# Patient Record
Sex: Female | Born: 1940 | Race: Black or African American | Hispanic: No | Marital: Single | Smoking: Former smoker
Health system: Southern US, Community
[De-identification: ages and names within clinical notes are randomized; demographics above are authoritative.]

## PROBLEM LIST (undated history)

## (undated) DIAGNOSIS — N289 Disorder of kidney and ureter, unspecified: Secondary | ICD-10-CM

## (undated) DIAGNOSIS — E785 Hyperlipidemia, unspecified: Secondary | ICD-10-CM

## (undated) DIAGNOSIS — I2510 Atherosclerotic heart disease of native coronary artery without angina pectoris: Secondary | ICD-10-CM

## (undated) DIAGNOSIS — C801 Malignant (primary) neoplasm, unspecified: Secondary | ICD-10-CM

## (undated) DIAGNOSIS — J189 Pneumonia, unspecified organism: Secondary | ICD-10-CM

## (undated) DIAGNOSIS — F419 Anxiety disorder, unspecified: Secondary | ICD-10-CM

## (undated) DIAGNOSIS — K589 Irritable bowel syndrome without diarrhea: Secondary | ICD-10-CM

## (undated) DIAGNOSIS — M1990 Unspecified osteoarthritis, unspecified site: Secondary | ICD-10-CM

## (undated) DIAGNOSIS — K224 Dyskinesia of esophagus: Secondary | ICD-10-CM

## (undated) DIAGNOSIS — I739 Peripheral vascular disease, unspecified: Secondary | ICD-10-CM

## (undated) DIAGNOSIS — G629 Polyneuropathy, unspecified: Secondary | ICD-10-CM

## (undated) DIAGNOSIS — I10 Essential (primary) hypertension: Secondary | ICD-10-CM

## (undated) DIAGNOSIS — F209 Schizophrenia, unspecified: Secondary | ICD-10-CM

## (undated) DIAGNOSIS — K219 Gastro-esophageal reflux disease without esophagitis: Secondary | ICD-10-CM

## (undated) DIAGNOSIS — E119 Type 2 diabetes mellitus without complications: Secondary | ICD-10-CM

## (undated) DIAGNOSIS — D509 Iron deficiency anemia, unspecified: Secondary | ICD-10-CM

## (undated) DIAGNOSIS — R55 Syncope and collapse: Secondary | ICD-10-CM

## (undated) DIAGNOSIS — F0390 Unspecified dementia without behavioral disturbance: Secondary | ICD-10-CM

## (undated) HISTORY — PX: JOINT REPLACEMENT: SHX530

## (undated) HISTORY — DX: Dyskinesia of esophagus: K22.4

## (undated) HISTORY — DX: Schizophrenia, unspecified: F20.9

## (undated) HISTORY — DX: Iron deficiency anemia, unspecified: D50.9

## (undated) HISTORY — DX: Syncope and collapse: R55

## (undated) HISTORY — DX: Type 2 diabetes mellitus without complications: E11.9

## (undated) HISTORY — DX: Atherosclerotic heart disease of native coronary artery without angina pectoris: I25.10

## (undated) HISTORY — PX: BREAST BIOPSY: SHX20

## (undated) HISTORY — DX: Polyneuropathy, unspecified: G62.9

## (undated) HISTORY — DX: Essential (primary) hypertension: I10

## (undated) HISTORY — DX: Irritable bowel syndrome, unspecified: K58.9

## (undated) HISTORY — DX: Disorder of kidney and ureter, unspecified: N28.9

## (undated) HISTORY — DX: Unspecified dementia, unspecified severity, without behavioral disturbance, psychotic disturbance, mood disturbance, and anxiety: F03.90

## (undated) HISTORY — PX: ABDOMINAL HYSTERECTOMY: SHX81

## (undated) HISTORY — PX: KNEE ARTHROPLASTY: SHX992

## (undated) HISTORY — DX: Peripheral vascular disease, unspecified: I73.9

## (undated) HISTORY — DX: Anxiety disorder, unspecified: F41.9

## (undated) HISTORY — DX: Gastro-esophageal reflux disease without esophagitis: K21.9

## (undated) HISTORY — DX: Hyperlipidemia, unspecified: E78.5

---

## 1993-04-22 HISTORY — PX: PTCA: SHX146

## 1997-06-24 ENCOUNTER — Ambulatory Visit (HOSPITAL_COMMUNITY): Admission: RE | Admit: 1997-06-24 | Discharge: 1997-06-24 | Payer: Self-pay | Admitting: Obstetrics and Gynecology

## 1998-04-27 ENCOUNTER — Other Ambulatory Visit: Admission: RE | Admit: 1998-04-27 | Discharge: 1998-04-27 | Payer: Self-pay | Admitting: Obstetrics and Gynecology

## 1998-06-26 ENCOUNTER — Ambulatory Visit (HOSPITAL_COMMUNITY): Admission: RE | Admit: 1998-06-26 | Discharge: 1998-06-26 | Payer: Self-pay

## 1998-06-26 ENCOUNTER — Encounter: Payer: Self-pay | Admitting: Obstetrics and Gynecology

## 1999-04-23 ENCOUNTER — Encounter: Payer: Self-pay | Admitting: Internal Medicine

## 1999-06-28 ENCOUNTER — Ambulatory Visit (HOSPITAL_COMMUNITY): Admission: RE | Admit: 1999-06-28 | Discharge: 1999-06-28 | Payer: Self-pay | Admitting: Obstetrics and Gynecology

## 1999-06-28 ENCOUNTER — Encounter: Payer: Self-pay | Admitting: Obstetrics and Gynecology

## 1999-07-13 ENCOUNTER — Encounter: Admission: RE | Admit: 1999-07-13 | Discharge: 1999-10-11 | Payer: Self-pay | Admitting: Internal Medicine

## 2000-04-29 ENCOUNTER — Other Ambulatory Visit: Admission: RE | Admit: 2000-04-29 | Discharge: 2000-04-29 | Payer: Self-pay | Admitting: Obstetrics and Gynecology

## 2000-06-30 ENCOUNTER — Encounter: Payer: Self-pay | Admitting: Obstetrics and Gynecology

## 2000-06-30 ENCOUNTER — Ambulatory Visit (HOSPITAL_COMMUNITY): Admission: RE | Admit: 2000-06-30 | Discharge: 2000-06-30 | Payer: Self-pay | Admitting: Obstetrics and Gynecology

## 2001-05-05 ENCOUNTER — Other Ambulatory Visit: Admission: RE | Admit: 2001-05-05 | Discharge: 2001-05-05 | Payer: Self-pay | Admitting: Obstetrics and Gynecology

## 2001-07-01 ENCOUNTER — Encounter: Payer: Self-pay | Admitting: Obstetrics and Gynecology

## 2001-07-01 ENCOUNTER — Ambulatory Visit (HOSPITAL_COMMUNITY): Admission: RE | Admit: 2001-07-01 | Discharge: 2001-07-01 | Payer: Self-pay | Admitting: Obstetrics and Gynecology

## 2002-07-28 ENCOUNTER — Ambulatory Visit (HOSPITAL_COMMUNITY): Admission: RE | Admit: 2002-07-28 | Discharge: 2002-07-28 | Payer: Self-pay | Admitting: Obstetrics and Gynecology

## 2002-07-28 ENCOUNTER — Encounter: Payer: Self-pay | Admitting: Obstetrics and Gynecology

## 2002-10-14 ENCOUNTER — Encounter: Payer: Self-pay | Admitting: Orthopedic Surgery

## 2002-10-19 ENCOUNTER — Inpatient Hospital Stay (HOSPITAL_COMMUNITY): Admission: RE | Admit: 2002-10-19 | Discharge: 2002-10-25 | Payer: Self-pay | Admitting: Orthopedic Surgery

## 2002-10-21 ENCOUNTER — Encounter: Payer: Self-pay | Admitting: Orthopedic Surgery

## 2002-10-23 ENCOUNTER — Encounter: Payer: Self-pay | Admitting: Orthopedic Surgery

## 2002-10-25 ENCOUNTER — Inpatient Hospital Stay (HOSPITAL_COMMUNITY)
Admission: RE | Admit: 2002-10-25 | Discharge: 2002-11-01 | Payer: Self-pay | Admitting: Physical Medicine & Rehabilitation

## 2002-10-27 ENCOUNTER — Encounter: Payer: Self-pay | Admitting: Orthopedic Surgery

## 2002-11-11 ENCOUNTER — Encounter: Admission: RE | Admit: 2002-11-11 | Discharge: 2003-02-09 | Payer: Self-pay | Admitting: Orthopedic Surgery

## 2002-12-11 ENCOUNTER — Inpatient Hospital Stay (HOSPITAL_COMMUNITY): Admission: EM | Admit: 2002-12-11 | Discharge: 2002-12-14 | Payer: Self-pay | Admitting: Emergency Medicine

## 2002-12-11 ENCOUNTER — Encounter: Payer: Self-pay | Admitting: Emergency Medicine

## 2002-12-13 ENCOUNTER — Encounter: Payer: Self-pay | Admitting: Cardiology

## 2002-12-13 ENCOUNTER — Encounter: Payer: Self-pay | Admitting: Internal Medicine

## 2003-03-21 ENCOUNTER — Inpatient Hospital Stay (HOSPITAL_COMMUNITY): Admission: EM | Admit: 2003-03-21 | Discharge: 2003-03-29 | Payer: Self-pay | Admitting: Psychiatry

## 2003-03-22 ENCOUNTER — Encounter: Payer: Self-pay | Admitting: Emergency Medicine

## 2003-03-28 ENCOUNTER — Encounter: Payer: Self-pay | Admitting: Emergency Medicine

## 2003-04-01 ENCOUNTER — Encounter: Payer: Self-pay | Admitting: Cardiology

## 2003-04-01 ENCOUNTER — Inpatient Hospital Stay (HOSPITAL_COMMUNITY): Admission: EM | Admit: 2003-04-01 | Discharge: 2003-04-04 | Payer: Self-pay | Admitting: Emergency Medicine

## 2003-08-01 ENCOUNTER — Ambulatory Visit (HOSPITAL_COMMUNITY): Admission: RE | Admit: 2003-08-01 | Discharge: 2003-08-01 | Payer: Self-pay | Admitting: Obstetrics and Gynecology

## 2003-12-08 ENCOUNTER — Inpatient Hospital Stay (HOSPITAL_COMMUNITY): Admission: EM | Admit: 2003-12-08 | Discharge: 2003-12-10 | Payer: Self-pay | Admitting: Emergency Medicine

## 2004-05-09 ENCOUNTER — Ambulatory Visit: Payer: Self-pay | Admitting: Internal Medicine

## 2004-06-19 ENCOUNTER — Ambulatory Visit: Payer: Self-pay | Admitting: Internal Medicine

## 2004-08-28 ENCOUNTER — Inpatient Hospital Stay (HOSPITAL_COMMUNITY): Admission: RE | Admit: 2004-08-28 | Discharge: 2004-09-03 | Payer: Self-pay | Admitting: Orthopedic Surgery

## 2004-08-31 ENCOUNTER — Ambulatory Visit: Payer: Self-pay | Admitting: Internal Medicine

## 2004-10-01 ENCOUNTER — Encounter: Admission: RE | Admit: 2004-10-01 | Discharge: 2004-12-30 | Payer: Self-pay | Admitting: Orthopedic Surgery

## 2004-10-09 ENCOUNTER — Other Ambulatory Visit: Admission: RE | Admit: 2004-10-09 | Discharge: 2004-10-09 | Payer: Self-pay | Admitting: Obstetrics and Gynecology

## 2004-11-29 ENCOUNTER — Ambulatory Visit: Payer: Self-pay | Admitting: Internal Medicine

## 2004-12-10 ENCOUNTER — Ambulatory Visit: Payer: Self-pay | Admitting: Internal Medicine

## 2005-01-27 ENCOUNTER — Encounter: Admission: RE | Admit: 2005-01-27 | Discharge: 2005-01-27 | Payer: Self-pay | Admitting: Neurology

## 2005-02-28 ENCOUNTER — Ambulatory Visit (HOSPITAL_COMMUNITY): Admission: RE | Admit: 2005-02-28 | Discharge: 2005-02-28 | Payer: Self-pay | Admitting: Obstetrics and Gynecology

## 2005-04-25 ENCOUNTER — Ambulatory Visit: Payer: Self-pay | Admitting: Internal Medicine

## 2005-05-13 ENCOUNTER — Ambulatory Visit (HOSPITAL_COMMUNITY): Admission: RE | Admit: 2005-05-13 | Discharge: 2005-05-13 | Payer: Self-pay | Admitting: Internal Medicine

## 2005-05-15 ENCOUNTER — Ambulatory Visit: Payer: Self-pay | Admitting: Internal Medicine

## 2005-06-13 ENCOUNTER — Ambulatory Visit: Payer: Self-pay | Admitting: Internal Medicine

## 2005-08-23 ENCOUNTER — Ambulatory Visit: Payer: Self-pay | Admitting: Internal Medicine

## 2005-08-29 ENCOUNTER — Ambulatory Visit: Payer: Self-pay | Admitting: Internal Medicine

## 2005-10-10 ENCOUNTER — Other Ambulatory Visit: Admission: RE | Admit: 2005-10-10 | Discharge: 2005-10-10 | Payer: Self-pay | Admitting: Obstetrics and Gynecology

## 2005-11-01 ENCOUNTER — Ambulatory Visit: Payer: Self-pay | Admitting: Internal Medicine

## 2005-12-12 ENCOUNTER — Ambulatory Visit: Payer: Self-pay | Admitting: Internal Medicine

## 2006-01-09 ENCOUNTER — Ambulatory Visit: Payer: Self-pay | Admitting: Internal Medicine

## 2006-01-10 ENCOUNTER — Inpatient Hospital Stay (HOSPITAL_COMMUNITY): Admission: EM | Admit: 2006-01-10 | Discharge: 2006-01-16 | Payer: Self-pay | Admitting: Emergency Medicine

## 2006-01-13 ENCOUNTER — Ambulatory Visit: Payer: Self-pay | Admitting: Internal Medicine

## 2006-01-23 ENCOUNTER — Ambulatory Visit: Payer: Self-pay | Admitting: Internal Medicine

## 2006-01-27 ENCOUNTER — Ambulatory Visit: Payer: Self-pay | Admitting: Internal Medicine

## 2006-01-28 ENCOUNTER — Ambulatory Visit: Payer: Self-pay | Admitting: Internal Medicine

## 2006-01-28 ENCOUNTER — Ambulatory Visit: Admission: RE | Admit: 2006-01-28 | Discharge: 2006-01-28 | Payer: Self-pay | Admitting: Internal Medicine

## 2006-01-30 ENCOUNTER — Ambulatory Visit (HOSPITAL_COMMUNITY): Admission: RE | Admit: 2006-01-30 | Discharge: 2006-01-30 | Payer: Self-pay | Admitting: Internal Medicine

## 2006-01-31 ENCOUNTER — Ambulatory Visit: Payer: Self-pay | Admitting: Internal Medicine

## 2006-02-03 ENCOUNTER — Ambulatory Visit (HOSPITAL_COMMUNITY): Admission: RE | Admit: 2006-02-03 | Discharge: 2006-02-03 | Payer: Self-pay | Admitting: Internal Medicine

## 2006-02-13 ENCOUNTER — Ambulatory Visit: Payer: Self-pay | Admitting: Internal Medicine

## 2006-03-04 ENCOUNTER — Ambulatory Visit (HOSPITAL_COMMUNITY): Admission: RE | Admit: 2006-03-04 | Discharge: 2006-03-04 | Payer: Self-pay | Admitting: Obstetrics and Gynecology

## 2006-05-27 ENCOUNTER — Ambulatory Visit: Payer: Self-pay | Admitting: Internal Medicine

## 2006-05-27 LAB — CONVERTED CEMR LAB
AST: 22 units/L (ref 0–37)
Basophils Absolute: 0.1 10*3/uL (ref 0.0–0.1)
Cholesterol: 267 mg/dL (ref 0–200)
Direct LDL: 127.2 mg/dL
Eosinophils Absolute: 0 10*3/uL (ref 0.0–0.6)
Eosinophils Relative: 0.3 % (ref 0.0–5.0)
Glucose, Bld: 208 mg/dL — ABNORMAL HIGH (ref 70–99)
HCT: 36.6 % (ref 36.0–46.0)
HDL: 120.3 mg/dL (ref >39.0)
Hgb A1c MFr Bld: 8 % — ABNORMAL HIGH (ref 4.6–6.0)
Ketones, ur: NEGATIVE mg/dL
Lymphocytes Relative: 24.2 % (ref 12.0–46.0)
Monocytes Absolute: 0.6 10*3/uL (ref 0.2–0.7)
Neutrophils Relative %: 68.2 % (ref 43.0–77.0)
Nitrite: NEGATIVE
Platelets: 322 10*3/uL (ref 150–400)
Urine Glucose: 250 mg/dL — AB
Vitamin B-12: 1106 pg/mL — ABNORMAL HIGH (ref 211–911)

## 2006-06-24 ENCOUNTER — Ambulatory Visit: Payer: Self-pay | Admitting: Internal Medicine

## 2006-06-26 ENCOUNTER — Encounter: Payer: Self-pay | Admitting: Internal Medicine

## 2006-06-26 LAB — CONVERTED CEMR LAB: Creatinine 24 HR UR: 556 mg/24hr — ABNORMAL LOW (ref 700–1800)

## 2006-09-04 ENCOUNTER — Encounter: Payer: Self-pay | Admitting: Internal Medicine

## 2006-09-04 DIAGNOSIS — D509 Iron deficiency anemia, unspecified: Secondary | ICD-10-CM

## 2006-09-04 DIAGNOSIS — K219 Gastro-esophageal reflux disease without esophagitis: Secondary | ICD-10-CM | POA: Insufficient documentation

## 2006-09-04 DIAGNOSIS — I10 Essential (primary) hypertension: Secondary | ICD-10-CM | POA: Insufficient documentation

## 2006-09-04 DIAGNOSIS — I2510 Atherosclerotic heart disease of native coronary artery without angina pectoris: Secondary | ICD-10-CM | POA: Insufficient documentation

## 2006-09-04 DIAGNOSIS — Z8659 Personal history of other mental and behavioral disorders: Secondary | ICD-10-CM

## 2006-09-04 DIAGNOSIS — G609 Hereditary and idiopathic neuropathy, unspecified: Secondary | ICD-10-CM | POA: Insufficient documentation

## 2006-09-04 DIAGNOSIS — E119 Type 2 diabetes mellitus without complications: Secondary | ICD-10-CM | POA: Insufficient documentation

## 2006-09-04 DIAGNOSIS — F411 Generalized anxiety disorder: Secondary | ICD-10-CM | POA: Insufficient documentation

## 2006-09-12 ENCOUNTER — Inpatient Hospital Stay (HOSPITAL_COMMUNITY): Admission: EM | Admit: 2006-09-12 | Discharge: 2006-09-13 | Payer: Self-pay | Admitting: Emergency Medicine

## 2006-09-12 ENCOUNTER — Ambulatory Visit: Payer: Self-pay | Admitting: Internal Medicine

## 2006-09-12 ENCOUNTER — Ambulatory Visit: Payer: Self-pay | Admitting: Cardiology

## 2006-09-27 ENCOUNTER — Emergency Department (HOSPITAL_COMMUNITY): Admission: EM | Admit: 2006-09-27 | Discharge: 2006-09-27 | Payer: Self-pay | Admitting: Emergency Medicine

## 2006-10-02 ENCOUNTER — Ambulatory Visit: Payer: Self-pay | Admitting: Internal Medicine

## 2006-12-19 ENCOUNTER — Ambulatory Visit: Payer: Self-pay | Admitting: Internal Medicine

## 2006-12-19 LAB — CONVERTED CEMR LAB
Basophils Relative: 0 % (ref 0.0–1.0)
Calcium: 10.6 mg/dL — ABNORMAL HIGH (ref 8.4–10.5)
Eosinophils Absolute: 0.1 10*3/uL (ref 0.0–0.6)
Glucose, Bld: 82 mg/dL (ref 70–99)
Lymphocytes Relative: 27.2 % (ref 12.0–46.0)
MCV: 90.2 fL (ref 78.0–100.0)
Neutro Abs: 8.6 10*3/uL — ABNORMAL HIGH (ref 1.4–7.7)
Neutrophils Relative %: 70 % (ref 43.0–77.0)
TSH: 1.14 microintl units/mL (ref 0.35–5.50)
Valproic Acid Lvl: 23.6 ug/mL — ABNORMAL LOW (ref 50.0–100.0)
Vitamin B-12: >1500 pg/mL — ABNORMAL HIGH (ref 211–911)
WBC: 12.2 10*3/uL — ABNORMAL HIGH (ref 4.5–10.5)

## 2006-12-29 ENCOUNTER — Inpatient Hospital Stay (HOSPITAL_COMMUNITY): Admission: AD | Admit: 2006-12-29 | Discharge: 2007-01-05 | Payer: Self-pay | Admitting: Psychiatry

## 2006-12-29 ENCOUNTER — Ambulatory Visit: Payer: Self-pay | Admitting: Psychiatry

## 2007-01-12 ENCOUNTER — Ambulatory Visit: Payer: Self-pay | Admitting: Internal Medicine

## 2007-01-19 ENCOUNTER — Encounter: Payer: Self-pay | Admitting: Internal Medicine

## 2007-01-22 ENCOUNTER — Ambulatory Visit: Payer: Self-pay | Admitting: Internal Medicine

## 2007-01-23 ENCOUNTER — Encounter: Payer: Self-pay | Admitting: Internal Medicine

## 2007-01-23 ENCOUNTER — Ambulatory Visit: Payer: Self-pay | Admitting: Internal Medicine

## 2007-02-11 ENCOUNTER — Ambulatory Visit: Payer: Self-pay | Admitting: Internal Medicine

## 2007-03-03 ENCOUNTER — Telehealth: Payer: Self-pay | Admitting: Internal Medicine

## 2007-03-04 ENCOUNTER — Telehealth: Payer: Self-pay | Admitting: Internal Medicine

## 2007-03-18 ENCOUNTER — Ambulatory Visit (HOSPITAL_COMMUNITY): Admission: RE | Admit: 2007-03-18 | Discharge: 2007-03-18 | Payer: Self-pay | Admitting: Obstetrics and Gynecology

## 2007-03-20 ENCOUNTER — Telehealth: Payer: Self-pay | Admitting: Internal Medicine

## 2007-03-26 ENCOUNTER — Encounter: Payer: Self-pay | Admitting: Internal Medicine

## 2007-04-28 ENCOUNTER — Ambulatory Visit: Payer: Self-pay | Admitting: Internal Medicine

## 2007-04-30 ENCOUNTER — Telehealth: Payer: Self-pay | Admitting: Internal Medicine

## 2007-05-05 ENCOUNTER — Telehealth: Payer: Self-pay | Admitting: Internal Medicine

## 2007-06-02 ENCOUNTER — Ambulatory Visit: Payer: Self-pay | Admitting: Internal Medicine

## 2007-06-02 DIAGNOSIS — E785 Hyperlipidemia, unspecified: Secondary | ICD-10-CM | POA: Insufficient documentation

## 2007-06-02 DIAGNOSIS — R609 Edema, unspecified: Secondary | ICD-10-CM

## 2007-06-02 DIAGNOSIS — F0390 Unspecified dementia without behavioral disturbance: Secondary | ICD-10-CM | POA: Insufficient documentation

## 2007-06-02 DIAGNOSIS — N390 Urinary tract infection, site not specified: Secondary | ICD-10-CM

## 2007-06-02 DIAGNOSIS — J309 Allergic rhinitis, unspecified: Secondary | ICD-10-CM | POA: Insufficient documentation

## 2007-06-02 DIAGNOSIS — R5383 Other fatigue: Secondary | ICD-10-CM

## 2007-06-02 DIAGNOSIS — K589 Irritable bowel syndrome without diarrhea: Secondary | ICD-10-CM

## 2007-06-02 DIAGNOSIS — F200 Paranoid schizophrenia: Secondary | ICD-10-CM | POA: Insufficient documentation

## 2007-06-02 DIAGNOSIS — R5381 Other malaise: Secondary | ICD-10-CM | POA: Insufficient documentation

## 2007-06-02 DIAGNOSIS — I739 Peripheral vascular disease, unspecified: Secondary | ICD-10-CM | POA: Insufficient documentation

## 2007-06-03 LAB — CONVERTED CEMR LAB
ALT: 24 units/L (ref 0–35)
Albumin: 3.7 g/dL (ref 3.5–5.2)
Basophils Relative: 0.6 % (ref 0.0–1.0)
Bilirubin Urine: NEGATIVE
Bilirubin, Direct: 0.1 mg/dL (ref 0.0–0.3)
Calcium: 9.9 mg/dL (ref 8.4–10.5)
Cholesterol: 173 mg/dL (ref 0–200)
GFR calc Af Amer: 71 mL/min
GFR calc non Af Amer: 59 mL/min
HCT: 36.1 % (ref 36.0–46.0)
HDL: 106.4 mg/dL (ref >39.0)
Hemoglobin: 11.9 g/dL — ABNORMAL LOW (ref 12.0–15.0)
Lymphocytes Relative: 32.8 % (ref 12.0–46.0)
MCHC: 33.1 g/dL (ref 30.0–36.0)
Microalb Creat Ratio: 105.4 mg/g — ABNORMAL HIGH (ref 0.0–30.0)
Monocytes Relative: 6.4 % (ref 3.0–11.0)
Nitrite: POSITIVE — AB
RBC: 3.95 M/uL (ref 3.87–5.11)
RDW: 13 % (ref 11.5–14.6)
Sodium: 141 meq/L (ref 135–145)
Specific Gravity, Urine: >=1.03 (ref 1.000–1.03)
TSH: 1.23 microintl units/mL (ref 0.35–5.50)
Total CHOL/HDL Ratio: 1.6
Urine Glucose: NEGATIVE mg/dL
Urobilinogen, UA: 0.2 (ref 0.0–1.0)
VLDL: 15 mg/dL (ref 0–40)
WBC: 10.1 10*3/uL (ref 4.5–10.5)

## 2007-06-25 ENCOUNTER — Telehealth: Payer: Self-pay | Admitting: Internal Medicine

## 2007-06-30 ENCOUNTER — Telehealth: Payer: Self-pay | Admitting: Internal Medicine

## 2007-06-30 ENCOUNTER — Ambulatory Visit: Payer: Self-pay | Admitting: Internal Medicine

## 2007-06-30 DIAGNOSIS — N3000 Acute cystitis without hematuria: Secondary | ICD-10-CM

## 2007-06-30 LAB — CONVERTED CEMR LAB
Crystals: NEGATIVE
Hemoglobin, Urine: NEGATIVE
Ketones, ur: NEGATIVE mg/dL
Nitrite: NEGATIVE
RBC / HPF: NONE SEEN
Specific Gravity, Urine: >=1.03 (ref 1.000–1.03)
Urobilinogen, UA: 0.2 (ref 0.0–1.0)
pH: 6 (ref 5.0–8.0)

## 2007-07-06 ENCOUNTER — Telehealth: Payer: Self-pay | Admitting: Internal Medicine

## 2007-07-07 ENCOUNTER — Telehealth: Payer: Self-pay | Admitting: Internal Medicine

## 2007-07-08 ENCOUNTER — Ambulatory Visit: Payer: Self-pay | Admitting: Internal Medicine

## 2007-07-08 LAB — CONVERTED CEMR LAB
Hemoglobin, Urine: NEGATIVE
Ketones, ur: NEGATIVE mg/dL
Specific Gravity, Urine: 1.025 (ref 1.000–1.03)
Total Protein, Urine: NEGATIVE mg/dL

## 2007-07-11 ENCOUNTER — Telehealth: Payer: Self-pay | Admitting: Internal Medicine

## 2007-08-11 ENCOUNTER — Encounter: Payer: Self-pay | Admitting: Internal Medicine

## 2007-08-11 ENCOUNTER — Telehealth: Payer: Self-pay | Admitting: Internal Medicine

## 2007-10-06 ENCOUNTER — Telehealth: Payer: Self-pay | Admitting: Internal Medicine

## 2007-10-16 ENCOUNTER — Emergency Department (HOSPITAL_COMMUNITY): Admission: EM | Admit: 2007-10-16 | Discharge: 2007-10-17 | Payer: Self-pay | Admitting: Emergency Medicine

## 2007-10-19 ENCOUNTER — Telehealth: Payer: Self-pay | Admitting: Internal Medicine

## 2007-10-20 ENCOUNTER — Ambulatory Visit: Payer: Self-pay | Admitting: Internal Medicine

## 2007-10-20 LAB — CONVERTED CEMR LAB
Bilirubin Urine: NEGATIVE
Total Protein, Urine: NEGATIVE mg/dL
Urine Glucose: NEGATIVE mg/dL
pH: 6 (ref 5.0–8.0)

## 2007-10-22 ENCOUNTER — Telehealth: Payer: Self-pay | Admitting: Internal Medicine

## 2007-10-23 ENCOUNTER — Emergency Department (HOSPITAL_COMMUNITY): Admission: EM | Admit: 2007-10-23 | Discharge: 2007-10-23 | Payer: Self-pay | Admitting: Emergency Medicine

## 2007-10-26 ENCOUNTER — Ambulatory Visit: Payer: Self-pay | Admitting: Internal Medicine

## 2007-10-26 DIAGNOSIS — M549 Dorsalgia, unspecified: Secondary | ICD-10-CM

## 2007-10-26 DIAGNOSIS — G8929 Other chronic pain: Secondary | ICD-10-CM | POA: Insufficient documentation

## 2007-10-27 ENCOUNTER — Telehealth: Payer: Self-pay | Admitting: Internal Medicine

## 2007-10-27 ENCOUNTER — Encounter: Payer: Self-pay | Admitting: Internal Medicine

## 2007-10-28 ENCOUNTER — Telehealth: Payer: Self-pay | Admitting: Internal Medicine

## 2007-11-09 ENCOUNTER — Encounter: Admission: RE | Admit: 2007-11-09 | Discharge: 2007-11-09 | Payer: Self-pay | Admitting: Obstetrics and Gynecology

## 2007-11-10 ENCOUNTER — Telehealth: Payer: Self-pay | Admitting: Internal Medicine

## 2007-11-30 ENCOUNTER — Telehealth: Payer: Self-pay | Admitting: Internal Medicine

## 2007-12-01 ENCOUNTER — Telehealth: Payer: Self-pay | Admitting: Internal Medicine

## 2007-12-07 ENCOUNTER — Telehealth: Payer: Self-pay | Admitting: Internal Medicine

## 2007-12-17 ENCOUNTER — Ambulatory Visit: Payer: Self-pay | Admitting: Internal Medicine

## 2007-12-20 ENCOUNTER — Encounter: Payer: Self-pay | Admitting: Internal Medicine

## 2007-12-21 ENCOUNTER — Telehealth: Payer: Self-pay | Admitting: Internal Medicine

## 2007-12-21 ENCOUNTER — Encounter: Payer: Self-pay | Admitting: Internal Medicine

## 2007-12-22 ENCOUNTER — Telehealth (INDEPENDENT_AMBULATORY_CARE_PROVIDER_SITE_OTHER): Payer: Self-pay | Admitting: *Deleted

## 2007-12-24 ENCOUNTER — Ambulatory Visit: Payer: Self-pay | Admitting: Internal Medicine

## 2007-12-24 LAB — CONVERTED CEMR LAB
AST: 18 units/L (ref 0–37)
BUN: 11 mg/dL (ref 6–23)
Bilirubin, Direct: 0.1 mg/dL (ref 0.0–0.3)
Calcium: 10.2 mg/dL (ref 8.4–10.5)
Creatinine, Ser: 0.7 mg/dL (ref 0.4–1.2)
GFR calc Af Amer: 107 mL/min
GFR calc non Af Amer: 89 mL/min
Glucose, Bld: 133 mg/dL — ABNORMAL HIGH (ref 70–99)
HDL: 94.8 mg/dL (ref >39.0)
Sodium: 148 meq/L — ABNORMAL HIGH (ref 135–145)
TSH: 1.05 microintl units/mL (ref 0.35–5.50)
Total Protein: 7.9 g/dL (ref 6.0–8.3)
Triglycerides: 115 mg/dL (ref 0–149)
VLDL: 23 mg/dL (ref 0–40)

## 2007-12-25 ENCOUNTER — Telehealth: Payer: Self-pay | Admitting: Internal Medicine

## 2007-12-25 ENCOUNTER — Encounter: Payer: Self-pay | Admitting: Internal Medicine

## 2007-12-30 ENCOUNTER — Emergency Department (HOSPITAL_COMMUNITY): Admission: EM | Admit: 2007-12-30 | Discharge: 2007-12-31 | Payer: Self-pay | Admitting: Emergency Medicine

## 2007-12-31 ENCOUNTER — Encounter: Payer: Self-pay | Admitting: Internal Medicine

## 2008-01-01 ENCOUNTER — Telehealth: Payer: Self-pay | Admitting: Internal Medicine

## 2008-01-04 ENCOUNTER — Telehealth: Payer: Self-pay | Admitting: Internal Medicine

## 2008-01-19 ENCOUNTER — Encounter: Payer: Self-pay | Admitting: Internal Medicine

## 2008-01-22 ENCOUNTER — Telehealth (INDEPENDENT_AMBULATORY_CARE_PROVIDER_SITE_OTHER): Payer: Self-pay | Admitting: *Deleted

## 2008-01-25 ENCOUNTER — Encounter: Payer: Self-pay | Admitting: Internal Medicine

## 2008-01-27 ENCOUNTER — Telehealth: Payer: Self-pay | Admitting: Internal Medicine

## 2008-02-04 ENCOUNTER — Encounter: Admission: RE | Admit: 2008-02-04 | Discharge: 2008-02-04 | Payer: Self-pay | Admitting: Family Medicine

## 2008-02-12 ENCOUNTER — Ambulatory Visit: Payer: Self-pay | Admitting: Internal Medicine

## 2008-02-15 ENCOUNTER — Encounter: Payer: Self-pay | Admitting: Internal Medicine

## 2008-02-22 ENCOUNTER — Encounter: Payer: Self-pay | Admitting: Internal Medicine

## 2008-02-26 ENCOUNTER — Telehealth: Payer: Self-pay | Admitting: Internal Medicine

## 2008-03-03 ENCOUNTER — Telehealth: Payer: Self-pay | Admitting: Internal Medicine

## 2008-03-25 ENCOUNTER — Telehealth: Payer: Self-pay | Admitting: Internal Medicine

## 2008-03-31 ENCOUNTER — Ambulatory Visit: Payer: Self-pay | Admitting: Internal Medicine

## 2008-04-07 ENCOUNTER — Encounter: Payer: Self-pay | Admitting: Internal Medicine

## 2008-04-18 ENCOUNTER — Ambulatory Visit: Payer: Self-pay | Admitting: Internal Medicine

## 2008-04-19 ENCOUNTER — Encounter: Payer: Self-pay | Admitting: Internal Medicine

## 2008-04-25 ENCOUNTER — Telehealth: Payer: Self-pay | Admitting: Internal Medicine

## 2008-04-28 ENCOUNTER — Encounter: Payer: Self-pay | Admitting: Internal Medicine

## 2008-05-04 ENCOUNTER — Encounter: Payer: Self-pay | Admitting: Internal Medicine

## 2008-05-04 LAB — CONVERTED CEMR LAB
Bilirubin Urine: NEGATIVE
Hemoglobin, Urine: NEGATIVE
Ketones, ur: NEGATIVE mg/dL
Leukocytes, UA: NEGATIVE
Urine Glucose: NEGATIVE mg/dL
Urobilinogen, UA: 0.2 (ref 0.0–1.0)

## 2008-05-05 ENCOUNTER — Telehealth: Payer: Self-pay | Admitting: Internal Medicine

## 2008-05-09 ENCOUNTER — Encounter: Payer: Self-pay | Admitting: Internal Medicine

## 2008-05-16 ENCOUNTER — Ambulatory Visit: Payer: Self-pay | Admitting: Internal Medicine

## 2008-05-19 ENCOUNTER — Encounter: Payer: Self-pay | Admitting: Internal Medicine

## 2008-05-29 ENCOUNTER — Encounter: Payer: Self-pay | Admitting: Internal Medicine

## 2008-05-30 ENCOUNTER — Telehealth: Payer: Self-pay | Admitting: Internal Medicine

## 2008-06-02 ENCOUNTER — Encounter: Payer: Self-pay | Admitting: Internal Medicine

## 2008-06-13 ENCOUNTER — Telehealth: Payer: Self-pay | Admitting: Internal Medicine

## 2008-06-13 ENCOUNTER — Encounter: Payer: Self-pay | Admitting: Internal Medicine

## 2008-06-20 ENCOUNTER — Telehealth: Payer: Self-pay | Admitting: Internal Medicine

## 2008-06-21 ENCOUNTER — Ambulatory Visit: Payer: Self-pay | Admitting: Internal Medicine

## 2008-06-22 ENCOUNTER — Ambulatory Visit: Payer: Self-pay | Admitting: Internal Medicine

## 2008-06-22 DIAGNOSIS — R42 Dizziness and giddiness: Secondary | ICD-10-CM | POA: Insufficient documentation

## 2008-06-22 LAB — CONVERTED CEMR LAB
Bilirubin Urine: NEGATIVE
Hemoglobin, Urine: NEGATIVE
Ketones, ur: NEGATIVE mg/dL
Total Protein, Urine: NEGATIVE mg/dL
Urine Glucose: NEGATIVE mg/dL
Urobilinogen, UA: 0.2 (ref 0.0–1.0)

## 2008-06-23 ENCOUNTER — Encounter: Payer: Self-pay | Admitting: Internal Medicine

## 2008-06-23 ENCOUNTER — Telehealth: Payer: Self-pay | Admitting: Internal Medicine

## 2008-06-23 LAB — CONVERTED CEMR LAB
Albumin: 4 g/dL (ref 3.5–5.2)
BUN: 16 mg/dL (ref 6–23)
Basophils Absolute: 0 10*3/uL (ref 0.0–0.1)
Creatinine, Ser: 0.7 mg/dL (ref 0.4–1.2)
Eosinophils Absolute: 0 10*3/uL (ref 0.0–0.7)
Eosinophils Relative: 0.4 % (ref 0.0–5.0)
GFR calc Af Amer: 107 mL/min
GFR calc non Af Amer: 89 mL/min
HCT: 37.6 % (ref 36.0–46.0)
MCHC: 33.4 g/dL (ref 30.0–36.0)
MCV: 92.1 fL (ref 78.0–100.0)
Monocytes Absolute: 0.4 10*3/uL (ref 0.1–1.0)
Platelets: 250 10*3/uL (ref 150–400)
Potassium: 4.2 meq/L (ref 3.5–5.1)
TSH: 0.66 microintl units/mL (ref 0.35–5.50)
Total Bilirubin: 0.7 mg/dL (ref 0.3–1.2)
WBC: 9.2 10*3/uL (ref 4.5–10.5)

## 2008-06-29 ENCOUNTER — Encounter: Payer: Self-pay | Admitting: Internal Medicine

## 2008-07-04 ENCOUNTER — Telehealth: Payer: Self-pay | Admitting: Internal Medicine

## 2008-07-11 ENCOUNTER — Telehealth: Payer: Self-pay | Admitting: Internal Medicine

## 2008-07-18 ENCOUNTER — Telehealth: Payer: Self-pay | Admitting: Internal Medicine

## 2008-07-19 ENCOUNTER — Encounter: Payer: Self-pay | Admitting: Internal Medicine

## 2008-07-25 ENCOUNTER — Telehealth: Payer: Self-pay | Admitting: Internal Medicine

## 2008-07-28 ENCOUNTER — Encounter: Payer: Self-pay | Admitting: Internal Medicine

## 2008-07-29 ENCOUNTER — Encounter: Payer: Self-pay | Admitting: Internal Medicine

## 2008-08-16 ENCOUNTER — Telehealth: Payer: Self-pay | Admitting: Internal Medicine

## 2008-08-22 ENCOUNTER — Ambulatory Visit: Payer: Self-pay | Admitting: Internal Medicine

## 2008-08-25 ENCOUNTER — Encounter: Payer: Self-pay | Admitting: Internal Medicine

## 2008-08-26 ENCOUNTER — Telehealth: Payer: Self-pay | Admitting: Internal Medicine

## 2008-10-12 ENCOUNTER — Encounter: Payer: Self-pay | Admitting: Internal Medicine

## 2008-10-17 ENCOUNTER — Telehealth (INDEPENDENT_AMBULATORY_CARE_PROVIDER_SITE_OTHER): Payer: Self-pay | Admitting: *Deleted

## 2008-10-25 ENCOUNTER — Ambulatory Visit: Payer: Self-pay | Admitting: Internal Medicine

## 2008-12-14 ENCOUNTER — Encounter: Payer: Self-pay | Admitting: Internal Medicine

## 2008-12-15 ENCOUNTER — Telehealth: Payer: Self-pay | Admitting: Internal Medicine

## 2008-12-30 ENCOUNTER — Telehealth: Payer: Self-pay | Admitting: Internal Medicine

## 2008-12-30 ENCOUNTER — Ambulatory Visit: Payer: Self-pay | Admitting: Internal Medicine

## 2008-12-30 LAB — CONVERTED CEMR LAB
ALT: 18 units/L (ref 0–35)
AST: 16 units/L (ref 0–37)
Albumin: 3.9 g/dL (ref 3.5–5.2)
Alkaline Phosphatase: 100 units/L (ref 39–117)
Calcium: 9.9 mg/dL (ref 8.4–10.5)
Cholesterol: 192 mg/dL (ref 0–200)
Creatinine, Ser: 0.8 mg/dL (ref 0.4–1.2)
GFR calc non Af Amer: 91.71 mL/min (ref >60)
Hgb A1c MFr Bld: 7.9 % — ABNORMAL HIGH (ref 4.6–6.5)
Total Protein: 7.3 g/dL (ref 6.0–8.3)
Triglycerides: 61 mg/dL (ref 0.0–149.0)

## 2009-01-14 ENCOUNTER — Encounter: Payer: Self-pay | Admitting: Internal Medicine

## 2009-01-16 ENCOUNTER — Telehealth: Payer: Self-pay | Admitting: Internal Medicine

## 2009-01-20 ENCOUNTER — Ambulatory Visit: Payer: Self-pay | Admitting: Internal Medicine

## 2009-01-23 ENCOUNTER — Inpatient Hospital Stay (HOSPITAL_COMMUNITY): Admission: EM | Admit: 2009-01-23 | Discharge: 2009-01-28 | Payer: Self-pay | Admitting: Emergency Medicine

## 2009-01-23 ENCOUNTER — Ambulatory Visit: Payer: Self-pay | Admitting: Internal Medicine

## 2009-01-25 ENCOUNTER — Telehealth: Payer: Self-pay | Admitting: Internal Medicine

## 2009-01-31 ENCOUNTER — Telehealth (INDEPENDENT_AMBULATORY_CARE_PROVIDER_SITE_OTHER): Payer: Self-pay | Admitting: *Deleted

## 2009-02-03 ENCOUNTER — Telehealth: Payer: Self-pay | Admitting: Internal Medicine

## 2009-02-03 ENCOUNTER — Ambulatory Visit: Payer: Self-pay | Admitting: Internal Medicine

## 2009-02-03 DIAGNOSIS — E860 Dehydration: Secondary | ICD-10-CM

## 2009-02-03 DIAGNOSIS — R112 Nausea with vomiting, unspecified: Secondary | ICD-10-CM | POA: Insufficient documentation

## 2009-02-03 DIAGNOSIS — B370 Candidal stomatitis: Secondary | ICD-10-CM

## 2009-02-03 DIAGNOSIS — Z87891 Personal history of nicotine dependence: Secondary | ICD-10-CM

## 2009-02-03 DIAGNOSIS — R110 Nausea: Secondary | ICD-10-CM

## 2009-02-05 DIAGNOSIS — N259 Disorder resulting from impaired renal tubular function, unspecified: Secondary | ICD-10-CM | POA: Insufficient documentation

## 2009-02-05 LAB — CONVERTED CEMR LAB
Calcium: 8.3 mg/dL — ABNORMAL LOW (ref 8.4–10.5)
GFR calc non Af Amer: 10.81 mL/min (ref >60)
Glucose, Bld: 335 mg/dL — ABNORMAL HIGH (ref 70–99)
Sodium: 138 meq/L (ref 135–145)
TSH: 0.58 microintl units/mL (ref 0.35–5.50)

## 2009-02-06 ENCOUNTER — Encounter: Payer: Self-pay | Admitting: Internal Medicine

## 2009-02-07 ENCOUNTER — Ambulatory Visit: Payer: Self-pay | Admitting: Internal Medicine

## 2009-02-07 ENCOUNTER — Inpatient Hospital Stay (HOSPITAL_COMMUNITY): Admission: AD | Admit: 2009-02-07 | Discharge: 2009-02-15 | Payer: Self-pay | Admitting: Internal Medicine

## 2009-02-07 ENCOUNTER — Encounter: Payer: Self-pay | Admitting: Internal Medicine

## 2009-02-07 DIAGNOSIS — R1314 Dysphagia, pharyngoesophageal phase: Secondary | ICD-10-CM

## 2009-02-07 DIAGNOSIS — N179 Acute kidney failure, unspecified: Secondary | ICD-10-CM

## 2009-02-08 ENCOUNTER — Telehealth: Payer: Self-pay | Admitting: Internal Medicine

## 2009-02-09 ENCOUNTER — Telehealth: Payer: Self-pay | Admitting: Internal Medicine

## 2009-02-11 ENCOUNTER — Encounter: Payer: Self-pay | Admitting: Internal Medicine

## 2009-02-15 ENCOUNTER — Encounter (INDEPENDENT_AMBULATORY_CARE_PROVIDER_SITE_OTHER): Payer: Self-pay | Admitting: *Deleted

## 2009-02-15 ENCOUNTER — Telehealth (INDEPENDENT_AMBULATORY_CARE_PROVIDER_SITE_OTHER): Payer: Self-pay | Admitting: *Deleted

## 2009-03-08 ENCOUNTER — Telehealth (INDEPENDENT_AMBULATORY_CARE_PROVIDER_SITE_OTHER): Payer: Self-pay | Admitting: *Deleted

## 2009-03-09 ENCOUNTER — Encounter: Payer: Self-pay | Admitting: Internal Medicine

## 2009-03-20 ENCOUNTER — Ambulatory Visit: Payer: Self-pay | Admitting: Internal Medicine

## 2009-03-20 LAB — CONVERTED CEMR LAB
Calcium: 9.7 mg/dL (ref 8.4–10.5)
GFR calc non Af Amer: 63.47 mL/min (ref >60)
Glucose, Bld: 258 mg/dL — ABNORMAL HIGH (ref 70–99)
Sodium: 142 meq/L (ref 135–145)

## 2009-03-21 ENCOUNTER — Telehealth: Payer: Self-pay | Admitting: Internal Medicine

## 2009-03-21 ENCOUNTER — Ambulatory Visit: Payer: Self-pay | Admitting: Oncology

## 2009-03-24 ENCOUNTER — Ambulatory Visit (HOSPITAL_COMMUNITY): Admission: RE | Admit: 2009-03-24 | Discharge: 2009-03-24 | Payer: Self-pay | Admitting: Obstetrics and Gynecology

## 2009-03-24 ENCOUNTER — Encounter: Payer: Self-pay | Admitting: Internal Medicine

## 2009-03-24 LAB — HM MAMMOGRAPHY

## 2009-03-25 ENCOUNTER — Encounter: Payer: Self-pay | Admitting: Internal Medicine

## 2009-03-26 ENCOUNTER — Telehealth: Payer: Self-pay | Admitting: Family Medicine

## 2009-03-27 ENCOUNTER — Telehealth: Payer: Self-pay | Admitting: Internal Medicine

## 2009-03-29 ENCOUNTER — Encounter: Payer: Self-pay | Admitting: Internal Medicine

## 2009-03-29 LAB — CBC & DIFF AND RETIC
BASO%: 0.4 % (ref 0.0–2.0)
EOS%: 0.6 % (ref 0.0–7.0)
HCT: 33.5 % — ABNORMAL LOW (ref 34.8–46.6)
LYMPH%: 38.9 % (ref 14.0–49.7)
MCH: 29.9 pg (ref 25.1–34.0)
MCHC: 31.3 g/dL — ABNORMAL LOW (ref 31.5–36.0)
MONO#: 0.5 10*3/uL (ref 0.1–0.9)
NEUT%: 54 % (ref 38.4–76.8)
Platelets: 266 10*3/uL (ref 145–400)
RBC: 3.51 10*6/uL — ABNORMAL LOW (ref 3.70–5.45)
Retic %: 1.39 % (ref 0.50–1.50)
WBC: 7.9 10*3/uL (ref 3.9–10.3)
lymph#: 3.1 10*3/uL (ref 0.9–3.3)

## 2009-03-29 LAB — COMPREHENSIVE METABOLIC PANEL
Alkaline Phosphatase: 82 U/L (ref 39–117)
CO2: 21 mEq/L (ref 19–32)
Creatinine, Ser: 0.95 mg/dL (ref 0.40–1.20)
Glucose, Bld: 138 mg/dL — ABNORMAL HIGH (ref 70–99)
Total Bilirubin: 0.5 mg/dL (ref 0.3–1.2)

## 2009-03-29 LAB — MORPHOLOGY: PLT EST: ADEQUATE

## 2009-03-29 LAB — LACTATE DEHYDROGENASE: LDH: 155 U/L (ref 94–250)

## 2009-03-31 LAB — IMMUNOFIXATION ELECTROPHORESIS
IgA: 112 mg/dL (ref 68–378)
IgG (Immunoglobin G), Serum: 1690 mg/dL — ABNORMAL HIGH (ref 694–1618)

## 2009-03-31 LAB — KAPPA/LAMBDA LIGHT CHAINS
Kappa free light chain: 1.83 mg/dL (ref 0.33–1.94)
Lambda Free Lght Chn: 0.67 mg/dL (ref 0.57–2.63)

## 2009-04-04 ENCOUNTER — Encounter: Admission: RE | Admit: 2009-04-04 | Discharge: 2009-04-04 | Payer: Self-pay | Admitting: Obstetrics and Gynecology

## 2009-04-10 ENCOUNTER — Telehealth: Payer: Self-pay | Admitting: Internal Medicine

## 2009-04-19 ENCOUNTER — Telehealth: Payer: Self-pay | Admitting: Internal Medicine

## 2009-05-03 ENCOUNTER — Ambulatory Visit: Payer: Self-pay | Admitting: Oncology

## 2009-05-03 ENCOUNTER — Telehealth: Payer: Self-pay | Admitting: Internal Medicine

## 2009-05-05 ENCOUNTER — Telehealth: Payer: Self-pay | Admitting: Internal Medicine

## 2009-05-05 ENCOUNTER — Encounter: Payer: Self-pay | Admitting: Internal Medicine

## 2009-05-05 LAB — CBC WITH DIFFERENTIAL/PLATELET
BASO%: 0.2 % (ref 0.0–2.0)
Basophils Absolute: 0 10e3/uL (ref 0.0–0.1)
EOS%: 0.4 % (ref 0.0–7.0)
Eosinophils Absolute: 0 10e3/uL (ref 0.0–0.5)
HCT: 35 % (ref 34.8–46.6)
HGB: 11 g/dL — ABNORMAL LOW (ref 11.6–15.9)
LYMPH%: 32.2 % (ref 14.0–49.7)
MCH: 29.9 pg (ref 25.1–34.0)
MCHC: 31.4 g/dL — ABNORMAL LOW (ref 31.5–36.0)
MCV: 95.1 fL (ref 79.5–101.0)
MONO#: 0.5 10e3/uL (ref 0.1–0.9)
MONO%: 5.8 % (ref 0.0–14.0)
NEUT#: 5.5 10e3/uL (ref 1.5–6.5)
NEUT%: 61.4 % (ref 38.4–76.8)
Platelets: 229 10e3/uL (ref 145–400)
RBC: 3.68 10e6/uL — ABNORMAL LOW (ref 3.70–5.45)
RDW: 15.4 % — ABNORMAL HIGH (ref 11.2–14.5)
WBC: 8.9 10e3/uL (ref 3.9–10.3)
lymph#: 2.9 10e3/uL (ref 0.9–3.3)

## 2009-05-05 LAB — FERRITIN: Ferritin: 104 ng/mL (ref 10–291)

## 2009-05-18 ENCOUNTER — Ambulatory Visit: Payer: Self-pay | Admitting: Internal Medicine

## 2009-05-18 ENCOUNTER — Telehealth: Payer: Self-pay | Admitting: Internal Medicine

## 2009-05-18 LAB — CONVERTED CEMR LAB
ALT: 14 units/L (ref 0–35)
AST: 16 units/L (ref 0–37)
Albumin: 4.4 g/dL (ref 3.5–5.2)
Chloride: 105 meq/L (ref 96–112)
Cholesterol: 205 mg/dL — ABNORMAL HIGH (ref 0–200)
Creatinine, Ser: 0.9 mg/dL (ref 0.4–1.2)
Direct LDL: 50.9 mg/dL
Eosinophils Relative: 0.5 % (ref 0.0–5.0)
Hgb A1c MFr Bld: 7.1 % — ABNORMAL HIGH (ref 4.6–6.5)
Lymphocytes Relative: 33.3 % (ref 12.0–46.0)
Monocytes Relative: 3.9 % (ref 3.0–12.0)
Neutrophils Relative %: 58.9 % (ref 43.0–77.0)
Platelets: 239 10*3/uL (ref 150.0–400.0)
Total CHOL/HDL Ratio: 2
Total Protein: 8.4 g/dL — ABNORMAL HIGH (ref 6.0–8.3)
Triglycerides: 130 mg/dL (ref 0.0–149.0)
Vitamin B-12: >1500 pg/mL — ABNORMAL HIGH (ref 211–911)
WBC: 10.1 10*3/uL (ref 4.5–10.5)

## 2009-05-22 ENCOUNTER — Encounter: Payer: Self-pay | Admitting: Internal Medicine

## 2009-05-26 ENCOUNTER — Telehealth: Payer: Self-pay | Admitting: Internal Medicine

## 2009-06-27 ENCOUNTER — Encounter: Payer: Self-pay | Admitting: Internal Medicine

## 2009-07-10 ENCOUNTER — Telehealth: Payer: Self-pay | Admitting: Internal Medicine

## 2009-07-26 ENCOUNTER — Encounter: Payer: Self-pay | Admitting: Internal Medicine

## 2009-09-04 ENCOUNTER — Telehealth: Payer: Self-pay | Admitting: Internal Medicine

## 2009-09-28 ENCOUNTER — Telehealth: Payer: Self-pay | Admitting: Internal Medicine

## 2009-10-04 ENCOUNTER — Telehealth: Payer: Self-pay | Admitting: Internal Medicine

## 2009-10-04 ENCOUNTER — Ambulatory Visit: Payer: Self-pay | Admitting: Internal Medicine

## 2009-10-04 ENCOUNTER — Inpatient Hospital Stay (HOSPITAL_COMMUNITY): Admission: EM | Admit: 2009-10-04 | Discharge: 2009-10-08 | Payer: Self-pay | Admitting: Emergency Medicine

## 2009-10-06 ENCOUNTER — Telehealth: Payer: Self-pay | Admitting: Internal Medicine

## 2009-10-12 ENCOUNTER — Ambulatory Visit (HOSPITAL_COMMUNITY): Admission: RE | Admit: 2009-10-12 | Discharge: 2009-10-12 | Payer: Self-pay | Admitting: Internal Medicine

## 2009-10-12 ENCOUNTER — Ambulatory Visit: Payer: Self-pay | Admitting: Surgery

## 2009-10-12 ENCOUNTER — Telehealth: Payer: Self-pay | Admitting: Internal Medicine

## 2009-10-12 ENCOUNTER — Ambulatory Visit: Payer: Self-pay | Admitting: Internal Medicine

## 2009-10-12 ENCOUNTER — Encounter: Payer: Self-pay | Admitting: Internal Medicine

## 2009-10-16 ENCOUNTER — Telehealth: Payer: Self-pay | Admitting: Internal Medicine

## 2009-10-16 ENCOUNTER — Encounter: Payer: Self-pay | Admitting: Internal Medicine

## 2009-10-17 ENCOUNTER — Encounter: Payer: Self-pay | Admitting: Internal Medicine

## 2009-10-18 ENCOUNTER — Telehealth: Payer: Self-pay | Admitting: Internal Medicine

## 2009-10-19 ENCOUNTER — Encounter: Payer: Self-pay | Admitting: Internal Medicine

## 2009-10-20 ENCOUNTER — Telehealth: Payer: Self-pay | Admitting: Internal Medicine

## 2009-10-24 ENCOUNTER — Telehealth: Payer: Self-pay | Admitting: Internal Medicine

## 2009-10-27 ENCOUNTER — Encounter: Payer: Self-pay | Admitting: Internal Medicine

## 2009-10-27 ENCOUNTER — Telehealth: Payer: Self-pay | Admitting: Internal Medicine

## 2009-10-28 ENCOUNTER — Ambulatory Visit: Payer: Self-pay | Admitting: Internal Medicine

## 2009-11-01 ENCOUNTER — Ambulatory Visit: Payer: Self-pay | Admitting: Oncology

## 2009-11-02 ENCOUNTER — Telehealth: Payer: Self-pay | Admitting: Internal Medicine

## 2009-11-05 ENCOUNTER — Encounter: Payer: Self-pay | Admitting: Internal Medicine

## 2009-11-14 ENCOUNTER — Encounter: Payer: Self-pay | Admitting: Internal Medicine

## 2009-12-12 ENCOUNTER — Ambulatory Visit: Payer: Self-pay | Admitting: Internal Medicine

## 2009-12-12 ENCOUNTER — Telehealth: Payer: Self-pay | Admitting: Internal Medicine

## 2009-12-12 LAB — CONVERTED CEMR LAB
BUN: 16 mg/dL (ref 6–23)
Bilirubin Urine: NEGATIVE
CO2: 30 meq/L (ref 19–32)
Calcium: 10.3 mg/dL (ref 8.4–10.5)
Cholesterol: 205 mg/dL — ABNORMAL HIGH (ref 0–200)
Creatinine, Ser: 0.9 mg/dL (ref 0.4–1.2)
Hgb A1c MFr Bld: 8 % — ABNORMAL HIGH (ref 4.6–6.5)
Nitrite: NEGATIVE
Specific Gravity, Urine: <1.005
Total CHOL/HDL Ratio: 2
Urobilinogen, UA: 0.2
WBC Urine, dipstick: NEGATIVE

## 2009-12-13 ENCOUNTER — Telehealth (INDEPENDENT_AMBULATORY_CARE_PROVIDER_SITE_OTHER): Payer: Self-pay | Admitting: *Deleted

## 2009-12-20 ENCOUNTER — Telehealth: Payer: Self-pay | Admitting: Internal Medicine

## 2009-12-20 ENCOUNTER — Ambulatory Visit: Payer: Self-pay | Admitting: Internal Medicine

## 2009-12-21 ENCOUNTER — Telehealth: Payer: Self-pay | Admitting: Internal Medicine

## 2009-12-26 ENCOUNTER — Encounter: Payer: Self-pay | Admitting: Internal Medicine

## 2010-01-02 ENCOUNTER — Encounter: Payer: Self-pay | Admitting: Internal Medicine

## 2010-01-09 ENCOUNTER — Ambulatory Visit: Payer: Self-pay | Admitting: Internal Medicine

## 2010-01-09 ENCOUNTER — Telehealth: Payer: Self-pay | Admitting: Internal Medicine

## 2010-01-30 ENCOUNTER — Ambulatory Visit: Payer: Self-pay | Admitting: Internal Medicine

## 2010-02-15 ENCOUNTER — Telehealth: Payer: Self-pay | Admitting: Internal Medicine

## 2010-04-13 ENCOUNTER — Encounter: Payer: Self-pay | Admitting: Internal Medicine

## 2010-05-02 ENCOUNTER — Telehealth: Payer: Self-pay | Admitting: Internal Medicine

## 2010-05-13 ENCOUNTER — Encounter: Payer: Self-pay | Admitting: Obstetrics and Gynecology

## 2010-05-22 NOTE — Progress Notes (Signed)
Summary: LAB  Phone Note From Other Clinic   Caller: Patsy Lager w/[facility] Summary of Call: Called stating that they failed to draw a CBC w/diff will patient was there and wants MD to order and send pt to the lab if possible. Initial call taken by: Rock Nephew CMA,  May 18, 2009 11:10 AM  Follow-up for Phone Call        Lab was ordered today at pt;s office visit.  Follow-up by: Lamar Sprinkles, CMA,  May 18, 2009 2:23 PM

## 2010-05-22 NOTE — Progress Notes (Signed)
Summary: LANTUS  Phone Note Call from Patient   Summary of Call: Pt takes lantus 15 u at bedtime. I do not see this in EMR, Patient is requesting refill. Please advise.  Initial call taken by: Lamar Sprinkles, CMA,  Sep 04, 2009 3:14 PM  Follow-up for Phone Call        reviewed records and last hospital d/c summary - she was on lantus 15 units at bedtime at time of discharge to SNF and evidently we missed putting this on her med list. OK to refill - lantus solostar pen - 15 units Subcutaneously at bedtime, 2 pens/month Follow-up by: Jacques Navy MD,  Sep 04, 2009 6:06 PM    New/Updated Medications: LANTUS SOLOSTAR 100 UNIT/ML SOLN (INSULIN GLARGINE) 15 units subcutaneously at bedtime LANTUS SOLOSTAR 100 UNIT/ML SOLN (INSULIN GLARGINE) 15 units at bedtime Prescriptions: LANTUS SOLOSTAR 100 UNIT/ML SOLN (INSULIN GLARGINE) 15 units at bedtime  #3 mth x 1   Entered by:   Lamar Sprinkles, CMA   Authorized by:   Jacques Navy MD   Signed by:   Lamar Sprinkles, CMA on 09/04/2009   Method used:   Electronically to        [facility] (retail)       [address]       [place], [place]  [address]       Ph: [phone]       Fax: [phone]   RxID:   [id number]

## 2010-05-22 NOTE — Medication Information (Signed)
Summary: Face to face/[facility]  Face to face/[facility]   Imported By: Sherian Rein 12/27/2009 16:05:45  _____________________________________________________________________  External Attachment:    Type:   Image     Comment:   External Document

## 2010-05-22 NOTE — Progress Notes (Signed)
Summary: Swelling  Phone Note Call from Patient Call back at Home Phone [phone]   Summary of Call: Patient left message on triage c/o knee swelling due to a fall and that she would like to see Dr. Posey Rea. FYI-patient had extreme trouble remembering who her MD is, her DOB, and her phone number. Initial call taken by: Lucious Groves,  October 18, 2009 2:15 PM  Follow-up for Phone Call        Spoke w/pt she c/o continued swelling. She does not believe that it is due to psychiatric meds b/c she has been on them for many years. Pt wanted an apt w/Dr Plotnikov to discuss. Advised I would let md know her concerns.  Follow-up by: Lamar Sprinkles, CMA,  October 18, 2009 4:43 PM  Additional Follow-up for Phone Call Additional follow up Details #1::        Patient seen 6/23 - exam did reveal some swelling left leg. Had venous doppler - negative. She had no pain or inflammation. If she feels she needs to be seen she can be put on my schedule unless she wishes to change MD's Additional Follow-up by: Jacques Navy MD,  October 18, 2009 4:48 PM    Additional Follow-up for Phone Call Additional follow up Details #2::    Tried to call pt, number is busy Follow-up by: Ami Bullins CMA,  October 19, 2009 9:14 AM  Additional Follow-up for Phone Call Additional follow up Details #3:: Details for Additional Follow-up Action Taken: Spoke w/[facility] RN, Pt c/o right leg pain 10 out of 10. Only has tylenol for pain which gives no relief. Describes pain as constant ache. Swelling per RN is minimal. Pt says she may have "over did" her activity yesterday. please adivse.............Marland KitchenLamar Sprinkles, CMA  October 20, 2009 11:08 AM   Options: 1) Urgent care eval at [facility] urgent care               2) saturday clinic evaluation  See other phone note..............Marland KitchenLamar Sprinkles, CMA  October 20, 2009 5:08 PM  Additional Follow-up by: Jacques Navy MD,  October 20, 2009 4:23 PM

## 2010-05-22 NOTE — Miscellaneous (Signed)
Summary: Order/[facility]  Order/[facility]   Imported By: Sherian Rein 11/17/2009 08:15:43  _____________________________________________________________________  External Attachment:    Type:   Image     Comment:   External Document

## 2010-05-22 NOTE — Progress Notes (Signed)
Summary: Results/Insulin ?  Phone Note Call from Patient Call back at Home Phone [phone]   Caller: Patient Call For: Dr Debby Bud Summary of Call: Pt has questions about how much Insulin to take, please call patient. Initial call taken by: Verdell Face,  December 12, 2009 2:17 PM  Follow-up for Phone Call        Patient left message on triage requesting lab results. Please advise.  Follow-up by: Lucious Groves CMA,  December 13, 2009 10:04 AM  Additional Follow-up for Phone Call Additional follow up Details #1::        A1C 8%. She has an appointment August 31st to discuss her labs and need for adjusting her medications.  Additional Follow-up by: Jacques Navy MD,  December 14, 2009 8:27 AM    Additional Follow-up for Phone Call Additional follow up Details #2::    Patient notified. Follow-up by: Lucious Groves CMA,  December 14, 2009 8:34 AM

## 2010-05-22 NOTE — Assessment & Plan Note (Signed)
Summary: smells when she urinates/#/cd   Vital Signs:  Patient profile:   70 year old female Height:      63 inches Weight:      173 pounds BMI:     30.76 O2 Sat:      97 % on Room air Temp:     96.7 degrees F oral Pulse rate:   101 / minute BP sitting:   168 / 82  (left arm) Cuff size:   regular  Vitals Entered By: Bill Salinas CMA (December 12, 2009 11:14 AM)  O2 Flow:  Room air CC: pt here with c/o urine smelling/ ab   Primary Care Provider:  Shelah Heatley  CC:  pt here with c/o urine smelling/ ab.  History of Present Illness: Patient presents for c/o strong odor to her urine and concern for infection. She has had no dysuria, frequency, flank pain, fever, abdominal pain.  She is concerned about the number of medications she takes and wants to review them: This was done and she needs all her current medications.  She requests referral for second opinion on treatment for her back pain.   Current Medications (verified): 1)  Altace 10 Mg Caps (Ramipril) .... Take 1 Tablet By Mouth Once A Day 2)  Aricept 10 Mg  Tabs (Donepezil Hcl) .... Take 1 Tablet By Mouth Once A Day 3)  Clonazepam 1 Mg Tabs (Clonazepam) .... Take 2 Tablet By Mouth Every Night 4)  Simvastatin 40 Mg  Tabs (Simvastatin) .... Take 1 Tablet By Mouth Once A Day 5)  Lasix 40 Mg Tabs (Furosemide) .... Take 1 Tablet By Mouth Once A Day 6)  Multiple Vitamin  Tabs (Multiple Vitamin) .... Take 1 Tablet By Mouth Once A Day 7)  Norvasc 10 Mg Tabs (Amlodipine Besylate) .... Take 1 Tablet By Mouth Once A Day 8)  Omeprazole 20 Mg  Tbec (Omeprazole) .... 2 By Mouth Qam 9)  Haldol 5 Mg/ml  Soln (Haloperidol Lactate) .... Twice A Month 10)  Haloperidol 10 Mg  Tabs (Haloperidol) .... Take 1 By Mouth Qam and 2 By Mouth Qhs 11)  Bayer Aspirin 325 Mg  Tabs (Aspirin) .... Take 1 Tablet By Mouth Once A Day 12)  Cogentin 0.5mg  .... Take 1 Tab By Mouth At Bedtime 13)  Glyburide 5 Mg  Tabs (Glyburide) .... 2 By Mouth Qd 14)  Amitriptyline  Hcl 25 Mg  Tabs (Amitriptyline Hcl) .Marland Kitchen.. 1po Qhs 15)  Novolog Mix 70/30 Flexpen 70-30 % Susp (Insulin Aspart Prot & Aspart) .... 25 Units Two Times A Day 16)  Clozaril 100 Mg Tabs (Clozapine) .... Take 1 Tab By Mouth At Bedtime 17)  Clozaril 25 Mg Tabs (Clozapine) .... Take 3 Tab By Mouth At Bedtime 18)  Metoprolol Succinate 25 Mg Xr24h-Tab (Metoprolol Succinate) .... 1/2 Tab Once Daily For Bp 19)  Vitamin D3 1000 Unit  Tabs (Cholecalciferol) .Marland Kitchen.. 1 By Mouth Daily 20)  Potassium Chloride Cr 8 Meq Cr-Tabs (Potassium Chloride) .Marland Kitchen.. 1 Daily 21)  Acetaminophen 500 Mg Tabs (Acetaminophen) .Marland Kitchen.. 1 or 2 Tablets Three Times A Day As Needed For Back Pain. 22)  Actos 30 Mg Tabs (Pioglitazone Hcl) .Marland Kitchen.. 1 By Mouth Once Daily 23)  Lantus Solostar 100 Unit/ml Soln (Insulin Glargine) .Marland Kitchen.. 15 Units Subcutaneously At Bedtime 24)  Lantus Solostar 100 Unit/ml Soln (Insulin Glargine) .Marland Kitchen.. 15 Units At Bedtime  Allergies (verified): 1)  ! * Vitamin C 2)  Amaryl (Glimepiride) 3)  Codeine Phosphate (Codeine Phosphate) 4)  Glyburide (Glyburide)  Past  History:  Past Medical History: Last updated: 02/03/2009 Anemia-iron deficiency Anxiety Coronary artery disease Diabetes mellitus, type II GERD Hypertension Peripheral neuropathy schizoprenia Hyperlipidemia Allergic rhinitis Peripheral vascular disease - left subclavian 70% stenosis Dementia IBS hx of syncope - EP study 1996 with atrial tachycardia Esoph motility disorder 2010 Renal insufficiency  Past Surgical History: Last updated: 06/02/2007 Hysterectomy Percutaneous transluminal coronary angioplasty 1995 cath 3/98 s/p right knee arthroplasty 7/04 s/p right breast bx - neg  Family History: Last updated: 10/26/2007 Neg-breast or colon cancer; CAD; schizophrenia Pos - DM, HTN  Social History: Last updated: 10/26/2007 11th grade education. Never married 5 chldren: 4 daughters, 1 son; 7 grandchildren. lives alone. Has a supportive  family.  Review of Systems  The patient denies anorexia, fever, weight loss, weight gain, chest pain, peripheral edema, abdominal pain, hematuria, muscle weakness, abnormal bleeding, and enlarged lymph nodes.    Physical Exam  General:  WNWD AA female in no distress Head:  normocephalic and atraumatic.   Eyes:  C&S clear, PERRLA Lungs:  normal respiratory effort and normal breath sounds.   Heart:  normal rate and regular rhythm.   Abdomen:  soft, non-tender, and normal bowel sounds.  No supra-pubic tenderness Pulses:  2+ radial Neurologic:  alert & oriented X3 and gait normal.   Skin:  turgor normal and color normal.   Psych:  normally interactive and good eye contact.  Her usual affect   Impression & Recommendations:  Problem # 1:  UTI (ICD-599.0) Dip U/A negative, exam negative and symptoms not c/w infection  Patient reassured she doesn't have an infection.  Problem # 2:  BACK PAIN (ICD-724.5) Chronic back pain.  Plan - referral for second opinion for management  Her updated medication list for this problem includes:    Bayer Aspirin 325 Mg Tabs (Aspirin) .Marland Kitchen... Take 1 tablet by mouth once a day    Acetaminophen 500 Mg Tabs (Acetaminophen) .Marland Kitchen... 1 or 2 tablets three times a day as needed for back pain.  Orders: Orthopedic Surgeon Referral (Ortho Surgeon)  Problem # 3:  RENAL INSUFFICIENCY (ICD-588.9) Patinet is due for follow-up lab regarding her renal insufficiency wiht recommendations to follow  Orders: TLB-BMP (Basic Metabolic Panel-BMET) (80048-METABOL)   Addendum - creatinine is stable.  Problem # 4:  HYPERLIPIDEMIA (ICD-272.4) Due for follow-up lab.  Her updated medication list for this problem includes:    Simvastatin 40 Mg Tabs (Simvastatin) .Marland Kitchen... Take 1 tablet by mouth once a day  Orders: TLB-Lipid Panel (80061-LIPID)  Addendum - excellent control with LDL 59  Will continue present medications  Problem # 5:  DIABETES MELLITUS, TYPE II  (ICD-250.00) Patient on complex regimen including glyburide, actos, novolog and lantus. She is less than reliable.  Plan - d/c glyburide for concern of hypoglycemia if she doesn't eat or mismanages her insulin           Continue other meds           A1C  The following medications were removed from the medication list:    Glyburide 5 Mg Tabs (Glyburide) .Marland Kitchen... 2 by mouth qd    Lantus Solostar 100 Unit/ml Soln (Insulin glargine) .Marland KitchenMarland KitchenMarland KitchenMarland Kitchen 15 units subcutaneously at bedtime Her updated medication list for this problem includes:    Altace 10 Mg Caps (Ramipril) .Marland Kitchen... Take 1 tablet by mouth once a day    Bayer Aspirin 325 Mg Tabs (Aspirin) .Marland Kitchen... Take 1 tablet by mouth once a day    Novolog Mix 70/30 Flexpen 70-30 % Susp (Insulin  aspart prot & aspart) .Marland Kitchen... 25 units two times a day    Actos 30 Mg Tabs (Pioglitazone hcl) .Marland Kitchen... 1 by mouth once daily    Lantus Solostar 100 Unit/ml Soln (Insulin glargine) .Marland KitchenMarland KitchenMarland KitchenMarland Kitchen 15 units at bedtime  Orders: TLB-A1C / Hgb A1C (Glycohemoglobin) (83036-A1C)  Addendum - A1C 8% - will need to discuss glucose mnagment at next ov.  Complete Medication List: 1)  Altace 10 Mg Caps (Ramipril) .... Take 1 tablet by mouth once a day 2)  Aricept 10 Mg Tabs (Donepezil hcl) .... Take 1 tablet by mouth once a day 3)  Clonazepam 1 Mg Tabs (Clonazepam) .... Take 2 tablet by mouth every night 4)  Simvastatin 40 Mg Tabs (Simvastatin) .... Take 1 tablet by mouth once a day 5)  Lasix 40 Mg Tabs (Furosemide) .... Take 1 tablet by mouth once a day 6)  Multiple Vitamin Tabs (Multiple vitamin) .... Take 1 tablet by mouth once a day 7)  Norvasc 10 Mg Tabs (Amlodipine besylate) .... Take 1 tablet by mouth once a day 8)  Omeprazole 20 Mg Tbec (Omeprazole) .... 2 by mouth qam 9)  Haldol 5 Mg/ml Soln (Haloperidol lactate) .... Twice a month 10)  Haloperidol 10 Mg Tabs (Haloperidol) .... Take 1 by mouth qam and 2 by mouth qhs 11)  Bayer Aspirin 325 Mg Tabs (Aspirin) .... Take 1 tablet by mouth once  a day 12)  Cogentin 0.5mg   .... Take 1 tab by mouth at bedtime 13)  Amitriptyline Hcl 25 Mg Tabs (Amitriptyline hcl) .Marland Kitchen.. 1po qhs 14)  Novolog Mix 70/30 Flexpen 70-30 % Susp (Insulin aspart prot & aspart) .... 25 units two times a day 15)  Clozaril 100 Mg Tabs (Clozapine) .... Take 1 tab by mouth at bedtime 16)  Clozaril 25 Mg Tabs (Clozapine) .... Take 3 tab by mouth at bedtime 17)  Metoprolol Succinate 25 Mg Xr24h-tab (Metoprolol succinate) .... 1/2 tab once daily for bp 18)  Vitamin D3 1000 Unit Tabs (Cholecalciferol) .Marland Kitchen.. 1 by mouth daily 19)  Potassium Chloride Cr 8 Meq Cr-tabs (Potassium chloride) .Marland Kitchen.. 1 daily 20)  Acetaminophen 500 Mg Tabs (Acetaminophen) .Marland Kitchen.. 1 or 2 tablets three times a day as needed for back pain. 21)  Actos 30 Mg Tabs (Pioglitazone hcl) .Marland Kitchen.. 1 by mouth once daily 22)  Lantus Solostar 100 Unit/ml Soln (Insulin glargine) .Marland Kitchen.. 15 units at bedtime   Patient: [name] Note: All result statuses are Final unless otherwise noted.  Tests: (1) BMP (METABOL)   Sodium                    145 mEq/L                   135-145   Potassium                 4.0 mEq/L                   3.5-5.1   Chloride                  102 mEq/L                   96-112   Carbon Dioxide            30 mEq/L                    19-32   Glucose  83 mg/dL                    16-10   BUN                       16 mg/dL                    9-60   Creatinine                0.9 mg/dL                   4.5-4.0   Calcium                   10.3 mg/dL                  9.8-11.9   GFR                       83.02 mL/min                >60  Tests: (2) Hemoglobin A1C (A1C)   Hemoglobin A1C       [H]  8.0 %                       4.6-6.5     Glycemic Control Guidelines for People with Diabetes:     Non Diabetic:  <6%     Goal of Therapy: <7%     Additional Action Suggested:  >8%   Tests: (3) Lipid Panel (LIPID)   Cholesterol          [H]  205 mg/dL                   1-478     ATP III  Classification            Desirable:  < 200 mg/dL                    Borderline High:  200 - 239 mg/dL               High:  > = 240 mg/dL   Triglycerides             49.0 mg/dL                  2.9-562.1     Normal:  <150 mg/dL     Borderline High:  308 - 199 mg/dL   HDL                       657.84 mg/dL                >69.62   VLDL Cholesterol          9.8 mg/dL                   9.5-28.4  CHO/HDL Ratio:  CHD Risk                             2                    Men          Women     1/2 Average Risk     3.4  3.3     Average Risk          5.0          4.4     2X Average Risk          9.6          7.1     3X Average Risk          15.0          11.0                           Tests: (4) Cholesterol LDL - Direct (DIRLDL)  Cholesterol LDL - Direct                             59.4 mg/dL  Patient Instructions: 1)  No evidence of a urinary track infection 2)  Diabetes - will continue actos, novolog and lantus. STOP glyburide. 3)  Labs today for cholesterol, diabetes and kidney function.   Laboratory Results   Urine Tests   Date/Time Reported: Ami Bullins CMA  December 12, 2009 11:48 AM   Routine Urinalysis   Color: lt. yellow Appearance: Clear Glucose: negative   (Normal Range: Negative) Bilirubin: negative   (Normal Range: Negative) Ketone: negative   (Normal Range: Negative) Spec. Gravity: <1.005   (Normal Range: 1.003-1.035) Blood: negative   (Normal Range: Negative) pH: 5.0   (Normal Range: 5.0-8.0) Protein: negative   (Normal Range: Negative) Urobilinogen: 0.2   (Normal Range: 0-1) Nitrite: negative   (Normal Range: Negative) Leukocyte Esterace: negative   (Normal Range: Negative)

## 2010-05-22 NOTE — Letter (Signed)
Summary: [facility]   Imported By: Lennie Odor 01/08/2010 16:06:03  _____________________________________________________________________  External Attachment:    Type:   Image     Comment:   External Document

## 2010-05-22 NOTE — Progress Notes (Signed)
Summary: Weakness  Phone Note Call from Patient   Summary of Call: Pt c/o increase in weakness in her legs causing multiple falls. She is requesting to see Dr Posey Rea for a second opinion. Per pt, legs are swollen twice their size. Per [facility] RN who saw pt today, swelling is minimal.  Initial call taken by: Lamar Sprinkles, CMA,  October 20, 2009 4:10 PM  Follow-up for Phone Call        see earlier phone note: urgenct care or saturday clinic. NOte that the home attendent sais swelling was minimal Follow-up by: Jacques Navy MD,  October 20, 2009 4:59 PM  Additional Follow-up for Phone Call Additional follow up Details #1::        Spoke w/pt, advise eval soon. She has transportation issues and will get to UC if has increased problems. Pt will also f/u next week if needed.  Additional Follow-up by: Lamar Sprinkles, CMA,  October 20, 2009 5:08 PM

## 2010-05-22 NOTE — Letter (Signed)
Summary[facility]   Imported By: Sherian Rein 11/03/2009 12:23:40  _____________________________________________________________________  External Attachment:    Type:   Image     Comment:   External Document

## 2010-05-22 NOTE — Progress Notes (Signed)
Summary: Namenda rx   Phone Note Call from Patient   Caller: Daughter--Valerie [phone] Summary of Call: Patient daughter left message on triage that patient needs prescription for Namenda. She was notified per Dr. Electa Sniff this prescription should come from her mother's PCP. Please advise. Initial call taken by: Lucious Groves,  May 05, 2009 1:25 PM  Follow-up for Phone Call        I need some documentation, i.e. office note or evaluation from Dr. Electa Sniff or other re: indication for namenda in this patient already on aricept and many other psychotropic drugs. Follow-up by: Jacques Navy MD,  May 05, 2009 5:53 PM  Additional Follow-up for Phone Call Additional follow up Details #1::        Left vm for pt's daughter with above info and to call office back to confirm she recieved the message.  Additional Follow-up by: Lamar Sprinkles, CMA,  May 09, 2009 6:40 PM    Additional Follow-up for Phone Call Additional follow up Details #2::    Multiple calls to daughter, no return call, closing note Follow-up by: Lamar Sprinkles, CMA,  May 12, 2009 10:08 AM

## 2010-05-22 NOTE — Miscellaneous (Signed)
Summary: Order/[facility]  Order/[facility]   Imported By: Lester [place] 11/01/2009 09:13:05  _____________________________________________________________________  External Attachment:    Type:   Image     Comment:   External Document

## 2010-05-22 NOTE — Assessment & Plan Note (Signed)
Summary: TIRED--WEAK--STC   Vital Signs:  Patient profile:   70 year old female Height:      63 inches Weight:      167 pounds BMI:     29.69 O2 Sat:      97 % on Room air Temp:     97.3 degrees F oral Pulse rate:   96 / minute BP sitting:   138 / 78  (left arm) Cuff size:   regular  Vitals Entered By: Bill Salinas CMA (May 18, 2009 1:18 PM)  O2 Flow:  Room air CC: pt here with complaint of weakness and fatigue x 4 months. Pt also c/o lower back pain that she states has been going on for little over a year. pt is due for tetanus and states she has never had a pneumonia shot. She doesn't think she has ever had a bone density scan or colon I will order her chart to verify this/ ab   Primary Care Provider:  Raechal Raben  CC:  pt here with complaint of weakness and fatigue x 4 months. Pt also c/o lower back pain that she states has been going on for little over a year. pt is due for tetanus and states she has never had a pneumonia shot. She doesn't think she has ever had a bone density scan or colon I will order her chart to verify this/ ab.  History of Present Illness: Patient presents due to increased weakness and a concern for weight gain. Reviewed her medications, now provided by [facility]- who comes to the house for her care. She has been on clozapine- last drug added - which does cause weight gain. She does complain of weakness, fatigue and severe back pain-chronic problem. She reports that she does need to have lab work due to being on clozaril.   Current Medications (verified): 1)  Altace 10 Mg Caps (Ramipril) .... Take 1 Tablet By Mouth Once A Day 2)  Aricept 10 Mg  Tabs (Donepezil Hcl) .... Take 1 Tablet By Mouth Once A Day 3)  Clonazepam 1 Mg Tabs (Clonazepam) .... Take 2 Tablet By Mouth Every Night 4)  Simvastatin 40 Mg  Tabs (Simvastatin) .... Take 1 Tablet By Mouth Once A Day 5)  Lasix 40 Mg Tabs (Furosemide) .... Take 1 Tablet By Mouth Once A Day 6)  Multiple Vitamin  Tabs  (Multiple Vitamin) .... Take 1 Tablet By Mouth Once A Day 7)  Norvasc 10 Mg Tabs (Amlodipine Besylate) .... Take 1 Tablet By Mouth Once A Day 8)  Omeprazole 20 Mg  Tbec (Omeprazole) .... 2 By Mouth Qam 9)  Haldol 5 Mg/ml  Soln (Haloperidol Lactate) .... Twice A Month 10)  Haloperidol 10 Mg  Tabs (Haloperidol) .... Take 1 By Mouth Qam and 2 By Mouth Qhs 11)  Bayer Aspirin 325 Mg  Tabs (Aspirin) .... Take 1 Tablet By Mouth Once A Day 12)  Cogentin 0.5mg  .... Take 1 Tab By Mouth At Bedtime 13)  Glyburide 5 Mg  Tabs (Glyburide) .... 2 By Mouth Qd 14)  Amitriptyline Hcl 25 Mg  Tabs (Amitriptyline Hcl) .Marland Kitchen.. 1po Qhs 15)  Novolog Mix 70/30 Flexpen 70-30 % Susp (Insulin Aspart Prot & Aspart) .... 25 Units Two Times A Day 16)  Clozaril 100 Mg Tabs (Clozapine) .... Take 1 Tab By Mouth At Bedtime 17)  Clozaril 25 Mg Tabs (Clozapine) .... Take 3 Tab By Mouth At Bedtime 18)  Metoprolol Succinate 25 Mg Xr24h-Tab (Metoprolol Succinate) .... 1/2 Tab Once Daily For  Bp 19)  Vitamin D3 1000 Unit  Tabs (Cholecalciferol) .Marland Kitchen.. 1 By Mouth Daily 20)  Potassium Chloride Cr 8 Meq Cr-Tabs (Potassium Chloride) .Marland Kitchen.. 1 Daily 21)  Acetaminophen 500 Mg Tabs (Acetaminophen) .Marland Kitchen.. 1 or 2 Tablets Three Times A Day As Needed For Back Pain.  Allergies (verified): 1)  ! * Vitamin C 2)  Amaryl (Glimepiride) 3)  Codeine Phosphate (Codeine Phosphate) 4)  Glyburide (Glyburide)  Past History:  Past Medical History: Last updated: 02/03/2009 Anemia-iron deficiency Anxiety Coronary artery disease Diabetes mellitus, type II GERD Hypertension Peripheral neuropathy schizoprenia Hyperlipidemia Allergic rhinitis Peripheral vascular disease - left subclavian 70% stenosis Dementia IBS hx of syncope - EP study 1996 with atrial tachycardia Esoph motility disorder 2010 Renal insufficiency  Past Surgical History: Last updated: 06/02/2007 Hysterectomy Percutaneous transluminal coronary angioplasty 1995 cath 3/98 s/p right knee  arthroplasty 7/04 s/p right breast bx - neg  Family History: Last updated: 10/26/2007 Neg-breast or colon cancer; CAD; schizophrenia Pos - DM, HTN  Social History: Last updated: 10/26/2007 11th grade education. Never married 5 chldren: 4 daughters, 1 son; 7 grandchildren. lives alone. Has a supportive family.  Risk Factors: Caffeine Use: 0 (10/26/2007) Exercise: no (10/26/2007)  Risk Factors: Smoking Status: quit (02/03/2009) Passive Smoke Exposure: no (10/26/2007)  Review of Systems  The patient denies anorexia, fever, weight loss, weight gain, decreased hearing, hoarseness, chest pain, dyspnea on exertion, peripheral edema, prolonged cough, hemoptysis, abdominal pain, hematochezia, genital sores, muscle weakness, transient blindness, depression, abnormal bleeding, and angioedema.    Physical Exam  General:  WNWD AA female in no acute distress Head:  Normocephalic and atraumatic without obvious abnormalities. No apparent alopecia or balding. Eyes:  pupils equal, pupils round, corneas and lenses clear, and no injection.   Mouth:  hyperplasia of the gums, poor dentition Neck:  full ROM, no thyromegaly, and no thyroid nodules or tenderness.   Chest Wall:  no tenderness.   Lungs:  normal respiratory effort, normal breath sounds, no crackles, and no wheezes.   Heart:  normal rate and regular rhythm.   Abdomen:  soft, non-tender, no guarding, no rigidity, and no hepatomegaly.   Msk:  normal ROM, no joint tenderness, no redness over joints, and no joint instability.   Pulses:  2+ radial Neurologic:  alert & oriented X3, cranial nerves II-XII intact, strength normal in all extremities, gait normal, and DTRs symmetrical and normal.   Skin:  turgor normal, color normal, and no purpura.   Cervical Nodes:  no anterior cervical adenopathy.   Psych:  Oriented X3 and memory intact for recent and remote.  Flat affect   Impression & Recommendations:  Problem # 1:  OTHER MALAISE AND  FATIGUE (ICD-780.79) No new problemsw except for malaise and weight gain the later may be due to medication. She has a non-focal exam.  Plan  - will check B12, CBC, meatabolic panel, TSH to rule out metablic cause for her malaise and fatgiue.   Orders: Vit B12 1000 mcg (J3420) Admin of Therapeutic Inj  intramuscular or subcutaneous (36644)  Labs look ok: mild anemia which should not cause symptoms, all else looks fine.   Problem # 2:  RENAL INSUFFICIENCY (ICD-588.9) stable and at her baseline. NO further eval at this time.  Problem # 3:  HYPERTENSION (ICD-401.9)  Her updated medication list for this problem includes:    Altace 10 Mg Caps (Ramipril) .Marland Kitchen... Take 1 tablet by mouth once a day    Lasix 40 Mg Tabs (Furosemide) .Marland Kitchen... Take 1 tablet  by mouth once a day    Norvasc 10 Mg Tabs (Amlodipine besylate) .Marland Kitchen... Take 1 tablet by mouth once a day    Metoprolol Succinate 25 Mg Xr24h-tab (Metoprolol succinate) .Marland Kitchen... 1/2 tab once daily for bp  BP today: 138/78 Prior BP: 166/78 (03/20/2009)  BP is OK. Plan is to continue present medications.  Labs Reviewed: K+: 4.6 (03/20/2009) Creat: : 1.1 (03/20/2009)   Chol: 192 (12/30/2008)   HDL: 111.00 (12/30/2008)   LDL: 69 (12/30/2008)   TG: 61.0 (12/30/2008)  Problem # 4:  DIABETES MELLITUS, TYPE II (ICD-250.00) A1C returns at 7.1% - well contolled.  Plan - continue present medications, including insulin   Her updated medication list for this problem includes:    Altace 10 Mg Caps (Ramipril) .Marland Kitchen... Take 1 tablet by mouth once a day    Bayer Aspirin 325 Mg Tabs (Aspirin) .Marland Kitchen... Take 1 tablet by mouth once a day    Glyburide 5 Mg Tabs (Glyburide) .Marland Kitchen... 2 by mouth qd    Novolog Mix 70/30 Flexpen 70-30 % Susp (Insulin aspart prot & aspart) .Marland Kitchen... 25 units two times a day  Orders: TLB-A1C / Hgb A1C (Glycohemoglobin) (83036-A1C)  Complete Medication List: 1)  Altace 10 Mg Caps (Ramipril) .... Take 1 tablet by mouth once a day 2)  Aricept 10 Mg  Tabs (Donepezil hcl) .... Take 1 tablet by mouth once a day 3)  Clonazepam 1 Mg Tabs (Clonazepam) .... Take 2 tablet by mouth every night 4)  Simvastatin 40 Mg Tabs (Simvastatin) .... Take 1 tablet by mouth once a day 5)  Lasix 40 Mg Tabs (Furosemide) .... Take 1 tablet by mouth once a day 6)  Multiple Vitamin Tabs (Multiple vitamin) .... Take 1 tablet by mouth once a day 7)  Norvasc 10 Mg Tabs (Amlodipine besylate) .... Take 1 tablet by mouth once a day 8)  Omeprazole 20 Mg Tbec (Omeprazole) .... 2 by mouth qam 9)  Haldol 5 Mg/ml Soln (Haloperidol lactate) .... Twice a month 10)  Haloperidol 10 Mg Tabs (Haloperidol) .... Take 1 by mouth qam and 2 by mouth qhs 11)  Bayer Aspirin 325 Mg Tabs (Aspirin) .... Take 1 tablet by mouth once a day 12)  Cogentin 0.5mg   .... Take 1 tab by mouth at bedtime 13)  Glyburide 5 Mg Tabs (Glyburide) .... 2 by mouth qd 14)  Amitriptyline Hcl 25 Mg Tabs (Amitriptyline hcl) .Marland Kitchen.. 1po qhs 15)  Novolog Mix 70/30 Flexpen 70-30 % Susp (Insulin aspart prot & aspart) .... 25 units two times a day 16)  Clozaril 100 Mg Tabs (Clozapine) .... Take 1 tab by mouth at bedtime 17)  Clozaril 25 Mg Tabs (Clozapine) .... Take 3 tab by mouth at bedtime 18)  Metoprolol Succinate 25 Mg Xr24h-tab (Metoprolol succinate) .... 1/2 tab once daily for bp 19)  Vitamin D3 1000 Unit Tabs (Cholecalciferol) .Marland Kitchen.. 1 by mouth daily 20)  Potassium Chloride Cr 8 Meq Cr-tabs (Potassium chloride) .Marland Kitchen.. 1 daily 21)  Acetaminophen 500 Mg Tabs (Acetaminophen) .Marland Kitchen.. 1 or 2 tablets three times a day as needed for back pain.  Other Orders: TLB-BMP (Basic Metabolic Panel-BMET) (80048-METABOL) TLB-Lipid Panel (80061-LIPID) TLB-Hepatic/Liver Function Pnl (80076-HEPATIC) TLB-CBC Platelet - w/Differential (85025-CBCD) TLB-B12 + Folate Pnl (16109_60454-U98/JXB)   Not Administered:    Influenza Vaccine not given due to: declined    Medication Administration  Injection # 1:    Medication: Vit B12 1000  mcg    Diagnosis: FATIGUE (ICD-780.79)    Route: IM  Site: R deltoid    Exp Date: 02/21/2011    Lot #: [id number]    Mfr: [employer]    Patient tolerated injection without complications    Given by: Brenton Grills (May 18, 2009 1:55 PM)  Orders Added: 1)  TLB-BMP (Basic Metabolic Panel-BMET) [80048-METABOL] 2)  TLB-Lipid Panel [80061-LIPID] 3)  TLB-Hepatic/Liver Function Pnl [80076-HEPATIC] 4)  TLB-CBC Platelet - w/Differential [85025-CBCD] 5)  TLB-A1C / Hgb A1C (Glycohemoglobin) [83036-A1C] 6)  TLB-B12 + Folate Pnl [82746_82607-B12/FOL] 7)  Vit B12 1000 mcg [J3420] 8)  Admin of Therapeutic Inj  intramuscular or subcutaneous [96372] 9)  Est. Patient Level IV [10932]

## 2010-05-22 NOTE — Progress Notes (Signed)
Summary: u/a order  Phone Note Call from Patient   Summary of Call: Patient is requesting u/a, c/o uti, frequency/burning. Ok for u/a order to go to [facility]?  Initial call taken by: Lamar Sprinkles, CMA,  October 16, 2009 9:46 AM  Follow-up for Phone Call        OK for U/A 595.0 Follow-up by: Jacques Navy MD,  October 16, 2009 11:34 AM  Additional Follow-up for Phone Call Additional follow up Details #1::        [facility] and [facility] informed Additional Follow-up by: Lamar Sprinkles, CMA,  October 16, 2009 1:25 PM

## 2010-05-22 NOTE — Letter (Signed)
Summary: [facility]   [facility]   Imported By: Roderic Ovens 04/25/2009 13:46:51  _____________________________________________________________________  External Attachment:    Type:   Image     Comment:   External Document

## 2010-05-22 NOTE — Progress Notes (Signed)
Summary: MED CHANGE  Phone Note Call from Patient   Summary of Call: Pt left vm. She "found out about" a pain pill called lyrica. She would like this for her pain in the back and abdomen.  Initial call taken by: Lamar Sprinkles, CMA,  July 10, 2009 9:48 AM  Follow-up for Phone Call        will need OV to change meds  Follow-up by: Jacques Navy MD,  July 10, 2009 12:57 PM  Additional Follow-up for Phone Call Additional follow up Details #1::        Pt informed, she will check with transportation and then call office back Additional Follow-up by: Lamar Sprinkles, CMA,  July 10, 2009 2:01 PM

## 2010-05-22 NOTE — Progress Notes (Signed)
Summary: Knee pain from insulin  Phone Note Call from Patient   Summary of Call: Pt c/o pain in the back of her knee. No injuries. She thinks this is related to the 25u of insulin she just took. Advised pt this should not be a side effect. She last took a dose of tylenol early this am. Advised her to take her normal dose of tylenol and rest. She will monitor the pain and call office back if it continues. Also advised eval if had severe symptoms, or redness or swelling.  Initial call taken by: Lamar Sprinkles, CMA,  December 20, 2009 4:28 PM  Follow-up for Phone Call        agree with note: not an insulin reaction.  Follow-up by: Jacques Navy MD,  December 21, 2009 8:03 AM

## 2010-05-22 NOTE — Miscellaneous (Signed)
Summary: [facility]  [facility]   Imported By: Lester [place] 10/25/2009 08:12:40  _____________________________________________________________________  External Attachment:    Type:   Image     Comment:   External Document

## 2010-05-22 NOTE — Letter (Signed)
[facility] [address] [place], [place]  [address] Phone: [phone]      May 22, 2009   [name] [address] [place], [place] [address]  RE:  LAB RESULTS  Dear  [name],  The following is an interpretation of your most recent lab tests.  Please take note of any instructions provided or changes to medications that have resulted from your lab work.  ELECTROLYTES:  Good - no changes needed  KIDNEY FUNCTION TESTS:  Good - no changes needed  LIVER FUNCTION TESTS:  Good - no changes needed  LIPID PANEL:  Good - no changes needed Triglyceride: 130.0   Cholesterol: 205   LDL: 69   HDL: 134.70   Chol/HDL%:  2  THYROID STUDIES:  Thyroid studies normal TSH: 0.58     DIABETIC STUDIES:  Good - no changes needed Blood Glucose: 93   HgbA1C: 7.1   Microalbumin/Creatinine Ratio: 105.4     CBC:  Good - no changes needed   All lab results look fine. No explanation for fatigue or weight gain.   Call or e-mail me if you have questions ([email or web address]).   Sincerely Yours,    Jacques Navy MD  Patient: [name] Note: All result statuses are Final unless otherwise noted.  Tests: (1) BMP (METABOL)   Sodium                    143 mEq/L                   135-145   Potassium                 3.9 mEq/L                   3.5-5.1   Chloride                  105 mEq/L                   96-112   Carbon Dioxide            29 mEq/L                    19-32   Glucose                   93 mg/dL                    66-44   BUN                       13 mg/dL                    0-34   Creatinine                0.9 mg/dL                   7.4-2.5   Calcium              [H]  10.6 mg/dL                  9.5-63.8   GFR                       79.97 mL/min                >60  Tests: (2) Lipid Panel (LIPID)   Cholesterol          [H]  205 mg/dL                   8-295     ATP III Classification            Desirable:  < 200 mg/dL                     Borderline High:  200 - 239 mg/dL               High:  > = 240 mg/dL   Triglycerides             130.0 mg/dL                 6.2-130.8     Normal:  <150 mg/dL     Borderline High:  657 - 199 mg/dL   HDL                       846.96 mg/dL                >29.52   VLDL Cholesterol          26.0 mg/dL                  8.4-13.2  CHO/HDL Ratio:  CHD Risk                             2                    Men          Women     1/2 Average Risk     3.4          3.3     Average Risk          5.0          4.4     2X Average Risk          9.6          7.1     3X Average Risk          15.0          11.0                           Tests: (3) Hepatic/Liver Function Panel (HEPATIC)   Total Bilirubin           0.5 mg/dL                   4.4-0.1   Direct Bilirubin          0.1 mg/dL                   0.2-7.2   Alkaline Phosphatase      80 U/L                      39-117   AST                       16 U/L                      0-37   ALT  14 U/L                      0-35   Total Protein        [H]  8.4 g/dL                    1.1-9.1   Albumin                   4.4 g/dL                    4.7-8.2  Tests: (4) CBC Platelet w/Diff (CBCD)   White Cell Count          10.1 K/uL                   4.5-10.5   Red Cell Count       [L]  3.65 Mil/uL                 3.87-5.11   Hemoglobin           [L]  11.6 g/dL                   95.6-21.3   Hematocrit           [L]  35.0 %                      36.0-46.0   MCV                       95.8 fl                     78.0-100.0   MCHC                      33.0 g/dL                   08.6-57.8   RDW                  [H]  15.1 %                      11.5-14.6   Platelet Count            239.0 K/uL                  150.0-400.0   Neutrophil %              58.9 %                      43.0-77.0   Lymphocyte %              33.3 %                      12.0-46.0   Monocyte %                3.9 %                       3.0-12.0   Eosinophils%               0.5 %                       0.0-5.0   Basophils %          [  H]  3.4 %                       0.0-3.0   Neutrophill Absolute      5.9 K/uL                    1.4-7.7   Lymphocyte Absolute       3.4 K/uL                    0.7-4.0   Monocyte Absolute         0.4 K/uL                    0.1-1.0  Eosinophils, Absolute                             0.1 K/uL                    0.0-0.7   Basophils Absolute   [H]  0.3 K/uL                    0.0-0.1  Tests: (5) Hemoglobin A1C (A1C)   Hemoglobin A1C       [H]  7.1 %                       4.6-6.5     Glycemic Control Guidelines for People with Diabetes:     Non Diabetic:  <6%     Goal of Therapy: <7%     Additional Action Suggested:  >8%   Tests: (6) B12 + Folate Panel (B12/FOL)   Vitamin B12          [H]  >1500 pg/mL                 211-911   Folate                    16.8 ng/mL     Deficient  0.4 - 3.4 ng/mL     Indeterminate  3.4 - 5.4 ng/mL     Normal  >5.4 ng/mL  Tests: (7) Cholesterol LDL - Direct (DIRLDL)  Cholesterol LDL - Direct                             50.9 mg/dL

## 2010-05-22 NOTE — Consult Note (Signed)
Summary: [facility]   Imported By: Sherian Rein 07/17/2009 13:29:39  _____________________________________________________________________  External Attachment:    Type:   Image     Comment:   External Document

## 2010-05-22 NOTE — Progress Notes (Signed)
  Phone Note Refill Request Message from:  Fax from Pharmacy on May 05, 2009 1:03 PM  recieved fax from [facility] [phone]) requesting prescription for Tylenol Arth. 650mg  cap. can this be filled for pt? Please Advise  Initial call taken by: Ami Bullins CMA,  May 05, 2009 1:04 PM  Follow-up for Phone Call        med list shows apap 500mg  2 tabs three times a day. She should not exceed a daily dose of 3000mg /24hr Follow-up by: Jacques Navy MD,  May 05, 2009 1:19 PM    Prescriptions: ACETAMINOPHEN 500 MG TABS (ACETAMINOPHEN) 1 or 2 tablets three times a day as needed for back pain.  #180 x 1   Entered by:   Bill Salinas CMA   Authorized by:   Jacques Navy MD   Signed by:   Bill Salinas CMA on 05/05/2009   Method used:   Electronically to        [facility] (retail)       [address]       [place], [place]  [address]       Ph: [phone]       Fax: [phone]   RxID:   [id number]

## 2010-05-22 NOTE — Consult Note (Signed)
Summary: [facility]  [facility]   Imported By: Lanelle Bal 04/25/2009 13:04:35  _____________________________________________________________________  External Attachment:    Type:   Image     Comment:   External Document

## 2010-05-22 NOTE — Progress Notes (Signed)
----   Converted from flag ---- ---- 12/13/2009 8:45 AM, Jacques Navy MD wrote: needs follow-up ov to review lab results and diabetes management ------------------------------ Gave pt appt w/Dr Norins:  12/20/09 @ 1050A--phone

## 2010-05-22 NOTE — Letter (Signed)
Summary: Certificate of Medical Necessity/ [facility]  Certificate of Medical Necessity/ [facility]   Imported By: Lennie Odor 08/01/2009 15:21:01  _____________________________________________________________________  External Attachment:    Type:   Image     Comment:   External Document

## 2010-05-22 NOTE — Letter (Signed)
[facility]   Imported By: Roderic Ovens 05/24/2009 11:30:47  _____________________________________________________________________  External Attachment:    Type:   Image     Comment:   External Document

## 2010-05-22 NOTE — Progress Notes (Signed)
Summary: referral request  Phone Note Call from Patient Call back at Home Phone [phone]   Summary of Call: Patient left message on triage that she has fallen 5x and has a hole in her knee. Patient states that she needs referral to Dr. Brynda Greathouse, she needs this referral before 2pm. Please advise. Initial call taken by: Lucious Groves,  October 27, 2009 1:26 PM  Follow-up for Phone Call        Please advise, patient has left several messages on triage. Thanks. Follow-up by: Lucious Groves,  October 27, 2009 1:44 PM  Additional Follow-up for Phone Call Additional follow up Details #1::        i am just seeing this message, referral made as requested at this time Additional Follow-up by: Newt Lukes MD,  October 27, 2009 2:59 PM    Additional Follow-up for Phone Call Additional follow up Details #2::    Pt informed  Follow-up by: Lamar Sprinkles, CMA,  October 27, 2009 6:43 PM

## 2010-05-22 NOTE — Progress Notes (Signed)
Summary: Walking?   Phone Note Call from Patient Call back at [facility] Phone [phone]   Summary of Call: Pt left vm. Will walking make the fluid in her legs worse?  Initial call taken by: Lamar Sprinkles, CMA,  January 09, 2010 3:59 PM  Follow-up for Phone Call        NO Follow-up by: Jacques Navy MD,  January 09, 2010 6:14 PM  Additional Follow-up for Phone Call Additional follow up Details #1::        no answer/ no VM. Margaret Pyle, CMA  January 10, 2010 10:42 AM     Additional Follow-up for Phone Call Additional follow up Details #2::    Pt informed  Follow-up by: Lamar Sprinkles, CMA,  January 10, 2010 2:16 PM  Prescriptions: ALTACE 10 MG CAPS (RAMIPRIL) Take 1 tablet by mouth once a day  #30 x 5   Entered by:   Ami Bullins CMA   Authorized by:   Jacques Navy MD   Signed by:   Bill Salinas CMA on 01/10/2010   Method used:   Electronically to        [facility] (retail)       [address]       [place], [place]  [address]       Ph: [phone]       Fax: [phone]   RxID:   [id number]

## 2010-05-22 NOTE — Letter (Signed)
Summary: [facility]  [facility]   Imported By: Lester [place] 10/19/2009 09:08:21  _____________________________________________________________________  External Attachment:    Type:   Image     Comment:   External Document

## 2010-05-22 NOTE — Progress Notes (Signed)
  Phone Note Refill Request Message from:  Fax from Pharmacy on September 28, 2009 4:24 PM  Refills Requested: Medication #1:  NORVASC 10 MG TABS Take 1 tablet by mouth once a day   Last Refilled: 08/31/2009 Initial call taken by: Rock Nephew CMA,  September 28, 2009 4:24 PM    Prescriptions: NORVASC 10 MG TABS (AMLODIPINE BESYLATE) Take 1 tablet by mouth once a day  #30 x 5   Entered by:   Rock Nephew CMA   Authorized by:   Jacques Navy MD   Signed by:   Rock Nephew CMA on 09/28/2009   Method used:   Electronically to        [facility] (retail)       [address]       [place], [place]  [address]       Ph: [phone]       Fax: [phone]   RxID:   [id number]   Appended Document:     Prescriptions: OMEPRAZOLE 20 MG  TBEC (OMEPRAZOLE) 2 by mouth qam  #60 x 5   Entered by:   Rock Nephew CMA   Authorized by:   Jacques Navy MD   Signed by:   Rock Nephew CMA on 09/28/2009   Method used:   Faxed to ...       [facility] (retail)       [address]       [place], [place]  [address]       Ph: [phone]       Fax: [phone]   RxID:   [id number] SIMVASTATIN 40 MG  TABS (SIMVASTATIN) Take 1 tablet by mouth once a day  #30 x 5   Entered by:   Rock Nephew CMA   Authorized by:   Jacques Navy MD   Signed by:   Rock Nephew CMA on 09/28/2009   Method used:   Faxed to ...       [facility] (retail)       [address]       [place], [place]  [address]       Ph: [phone]       Fax: [phone]   RxID:   [id number]

## 2010-05-22 NOTE — Progress Notes (Signed)
  Phone Note Refill Request Message from:  Fax from Pharmacy on May 03, 2009 2:14 PM  Refills Requested: Medication #1:  LASIX 40 MG TABS Take 1 tablet by mouth once a day Initial call taken by: Ami Bullins CMA,  May 03, 2009 2:14 PM    Prescriptions: LASIX 40 MG TABS (FUROSEMIDE) Take 1 tablet by mouth once a day  #30 x 5   Entered by:   Ami Bullins CMA   Authorized by:   Jacques Navy MD   Signed by:   Bill Salinas CMA on 05/03/2009   Method used:   Electronically to        [facility] (retail)       [address]       [place], [place]  [address]       Ph: [phone]       Fax: [phone]   RxID:   [id number]

## 2010-05-22 NOTE — Progress Notes (Signed)
  Phone Note Refill Request Message from:  Fax from Pharmacy on November 02, 2009 8:57 AM  Refills Requested: Medication #1:  LANTUS SOLOSTAR 100 UNIT/ML SOLN 15 units at bedtime.   Last Refilled: 10/05/2009  Method Requested: Fax to Local Pharmacy Initial call taken by: Ami Bullins CMA,  November 02, 2009 8:57 AM    Prescriptions: LANTUS SOLOSTAR 100 UNIT/ML SOLN (INSULIN GLARGINE) 15 units subcutaneously at bedtime  #3 month supp x 3   Entered by:   Ami Bullins CMA   Authorized by:   Jacques Navy MD   Signed by:   Bill Salinas CMA on 11/02/2009   Method used:   Faxed to ...       [facility] (retail)       [address]       [place], [place]  [address]       Ph: [phone]       Fax: [phone]   RxID:   [id number]

## 2010-05-22 NOTE — Miscellaneous (Signed)
Summary: Plan/[facility]  Plan/[facility]   Imported By: Lester Ehrhardt 11/01/2009 08:13:59  _____________________________________________________________________  External Attachment:    Type:   Image     Comment:   External Document

## 2010-05-22 NOTE — Miscellaneous (Signed)
Summary: Face to Face Encounter / [facility]  Face to Face Encounter / [facility]   Imported By: Lennie Odor 11/08/2009 10:29:54  _____________________________________________________________________  External Attachment:    Type:   Image     Comment:   External Document

## 2010-05-22 NOTE — Progress Notes (Signed)
Summary: results/swelling  Phone Note Call from Patient Call back at Home Phone [phone]   Summary of Call: Patient would like to know about test results (I remember it was normal) and what she should do about the swelling all over her body. Please advise. Initial call taken by: Lucious Groves,  October 12, 2009 2:59 PM  Follow-up for Phone Call        test was normal. The "swelling" does not require any change in medication. I suspect the weight gain is a result of her psychotropic meds and she needs to consult her mental health provider.  Follow-up by: Jacques Navy MD,  October 12, 2009 5:45 PM  Additional Follow-up for Phone Call Additional follow up Details #1::        Patient notified. Additional Follow-up by: Lucious Groves,  October 13, 2009 9:58 AM

## 2010-05-22 NOTE — Progress Notes (Signed)
  Phone Note Refill Request Message from:  Fax from Pharmacy on October 06, 2009 8:38 AM  Refills Requested: Medication #1:  SIMVASTATIN 40 MG  TABS Take 1 tablet by mouth once a day Initial call taken by: Ami Bullins CMA,  October 06, 2009 8:38 AM    Prescriptions: SIMVASTATIN 40 MG  TABS (SIMVASTATIN) Take 1 tablet by mouth once a day  #30 x 5   Entered by:   Ami Bullins CMA   Authorized by:   Jacques Navy MD   Signed by:   Bill Salinas CMA on 10/06/2009   Method used:   Electronically to        [facility] (retail)       [address]       [place], [place]  [address]       Ph: [phone]       Fax: [phone]   RxID:   [id number] SIMVASTATIN 40 MG  TABS (SIMVASTATIN) Take 1 tablet by mouth once a day  #30 x 5   Entered by:   Bill Salinas CMA   Authorized by:   Jacques Navy MD   Signed by:   Bill Salinas CMA on 10/06/2009   Method used:   Electronically to        [facility] (retail)       [address]       [place], [place]  [address]       Ph: [phone]       Fax: [phone]   RxID:   [id number]

## 2010-05-22 NOTE — Assessment & Plan Note (Signed)
Summary: abd/thighs swollen/offered sda, no transportation/cd   Vital Signs:  Patient profile:   70 year old female Height:      63 inches Weight:      179 pounds BMI:     31.82 O2 Sat:      98 % on Room air Temp:     97.2 degrees F oral Pulse rate:   100 / minute BP sitting:   148 / 70  (left arm) Cuff size:   regular  Vitals Entered By: Bill Salinas CMA (January 09, 2010 10:55 AM)  O2 Flow:  Room air CC: ov for evaluation of swelling in arms and legs/ ab Comments pt declined flu shot   Primary Care Provider:  Madisan Bice  CC:  ov for evaluation of swelling in arms and legs/ ab.  History of Present Illness: Patient presents for follow-up. Her chief complaint is weight gain: extremities and truncal. She feels that she has gained her weight since hospitalization. Chart was reviewed:         Date                03/20/09   05/18/09  6/23   8/23   8/31   9/20        Weight               158          167      174     173    174    179  At last visit to discuss rise in A1C from 6.9% in June to 8% in August she was told to resume taking 70/30 insulin at 25 units two times a day. She has been doing this and her CBGs are better running from 90's to 125.  She denies any shortness of breath and she has been walking.   Current Medications (verified): 1)  Altace 10 Mg Caps (Ramipril) .... Take 1 Tablet By Mouth Once A Day 2)  Donepezil Hcl 10 Mg Tabs (Donepezil Hcl) .Marland Kitchen.. 1 By Mouth Once Daily 3)  Clonazepam 1 Mg Tabs (Clonazepam) .... Take  Tablet By Mouth Every Night 4)  Crestor 20 Mg Tabs (Rosuvastatin Calcium) .Marland Kitchen.. 1 By Mouth Once Daily 5)  Furosemide 40 Mg Tabs (Furosemide) .Marland Kitchen.. 1 By Mouth Once Daily 6)  Multiple Vitamin  Tabs (Multiple Vitamin) .... Take 1 Tablet By Mouth Once A Day 7)  Amlodipine Besylate 10 Mg Tabs (Amlodipine Besylate) .Marland Kitchen.. 1 By Mouth Once Daily 8)  Omeprazole 20 Mg  Tbec (Omeprazole) .... 2 By Mouth Qam 9)  Haloperidol 10 Mg  Tabs (Haloperidol) .Marland Kitchen.. 1 By Mouth At  Bedtime 10)  Bayer Aspirin 325 Mg  Tabs (Aspirin) .... Take 1 Tablet By Mouth Once A Day 11)  Benztropine Mesylate 0.5 Mg Tabs (Benztropine Mesylate) .Marland Kitchen.. 1 By Mouth Once Daily 12)  Amitriptyline Hcl 25 Mg  Tabs (Amitriptyline Hcl) .Marland Kitchen.. 1po Qhs 13)  Novolog Mix 70/30 Flexpen 70-30 % Susp (Insulin Aspart Prot & Aspart) .... 25 Units Two Times A Day/ Check Blood Sugar Before Taking Insulin/ Do Not Take  If Blood Sugar Is Less Than 80 14)  Clozapine 100 Mg Tabs (Clozapine) .Marland Kitchen.. 1 By Mouth At Bedtime 15)  Vitamin D3 1000 Unit  Tabs (Cholecalciferol) .Marland Kitchen.. 1 By Mouth Daily 16)  Potassium Chloride Cr 8 Meq Cr-Tabs (Potassium Chloride) .Marland Kitchen.. 1 Daily 17)  Acetaminophen 500 Mg Tabs (Acetaminophen) .Marland Kitchen.. 1 or 2 Tablets Three Times A Day As Needed For  Back Pain. 18)  Actos 30 Mg Tabs (Pioglitazone Hcl) .Marland Kitchen.. 1 By Mouth Once Daily 19)  Lantus Solostar 100 Unit/ml Soln (Insulin Glargine) .Marland Kitchen.. 15 Units At Bedtime 20)  Clozapine 25 Mg Tabs (Clozapine) .... 3 Tabs At Bedtime 21)  Namenda 10 Mg Tabs (Memantine Hcl) .Marland Kitchen.. 1 By Mouth Two Times A Day  Allergies (verified): 1)  ! * Vitamin C 2)  Amaryl (Glimepiride) 3)  Codeine Phosphate (Codeine Phosphate) 4)  Glyburide (Glyburide)  Past History:  Past Medical History: Last updated: 02/03/2009 Anemia-iron deficiency Anxiety Coronary artery disease Diabetes mellitus, type II GERD Hypertension Peripheral neuropathy schizoprenia Hyperlipidemia Allergic rhinitis Peripheral vascular disease - left subclavian 70% stenosis Dementia IBS hx of syncope - EP study 1996 with atrial tachycardia Esoph motility disorder 2010 Renal insufficiency  Past Surgical History: Last updated: 06/02/2007 Hysterectomy Percutaneous transluminal coronary angioplasty 1995 cath 3/98 s/p right knee arthroplasty 7/04 s/p right breast bx - neg PSH reviewed for relevance, FH reviewed for relevance  Review of Systems       The patient complains of weight gain and difficulty  walking.  The patient denies anorexia, fever, decreased hearing, chest pain, dyspnea on exertion, headaches, abdominal pain, severe indigestion/heartburn, muscle weakness, unusual weight change, and enlarged lymph nodes.    Physical Exam  General:  overweight AA female in no distress Head:  normocephalic and atraumatic.   Eyes:  pupils equal and pupils round.  C&S clea Mouth:  gingival hyperplasia. Poor dentition, Lip thickening (chronic) Neck:  No JVD Lungs:  normal respiratory effort, normal breath sounds, no crackles, and no wheezes.   Heart:  normal rate and regular rhythm.   Abdomen:  protruberant. No shifting dullness or fluid wave. No pitting edema Msk:  normal ROM, no joint tenderness, no joint swelling, and no joint warmth.   Pulses:  2+ radial Extremities:  1+ LE edema to distal leg Neurologic:  alert & oriented X3.  Speech unchanged. Needs 1+ assist to get up to exam table.  Skin:  turgor normal, no rashes, and no ulcerations.   Cervical Nodes:  no anterior cervical adenopathy and no posterior cervical adenopathy.   Psych:  Oriented X3, memory intact for recent and remote, normally interactive, and good eye contact.     Impression & Recommendations:  Problem # 1:  PERIPHERAL EDEMA (ICD-782.3) [place] with weight gain, with a 5 lb gain over a short period of time. She also has elevated systolic BP.  Plan - increase furosemide to 40mg  AM and 20mg  PM           She will need follow-up Bmet in 3-4 weeks.   Her updated medication list for this problem includes:    Furosemide 40 Mg Tabs (Furosemide) .Marland Kitchen... 1 by mouth once daily    Furosemide 20 Mg Tabs (Furosemide) .Marland Kitchen... 1 by mouth qpm for management of systolic bp and fluid accumulation  Complete Medication List: 1)  Altace 10 Mg Caps (Ramipril) .... Take 1 tablet by mouth once a day 2)  Donepezil Hcl 10 Mg Tabs (Donepezil hcl) .Marland Kitchen.. 1 by mouth once daily 3)  Clonazepam 1 Mg Tabs (Clonazepam) .... Take  tablet by mouth every  night 4)  Crestor 20 Mg Tabs (Rosuvastatin calcium) .Marland Kitchen.. 1 by mouth once daily 5)  Furosemide 40 Mg Tabs (Furosemide) .Marland Kitchen.. 1 by mouth once daily 6)  Multiple Vitamin Tabs (Multiple vitamin) .... Take 1 tablet by mouth once a day 7)  Amlodipine Besylate 10 Mg Tabs (Amlodipine besylate) .Marland Kitchen.. 1 by  mouth once daily 8)  Omeprazole 20 Mg Tbec (Omeprazole) .... 2 by mouth qam 9)  Haloperidol 10 Mg Tabs (Haloperidol) .Marland Kitchen.. 1 by mouth at bedtime 10)  Bayer Aspirin 325 Mg Tabs (Aspirin) .... Take 1 tablet by mouth once a day 11)  Benztropine Mesylate 0.5 Mg Tabs (Benztropine mesylate) .Marland Kitchen.. 1 by mouth once daily 12)  Amitriptyline Hcl 25 Mg Tabs (Amitriptyline hcl) .Marland Kitchen.. 1po qhs 13)  Novolog Mix 70/30 Flexpen 70-30 % Susp (Insulin aspart prot & aspart) .... 25 units two times a day/ check blood sugar before taking insulin/ do not take  if blood sugar is less than 80 14)  Clozapine 100 Mg Tabs (Clozapine) .Marland Kitchen.. 1 by mouth at bedtime 15)  Vitamin D3 1000 Unit Tabs (Cholecalciferol) .Marland Kitchen.. 1 by mouth daily 16)  Potassium Chloride Cr 8 Meq Cr-tabs (Potassium chloride) .Marland Kitchen.. 1 daily 17)  Acetaminophen 500 Mg Tabs (Acetaminophen) .Marland Kitchen.. 1 or 2 tablets three times a day as needed for back pain. 18)  Actos 30 Mg Tabs (Pioglitazone hcl) .Marland Kitchen.. 1 by mouth once daily 19)  Lantus Solostar 100 Unit/ml Soln (Insulin glargine) .Marland Kitchen.. 15 units at bedtime 20)  Clozapine 25 Mg Tabs (Clozapine) .... 3 tabs at bedtime 21)  Namenda 10 Mg Tabs (Memantine hcl) .Marland Kitchen.. 1 by mouth two times a day 22)  Furosemide 20 Mg Tabs (Furosemide) .Marland Kitchen.. 1 by mouth qpm for management of systolic bp and fluid accumulation Prescriptions: POTASSIUM CHLORIDE CR 8 MEQ CR-TABS (POTASSIUM CHLORIDE) 1 daily  #30 x 12   Entered by:   Ami Bullins CMA   Authorized by:   Jacques Navy MD   Signed by:   Bill Salinas CMA on 01/09/2010   Method used:   Electronically to        [facility] (retail)       [address]       [place], [place]  [address]       Ph: [phone]       Fax: [phone]   RxID:   [id number] CRESTOR 20 MG TABS (ROSUVASTATIN CALCIUM) 1 by mouth once daily  #30 x 6   Entered by:   Bill Salinas CMA   Authorized by:   Jacques Navy MD   Signed by:   Bill Salinas CMA on 01/09/2010   Method used:   Electronically to        [facility] (retail)       [address]       [place], [place]  [address]       Ph: [phone]       Fax: [phone]   RxID:   [id number] FUROSEMIDE 20 MG TABS (FUROSEMIDE) 1 by mouth qPM for management of systolic BP and fluid accumulation  #30 x 5   Entered and Authorized by:   Jacques Navy MD   Signed by:   Jacques Navy MD on 01/09/2010   Method used:   Electronically to        [facility] (retail)       [address]       [place], [place]  [address]       Ph: [phone]       Fax: [phone]   RxID:   [id number]

## 2010-05-22 NOTE — Assessment & Plan Note (Signed)
Summary: post hospital/#/cd   Vital Signs:  Patient profile:   70 year old female Height:      63 inches Weight:      174 pounds BMI:     30.93 O2 Sat:      97 % on Room air Temp:     97.4 degrees F oral Pulse rate:   102 / minute BP sitting:   140 / 80  (left arm) Cuff size:   regular  Vitals Entered By: Bill Salinas CMA (October 12, 2009 10:13 AM)  O2 Flow:  Room air CC: pt here with c/o bilateral leg/feet swelling/ ab   Primary Care Provider:  Menna Abeln  CC:  pt here with c/o bilateral leg/feet swelling/ ab.  History of Present Illness: post-hospital f/u: adm for N/V, diarrhea. She had weakness and poor ambulation tolerance in hospital and was sent home, declince SNF, with HH-PT. At her hospital stay she was noted to have put on a lot of weight and appear bloated. this did coincide with new psych meds.   Patient presents today c/o swelling in the left proximal LE. She denies SOB, C/P.  Current Medications (verified): 1)  Altace 10 Mg Caps (Ramipril) .... Take 1 Tablet By Mouth Once A Day 2)  Aricept 10 Mg  Tabs (Donepezil Hcl) .... Take 1 Tablet By Mouth Once A Day 3)  Clonazepam 1 Mg Tabs (Clonazepam) .... Take 2 Tablet By Mouth Every Night 4)  Simvastatin 40 Mg  Tabs (Simvastatin) .... Take 1 Tablet By Mouth Once A Day 5)  Lasix 40 Mg Tabs (Furosemide) .... Take 1 Tablet By Mouth Once A Day 6)  Multiple Vitamin  Tabs (Multiple Vitamin) .... Take 1 Tablet By Mouth Once A Day 7)  Norvasc 10 Mg Tabs (Amlodipine Besylate) .... Take 1 Tablet By Mouth Once A Day 8)  Omeprazole 20 Mg  Tbec (Omeprazole) .... 2 By Mouth Qam 9)  Haldol 5 Mg/ml  Soln (Haloperidol Lactate) .... Twice A Month 10)  Haloperidol 10 Mg  Tabs (Haloperidol) .... Take 1 By Mouth Qam and 2 By Mouth Qhs 11)  Bayer Aspirin 325 Mg  Tabs (Aspirin) .... Take 1 Tablet By Mouth Once A Day 12)  Cogentin 0.5mg  .... Take 1 Tab By Mouth At Bedtime 13)  Glyburide 5 Mg  Tabs (Glyburide) .... 2 By Mouth Qd 14)  Amitriptyline  Hcl 25 Mg  Tabs (Amitriptyline Hcl) .Marland Kitchen.. 1po Qhs 15)  Novolog Mix 70/30 Flexpen 70-30 % Susp (Insulin Aspart Prot & Aspart) .... 25 Units Two Times A Day 16)  Clozaril 100 Mg Tabs (Clozapine) .... Take 1 Tab By Mouth At Bedtime 17)  Clozaril 25 Mg Tabs (Clozapine) .... Take 3 Tab By Mouth At Bedtime 18)  Metoprolol Succinate 25 Mg Xr24h-Tab (Metoprolol Succinate) .... 1/2 Tab Once Daily For Bp 19)  Vitamin D3 1000 Unit  Tabs (Cholecalciferol) .Marland Kitchen.. 1 By Mouth Daily 20)  Potassium Chloride Cr 8 Meq Cr-Tabs (Potassium Chloride) .Marland Kitchen.. 1 Daily 21)  Acetaminophen 500 Mg Tabs (Acetaminophen) .Marland Kitchen.. 1 or 2 Tablets Three Times A Day As Needed For Back Pain. 22)  Actos 30 Mg Tabs (Pioglitazone Hcl) .Marland Kitchen.. 1 By Mouth Once Daily 23)  Lantus Solostar 100 Unit/ml Soln (Insulin Glargine) .Marland Kitchen.. 15 Units Subcutaneously At Bedtime 24)  Lantus Solostar 100 Unit/ml Soln (Insulin Glargine) .Marland Kitchen.. 15 Units At Bedtime  Allergies (verified): 1)  ! * Vitamin C 2)  Amaryl (Glimepiride) 3)  Codeine Phosphate (Codeine Phosphate) 4)  Glyburide (Glyburide)  Past  History:  Past Medical History: Last updated: 02/03/2009 Anemia-iron deficiency Anxiety Coronary artery disease Diabetes mellitus, type II GERD Hypertension Peripheral neuropathy schizoprenia Hyperlipidemia Allergic rhinitis Peripheral vascular disease - left subclavian 70% stenosis Dementia IBS hx of syncope - EP study 1996 with atrial tachycardia Esoph motility disorder 2010 Renal insufficiency  Past Surgical History: Last updated: 06/02/2007 Hysterectomy Percutaneous transluminal coronary angioplasty 1995 cath 3/98 s/p right knee arthroplasty 7/04 s/p right breast bx - neg  Family History: Last updated: 10/26/2007 Neg-breast or colon cancer; CAD; schizophrenia Pos - DM, HTN  Social History: Last updated: 10/26/2007 11th grade education. Never married 5 chldren: 4 daughters, 1 son; 7 grandchildren. lives alone. Has a supportive  family.  Risk Factors: Caffeine Use: 0 (10/26/2007) Exercise: no (10/26/2007)  Risk Factors: Smoking Status: quit (02/03/2009) Passive Smoke Exposure: no (10/26/2007)  Review of Systems       The patient complains of weight gain and abdominal pain.  The patient denies anorexia, fever, chest pain, syncope, dyspnea on exertion, hematochezia, difficulty walking, and abnormal bleeding.    Physical Exam  General:  overwieght somewhat bloated appearing AA female. Head:  normocephalic and atraumatic.   Eyes:  pupils equal and pupils round.   Lungs:  normal respiratory effort and normal breath sounds.   Heart:  normal rate and regular rhythm.   Msk:  Left thigh - 56 inches  right thigh 55 inches left calve -15 inches   right calve 14.5 inches Pulses:  2+ radial Neurologic:  alert & oriented X3 and cranial nerves II-XII intact.     Impression & Recommendations:  Problem # 1:  PERIPHERAL EDEMA (ICD-782.3) Patient with peripheral edema left LE > right LE. Need to r/o DVT  Plan - referred immediately to [facility] lab for LE venous doppler  Her updated medication list for this problem includes:    Lasix 40 Mg Tabs (Furosemide) .Marland Kitchen... Take 1 tablet by mouth once a day  Addendum - called report: negative study. Pt sent home.  Plan - patient to seek help from mental health provided re: new meds as cause of fluid retention.  Complete Medication List: 1)  Altace 10 Mg Caps (Ramipril) .... Take 1 tablet by mouth once a day 2)  Aricept 10 Mg Tabs (Donepezil hcl) .... Take 1 tablet by mouth once a day 3)  Clonazepam 1 Mg Tabs (Clonazepam) .... Take 2 tablet by mouth every night 4)  Simvastatin 40 Mg Tabs (Simvastatin) .... Take 1 tablet by mouth once a day 5)  Lasix 40 Mg Tabs (Furosemide) .... Take 1 tablet by mouth once a day 6)  Multiple Vitamin Tabs (Multiple vitamin) .... Take 1 tablet by mouth once a day 7)  Norvasc 10 Mg Tabs (Amlodipine besylate) .... Take 1 tablet by mouth once  a day 8)  Omeprazole 20 Mg Tbec (Omeprazole) .... 2 by mouth qam 9)  Haldol 5 Mg/ml Soln (Haloperidol lactate) .... Twice a month 10)  Haloperidol 10 Mg Tabs (Haloperidol) .... Take 1 by mouth qam and 2 by mouth qhs 11)  Bayer Aspirin 325 Mg Tabs (Aspirin) .... Take 1 tablet by mouth once a day 12)  Cogentin 0.5mg   .... Take 1 tab by mouth at bedtime 13)  Glyburide 5 Mg Tabs (Glyburide) .... 2 by mouth qd 14)  Amitriptyline Hcl 25 Mg Tabs (Amitriptyline hcl) .Marland Kitchen.. 1po qhs 15)  Novolog Mix 70/30 Flexpen 70-30 % Susp (Insulin aspart prot & aspart) .... 25 units two times a day 16)  Clozaril  100 Mg Tabs (Clozapine) .... Take 1 tab by mouth at bedtime 17)  Clozaril 25 Mg Tabs (Clozapine) .... Take 3 tab by mouth at bedtime 18)  Metoprolol Succinate 25 Mg Xr24h-tab (Metoprolol succinate) .... 1/2 tab once daily for bp 19)  Vitamin D3 1000 Unit Tabs (Cholecalciferol) .Marland Kitchen.. 1 by mouth daily 20)  Potassium Chloride Cr 8 Meq Cr-tabs (Potassium chloride) .Marland Kitchen.. 1 daily 21)  Acetaminophen 500 Mg Tabs (Acetaminophen) .Marland Kitchen.. 1 or 2 tablets three times a day as needed for back pain. 22)  Actos 30 Mg Tabs (Pioglitazone hcl) .Marland Kitchen.. 1 by mouth once daily 23)  Lantus Solostar 100 Unit/ml Soln (Insulin glargine) .Marland Kitchen.. 15 units subcutaneously at bedtime 24)  Lantus Solostar 100 Unit/ml Soln (Insulin glargine) .Marland Kitchen.. 15 units at bedtime

## 2010-05-22 NOTE — Assessment & Plan Note (Signed)
Summary: PER FLAG/MD--OV--LAB RESULTS AND DIABETE MANAG--STC   Vital Signs:  Patient profile:   70 year old female Height:      63 inches Weight:      174 pounds BMI:     30.93 O2 Sat:      97 % on Room air Temp:     96.6 degrees F oral Pulse rate:   114 / minute BP sitting:   142 / 80  (left arm) Cuff size:   regular  Vitals Entered By: Bill Salinas CMA (December 20, 2009 10:54 AM)  O2 Flow:  Room air Comments Patient came back into the office requesting clarification of novolog. She is concerned that she may have too low cbg's and the dose will be too much. Per MD, no insulin if cbg is <80. I updated pt's medication instructions and gave her the new print out. Also explained this to pt and her daughter. They will also send her med list to her phamacy who fills her pill boxes to verify we are all on the same page...............Marland KitchenLamar Sprinkles, CMA  December 20, 2009 12:31 PM    Primary Care Provider:  Minerva Bluett   History of Present Illness: Patient presents for follow-up of recent lab: A1C 8%. C urrent meds include actos 30mg  once a day, Lantus 15 units qhs  and a sliding scale for novolog. Med list shows novolog 70/30 25 units two times a day. Reviewed Med Rec discharge instructions: listed actos 15, lantus 15units at bedtime and novolog 25 units two times a day. Reviewed list patient brougth with her that is provided by [facility]. There are differences between [facility] list, hosp list and [facility] list. Will reconcile to list of meds  concordant with [facility] plus novolog 70/30 25 units two times a day and lantus 15units at bedtime.    Current Medications (verified): 1)  Altace 10 Mg Caps (Ramipril) .... Take 1 Tablet By Mouth Once A Day 2)  Donepezil Hcl 10 Mg Tabs (Donepezil Hcl) .Marland Kitchen.. 1 By Mouth Once Daily 3)  Clonazepam 1 Mg Tabs (Clonazepam) .... Take 2 Tablet By Mouth Every Night 4)  Crestor 20 Mg Tabs (Rosuvastatin Calcium) .Marland Kitchen.. 1 By Mouth Once Daily 5)  Furosemide 40 Mg Tabs (Furosemide) .Marland Kitchen.. 1 By Mouth  Once Daily 6)  Multiple Vitamin  Tabs (Multiple Vitamin) .... Take 1 Tablet By Mouth Once A Day 7)  Amlodipine Besylate 10 Mg Tabs (Amlodipine Besylate) .Marland Kitchen.. 1 By Mouth Once Daily 8)  Omeprazole 20 Mg  Tbec (Omeprazole) .... 2 By Mouth Qam 9)  Haloperidol 10 Mg  Tabs (Haloperidol) .Marland Kitchen.. 1 By Mouth At Bedtime 10)  Bayer Aspirin 325 Mg  Tabs (Aspirin) .... Take 1 Tablet By Mouth Once A Day 11)  Benztropine Mesylate 0.5 Mg Tabs (Benztropine Mesylate) .Marland Kitchen.. 1 By Mouth Once Daily 12)  Amitriptyline Hcl 25 Mg  Tabs (Amitriptyline Hcl) .Marland Kitchen.. 1po Qhs 13)  Novolog Mix 70/30 Flexpen 70-30 % Susp (Insulin Aspart Prot & Aspart) .... 25 Units Two Times A Day 14)  Clozapine 100 Mg Tabs (Clozapine) .Marland Kitchen.. 1 By Mouth At Bedtime 15)  Vitamin D3 1000 Unit  Tabs (Cholecalciferol) .Marland Kitchen.. 1 By Mouth Daily 16)  Potassium Chloride Cr 8 Meq Cr-Tabs (Potassium Chloride) .Marland Kitchen.. 1 Daily 17)  Acetaminophen 500 Mg Tabs (Acetaminophen) .Marland Kitchen.. 1 or 2 Tablets Three Times A Day As Needed For Back Pain. 18)  Actos 30 Mg Tabs (Pioglitazone Hcl) .Marland Kitchen.. 1 By Mouth Once Daily 19)  Lantus Solostar 100 Unit/ml Soln (Insulin  Glargine) .Marland Kitchen.. 15 Units At Bedtime 20)  Clozapine 25 Mg Tabs (Clozapine) .... 3 Tabs At Bedtime  Allergies (verified): 1)  ! * Vitamin C 2)  Amaryl (Glimepiride) 3)  Codeine Phosphate (Codeine Phosphate) 4)  Glyburide (Glyburide)  Past History:  Past Medical History: Last updated: 02/03/2009 Anemia-iron deficiency Anxiety Coronary artery disease Diabetes mellitus, type II GERD Hypertension Peripheral neuropathy schizoprenia Hyperlipidemia Allergic rhinitis Peripheral vascular disease - left subclavian 70% stenosis Dementia IBS hx of syncope - EP study 1996 with atrial tachycardia Esoph motility disorder 2010 Renal insufficiency  Past Surgical History: Last updated: 06/02/2007 Hysterectomy Percutaneous transluminal coronary angioplasty 1995 cath 3/98 s/p right knee arthroplasty 7/04 s/p right breast  bx - neg  Family History: Last updated: 10/26/2007 Neg-breast or colon cancer; CAD; schizophrenia Pos - DM, HTN  Social History: Last updated: 10/26/2007 11th grade education. Never married 5 chldren: 4 daughters, 1 son; 7 grandchildren. lives alone. Has a supportive family.  Review of Systems  The patient denies anorexia, fever, weight loss, chest pain, syncope, dyspnea on exertion, abdominal pain, muscle weakness, suspicious skin lesions, difficulty walking, depression, and enlarged lymph nodes.    Physical Exam  General:  Well-developed,well-nourished,in no acute distress; alert,appropriate and cooperative throughout examination Head:  normocephalic and atraumatic.   Eyes:  C&S clear Mouth:  missing many teeth, no oral lesions Neck:  supple.   Lungs:  normal respiratory effort.   Heart:  normal rate and regular rhythm.   Skin:  turgor normal and color normal.   Psych:  Oriented X3 and good eye contact.     Impression & Recommendations:  Problem # 1:  DIABETES MELLITUS, TYPE II (ICD-250.00) Patient was taking Novolog 70/30 on a sliding scale!. Could not find an order for this. Reviewed hospital records and med rec d/c sheet and med list from home. These lists were all different. spent 30 minutes on medication reconciliation then called her pharmacy to insure that they had an updated med list. Provided the patient with a copy of her current meds.  Plan - take 70/30 25 u two times a day as instructed.          continue other meds.   Her updated medication list for this problem includes:    Altace 10 Mg Caps (Ramipril) .Marland Kitchen... Take 1 tablet by mouth once a day    Bayer Aspirin 325 Mg Tabs (Aspirin) .Marland Kitchen... Take 1 tablet by mouth once a day    Novolog Mix 70/30 Flexpen 70-30 % Susp (Insulin aspart prot & aspart) .Marland Kitchen... 25 units two times a day/ check blood sugar before taking insulin/ do not take  if blood sugar is less than 80    Actos 30 Mg Tabs (Pioglitazone hcl) .Marland Kitchen... 1 by  mouth once daily    Lantus Solostar 100 Unit/ml Soln (Insulin glargine) .Marland KitchenMarland KitchenMarland KitchenMarland Kitchen 15 units at bedtime  Complete Medication List: 1)  Altace 10 Mg Caps (Ramipril) .... Take 1 tablet by mouth once a day 2)  Donepezil Hcl 10 Mg Tabs (Donepezil hcl) .Marland Kitchen.. 1 by mouth once daily 3)  Clonazepam 1 Mg Tabs (Clonazepam) .... Take  tablet by mouth every night 4)  Crestor 20 Mg Tabs (Rosuvastatin calcium) .Marland Kitchen.. 1 by mouth once daily 5)  Furosemide 40 Mg Tabs (Furosemide) .Marland Kitchen.. 1 by mouth once daily 6)  Multiple Vitamin Tabs (Multiple vitamin) .... Take 1 tablet by mouth once a day 7)  Amlodipine Besylate 10 Mg Tabs (Amlodipine besylate) .Marland Kitchen.. 1 by mouth once daily 8)  Omeprazole  20 Mg Tbec (Omeprazole) .... 2 by mouth qam 9)  Haloperidol 10 Mg Tabs (Haloperidol) .Marland Kitchen.. 1 by mouth at bedtime 10)  Bayer Aspirin 325 Mg Tabs (Aspirin) .... Take 1 tablet by mouth once a day 11)  Benztropine Mesylate 0.5 Mg Tabs (Benztropine mesylate) .Marland Kitchen.. 1 by mouth once daily 12)  Amitriptyline Hcl 25 Mg Tabs (Amitriptyline hcl) .Marland Kitchen.. 1po qhs 13)  Novolog Mix 70/30 Flexpen 70-30 % Susp (Insulin aspart prot & aspart) .... 25 units two times a day/ check blood sugar before taking insulin/ do not take  if blood sugar is less than 80 14)  Clozapine 100 Mg Tabs (Clozapine) .Marland Kitchen.. 1 by mouth at bedtime 15)  Vitamin D3 1000 Unit Tabs (Cholecalciferol) .Marland Kitchen.. 1 by mouth daily 16)  Potassium Chloride Cr 8 Meq Cr-tabs (Potassium chloride) .Marland Kitchen.. 1 daily 17)  Acetaminophen 500 Mg Tabs (Acetaminophen) .Marland Kitchen.. 1 or 2 tablets three times a day as needed for back pain. 18)  Actos 30 Mg Tabs (Pioglitazone hcl) .Marland Kitchen.. 1 by mouth once daily 19)  Lantus Solostar 100 Unit/ml Soln (Insulin glargine) .Marland Kitchen.. 15 units at bedtime 20)  Clozapine 25 Mg Tabs (Clozapine) .... 3 tabs at bedtime 21)  Namenda 10 Mg Tabs (Memantine hcl) .Marland Kitchen.. 1 by mouth two times a day  Patient Instructions: 1)  reviewed all medications and have reconciled all lists. The list below should be  everything you are taking. Compare what you have at home to this list.

## 2010-05-22 NOTE — Progress Notes (Signed)
Summary: Furosemide   Phone Note Call from Patient   Caller: Daughter - [phone] Bethann Berkshire Summary of Call: Pt's daughter called. Pt was determined that she needs an extra 20mg  of fluid pill at office visit. Now patient thinks that the pill is causing difficulty sleeping and it is affecting her cbgs. Pt wants to stop the furosemide. Please advise.  Initial call taken by: Lamar Sprinkles, CMA,  February 15, 2010 10:39 AM  Follow-up for Phone Call        Vikki Ports - other daughter called requesting that lasix be stopped b/c it is causing elevated cbgs.  Also req referral for a nutrionist to come to pt's home for eval - Home health referral? Paitent may qualify?  Follow-up by: Lamar Sprinkles, CMA,  February 15, 2010 10:49 AM  Additional Follow-up for Phone Call Additional follow up Details #1::        furosemide does not affect blood sugar.  OK for home health referral for in-home dietary education (never heard of this being an offered service) Additional Follow-up by: Jacques Navy MD,  February 15, 2010 1:12 PM    Additional Follow-up for Phone Call Additional follow up Details #2::    No answer on pt's home number..................Marland KitchenLamar Sprinkles, CMA  February 16, 2010 11:13 AM   Spoke w/Valerie([phone]), She says that MD was worried that the furosemide would cause increase in CBG's when discussing this at office visit. Daughter would like pt to stop med and see a diabetic nutrionist. Please put in referral. Ok for pt to stop the PM furosemide? ...................Marland KitchenLamar Sprinkles, CMA  February 16, 2010 5:01 PM   Additional Follow-up for Phone Call Additional follow up Details #3:: Details for Additional Follow-up Action Taken: OK for referral to nutritionist. Do Not stop furosemide. [facility] notified  Pt informed...................Marland KitchenLamar Sprinkles, CMA  February 19, 2010 12:27 PM  Additional Follow-up by: Jacques Navy MD,  February 17, 2010 11:43 AM

## 2010-05-22 NOTE — Progress Notes (Signed)
Summary: [facility]  Phone Note From Other Clinic   Summary of Call: [facility] called. FYI pt no longer needs nursing services. Physical therapy will continue services for now.  Initial call taken by: Lamar Sprinkles, CMA,  October 24, 2009 2:29 PM

## 2010-05-22 NOTE — Progress Notes (Signed)
  Phone Note Refill Request Message from:  Fax from Pharmacy on December 21, 2009 2:58 PM  Refills Requested: Medication #1:  LANTUS SOLOSTAR 100 UNIT/ML SOLN 15 units at bedtime   Last Refilled: 10/05/2009 received fax from [facility].  Initial call taken by: Ami Bullins CMA,  December 21, 2009 2:59 PM    Prescriptions: LANTUS SOLOSTAR 100 UNIT/ML SOLN (INSULIN GLARGINE) 15 units at bedtime  #3 mth x 4   Entered by:   Ami Bullins CMA   Authorized by:   Jacques Navy MD   Signed by:   Bill Salinas CMA on 12/21/2009   Method used:   Electronically to        [facility] (retail)       [address]       [place], [place]  [address]       Ph: [phone]       Fax: [phone]   RxID:   [id number]

## 2010-05-22 NOTE — Progress Notes (Signed)
Summary: Actos  Phone Note Refill Request Message from:  Patient on May 26, 2009 3:41 PM  Patient is calling for refill of Actos. This is not on patient med list. Please advise.  Initial call taken by: Lucious Groves,  May 26, 2009 3:44 PM  Follow-up for Phone Call        actos prescribed at October '10 hospital discharge- 30mg  once daily. Added to med list and Rx faxed to [facility] drug. Pt notified Follow-up by: Jacques Navy MD,  May 26, 2009 5:31 PM    New/Updated Medications: ACTOS 30 MG TABS (PIOGLITAZONE HCL) 1 by mouth once daily Prescriptions: ACTOS 30 MG TABS (PIOGLITAZONE HCL) 1 by mouth once daily  #30 x 12   Entered and Authorized by:   Jacques Navy MD   Signed by:   Jacques Navy MD on 05/26/2009   Method used:   Faxed to ...       [facility] (retail)       [address]       [place], [place]  [address]       Ph: [phone]       Fax: [phone]   RxID:   [id number]

## 2010-05-22 NOTE — Progress Notes (Signed)
Summary: WEAK  Phone Note Call from Patient   Summary of Call: Pt's daughter called. Pt was found on the floor this am while trying to go to the bathroom. She was alert but too weak to get up on her own. She has CNA in the home a few hours every am. Spoke w/CNA.  Pt c/o "stomach virus" since sunday w/abd discomfort and loose stools. Has decreased intake. BP yesterday was 140/68. Today 164/64. I asked for her to check HR but call was ended w/NA due to having to assist w/movement of patient to use the bathroom. I will call back shortly.   They will need ambulance transport due to patient not being able to stand on her own. Do you want to see pt here for office visit? admit? or ER?  Initial call taken by: Lamar Sprinkles, CMA,  October 04, 2009 9:45 AM  Follow-up for Phone Call        ER per MD, Spoke w/daughter and they already called, pt will be transported to ER now.  Follow-up by: Lamar Sprinkles, CMA,  October 04, 2009 10:04 AM  Additional Follow-up for Phone Call Additional follow up Details #1::        k Additional Follow-up by: Jacques Navy MD,  October 04, 2009 11:18 AM

## 2010-05-24 NOTE — Letter (Signed)
Summary: Patient Cancelled Appointment with Dietitian / [place]  Patient Cancelled Appointment with Dietitian / [place]   Imported By: Lennie Odor 04/24/2010 13:55:45  _____________________________________________________________________  External Attachment:    Type:   Image     Comment:   External Document

## 2010-05-24 NOTE — Progress Notes (Signed)
Summary: NEW RX  Phone Note Call from Patient Call back at Home Phone [phone]   Summary of Call: Needs rx for accucheck meter & supplies to [facility]. Initial call taken by: Lamar Sprinkles, CMA,  May 02, 2010 11:26 AM    New/Updated Medications: ACCU-CHEK SOFT TOUCH DEVICE  MISC (LANCET DEVICES) as directed * ACCU CHECK Test strips and lancets test two times a day as needed Prescriptions: ACCU CHECK Test strips and lancets test two times a day as needed  #3 mth x 3   Entered by:   Lamar Sprinkles, CMA   Authorized by:   Jacques Navy MD   Signed by:   Lamar Sprinkles, CMA on 05/02/2010   Method used:   Faxed to ...       [facility] (retail)       [address]       [place], [place]  [address]       Ph: [phone]       Fax: [phone]   RxID:   [id number] ACCU-CHEK SOFT TOUCH DEVICE  MISC (LANCET DEVICES) as directed  #1 x 0   Entered by:   Lamar Sprinkles, CMA   Authorized by:   Jacques Navy MD   Signed by:   Lamar Sprinkles, CMA on 05/02/2010   Method used:   Electronically to        [facility] (retail)       [address]       [place], [place]  [address]       Ph: [phone]       Fax: [phone]   RxID:   [id number]

## 2010-05-26 ENCOUNTER — Encounter: Payer: Self-pay | Admitting: Internal Medicine

## 2010-05-31 ENCOUNTER — Telehealth: Payer: Self-pay | Admitting: Internal Medicine

## 2010-06-07 NOTE — Medication Information (Signed)
Summary: [facility]  [facility]   Imported By: Sherian Rein 05/31/2010 12:54:18  _____________________________________________________________________  External Attachment:    Type:   Image     Comment:   External Document

## 2010-06-07 NOTE — Progress Notes (Signed)
Summary: referral  Phone Note Call from Patient Call back at Home Phone [phone]   Caller: Patient Summary of Call: Patient called [facility] requesting referral to see Dr Stacey Drain for back pain.Alvy Beal Archie CMA  May 31, 2010 12:57 PM

## 2010-06-13 ENCOUNTER — Telehealth: Payer: Self-pay | Admitting: Internal Medicine

## 2010-06-19 NOTE — Progress Notes (Signed)
Summary: REFERRAL  Phone Note Call from Patient   Caller: Vikki Ports [phone] Summary of Call: Patient is requesting referral to Dr Kellie Simmering for her back pain.  Initial call taken by: Lamar Sprinkles, CMA,  June 13, 2010 2:24 PM  Follow-up for Phone Call        chart reveiwed: no workup or evidence for rheumatologic disease. Needs ov and appropriate workup prior to referral. Follow-up by: Jacques Navy MD,  June 13, 2010 5:15 PM  Additional Follow-up for Phone Call Additional follow up Details #1::        Pt informed, wil call to schedule  Additional Follow-up by: Lamar Sprinkles, CMA,  June 13, 2010 5:54 PM

## 2010-07-08 LAB — GLUCOSE, CAPILLARY
Glucose-Capillary: 109 mg/dL — ABNORMAL HIGH (ref 70–99)
Glucose-Capillary: 111 mg/dL — ABNORMAL HIGH (ref 70–99)
Glucose-Capillary: 169 mg/dL — ABNORMAL HIGH (ref 70–99)
Glucose-Capillary: 181 mg/dL — ABNORMAL HIGH (ref 70–99)
Glucose-Capillary: 191 mg/dL — ABNORMAL HIGH (ref 70–99)
Glucose-Capillary: 192 mg/dL — ABNORMAL HIGH (ref 70–99)
Glucose-Capillary: 203 mg/dL — ABNORMAL HIGH (ref 70–99)
Glucose-Capillary: 77 mg/dL (ref 70–99)
Glucose-Capillary: 84 mg/dL (ref 70–99)

## 2010-07-08 LAB — BASIC METABOLIC PANEL
BUN: 10 mg/dL (ref 6–23)
CO2: 20 mEq/L (ref 19–32)
Chloride: 112 mEq/L (ref 96–112)
Creatinine, Ser: 0.99 mg/dL (ref 0.4–1.2)
Glucose, Bld: 73 mg/dL (ref 70–99)

## 2010-07-08 LAB — CBC
HCT: 28.8 % — ABNORMAL LOW (ref 36.0–46.0)
HCT: 33.5 % — ABNORMAL LOW (ref 36.0–46.0)
Hemoglobin: 11.6 g/dL — ABNORMAL LOW (ref 12.0–15.0)
MCHC: 34.2 g/dL (ref 30.0–36.0)
MCV: 94.6 fL (ref 78.0–100.0)
MCV: 97.1 fL (ref 78.0–100.0)
Platelets: 216 10*3/uL (ref 150–400)
Platelets: 234 10*3/uL (ref 150–400)
RBC: 3.45 MIL/uL — ABNORMAL LOW (ref 3.87–5.11)
RDW: 13.8 % (ref 11.5–15.5)
WBC: 14.6 10*3/uL — ABNORMAL HIGH (ref 4.0–10.5)

## 2010-07-08 LAB — DIFFERENTIAL
Basophils Absolute: 0.1 10*3/uL (ref 0.0–0.1)
Eosinophils Absolute: 0 10*3/uL (ref 0.0–0.7)
Lymphocytes Relative: 13 % (ref 12–46)
Lymphs Abs: 1.9 10*3/uL (ref 0.7–4.0)
Monocytes Relative: 7 % (ref 3–12)
Neutro Abs: 11.6 10*3/uL — ABNORMAL HIGH (ref 1.7–7.7)

## 2010-07-08 LAB — COMPREHENSIVE METABOLIC PANEL
Albumin: 3.7 g/dL (ref 3.5–5.2)
Alkaline Phosphatase: 72 U/L (ref 39–117)
BUN: 19 mg/dL (ref 6–23)
CO2: 23 mEq/L (ref 19–32)
Chloride: 110 mEq/L (ref 96–112)
Creatinine, Ser: 1.23 mg/dL — ABNORMAL HIGH (ref 0.4–1.2)
GFR calc non Af Amer: 43 mL/min — ABNORMAL LOW (ref >60)
Potassium: 4.7 mEq/L (ref 3.5–5.1)
Total Bilirubin: 0.8 mg/dL (ref 0.3–1.2)

## 2010-07-08 LAB — URINALYSIS, ROUTINE W REFLEX MICROSCOPIC
Bilirubin Urine: NEGATIVE
Hgb urine dipstick: NEGATIVE
Ketones, ur: NEGATIVE mg/dL
Nitrite: NEGATIVE
Protein, ur: NEGATIVE mg/dL
Specific Gravity, Urine: 1.024 (ref 1.005–1.030)
Urobilinogen, UA: 0.2 mg/dL (ref 0.0–1.0)

## 2010-07-08 LAB — OVA AND PARASITE EXAMINATION

## 2010-07-08 LAB — STOOL CULTURE

## 2010-07-08 LAB — HEMOGLOBIN A1C: Hgb A1c MFr Bld: 6.9 % — ABNORMAL HIGH (ref <5.7)

## 2010-07-08 LAB — CLOSTRIDIUM DIFFICILE EIA

## 2010-07-08 LAB — URINE CULTURE

## 2010-07-10 ENCOUNTER — Ambulatory Visit: Payer: Self-pay | Admitting: Internal Medicine

## 2010-07-24 ENCOUNTER — Other Ambulatory Visit (INDEPENDENT_AMBULATORY_CARE_PROVIDER_SITE_OTHER): Payer: Medicare Other

## 2010-07-24 ENCOUNTER — Ambulatory Visit (INDEPENDENT_AMBULATORY_CARE_PROVIDER_SITE_OTHER): Payer: Medicare Other | Admitting: Internal Medicine

## 2010-07-24 ENCOUNTER — Encounter: Payer: Self-pay | Admitting: Internal Medicine

## 2010-07-24 DIAGNOSIS — M549 Dorsalgia, unspecified: Secondary | ICD-10-CM

## 2010-07-24 DIAGNOSIS — E785 Hyperlipidemia, unspecified: Secondary | ICD-10-CM

## 2010-07-24 DIAGNOSIS — E119 Type 2 diabetes mellitus without complications: Secondary | ICD-10-CM

## 2010-07-24 MED ORDER — TRAMADOL HCL 50 MG PO TABS: 50.0000 mg | ORAL_TABLET | Freq: Three times a day (TID) | ORAL | Status: DC | PRN

## 2010-07-24 NOTE — Patient Instructions (Signed)
Will get lab today to check for rheumatoid arthritis. Your exam is pretty good with no evidence of a pinched nerve. Will try tramadol 50mg  three times a day to help with the pain. You may continue to take tylenol but no more than 3000mg  a day. At this time I do not see that a rheumatologist is needed yet.  Continue all your other medications.  We will check lab today for sugar, cholesterol and liver functions.  You will get a letter with the results.

## 2010-07-24 NOTE — Progress Notes (Signed)
Subjective:    Patient ID: [name], female    DOB: 1940-05-03, 70 y.o.   MRN: [id number]  HPI Ms. Ruppel presents for evaluation of chronic back pain. Chart reviewed: she has had MRI lumbar spine in '08 but no reading is available - was to be done by [facility] and there is no correspondence. 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 The patient reports that her back pain is constant but getting worse. She denies any weakness, paresthesia, loss of bowel or bladder control. She does report leg pain with walking. She has had no PT. She has tried celebrex with no relief. She takes APAP 1000mg  qid with some relief  Past Medical History  Diagnosis Date  . Anemia, iron deficiency   . Anxiety   . CAD (coronary artery disease)   . Type II or unspecified type diabetes mellitus without mention of complication, not stated as uncontrolled   . GERD (gastroesophageal reflux disease)   . HTN (hypertension)   . Peripheral neuropathy   . Schizophrenia   . Hyperlipidemia   . Allergic rhinitis   . 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 Dementia   . IBS (irritable bowel syndrome)   . Syncope     EP study 1996 w/atrial tachycardia  . Esophageal motility disorder   . Renal insufficiency    Past Surgical History  Procedure Date  . Abdominal hysterectomy   . Ptca 1995  . Knee arthroplasty     Right   . Breast biopsy     Negative   Family History  Problem Relation Age of Onset  . Schizophrenia Neg Hx   . Coronary artery disease Neg Hx   . Colon cancer Neg Hx   . Breast cancer Neg Hx   . Hypertension Other   . Diabetes Other    History   Social History  . Marital Status: Single    Spouse Name: N/A    Number of Children: N/A  . Years of Education: N/A   Occupational History  . Not on file.   Social History Main Topics  . Smoking status: Not on file  . Smokeless tobacco: Not on file    . Alcohol Use: Not on file  . Drug Use: Not on file  . Sexually Active: Not on file   Other Topics Concern  . Not on file   Social History Narrative   11th grade education  Never married  5 children: 4 daughters, 1 son, 7 grandchildren  Lives alone  Has supportive family        Review of Systems  Constitutional: Negative.   HENT: Negative.   Eyes: Negative.   Respiratory: Negative.   Cardiovascular: Negative.   Gastrointestinal: Negative.   Musculoskeletal: Positive for back pain and gait problem.  Skin: Negative.   Neurological: Negative.        Objective:   Physical Exam  Constitutional: Vital signs are normal. She is active and cooperative.  Non-toxic appearance. No distress.  Eyes: Conjunctivae and EOM are normal.  Neck: Neck supple.  Cardiovascular: Normal rate and regular rhythm.   Pulmonary/Chest: Breath sounds normal.  Musculoskeletal:       Back exam: nl stand, flex to 180 degrees, able to toe/heel walk, step up to exam without assistance, nl SLR sitting, DTR diminished right patellar tendon, nl on left, nl sensation to light touch and pin-prick, deep  vibratory sensation. No CVAT tenderness.  All small and medium joints normal without erythema, synovial thickening or deformity.  Neurological: She is alert.  Skin: Skin is warm and dry.  Psychiatric: She has a normal mood and affect. Her behavior is normal. Thought content normal.          Assessment & Plan:  1. Back pain - non-focal exam with no radicular findings. Suspect her pain is related to DJD. She does c/o sciatica type pain with radiation down her leg but her exam does not support marked nerve compression syndrome findings.  Plan - lab: RF           Tramadol 50mg  tid for pain ( aware of codeine intolerance)           If pain persists will arrange for HH-PT  2. Diabetes - due for routine follow - up lab  3. Hyperlipidemia - will order routine follow-up lab.

## 2010-07-25 LAB — HEPATIC FUNCTION PANEL
Alkaline Phosphatase: 69 U/L (ref 39–117)
Bilirubin, Direct: 0.1 mg/dL (ref 0.0–0.3)
Total Bilirubin: 0.4 mg/dL (ref 0.3–1.2)
Total Protein: 7.3 g/dL (ref 6.0–8.3)

## 2010-07-25 LAB — LIPID PANEL
Cholesterol: 189 mg/dL (ref 0–200)
LDL Cholesterol: 48 mg/dL (ref 0–99)
VLDL: 19 mg/dL (ref 0.0–40.0)

## 2010-07-26 ENCOUNTER — Encounter: Payer: Self-pay | Admitting: Internal Medicine

## 2010-07-26 LAB — COMPREHENSIVE METABOLIC PANEL
AST: 16 U/L (ref 0–37)
Albumin: 2.7 g/dL — ABNORMAL LOW (ref 3.5–5.2)
Alkaline Phosphatase: 87 U/L (ref 39–117)
BUN: 14 mg/dL (ref 6–23)
CO2: 20 mEq/L (ref 19–32)
Chloride: 109 mEq/L (ref 96–112)
Creatinine, Ser: 4.61 mg/dL — ABNORMAL HIGH (ref 0.4–1.2)
GFR calc non Af Amer: 9 mL/min — ABNORMAL LOW (ref >60)
Potassium: 4.1 mEq/L (ref 3.5–5.1)
Total Bilirubin: 0.3 mg/dL (ref 0.3–1.2)

## 2010-07-26 LAB — BASIC METABOLIC PANEL
BUN: 10 mg/dL (ref 6–23)
BUN: 12 mg/dL (ref 6–23)
BUN: 8 mg/dL (ref 6–23)
CO2: 22 mEq/L (ref 19–32)
CO2: 22 mEq/L (ref 19–32)
CO2: 23 mEq/L (ref 19–32)
CO2: 23 mEq/L (ref 19–32)
CO2: 25 mEq/L (ref 19–32)
Calcium: 7.9 mg/dL — ABNORMAL LOW (ref 8.4–10.5)
Calcium: 8 mg/dL — ABNORMAL LOW (ref 8.4–10.5)
Calcium: 8.1 mg/dL — ABNORMAL LOW (ref 8.4–10.5)
Calcium: 8.3 mg/dL — ABNORMAL LOW (ref 8.4–10.5)
Chloride: 106 mEq/L (ref 96–112)
Chloride: 110 mEq/L (ref 96–112)
Chloride: 111 mEq/L (ref 96–112)
Chloride: 111 mEq/L (ref 96–112)
Chloride: 114 mEq/L — ABNORMAL HIGH (ref 96–112)
Creatinine, Ser: 1.06 mg/dL (ref 0.4–1.2)
Creatinine, Ser: 1.2 mg/dL (ref 0.4–1.2)
Creatinine, Ser: 1.67 mg/dL — ABNORMAL HIGH (ref 0.4–1.2)
GFR calc Af Amer: 15 mL/min — ABNORMAL LOW (ref >60)
GFR calc Af Amer: 25 mL/min — ABNORMAL LOW (ref >60)
GFR calc Af Amer: 37 mL/min — ABNORMAL LOW (ref >60)
GFR calc Af Amer: 54 mL/min — ABNORMAL LOW (ref >60)
GFR calc Af Amer: >60 mL/min (ref >60)
GFR calc non Af Amer: 31 mL/min — ABNORMAL LOW (ref >60)
GFR calc non Af Amer: 45 mL/min — ABNORMAL LOW (ref >60)
GFR calc non Af Amer: 52 mL/min — ABNORMAL LOW (ref >60)
Glucose, Bld: 146 mg/dL — ABNORMAL HIGH (ref 70–99)
Glucose, Bld: 73 mg/dL (ref 70–99)
Glucose, Bld: 87 mg/dL (ref 70–99)
Glucose, Bld: 95 mg/dL (ref 70–99)
Potassium: 3 mEq/L — ABNORMAL LOW (ref 3.5–5.1)
Potassium: 3.1 mEq/L — ABNORMAL LOW (ref 3.5–5.1)
Potassium: 3.3 mEq/L — ABNORMAL LOW (ref 3.5–5.1)
Potassium: 3.8 mEq/L (ref 3.5–5.1)
Sodium: 138 mEq/L (ref 135–145)
Sodium: 142 mEq/L (ref 135–145)
Sodium: 142 mEq/L (ref 135–145)
Sodium: 143 mEq/L (ref 135–145)
Sodium: 145 mEq/L (ref 135–145)

## 2010-07-26 LAB — UIFE/LIGHT CHAINS/TP QN, 24-HR UR
Albumin, U: DETECTED
Alpha 1, Urine: DETECTED — AB
Alpha 2, Urine: DETECTED — AB
Total Protein, Urine: 153.5 mg/dL

## 2010-07-26 LAB — URINE MICROSCOPIC-ADD ON

## 2010-07-26 LAB — GLUCOSE, CAPILLARY
Glucose-Capillary: 100 mg/dL — ABNORMAL HIGH (ref 70–99)
Glucose-Capillary: 106 mg/dL — ABNORMAL HIGH (ref 70–99)
Glucose-Capillary: 108 mg/dL — ABNORMAL HIGH (ref 70–99)
Glucose-Capillary: 110 mg/dL — ABNORMAL HIGH (ref 70–99)
Glucose-Capillary: 112 mg/dL — ABNORMAL HIGH (ref 70–99)
Glucose-Capillary: 117 mg/dL — ABNORMAL HIGH (ref 70–99)
Glucose-Capillary: 124 mg/dL — ABNORMAL HIGH (ref 70–99)
Glucose-Capillary: 129 mg/dL — ABNORMAL HIGH (ref 70–99)
Glucose-Capillary: 153 mg/dL — ABNORMAL HIGH (ref 70–99)
Glucose-Capillary: 56 mg/dL — ABNORMAL LOW (ref 70–99)
Glucose-Capillary: 75 mg/dL (ref 70–99)
Glucose-Capillary: 78 mg/dL (ref 70–99)
Glucose-Capillary: 78 mg/dL (ref 70–99)
Glucose-Capillary: 85 mg/dL (ref 70–99)
Glucose-Capillary: 95 mg/dL (ref 70–99)
Glucose-Capillary: 96 mg/dL (ref 70–99)
Glucose-Capillary: 96 mg/dL (ref 70–99)
Glucose-Capillary: 98 mg/dL (ref 70–99)

## 2010-07-26 LAB — CBC
HCT: 28.9 % — ABNORMAL LOW (ref 36.0–46.0)
HCT: 30.4 % — ABNORMAL LOW (ref 36.0–46.0)
Hemoglobin: 10 g/dL — ABNORMAL LOW (ref 12.0–15.0)
Hemoglobin: 10.1 g/dL — ABNORMAL LOW (ref 12.0–15.0)
MCHC: 33.3 g/dL (ref 30.0–36.0)
MCHC: 33.7 g/dL (ref 30.0–36.0)
MCV: 94.3 fL (ref 78.0–100.0)
MCV: 94.8 fL (ref 78.0–100.0)
Platelets: 266 10*3/uL (ref 150–400)
RBC: 3.15 MIL/uL — ABNORMAL LOW (ref 3.87–5.11)
RBC: 3.21 MIL/uL — ABNORMAL LOW (ref 3.87–5.11)
RDW: 14.2 % (ref 11.5–15.5)
WBC: 14.5 10*3/uL — ABNORMAL HIGH (ref 4.0–10.5)

## 2010-07-26 LAB — IRON AND TIBC
Iron: 24 ug/dL — ABNORMAL LOW (ref 42–135)
Saturation Ratios: 13 % — ABNORMAL LOW (ref 20–55)
TIBC: 189 ug/dL — ABNORMAL LOW (ref 250–470)

## 2010-07-26 LAB — RENAL FUNCTION PANEL
Albumin: 2.4 g/dL — ABNORMAL LOW (ref 3.5–5.2)
Albumin: 2.5 g/dL — ABNORMAL LOW (ref 3.5–5.2)
Calcium: 7.8 mg/dL — ABNORMAL LOW (ref 8.4–10.5)
Calcium: 8 mg/dL — ABNORMAL LOW (ref 8.4–10.5)
Creatinine, Ser: 1.67 mg/dL — ABNORMAL HIGH (ref 0.4–1.2)
Creatinine, Ser: 2.86 mg/dL — ABNORMAL HIGH (ref 0.4–1.2)
GFR calc Af Amer: 20 mL/min — ABNORMAL LOW (ref >60)
GFR calc Af Amer: 37 mL/min — ABNORMAL LOW (ref >60)
GFR calc non Af Amer: 16 mL/min — ABNORMAL LOW (ref >60)
GFR calc non Af Amer: 31 mL/min — ABNORMAL LOW (ref >60)
Phosphorus: 3.6 mg/dL (ref 2.3–4.6)
Sodium: 142 mEq/L (ref 135–145)

## 2010-07-26 LAB — DIFFERENTIAL
Basophils Absolute: 0 10*3/uL (ref 0.0–0.1)
Basophils Absolute: 0 10*3/uL (ref 0.0–0.1)
Basophils Relative: 0 % (ref 0–1)
Basophils Relative: 1 % (ref 0–1)
Eosinophils Absolute: 0.1 10*3/uL (ref 0.0–0.7)
Eosinophils Relative: 0 % (ref 0–5)
Eosinophils Relative: 1 % (ref 0–5)
Eosinophils Relative: 1 % (ref 0–5)
Lymphocytes Relative: 15 % (ref 12–46)
Monocytes Absolute: 0.7 10*3/uL (ref 0.1–1.0)
Monocytes Absolute: 0.9 10*3/uL (ref 0.1–1.0)
Monocytes Relative: 6 % (ref 3–12)
Neutro Abs: 11.6 10*3/uL — ABNORMAL HIGH (ref 1.7–7.7)
Neutro Abs: 9.9 10*3/uL — ABNORMAL HIGH (ref 1.7–7.7)

## 2010-07-26 LAB — FERRITIN: Ferritin: 277 ng/mL (ref 10–291)

## 2010-07-26 LAB — URINALYSIS, ROUTINE W REFLEX MICROSCOPIC
Bilirubin Urine: NEGATIVE
Nitrite: NEGATIVE
Nitrite: NEGATIVE
Protein, ur: 100 mg/dL — AB
Specific Gravity, Urine: 1.008 (ref 1.005–1.030)
Specific Gravity, Urine: 1.013 (ref 1.005–1.030)
Urobilinogen, UA: 0.2 mg/dL (ref 0.0–1.0)
Urobilinogen, UA: 0.2 mg/dL (ref 0.0–1.0)
pH: 6 (ref 5.0–8.0)

## 2010-07-26 LAB — PROTEIN ELECTROPH W RFLX QUANT IMMUNOGLOBULINS
Albumin ELP: 40.2 % — ABNORMAL LOW (ref 55.8–66.1)
Alpha-1-Globulin: 10.4 % — ABNORMAL HIGH (ref 2.9–4.9)
Alpha-2-Globulin: 20 % — ABNORMAL HIGH (ref 7.1–11.8)
Gamma Globulin: 19.7 % — ABNORMAL HIGH (ref 11.1–18.8)

## 2010-07-26 LAB — CK: Total CK: 21 U/L (ref 7–177)

## 2010-07-26 LAB — IGG, IGA, IGM: IgA: 131 mg/dL (ref 68–378)

## 2010-07-26 LAB — IMMUNOFIXATION ADD-ON

## 2010-07-27 LAB — GLUCOSE, CAPILLARY
Glucose-Capillary: 117 mg/dL — ABNORMAL HIGH (ref 70–99)
Glucose-Capillary: 120 mg/dL — ABNORMAL HIGH (ref 70–99)
Glucose-Capillary: 132 mg/dL — ABNORMAL HIGH (ref 70–99)
Glucose-Capillary: 145 mg/dL — ABNORMAL HIGH (ref 70–99)
Glucose-Capillary: 148 mg/dL — ABNORMAL HIGH (ref 70–99)
Glucose-Capillary: 156 mg/dL — ABNORMAL HIGH (ref 70–99)
Glucose-Capillary: 183 mg/dL — ABNORMAL HIGH (ref 70–99)
Glucose-Capillary: 205 mg/dL — ABNORMAL HIGH (ref 70–99)
Glucose-Capillary: 245 mg/dL — ABNORMAL HIGH (ref 70–99)
Glucose-Capillary: 367 mg/dL — ABNORMAL HIGH (ref 70–99)
Glucose-Capillary: >600 mg/dL (ref 70–99)

## 2010-07-27 LAB — LIPASE, BLOOD: Lipase: 20 U/L (ref 11–59)

## 2010-07-27 LAB — DIFFERENTIAL
Basophils Absolute: 0 10*3/uL (ref 0.0–0.1)
Basophils Relative: 0 % (ref 0–1)
Eosinophils Relative: 0 % (ref 0–5)
Lymphocytes Relative: 3 % — ABNORMAL LOW (ref 12–46)
Lymphs Abs: 1.1 10*3/uL (ref 0.7–4.0)
Monocytes Absolute: 0.6 10*3/uL (ref 0.1–1.0)
Monocytes Absolute: 1.1 10*3/uL — ABNORMAL HIGH (ref 0.1–1.0)
Monocytes Relative: 3 % (ref 3–12)
Monocytes Relative: 6 % (ref 3–12)
Neutro Abs: 16.8 10*3/uL — ABNORMAL HIGH (ref 1.7–7.7)
Neutrophils Relative %: 94 % — ABNORMAL HIGH (ref 43–77)

## 2010-07-27 LAB — BASIC METABOLIC PANEL
BUN: 11 mg/dL (ref 6–23)
BUN: 17 mg/dL (ref 6–23)
CO2: 22 mEq/L (ref 19–32)
CO2: 23 mEq/L (ref 19–32)
CO2: 24 mEq/L (ref 19–32)
Calcium: 9.5 mg/dL (ref 8.4–10.5)
Chloride: 110 mEq/L (ref 96–112)
Chloride: 114 mEq/L — ABNORMAL HIGH (ref 96–112)
Chloride: 118 mEq/L — ABNORMAL HIGH (ref 96–112)
Creatinine, Ser: 0.84 mg/dL (ref 0.4–1.2)
Creatinine, Ser: 0.89 mg/dL (ref 0.4–1.2)
Creatinine, Ser: 1.68 mg/dL — ABNORMAL HIGH (ref 0.4–1.2)
GFR calc Af Amer: 37 mL/min — ABNORMAL LOW (ref >60)
GFR calc Af Amer: >60 mL/min (ref >60)
GFR calc Af Amer: >60 mL/min (ref >60)
GFR calc Af Amer: >60 mL/min (ref >60)
GFR calc non Af Amer: 30 mL/min — ABNORMAL LOW (ref >60)
GFR calc non Af Amer: >60 mL/min (ref >60)
Glucose, Bld: 171 mg/dL — ABNORMAL HIGH (ref 70–99)
Glucose, Bld: 178 mg/dL — ABNORMAL HIGH (ref 70–99)
Potassium: 3.2 mEq/L — ABNORMAL LOW (ref 3.5–5.1)
Potassium: 4 mEq/L (ref 3.5–5.1)
Sodium: 134 mEq/L — ABNORMAL LOW (ref 135–145)
Sodium: 142 mEq/L (ref 135–145)
Sodium: 143 mEq/L (ref 135–145)

## 2010-07-27 LAB — URINALYSIS, ROUTINE W REFLEX MICROSCOPIC
Glucose, UA: >1000 mg/dL — AB
Nitrite: NEGATIVE
Protein, ur: 30 mg/dL — AB
pH: 6.5 (ref 5.0–8.0)

## 2010-07-27 LAB — CBC
HCT: 30.2 % — ABNORMAL LOW (ref 36.0–46.0)
HCT: 32.4 % — ABNORMAL LOW (ref 36.0–46.0)
HCT: 32.4 % — ABNORMAL LOW (ref 36.0–46.0)
Hemoglobin: 10.9 g/dL — ABNORMAL LOW (ref 12.0–15.0)
Hemoglobin: 11.9 g/dL — ABNORMAL LOW (ref 12.0–15.0)
MCHC: 32.5 g/dL (ref 30.0–36.0)
MCHC: 33.2 g/dL (ref 30.0–36.0)
MCHC: 33.6 g/dL (ref 30.0–36.0)
MCHC: 33.8 g/dL (ref 30.0–36.0)
MCV: 94.5 fL (ref 78.0–100.0)
MCV: 94.8 fL (ref 78.0–100.0)
MCV: 95 fL (ref 78.0–100.0)
Platelets: 105 10*3/uL — ABNORMAL LOW (ref 150–400)
Platelets: 125 10*3/uL — ABNORMAL LOW (ref 150–400)
Platelets: 68 10*3/uL — ABNORMAL LOW (ref 150–400)
RBC: 3.41 MIL/uL — ABNORMAL LOW (ref 3.87–5.11)
RBC: 3.6 MIL/uL — ABNORMAL LOW (ref 3.87–5.11)
RBC: 3.82 MIL/uL — ABNORMAL LOW (ref 3.87–5.11)
RDW: 14.4 % (ref 11.5–15.5)
WBC: 19.2 10*3/uL — ABNORMAL HIGH (ref 4.0–10.5)
WBC: 19.7 10*3/uL — ABNORMAL HIGH (ref 4.0–10.5)
WBC: 22 10*3/uL — ABNORMAL HIGH (ref 4.0–10.5)

## 2010-07-27 LAB — COMPREHENSIVE METABOLIC PANEL
Albumin: 1.9 g/dL — ABNORMAL LOW (ref 3.5–5.2)
BUN: 25 mg/dL — ABNORMAL HIGH (ref 6–23)
Calcium: 8.7 mg/dL (ref 8.4–10.5)
Chloride: 106 mEq/L (ref 96–112)
Creatinine, Ser: 1.41 mg/dL — ABNORMAL HIGH (ref 0.4–1.2)
GFR calc Af Amer: 45 mL/min — ABNORMAL LOW (ref >60)
GFR calc non Af Amer: 37 mL/min — ABNORMAL LOW (ref >60)
Total Bilirubin: 0.8 mg/dL (ref 0.3–1.2)

## 2010-07-27 LAB — URINE CULTURE: Colony Count: >=100000

## 2010-07-27 LAB — URINE MICROSCOPIC-ADD ON

## 2010-07-27 LAB — HEMOGLOBIN A1C
Hgb A1c MFr Bld: 9.6 % — ABNORMAL HIGH (ref 4.6–6.1)
Mean Plasma Glucose: 229 mg/dL

## 2010-08-13 ENCOUNTER — Telehealth: Payer: Self-pay

## 2010-08-13 NOTE — Telephone Encounter (Signed)
Patient called [facility] stating that she was seen a few weeks ago and told to call back if tramadol was not working. She would like MD to know that tramadol is not working and see what else he thinks she can try

## 2010-09-04 ENCOUNTER — Telehealth: Payer: Self-pay | Admitting: *Deleted

## 2010-09-04 ENCOUNTER — Other Ambulatory Visit: Payer: Self-pay | Admitting: Internal Medicine

## 2010-09-04 MED ORDER — FUROSEMIDE 20 MG PO TABS: 20.0000 mg | ORAL_TABLET | Freq: Every evening | ORAL | Status: DC

## 2010-09-04 NOTE — Discharge Summary (Signed)
NAMELAYKEN, BEG [name]:  [id number]   MEDICAL RECORD [name]:  [id number]          PATIENT TYPE:  IPS   LOCATION:  0402                          FACILITY:  [facility]   PHYSICIAN:  Anselm Jungling, MD  DATE OF BIRTH:  1940/07/17   DATE OF ADMISSION:  12/29/2006  DATE OF DISCHARGE:  01/05/2007                               DISCHARGE SUMMARY   IDENTIFYING DATA/REASON FOR ADMISSION:  This was an inpatient  psychiatric admission, the first at [facility] for Jane [name], a 70 year old  divorced African-American female who was a patient of Dr. Donell Beers.  He  referred her for treatment of increasing and persistent auditory  hallucinations of three years duration.  The patient had apparently been  on numerous antipsychotics in the past.  Dr. Donell Beers had recently been  increasing Abilify in stepwise doses, and Depakote had been recently  added to her regimen as well.  The patient also came to [place] taking  Aricept, presumably for early onset of mild dementia.  Please refer to  the admission note for further details pertaining to the symptoms,  circumstances and history that led to her hospitalization.   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   MEDICAL/LABORATORY:  The patient was medically and physically assessed  by the psychiatric nurse practitioner.  She came to [place] with a history of  insulin-dependent diabetes mellitus, as was followed closely by the  pharmacist and the nurse practitioner throughout her stay.  She was also  continued on the usual glyburide, Norvasc for hypertension, Zocor, and  Claritin 10 mg daily.  There were [name] acute medical issues.   HOSPITAL COURSE:  The patient was admitted to the adult inpatient  service.  She presented as a well-nourished, well-developed woman who  was alert, fully oriented, and pleasant.  At times, her affect was giddy  and odd.  However, she made [name] delusional statements.  Her reality  testing was intact.  Her general level of cognition appeared to be good,  although she did appear to show evidence of short-term memory  difficulties.  In the initial interview, she denied auditory  hallucinations at that particular moment, but she complained of auditory  hallucinations quite clearly over the next 2-3 days of her hospital  stay.   We learned from collaterals sources that the patient had initially had  very good symptomatic relief of auditory hallucinations with the onset  of Depakote therapy.  However, following this, her hallucinations  returned.  It was felt that possibly, with an increase in the Depakote  dosage resulting in a high therapeutic level of Depakote, that the  hallucinations might be more successfully suppressed.  Depakote was  increased to 1000 mg q.h.s., and Abilify, which had initially been 30 mg  daily, was increased stepwise to an ultimate level of 40 mg at bed.  With this regimen, the patient indicated that she was sleeping very  well, and not having any more auditory hallucinations.   She remained generally pleasant and relaxed throughout  her inpatient  stay and was a good participant in therapeutic groups and activities.  She expressed gratitude for the help that she had received.  We explored  the possibility of family involvement, but we were not able to identify  any family for a family support session.   On the eighth hospital day, the patient appeared to be quite stable, was  in a good mood, and had consistently been absent auditory hallucinations  for 2-3 days.  She was tolerating medication well.  She and casemanager  had been able to identify a day program that she could attend, and she  was looking forward to this greatly.  She agreed to the following  aftercare plan.   AFTERCARE:  The patient was to follow up with Dr. Donell Beers and [name], with an appointment on  January 12, 2007.   DISCHARGE MEDICATIONS:  1. Glyburide 5 mg b.i.d.  2. Norvasc 10 mg daily.  3. Aricept 10 mg daily.  4. Zocor 40 mg q.h.s.  5. Klonopin 1 mg q.h.s.  6. Depakote ER 1000 mg q.h.s.  7. Claritin 10 mg daily.  8. Abilify 40 mg q.h.s.  9. Insulin regimen, as directed by her physician.   DISCHARGE DIAGNOSES:  AXIS I:  Schizoaffective disorder, most recently  psychotic, resolving.  Dementia not otherwise specified.  AXIS II:  Deferred.  AXIS III:  History of hypertension, insulin-dependent diabetes mellitus,  seasonal allergies.  AXIS IV:  Stressors:  Severe.  AXIS V:  GAF on discharge 50.      Anselm Jungling, MD  Electronically Signed     SPB/MEDQ  D:  01/08/2007  T:  01/08/2007  Job:  [id number]

## 2010-09-04 NOTE — Discharge Summary (Signed)
[name], [name] NO.:  [id number]   MEDICAL RECORD NO.:  [id number]          PATIENT TYPE:  INP   LOCATION:  4705                         FACILITY:  [facility]   PHYSICIAN:  Gordy Savers, MDDATE OF BIRTH:  Feb 14, 1941   DATE OF ADMISSION:  09/12/2006  DATE OF DISCHARGE:                               DISCHARGE SUMMARY   CHIEF COMPLAINT:  Chest pain.   FINAL DIAGNOSES:  1. Noncardiac chest pain.  2. Coronary artery disease.  3. Gastroesophageal reflux disease.  4. Hypertension.  5. Dyslipidemia.  6. Diabetes.   DISCHARGE MEDICATIONS:  1. Aspirin 325 mg daily.  2. Prevacid 30 mg daily.  3. Nitroglycerin 1/150 sublingually p.r.n. chest pain.  4. Metoprolol 25 mg twice daily.  5. Altace 10 mg daily.  6. Aricept 5 mg daily.  7. Klonopin 1 mg at bedtime.  8. Haldol 5 mg in the morning, 10 mg at bedtime.  9. Lasix 40 mg every morning.  10.Norvasc 10 mg daily.  11.Zocor 40 mg daily.  12.Insulin 70/30 15 units twice daily before meals.  13.Mylanta 2 tablespoons as needed p.r.n. indigestion.   HISTORY OF PRESENT ILLNESS:  The patient is a 70 year old African-  American female with a history of coronary disease, diabetes,  hypertension and dyslipidemia.  She presented to the hospital with left-  sided chest pain of 5 days' duration.  This was associated with  shortness of breath, some nausea and vomiting.  At times, there seem to  be a pleuritic component.  The patient was admitted for further  evaluation and treatment of her atypical chest pain.   LABORATORY DATA AND HOSPITAL COURSE:  The patient was observed in  telemetry setting.  Serial cardiac markers were obtained as well as a D-  dimer.  These were all negative.  The patient received a GI cocktail for  an episode of chest pain with resolution here in the hospital.  The  patient was seen by cardiology who felt this represented nonischemic  pain.  The patient was maintained on her multiple  preadmission  medications.  Blood sugars were monitored, and the patient was covered  sliding scale regimen of short-acting insulin.  Electrocardiogram  revealed normal sinus rhythm and nonspecific mild ST-T wave  abnormalities.   DISPOSITION:  The patient was discharged today on the medical regimen  listed above.  She has been asked to return to the care of her primary  care physician next week for evaluation.  She will be considered for a  Cardiolite stress test as an outpatient.   CONDITION ON DISCHARGE:  Stable.      Gordy Savers, MD  Electronically Signed     PFK/MEDQ  D:  09/13/2006  T:  09/13/2006  Job:  [id number]

## 2010-09-04 NOTE — Consult Note (Signed)
NAMEBRALYNN, VELADOR NO.:  [id number]   MEDICAL RECORD NO.:  [id number]          PATIENT TYPE:  INP   LOCATION:  1843                         FACILITY:  [facility]   PHYSICIAN:  Gerrit Friends. Dietrich Pates, MD, FACCDATE OF BIRTH:  [birth year]   DATE OF CONSULTATION:  09/12/2006  DATE OF DISCHARGE:                                 CONSULTATION   CARDIOLOGY CONSULTATION NOTE   CHIEF COMPLAINT:  Left-sided chest pain.   HISTORY OF PRESENT ILLNESS:  Ms. Ledlow is a 70 year old African-  American woman with a history of coronary artery disease status post  left anterior descending artery stent in 1995, diabetes mellitus, and  hypertension among other issues.  She presents with a greater than two  month history of left-sided chest pain.  Per her report, the pain had  been occurring approximately 1 hour postprandially for greater than two  months.  It typically resolved on its own without any intervention or  treatment.  However, over the past one week, the pain has become  constant; it is located just below the left breast and through the left  lateral chest wall.  It is described as a aching pain.  Typically  exertion does not worsen this pain, but the patient does become short of  breath she says with walking or exerting herself which is a new finding.  She cannot remember, clearly, the day she was admitted in the early  1990s prior to her stenting procedure; but she does report that this  current pain feels different in intensity and character.  The pain is  not been associated with any nausea, diaphoresis, palpitations or racing  of the heart.  She is kind of equivocal in terms of if it is worse with  inspiration.  She says that she is able to palpate the area on her chest  wall which reproduces and somewhat exacerbates the pain.  The patient in  her own words reports that this is consistent with her reflux.  Thus  far in the ED she has received nitroglycerin as well as  morphine,  neither of which have relieved the pain.   PAST MEDICAL HISTORY:  1. Coronary artery disease      a.     Stent to the LAD in 1995      b.     Last cardiac catheterization in August 2005 by Dr. Riley Kill       revealed a patent LAD stent, moderate disease of the mid       circumflex with non-flow-limiting lesion, a preserved EF of 55-       60%, and mild mitral regurgitation.  2. Dementia.  3. Diabetes mellitus type 2.  4. Hypertension.  5. Hyperlipidemia.  6. Paranoid schizophrenia.  7. Gastroesophageal reflux disease.  8. Peripheral neuropathy.   MEDICATIONS:  Dictation ended at this point.      Antony Contras, M.D.  Electronically Signed      Gerrit Friends. Dietrich Pates, MD, [facility]  Electronically Signed    GL/MEDQ  D:  09/12/2006  T:  09/12/2006  Job:  [id number]   cc:  Pricilla Riffle, MD, [facility]  Rosalyn Gess. Norins, MD

## 2010-09-04 NOTE — Assessment & Plan Note (Signed)
[facility]                                 ON-CALL NOTE   [name], [name]                       MRN:          [id number]  DATE:01/11/2007                            DOB:          1941-04-03    Nurse is Coy Saunas.  Phone number:  [phone].  Unsure of primary care  doctor.  Unsure date of birth.   SUBJECTIVE:  Coy Saunas called stating that [name] has had 24 hours of  back pain and dysuria.  She had a urinary tract infection 3-4 weeks ago.  She is concerned it has come back.  The home health nurse is unsure if  she is homebound or whether she can get to an urgent care.  She denies  fever, chills, nausea, vomiting.   ASSESSMENT/PLAN:  If [name] is not home bound, she will go to an  urgent care to be evaluated by a physician today.  If she is home bound,  the home health nurse is going to obtain a urinalysis and bring it to a  lab today and call me with the results.  Patient of Dr. Debby Bud.     Kerby Nora, MD  Electronically Signed    AB/MedQ  DD: 01/11/2007  DT: 01/11/2007  Job #: [id number]

## 2010-09-04 NOTE — Assessment & Plan Note (Signed)
[facility]ALTHC[facility]                                 ON-CALL NOTE   NAME:[name], [name]                       MRN:          [id number]  DATE:10/23/2007                            DOB:          1941-03-19    TIME RECEIVED:  08:34 a.m.   CALLER:  Kaysia Willard.   She sees Dr. Debby Bud.   Telephone [phone].   The patient has a very vague and confusing story to tell me.  She  basically relates that she has had constant low back pain for the past 2  months.  She has been to the emergency room several times and nobody  knows what is wrong.  She has never contacted Dr. Debby Bud about this,  nor has she seen him in the office in quite some time.  She is asking me  to diagnose and treat her problem side and seen over the telephone.  We  discussed with her that this was impossible of course.  We offered to  see her in our Weekend [facility] this morning, which she refused.  I told  her that the best place to start is to contact Dr. Debby Bud who is her  regular doctor early next week and set up an office visit to discuss it.  In the meantime, she can use Tylenol for discomfort.  Apparently, she  has no other symptoms except for dull constant low back pain.     Tera Mater. Clent Ridges, MD  Electronically Signed    SAF/MedQ  DD: 10/23/2007  DT: 10/24/2007  Job #: [id number]

## 2010-09-04 NOTE — Telephone Encounter (Signed)
refills  

## 2010-09-04 NOTE — Consult Note (Signed)
[name], ROLLER NO.:  [id number]   MEDICAL RECORD NO.:  [id number]          PATIENT TYPE:  INP   LOCATION:  1843                         FACILITY:  [facility]   PHYSICIAN:  Jane Friends. Dietrich Pates, MD, FACCDATE OF BIRTH:  09-Aug-1940   DATE OF CONSULTATION:  09/12/2006  DATE OF DISCHARGE:                                 CONSULTATION   ADDENDUM:  Continuation of consult dictation.   MEDICATIONS:  1. Altace 10 mg p.o. daily.  2. Aspirin 325 mg p.o. daily.  3. Doxepin does unknown.  4. Loratadine.  5. Prevacid 30 mg p.o. daily.  6. Aricept 5 mg p.o. daily.  7. Crestor.  8. Geodon.  9. Haldol 5 mg.  10.Lasix 40 mg daily.  11.Glyburide.  12.Prandin.  13.Norvasc.  14.Simvastatin.  15.Klonopin.  16.Invega.   SOCIAL HISTORY:  [name] lives in [place] alone.  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  She is a prior smoker having  approximately a 34-pack-year history; however she was able to  successfully quit more than a decade ago.  She denies any drug or  alcohol use.   FAMILY HISTORY:  Notable for her mother who passed away from an MI at  the age of 73.  She lost contact with for father and is unaware of his  health status.  She denies any overt history of any other known  illnesses that typically run in her family.   PHYSICAL EXAMINATION:  VITAL SIGNS:  Temperature 97.2, heart rate of  104, blood pressure 185/91, respiratory rate of 18.  O2 saturations were  94% on room air.  GENERAL:  In general she is a normal appearing woman in no apparent  distress.  HEENT:  Eyes -- pupils equally round and react to light.  Extraocular  muscles are intact.  CARDIOVASCULAR:  She has a regular rate and rhythm.  No murmurs, rubs,  or gallops.  No JVD.  PULMONARY EXAM:  Reveals lungs are clear to auscultation bilaterally  there are no wheezes, rhonchi, or rales.  ABDOMEN:  Soft, nontender, nondistended.  EXTREMITIES:  Reveal no cyanosis, clubbing,  or edema.  She has 2+ pulses  in the bilateral lower extremities.  NEUROLOGIC:  She is alert and oriented.  She is no focal deficits and  strength or sensation.  She does have a slight resting tremor in the  bilateral upper extremities.   LABS:  White count 8.7, hemoglobin 12.9, platelets 307.  Sodium 138,  potassium 3.8, chloride 110, bicarb 24, BUN 13, creatinine 0.7, glucose  of 147.  Point of care cardiac markers showed a CK-MB of less than 1,  troponin less than 0.05.  Myoglobin of 64.5.   CHEST X-RAY:  Reveals no pneumonia.  Mild chronic bronchitic changes.   EKG:  Reveals a normal sinus rhythm and a rate of 90, some left axis  deviation, no T-or-ST changes, and no Q-waves were noted.  There are no  signs of acute ischemia.   ASSESSMENT AND PLAN:  [name] is a 70 year old African-American woman  with a history of coronary artery disease  and prior stents to the left  anterior descending artery in 1990.  She had nonobstructive disease on  her repeat catheterization in August 2005 who presents with:  1. Chest pain, left-sided.  This pain would be very atypical for any      form of myocardial ischemia.  At this point the pain has been present for a month.  It is mostly  postprandial; however, given her significant past medical history of  coronary artery disease and prior stent; I think, that it is appropriate  for the patient to be admitted for observation to rule out myocardial  infarction.  We will check another EKG and the morning.  If her enzymes  remained normal, and her EKG remains unchanged, then it is probably  appropriate for the patient to be discharged with an outpatient follow  up with Dr. Tenny Craw, as well as a stress nuclear study.  In the interim,  recommendation was made to continue aspirin, statin, beta blocker, and  an ACE inhibitor.  We will check a fasting lipid profile in the morning.  At this point, the patient was quite stable clinically, and is   comfortable.  We will continue to follow along with a primary internal  medicine service.      [name], M.D.  Electronically Signed      Jane Friends. Dietrich Pates, MD, [facility]  Electronically Signed    GL/MEDQ  D:  09/12/2006  T:  09/12/2006  Job:  [id number]   cc:   Jane Gess. Norins, MD  Jane Riffle, MD, [facility]

## 2010-09-04 NOTE — Assessment & Plan Note (Signed)
[facility]                                 ON-CALL NOTE   NAME:HOLSEYCybil, [name]                       MRN:          [id number]  DATE:09/13/2006                            DOB:          04-11-1941    PHONE NUMBER:  [phone]   Patient of Dr. Debby Bud.  She called at 12:19 p.m. on Sep 13, 2006.  She  then called again at 12:55 because I had not gotten back to her, seeing  patients here at the clinic.  She asked me to call in a GI cocktail  because her reflux has gotten worse.  She had been on Prevacid for 12  years and feels like it is not helping enough.  She was in the hospital  recently, but I do not think it was related to that.   PLAN:  I told her that a GI cocktail is not for regular use and I would  not phone it in for her.  I suggested that she can try the Prevacid  twice a day or try Prilosec over-the-counter for change of medicine  twice a day, and she is due to see Dr. Debby Bud this week.     Karie Schwalbe, MD  Electronically Signed    RIL/MedQ  DD: 09/13/2006  DT: 09/13/2006  Job #: [id number]   cc:   Rosalyn Gess. Norins, MD

## 2010-09-07 NOTE — Discharge Summary (Signed)
[name], [name] NO.:  [id number]   MEDICAL RECORD NO.:  [id number]                   PATIENT TYPE:  INP   LOCATION:  3004                                 FACILITY:  [facility]   PHYSICIAN:  Rene Paci, M.D. [facility]          DATE OF BIRTH:  Dec 20, 1940   DATE OF ADMISSION:  04/01/2003  DATE OF DISCHARGE:  04/04/2003                                 DISCHARGE SUMMARY   DISCHARGE DIAGNOSES:  1. Hypoxic respiratory failure secondary to pulmonary edema.  2. Bradycardia.  3. Ventilator-dependent respiratory failure.  4. Polypharmacy.  5. Labile hypertension.  6. Nausea and vomiting.   HISTORY OF PRESENT ILLNESS:  [name] is a 70 year old African-American  female who presented to the emergency department via EMS secondary to  increased work of breathing.  She was found by EMS to have copious  secretions and a systolic blood pressure of 110 and heart rate 25-40.  In  the field, her O2 saturation was 87% on room air.  The patient was intubated  for airway protection.   PAST MEDICAL HISTORY:  1. Schizophrenia.  2. Adult onset diabetes mellitus.  3. Gastroesophageal reflux disease.  4. Hiatal hernia.  5. Coronary artery disease, status post PTCA stent in 1996.  6. Irritable bowel syndrome.  7. COPD.   HOSPITAL COURSE:  Problem 1. Pulmonary.  The patient was admitted with  respiratory failure with hypoxemia secondary to pulmonary edema precipitated  by bradycardia.  The patient was admitted to rule out cardiac ischemia.  Cardiac enzymes were negative.  There was also no evidence of diastolic  dysfunction, her 2-D echo was essentially normal.  It was felt that her  bradycardia was secondary to redundant beta-blockers.   Problem 2. Polypharmacy.  The patient had a basket of 24 medications, many  multiples of the same drug.  These medications were taken from the patient  and have been reviewed.  The patient's medications have been reduced and  redundant medications have been discontinued.   Problem 3. Infectious disease.  The patient had a mild increase in  temperature, she also had nausea and vomiting.  A urinalysis was obtained  and was negative.  She was empirically started on Ceftin to complete for  seven days.  No definite source of infection was identified.   DISCHARGE MEDICATIONS:  1. Altace 10 mg daily.  2. Toprol XL 100 mg daily.  3. Prevacid 30 mg daily.  4. Glyburide 5 mg b.i.d.  5. Metformin 500 mg b.i.d.  6. _________ 325 mg daily.  7. Trazodone 100 mg q.p.m.  8. Abilify 10 mg q.p.m.  9. Tums p.r.n.  10.      Vitamin E.  11.      Skelaxin as needed.  12.      Ceftin 250 mg b.i.d. for five more days.   At this time, she has been advised not to take her clonidine, Haldol,  Elavil, Doxepin,  Loxapine, Celexa, or Atarax.   FOLLOW UP:  The patient has an appointment to see Dr. Debby Bud on Wednesday,  April 06, 2003 at 10:30 a.m.  She needs followup with her doctor at  mental health as well.      [name], P.A. [facility]                  Rene Paci, M.D. [facility]    LC/MEDQ  D:  04/04/2003  T:  04/04/2003  Job:  [id number]   cc:   Rosalyn Gess. Norins, M.D. [facility]   Pricilla Riffle, M.D.   Dr. Marney Setting  [facility]

## 2010-09-07 NOTE — Op Note (Signed)
[name], [name]                          ACCOUNT NO.:  [id number]   MEDICAL RECORD NO.:  [id number]                   PATIENT TYPE:  INP   LOCATION:  2550                                 FACILITY:  [facility]   PHYSICIAN:  Rodney A. Chaney Malling, M.D.           DATE OF BIRTH:  08/05/1940   DATE OF PROCEDURE:  10/19/2002  DATE OF DISCHARGE:                                 OPERATIVE REPORT   PREOPERATIVE DIAGNOSIS:  Severe osteoarthritis of the right knee.   POSTOPERATIVE DIAGNOSIS:  Severe osteoarthritis of the right knee.   OPERATION PERFORMED:  Total knee replacement, right using a standard right  cemented patella with a standard right cemented femoral component and a  keeled tibial tray size 3 cemented with a 12.5 mm poly insert.   SURGEON:  Lenard Galloway. Chaney Malling, M.D.   ASSISTANT:  Jamelle Rushing, P.A.   ANESTHESIA:  General.   DESCRIPTION OF PROCEDURE:  The patient was placed on the operating table in  the supine position with a pneumatic tourniquet about the right upper thigh.  The right lower extremity was prepped with DuraPrep and draped out  atraumatically.  The leg was wrapped out with an Esmarch and the tourniquet  was elevated.  A straight incision was started above the patella and carried  down to the tibial tubercle.  Skin edges were retracted.  Bleeders were  coagulated.  A long median parapatellar incision was made using the cutting  cautery.  The patella was everted.  The patellar fat pad was then removed.  The knee was flexed 90 degrees and both medial and lateral meniscus were  excised.  Tibial guide #1 was placed over the anterior aspect of the tibia,  placed at appropriate cutting level, fixed with fixation pins and the  proximal tibia was amputated with a power saw.  Excellent flush cut was  achieved.  Attention was then turned to the distal femur.  A notch was made  over the anterior aspect of the femur and a rasp was used to smooth this  down.  The femoral  guide #1 was placed over the anterior aspect of the femur  and held in position and a drill hole made.  An intramedullary rod was  inserted.  A C-clamp was inserted and a 12.5 mm spacer was applied.  This  tightened up both medial and lateral ligaments to be balanced extremely  well.  The fixation pin was then used to fix the femoral guide #1.  Using a  CapSure guide, anterior and posterior cuts were made over the femur.  With  the knee flexed, spacer block was inserted once again and again there was  excellent balancing of the collateral ligaments.  Femoral guide #2 was  placed over the cut surface of the anterior aspect of the femur and held in  position with an intramedullary rod and then fixed with fixation pins.  The  distal end  of the femur was then amputated.  With the knee extended, a  spacer block was put in position and there was excellent balance to the  collateral ligaments in both flexion and extension.  Femoral guide #3 was  placed over the distal end of the femur, locked into position.  Chamfer cuts  were made and drill holes were made.   Attention was then turned to the tibia.  This was subluxed forward with a  McHale retractor.  A size 3 trial tibial tray was put in position and locked  in position with fixation pins and tower was applied.  A drill hole was  made.  The tower was removed.  The wing impactor was then inserted and the  locking wings were put in position.  A 12.5 mm trial was then snap fit into  place and the femoral component was placed over the distal end of the femur  and fit very nicely.  The knee was then articulated and there was excellent  range of motion with full extension and full flexion with excellent  balancing of the collateral ligaments.  Attention was then turned to the  patella.  The patella was everted.  The patella cutting block was applied  and the posterior aspect of the patella was removed.  Three holes were  drilled on the posterior  aspect of the patella using the femoral guide and  then the trial patella was inserted.  The knee was articulated and put  through a full range of motion.  There was excellent stability, no tendency  of the poly to pop.  Repeat drawer was negative.  A slight lateral release  was then done.  This allowed the patella to retract more centrally.  All the  components were removed.  All debris was removed using the pulsating lavage  and the knee was irrigated with copious amounts of antibiotic solution.   Glue was mixed and the components were then inserted sequentially starting  with the tibia, then the femur and then the patella.  This was all  articulated with trial tibial spacer.  Excess glue was removed in the  standard fashion.  Once the glue had cured and hardened, the knee was flexed  and all excess glue was removed using a small osteotome.  The tibial spacer  was removed and tourniquet dropped.  All bleeders were coagulated.  The  wound was relatively dry.  Again, the knee was irrigated with pulsating  lavage and antibiotic solution.  The final tibial component was inserted and  snap fit into place.  The knee was put through a full range of motion.  We  had excellent tracking of the patella and a good range of motion of the knee  was noted.  A Hemovac drain was inserted.  The median parapatellar incision  was closed with interrupted heavy Ethibond sutures.  Vicryl was used to  close the subcutaneous tissue.  Stainless steel staples were used to close  the skin.  Sterile dressings were applied.  The patient was then transferred  to the recovery room in excellent condition.  Technically, this procedure  went extremely well.   DRAINS:  Hemovac.   COMPLICATIONS:  None.                                               Rodney A. Chaney Malling, M.D.  RAM/MEDQ  D:  10/19/2002  T:  10/19/2002  Job:  [id number]

## 2010-09-07 NOTE — Discharge Summary (Signed)
[name], [name]                          ACCOUNT NO.:  [id number]   MEDICAL RECORD NO.:  [id number]                   PATIENT TYPE:  INP   LOCATION:  4735                                 FACILITY:  [facility]   PHYSICIAN:  Thomas C. Wall, M.D.                DATE OF BIRTH:  June 05, 1940   DATE OF ADMISSION:  12/08/2003  DATE OF DISCHARGE:  12/10/2003                           DISCHARGE SUMMARY - REFERRING   SUMMARY OF HISTORY:  [name] is a 70 year old African-American female who  presented with a two-week history of constant substernal chest pressure  waxing and waning from a scale of 3-7 on a scale of 0-10.  She described it  as being somewhat worse in the preceding 12 hours and multiple associated  symptoms which included ankle swelling, cervical pain, shortness of breath,  diaphoresis, and near syncope.  She also describes a second discomfort as  random, sharp discomfort lasting seconds.   Her history is notable for COPD with history of respiratory failure,  hypertension, diabetes, GERD, hiatal hernia, osteoarthritis, IBS,  [facility], negative Cardiolite in  October 2003, and angioplasty to the proximal LAD in 1996 with a known EF of  55% and remote tobacco use.   LABORATORY DATA:  Admission H&H was 12.2 and 36.1, normal indices, platelets  325,000, WBC 8.0, PT 12.1, PTT 27, sodium 142, potassium 3.0, BUN 7,  creatinine 0.8, normal LFTs, hemoglobin A1c was elevated at 6.6, TSH was  normal at 0.574, CK total MB and BMP were all within normal limits times  one.  X-rays showed low lung volumes, no acute findings.  EKG showed normal  sinus rhythm, left axis deviation.   HOSPITAL COURSE:  [name] was admitted to 28.  She was placed on her  home medications as well as IV heparin.  Overnight remarkably she did not  have any further chest discomfort.  Catheterization on December 09, 2003 by  Dr. Riley Kill revealed a mid 20-30 RCA, proximal  40-50% circ, 30% proximal LAD  at the prior stent site, 30-40% mid LAD, EF of 55-60%, with possible 1-2+MR.  Dr. Riley Kill felt that her coronary artery disease was noncritical and her  chest discomfort was not related to cardiac issues.  He did check a D-dimer  which was within normal limits and her potassium was replaced.  By the  morning of December 10, 2003, the patient was ambulating without difficulty,  catheterization site was intact, potassium was corrected to 3.7 with  supplementation.  Dr. Daleen Squibb reviewed and felt that the patient could be  discharged home.   DISCHARGE DIAGNOSES:  Prolonged atypical chest discomfort with  nonobstructive coronary artery disease by catheterization.  History as  previously.  Hypertension, hypokalemia and hyperglycemia.   DISPOSITION:  She was asked to continue her home medications which include:  1. Prevacid 30 mg daily.  2. Altace 10 mg daily.  3. Norvasc  10 mg daily.  4. Amitriptyline 50 mg q.h.s.  5. Glyburide 5 mg b.i.d.  6. Loxapine 10 mg q.a.m. 30 mg q.h.s.  7. Aspirin 325 mg daily.  8. Etodolac 500 mg b.i.d.  9. Skelaxin 800 mg 1/2 tablet t.i.d.  10.      Darvocet as needed.  11.      Nitroglycerin as needed.   She was advised no lifting, driving, sexual activity or heavy exertion for  two days.  Maintain low salt-fat-cholesterol ADA diet.  If she has any  problems with her catheterization site she was asked to call.  She did  receive a new prescription for Toprol XL 25 mg.  She was asked to arrange a  1-2 week followup appointment with Dr. Debby Bud especially with regards to her  diabetic control.  She was also asked to arrange followup appointment with  Dr. Tenny Craw in the next 2-3 months for a cardiac followup.  At the time of  followup with Dr. Tenny Craw, consideration should be given to reviewing her  cardiac risk factors especially lipid status and possible statin therapy, as  well as hypertension control.      [name], P.A. [facility]                     Thomas C. Wall, M.D.    EW/MEDQ  D:  12/10/2003  T:  12/10/2003  Job:  [id number]   cc:   Rosalyn Gess. Norins, M.D. [facility], MD

## 2010-09-07 NOTE — Assessment & Plan Note (Signed)
[facility]                                 ON-CALL NOTE   NAME:[name], [name]                       MRN:          [id number]  DATE:04/26/2006                            DOB:          1940-08-19    Called from [phone].  Patient of Dr. Debby Bud.  She called at 4:25 p.m.  on April 26, 2006 saying she had a question about the diabetes, and  just wondered what she could drink to get her blood sugar up.  She feels  fine, but she cannot have a lot of fruit juices because she breaks out  in a rash, and was wondering if she could drink sweet tea.  I explained  to her that it would be fine for her to have some sweet tea if she  needed it to get her blood sugar up.  She should follow up with Dr.  Debby Bud if she has any further problems.     Lelon Perla, DO  Electronically Signed    Shawnie Dapper  DD: 04/26/2006  DT: 04/26/2006  Job #: [id number]   cc:   Rosalyn Gess. Norins, MD

## 2010-09-07 NOTE — Letter (Signed)
January 28, 2006     [facility]  [address]  [place], [place] [address]   Recipient ID #[id number]   RE:  [name], [name]  MRN:  [id number]  /  DOB:  1940/07/29   To Whom It May Concern,   I am writing this as a letter of request for exception for coverage for [name], for a NovoLog 70/30 Flex Pen System.  [name] is an insulin-  dependent diabetic.  She has significant visual impairment and is unable to  actually dry up her own insulin syringes accurately and safely.  She has  been checked in the office for the ability to use the NovoLog 70/30 Flex Pen  System, which she can do reliably and safely.   I am requesting coverage for this system as a medical necessity, so this  patient can be adequately and safely treated for insulin-dependent diabetes,  without risk of under-dosing or over-dosing her insulin.   Your consideration in this matter is greatly appreciated.  Your response to  this request can be mailed to me, Rosalyn Gess. Norins, M.D., F.A.C.P., [facility], [address] [place], [place], [place] [address].   Thank you very much for your consideration in this matter.    Sincerely,     ______________________________  Rosalyn Gess. Norins, MD    MEN/MedQ  /  Job #:  [id number]  DD:  01/28/2006 / DT:  01/28/2006

## 2010-09-07 NOTE — H&P (Signed)
[name], [name]                          ACCOUNT NO.:  [id number]   MEDICAL RECORD NO.:  [id number]                   [name] TYPE:  INP   LOCATION:  2025                                 FACILITY:  [facility]   PHYSICIAN:  Rosalyn Gess. Norins, M.D. [facility]         DATE OF BIRTH:  1940-09-11   DATE OF ADMISSION:  12/11/2002  DATE OF DISCHARGE:                                HISTORY & PHYSICAL   CHIEF COMPLAINT:  Can't breathe.   HISTORY OF PRESENT ILLNESS:  [name] is a 70 year old African-American  female, recently having undergone right total knee replacement who reports  shortness of breath and dyspnea on exertion for approximately 1 month  period. This a.m., she reports increased shortness of breath.  EMS  transported the [name] to [facility] where on arrival, O2  saturation was 78% on room air. EMS does report that at the time they  arrived, she was cool, clammy, diaphoretic and short of breath.  The  [name]'s O2 saturation improved with oxygen via nasal cannula.   Initial evaluation in the ER revealed [name] to be afebrile.  Her blood  pressure was initially elevated at 191/101, dropping to 150/73 with  treatment.  CT scan of the chest was negative for PE, and did reveal small  pleural effusions.  She is now admitted to rule out MI and for pulmonary  evaluation.   PAST MEDICAL HISTORY:  Status post total knee replacement as noted.   PAST MEDICAL HISTORY:  History of schizophrenia, well managed medically. Non-  insulin-dependent diabetes. Hypertension.  Reflux. Hiatal hernia.  Coronary  artery disease with PTCA and stenting in 1996. Irritable bowel syndrome.   CURRENT MEDICATIONS:  1. Glyburide 5 mg daily.  2. Metformin 500 mg b.i.d.  3. Toprol 50 mg b.i.d.  4. Haldol 15 mg q.h.s.  5. Elavil 15 mg daily.  6. Darvocet N 100, q.6h. p.r.n.  7. Prevacid 30 mg daily.   FAMILY HISTORY:  Noncontributory.   SOCIAL HISTORY:  [name] lives alone. She does have  several daughters who  are very supportive.  She has a 60 packs year smoking history but quit  smoking in '99.   DRUG ALLERGIES:  CODEINE.   PHYSICAL EXAMINATION:  VITAL SIGNS:  Admission temperature 97, blood  pressure 150/73, pulse 80, respirations 20, O2 saturation 97% on 2 L.  GENERAL:  This is an overweight black female in no acute distress.  HEENT:  Normocephalic, atraumatic.  Conjunctivae and sclerae clear.  NECK:  Supple.  BACK: Without CVA tenderness.  LUNGS:  [name] is moving air well, there is no wheezing. She does have  coarse rhonchi. There is no increased work of breathing. There is no  retraction of the neck muscles.  CARDIOVASCULAR:  With 2+ radial pulses.  No JVD, no carotid bruits.  Precordium is quiet. Regular rate and rhythm without murmurs.  ABDOMEN:  Soft, no guarding, no rebound, no organomegaly  or splenomegaly.  RECTAL:  Deferred.  EXTREMITIES:  Without clubbing, cyanosis or edema. She does have a well-  healed surgical scar at the right knee. Right knee is very warm to the  touch, there is no palpable effusion.  The knee cap is not ballottable.  NEUROLOGIC:  [name] is awake, oriented to person, place, time and context.  Cranial nerves II-XII grossly intact.  Exam is nonfocal.   DATA BASE:  Hemoglobin 11.6 g, white count 13,800 with 76% segs, 18% lymphs,  5% monos, platelet count 305,000.  Brain natriuretic peptide was 239. D-  dimer was 0.89. Sodium 141, potassium 4.4, chloride 110, CO2 22, BUN 22,  glucose 115.   CT scan of the chest was negative for PE, positive for small effusions.   ASSESSMENT/PLAN:  1. Cardiovascular.  The [name] is a diabetic with known coronary artery     disease, status post stenting in the past.  We now have reports of     progressive dyspnea on exertion.  She had an acute exacerbation this a.m.     and was cool, diaphoretic. She had no chest pain.  EKG in the emergency     department was without acute changes. She does have a  mildly elevated     BNP.  I am concerned for silent ischemia, left ventricular dysfunction     and question of subendocardial myocardial infarction.  PLAN:  Telemetry admission, will check cardiac enzymes x3. The [name] is a  candidate for 2-D echo to assess for wall motion abnormality and ejection  fraction.  Will check lipids as well, with cardiac enzymes as noted.  If her  cardiac enzymes are negative, she would be a candidate for a stress  Cardiolite. Will continue  beta-blockers, will add aspirin, will add an ACE inhibitor.  At this point,  will not add heparin unless she develops chest pain, discomfort or EKG  changes.  1. Pulmonary.  [name] with significant hypoxemia, I suspect cardiac origin.     CT of the chest was unremarkable.  Once [name] has been ruled out for     cardiac disease, she would be a candidate for PTF's.  2. Diabetes.  [name] has been on oral medications at home.  PLAN:  Will continue Metformin and Glyburide. Will check an A1c and adjust  her medications as indicated.  1. Orthopedic/Infectious disease.  [name] is status post right total knee     replacement. The incision looks good.  The [name] does have ongoing pain     to the knee, is hot to the touch but no effusion. She has a mild     leukocytosis.  PLAN:  Empiric coverage with Cipro 500 mg b.i.d. and will notify orthopedics  for consultation.  1. Psychiatric.  [name] with stable schizophrenia.  PLAN:  Will continue her home medications.                                                Rosalyn Gess Norins, M.D. [facility]    MEN/MEDQ  D:  12/11/2002  T:  12/12/2002  Job:  [id number]   cc:   Pricilla Riffle, M.D.   Rodney A. Chaney Malling, M.D.  [address] [place]  [place] [address]  Fax: [phone]

## 2010-09-07 NOTE — Cardiovascular Report (Signed)
[name], [name]                          ACCOUNT NO.:  [id number]   MEDICAL RECORD NO.:  [id number]                   PATIENT TYPE:  INP   LOCATION:  4735                                 FACILITY:  [facility]   PHYSICIAN:  Arturo Morton. Riley Kill, M.D. [facility]         DATE OF BIRTH:  1940-06-14   DATE OF PROCEDURE:  12/09/2003  DATE OF DISCHARGE:                              CARDIAC CATHETERIZATION   INDICATIONS FOR PROCEDURE:  [name] is a 70 year old lady who has had  prior percutaneous stenting of the left anterior descending artery by Dr.  Alanda [name] in 1995.  She had repeat catheterization's by Dr. Corinda [name] and  myself in 1996 and 1997.  She has done fine, but presented with recurrent  substernal chest pain.  Cardiac markers were negative for non-Q wave  infarction.  However, the patient had symptoms similar to the time when she  had her angioplasty, and she is at high risk for recurrent coronary events  due to diabetes mellitus.  Because of this, repeat cardiac catheterization  was indicated.   PROCEDURE:  1. Left heart catheterization.  2. Selective coronary arteriography.  3. Selective left ventriculography.   DESCRIPTION OF PROCEDURE:  The patient was brought to the catheterization  laboratory and prepped and draped in the usual fashion.  An ISTAT potassium  was done and it was 3.4, and she was given oral K-Dur 40 mEq.  She was given  Zofran at her request.  Through an anterior puncture, the right femoral  artery was easily entered.  Blood pressures were really quite high.  Because  of this, we gave her three doses of intravenous Lopressor.  Five milligrams  were administered on each time and the heart rate came down from 106 down to  approximately 87.  Blood pressure remained at 160/about 100.  We  subsequently gave her Labetalol 20 mg at the completion of the procedure to  bring down blood pressure more for groin holding.  Overall, she tolerated  the procedure well.  There were  no complications.  Coronary nitroglycerin  was administered to better assess the left anterior descending artery.   HEMODYNAMIC DATA:  1. Central aortic pressure 191/107, mean 144.  2. Left ventricle 176/90.  3. No gradient on pull-back across the aortic valve.   ANGIOGRAPHIC DATA:  1. Ventriculography was performed in the RAO projection.  Overall systolic     function was preserved.  Ejection fraction was calculated at 55 to 60%.     There was some mitral regurgitation that would be graded at 1 to 2+, but     it was somewhat difficult to tell whether this was in diastole or     systole.  A followup 2-D echocardiogram would be warranted.  2. The left main was free of critical disease.  3. The left anterior descending coursed to the apex.  At the previous site     of stenting, there is a  widely patent stent with about 30% diffuse in-     stent narrowing.  This does not result in what appears to be a     hemodynamically significant lesion.  In the remainder of the left     anterior descending, there is some luminal irregularity, especially in     the distal vessel, but there does not appear to be any high-grade     disease.  There are two major diagonal branches.  The distal left     anterior descending wraps the apex.  4. There is a small ramus vessel which is free of critical disease.  5. The circumflex is fairly large providing a large bifurcating marginal     branch distally.  In the mid portion of the vessel there is about a 40 to     50% area of eccentric plaquing that appears to be otherwise fairly     smooth.  It is doubtful that this accounts for a significant flow     reduction, especially when viewed in the LAO projection.  6. The right coronary artery is a large caliber vessel providing the     posterior descending and posterior lateral systems, 20 to 30% narrowing     is noted in this vessel.   CONSULTATIONS:  1. Well preserved left ventricular function with perhaps mild  mitral     regurgitation.  2. Stenting of the left anterior descending artery previously with continued     patency.  3. Moderate disease of the mid circumflex with what appears to be non-flow     limiting lesion.   RECOMMENDATIONS:  1. Optimization of hemoglobin A1C.  2. Continued aspirin therapy.  3. Consider proton pump inhibitor.  4. Better blood pressure control.  5. Continue followup with Dr. Debby [name] and in the cardiology clinic.  6. Assess echocardiography to assess mitral valve.  7. Check D-dimer.                                               Arturo Morton. Riley Kill, M.D. [facility]    TDS/MEDQ  D:  12/09/2003  T:  12/10/2003  Job:  [id number]   cc:   CV Lab   [name] [name], M.D. [facility]   [name], M.D.

## 2010-09-07 NOTE — Discharge Summary (Signed)
[name], [name]              ACCOUNT NO.:  [id number]   MEDICAL RECORD NO.:  [id number]          PATIENT TYPE:  INP   LOCATION:  6706                         FACILITY:  [facility]   PHYSICIAN:  Rosalyn Gess. Norins, MD  DATE OF BIRTH:  [birth year]   DATE OF ADMISSION:  01/10/2006  DATE OF DISCHARGE:  01/16/2006                                 DISCHARGE SUMMARY   ADMITTING DIAGNOSIS:  1. Uncontrolled diabetes with nonketotic hyperosmolar syndrome.  2. Hypokalemia.  3. CAD stable.  4. Hyperlipidemia stable.  5. Decreased memory.  6. Hypertension.  7. Hyponatremia.  8. Schizophrenia stable.   DISCHARGE DIAGNOSES:  1. Diabetes, stable on b.i.d. insulin.  2. Hypokalemia resolved.  3. __________ are stable.   HISTORY OF PRESENT ILLNESS:  The patient was seen in the Emergency  Department on the day of admission because of a very high blood sugar and  decreased memory.  She was found to have a blood sugar that was greater than  700.  Please see the H&P for past medical history, family history as well as  the examination.   HOSPITAL COURSE:  1. Diabetes.  The patient was initially treated with IV insulin      Glucometer.  She was started on insulin coverage.  The patient has not      been able to take her oral medications.  There was some delay because      of the patient's Medicaid would not cover preloaded insulin pins or      Lantus pins and therefore efforts were made to switch her over to      management at home.  The patient has great difficulty with vision as      well understanding the principals of drawing up insulin.  However, she      was by the day of discharge able to administer her own shot.      Arrangements have been made that her family will be able to preload      syringes for her and she will be on a fixed dose of mixed insulin      70/30.  The patient's final CBC on the day of discharge was 97.  The      patient had been able to administer insulin as noted.  2.  Cardiovascular: Patient had been admitted initially to a telemetry      unit.  She was stable from a cardiac perspective.  3. Hypertension, stable.  4. GI sinusitis.  The patient did have sinusitis diagnosis at time of      admission.  She was started on Rocephin.  She has completed 5 days of      therapy and will be discharged home on p.o. Augmentin.   DISCHARGE MEDICATIONS:  1. Norvasc 10 mg p.o. q.d.  2. Altace 10 mg daily.  3. Aspirin 325 mg daily.  4. Prevacid 30 mg daily.  5. LDL 50 mg to 100 mg q.h.s. p.r.n.  6. Aricept 5 mg daily.  7. Crestor 10 mg daily.  8. Geodon 40 mg one in the morning, two at night.  9. Allopurinol 10 mg b.i.d.  10.Lasix 40 mg daily.  11.Humulin 70/30 20 units b.i.d.   DISCHARGE EXAMINATION:  The patient afebrile at 99.1, blood pressure 134/77,  heart rate 115, respirations 18.  General Appearance:  Well-nourished, well-  developed woman in no acute distress.  Chest was clear.  Cardiovascular exam  was unremarkable.  No further examination conducted.   DISPOSITION:  The patient is discharged home.  She has been instructed in  administration of her insulin.  Prescriptions were provided.  The patient  will be seen in the office by Dr. Debby Bud in 1 week for follow-up.  She is  instructed to check CBG's before breakfast and then a second time of day  either before supper or before bedtime and to keep a record.  The patient's condition at time of discharge dictation is stable and  improved.           ______________________________  Rosalyn Gess Norins, MD     MEN/MEDQ  D:  01/16/2006  T:  01/18/2006  Job:  [id number]

## 2010-09-07 NOTE — Discharge Summary (Signed)
NAMETHANA, RAMP NO.:  [id number]   MEDICAL RECORD NO.:  [id number]                   PATIENT TYPE:  IPS   LOCATION:  4142                                 FACILITY:  [facility]   PHYSICIAN:  Ellwood Dense, M.D.                DATE OF BIRTH:  1940-05-29   DATE OF ADMISSION:  10/25/2002  DATE OF DISCHARGE:  11/01/2002                                 DISCHARGE SUMMARY   DISCHARGE DIAGNOSES:  1. 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  2. Anemia.  3. Postoperative pneumonia, resolved.  4. Type 2 diabetes mellitus.  5. Hypertension.  6. Gastroesophageal reflux disease.  7. Irritable bowel syndrome.  8. Schizophrenia.   HISTORY OF PRESENT ILLNESS:  A 70 year old female, admitted October 19, 2002  with end-stage degenerative joint disease of the right knee and no relief  with conservative care.  Underwent a right total knee arthroplasty, October 19, 2002, per Dr. Chaney Malling.  Placed on subcutaneous Lovenox for deep venous  thrombosis prophylaxis and weightbearing as tolerated.  Postoperative fever  101.6.  WBCs 22,000.  Urinalysis negative.  Chest x-ray, October 21, 2002 with  increased basilar linear atelectasis, early pneumonia, right lower lobe;  placed on Avalox October 22, 2002.  Doppler study, lower extremities, October 22, 2002 was negative.  Internal Medicine follow up for hypertension, diabetes  mellitus.  Lantus insulin was initiated.  Minimal assist for bed mobility.  Latest chemistries unremarkable.  Admitted for a comprehensive rehab  program.   PAST MEDICAL HISTORY:  See discharge diagnoses.   ALLERGIES:  CODEINE.   SOCIAL HISTORY:  Remote smoker.  No alcohol.   PRIMARY PHYSICIAN:  Dr. Debby Bud.   MEDICATIONS PRIOR TO ADMISSION:  1. Prevacid.  2. Norvasc.  3. Glyburide.  4. Elavil.  5. Lodine.  6. Haldol.  7. Vitamin E.   SOCIAL HISTORY:  Lives alone in [place], independent prior to  admission.  One-level home, five steps to entry.  Local family.  Works.   HOSPITAL COURSE:  The patient did well while on rehabilitation services with  therapies initiated on a b.i.d. basis.  The following issues were followed  during patient's rehab course:  Pertaining to Mrs. Edmonds's right total knee  arthroplasty, surgical site healing nicely.  Staples have been removed.  No  signs of infection.  She was weightbearing as tolerated with knee  precautions.  She was advised to follow up with at the orthopedic service of  Dr. Chaney Malling.   Postoperative anemia stable on iron supplement.  Latest hemoglobin 10.4,  hematocrit 30.8.   Postoperative pneumonia, resolved.  Avalox had been completed.  Followup  chest x-ray improved with minimal bibasilar streaky densities, consistent  with resolving.   Blood pressures had some modest elevations.  Her Toprol XL was increased to  100 mg twice daily per internal medicine.  Diabetes mellitus with some variables.  She is now on Lantus insulin.  Full  diabetic teaching was completed.  Latest blood sugars improved, 183 and 154.  She was stressed the need for diabetic control.  She would continue on her  Lantus insulin, Glucophage and glyburide.   She had no bladder or bowel disturbances.  She continued on Prevacid for  history of irritable bowel syndrome.   Overall, for her function mobilities, she was now independent in her room,  needing very little assistance for lower body activities of daily living.  Family teaching was completed.  Home health therapies would be ongoing.   She was on subcutaneous Lovenox throughout her rehab stay for deep venous  thrombosis prophylaxis.  Venous Doppler studies negative.  Lovenox was  discontinued at time of discharge.   DISCHARGE MEDICATIONS:  1. Included Prevacid daily.  2. Trinsicon twice daily.  3. Norvasc 10 mg daily.  4. Elavil 50 mg at bedtime.  5. Haldol 15 mg at bedtime.  6. Lantus insulin 20  units at bedtime.  7. Toprol XL 100 mg twice daily.  8. Glucophage 500 mg twice daily.  9. Tylox 1 or 2 tablets every 4 hours as needed for pain.  10.      Glyburide 5 mg daily.   ACTIVITY:  Weightbearing as tolerated.   DIET:  Diabetic diet.   WOUND CARE:  Cleanse incision daily with soap and water.   FOLLOW UP:  1. She should follow up with Dr. Debby Bud, [phone], internal medicine.  2. Dr. Chaney Malling, orthopedic care.     Mariam Dollar, P.A.                     Ellwood Dense, M.D.    DA/MEDQ  D:  11/01/2002  T:  11/02/2002  Job:  [id number]   cc:   Ellwood Dense, M.D.  [address] [place]  [place] [address]  Fax: [phone]   Rosalyn Gess. Norins, M.D. [facility] A. Chaney Malling, M.D.  [address] [place]  [place] [address]  Fax: [phone]    cc:   Ellwood Dense, M.D.  [address] [place]  [place] [address]  Fax: [phone]   Rosalyn Gess. Norins, M.D. [facility] A. Chaney Malling, M.D.  [address] [place]  [place] [address]  Fax: [phone]

## 2010-09-07 NOTE — Discharge Summary (Signed)
NAMECAMERIN, JIMENEZ NO.:  [id number]   MEDICAL RECORD NO.:  [id number]          PATIENT TYPE:  INP   LOCATION:  5032                         FACILITY:  [facility]   PHYSICIAN:  Thereasa Distance A. Mortenson, M.D.DATE OF BIRTH:  Mar 09, 1941   DATE OF ADMISSION:  08/28/2004  DATE OF DISCHARGE:  09/03/2004                                 DISCHARGE SUMMARY   ADMITTING DIAGNOSES:  1.  End-stage osteoarthritis left knee.  2.  Paranoid schizophrenia.  3.  Diabetes.  4.  Hypertension.  5.  Hiatal hernia.  6.  Reflux.  7.  History of increased paranoia related to strong narcotics.   DISCHARGE DIAGNOSES:  1.  Status post left total knee arthroplasty.  2.  Acute blood loss anemia symptomatic requiring two units of packed red      blood cells.  3.  Tachycardia improved with atenolol.  4.  Leukocytosis unknown origin.  5.  Hypokalemia, treated.  6.  Paranoid schizophrenia.  7.  Diabetes.  8.  Hypertension.  9.  Hiatal hernia.  10. Reflux.  11. History of increased paranoia with strong narcotics.   HISTORY OF PRESENT ILLNESS:  Ms. Colson is a 70 year old black female with a  history of right total knee arthroplasty in 2004 with good results.  The  patient has been having continued severe pain in the left for many years.  The pain is worse with weightbearing activity and range of motion.  She  describes the pain in the left knee as an achy sensation with occasional  popping and grinding.  The pain does radiate down the leg.  No positive  waking pain.  She denies any previous injury.  X-rays reveal bone-on-bone  with medial compartment varus deformity via left knee.   ALLERGIES:  CODEINE, PERCOCET, ANY STRONG MEDICATIONS, increasingly  paranoid.   CURRENT MEDICATIONS:  1.  Haldol 10 mg p.o. b.i.d.  2.  Elavil 50 mg p.o. q.h.s.  3.  Glyburide 5 mg p.o. b.i.d.  4.  Altace 10 mg p.o. daily.  5.  Skelaxin 40 mg p.o. t.i.d.  6.  Norvasc 10 mg p.o. daily.  7.  ____________ 500  mg p.o. b.i.d.  8.  Prevacid 60 mg p.o. q.a.m.  9.  Vitamin E and multivitamins.  10. Bayer aspirin.   SURGICAL PROCEDURE:  The patient was taken to the operating room on Aug 28, 2004 by Dr. Rinaldo Ratel assisted by Arnoldo Morale, PA.  The patient was  placed under general anesthesia and a left total knee arthroplasty was  performed.  The following components were used:  Standard femoral component  with size 3 MBT tibial tray with a standard size poly insert 15.5 mm  thickness with a standard sized patella.  All components were cemented.  Knee arthroplasty was performed using computer navigation.  The patient  tolerated the procedure well and returned to recovery in good stable  condition.   CONSULTATIONS:  The following consults were obtained while patient was  hospitalized--PT, OT, case management, rehab, [facility].   HOSPITAL COURSE:  On postop day #1, the patient was afebrile, vital  signs  stable.  White count was 19,300.  The patient had a hemoglobin of 10.0 to  29.5, asymptomatic.  Progressive pulmonary toileting was instituted.   On postop day #2, patient afebrile, blood pressure 151/80, heart rate 116,  respiratory rate 18, oxygen saturation 93% on room air.  H&H was 8.4/24.6.  Patient feeling dizzy, had tachycardia, therefore, was transfused two units  of packed red blood cells with 20 mg of Lasix between units.  White blood  count trended down and was 18,200.  The patient remained afebrile and lungs  were clear.  Patient with poor controlled diabetes mellitus.  Sliding scale  changed to resistant.  Aggressive pulmonary toileting was again encouraged.   On postop day #3, the patient's pain reported as 8/10.  The patient,  however, appeared in no distress.  White count again elevated at 19,200.  The patient's T max was 100.8, was hypertensive with blood pressure of  183/73, heart rate 119.  EKG was ordered.  H&H improved to 10.5 and 30.5.  Chest x-ray was ordered,  blood cultures x3.  Consult to [facility].  EKG showed sinus tachycardia with no change from previous EKG.  D-dimer was  ordered.  BMP ordered.  Cardiac enzymes ordered.  If D-dimer positive, chest  CT was to be ordered.  The patient was placed on metoprolol 25 mg b.i.d.   On postop day #4, the patient's blood pressure improved.  Heart rate  improved status post Toprol being added.  The patient's H&H was 9.8 and  28.5.  The patient ambulated well around the room with no dizziness and  feeling much more rested.  White blood count trending down 17,500.  Blood  cultures pending.  Chest x-ray showed bilateral basilar atelectasis, mild  diffuse peribronchial thickening, aggressive pulmonary toileting again  instituted.  Elevated D-dimer at 0.64.  CT angiogram was ordered and  reported as mild atelectasis, but no PE.  Patient with mild hypokalemia and  potassium being replaced.  Cardiac enzymes were negative x3.   On postop day #5, patient with no complaints.  Pain 3/10.  Patient afebrile.  Blood pressure 162/87, pulse of 100, oxygen saturation 93%.  Range of motion  -10 to 60 degrees left knee.  White count still elevated at 17,500.  Patient's CBT noted to be higher in the evenings in the range from 135 to  223, it was felt with ambulation that CBT would improve.   On postop day #6, patient without complaints tolerating diet well, pain well  controlled, progressing well with physical therapy, denied chest pain,  shortness of breath, nausea, vomiting.  T max at 98.7, pulse 67, blood  pressure 142/68, O2 saturation 92% on room air, CBT on May 15 at 0030 hours  was 138.  CBC was checked prior to patient's discharge and showed patient's  white count to 13,700, hemoglobin 9.7, hematocrit 29.2, and platelets  430,000.  It was felt that the patient was progressing well and was ready  for discharge.   LABORATORIES:  Routine labs on admission--CBC on admission white blood count was 9,400,  hemoglobin 12.4, hematocrit 36.6, platelets 389,000.  Coags on  admission:  All values within normal limits.  D-dimer on Aug 31, 2004 was  0.64 and high.  Routine chemistries on admission:  Sodium 138, potassium  3.8, chloride 107, bicarb 27, glucose 101, BUN 12, creatinine 0.7.  Routine  hepatic enzymes on admission:  All studies within normal limits.  Cardiac  markers obtained on May 12  through Sep 01, 2004 negative x3.  Urinalysis on  Aug 23, 2004 is negative.  Urinalysis on Aug 29, 2004:  Glucose elevated at  500 mg/dL.  Blood cultures:  No growth as of Aug 31, 2004 and will be held  for a total of five days before issuing a final negative report.  Urine  cultures on Aug 29, 2004 showed no growth after one day.   EKG on Sep 01, 2004 showed normal sinus rhythm, heart rate of 90  beats/minute, PR interval 144 ms.  PRT axis 57/-12/57.   Portable chest x-ray on Aug 31, 2004 showed mild bibasilar atelectasis, mild  diffuse peribronchial thickening, no evidence of active infiltrate or  interval change.   CT angiogram of the chest performed on Aug 31, 2004 showed no definite PE to  the chest by CT criteria with under opacified upper lobe pulmonary  vasculature bilaterally.  No other acute findings in the chest.  Atherosclerotic vascular disease noted.   DISCHARGE MEDICATIONS:  1.  Had the patient to resume home medications except for aspirin while on      the Arixtra.  Add the following:  Arixtra 2.5 mg one injection subcu at      8 a.m. on May 16 and then on May 17, resume aspirin.  2.  Ambien 5 mg 1 q.h.s. p.r.n. insomnia.  3.  Skelaxin 400 mg one tab every six hours p.r.n. spasm.  4.  Darvocet N 100 one to two tablets every four to six hours for pain.  5.  Metoprolol 50 mg b.i.d.   ACTIVITY:  Patient is to be partial weightbearing 50% with walker, home  health PT with [place].   DIET:  [employer] modified.   WOUND CARE:  The patient is to keep wound clean and dry.  Change dressing   daily.  May shower in two days with no drainage.   The patient is to call office for temperatures greater than 101.5, develops  chills, foul-smelling drainage or pain uncontrolled.   SPECIAL INSTRUCTIONS:  CPM 0-90 degrees six to eight hours a day, increase  by 10 degrees daily.   FOLLOWUP:  1.  Patient needs to follow up with Dr. Chaney Malling in the office in      approximately eight days.  Patient to call office      at [phone] for an appointment.  2.  The patient needs to follow up with Dr. Debby Bud, her PCP, in the next 7-      10 days for blood pressure and diabetes mellitus.  Repeat BMET, CBC in      one week, results to Dr. Debby Bud.      Bronson Curb   GC/MEDQ  D:  11/30/2004  T:  12/01/2004  Job:  [id number]   cc:   Thereasa Distance A. Chaney Malling, M.D.  [address] [place]  [place] [address]  Fax: [phone]   Rosalyn Gess. Norins, M.D. [facility]

## 2010-09-07 NOTE — H&P (Signed)
[name], [name]              ACCOUNT NO.:  [id number]   MEDICAL RECORD NO.:  [id number]          PATIENT TYPE:  INP   LOCATION:  NA                           FACILITY:  [facility]   PHYSICIAN:  Rodney A. Mortenson, M.D.DATE OF BIRTH:  [birth year]   DATE OF ADMISSION:  08/28/2004  DATE OF DISCHARGE:                                HISTORY & PHYSICAL   CHIEF COMPLAINT:  Left knee pain.   HISTORY OF PRESENT ILLNESS:  The patient is a 70 year old black female with  a history of right total knee arthroplasty in 2004 with good results.  The  patient has been having continued severe pain in her left knee for many  years, it worsens with any weight-bearing and range of motion activities.  She describes it as a severe deep aching sensation with a bone popping and  grinding.  The pain does radiate down the leg.  She denies any night pain.  She denies any previous injury.  X-rays reveal near bone-on-bone medial  compartment with varus deformity.   ALLERGIES:  1.  CODEINE.  2.  PERCOCET.  3.  ANY STRONG MEDICATIONS INCREASING PARANOIA.   CURRENT MEDICATIONS:  1.  Haldol 10 mg p.o. b.i.d.  2.  Elavil 50 mg p.o. q.h.s.  3.  Glyburide 5 mg p.o. b.i.d.  4.  Altace 10 mg p.o. daily.  5.  Skelaxin 400 mg p.o. t.i.d.  6.  Norvasc 10 mg p.o. daily.  7.  Etodolac 500 mg p.o. b.i.d.  8.  Prevacid 60 mg p.o. q. a.m.  9.  Vitamin E and multivitamins.  10. Bayer aspirin.   PAST MEDICAL HISTORY:  1.  Paranoia schizophrenia.  2.  Diabetes.  3.  Hypertension.  4.  Hiatal hernia.  5.  Reflux disease   PAST SURGICAL HISTORY:  1.  Hysterectomy in 1985.  2.  Right knee replacement in 2004.  3.  The patient states her only issues with previous surgeries is increased      paranoia with the strong pain medicines and nausea postop.   SOCIAL HISTORY:  The patient is a 70 year old black female, fairly healthy-  appearing.  Denies any history of smoking or alcohol use.  She is single.  She has got two  grown children.  She lives in a one story house, three  steps.  She is currently retired.   FAMILY PHYSICIAN:  Dr. Debby Bud at [phone],   FAMILY MEDICAL HISTORY:  Mother is deceased from a heart attack.  Father is  deceased from unknown causes.  The patient has got multiple alive brothers  and sisters, and four deceased brothers.   REVIEW OF SYSTEMS:  Positive for she does wear dentures and glasses, she  does have issues with reflux, which is usually well-controlled with the use  of her Prevacid.  She does have increased issues with paranoia with strong  pain medicines.  Otherwise all other review of systems categories are  negative and noncontributory at this time.   PHYSICAL EXAMINATION:  VITAL SIGNS:  Height is 5 foot 3 inches tall, weight  is 153 pounds, blood pressure 142/62,  pulse 80 regular, respirations 12, the  patient is afebrile.  GENERAL:  This is a fairly healthy-appearing black female.  She ambulates  without the use of assistive devices.  She walks very slowly with a fairly  normal balanced gait.  She easily gets on and off the exam table without any  difficulty.  HEENT:  Head was normocephalic, pupils equal, round and reactive,  extraocular movements intact, sclerae is not icteric, conjunctivae was pink  and moist, external ears without deformities, gross hearing is intact, oral  buccal mucosa is pink and moist.  NECK:  Supple.  No palpable lymphadenopathy.  Thyroid region was nontender.  The patient had excellent range of motion of her cervical spine without any  difficulty or tenderness.  CHEST:  Lung sounds were clear and equal bilaterally, no wheezes, rales,  rhonchi or rubs noted.  HEART:  Regular rate and rhythm, no murmurs, rubs, or gallops.  ABDOMEN:  Round, soft, nontender, bowel sounds normoactive, CVA region was  nontender.  EXTREMITIES:  Upper extremities __________ good size and shape.  She had  good range of motion of her shoulders, elbows, and wrists.   Motor strength  was 5/5.  LOWER EXTREMITIES:  Right and left hip had full extension, flexion up to 110  degrees with 20 degrees internal external rotation without any difficulty or  tenderness.  Right knee had a well-healed midline surgical incision, no  instability, she had full extension-flexion back to 115 degrees easily  without any difficulty.  Left knee was slightly swollen with soft tissue,  she had no erythema, she had no effusion, she was sore with palpation  throughout, she very slowly extended her knee to about 5 degrees short of  full extension, and grabbing it with discomfort.  She was able to flex it  back to 100%, she has about 10 degrees varus valgus laxity with stressing.  The calf is soft and nontender.  Ankles were symmetrical with good  dorsiplantar flexion.  PERIPHERAL VASCULAR:  Carotid pulses were 2+, no bruits, radial pulses 2+,  dorsalis pedis pulses were 1+, she had no lower extremity edema.  NEUROLOGIC.  The patient was conscious, alert and appropriate.  Easy  conversation with examiner.  The patient had some issues with reassurance  and whether memory or related to her psychiatric disease, but she constantly  requestioned certain issues in which she had questions about.  Otherwise she  had no other gross neurologic defects noted.  BREASTS:  Exam deferred at this time.  RECTAL:  Exam deferred at this time.  GU:  Exam deferred at this time.   IMPRESSION:  1.  End-stage osteoarthritis left knee.  2.  Paranoia schizophrenia.  3.  Diabetes.  4.  Hypertension.  5.  Hiatal hernia.  6.  Reflux.  7.  History of increased paranoia related to strong narcotics.   PLAN:  The patient will undergo all routine labs and tests prior to having a  left total knee arthroplasty by Dr. Chaney Malling at [facility] on Aug 28, 2004.  The patient will undergo all the routine labs and tests.  The patient does indicate that she is able to take Darvocet without any  problems.   We will try to keep the narcotic use down to a minimum to prevent  any problems with her paranoia.      RWK/MEDQ  D:  08/23/2004  T:  08/23/2004  Job:  [id number]

## 2010-09-07 NOTE — Assessment & Plan Note (Signed)
[facility]                                   ON-CALL NOTE   [name], [name]                       MRN:          [id number]  DATE:01/20/2006                            DOB:          [birth year]    ON CALL PHONE NOTE   DATE OF NOTE:  January 20, 2006 at 5:30.  Patient of Dr. Debby Bud.  Phone  number [phone].   [name] was in the hospital last week, just left on Thursday, because her  sugars were running too high.  She now has some nausea and wonders if she  can take the nausea suppositories that Dr. Debby Bud had prescribed for her.  She did check her sugar, it is only 168.  She has no fever or abdominal  pain.  She has been only throwing up little amounts.   PLAN:  I told her that under the circumstance she described for me it would  probably be okay to try the suppository.  If she has relief and is able to  eat and drink again she would be okay.  If she is not able to eat or drink  then she really does need to go to the emergency room, should call 911 for  transport there for further evaluation if the nausea and vomiting persists.            ______________________________  Karie Schwalbe, MD      RIL/MedQ  DD:  01/20/2006  DT:  01/21/2006  Job #:  [id number]   cc:   Rosalyn Gess. Norins, MD

## 2010-09-07 NOTE — Discharge Summary (Signed)
[name], [name] NO.:  [id number]   MEDICAL RECORD NO.:  [id number]                   PATIENT TYPE:  IPS   LOCATION:  0402                                 FACILITY:  [facility]   PHYSICIAN:  Geoffery Lyons, M.D.                   DATE OF BIRTH:  1941/02/17   DATE OF ADMISSION:  03/21/2003  DATE OF DISCHARGE:  03/29/2003                                 DISCHARGE SUMMARY   CHIEF COMPLAINT AND PRESENT ILLNESS:  This was the first admission to [facility] for this 70 year old single African American female  voluntarily admitted.  History of auditory hallucinations x2 weeks, command-  type, to hurt herself, jump off a building.  History of hallucinations for  years.  Unable to take it anymore.  Daughter will not talk to her now that  she is sick.  Sleep decreased.  Voices are there all day.  Denies any  suicidal or homicidal ideation.   PAST PSYCHIATRIC HISTORY:  First time at [facility].  History  of cutting himself years ago.  Hospitalized at [facility] years ago for nervous  breakdown.  [facility] for outpatient treatment.   ALCOHOL/DRUG HISTORY:  Nonsmoker.  No alcohol or drug use.   MEDICAL HISTORY:  Noninsulin-dependent diabetes mellitus, arterial  hypertension, status post right knee replacement, coronary artery disease.   MEDICATIONS:  1. Prevacid 30 mg, two daily.  2. Darvocet-N one q.4-6 h.  3. Glyburide 5 mg daily.  4. Altace 5 mg daily.  5. Haldol 5 mg in the morning and 20 mg at night.  6. Lodine 500 mg b.i.d.  7. Skelaxin 800 mg, half t.i.d.   PHYSICAL EXAMINATION:  Performed and failed to show any acute findings.   MENTAL STATUS EXAMINATION:  Alert female.  Congenial.  Good eye contact.  Speech clear.  Mood depressed.  Affect flat but pleasant, agreeable, worried  about the voices.  Thought process coherent.  No evidence of delusional  ideas but does endorse auditory hallucinations.   Cognition well preserved.   ADMISSION DIAGNOSES:   AXIS I:  Schizophrenia, paranoid type.   AXIS II:  No diagnosis.   AXIS III:  1. Noninsulin-dependent diabetes mellitus.  2. Arterial hypertension.  3. Irritable bowel syndrome.  4. Coronary artery disease.  5. Status post right knee replacement.   AXIS IV:  Moderate.   AXIS V:  1. Global Assessment of Functioning upon admission:  25  2. Highest Global Assessment of Functioning in the last year:  60   LABORATORY DATA:  CBC within normal limits except hemoglobin 11.4.  Blood  chemistry within normal limits.  Hemoglobin A1c 6.1.  Lipid profile greater  than 235.  Drug screen negative for substances of abuse.   HOSPITAL COURSE:  She was admitted and started intensive individual and  group psychotherapy.  Given the fact that she was  already taking a high dose  of Haldol and it was not helping, we went ahead and started switching.  She  was maintained on the Prevacid, the Darvocet, the Lodine, the Skelaxin,  amitriptyline, glyburide and Altace.  She was given Ativan as needed.  She  was started on Celexa 10 mg daily.  She was started on Abilify 5 mg daily  and Haldol was decreased slowly.  Elavil was discontinued.  She was given  trazodone.  We continued to decrease the Haldol as we increased the Abilify.  She had difficulty with sleep, so she was given trazodone.  Eventually, the  Haldol was discontinued and she was taking Abilify 10 mg.  The Abilify we  were doing in the morning but it was making her sedated, so we switched to  at bedtime.  She continued to evidence the voices, so we gave her some  Loxitane that was effective, so we added Loxitane 10 mg in the morning and  at night with the Abilify and the trazodone was increased to 100 mg at  night.   Internal Medicine saw her and eventually they increased the Lopressor to 150  mg b.i.d. and the Norvasc 10 mg daily.  As the medication adjusted, she did  endorse that the  voices were better.  She had several side effects from the  medication.  She fell a couple of times.  These were addressed.  There was  some resistance on part of the family to be available for her.  She was  indeed on Seroquel, Risperdal, Zyprexa unsuccessfully, so this combination  of Loxitane and Abilify seemed to be effective for what we maintained and,  on March 29, 2003, she was much improved, clear psychiatrically-wise,  endorsed no auditory hallucinations, no evidence of delusion, no suicidal or  homicidal ideation, tolerating the medication well, no side effects, we went  ahead and discharged to outpatient followup.   DISCHARGE DIAGNOSES:   AXIS I:  Schizoaffective disorder.   AXIS II:  No diagnosis.   AXIS III:  1. Noninsulin-dependent diabetes mellitus.  2. Arterial hypertension.  3. Irritable bowel syndrome.  4. Coronary artery disease.  5. Status post right knee replacement.   AXIS IV:  Moderate.   AXIS V:  Global Assessment of Functioning upon discharge:  50   DISCHARGE MEDICATIONS:  1. Celexa 10 mg daily.  2. Prevacid 30 mg daily.  3. Skelaxin 400 mg, two t.i.d.  4. Allegra 180 mg daily.  5. Altace 10 mg daily.  6. Abilify 10 mg at bedtime.  7. Catapres 0.2 mg t.i.d.  8. Glyburide 5 mg b.i.d.  9. Loxitane 10 mg in the morning and at night.  10.      Lopressor 50 mg, three b.i.d.  11.      Darvocet-N 100, one q.4-6 h.  12.      Trazodone 100 mg at bedtime for sleep.  13.      Norvasc 10 mg daily.   FOLLOWUP:  [facility] with Dr. Lang Snow.                                               Geoffery Lyons, M.D.    IL/MEDQ  D:  04/12/2003  T:  04/13/2003  Job:  [id number]

## 2010-09-07 NOTE — H&P (Signed)
   [name], [name]                          ACCOUNT NO.:  [id number]   MEDICAL RECORD NO.:  [id number]                   PATIENT TYPE:  INP   LOCATION:  NA                                   FACILITY:  [facility]   PHYSICIAN:  Rodney A. Chaney Malling, M.D.           DATE OF BIRTH:  [birth year]   DATE OF ADMISSION:  10/19/2002  DATE OF DISCHARGE:                                HISTORY & PHYSICAL   ADDENDUM:   PAST MEDICAL HISTORY:  Schizophrenia, well managed from a medical  standpoint.   ADMISSION DIAGNOSES:  Schizophrenia, chronically managed with medicine.       Legrand Pitts Duffy, P.A.                      Rodney A. Chaney Malling, M.D.    KED/MEDQ  D:  10/12/2002  T:  10/12/2002  Job:  [id number]

## 2010-09-07 NOTE — H&P (Signed)
[name], [name]                          ACCOUNT NO.:  [id number]   MEDICAL RECORD NO.:  [id number]                   PATIENT TYPE:  INP   LOCATION:  NA                                   FACILITY:  [facility]   PHYSICIAN:  Rodney A. Chaney Malling, M.D.           DATE OF BIRTH:  1940/08/13   DATE OF ADMISSION:  10/19/2002  DATE OF DISCHARGE:                                HISTORY & PHYSICAL   CHIEF COMPLAINT:  Right-knee pain for the last 10 years.   HISTORY OF PRESENT ILLNESS:  The patient is a 70 year old black female patient  who presents to Dr. Chaney Malling with a 10-year-history of gradual onset of  progressive right knee pain.  She has no known injury or prior surgery to  her knee.  At this point, the pain in the right knee is described as a  constant dull sensation, diffuse about the joint without radiation.  The  pain increases with standing and decreases with Ultracet loading and  Skelaxin.  She denies any popping, catching, grinding, locking, giving away  or swelling of the knee.  She does have occasional pain that keeps her up at  night.  She has tried Cortisone and Hyalgan injections in the past with no  relief.  She currently does not ambulate with any assistive devices.   ALLERGIES:  Codeine causes nausea and vomiting.   CURRENT MEDICATIONS:  1. Prevacid 30 mg two tablets p.o. q.a.m.  2. Norvasc 10 mg one tablet p.o. q.a.m.  3. Glyburide 5 mg one tablet p.o. q.a.m.  4. Elavil 50 mg one tablet p.o. q.h.s.  5. Skelaxin 800 mg one-half tablet p.o. t.i.d.  6. Lodine 500 mg one tablet p.o. b.i.d.  Last dose October 12, 2002.  7. Haldol 5 mg, three tablets p.o. q.h.s.  8. Enteric-coated aspirin 325 mg one tablet p.o. q.a.m.  9. Ultracet two tablets p.o. b.i.d.  10.      Doxepin 25 mg four tablets p.o. p.r.n. pruritis.  11.      Vitamin E, one tablet p.o. q.a.m.  12.      Multivitamin one tablet p.o. q.a.m.   PAST MEDICAL HISTORY:  1. Type 2 diabetes mellitus for three years.  2. Hypertension diagnosed in 1996.  3. Gastroesophageal reflux disease, diagnosed in 1996.  4. Hiatal hernia diagnosed in 1996.  5. Coronary artery disease treated with a stent in 1996 by Dr. Tenny Craw at     [facility].  6. Irritable bowel syndrome.   She denies any history of thryoid disease, peptic ulcer disease, asthma or  any other chronic medical condition other than noted previously.   PAST SURGICAL HISTORY:  1. Hysterectomy in 1980.  2. She denies any complications from the above procedure.   SOCIAL HISTORY:  She is a 30 to 60 pack a year history of cigarette smoking  which she quit in 1999.  She does not drink any alcohol or  use any drugs.  She is single and has five children.  She lives by herself in a one-story  house with four to five steps into the main entrance.  She is a retired  [employer] trader.   MEDICAL DOCTOR:  Dr. Debby Bud at [facility].  His phone number is [phone].   FAMILY HISTORY:  Her mother died at the age of 53 with hypertension, some  type of cancer, and a heart attack.  Her father died at an unknown age with  a myocardial infarction.  She has seven living brothers and two living  sisters who range in age from 31 to 32 who are all relatively healthy.  She  has two brothers who passed away with a motor vehicle accident.   Her five children are 68, 39, 37, 42 and 44, and one daughter has  hypertension, fibromyalgia, irritable bowel syndrome and osteoarthritis.  She has four daughters and one son.   REVIEW OF SYSTEMS:  She does complain of occasional sinus congestion.  She  also complains of some tightness in her shoulders and her neck and  osteoarthritis in her back and her right knee.  She has dyspnea on exertion  going upstairs.  She thinks this may be due to deconditioning.  She does  alternate between diarrhea and constipation because of her irritable bowel  syndrome.  She does have dentures on the upper jaw line, and she does wear  glasses.   She does not have a living will nor a power of attorney.  All  other systems are negative and noncontributory.   PHYSICAL EXAMINATION:  GENERAL:  A well-developed, well-nourished, mildly  overweight black female, in no acute distress.  She walks with a slight  limp.  She is accompanied by her daughter.  Mood and affect are appropriate.  She talks easily with the examiner.  VITAL SIGNS:  Height 5 feet, 2 inches, weight 161 pounds, BMI 28.5.  Temperature 98 degrees Farenheit.  Pulse 96.  Respirations 24 and BP 126/62.  HEENT:  Normocephalic, atraumatic, without frontal or maxillary sinus  tenderness to palpation.  Conjunctivae are pink.  Sclerae are anicteric.  PERRLA/ EOM's intact.  No visible external ear deformities.  Hearing is  grossly intact.  TM's are pearly gray bilaterally with good light reflex.  Nose and nasal septum are midline.  Nasal mucosa are pink and moist with a  mild amount of exudate.  Buccal mucosa are pink and moist.  Dentures are in  place on the upper jaw line.  Pharynx is without erythema or exudates.  Tongue and uvula are midline.  Tongue without fasciculations and uvula rises  equally with phonation.  NECK:  No visible masses or lesions noted.  Trachea is midline.  No palpable  lymphadenopathy or thyromegaly.  Carotids are +2 bilaterally without bruits.  Full range of motion with the exception of decreased extension of the  cervical spine.  Nontender to palpation along the cervical spine.  CARDIOVASCULAR:  Heart rate and rhythm are regular.  S1 and S2 are present  without rubs, clicks or murmurs noted.  RESPIRATIONS:  Even and unlabored.  Breath sounds are clear to auscultation  bilaterally without rales or wheezes noted.  ABDOMEN:  She has a well-healed, low midline incision that does have some  scarring.  Skin is otherwise intact.  Bowel sounds are present x4 quadrants.  Soft, nontender to palpation without hepatosplenomegaly or CVA tenderness. PULSES:   Femoral pulses +2 bilaterally.  Nontender to  palpation along the  entire length of the vertebral column.  BREASTS/ GU/ RECTAL/ PELVIC:  These exams are deferred at this time.  MUSCULOSKELETAL:  No obvious deformities bilateral upper extremities with  full range of motion of these extremities without pain.  The radial pulse is  +2 bilaterally.  EXTREMITIES:  She has full range of motion of her hips, ankles and toes  bilaterally.  DP and PT pulses are +2.  She has mild +1 pitting edema of  both lower extremities.  Negative calf pain bilaterally, and negative  Homan's sign bilaterally.  The left knee has no obvious deformity.  The skin  is intact without erythema or ecchymosis.  She has full extension and  flexion to 120 degrees with no crepitance.  There may be a small +1  effusion.  Mild pain with palpation along the medial joint line.  Stable to  varus and valgus stress.  Negative anterior drawer.  The right knee has no  obvious deformity.  Skin is intact without erythema or ecchymosis.  She has full extension and  flexion to 120 degrees with a moderate amount of crepitance.  She is acutely  tender to palpation at both the medial and lateral joint line, lateral more  so than the medial.  She does have a +1 effusion, stable to varus and valgus  stress.  Negative anterior drawer.  NEUROLOGIC:  Alert and oriented x3, cranial nerves II-XII are grossly  intact.  Strength 5/5 bilateral upper and lower extremities.  Rapid  alternating movements are intact.  Deep tendon reflexes are 2+ bilateral  upper and lower extremities.  Sensation is intact to light touch.   RADIOLOGIC FINDINGS:  X-rays taken in July of 2003 of both knees showed  progressive collapse of the medial compartment with bone-on-bone contact on  the right and significant collapse on the left.  Both knee arthritis has  gotten worse.   IMPRESSION:  1. End-stage osteoarthritis bilateral knees, right worse than left.  2. Type 2  diabetes mellitus.  3. Hypertension.  4. Gastroesophageal reflux disease.  5. Hiatal hernia.  6. Coronary artery disease with a history of cardiac stent.  7. Irritable bowel syndrome.   PLAN:  The patient will be admitted to [facility] on October 19, 2002,  where she will undergo a right total knee arthroplasty by Dr. Rinaldo Ratel.  She has been cleared for surgery by Dr. Illene Regulus who saw  her on June 10.  He felt she was a good candidate for general anesthesia  with no increased cardiac risks.  She will undergo all of the routine  preoperative laboratory tests and studies prior to this procedure.  If we  have any medical issues while she is hospitalized, we will consult [facility] for medical management.     Legrand Pitts Duffy, P.A.                      Rodney A. Chaney Malling, M.D.    KED/MEDQ  D:  10/12/2002  T:  10/12/2002  Job:  [id number]

## 2010-09-07 NOTE — Discharge Summary (Signed)
[name], [name]                          ACCOUNT NO.:  [id number]   MEDICAL RECORD NO.:  [id number]                   PATIENT TYPE:  INP   LOCATION:  5009                                 FACILITY:  [facility]   PHYSICIAN:  Rodney A. Chaney Malling, M.D.           DATE OF BIRTH:  [birth year]   DATE OF ADMISSION:  10/19/2002  DATE OF DISCHARGE:  10/25/2002                                 DISCHARGE SUMMARY   ADMISSION DIAGNOSES:  1. End-stage osteoarthritis of bilateral knees, right worse than left.  2. Schizophrenia chronically managed with medications.  3. Type 2 diabetes mellitus.  4. Hypertension.  5. Gastrointestinal reflux disease.  6. Hiatal hernia.  7. Coronary artery disease with a history of cardiac stent.  8. Irritable bowel disease.   DISCHARGE DIAGNOSES:  1. End-stage osteoarthritis of bilateral knees, status post right total knee     arthroplasty.  2. Acute blood loss anemia secondary to surgery.  3. Hypokalemia.  4. Postoperative right lower lobe pneumonia.  5. Constipation.  6. Schizophrenia.  7. Type 2 diabetes mellitus.  8. Hypertension.  9. Gastrointestinal reflux disease.  10.      Hiatal hernia.  11.      Coronary artery disease with a history of cardiac stent.  12.      Irritable bowel syndrome.   SURGICAL PROCEDURES:  On November 18, 2002, [name] underwent a right total  knee arthroplasty by Lenard Galloway. Chaney Malling, M.D., assisted by Jamelle Rushing,  P.A.  She had an LCS Complete metal-backed patella cemented size standard,  an LCS Complete primary femoral component cemented size standard right, and  a Dupuy MBT keel tibial tray cemented size 3 with an LCS Complete RPN size  standard 12.5 mm thickness.   COMPLICATIONS:  None.   CONSULTS:  1. Case management, rehabilitation medicine, and physical therapy consults     on October 20, 2002.  2. Occupational therapy consult on October 21, 2002.  3. [facility] consult on October 22, 2002.   HISTORY OF  PRESENT ILLNESS:  This 70 year old, black, female patient  presented to Dr. Chaney Malling with a 10-year history of gradual onset  progressive right knee pain.  She had no known injury to the knee.  The pain  is now constant, dull sensation, diffuse about the joint without radiation.  It increases with standing and decreases with Ultracet.  She has failed  conservative treatment and because of that she is presenting for a right  knee replacement.   HOSPITAL COURSE:  [name] tolerated her surgical procedure well without  immediate postoperative complications.  She was subsequently transferred to  5000.  On postoperative day #1, temperature maximum was 100.6 degrees and  vitals were stable.  The hemoglobin was 8.7 and hematocrit 25.6.  This was  monitored.  She did have an elevated white count at 15.9 and this was also  monitored.  She was switched to  p.o. pain medications.  Her potassium was  supplemented with a potassium level of 3.4.   On postoperative day #2, the temperature maximum was 100.6 degrees, vitals  were stable, and pain was controlled with medications when she remembered to  ask for them.  The white count had increased to 19.8 with a hemoglobin of  8.3 and a hematocrit of 24.5.  She was subsequently transfused with two  units of packed red blood cells.  Blood cultures, chest x-ray, and UA were  obtained.   On postoperative day #3, she complained of productive cough.  Temperature  maximum was 100.3 degrees.  Vitals were stable, except the pulse was 125 and  the BP was 180/88.  The O2 saturation was 93% on 2 L of O2.  The right knee  incision was benign.  The white count had increased to 22.1 with a  hemoglobin of 11.3 and a hematocrit of 33.1.  The chest x-ray on October 21, 2002, had shown increase in bibasilar linear atelectasis and possible right  lower lobe pneumonia.  Because of that, aggressive pulmonary toilet was  started and she was started on Avelox 400 mg IV daily.   An internal medicine  consult from [facility] was obtained and they followed her throughout  the rest of her hospitalization.   On October 23, 2002, temperature maximum 100.3 degrees, pulse still 125, and BP  198/96.  Medicine was adjusting her blood pressure medications.  She  continued to make good progress with physical therapy.  It was slow,  however.  She continued to improve over the next several days and on October 25, 2002, it was felt that she was ready for transfer to rehabilitation.  A  rehabilitation bed became available and she was transferred there at that  time.   DIET:  Continue current hospitalization diet.   DISCHARGE MEDICATIONS:  She is to continue current hospitalization  medications with adjustments to be made per the rehabilitation physicians.   ACTIVITY:  She can be out of bed with weightbearing as tolerated on the  right leg with the use of a walker.  Continue PT and OT per rehabilitation  protocols.   WOUND CARE:  Please clean the incision with Betadine daily and apply dry  dressing.  Staples can be removed and steri-stripped with Benzoin applied on  postoperative day #14.  This can be done in rehabilitation or if she needs  to follow up with Dr. Chaney Malling at that time.   FOLLOWUP:  Please follow up with Dr. Chaney Malling at about postoperative day  #14 if her staples are not removed while she is in rehabilitation, otherwise  she can follow up with Dr. Chaney Malling about a week or two after discharge  from rehabilitation.  She needs to call [phone] for that appointment.   LABORATORY DATA:  The chest x-ray on October 23, 2002, showed mild bibasilar  interstitial prominence with maybe some interstitial edema or infiltrate.  No effusion.  It was felt this had slightly increased since October 21, 2002.   On October 14, 2002, white count 11, hemoglobin 10.9, and hematocrit 33.3.  On October 21, 2002, white count 19.8, hemoglobin 8.3, and hematocrit 24.5.  On  October 22, 2002, white  count 22.1, hemoglobin 11.3, and hematocrit 33.1.  On  October 23, 2002, white count 14.9, hemoglobin 9.6, and hematocrit 28.6.   On October 14, 2002, the glucose was 190.  On October 20, 2002, sodium 134,  potassium 2.4, and glucose  148.  On October 21, 2002, glucose 188.  On October 23, 2002, glucose 155.  On October 23, 2002, B natruretic peptide was 55.8.   The urinalysis on October 14, 2002, showed a specific gravity of 1.043, a small  amount of bilirubin, trace ketones, and all other indices normal.  On October 21, 2002, urinalysis showed a specific gravity of 1.014, greater than 1000  mcg/dl of glucose, trace hemoglobin, a few epithelial cells, and no bacteria  or red or white cells.   All other laboratory studies were within normal limits.      Legrand Pitts Duffy, P.A.                      Rodney A. Chaney Malling, M.D.    KED/MEDQ  D:  11/30/2002  T:  12/01/2002  Job:  [id number]   cc:   Rosalyn Gess. Norins, M.D. [facility]

## 2010-09-07 NOTE — Discharge Summary (Signed)
NAMEYARIANNA, VARBLE NO.:  [id number]   MEDICAL RECORD NO.:  [id number]                   PATIENT TYPE:  INP   LOCATION:  2025                                 FACILITY:  [facility]   PHYSICIAN:  Rene Paci, M.D. [facility] [address]          DATE OF BIRTH:  [birth year]   DATE OF ADMISSION:  12/11/2002  DATE OF DISCHARGE:  12/14/2002                                 DISCHARGE SUMMARY   DISCHARGE DIAGNOSES:  1. Respiratory insufficiency.  2. Hypoxemia.  3. Dyspnea.  4. Labile hypertension.   BRIEF ADMISSION HISTORY:  Ms.  Sprung is a 70 year old African American  female who is status post recent right TKR.  She reports shortness of breath  and dyspnea on exertion x1 month.  She presented to the emergency room with  significant dyspnea.  On arrival, O2 saturations were 78%.  EMS reported the  patient was cool, clammy and diaphoretic.  The patient's O2 saturations  improved with oxygen.   The patient was admitted for further evaluation.   PAST MEDICAL HISTORY:  1. History of schizophrenia, well-managed.  2. Adult-onset diabetes mellitus.  3. Hypertension.  4. Gastroesophageal reflux disease.  5. Hiatal hernia.  6. Coronary artery disease, status post PTCA and stenting in 1996.  7. Irritable bowel syndrome.  8. 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   HOSPITAL COURSE:  PROBLEM #1 - PULMONARY:  The patient presented with  hypoxemia and respiratory insufficiency.  Initial CT scan of the chest was  negative for PE.  There were some small pleural effusions.  There was also  no evidence of infiltrates.  There was concern that she was having an acute  coronary event.  She was admitted to telemetry.  The patient had cardiac  enzymes obtained.  Serial cardiac enzymes were negative for myocardial  infarction; EKG was without ischemia.  She did have an adenosine Cardiolite  that was negative for ischemia as well.  She also had a 2-D echo  that  revealed no wall motion abnormalities and a preserved LV function.  Since  her admission, the patient has not had any further episodes of hypoxemia.  Again, her CT of the chest was negative for infiltrates and pulmonary  embolus.  The etiology of her transient hypoxemia is not clear; she may have  had some sort of bronchospasm, although she was not noted to be wheezing on  admission.  The patient, as noted, has a 60-pack-year history of tobacco and  we will arrange for the patient to have some outpatient PFTs performed.   PROBLEM #2 - HYPERTENSION:  The patient's blood pressure was labile, ranging  from a systolic of 150 to 190.  The patient was on Toprol-XL twice daily; we  did add Altace to the patient's regimen.   PROBLEM #3 - INFECTIOUS DISEASE AND ORTHOPEDICS:  The patient has had a  recent right total knee replacement.  Orthopedics stopped by and saw the  patient and saw no evidence of infection.   LABORATORIES AT DISCHARGE:  Creatinine 0.8.  Hemoglobin 11.6.  D-dimer was  0.89.  Hemoglobin A1c was 6.8%.  Total cholesterol was 220, triglycerides  125, HDL 92, LDL 103.   CT chest was negative for PE and infiltrates, with small bilateral pleural  effusions.  Lower extremity CT negative for DVT.   MEDICATIONS AT DISCHARGE:  1. Glyburide 5 mg daily.  2. Metformin 500 mg b.i.d.  3. Toprol 50 mg b.i.d.  4. Haldol 50 mg q.h.s.  5. Elavil 50 mg q.h.s.  6. Prevacid 30 mg daily.  7. Darvocet every six hours p.r.n.  8. Altace 5 mg daily.   FOLLOWUP:  Follow up with Dr. Rosalyn Gess. Norins, Friday, December 24, 2002,  at 9:30 a.m.  We will also schedule PFTs prior to discharge.      Cornell Barman, P.A. [facility]                  Rene Paci, M.D. [facility]    LC/MEDQ  D:  12/14/2002  T:  12/15/2002  Job:  [id number]   cc:   Thereasa Distance A. Chaney Malling, M.D.  [address] [place]  [place] [address]  Fax: [phone]

## 2010-09-07 NOTE — H&P (Signed)
[name] NO.:  [id number]   MEDICAL RECORD NO.:  [id number]          PATIENT TYPE:  INP   LOCATION:  6706                         FACILITY:  [facility]   PHYSICIAN:  Willow Ora, MD           DATE OF BIRTH:  [birth year]   DATE OF ADMISSION:  01/10/2006  DATE OF DISCHARGE:                                HISTORY & PHYSICAL   CHIEF COMPLAINT:  High sugar.   HISTORY OF PRESENT ILLNESS:  [name] is a 70 year old African-American  female who went to visit the doctor yesterday.  I believe she saw Dr. Efrain Sella.  At that time, she complained of her sugar being very high and also  complained of decreased memory.  She was prescribed Aricept, and blood work  was sent out.  The blood work showed that the blood sugar was more than 700,  and she was advised to come to the emergency room.   PAST MEDICAL HISTORY:  1. Dementia.  2. Diabetes.  3. High cholesterol.  4. Coronary artery disease, status post stent.  Last cardiac      catheterization on the E-chart was in August 2005.  5. History of paranoid schizophrenia which gets worse with use of      narcotics.  6. Hypertension.  7. GERD.  8. History of peripheral neuropathy.   FAMILY HISTORY:  Noncontributory at this time.   SOCIAL HISTORY:  She is a nonsmoker and nondrinker.  She is single.  She has  two grown kids and lives by herself.   REVIEW OF SYSTEMS:  She is feeling weak.  She had several falls in the last  three weeks.  She cannot clarify to me under what circumstances she falls.  However, I reviewed the E-chart, and in the past she has been referred to  neurology for evaluation of dizziness.  She denies any fever, dysuria, chest  pain or shortness of breath.  She admits to some cough without chest  congestion.  She did state that she has some sinus congestion and clear  nasal discharge.   MEDICATIONS:  I reviewed the bottles of medicine that she brought to the  hospital.  At this point, she is  taking:  1. Norvasc 10 1 p.o. daily.  2. ....Marland KitchenMarland Kitchen  4 1 p.o. b.i.d.  3. Altace 10 1 p.o. daily.  4. Aspirin 25 1 p.o. daily.  5. Prevacid 30 1 p.o. daily.  6. Elavil 50 1-2 p.o. nightly p.r.n.  7. Aricept 5 1 p.o. daily.  This was started yesterday.  8. Crestor 10 1 p.o. nightly.  9. Geodon 40 1 morning and 2 at night.  10.Haloperidol 10 1 p.o. b.i.d.  11.Lasix 40 1 p.o. daily.  12.Glyburide 5 1 p.o. b.i.d.  13.Prandin, dose unknown.  14.Benadryl p.r.n. itching.   ALLERGIES:  1. CODEINE.  2. PERCOCET.   PHYSICAL EXAMINATION:  GENERAL:  The patient is alert and oriented in no  apparent distress.  Family is at bedside.  VITAL SIGNS:  She is afebrile with pulse 98, blood pressure 131/76,  respirations 18  and O2 sat 94%.  HEENT:  Oropharynx reveals no dryness.  Nose is slightly congested.  Sinuses  are slightly tender bilaterally.  LUNGS:  Clear to auscultation bilaterally.  CARDIOVASCULAR:  Regular rate and rhythm without murmur.  ABDOMEN:  Not distended.  Soft.  Good bowel sounds.  No organomegaly.  EXTREMITIES:  No edema.  NEUROLOGIC:  Speech and motor are intact.   LABORATORY AND X-RAYS:  Blood work done yesterday at the office showed A1c  16, creatinine 1.2, sodium 133, potassium 3.0, glucose 751, total  cholesterol 142, HDL 66, LDL 53.  At 4:00 p.m. today, blood sugar was 786.  She was started on an insulin drip.  Blood sugar at 7:00 p.m. was 389 and at  8:00 p.m. was 290.  Creatinine remains stable at 1.1 with potassium 3.0.   ASSESSMENT AND PLAN:  1. The patient is admitted to the hospital with uncontrolled diabetes.      One of the questions was if the patient has nonketotic hyperosmolar      syndrome.  However, the patient's mental status is at baseline.  She is      not dehydrated.  She has no renal failure.  Consequently, I doubt      nonketotic hyperosmolar syndrome.  We will, however, have her continue      with IV insulin by glucomander.  I discussed with the  patient and the      family the possibility of being discharged on insulin.  The patient is      not enthusiastic at all about this possibility, but the family states      that they will help.  The decision will have to be made before she is      to go home about whether or not she needs insulin.  2. Hypokalemia.  She has been actively replaced while in the emergency      room.  3. Coronary artery disease.  She is asymptomatic.  4. High cholesterol, stable.  5. Decreased memory.  She just started Aricept.  6. Hypertension, stable.  7. Frequent falls.  We will go ahead and check a CT of the head.  8. Hyponatremia.  We will monitor BMP later on tonight and in the morning.  9. In my mind, there is a question of sinusitis that actually could      explain the extremely high blood sugar because the patient has some      sinus tenderness and chronic congestion.  We will cover her with IV      antibiotics and order CT of the sinuses.      Willow Ora, MD  Electronically Signed     JP/MEDQ  D:  01/10/2006  T:  01/14/2006  Job:  [id number]

## 2010-09-07 NOTE — Letter (Signed)
June 01, 2006     RE:  [name], [name]  MRN:  [id number]  /  DOB:  [birth year]   Dear Milford Cage or Madam:   [name] is a patient that I follow for multiple medical problems.  If she had carious teeth, they definitely need to be extracted because  of her risk for infection or complications therein.   If I can provide any additional information, please do not hesitate to  contact me.   I do believe that the patient is medically stable for dental extraction  as needed.   Thank you for your attention to this matter.   I remain, yours truly,    Sincerely,      Rosalyn Gess. Norins, MD  Electronically Signed    MEN/MedQ  DD: 06/01/2006  DT: 06/01/2006  Job #: [id number]

## 2010-09-07 NOTE — Op Note (Signed)
[name], [name]              ACCOUNT NO.:  [id number]   MEDICAL RECORD NO.:  [id number]          PATIENT TYPE:  INP   LOCATION:  2550                         FACILITY:  [facility]   PHYSICIAN:  Rodney A. Mortenson, M.D.DATE OF BIRTH:  05-30-1940   DATE OF PROCEDURE:  08/28/2004  DATE OF DISCHARGE:                                 OPERATIVE REPORT   PREOPERATIVE DIAGNOSIS:  Severe osteoarthritis left knee.   POSTOPERATIVE DIAGNOSIS:  Severe osteoarthritis left knee.   OPERATION:  DePuy left total knee using a standard femoral component with a  size 3 MBT tibial keel tray with a standard size poly insert 15.5 mm thick  with a standard size patella.  All components glued in.  Assisted with  computer navigation.   SURGEON:  Thereasa Distance a. Chaney Malling, M.D.   ASSISTANT:  Legrand Pitts. Duffy, P.A.   ANESTHESIA:  General.   PROCEDURE:  Patient placed on the operating table in the supine position.  A  pneumatic tourniquet about the left upper thigh.  The entire left lower  extremity was prepped with DuraPrep and draped out in the usual manner.  A  light drape was placed over the operative site.  The left lower extremity  was draped out in the usual manner.  The left lower extremity was then  wrapped out with Esmarch, the tourniquet was elevated.  An incision made  starting above the patella and carried down to the tibial tubercle.  Skin  edges were retracted.  Bleeders were coagulated.  A long median and  parapatellar incision was made and the patella was everted.  The knee was  flexed 90 degrees.  Both the medial and lateral meniscus were then totally  excised as were the cruciate ligaments off the femoral notch area.  Actual  decompression of the soft tissues in the joint was noted.  She had severe  arthritic changes about the knee.  Marginal osteophytes were debrided with a  rongeur off the femoral condyles and margins of the patella.  The knee was  flexed 90 degrees.  At this point, two pins  were placed in the femur and  tibia in the standard position and the computer rays were catched. Good  visualization by the computer.  The hip was put through a full range of  motion to determine the center rotation of hip and the knee.  Using the  computer prompts, the proximal tibia was then mapped in the standard manner.  Both medial and lateral epicondyles of the tibia at the ankle were also  mapped.  Anterior and posterior diameters and the computer was used to  sequentially upload information over the proximal tibia.  In a similar  manner, computer prompts were then used to paint and register the distal  femur.  Once this was accomplished, attention was then turned to the  proximal tibia.  A tibial cutting block was placed at the appropriate level  and using computer prompts it was placed at appropriate level in the slope  and varus and valgus alignment.  This has been pinned in a proper position.  The capture guide  was applied and the proximal tibia was resected.  The  confirmation guide was placed on the cut surfaces and cut surfaces were  adjusted until this was within 0.5 mm of predicted.  Once complete cut of  the tibia was accomplished, attention was then turned to the femur.  Cutting  block was held over the distal femur, the knee flexed 90 degrees and this  was positioned following computer prompting and then fixed in appropriate  position.  The anterior femoral condyle and posterior condyles were then  amputated in a standard manner.  The confirmation guide was placed over the  anterior aspect of the femur and adjusted so that rotation and flexion and  extension varus and valgus was within 0.5 mm of predicted.  Femoral cutting  block #2 was placed over the anterior aspect of femur over the cut surface  and navigated into appropriate position using computer prompting.  Once this  was accomplished, this was affixed with fixation pins and the distal end of  the femur was then  amputated.  A final femoral cutting block was placed over  the distal end of the femur and locked in position and Chamfer cuts were  made, drill holes were placed in the femur.  All of the components were then  removed.  A standard femoral component then slid very nicely on the distal  end of the femur.  This was then removed.  The tibia was then subluxed  anteriorly using a keel, then a size 3 plate was placed over the proximal  end of the tibia and fit line-to-line.  This was locked in position.  The  tibial trial was then attached once the plate was locked and a drill hole  was placed in the proximal end of the tibia.  The trial was then removed and  the wing cutting guide was driven down into the tibia and stabilized the  tibial plate.  The fixation pins were then removed.  A 10 mm tibial poly  trial was then inserted.  A femoral component was placed over the distal end  of _________.  The knee was put through a full range of motion.  There was  excellent balance __________ both in flexion and extension.  There was  slight hyperextension and very little instability.  Attention then turned to  the posterior aspect of the patella.  The patella cutting block was applied  and the posterior aspect of the patella amputated.  Then three drill holes  were placed on the cut surface of the patella using the drill guide.  The  patella trial was inserted and the knee was put through a full range of  motion.  Again there was wonderful range of motion and good stability.  Good  balance of the ligaments in both flexion and extension.  All of the  components were then removed.  Pulsatile lavage was used to remove all  debris.  The glue was mixed and glue was placed on the posterior aspect of  all the components.  The tibial component was inserted first and packed in  place.  Excess glue was removed in the standard manner.  Glue was placed over the distal end of the femur and on the posterior runners of  the femoral  component.  The femoral component was then driven home with an impaction  mallet.  Excess glue was removed.  The tibial poly trial was then inserted  and knee articulated with knee extended.  Glue was placed  on the posterior  aspect of the patella and patella was inserted and held in place with  patella clamp.  Excess glue was removed from all the components.  Once the  glue had cured, the knee was flexed.  Excess glue was removed with a small  osteotome, all debris was removed.  The poly was removed and the pulsatile  lavage was used to clean all of the soft tissues and debride.  At this point  the tourniquet was dropped, the bleeders were coagulated.  A fair amount of  time was spent achieving very nice hemostasis.  Bone wax was placed to put  over the fixation holes drilled in the tibia and the femur.  A trial poly  was then reinserted and knee put through a full range of motion.  There was  felt to be a little instability and the 15 mm poly was then inserted and  this gave excellent stability through varus and valgus stress and slight  hyperextension and full flexion.  A 15 mm final poly was then selected and  inserted in position.  A Hemovac drain was then placed in the wound.  The  long medial parapatellar incision then closed with interrupted Ethibond  sutures.  Vicryl was used to close the subcutaneous tissue and a stainless  steel staple was used to close the skin.  Sterile dressings were applied and  the patient returned to the recovery room in excellent condition.  Technically, I was extremely pleased with the surgical results and outcome  and position of all components.   DRAINS:  Hemovac.   COMPLICATIONS:  None.      RAM/MEDQ  D:  08/28/2004  T:  08/28/2004  Job:  [id number]

## 2010-09-07 NOTE — H&P (Signed)
[name], [name] NO.:  [id number]   MEDICAL RECORD NO.:  [id number]                   PATIENT TYPE:  INP   LOCATION:  1825                                 FACILITY:  [facility]   PHYSICIAN:  Arturo Morton. Riley Kill, M.D. [facility]         DATE OF BIRTH:  22-May-1940   DATE OF ADMISSION:  12/08/2003  DATE OF DISCHARGE:                                HISTORY & PHYSICAL   CHIEF COMPLAINT:  Chest pain.   HISTORY OF PRESENT ILLNESS:  A 70 year old African American female with  known history of CAD, last catheterization with PTCA in 1996, negative  Cardiolite in October 2003, now complaining with chest pain x2 weeks,  constant substernal rating it 3 to 7/10 with pressure, somewhat worse the  last 12 hours.  Associated symptoms including right upper arm pain,  posterior cervical pain, bilateral ankle swelling which is new, shortness of  breath, dyspnea on exertion, diaphoresis and presyncope.  The patient denies  nausea or palpitations with rest pain increased to 3/10.  The patient also  has a random sharp chest pain lasting seconds only, not associated with  activity or other symptoms.  Currently no chest pain secondary to receiving  nitroglycerin.  The patient states she had similar episode in 1996 in which  she had a PTCA.  Films retrieved from microfilm for this patient in May of  1996.  The patient received a left heart catheterization by Arturo Morton.  Riley Kill, M.D.  Catheterization report states that the patient had had a  previous stent placement in the left anterior descending artery.  At this  time, there is no documentation in old medical chart.  The patient states  she does not know anything of having a stent.  She states that the only  thing she knows that she had done was a PTCA done.  The catheterization done  in 1996 was to reassess coronary anatomy.  The left main coronary artery was  free of significant disease.  The LAD beyond the first diagonal branch  there  was an area of segmental 30 to 40% narrowing at the previous stent site.  This does not appear to be a high grade and would unlikely be the case of  rest chest discomfort.  Beyond two more diagonal branches was a second area  of about 40% narrowing. The distal LAD is without critical disease.  First  diagonal branch has an eccentric stenosis seen only in the LAO caudal view.  There was eccentric plaquing and appears to be approximately 70 to 75%  narrowing.  The left circumflex coronary artery provides three marginal  branches.  The circumflex has about a 20% narrowing in the proximal portion.  The right coronary artery is fairly large caliber vessel.  There is a  posterior descending and several posterolateral branches.  The right  coronary artery was free of significant disease.  There was well preserved  global systolic function.  No segmental abnormalities.  Contractions are  noted.  Normal LV function, no evidence of high grade restenosis of the  previous angioplasty site with moderate disease in the left anterior  descending and significant plaque in the eccentric diagonal branch.  Please  note at that time, the patient also  had a pO2 of 58 on room air in the  catheterization lab.  Plan was to get a VQ scan tomorrow to rule out  pulmonary embolism per Dr. Riley Kill.   PAST MEDICAL HISTORY:  Her past medical history also includes:  1. Hypertension.  2. Diabetes.  3. GERD.  4. Hiatal hernia.  5. Osteoarthritis.  6. Irritable bowel syndrome.  7. Schizoaffective disorder.  The patient followed by [facility]     for mental illness.   PAST SURGICAL HISTORY:  Right knee arthroplasty.   Last EF 55% by echo in December of 2004 at our office.   ALLERGIES:  The patient states she becomes severely nauseated and vomiting  with CODEINE and PERCOCET and increased anxiety.   MEDICATIONS:  1. Etodolac 500 mg p.o. b.i.d.  2. Prevacid 30 mg daily.  3. Altace 10 mg  daily.  4. Amitriptyline 50 mg q.h.s.  5. Glyburide 5 mg b.i.d.  6. Norvasc 10 mg daily.  7. Darvocet-N 100 q.4-6h. p.r.n.  8. Skelaxin 800 mg one half t.i.d.  9. Loxapine 10 mg one p.o. q.a.m. three tablets at bedtime.  10.      Aspirin 325 mg.  The patient states she does not take the aspirin     consistently.   SOCIAL HISTORY:  The patient lives in [place], [place], by  herself.  She is disabled due to her mental illness.  She is single, has  five adult children.  She has smoked tobacco 60-pack-year tobacco use.  Exercise:  She walks one half mile every day.  She denies ETOH or substance  abuse.  No diet restrictions.   FAMILY HISTORY:  Mother died at age 77 with MI and CA.  Father deceased,  unknown history.  The patient states she has 15 siblings, no known heart  problems.   REVIEW OF SYMPTOMS:  Positive for sweating and diaphoresis as stated above.  HEENT:  Complains of sinus tenderness.  CARDIOPULMONARY:  Positive for chest  pain, shortness of breath, dyspneic on exertion, peripheral edema,  presyncope.  NEUROPSYCHIATRIC:  Complains of depression, anxiety.  MUSCULOSKELETAL:  Complains of pain, osteoarthritis as stated above.  Period  constipation.  All other systems negative.   PHYSICAL EXAMINATION:  GENERAL APPEARANCE:  An African American female of  stated age, warm and dry, no acute distress.  VITAL SIGNS:  Temperature 98.4, pulse 105, respiratory rate 20, blood  pressure 153/87, rechecked right arm 166/91 manual, left 152/86 manual.  HEENT:  Pupils are equal, round and reactive to light.  Sclerae are clear.  NECK:  Supple without lymphadenopathy.  Negative for bruit.  Negative for  JVD.  LUNGS:  Clear to auscultation bilaterally.  CARDIOVASCULAR:  Heart rate regular rhythm, S1 and S2 without murmurs, rubs,  or gallops.  Pulses +2.  EXTREMITIES:  No clubbing, cyanosis, edema, rash, lesions or petechiae. MUSCULOSKELETAL:  No joint deformities or effusions.   Negative for spinal or  CVA tenderness.  SKIN:  No rashes or lesions noted.  NEUROLOGIC:  Alert and oriented x3.  Cranial nerves II-XII grossly intact.   Chest x-ray with no acute disease noted.  Suboptimal inspiration noted.   EKG with a rate  of 107, rhythm is normal sinus rhythm, questionable T-waves  in leads I and II.  No changes from EKG dated March 26, 2003, negative for  hypertrophy.  PR is 150, QRS 88, QTC 528.   LABORATORY DATA:  Initial lab work in the ED showed wbc 8.0, hemoglobin  12.2, hematocrit 36.1, platelets 325. Sodium 142, potassium 3.0, chloride  107, CO2 22, BUN 7, creatinine 0.8, glucose 177.  Total bilirubin 0.7, AST  26, ALT 31, total protein 6.7, albumin 3.3, magnesium 1.9.  Cardiac enzymes  point of care, troponin is 0.05, myoglobin 51.2.  PT 27, PT 12.1, INR 0.9.   IMPRESSION:  1. Chest pain probable cardiac in origin, although patient does have a     history of chronic back pain.  Will check enzymes q.8h. x3.  Admit to     CCU.  Initiate IV nitroglycerin, heparin drip per pharmacy protocol.  The     patient to receive a now dose of Lopressor and Toprol XL 25 mg  daily.     Plan is for cardiac catheterization tomorrow.  2. Possible congestive heart failure.  Check BNP.  The patient does complain     of increased ankle edema the last few days, negative for edema and JVD at     present.  3. Diabetes.  Glucose 177. Initiate sliding scale coverage, diabetic diet     and continue glyburide.  Will check hemoglobin A1C tomorrow.  4. Hypertension. Not well controlled at this time.  Will add beta-blocker to     regimen  5. Questionable lipid status.  Fasting lipids in a.m.  Possibly will need     statin if cardiac in origin.  6. Back pain.  Questionable pulmonary embolus, aneurysm with sinus     tachycardia.  Will check D-dimer this admission.  If D-dimer is elevated,     will discuss with Dr. Riley Kill, follow-up.  The patient was seen and     examined by Dr.  Riley Kill.  Need to rule out for acute coronary syndrome,     symptoms similar to prior PCI, no definite EKG changes.  Examination     unremarkable.  Will continue to follow the patient and plan for cardiac     catheterization.      Dorian Pod, NP                       Arturo Morton. Riley Kill, M.D. [facility]    MB/MEDQ  D:  12/08/2003  T:  12/08/2003  Job:  [id number]   cc:   Pricilla Riffle, M.D.   Rosalyn Gess Norins, M.D. [facility]

## 2010-09-10 ENCOUNTER — Other Ambulatory Visit: Payer: Self-pay | Admitting: Obstetrics and Gynecology

## 2010-09-10 DIAGNOSIS — Z1231 Encounter for screening mammogram for malignant neoplasm of breast: Secondary | ICD-10-CM

## 2010-09-12 ENCOUNTER — Telehealth: Payer: Self-pay | Admitting: *Deleted

## 2010-09-12 NOTE — Telephone Encounter (Signed)
[name] states the ultram 50mg  1 q 8 hours prn for pain is not helping with her back pain. She is taking it as often as every 4 hours. What do you suggest for management of pts pain. Please Advise

## 2010-09-12 NOTE — Telephone Encounter (Signed)
Routine dosing for tramadol is 50mg  tabs one or two q 6 hours. She may have a refill if needed and she is to try this regimen taking two (2) tablets every 6 hours. If this fails to control her discomfort she will need an OV.

## 2010-09-13 MED ORDER — TRAMADOL HCL 50 MG PO TABS
ORAL_TABLET | ORAL | Status: DC
Start: 2010-09-13 — End: 2010-09-14

## 2010-09-13 NOTE — Telephone Encounter (Signed)
Prescription order faxed to [facility] on the ACT team for tramadol faxed to [phone]

## 2010-09-14 ENCOUNTER — Other Ambulatory Visit: Payer: Self-pay | Admitting: *Deleted

## 2010-09-14 MED ORDER — TRAMADOL HCL 50 MG PO TABS: ORAL_TABLET | ORAL | Status: DC

## 2010-09-14 NOTE — Progress Notes (Signed)
Original RX was not received - reprinted and faxed. [phone]

## 2010-09-18 ENCOUNTER — Other Ambulatory Visit: Payer: Self-pay | Admitting: *Deleted

## 2010-09-18 MED ORDER — MEMANTINE HCL 10 MG PO TABS: 10.0000 mg | ORAL_TABLET | Freq: Two times a day (BID) | ORAL | Status: DC

## 2010-09-18 MED ORDER — PIOGLITAZONE HCL 30 MG PO TABS: 30.0000 mg | ORAL_TABLET | Freq: Every day | ORAL | Status: DC

## 2010-09-18 MED ORDER — DONEPEZIL HCL 10 MG PO TABS: 10.0000 mg | ORAL_TABLET | Freq: Every evening | ORAL | Status: DC | PRN

## 2010-09-24 ENCOUNTER — Other Ambulatory Visit: Payer: Self-pay | Admitting: Internal Medicine

## 2010-10-01 ENCOUNTER — Other Ambulatory Visit: Payer: Self-pay | Admitting: Internal Medicine

## 2010-10-01 ENCOUNTER — Telehealth: Payer: Self-pay | Admitting: *Deleted

## 2010-10-01 MED ORDER — TRAMADOL HCL 50 MG PO TABS: 100.0000 mg | ORAL_TABLET | Freq: Three times a day (TID) | ORAL | Status: DC

## 2010-10-01 NOTE — Telephone Encounter (Signed)
2 tabs tid = #180, refill x 3

## 2010-10-01 NOTE — Telephone Encounter (Signed)
Rx for tramadol is 1 - 2 q 6 hrs prn. Last qty was # 60, a 2 wk supply. What qty is ok for 1 mth supply?

## 2010-10-12 ENCOUNTER — Telehealth: Payer: Self-pay | Admitting: *Deleted

## 2010-10-12 NOTE — Telephone Encounter (Signed)
Dr. Berenda Morale office called [name] patient was there] stating that Pt is requesting a referral for PT.?

## 2010-10-15 NOTE — Telephone Encounter (Signed)
Will need ov

## 2010-10-15 NOTE — Telephone Encounter (Signed)
Pt. Informed.

## 2010-10-16 ENCOUNTER — Ambulatory Visit: Payer: Medicare Other

## 2010-10-17 ENCOUNTER — Other Ambulatory Visit: Payer: Self-pay | Admitting: *Deleted

## 2010-10-17 MED ORDER — GLUCOSE BLOOD VI STRP: ORAL_STRIP | Status: AC

## 2010-10-30 ENCOUNTER — Telehealth: Payer: Self-pay | Admitting: *Deleted

## 2010-10-30 NOTE — Telephone Encounter (Signed)
Peggy - RN w/Pyschotheraputic services called - MD is worried that tramadol is interacting with pt's behavioral health meds. They are worried about unsteady gain, increased forgetfulness and slurred speech. They would like to see pt come off this med and want to know what MD suggests as alternative for pain?   Currently is taking tramadol 50 mg 2 every 6 hours PRN pain.

## 2010-10-30 NOTE — Telephone Encounter (Signed)
May stop tramadol. MAy take otc ibuprofen 200mg  two or three doses daily. Watch for GI side effects.

## 2010-10-31 NOTE — Telephone Encounter (Signed)
Left vm for RN to call office back.

## 2010-10-31 NOTE — Telephone Encounter (Signed)
Chronic back pain. If she cannot take ibuprofen we can try meloxicam 15mg  once a day. She needs a follow-up OV next week ( ok to add on).

## 2010-10-31 NOTE — Telephone Encounter (Signed)
Spoke w/RN - They have already been holding tramadol for patient b/c of their concerns. Pt reports she "can not take ibuprofen" - what do you suggest now?

## 2010-11-05 MED ORDER — MELOXICAM 15 MG PO TABS: 15.0000 mg | ORAL_TABLET | Freq: Every day | ORAL | Status: DC

## 2010-11-05 NOTE — Telephone Encounter (Signed)
LMOM for Pt.Scheduled in last open slot for this week Friday 11/09/10 @11 :30am Msge for Pt to call if this will not work into her schedule; also to let us know if she would like to try the Meloxicam suggested by Dr Debby Bud.

## 2010-11-06 ENCOUNTER — Other Ambulatory Visit: Payer: Self-pay | Admitting: *Deleted

## 2010-11-06 DIAGNOSIS — R300 Dysuria: Secondary | ICD-10-CM

## 2010-11-06 MED ORDER — OMEPRAZOLE 20 MG PO CPDR: 40.0000 mg | DELAYED_RELEASE_CAPSULE | ORAL | Status: DC

## 2010-11-06 MED ORDER — RAMIPRIL 10 MG PO CAPS: 10.0000 mg | ORAL_CAPSULE | Freq: Every day | ORAL | Status: DC

## 2010-11-06 MED ORDER — AMITRIPTYLINE HCL 25 MG PO TABS: 25.0000 mg | ORAL_TABLET | Freq: Every day | ORAL | Status: DC

## 2010-11-09 ENCOUNTER — Encounter: Payer: Self-pay | Admitting: Internal Medicine

## 2010-11-09 ENCOUNTER — Other Ambulatory Visit (INDEPENDENT_AMBULATORY_CARE_PROVIDER_SITE_OTHER): Payer: Medicare Other

## 2010-11-09 ENCOUNTER — Ambulatory Visit (INDEPENDENT_AMBULATORY_CARE_PROVIDER_SITE_OTHER): Payer: Medicare Other | Admitting: Internal Medicine

## 2010-11-09 DIAGNOSIS — N3000 Acute cystitis without hematuria: Secondary | ICD-10-CM

## 2010-11-09 DIAGNOSIS — E119 Type 2 diabetes mellitus without complications: Secondary | ICD-10-CM

## 2010-11-09 DIAGNOSIS — M549 Dorsalgia, unspecified: Secondary | ICD-10-CM

## 2010-11-09 DIAGNOSIS — R300 Dysuria: Secondary | ICD-10-CM

## 2010-11-09 LAB — HEMOGLOBIN A1C: Hgb A1c MFr Bld: 7.7 % — ABNORMAL HIGH (ref 4.6–6.5)

## 2010-11-09 LAB — COMPREHENSIVE METABOLIC PANEL
ALT: 15 U/L (ref 0–35)
AST: 16 U/L (ref 0–37)
Alkaline Phosphatase: 76 U/L (ref 39–117)
BUN: 20 mg/dL (ref 6–23)
Calcium: 10.2 mg/dL (ref 8.4–10.5)
Chloride: 105 mEq/L (ref 96–112)
Creatinine, Ser: 1.2 mg/dL (ref 0.4–1.2)
Potassium: 4.2 mEq/L (ref 3.5–5.1)

## 2010-11-09 LAB — POCT URINALYSIS DIPSTICK
Ketones, UA: NEGATIVE
Nitrite, UA: NEGATIVE
pH, UA: 5

## 2010-11-09 MED ORDER — GABAPENTIN 300 MG PO CAPS: ORAL_CAPSULE | ORAL | Status: DC

## 2010-11-09 MED ORDER — GABAPENTIN 300 MG PO CAPS: 300.0000 mg | ORAL_CAPSULE | Freq: Three times a day (TID) | ORAL | Status: DC

## 2010-11-09 MED ORDER — SULFAMETHOXAZOLE-TRIMETHOPRIM 800-160 MG PO TABS: 1.0000 | ORAL_TABLET | Freq: Two times a day (BID) | ORAL | Status: AC

## 2010-11-09 NOTE — Assessment & Plan Note (Signed)
Recurrent acute cystitis mild.   Plan - Septra DS bid x 5

## 2010-11-09 NOTE — Progress Notes (Signed)
Subjective:    Patient ID: [name], female    DOB: [birth year], 70 y.o.   MRN: [id number]  HPI Ms. Corpening presents with multiple complaints: she has increased urinary frequency and dysuria; she has chronic low back pain - she reports that tramadol causes memory problems and she has stopped taking it; she says her ears are stopped up and her hearing is impaired. She is on 22 different medications but they come in a prepak from her pharmacy and she is not sure exactly what she is taking. She reports that although APAP is on her list that she has a reaction to it but cannot relate the symptoms that make it intolerable.   Past Medical History  Diagnosis Date  . Anemia, iron deficiency   . Anxiety   . CAD (coronary artery disease)   . Type II or unspecified type diabetes mellitus without mention of complication, not stated as uncontrolled   . GERD (gastroesophageal reflux disease)   . HTN (hypertension)   . Peripheral neuropathy   . Schizophrenia   . Hyperlipidemia   . Allergic rhinitis   . 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 Dementia   . IBS (irritable bowel syndrome)   . Syncope     EP study 1996 w/atrial tachycardia  . Esophageal motility disorder   . Renal insufficiency    Past Surgical History  Procedure Date  . Abdominal hysterectomy   . Ptca 1995  . Knee arthroplasty     Right   . Breast biopsy     Negative   Family History  Problem Relation Age of Onset  . Schizophrenia Neg Hx   . Coronary artery disease Neg Hx   . Colon cancer Neg Hx   . Breast cancer Neg Hx   . Hypertension Other   . Diabetes Other    History   Social History  . Marital Status: Single    Spouse Name: N/A    Number of Children: N/A  . Years of Education: N/A   Occupational History  . Not on file.   Social History Main Topics  . Smoking status: Former Games developer  . Smokeless tobacco: Never Used  . Alcohol Use: No  . Drug Use: No  . Sexually Active:  Not on file   Other Topics Concern  . Not on file   Social History Narrative   11th grade education  Never married  5 children: 4 daughters, 1 son, 7 grandchildren  Lives alone  Has supportive family       Review of Systems Review of Systems  Constitutional:  Negative for fever, chills, activity change and unexpected weight change.  HEENT:  Positive for hearing loss. Negative for ear pain, congestion, neck stiffness and postnasal drip. Negative for sore throat or swallowing problems. Negative for dental complaints.   Eyes: Negative for vision loss or change in visual acuity.  Respiratory: Negative for chest tightness and wheezing.   Cardiovascular: Negative for chest pain and palpitation. No decreased exercise tolerance Gastrointestinal: No change in bowel habit. No bloating or gas. No reflux or indigestion Genitourinary: Positive for urgency, frequency, flank pain and difficulty urinating.  Musculoskeletal: Negative for myalgias, back pain, arthralgias and gait problem.  Neurological: Negative for dizziness, tremors, weakness and headaches.  Hematological: Negative for adenopathy.  Psychiatric/Behavioral: Negative for behavioral problems and dysphoric mood.       Objective:   Physical Exam Vitals noted Gen'l - older AA female in  no acute distress HEENT - EACs/TMs normal Neck - supple Chest - CTAP Cor - RRR Abdomen - not tender to palpation in the suprapubic region, mild flank tenderness to percussion Ext - no edema.   Dip U/a - trace LE, protein. Specific gravity 1.030      Assessment & Plan:

## 2010-11-09 NOTE — Patient Instructions (Addendum)
Bladder infection - mildly positive urinalysis. Plan take septra DS twice a day for 5 days.  Back pain - many drug allergies. Take tylenol 500mg  3 times a day. Stop the tramadol. Continue the meloxicam 15mg  once a day. Add gabapentin 300mg  once a day for 3 days, twice a day for 3 days then 3 times a dayt. Call me with results.  Ears - normal on exam.  Take sudafed 30mg  twice a day for ear congestion. Over the counter.

## 2010-11-09 NOTE — Assessment & Plan Note (Signed)
For chronic back pain in setting of intolerance to tramadol, failure of APAP, intolerance to codeine, hydrocodone, lack of relief with meloxicam will give a rtrial of gabapentin 300 mg tid starting at once a day and increasing to tid over 9 days. Will continue meloxicam and APAP.

## 2010-11-09 NOTE — Assessment & Plan Note (Signed)
Due for A1C with recommendations to follow

## 2010-11-22 ENCOUNTER — Telehealth: Payer: Self-pay | Admitting: *Deleted

## 2010-11-22 DIAGNOSIS — R350 Frequency of micturition: Secondary | ICD-10-CM

## 2010-11-22 NOTE — Telephone Encounter (Signed)
[employer] RN called - Pt completed septra DS and c/o recurrent UTI symptoms. Ordered u/a & culture to recheck - They will coordinate transportation, to call RN when we get results.

## 2010-11-23 ENCOUNTER — Other Ambulatory Visit: Payer: Self-pay | Admitting: Internal Medicine

## 2010-12-05 ENCOUNTER — Telehealth: Payer: Self-pay

## 2010-12-05 NOTE — Telephone Encounter (Signed)
Someone from the ACT team called [facility] requesting a call back. She states that patient says that her pain medication is not working. Thanks

## 2010-12-06 MED ORDER — GABAPENTIN 600 MG PO TABS: 600.0000 mg | ORAL_TABLET | Freq: Three times a day (TID) | ORAL | Status: DC

## 2010-12-06 NOTE — Telephone Encounter (Signed)
Left message for ACT nurse to callback office

## 2010-12-06 NOTE — Telephone Encounter (Signed)
Informed ACT nurse of MD's advisement-they are requesting new order for Gabapentin be faxed to [phone]

## 2010-12-06 NOTE — Telephone Encounter (Signed)
Ok to prepare new rx for gabapentin 600 mg #90, 1 po tid. 11 refills

## 2010-12-06 NOTE — Telephone Encounter (Signed)
Record indicates she is taking 300mg  tid. Recommend going to 600mg  tid and we can continue to push the dose

## 2010-12-06 NOTE — Telephone Encounter (Signed)
[facility] ACT Nurse states that pt says that Gabapentin is not working for her

## 2010-12-07 NOTE — Telephone Encounter (Signed)
Faxed prescription for Gabapentin 600mg  SIG take one tablet by mouth three times a day with qty of 90 and 3 refills

## 2010-12-25 ENCOUNTER — Emergency Department (HOSPITAL_COMMUNITY): Payer: Medicare Other

## 2010-12-25 ENCOUNTER — Emergency Department (HOSPITAL_COMMUNITY)
Admission: EM | Admit: 2010-12-25 | Discharge: 2010-12-25 | Disposition: A | Discharge status: Home / Self Care | Payer: Medicare Other | Attending: Emergency Medicine | Admitting: Emergency Medicine

## 2010-12-25 DIAGNOSIS — R51 Headache: Secondary | ICD-10-CM | POA: Insufficient documentation

## 2010-12-25 DIAGNOSIS — E119 Type 2 diabetes mellitus without complications: Secondary | ICD-10-CM | POA: Insufficient documentation

## 2010-12-25 DIAGNOSIS — R5383 Other fatigue: Secondary | ICD-10-CM | POA: Insufficient documentation

## 2010-12-25 DIAGNOSIS — M545 Low back pain, unspecified: Secondary | ICD-10-CM | POA: Insufficient documentation

## 2010-12-25 DIAGNOSIS — I10 Essential (primary) hypertension: Secondary | ICD-10-CM | POA: Insufficient documentation

## 2010-12-25 DIAGNOSIS — N390 Urinary tract infection, site not specified: Secondary | ICD-10-CM | POA: Insufficient documentation

## 2010-12-25 DIAGNOSIS — M47812 Spondylosis without myelopathy or radiculopathy, cervical region: Secondary | ICD-10-CM | POA: Insufficient documentation

## 2010-12-25 DIAGNOSIS — R5381 Other malaise: Secondary | ICD-10-CM | POA: Insufficient documentation

## 2010-12-25 DIAGNOSIS — W19XXXA Unspecified fall, initial encounter: Secondary | ICD-10-CM | POA: Insufficient documentation

## 2010-12-25 DIAGNOSIS — R42 Dizziness and giddiness: Secondary | ICD-10-CM | POA: Insufficient documentation

## 2010-12-25 DIAGNOSIS — Y9210 Unspecified residential institution as the place of occurrence of the external cause: Secondary | ICD-10-CM | POA: Insufficient documentation

## 2010-12-25 LAB — CBC
HCT: 35.9 % — ABNORMAL LOW (ref 36.0–46.0)
Hemoglobin: 11.3 g/dL — ABNORMAL LOW (ref 12.0–15.0)
MCH: 30.5 pg (ref 26.0–34.0)
MCHC: 31.5 g/dL (ref 30.0–36.0)
RDW: 15.8 % — ABNORMAL HIGH (ref 11.5–15.5)

## 2010-12-25 LAB — DIFFERENTIAL
Basophils Absolute: 0 10*3/uL (ref 0.0–0.1)
Lymphs Abs: 2.4 10*3/uL (ref 0.7–4.0)
Monocytes Absolute: 0.7 10*3/uL (ref 0.1–1.0)
Neutro Abs: 7.7 10*3/uL (ref 1.7–7.7)

## 2010-12-25 LAB — CK: Total CK: 172 U/L (ref 7–177)

## 2010-12-25 LAB — COMPREHENSIVE METABOLIC PANEL
Albumin: 3.8 g/dL (ref 3.5–5.2)
Alkaline Phosphatase: 80 U/L (ref 39–117)
BUN: 19 mg/dL (ref 6–23)
Calcium: 10 mg/dL (ref 8.4–10.5)
GFR calc Af Amer: >60 mL/min (ref >60)
Glucose, Bld: 122 mg/dL — ABNORMAL HIGH (ref 70–99)
Potassium: 4 mEq/L (ref 3.5–5.1)
Sodium: 143 mEq/L (ref 135–145)
Total Protein: 7.7 g/dL (ref 6.0–8.3)

## 2010-12-25 LAB — URINALYSIS, ROUTINE W REFLEX MICROSCOPIC
Nitrite: NEGATIVE
Specific Gravity, Urine: 1.016 (ref 1.005–1.030)
Urobilinogen, UA: 0.2 mg/dL (ref 0.0–1.0)
pH: 7 (ref 5.0–8.0)

## 2010-12-25 LAB — URINE MICROSCOPIC-ADD ON

## 2010-12-25 LAB — POCT I-STAT TROPONIN I: Troponin i, poc: 0 ng/mL (ref 0.00–0.08)

## 2010-12-27 LAB — URINE CULTURE

## 2011-01-01 ENCOUNTER — Other Ambulatory Visit: Payer: Self-pay | Admitting: *Deleted

## 2011-01-01 MED ORDER — MELOXICAM 15 MG PO TABS: 15.0000 mg | ORAL_TABLET | Freq: Every day | ORAL | Status: DC

## 2011-01-01 MED ORDER — INSULIN PEN NEEDLE 30G X 8 MM MISC: 1.0000 | Status: DC | PRN

## 2011-01-01 MED ORDER — INSULIN ASPART PROT & ASPART (70-30 MIX) 100 UNIT/ML ~~LOC~~ SUSP: 25.0000 [IU] | Freq: Two times a day (BID) | SUBCUTANEOUS | Status: DC

## 2011-01-04 ENCOUNTER — Other Ambulatory Visit: Payer: Self-pay | Admitting: Internal Medicine

## 2011-01-05 ENCOUNTER — Emergency Department (HOSPITAL_COMMUNITY)
Admission: EM | Admit: 2011-01-05 | Discharge: 2011-01-05 | Disposition: A | Discharge status: Home / Self Care | Payer: Medicare Other | Attending: Emergency Medicine | Admitting: Emergency Medicine

## 2011-01-05 DIAGNOSIS — E119 Type 2 diabetes mellitus without complications: Secondary | ICD-10-CM | POA: Insufficient documentation

## 2011-01-05 DIAGNOSIS — Z794 Long term (current) use of insulin: Secondary | ICD-10-CM | POA: Insufficient documentation

## 2011-01-05 DIAGNOSIS — F0390 Unspecified dementia without behavioral disturbance: Secondary | ICD-10-CM | POA: Insufficient documentation

## 2011-01-05 DIAGNOSIS — R5381 Other malaise: Secondary | ICD-10-CM | POA: Insufficient documentation

## 2011-01-05 DIAGNOSIS — I10 Essential (primary) hypertension: Secondary | ICD-10-CM | POA: Insufficient documentation

## 2011-01-05 LAB — DIFFERENTIAL
Basophils Absolute: 0 10*3/uL (ref 0.0–0.1)
Basophils Relative: 0 % (ref 0–1)
Lymphocytes Relative: 24 % (ref 12–46)
Monocytes Absolute: 0.8 10*3/uL (ref 0.1–1.0)
Neutro Abs: 8.6 10*3/uL — ABNORMAL HIGH (ref 1.7–7.7)
Neutrophils Relative %: 69 % (ref 43–77)

## 2011-01-05 LAB — CBC
HCT: 34.9 % — ABNORMAL LOW (ref 36.0–46.0)
Hemoglobin: 11.2 g/dL — ABNORMAL LOW (ref 12.0–15.0)
MCHC: 32.1 g/dL (ref 30.0–36.0)
WBC: 12.5 10*3/uL — ABNORMAL HIGH (ref 4.0–10.5)

## 2011-01-05 LAB — BASIC METABOLIC PANEL
Chloride: 100 mEq/L (ref 96–112)
GFR calc Af Amer: >60 mL/min (ref >60)
GFR calc non Af Amer: 58 mL/min — ABNORMAL LOW (ref >60)
Glucose, Bld: 107 mg/dL — ABNORMAL HIGH (ref 70–99)
Potassium: 4.1 mEq/L (ref 3.5–5.1)
Sodium: 137 mEq/L (ref 135–145)

## 2011-01-05 LAB — URINALYSIS, ROUTINE W REFLEX MICROSCOPIC
Bilirubin Urine: NEGATIVE
Hgb urine dipstick: NEGATIVE
Hgb urine dipstick: NEGATIVE
Nitrite: NEGATIVE
Protein, ur: NEGATIVE mg/dL
Specific Gravity, Urine: 1.019 (ref 1.005–1.030)
Urobilinogen, UA: 0.2 mg/dL (ref 0.0–1.0)
Urobilinogen, UA: 0.2 mg/dL (ref 0.0–1.0)
pH: 6.5 (ref 5.0–8.0)

## 2011-01-05 LAB — URINE MICROSCOPIC-ADD ON

## 2011-01-08 ENCOUNTER — Other Ambulatory Visit: Payer: Self-pay | Admitting: *Deleted

## 2011-01-08 MED ORDER — DONEPEZIL HCL 10 MG PO TABS: 10.0000 mg | ORAL_TABLET | Freq: Every evening | ORAL | Status: DC | PRN

## 2011-01-14 ENCOUNTER — Ambulatory Visit (INDEPENDENT_AMBULATORY_CARE_PROVIDER_SITE_OTHER): Payer: Medicare Other | Admitting: Internal Medicine

## 2011-01-14 DIAGNOSIS — R5381 Other malaise: Secondary | ICD-10-CM

## 2011-01-14 DIAGNOSIS — N3000 Acute cystitis without hematuria: Secondary | ICD-10-CM

## 2011-01-14 DIAGNOSIS — F068 Other specified mental disorders due to known physiological condition: Secondary | ICD-10-CM

## 2011-01-14 DIAGNOSIS — I10 Essential (primary) hypertension: Secondary | ICD-10-CM

## 2011-01-14 DIAGNOSIS — G609 Hereditary and idiopathic neuropathy, unspecified: Secondary | ICD-10-CM

## 2011-01-14 MED ORDER — SULFAMETHOXAZOLE-TRIMETHOPRIM 800-160 MG PO TABS: 1.0000 | ORAL_TABLET | Freq: Two times a day (BID) | ORAL | Status: AC

## 2011-01-14 NOTE — Patient Instructions (Signed)
Reviewed all hospital records- everything was normal. No need for additional tests for weakness.  Ears are clear: no wax  Build up and hearing is grossly normal to finger rub.  You had a mild Urinary tract infection at the hospital. A Rx for Septra DS has been sent to your pharmacy.  For memory - I would not recommend any change in the dosage of Namenda that you now take twice a day or the aricept which you take once a day. You may want to check with you psychiatrist about the medications they prescribe as contributing to your dizziness and memory problems.

## 2011-01-15 NOTE — Assessment & Plan Note (Signed)
In ED positive U/A  Plan - septra DS bid x 7

## 2011-01-15 NOTE — Progress Notes (Signed)
  Subjective:    Patient ID: [name], female    DOB: [birth year], 70 y.o.   MRN: [id number]  HPI [name] presents for persistent weakness, dizziness and she feels her hearing is blocked by ear wax. She was recently seen Sept 4th in the ED - all records reviewed: normal CXR, C-spine, Thoracic spine, lumbar spine, nl CBC, Cmet, Negative CT brain and neck. U/A weakly positive with ED report listing Keflex as prescribed but patient denies any Rx given.   She continues to have chronic dizziness and weakness. She continues to report hearing loss. She has gained a lot of weight.  Past Medical History  Diagnosis Date  . Anemia, iron deficiency   . Anxiety   . CAD (coronary artery disease)   . Type II or unspecified type diabetes mellitus without mention of complication, not stated as uncontrolled   . GERD (gastroesophageal reflux disease)   . HTN (hypertension)   . Peripheral neuropathy   . Schizophrenia   . Hyperlipidemia   . Allergic rhinitis   . 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 Dementia   . IBS (irritable bowel syndrome)   . Syncope     EP study 1996 w/atrial tachycardia  . Esophageal motility disorder   . Renal insufficiency    Past Surgical History  Procedure Date  . Abdominal hysterectomy   . Ptca 1995  . Knee arthroplasty     Right   . Breast biopsy     Negative   Family History  Problem Relation Age of Onset  . Schizophrenia Neg Hx   . Coronary artery disease Neg Hx   . Colon cancer Neg Hx   . Breast cancer Neg Hx   . Hypertension Other   . Diabetes Other    History   Social History  . Marital Status: Single    Spouse Name: N/A    Number of Children: N/A  . Years of Education: N/A   Occupational History  . Not on file.   Social History Main Topics  . Smoking status: Former Games developer  . Smokeless tobacco: Never Used  . Alcohol Use: No  . Drug Use: No  . Sexually Active: Not on file   Other Topics Concern  . Not  on file   Social History Narrative   11th grade education  Never married  5 children: 4 daughters, 1 son, 7 grandchildren  Lives alone  Has supportive family       Review of Systems System review is negative for any constitutional, cardiac, pulmonary, GI or neuro symptoms or complaints     Objective:   Physical Exam Vitals noted overwight AA woman in no acute distress HEENT - EAC/s TMs normal  Cor - RRR       Assessment & Plan:  Hearing loss - no cerumen impaction.  Plan - for continued problem will need audiology evaluation.

## 2011-01-15 NOTE — Assessment & Plan Note (Signed)
BP Readings from Last 3 Encounters:  01/14/11 122/60  11/09/10 122/70  01/09/10 148/70   Good control and normal labs Sept 4th

## 2011-01-15 NOTE — Assessment & Plan Note (Signed)
Full ED evaluation with labs and imaging negative.  Plan - recommend she discuss her symptoms with psychiatry with possible medication side effects.

## 2011-01-15 NOTE — Assessment & Plan Note (Signed)
She was tried on gabapentin but she stopped as ineffective with bothersome side effects: lethargy. No additional medications prescribed today.

## 2011-01-15 NOTE — Assessment & Plan Note (Signed)
Seh is asking to increase medications. Reviewed her doses and other medications.  Plan - continue Aricept 10mg  daily and Namenda 10 mg bid.

## 2011-01-17 LAB — URINE MICROSCOPIC-ADD ON

## 2011-01-17 LAB — URINALYSIS, ROUTINE W REFLEX MICROSCOPIC
Bilirubin Urine: NEGATIVE
Bilirubin Urine: NEGATIVE
Glucose, UA: NEGATIVE
Glucose, UA: NEGATIVE
Hgb urine dipstick: NEGATIVE
Ketones, ur: NEGATIVE
Ketones, ur: NEGATIVE
pH: 6.5
pH: 6

## 2011-01-17 LAB — DIFFERENTIAL
Lymphocytes Relative: 29
Lymphs Abs: 2.8
Monocytes Absolute: 0.6
Monocytes Relative: 6
Neutro Abs: 6.4

## 2011-01-17 LAB — CBC
MCHC: 34
MCV: 93.4
RBC: 4.09

## 2011-01-17 LAB — POCT I-STAT, CHEM 8
BUN: 16
Calcium, Ion: 1.14
Chloride: 109
Creatinine, Ser: 0.7
Glucose, Bld: 131 — ABNORMAL HIGH

## 2011-01-17 LAB — URINE CULTURE

## 2011-01-23 LAB — URINE CULTURE: Colony Count: >=100000

## 2011-01-23 LAB — URINALYSIS, ROUTINE W REFLEX MICROSCOPIC
Bilirubin Urine: NEGATIVE
Hgb urine dipstick: NEGATIVE
Specific Gravity, Urine: 1.012
pH: 7

## 2011-01-23 LAB — DIFFERENTIAL
Basophils Absolute: 0.1
Basophils Relative: 2 — ABNORMAL HIGH
Eosinophils Absolute: 0
Neutrophils Relative %: 55

## 2011-01-23 LAB — COMPREHENSIVE METABOLIC PANEL
ALT: 26
Alkaline Phosphatase: 96
CO2: 30
Calcium: 10
GFR calc non Af Amer: >60
Glucose, Bld: 131 — ABNORMAL HIGH
Sodium: 139

## 2011-01-23 LAB — CBC
Hemoglobin: 12.6
MCHC: 33.2
RBC: 3.99

## 2011-01-23 LAB — RAPID URINE DRUG SCREEN, HOSP PERFORMED
Barbiturates: NOT DETECTED
Opiates: NOT DETECTED

## 2011-01-23 LAB — URINE MICROSCOPIC-ADD ON

## 2011-02-01 LAB — CBC
HCT: 38.7
Hemoglobin: 12.9
MCV: 91.9
Platelets: 267
RDW: 14.9 — ABNORMAL HIGH
WBC: 12.6 — ABNORMAL HIGH

## 2011-02-01 LAB — DIFFERENTIAL
Basophils Absolute: 0
Basophils Relative: 0
Lymphocytes Relative: 32
Monocytes Absolute: 0.8 — ABNORMAL HIGH
Neutro Abs: 7.7
Neutrophils Relative %: 61

## 2011-02-01 LAB — BASIC METABOLIC PANEL
Calcium: 9.8
Creatinine, Ser: 0.75
GFR calc Af Amer: >60
GFR calc non Af Amer: >60
Sodium: 145

## 2011-02-01 LAB — URINALYSIS, ROUTINE W REFLEX MICROSCOPIC
Hgb urine dipstick: NEGATIVE
Nitrite: NEGATIVE
Protein, ur: NEGATIVE
Specific Gravity, Urine: 1.017
Urobilinogen, UA: 1

## 2011-02-01 LAB — COMPREHENSIVE METABOLIC PANEL
Albumin: 3.8
BUN: 9
Chloride: 104
Creatinine, Ser: 0.82
Glucose, Bld: 219 — ABNORMAL HIGH
Total Bilirubin: 0.6

## 2011-02-01 LAB — VALPROIC ACID LEVEL
Valproic Acid Lvl: 46.7 — ABNORMAL LOW
Valproic Acid Lvl: 67.7

## 2011-02-07 LAB — URINALYSIS, ROUTINE W REFLEX MICROSCOPIC
Bilirubin Urine: NEGATIVE
Hgb urine dipstick: NEGATIVE
Ketones, ur: NEGATIVE
Specific Gravity, Urine: 1.012
pH: 7.5

## 2011-02-07 LAB — COMPREHENSIVE METABOLIC PANEL
Albumin: 3.9
Alkaline Phosphatase: 91
BUN: 7
Creatinine, Ser: 0.65
Glucose, Bld: 137 — ABNORMAL HIGH
Potassium: 3.4 — ABNORMAL LOW
Total Bilirubin: 0.3
Total Protein: 7.4

## 2011-02-07 LAB — I-STAT 8, (EC8 V) (CONVERTED LAB)
Acid-Base Excess: 2
BUN: 9
Bicarbonate: 27 — ABNORMAL HIGH
Chloride: 108
Glucose, Bld: 138 — ABNORMAL HIGH
HCT: 39
Hemoglobin: 13.3
Operator id: 196461
Potassium: 3.4 — ABNORMAL LOW
Sodium: 141
TCO2: 28
pCO2, Ven: 42.4 — ABNORMAL LOW
pH, Ven: 7.412 — ABNORMAL HIGH

## 2011-02-07 LAB — ETHANOL: Alcohol, Ethyl (B): <5

## 2011-02-07 LAB — LIPASE, BLOOD: Lipase: 20

## 2011-02-07 LAB — POCT I-STAT CREATININE
Creatinine, Ser: 0.7
Operator id: 196461

## 2011-02-07 LAB — RAPID URINE DRUG SCREEN, HOSP PERFORMED: Barbiturates: NOT DETECTED

## 2011-02-13 ENCOUNTER — Telehealth: Payer: Self-pay

## 2011-02-13 NOTE — Telephone Encounter (Signed)
[name] informed via VM

## 2011-02-13 NOTE — Telephone Encounter (Signed)
k

## 2011-02-13 NOTE — Telephone Encounter (Signed)
[facility] physical therapist called requesting to extend her HHPT to 2/week for 2 week for balance, safety and ambulation.

## 2011-02-27 ENCOUNTER — Other Ambulatory Visit: Payer: Self-pay | Admitting: *Deleted

## 2011-02-27 MED ORDER — MELOXICAM 15 MG PO TABS: 15.0000 mg | ORAL_TABLET | Freq: Every day | ORAL | Status: DC

## 2011-02-27 MED ORDER — FUROSEMIDE 20 MG PO TABS: 20.0000 mg | ORAL_TABLET | Freq: Every evening | ORAL | Status: DC

## 2011-03-05 ENCOUNTER — Other Ambulatory Visit: Payer: Self-pay | Admitting: Internal Medicine

## 2011-03-06 ENCOUNTER — Telehealth: Payer: Self-pay | Admitting: *Deleted

## 2011-03-06 NOTE — Telephone Encounter (Signed)
Received forms from [facility] for diabetic shoes. I called pt to inform her that she would need an appointment for Dr Debby Bud to fill out these forms appropriately. Pt states she will call back Monday for an appointment.   Forms put above my desk in upcoming appointment folder     Spoke with pt and she states she cannot make an appointment at this time. She states she will call back after the holidays to have face to face ecounter for her diabetic shoes

## 2011-03-19 ENCOUNTER — Other Ambulatory Visit: Payer: Self-pay | Admitting: Internal Medicine

## 2011-03-20 ENCOUNTER — Other Ambulatory Visit: Payer: Self-pay | Admitting: Internal Medicine

## 2011-03-21 ENCOUNTER — Telehealth: Payer: Self-pay | Admitting: Internal Medicine

## 2011-03-21 MED ORDER — INSULIN GLARGINE 100 UNIT/ML ~~LOC~~ SOLN: 15.0000 [IU] | Freq: Every day | SUBCUTANEOUS | Status: DC

## 2011-03-21 MED ORDER — INSULIN PEN NEEDLE 30G X 8 MM MISC: 1.0000 | Status: DC | PRN

## 2011-03-21 MED ORDER — FUROSEMIDE 40 MG PO TABS: 20.0000 mg | ORAL_TABLET | Freq: Every day | ORAL | Status: DC

## 2011-03-21 NOTE — Telephone Encounter (Signed)
Faxed Rx to pharmacy for 30g needles.

## 2011-03-21 NOTE — Telephone Encounter (Signed)
Faxed Rx to pharmacy for Lantus.

## 2011-03-21 NOTE — Telephone Encounter (Signed)
Faxed Rx to pharmacy for furosemide.

## 2011-03-29 ENCOUNTER — Ambulatory Visit: Payer: Medicare Other | Admitting: Internal Medicine

## 2011-04-01 ENCOUNTER — Ambulatory Visit (INDEPENDENT_AMBULATORY_CARE_PROVIDER_SITE_OTHER): Payer: Medicare Other | Admitting: Internal Medicine

## 2011-04-01 ENCOUNTER — Ambulatory Visit: Payer: Medicare Other | Admitting: Internal Medicine

## 2011-04-01 ENCOUNTER — Encounter: Payer: Self-pay | Admitting: Internal Medicine

## 2011-04-01 VITALS — BP 130/82 | HR 109 | Temp 98.1°F

## 2011-04-01 DIAGNOSIS — J209 Acute bronchitis, unspecified: Secondary | ICD-10-CM

## 2011-04-01 DIAGNOSIS — E119 Type 2 diabetes mellitus without complications: Secondary | ICD-10-CM

## 2011-04-01 DIAGNOSIS — J069 Acute upper respiratory infection, unspecified: Secondary | ICD-10-CM

## 2011-04-01 MED ORDER — HYDROCODONE-HOMATROPINE 5-1.5 MG/5ML PO SYRP: 5.0000 mL | ORAL_SOLUTION | Freq: Four times a day (QID) | ORAL | Status: AC | PRN

## 2011-04-01 MED ORDER — IPRATROPIUM BROMIDE 0.03 % NA SOLN: 2.0000 | Freq: Three times a day (TID) | NASAL | Status: DC

## 2011-04-01 MED ORDER — METHYLPREDNISOLONE ACETATE 80 MG/ML IJ SUSP
80.0000 mg | Freq: Once | INTRAMUSCULAR | Status: AC
  Administered 2011-04-01: 80 mg via INTRAMUSCULAR

## 2011-04-01 NOTE — Patient Instructions (Signed)
It was good to see you today. If you develop worsening symptoms or fever, call and we can reconsider antibiotics, but it does not appear necessary to use antibiotics at this time. Prescription cough syrup and nasal spray to help symptoms - Your prescription(s) have been submitted to your pharmacy. Please take as directed and contact our office if you believe you are having problem(s) with the medication(s). Medrol shot today for congestion and head and chest - Watch your sugars and call if over 200 in next few days - steroids can increase your sugars more than usual for next few days!

## 2011-04-01 NOTE — Progress Notes (Signed)
  Subjective:    HPI  complains of cold symptoms  Onset 3 week ago, wax/wane symptoms  associated with rhinorrhea, sneezing, sore throat, coughing and low grade fever initially, now resolved Also myalgias and mild-mod head +chest congestion No relief with OTC meds Precipitated by sick contacts  Past Medical History  Diagnosis Date  . Anemia, iron deficiency   . Anxiety   . CAD (coronary artery disease)   . Type II or unspecified type diabetes mellitus without mention of complication, not stated as uncontrolled   . GERD (gastroesophageal reflux disease)   . HTN (hypertension)   . Peripheral neuropathy   . Schizophrenia   . Hyperlipidemia   . Allergic rhinitis   . 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 Dementia   . IBS (irritable bowel syndrome)   . Syncope     EP study 1996 w/atrial tachycardia  . Esophageal motility disorder   . Renal insufficiency     Review of Systems Constitutional: No fever or night sweats, no unexpected weight change Pulmonary: No pleurisy or hemoptysis Cardiovascular: No chest pain or palpitations     Objective:   Physical Exam BP 130/82  Pulse 109  Temp(Src) 98.1 F (36.7 C) (Oral)  SpO2 99% GEN: nontoxic appearing, no audible head/chest congestion - friend at side HENT: NCAT, no sinus tenderness bilaterally, nares with clear discharge, oropharynx mild erythema, no exudate Eyes: Vision grossly intact, no conjunctivitis Lungs: Clear to auscultation but few exp wheeze, no rhonchi and no increased work of breathing Cardiovascular: Regular rate and rhythm, no bilateral edema      Assessment & Plan:  Viral URI  Cough, postnasal drip related to above DM2 - expect slight exac following medrol for next 3-5 days   Explained lack of efficacy for antibiotics in viral disease Prescription cough suppression + atrovent nasal spray - new prescriptions done IM medrol for bronchospasm Symptomatic care with Tylenol  or Advil, hydration and rest -  salt gargle advised as needed patient advised on anticipated increase in cbgs due to steroid effect for next several days - pt will call if cbg>200

## 2011-04-18 ENCOUNTER — Telehealth: Payer: Self-pay

## 2011-04-18 NOTE — Telephone Encounter (Signed)
Pt's daughter called requesting to switch pt's PCP from Dr Debby Bud to Dr Felicity Coyer, please advise.

## 2011-04-19 NOTE — Telephone Encounter (Signed)
OK with me. Good luck

## 2011-04-19 NOTE — Telephone Encounter (Signed)
No thank you

## 2011-04-22 NOTE — Telephone Encounter (Signed)
Pt's daughter advised.  

## 2011-04-25 ENCOUNTER — Telehealth: Payer: Self-pay | Admitting: Internal Medicine

## 2011-04-25 NOTE — Telephone Encounter (Signed)
Pt aware.

## 2011-04-25 NOTE — Telephone Encounter (Signed)
Message copied by COUSIN, Daiva Eves on Thu Apr 25, 2011  8:26 AM ------      Message from: Rene Paci A      Created: Wed Apr 24, 2011 12:57 PM      Regarding: RE: Shyane Joung DESIRE TO SWITCH  DOCTOR       I believe I have already previously declined this request -       if not, I decline - thanks      VAL      ----- Message -----         From: Domenica Reamer         Sent: 04/24/2011  10:42 AM           To: Rene Paci, MD, #      Subject: Jaquita Rector DESIRE TO SWITCH  DOCTOR                  Dr Felicity Coyer and Dr Camillo Flaming desire to switch from Dr Debby Bud to Dr Felicity Coyer because she say she has to wait so long in the waiting room when she have an appt with Dr Debby Bud.  Thank you both for your reply

## 2011-04-25 NOTE — Telephone Encounter (Signed)
Message copied by COUSIN, Daiva Eves on Thu Apr 25, 2011  8:27 AM ------      Message from: Illene Regulus E      Created: Wed Apr 24, 2011 10:41 PM      Regarding: RE: Joene Husk--DR LESCHBER DECLINED HER AS A PT       No. Clearly she is unhappy with my care and wants another doctor      ----- Message -----         From: Domenica Reamer         Sent: 04/24/2011   3:17 PM           To: Duke Salvia, MD      Subject: Silvano Bilis LESCHBER DECLINED HER AS #            Dr Debby Bud,  Dr Felicity Coyer declined pt---Will you keep her as your pt.  Thank you for your reply

## 2011-05-03 ENCOUNTER — Telehealth: Payer: Self-pay | Admitting: *Deleted

## 2011-05-03 NOTE — Telephone Encounter (Signed)
Refill request for Crestor 20mg . Furosemide 20mg .

## 2011-05-06 ENCOUNTER — Other Ambulatory Visit: Payer: Self-pay

## 2011-05-06 MED ORDER — ROSUVASTATIN CALCIUM 20 MG PO TABS: 20.0000 mg | ORAL_TABLET | Freq: Every day | ORAL | Status: DC

## 2011-05-07 ENCOUNTER — Telehealth: Payer: Self-pay | Admitting: *Deleted

## 2011-05-07 MED ORDER — POTASSIUM CHLORIDE ER 8 MEQ PO TBCR: 8.0000 meq | EXTENDED_RELEASE_TABLET | Freq: Every day | ORAL | Status: DC

## 2011-05-07 NOTE — Telephone Encounter (Signed)
Refill request potassium CL .

## 2011-05-09 ENCOUNTER — Telehealth: Payer: Self-pay | Admitting: *Deleted

## 2011-05-09 MED ORDER — FUROSEMIDE 20 MG PO TABS: 20.0000 mg | ORAL_TABLET | Freq: Every day | ORAL | Status: DC

## 2011-05-09 NOTE — Telephone Encounter (Signed)
Refill request  furosemide 20 mg

## 2011-05-13 ENCOUNTER — Other Ambulatory Visit: Payer: Self-pay | Admitting: Internal Medicine

## 2011-05-15 DIAGNOSIS — Z79899 Other long term (current) drug therapy: Secondary | ICD-10-CM | POA: Diagnosis not present

## 2011-05-30 ENCOUNTER — Telehealth: Payer: Self-pay | Admitting: Internal Medicine

## 2011-05-30 NOTE — Telephone Encounter (Signed)
Pts daughter has been notified that Adline Mango has declined pts req to est and that no other pcp is avail for pt to est with.

## 2011-05-30 NOTE — Telephone Encounter (Signed)
Message copied by Almyra Brace on Thu May 30, 2011 10:31 AM ------      Message from: Adline Mango B      Created: Thu May 30, 2011  9:19 AM      Regarding: RE: Req for new pt to est, that has mcr and medicaid       Her controlled medication regimen is very complex. I can not offer her the care she has been receiving. Therefore, I am declining the patient.        ----- Message -----         From: Almyra Brace         Sent: 05/29/2011   3:54 PM           To: Adline Mango, FNP      Subject: Req for new pt to est, that has mcr and medi#            I rcvd a call from [employer]. Her mother, Allice Garro, is needing a new doctor and I recommended you, but when I pulled up the pts chart, I saw a note that said that this pt was Dr Debby Bud pt and pt had req to change drs to Dr Felicity Coyer. Dr Debby Bud refused to take pt back and Dr Felicity Coyer declined pts req as well. I didn't realize all this, until after I had recommeded you. Apparently she is a problem pt, but has not been discharged from [place]. I wanted to send you this note to see if you would see her or not. I completely understand if you decline the request. Thanks. [phone].

## 2011-06-05 DIAGNOSIS — Z79899 Other long term (current) drug therapy: Secondary | ICD-10-CM | POA: Diagnosis not present

## 2011-06-06 DIAGNOSIS — K1370 Unspecified lesions of oral mucosa: Secondary | ICD-10-CM | POA: Diagnosis not present

## 2011-06-27 ENCOUNTER — Other Ambulatory Visit: Payer: Self-pay | Admitting: Internal Medicine

## 2011-07-03 ENCOUNTER — Other Ambulatory Visit: Payer: Self-pay | Admitting: Internal Medicine

## 2011-07-03 DIAGNOSIS — M545 Low back pain: Secondary | ICD-10-CM | POA: Diagnosis not present

## 2011-07-03 DIAGNOSIS — E7800 Pure hypercholesterolemia, unspecified: Secondary | ICD-10-CM | POA: Diagnosis not present

## 2011-07-03 DIAGNOSIS — IMO0001 Reserved for inherently not codable concepts without codable children: Secondary | ICD-10-CM | POA: Diagnosis not present

## 2011-07-03 DIAGNOSIS — I10 Essential (primary) hypertension: Secondary | ICD-10-CM | POA: Diagnosis not present

## 2011-07-03 DIAGNOSIS — R82998 Other abnormal findings in urine: Secondary | ICD-10-CM | POA: Diagnosis not present

## 2011-07-03 DIAGNOSIS — Z23 Encounter for immunization: Secondary | ICD-10-CM | POA: Diagnosis not present

## 2011-07-05 ENCOUNTER — Other Ambulatory Visit: Payer: Self-pay

## 2011-07-05 MED ORDER — FUROSEMIDE 40 MG PO TABS: 20.0000 mg | ORAL_TABLET | Freq: Every day | ORAL | Status: DC

## 2011-07-05 MED ORDER — FERROUS SULFATE 325 (65 FE) MG PO TBEC: 325.0000 mg | DELAYED_RELEASE_TABLET | Freq: Two times a day (BID) | ORAL | Status: DC

## 2011-07-15 ENCOUNTER — Other Ambulatory Visit: Payer: Self-pay | Admitting: Internal Medicine

## 2011-07-15 ENCOUNTER — Emergency Department (HOSPITAL_COMMUNITY)
Admission: EM | Admit: 2011-07-15 | Discharge: 2011-07-15 | Disposition: A | Discharge status: Home / Self Care | Payer: Medicare Other | Attending: Emergency Medicine | Admitting: Emergency Medicine

## 2011-07-15 ENCOUNTER — Other Ambulatory Visit: Payer: Self-pay

## 2011-07-15 ENCOUNTER — Encounter (HOSPITAL_COMMUNITY): Payer: Self-pay | Admitting: Emergency Medicine

## 2011-07-15 DIAGNOSIS — I2510 Atherosclerotic heart disease of native coronary artery without angina pectoris: Secondary | ICD-10-CM | POA: Insufficient documentation

## 2011-07-15 DIAGNOSIS — E119 Type 2 diabetes mellitus without complications: Secondary | ICD-10-CM | POA: Insufficient documentation

## 2011-07-15 DIAGNOSIS — Z79899 Other long term (current) drug therapy: Secondary | ICD-10-CM | POA: Insufficient documentation

## 2011-07-15 DIAGNOSIS — R42 Dizziness and giddiness: Secondary | ICD-10-CM | POA: Diagnosis not present

## 2011-07-15 DIAGNOSIS — I10 Essential (primary) hypertension: Secondary | ICD-10-CM | POA: Diagnosis not present

## 2011-07-15 DIAGNOSIS — Z794 Long term (current) use of insulin: Secondary | ICD-10-CM | POA: Insufficient documentation

## 2011-07-15 DIAGNOSIS — R002 Palpitations: Secondary | ICD-10-CM | POA: Diagnosis not present

## 2011-07-15 DIAGNOSIS — N390 Urinary tract infection, site not specified: Secondary | ICD-10-CM | POA: Diagnosis not present

## 2011-07-15 DIAGNOSIS — R112 Nausea with vomiting, unspecified: Secondary | ICD-10-CM | POA: Diagnosis not present

## 2011-07-15 DIAGNOSIS — R9431 Abnormal electrocardiogram [ECG] [EKG]: Secondary | ICD-10-CM | POA: Diagnosis not present

## 2011-07-15 LAB — CBC
HCT: 35.3 % — ABNORMAL LOW (ref 36.0–46.0)
Hemoglobin: 10.9 g/dL — ABNORMAL LOW (ref 12.0–15.0)
MCH: 30.7 pg (ref 26.0–34.0)
MCV: 99.4 fL (ref 78.0–100.0)
Platelets: 271 10*3/uL (ref 150–400)
RBC: 3.55 MIL/uL — ABNORMAL LOW (ref 3.87–5.11)
WBC: 15.1 10*3/uL — ABNORMAL HIGH (ref 4.0–10.5)

## 2011-07-15 LAB — URINE MICROSCOPIC-ADD ON

## 2011-07-15 LAB — BASIC METABOLIC PANEL
BUN: 27 mg/dL — ABNORMAL HIGH (ref 6–23)
CO2: 27 mEq/L (ref 19–32)
Calcium: 10.2 mg/dL (ref 8.4–10.5)
Chloride: 102 mEq/L (ref 96–112)
Creatinine, Ser: 1.16 mg/dL — ABNORMAL HIGH (ref 0.50–1.10)
Glucose, Bld: 119 mg/dL — ABNORMAL HIGH (ref 70–99)

## 2011-07-15 LAB — URINALYSIS, ROUTINE W REFLEX MICROSCOPIC
Glucose, UA: NEGATIVE mg/dL
Hgb urine dipstick: NEGATIVE
Protein, ur: NEGATIVE mg/dL
Specific Gravity, Urine: 1.017 (ref 1.005–1.030)

## 2011-07-15 LAB — GLUCOSE, CAPILLARY: Glucose-Capillary: 121 mg/dL — ABNORMAL HIGH (ref 70–99)

## 2011-07-15 MED ORDER — CEPHALEXIN 500 MG PO CAPS: 500.0000 mg | ORAL_CAPSULE | Freq: Four times a day (QID) | ORAL | Status: AC

## 2011-07-15 MED ORDER — ONDANSETRON HCL 4 MG/2ML IJ SOLN
4.0000 mg | Freq: Once | INTRAMUSCULAR | Status: AC
  Administered 2011-07-15: 4 mg via INTRAVENOUS
  Filled 2011-07-15: qty 2

## 2011-07-15 MED ORDER — ONDANSETRON 8 MG PO TBDP: 8.0000 mg | ORAL_TABLET | Freq: Three times a day (TID) | ORAL | Status: AC | PRN

## 2011-07-15 NOTE — ED Notes (Signed)
CBG RESULTS: 121

## 2011-07-15 NOTE — ED Notes (Signed)
Patient states daughter will pick up patient.

## 2011-07-15 NOTE — ED Notes (Signed)
Ethelle Lyon RN ACT Nurse outpatient psych nurse [phone].  States would be able to transport patient.

## 2011-07-15 NOTE — ED Provider Notes (Signed)
History     CSN: [id number]  Arrival date & time 07/15/11  1135   First MD Initiated Contact with Patient 07/15/11 1201      Chief Complaint  Patient presents with  . Palpitations      HPI history obtained from the patient and her nurse practitioner at her outpatient clinic  Patient had 2 days of nausea and vomiting with decreased oral intake.  She denies abdominal pain.  She has no dysuria or urinary frequency.  She has no flank pain.  She was taken to the primary care office today for evaluation and was noted to be in atrial fibrillation with a heart rate in the 110;s.  She was sent to the ER for evaluation.  She denies diarrhea.  She has no chest pain jaw pain or back pain.  She has no shortness of breath.  She reports she feels better at this time and would like something to drink.  I spoke with the patient's nurse practitioner who reports the patient has a known history of paroxysmal atrial fibrillation and is not on Coumadin.  At this time the patient is without complaint.  Nothing worsens her symptoms.  Nothing improves her symptoms.  Her symptoms are constant.   Past Medical History  Diagnosis Date  . Anemia, iron deficiency   . Anxiety   . CAD (coronary artery disease)   . Type II or unspecified type diabetes mellitus without mention of complication, not stated as uncontrolled   . GERD (gastroesophageal reflux disease)   . HTN (hypertension)   . Peripheral neuropathy   . Schizophrenia   . Hyperlipidemia   . Allergic rhinitis   . 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 Dementia   . IBS (irritable bowel syndrome)   . Syncope     EP study 1996 w/atrial tachycardia  . Esophageal motility disorder   . Renal insufficiency     Past Surgical History  Procedure Date  . Abdominal hysterectomy   . Ptca 1995  . Knee arthroplasty     Right   . Breast biopsy     Negative    Family History  Problem Relation Age of Onset  . Schizophrenia Neg  Hx   . Coronary artery disease Neg Hx   . Colon cancer Neg Hx   . Breast cancer Neg Hx   . Hypertension Other   . Diabetes Other     History  Substance Use Topics  . Smoking status: Former Games developer  . Smokeless tobacco: Never Used  . Alcohol Use: No    OB History    Grav Para Term Preterm Abortions TAB SAB Ect Mult Living                  Review of Systems  All other systems reviewed and are negative.    Allergies  Codeine phosphate; Glimepiride; Glyburide; and Tramadol  Home Medications   Current Outpatient Rx  Name Route Sig Dispense Refill  . ACETAMINOPHEN 500 MG PO TABS Oral Take 500-1,000 mg by mouth 3 (three) times daily as needed. For back pain     . AMITRIPTYLINE HCL 25 MG PO TABS Oral Take 1 tablet (25 mg total) by mouth at bedtime. 30 tablet 11  . AMLODIPINE BESYLATE 10 MG PO TABS Oral Take 10 mg by mouth daily.    . ASPIRIN 325 MG PO TABS Oral Take 325 mg by mouth daily.      Marland Kitchen  BENZTROPINE MESYLATE 0.5 MG PO TABS Oral Take 0.5 mg by mouth daily.      . CHOLECALCIFEROL 1000 UNITS PO TABS Oral Take 1,000 Units by mouth daily.      Marland Kitchen CLONAZEPAM 1 MG PO TABS Oral Take 1 mg by mouth at bedtime as needed.     Marland Kitchen CLOZAPINE 100 MG PO TABS Oral Take 200 mg by mouth at bedtime.     . DONEPEZIL HCL 10 MG PO TABS Oral Take 10 mg by mouth at bedtime.    Marland Kitchen FERROUS SULFATE 325 (65 FE) MG PO TBEC Oral Take 1 tablet (325 mg total) by mouth 2 (two) times daily. 60 tablet 5  . FUROSEMIDE 40 MG PO TABS Oral Take 0.5 tablets (20 mg total) by mouth daily. 90 tablet 1  . GLUCOSE BLOOD VI STRP  Use as instructed 100 each 12  . HALOPERIDOL 10 MG PO TABS Oral Take 10 mg by mouth at bedtime.      . INSULIN GLARGINE 100 UNIT/ML [place] SOLN Subcutaneous Inject 15 Units into the skin at bedtime. 15 mL 6  . INSULIN ISOPHANE & REGULAR (70-30) 100 UNIT/ML [place] SUSP Subcutaneous Inject 25 Units into the skin 2 (two) times daily with a meal.    . INSULIN PEN NEEDLE 30G X 8 MM MISC Subcutaneous Inject  10 each into the skin as needed. 100 each 2  . IPRATROPIUM BROMIDE 0.03 % NA SOLN Nasal Place 2 sprays into the nose 3 (three) times daily. 30 mL 0  . MELOXICAM 15 MG PO TABS Oral Take 15 mg by mouth daily.    Marland Kitchen MEMANTINE HCL 10 MG PO TABS Oral Take 10 mg by mouth 2 (two) times daily.    . SUPER HIGH VITAMINS/MINERALS PO TABS Oral Take 1 tablet by mouth daily.      Marland Kitchen OMEPRAZOLE 20 MG PO CPDR Oral Take 2 capsules (40 mg total) by mouth every morning. 30 capsule 11  . PIOGLITAZONE HCL 30 MG PO TABS Oral Take 30 mg by mouth daily.    Marland Kitchen POTASSIUM CHLORIDE ER 8 MEQ PO TBCR Oral Take 1 tablet (8 mEq total) by mouth daily. 30 tablet 6  . RAMIPRIL 10 MG PO CAPS Oral Take 1 capsule (10 mg total) by mouth daily. 30 capsule 11  . ROSUVASTATIN CALCIUM 20 MG PO TABS Oral Take 20 mg by mouth at bedtime.      BP 147/80  Pulse 94  Temp(Src) 97.8 F (36.6 C) (Oral)  Resp 16  SpO2 96%  Physical Exam  Nursing note and vitals reviewed. Constitutional: She is oriented to person, place, and time. She appears well-developed and well-nourished. No distress.  HENT:  Head: Normocephalic and atraumatic.  Eyes: EOM are normal.  Neck: Normal range of motion.  Cardiovascular: Normal rate, regular rhythm and normal heart sounds.   Pulmonary/Chest: Effort normal and breath sounds normal.  Abdominal: Soft. She exhibits no distension. There is no tenderness.  Musculoskeletal: Normal range of motion.  Neurological: She is alert and oriented to person, place, and time.  Skin: Skin is warm and dry.  Psychiatric: She has a normal mood and affect. Judgment normal.    ED Course  Procedures (including critical care time)   Date: 07/15/2011  Rate: 96  Rhythm: normal sinus rhythm  QRS Axis: normal  Intervals: normal  ST/T Wave abnormalities: normal  Conduction Disutrbances: none  Narrative Interpretation:   Old EKG Reviewed: No significant changes noted     Labs Reviewed  GLUCOSE, CAPILLARY - Abnormal;  Notable for the following:    Glucose-Capillary 121 (*)    All other components within normal limits  CBC - Abnormal; Notable for the following:    WBC 15.1 (*)    RBC 3.55 (*)    Hemoglobin 10.9 (*)    HCT 35.3 (*)    All other components within normal limits  BASIC METABOLIC PANEL - Abnormal; Notable for the following:    Glucose, Bld 119 (*)    BUN 27 (*)    Creatinine, Ser 1.16 (*)    GFR calc non Af Amer 47 (*)    GFR calc Af Amer 54 (*)    All other components within normal limits  TROPONIN I  PROTIME-INR   No results found.   1. Nausea and vomiting   2. Palpitations       MDM  Patient feels much better at this time.  She is tolerating oral fluids in the ER without difficulty.  I suspect the majority of her nausea and vomiting is viral.  She's a normal sinus rhythm here and I do not have the EKG that was obtained at her clinic to confirm that this was true atrial fibrillation with a rate of 110.  She is on Coumadin.  According to my chart here in the ER she does not have a history of atrial fibrillation the patient is not sure she has a history.  DC home with PCP followup.        Lyanne Co, MD 07/15/11 [phone]

## 2011-07-15 NOTE — ED Notes (Signed)
Onset dizziness 3 days ago with a fall no trauma.  Told home RN not to take her to ED.  Appointment made for Primary Doctor today who seen patient and sent to ED for evaluation of EKG abnormality.  Patient states nausea and vomiting past 2 days and did not eat or drink anything today.  Alert answering and following commands appropriate.

## 2011-07-15 NOTE — ED Notes (Signed)
Patient alert answering and following commands appropriate. Nausea and emesis for 2 days and did not eat today.  Airway intact bilateral equal chest rise and fall. Lives alone in apartment with ACT RN visiting intermittent.  Resting comfortably on stretcher.  Currently denies any pain.

## 2011-07-15 NOTE — ED Notes (Signed)
Patient states diagnosis of UTI one week ago and has dysuria burning sensation.

## 2011-07-16 ENCOUNTER — Other Ambulatory Visit: Payer: Self-pay | Admitting: Internal Medicine

## 2011-07-17 NOTE — Telephone Encounter (Signed)
Denied-escript 03.25.13

## 2011-07-22 DIAGNOSIS — Z5181 Encounter for therapeutic drug level monitoring: Secondary | ICD-10-CM | POA: Diagnosis not present

## 2011-07-22 DIAGNOSIS — Z79899 Other long term (current) drug therapy: Secondary | ICD-10-CM | POA: Diagnosis not present

## 2011-07-26 ENCOUNTER — Other Ambulatory Visit: Payer: Self-pay

## 2011-07-26 ENCOUNTER — Other Ambulatory Visit: Payer: Self-pay | Admitting: *Deleted

## 2011-07-26 MED ORDER — GABAPENTIN 600 MG PO TABS: 600.0000 mg | ORAL_TABLET | Freq: Three times a day (TID) | ORAL | Status: DC

## 2011-07-26 MED ORDER — INSULIN PEN NEEDLE 30G X 8 MM MISC: 1.0000 | Status: DC | PRN

## 2011-07-26 NOTE — Telephone Encounter (Signed)
R'cd fax from [facility] for refill of Gabapentin

## 2011-08-06 ENCOUNTER — Ambulatory Visit
Admission: RE | Admit: 2011-08-06 | Discharge: 2011-08-06 | Disposition: A | Discharge status: Home / Self Care | Payer: Medicare Other | Source: Ambulatory Visit | Attending: Obstetrics and Gynecology | Admitting: Obstetrics and Gynecology

## 2011-08-06 DIAGNOSIS — Z1231 Encounter for screening mammogram for malignant neoplasm of breast: Secondary | ICD-10-CM | POA: Diagnosis not present

## 2011-08-06 DIAGNOSIS — N390 Urinary tract infection, site not specified: Secondary | ICD-10-CM | POA: Diagnosis not present

## 2011-08-06 DIAGNOSIS — R413 Other amnesia: Secondary | ICD-10-CM | POA: Diagnosis not present

## 2011-08-15 ENCOUNTER — Telehealth: Payer: Self-pay | Admitting: Obstetrics and Gynecology

## 2011-08-20 ENCOUNTER — Other Ambulatory Visit: Payer: Self-pay

## 2011-08-20 NOTE — Telephone Encounter (Signed)
I thought the patient's daughter fired me.

## 2011-08-20 NOTE — Telephone Encounter (Signed)
Per notation in chart; Is this patient still under the care of Dr Debby Bud?

## 2011-08-21 MED ORDER — AMLODIPINE BESYLATE 10 MG PO TABS: 10.0000 mg | ORAL_TABLET | Freq: Every day | ORAL | Status: DC

## 2011-08-21 MED ORDER — MEMANTINE HCL 10 MG PO TABS: 10.0000 mg | ORAL_TABLET | Freq: Two times a day (BID) | ORAL | Status: DC

## 2011-08-21 MED ORDER — ROSUVASTATIN CALCIUM 20 MG PO TABS: 20.0000 mg | ORAL_TABLET | Freq: Every day | ORAL | Status: DC

## 2011-08-21 MED ORDER — MELOXICAM 15 MG PO TABS: 15.0000 mg | ORAL_TABLET | Freq: Every day | ORAL | Status: DC

## 2011-08-27 ENCOUNTER — Telehealth: Payer: Self-pay | Admitting: *Deleted

## 2011-08-27 NOTE — Telephone Encounter (Signed)
No longer her doctor - I was fired

## 2011-08-27 NOTE — Telephone Encounter (Signed)
Faxed back paper request from pharmacy with MD response.

## 2011-08-27 NOTE — Telephone Encounter (Signed)
Pharmacy question asking is patient currently on this script furosemide 40mg  . Saw your note on 08/20/2011. Was unsure to respond to pharmacy. Advise ,Fannie Knee .

## 2011-08-28 DIAGNOSIS — Z79899 Other long term (current) drug therapy: Secondary | ICD-10-CM | POA: Diagnosis not present

## 2011-09-03 ENCOUNTER — Ambulatory Visit: Payer: Self-pay | Admitting: Obstetrics and Gynecology

## 2011-09-20 ENCOUNTER — Other Ambulatory Visit: Payer: Self-pay | Admitting: *Deleted

## 2011-09-20 NOTE — Telephone Encounter (Signed)
Pharmacy fax for refill on med gabapentin refused. Faxed back to pharmacy that Dr. Debby Bud no longer her MD

## 2011-09-24 ENCOUNTER — Encounter: Payer: Self-pay | Admitting: Obstetrics and Gynecology

## 2011-09-25 DIAGNOSIS — Z79899 Other long term (current) drug therapy: Secondary | ICD-10-CM | POA: Diagnosis not present

## 2011-10-01 ENCOUNTER — Telehealth: Payer: Self-pay | Admitting: Obstetrics and Gynecology

## 2011-10-01 NOTE — Telephone Encounter (Signed)
Triage/elect. 

## 2011-10-01 NOTE — Telephone Encounter (Signed)
Triage/already cld

## 2011-10-02 ENCOUNTER — Telehealth: Payer: Self-pay | Admitting: Obstetrics and Gynecology

## 2011-10-02 NOTE — Telephone Encounter (Signed)
latoya/epic

## 2011-10-02 NOTE — Telephone Encounter (Signed)
Can you call pt?

## 2011-10-02 NOTE — Telephone Encounter (Signed)
Pt ws called back and made aware of Mamm[facility]. Pt confirmed 10-16-2011 @ 4:15pm LC CMA

## 2011-10-11 ENCOUNTER — Other Ambulatory Visit: Payer: Self-pay

## 2011-10-11 DIAGNOSIS — IMO0001 Reserved for inherently not codable concepts without codable children: Secondary | ICD-10-CM | POA: Diagnosis not present

## 2011-10-11 DIAGNOSIS — N390 Urinary tract infection, site not specified: Secondary | ICD-10-CM | POA: Diagnosis not present

## 2011-10-11 DIAGNOSIS — I10 Essential (primary) hypertension: Secondary | ICD-10-CM | POA: Diagnosis not present

## 2011-10-11 DIAGNOSIS — Z79899 Other long term (current) drug therapy: Secondary | ICD-10-CM | POA: Diagnosis not present

## 2011-10-11 MED ORDER — MELOXICAM 15 MG PO TABS: 15.0000 mg | ORAL_TABLET | Freq: Every day | ORAL | Status: DC

## 2011-10-11 MED ORDER — AMLODIPINE BESYLATE 10 MG PO TABS: 10.0000 mg | ORAL_TABLET | Freq: Every day | ORAL | Status: DC

## 2011-10-11 MED ORDER — MEMANTINE HCL 10 MG PO TABS: 10.0000 mg | ORAL_TABLET | Freq: Two times a day (BID) | ORAL | Status: DC

## 2011-10-11 MED ORDER — GABAPENTIN 600 MG PO TABS: 600.0000 mg | ORAL_TABLET | Freq: Three times a day (TID) | ORAL | Status: DC

## 2011-10-11 MED ORDER — ROSUVASTATIN CALCIUM 20 MG PO TABS: 20.0000 mg | ORAL_TABLET | Freq: Every day | ORAL | Status: DC

## 2011-10-21 ENCOUNTER — Other Ambulatory Visit: Payer: Self-pay | Admitting: *Deleted

## 2011-10-21 MED ORDER — POTASSIUM CHLORIDE ER 8 MEQ PO TBCR: 8.0000 meq | EXTENDED_RELEASE_TABLET | Freq: Every day | ORAL | Status: DC

## 2011-10-21 NOTE — Telephone Encounter (Signed)
Refill on potassium cl sent to [facility]

## 2011-10-23 DIAGNOSIS — Z79899 Other long term (current) drug therapy: Secondary | ICD-10-CM | POA: Diagnosis not present

## 2011-11-04 ENCOUNTER — Other Ambulatory Visit: Payer: Self-pay | Admitting: Internal Medicine

## 2011-11-05 ENCOUNTER — Other Ambulatory Visit: Payer: Self-pay | Admitting: *Deleted

## 2011-11-05 MED ORDER — DONEPEZIL HCL 10 MG PO TABS: 10.0000 mg | ORAL_TABLET | Freq: Every evening | ORAL | Status: DC | PRN

## 2011-11-05 NOTE — Telephone Encounter (Signed)
aricept refill to [facility]

## 2011-11-11 ENCOUNTER — Other Ambulatory Visit: Payer: Self-pay | Admitting: Internal Medicine

## 2011-11-14 ENCOUNTER — Other Ambulatory Visit: Payer: Self-pay | Admitting: Internal Medicine

## 2011-11-15 NOTE — Telephone Encounter (Signed)
Patient request refill on meds. , per notation in chart 07/2011 you thought daughter had fired you. Last OV with you 01/14/2011. Please advise if to refill meds for patient

## 2011-11-20 DIAGNOSIS — Z79899 Other long term (current) drug therapy: Secondary | ICD-10-CM | POA: Diagnosis not present

## 2011-11-22 ENCOUNTER — Other Ambulatory Visit: Payer: Self-pay | Admitting: Internal Medicine

## 2011-12-19 ENCOUNTER — Other Ambulatory Visit: Payer: Self-pay | Admitting: Orthopedic Surgery

## 2011-12-19 DIAGNOSIS — M4800 Spinal stenosis, site unspecified: Secondary | ICD-10-CM

## 2011-12-19 DIAGNOSIS — M25559 Pain in unspecified hip: Secondary | ICD-10-CM | POA: Diagnosis not present

## 2011-12-26 ENCOUNTER — Other Ambulatory Visit: Payer: Medicare Other

## 2012-01-02 ENCOUNTER — Other Ambulatory Visit: Payer: Medicare Other

## 2012-01-02 ENCOUNTER — Ambulatory Visit
Admission: RE | Admit: 2012-01-02 | Discharge: 2012-01-02 | Disposition: A | Discharge status: Home / Self Care | Payer: Medicare Other | Source: Ambulatory Visit | Attending: Orthopedic Surgery | Admitting: Orthopedic Surgery

## 2012-01-02 DIAGNOSIS — M48061 Spinal stenosis, lumbar region without neurogenic claudication: Secondary | ICD-10-CM | POA: Diagnosis not present

## 2012-01-02 DIAGNOSIS — M4800 Spinal stenosis, site unspecified: Secondary | ICD-10-CM

## 2012-01-09 DIAGNOSIS — H2510 Age-related nuclear cataract, unspecified eye: Secondary | ICD-10-CM | POA: Diagnosis not present

## 2012-01-09 DIAGNOSIS — E1139 Type 2 diabetes mellitus with other diabetic ophthalmic complication: Secondary | ICD-10-CM | POA: Diagnosis not present

## 2012-01-14 DIAGNOSIS — Z79899 Other long term (current) drug therapy: Secondary | ICD-10-CM | POA: Diagnosis not present

## 2012-01-16 ENCOUNTER — Other Ambulatory Visit: Payer: Self-pay | Admitting: *Deleted

## 2012-01-16 MED ORDER — GLUCOSE BLOOD VI STRP: ORAL_STRIP | Status: DC

## 2012-02-04 DIAGNOSIS — M545 Low back pain: Secondary | ICD-10-CM | POA: Diagnosis not present

## 2012-02-04 DIAGNOSIS — M25559 Pain in unspecified hip: Secondary | ICD-10-CM | POA: Diagnosis not present

## 2012-02-17 ENCOUNTER — Other Ambulatory Visit: Payer: Self-pay | Admitting: *Deleted

## 2012-02-17 MED ORDER — FERROUS SULFATE 325 (65 FE) MG PO TBEC: 325.0000 mg | DELAYED_RELEASE_TABLET | Freq: Two times a day (BID) | ORAL | Status: DC

## 2012-02-17 MED ORDER — RAMIPRIL 10 MG PO CAPS: 10.0000 mg | ORAL_CAPSULE | Freq: Every day | ORAL | Status: DC

## 2012-02-21 DIAGNOSIS — F200 Paranoid schizophrenia: Secondary | ICD-10-CM | POA: Diagnosis not present

## 2012-02-21 DIAGNOSIS — Z79899 Other long term (current) drug therapy: Secondary | ICD-10-CM | POA: Diagnosis not present

## 2012-03-03 ENCOUNTER — Other Ambulatory Visit: Payer: Self-pay | Admitting: *Deleted

## 2012-03-09 ENCOUNTER — Other Ambulatory Visit: Payer: Self-pay | Admitting: Internal Medicine

## 2012-03-23 ENCOUNTER — Other Ambulatory Visit: Payer: Self-pay | Admitting: *Deleted

## 2012-03-23 MED ORDER — RAMIPRIL 10 MG PO CAPS: 10.0000 mg | ORAL_CAPSULE | Freq: Every day | ORAL | Status: DC

## 2012-04-01 ENCOUNTER — Other Ambulatory Visit: Payer: Self-pay | Admitting: *Deleted

## 2012-04-01 MED ORDER — PIOGLITAZONE HCL 30 MG PO TABS: 30.0000 mg | ORAL_TABLET | Freq: Every day | ORAL | Status: DC

## 2012-04-03 ENCOUNTER — Other Ambulatory Visit: Payer: Self-pay | Admitting: Internal Medicine

## 2012-04-03 DIAGNOSIS — F200 Paranoid schizophrenia: Secondary | ICD-10-CM | POA: Diagnosis not present

## 2012-04-03 DIAGNOSIS — Z79899 Other long term (current) drug therapy: Secondary | ICD-10-CM | POA: Diagnosis not present

## 2012-04-03 NOTE — Telephone Encounter (Signed)
Pt no longer under prescriber care

## 2012-04-24 ENCOUNTER — Other Ambulatory Visit: Payer: Self-pay | Admitting: Internal Medicine

## 2012-04-28 ENCOUNTER — Other Ambulatory Visit: Payer: Self-pay | Admitting: *Deleted

## 2012-04-28 MED ORDER — AMLODIPINE BESYLATE 10 MG PO TABS: 10.0000 mg | ORAL_TABLET | Freq: Every day | ORAL | Status: DC

## 2012-04-28 MED ORDER — GABAPENTIN 600 MG PO TABS: 600.0000 mg | ORAL_TABLET | Freq: Three times a day (TID) | ORAL | Status: DC

## 2012-04-28 MED ORDER — DONEPEZIL HCL 10 MG PO TABS: 10.0000 mg | ORAL_TABLET | Freq: Every evening | ORAL | Status: DC | PRN

## 2012-04-28 MED ORDER — POTASSIUM CHLORIDE ER 8 MEQ PO TBCR: 8.0000 meq | EXTENDED_RELEASE_TABLET | Freq: Every day | ORAL | Status: DC

## 2012-05-04 ENCOUNTER — Other Ambulatory Visit: Payer: Self-pay | Admitting: *Deleted

## 2012-05-04 MED ORDER — FUROSEMIDE 40 MG PO TABS: 20.0000 mg | ORAL_TABLET | Freq: Every day | ORAL | Status: DC

## 2012-05-06 ENCOUNTER — Other Ambulatory Visit: Payer: Self-pay | Admitting: *Deleted

## 2012-05-06 MED ORDER — DONEPEZIL HCL 10 MG PO TABS: 10.0000 mg | ORAL_TABLET | Freq: Every evening | ORAL | Status: DC | PRN

## 2012-05-21 ENCOUNTER — Other Ambulatory Visit: Payer: Self-pay | Admitting: *Deleted

## 2012-05-21 MED ORDER — MEMANTINE HCL 10 MG PO TABS: 10.0000 mg | ORAL_TABLET | Freq: Two times a day (BID) | ORAL | Status: DC

## 2012-05-21 MED ORDER — RAMIPRIL 10 MG PO CAPS: 10.0000 mg | ORAL_CAPSULE | Freq: Every day | ORAL | Status: DC

## 2012-05-21 MED ORDER — FERROUS SULFATE 325 (65 FE) MG PO TBEC: 325.0000 mg | DELAYED_RELEASE_TABLET | Freq: Every day | ORAL | Status: DC

## 2012-05-21 MED ORDER — PIOGLITAZONE HCL 30 MG PO TABS: 30.0000 mg | ORAL_TABLET | Freq: Every day | ORAL | Status: DC

## 2012-05-21 MED ORDER — ROSUVASTATIN CALCIUM 20 MG PO TABS: 20.0000 mg | ORAL_TABLET | Freq: Every day | ORAL | Status: DC

## 2012-05-21 MED ORDER — MELOXICAM 15 MG PO TABS: 15.0000 mg | ORAL_TABLET | Freq: Every day | ORAL | Status: DC

## 2012-05-22 DIAGNOSIS — Z79899 Other long term (current) drug therapy: Secondary | ICD-10-CM | POA: Diagnosis not present

## 2012-05-22 DIAGNOSIS — F200 Paranoid schizophrenia: Secondary | ICD-10-CM | POA: Diagnosis not present

## 2012-06-18 DIAGNOSIS — Z79899 Other long term (current) drug therapy: Secondary | ICD-10-CM | POA: Diagnosis not present

## 2012-06-24 DIAGNOSIS — IMO0001 Reserved for inherently not codable concepts without codable children: Secondary | ICD-10-CM | POA: Diagnosis not present

## 2012-07-14 ENCOUNTER — Other Ambulatory Visit: Payer: Self-pay

## 2012-07-14 MED ORDER — PIOGLITAZONE HCL 30 MG PO TABS: 30.0000 mg | ORAL_TABLET | Freq: Every day | ORAL | Status: DC

## 2012-07-14 NOTE — Telephone Encounter (Signed)
rx for pioglitzaone 30 mg sent to pharmacy

## 2012-07-17 DIAGNOSIS — Z79899 Other long term (current) drug therapy: Secondary | ICD-10-CM | POA: Diagnosis not present

## 2012-07-28 ENCOUNTER — Other Ambulatory Visit: Payer: Self-pay

## 2012-07-28 MED ORDER — FERROUS SULFATE 325 (65 FE) MG PO TBEC: 325.0000 mg | DELAYED_RELEASE_TABLET | Freq: Every day | ORAL | Status: DC

## 2012-07-28 MED ORDER — MEMANTINE HCL 10 MG PO TABS: 10.0000 mg | ORAL_TABLET | Freq: Two times a day (BID) | ORAL | Status: DC

## 2012-07-28 MED ORDER — ROSUVASTATIN CALCIUM 20 MG PO TABS: 20.0000 mg | ORAL_TABLET | Freq: Every day | ORAL | Status: DC

## 2012-07-28 MED ORDER — RAMIPRIL 10 MG PO CAPS: 10.0000 mg | ORAL_CAPSULE | Freq: Every day | ORAL | Status: DC

## 2012-07-28 NOTE — Telephone Encounter (Signed)
[facility] also sent a refill request for Nexium 20 mg, daily. I do not see this on pt's med list. Is this something you want her started on? Please advise.

## 2012-07-28 NOTE — Telephone Encounter (Signed)
No longer my patient.

## 2012-07-30 ENCOUNTER — Other Ambulatory Visit: Payer: Self-pay | Admitting: Internal Medicine

## 2012-08-05 ENCOUNTER — Other Ambulatory Visit: Payer: Self-pay | Admitting: Internal Medicine

## 2012-08-17 DIAGNOSIS — Z79899 Other long term (current) drug therapy: Secondary | ICD-10-CM | POA: Diagnosis not present

## 2012-08-17 DIAGNOSIS — F200 Paranoid schizophrenia: Secondary | ICD-10-CM | POA: Diagnosis not present

## 2012-08-21 DIAGNOSIS — R42 Dizziness and giddiness: Secondary | ICD-10-CM | POA: Diagnosis not present

## 2012-08-21 DIAGNOSIS — I10 Essential (primary) hypertension: Secondary | ICD-10-CM | POA: Diagnosis not present

## 2012-08-21 DIAGNOSIS — R000 Tachycardia, unspecified: Secondary | ICD-10-CM | POA: Diagnosis not present

## 2012-08-27 DIAGNOSIS — I10 Essential (primary) hypertension: Secondary | ICD-10-CM | POA: Diagnosis not present

## 2012-08-27 DIAGNOSIS — J309 Allergic rhinitis, unspecified: Secondary | ICD-10-CM | POA: Diagnosis not present

## 2012-08-27 DIAGNOSIS — J019 Acute sinusitis, unspecified: Secondary | ICD-10-CM | POA: Diagnosis not present

## 2012-09-18 ENCOUNTER — Other Ambulatory Visit: Payer: Self-pay | Admitting: Internal Medicine

## 2012-09-18 DIAGNOSIS — Z79899 Other long term (current) drug therapy: Secondary | ICD-10-CM | POA: Diagnosis not present

## 2012-09-18 DIAGNOSIS — F200 Paranoid schizophrenia: Secondary | ICD-10-CM | POA: Diagnosis not present

## 2012-09-29 ENCOUNTER — Other Ambulatory Visit: Payer: Self-pay

## 2012-09-29 MED ORDER — INSULIN NPH ISOPHANE & REGULAR (70-30) 100 UNIT/ML ~~LOC~~ SUSP: 25.0000 [IU] | Freq: Two times a day (BID) | SUBCUTANEOUS | Status: DC

## 2012-10-09 ENCOUNTER — Other Ambulatory Visit: Payer: Self-pay

## 2012-10-09 MED ORDER — ROSUVASTATIN CALCIUM 20 MG PO TABS: 20.0000 mg | ORAL_TABLET | Freq: Every day | ORAL | Status: DC

## 2012-10-09 MED ORDER — RAMIPRIL 10 MG PO CAPS: 10.0000 mg | ORAL_CAPSULE | Freq: Every day | ORAL | Status: DC

## 2012-10-09 MED ORDER — MEMANTINE HCL 10 MG PO TABS: 10.0000 mg | ORAL_TABLET | Freq: Two times a day (BID) | ORAL | Status: DC

## 2012-10-09 MED ORDER — DONEPEZIL HCL 10 MG PO TABS: 10.0000 mg | ORAL_TABLET | Freq: Every evening | ORAL | Status: DC | PRN

## 2012-10-09 MED ORDER — AMLODIPINE BESYLATE 10 MG PO TABS: 10.0000 mg | ORAL_TABLET | Freq: Every day | ORAL | Status: DC

## 2012-10-16 DIAGNOSIS — Z79899 Other long term (current) drug therapy: Secondary | ICD-10-CM | POA: Diagnosis not present

## 2012-10-16 DIAGNOSIS — F200 Paranoid schizophrenia: Secondary | ICD-10-CM | POA: Diagnosis not present

## 2012-10-21 ENCOUNTER — Other Ambulatory Visit: Payer: Self-pay | Admitting: Internal Medicine

## 2012-11-04 DIAGNOSIS — J019 Acute sinusitis, unspecified: Secondary | ICD-10-CM | POA: Diagnosis not present

## 2012-11-04 DIAGNOSIS — J309 Allergic rhinitis, unspecified: Secondary | ICD-10-CM | POA: Diagnosis not present

## 2012-11-04 DIAGNOSIS — H1045 Other chronic allergic conjunctivitis: Secondary | ICD-10-CM | POA: Diagnosis not present

## 2012-11-19 DIAGNOSIS — Z79899 Other long term (current) drug therapy: Secondary | ICD-10-CM | POA: Diagnosis not present

## 2012-12-16 DIAGNOSIS — I10 Essential (primary) hypertension: Secondary | ICD-10-CM | POA: Diagnosis not present

## 2012-12-16 DIAGNOSIS — J310 Chronic rhinitis: Secondary | ICD-10-CM | POA: Diagnosis not present

## 2012-12-16 DIAGNOSIS — F0390 Unspecified dementia without behavioral disturbance: Secondary | ICD-10-CM | POA: Diagnosis not present

## 2012-12-16 DIAGNOSIS — E119 Type 2 diabetes mellitus without complications: Secondary | ICD-10-CM | POA: Diagnosis not present

## 2012-12-16 DIAGNOSIS — E785 Hyperlipidemia, unspecified: Secondary | ICD-10-CM | POA: Diagnosis not present

## 2012-12-16 DIAGNOSIS — K219 Gastro-esophageal reflux disease without esophagitis: Secondary | ICD-10-CM | POA: Diagnosis not present

## 2012-12-16 DIAGNOSIS — D649 Anemia, unspecified: Secondary | ICD-10-CM | POA: Diagnosis not present

## 2012-12-16 DIAGNOSIS — F209 Schizophrenia, unspecified: Secondary | ICD-10-CM | POA: Diagnosis not present

## 2012-12-17 DIAGNOSIS — E119 Type 2 diabetes mellitus without complications: Secondary | ICD-10-CM | POA: Diagnosis not present

## 2012-12-18 DIAGNOSIS — F339 Major depressive disorder, recurrent, unspecified: Secondary | ICD-10-CM | POA: Diagnosis not present

## 2012-12-18 DIAGNOSIS — Z79899 Other long term (current) drug therapy: Secondary | ICD-10-CM | POA: Diagnosis not present

## 2013-01-18 DIAGNOSIS — Z79899 Other long term (current) drug therapy: Secondary | ICD-10-CM | POA: Diagnosis not present

## 2013-02-19 DIAGNOSIS — Z79899 Other long term (current) drug therapy: Secondary | ICD-10-CM | POA: Diagnosis not present

## 2013-02-22 DIAGNOSIS — Z0000 Encounter for general adult medical examination without abnormal findings: Secondary | ICD-10-CM | POA: Diagnosis not present

## 2013-02-22 DIAGNOSIS — M545 Low back pain: Secondary | ICD-10-CM | POA: Diagnosis not present

## 2013-02-22 DIAGNOSIS — F0390 Unspecified dementia without behavioral disturbance: Secondary | ICD-10-CM | POA: Diagnosis not present

## 2013-02-22 DIAGNOSIS — F209 Schizophrenia, unspecified: Secondary | ICD-10-CM | POA: Diagnosis not present

## 2013-02-22 DIAGNOSIS — E785 Hyperlipidemia, unspecified: Secondary | ICD-10-CM | POA: Diagnosis not present

## 2013-02-22 DIAGNOSIS — J310 Chronic rhinitis: Secondary | ICD-10-CM | POA: Diagnosis not present

## 2013-03-04 DIAGNOSIS — IMO0002 Reserved for concepts with insufficient information to code with codable children: Secondary | ICD-10-CM | POA: Diagnosis not present

## 2013-03-10 DIAGNOSIS — M48061 Spinal stenosis, lumbar region without neurogenic claudication: Secondary | ICD-10-CM | POA: Diagnosis not present

## 2013-03-23 ENCOUNTER — Encounter (HOSPITAL_COMMUNITY): Payer: Self-pay | Admitting: Emergency Medicine

## 2013-03-23 ENCOUNTER — Emergency Department (HOSPITAL_COMMUNITY)
Admission: EM | Admit: 2013-03-23 | Discharge: 2013-03-23 | Disposition: A | Discharge status: Home / Self Care | Payer: Medicare Other | Attending: Emergency Medicine | Admitting: Emergency Medicine

## 2013-03-23 DIAGNOSIS — F0390 Unspecified dementia without behavioral disturbance: Secondary | ICD-10-CM | POA: Insufficient documentation

## 2013-03-23 DIAGNOSIS — Z87891 Personal history of nicotine dependence: Secondary | ICD-10-CM | POA: Diagnosis not present

## 2013-03-23 DIAGNOSIS — Z794 Long term (current) use of insulin: Secondary | ICD-10-CM | POA: Diagnosis not present

## 2013-03-23 DIAGNOSIS — I10 Essential (primary) hypertension: Secondary | ICD-10-CM | POA: Insufficient documentation

## 2013-03-23 DIAGNOSIS — E119 Type 2 diabetes mellitus without complications: Secondary | ICD-10-CM | POA: Insufficient documentation

## 2013-03-23 DIAGNOSIS — F411 Generalized anxiety disorder: Secondary | ICD-10-CM | POA: Insufficient documentation

## 2013-03-23 DIAGNOSIS — Z79899 Other long term (current) drug therapy: Secondary | ICD-10-CM | POA: Diagnosis not present

## 2013-03-23 DIAGNOSIS — Z7982 Long term (current) use of aspirin: Secondary | ICD-10-CM | POA: Insufficient documentation

## 2013-03-23 DIAGNOSIS — Z87448 Personal history of other diseases of urinary system: Secondary | ICD-10-CM | POA: Insufficient documentation

## 2013-03-23 DIAGNOSIS — K219 Gastro-esophageal reflux disease without esophagitis: Secondary | ICD-10-CM | POA: Diagnosis not present

## 2013-03-23 DIAGNOSIS — D509 Iron deficiency anemia, unspecified: Secondary | ICD-10-CM | POA: Insufficient documentation

## 2013-03-23 DIAGNOSIS — F209 Schizophrenia, unspecified: Secondary | ICD-10-CM | POA: Insufficient documentation

## 2013-03-23 DIAGNOSIS — R55 Syncope and collapse: Secondary | ICD-10-CM | POA: Insufficient documentation

## 2013-03-23 DIAGNOSIS — I2510 Atherosclerotic heart disease of native coronary artery without angina pectoris: Secondary | ICD-10-CM | POA: Insufficient documentation

## 2013-03-23 DIAGNOSIS — R404 Transient alteration of awareness: Secondary | ICD-10-CM | POA: Diagnosis not present

## 2013-03-23 LAB — URINALYSIS, ROUTINE W REFLEX MICROSCOPIC
Glucose, UA: NEGATIVE mg/dL
Hgb urine dipstick: NEGATIVE
Protein, ur: NEGATIVE mg/dL
Specific Gravity, Urine: 1.017 (ref 1.005–1.030)
Urobilinogen, UA: 0.2 mg/dL (ref 0.0–1.0)

## 2013-03-23 LAB — CBC WITH DIFFERENTIAL/PLATELET
Eosinophils Absolute: 0.1 10*3/uL (ref 0.0–0.7)
Hemoglobin: 11.5 g/dL — ABNORMAL LOW (ref 12.0–15.0)
Lymphocytes Relative: 27 % (ref 12–46)
Lymphs Abs: 3 10*3/uL (ref 0.7–4.0)
Monocytes Relative: 7 % (ref 3–12)
Neutro Abs: 7.3 10*3/uL (ref 1.7–7.7)
Neutrophils Relative %: 66 % (ref 43–77)
Platelets: 273 10*3/uL (ref 150–400)
RBC: 3.69 MIL/uL — ABNORMAL LOW (ref 3.87–5.11)
WBC: 11.2 10*3/uL — ABNORMAL HIGH (ref 4.0–10.5)

## 2013-03-23 LAB — BASIC METABOLIC PANEL
BUN: 15 mg/dL (ref 6–23)
CO2: 25 mEq/L (ref 19–32)
Calcium: 9.9 mg/dL (ref 8.4–10.5)
Glucose, Bld: 198 mg/dL — ABNORMAL HIGH (ref 70–99)
Potassium: 4.4 mEq/L (ref 3.5–5.1)
Sodium: 139 mEq/L (ref 135–145)

## 2013-03-23 LAB — URINE MICROSCOPIC-ADD ON

## 2013-03-23 LAB — GLUCOSE, CAPILLARY: Glucose-Capillary: 180 mg/dL — ABNORMAL HIGH (ref 70–99)

## 2013-03-23 NOTE — ED Notes (Signed)
Per ems-- pt called 911 to ask what time it was and if it was day or night (x2). Pt also reports that at 1745 passed out on way to bathroom. Pt came to and was on knees leaned up against counter. No injury. Pt dx with anemia 3 weeks ago. cbg 125. Denies nvd. Pt has had sinus/allergy issues. Pt from independent living. Pt ambulatory.

## 2013-03-23 NOTE — ED Notes (Signed)
Pt attempted to give urine specimen but still unable.

## 2013-03-23 NOTE — ED Notes (Addendum)
Pt states that she fell due to feeling weak and her legs buckled under her. States she remembers everything.

## 2013-03-23 NOTE — ED Notes (Signed)
Pt asked if she could obtain a urine sample. Pt stated that she doesn't have the urge to go yet. Will follow up shortly.

## 2013-03-23 NOTE — ED Notes (Signed)
Pt ambulated in room.  

## 2013-03-23 NOTE — ED Provider Notes (Signed)
CSN: [id number]     Arrival date & time 03/23/13  1900 History   First MD Initiated Contact with Patient 03/23/13 1913     Chief Complaint  Patient presents with  . Loss of Consciousness   (Consider location/radiation/quality/duration/timing/severity/associated sxs/prior Treatment) Patient is a 72 y.o. female presenting with syncope.  Loss of Consciousness Associated symptoms: no chest pain, no fever, no headaches, no nausea, no shortness of breath and no vomiting    [name] is a 72 y.o. female who presents to the ED for concern of two episodes of almost losing consciousness.  Patient reports that she took at nap at around 2:00 and when she woke up she wasn't sure if it was AM or PM since it was 6:30 and it was dark outside.  She decided to settle this by calling 911.  Afterwards, she reports that she got out of bed and just felt week.  She reports that she had to sit down on her knees.  She started to feel better and stood up again, but again she felt weak and had to sit down again on her knees.  Both times, she did not actually lose consciousness.  No SOB/CP/palpiations.  EMS called, and patient with normal CBG and mentation.  Brought in here for further evaluation.  Past Medical History  Diagnosis Date  . Anemia, iron deficiency   . Anxiety   . CAD (coronary artery disease)   . Type II or unspecified type diabetes mellitus without mention of complication, not stated as uncontrolled   . GERD (gastroesophageal reflux disease)   . HTN (hypertension)   . Peripheral neuropathy   . Schizophrenia   . Hyperlipidemia   . Allergic rhinitis   . 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 Dementia   . IBS (irritable bowel syndrome)   . Syncope     EP study 1996 w/atrial tachycardia  . Esophageal motility disorder   . Renal insufficiency    Past Surgical History  Procedure Laterality Date  . Abdominal hysterectomy    . Ptca  1995  . Knee arthroplasty      Right   . Breast biopsy      Negative   Family History  Problem Relation Age of Onset  . Schizophrenia Neg Hx   . Coronary artery disease Neg Hx   . Colon cancer Neg Hx   . Breast cancer Neg Hx   . Hypertension Other   . Diabetes Other    History  Substance Use Topics  . Smoking status: Former Games developer  . Smokeless tobacco: Never Used  . Alcohol Use: No   OB History   Grav Para Term Preterm Abortions TAB SAB Ect Mult Living                 Review of Systems  Constitutional: Negative for fever and chills.  HENT: Negative for congestion and rhinorrhea.   Respiratory: Negative for cough and shortness of breath.   Cardiovascular: Positive for syncope. Negative for chest pain.  Gastrointestinal: Negative for nausea, vomiting, abdominal pain, diarrhea and abdominal distention.  Endocrine: Negative for polyuria.  Genitourinary: Negative for dysuria.  Musculoskeletal: Negative for neck pain and neck stiffness.  Skin: Negative for rash.  Neurological: Negative for headaches.  Psychiatric/Behavioral: Negative.     Allergies  Codeine phosphate; Glimepiride; Glyburide; Shellfish allergy; and Tramadol  Home Medications   Current Outpatient Rx  Name  Route  Sig  Dispense  Refill  .  acetaminophen (TYLENOL) 500 MG tablet   Oral   Take 500-1,000 mg by mouth 3 (three) times daily as needed. For back pain          . amitriptyline (ELAVIL) 25 MG tablet   Oral   Take 1 tablet (25 mg total) by mouth at bedtime.   30 tablet   11   . amLODipine (NORVASC) 10 MG tablet   Oral   Take 1 tablet (10 mg total) by mouth daily.   30 tablet   0     NO MORE REFILLS UNTIL SEEN IN THE OFFICE   . aspirin 325 MG tablet   Oral   Take 325 mg by mouth daily.           . benztropine (COGENTIN) 0.5 MG tablet   Oral   Take 0.5 mg by mouth daily.           . Cholecalciferol 1000 UNITS tablet   Oral   Take 1,000 Units by mouth daily.           . clonazePAM (KLONOPIN) 1 MG tablet    Oral   Take 1 mg by mouth at bedtime as needed.          . cloZAPine (CLOZARIL) 100 MG tablet   Oral   Take 200 mg by mouth at bedtime.          . donepezil (ARICEPT) 10 MG tablet   Oral   Take 1 tablet (10 mg total) by mouth at bedtime as needed.   30 tablet   0     NO MORE REFILLS UNTIL SEEN IN THE OFFICE   . ferrous sulfate 325 (65 FE) MG tablet      TAKE 1 TABLET IN THE MORNING WITH BREAKFAST   30 tablet   0   . fluticasone (FLONASE) 50 MCG/ACT nasal spray               . furosemide (LASIX) 40 MG tablet   Oral   Take 0.5 tablets (20 mg total) by mouth daily. For systolic blood pressure management & fluid accumulation.   30 tablet   5   . gabapentin (NEURONTIN) 600 MG tablet   Oral   Take 1 tablet (600 mg total) by mouth 3 (three) times daily.   90 tablet   5     PT NEEDS TO SCHEDULE PHYSICAL EXAM   . glucose blood test strip      Please dispense ascensia breeze test strips, use as directed one to two times daily.  Diagnosis code: 250.0   100 each   3   . haloperidol (HALDOL) 10 MG tablet   Oral   Take 10 mg by mouth at bedtime.           . insulin glargine (LANTUS SOLOSTAR) 100 UNIT/ML injection   Subcutaneous   Inject 15 Units into the skin at bedtime.   15 mL   6   . insulin NPH-regular (NOVOLIN 70/30) (70-30) 100 UNIT/ML injection   Subcutaneous   Inject 25 Units into the skin 2 (two) times daily with a meal. CHECK BLOOD SUGAR BEFORE TAKING. DO NOT TAKE IF BLOOD SUGAR IS BELOW 80   15 mL   0     PATIENT IS DUE FOR AN OFFICE VISIT   . Insulin Pen Needle (NOVOFINE) 30G X 8 MM MISC   Subcutaneous   Inject 10 each into the skin as needed.   100 each   2   .  EXPIRED: ipratropium (ATROVENT) 0.03 % nasal spray   Nasal   Place 2 sprays into the nose 3 (three) times daily.   30 mL   0   . LANTUS SOLOSTAR 100 UNIT/ML SOPN      INJECT 15 UNITS S.Q. AT BEDTIME.   15 mL   0   . levocetirizine (XYZAL) 5 MG tablet               .  meloxicam (MOBIC) 15 MG tablet   Oral   Take 1 tablet (15 mg total) by mouth daily.   30 tablet   1     PT NEEDS TO SCHEDULE PHYSICAL EXAM FOR FURTHER REF ...   . memantine (NAMENDA) 10 MG tablet   Oral   Take 1 tablet (10 mg total) by mouth 2 (two) times daily.   60 tablet   0     NO MORE REFILLS UNTIL SEEN IN THE OFFICE   . Multiple Vitamins-Minerals (MULTIVITAMIN,TX-MINERALS) tablet   Oral   Take 1 tablet by mouth daily.           Marland Kitchen NEXIUM 40 MG capsule               . NOVOLOG MIX 70/30 FLEXPEN (70-30) 100 UNIT/ML injection      INJECT 25 UNITS S.Q. TWICE A DAY. <<CHECK SUGAR BEFORE TAKING, DONT TA KE IF SUGAR IS BELOW 80!>>   15 mL   2     PATIENT IS DUE FOR A ROUTINE PHYSICAL EXAM   . pioglitazone (ACTOS) 30 MG tablet   Oral   Take 1 tablet (30 mg total) by mouth daily.   30 tablet   1     PT NEEDS TO SCHEDULE PHYSICAL EXAM FOR FURTHER REF ...   . pioglitazone (ACTOS) 30 MG tablet   Oral   Take 1 tablet (30 mg total) by mouth daily.   30 tablet   0     PT NEEDS TO SCHEDULE PHYSICAL EXAM FOR FURTHER REF ...   . potassium chloride (KLOR-CON) 8 MEQ tablet   Oral   Take 1 tablet (8 mEq total) by mouth daily.   30 tablet   6     PT NEEDS TO SCHEDULE PHYSICAL EXAM   . PRILOSEC 20 MG capsule      TAKE (2) CAPSULES BY MOUTH ONCE DAILY IN THE MORNING.   60 each   11   . ramipril (ALTACE) 10 MG capsule   Oral   Take 1 capsule (10 mg total) by mouth daily.   30 capsule   0     NO MORE REFILLS UNTIL SEEN IN THE OFFICE, THANKS   . rosuvastatin (CRESTOR) 20 MG tablet   Oral   Take 1 tablet (20 mg total) by mouth daily.   30 tablet   0     NO MORE REFILLS UNTIL SEEN IN THE OFFICE    BP 147/64  Pulse 91  Temp(Src) 98 F (36.7 C) (Oral)  Resp 14  Ht 5\' 3"  (1.6 m)  Wt 182 lb (82.555 kg)  BMI 32.25 kg/m2  SpO2 97% Physical Exam  Nursing note and vitals reviewed. Constitutional: She is oriented to person, place, and time. She appears  well-developed and well-nourished. No distress.  HENT:  Head: Normocephalic and atraumatic.  Right Ear: External ear normal.  Left Ear: External ear normal.  Nose: Nose normal.  Mouth/Throat: Oropharynx is clear and moist. No oropharyngeal exudate.  Eyes: EOM are normal.  Pupils are equal, round, and reactive to light.  Neck: Normal range of motion. Neck supple. No tracheal deviation present.  Cardiovascular: Normal rate.   Pulmonary/Chest: Effort normal and breath sounds normal. No stridor. No respiratory distress. She has no wheezes. She has no rales.  Abdominal: Soft. She exhibits no distension. There is no tenderness. There is no rebound.  Musculoskeletal: Normal range of motion.  Neurological: She is alert and oriented to person, place, and time.  Skin: Skin is warm and dry. She is not diaphoretic.    ED Course  Procedures (including critical care time) Labs Review Labs Reviewed  CBC WITH DIFFERENTIAL - Abnormal; Notable for the following:    WBC 11.2 (*)    RBC 3.69 (*)    Hemoglobin 11.5 (*)    All other components within normal limits  BASIC METABOLIC PANEL - Abnormal; Notable for the following:    Glucose, Bld 198 (*)    GFR calc non Af Amer 64 (*)    GFR calc Af Amer 74 (*)    All other components within normal limits  URINALYSIS, ROUTINE W REFLEX MICROSCOPIC - Abnormal; Notable for the following:    Leukocytes, UA TRACE (*)    All other components within normal limits  GLUCOSE, CAPILLARY - Abnormal; Notable for the following:    Glucose-Capillary 180 (*)    All other components within normal limits  URINE MICROSCOPIC-ADD ON   Imaging Review No results found.  EKG Interpretation   None       MDM   1. Near syncope     [name] is a 72 y.o. female with history of CAD, IDDM2, schizophrenia on haldol, and dementia who presents to the ED with two episodes of near syncope.  EKG without evidence of brugada, prolonged QT, or WPW.  No ischemic changes from  previous.  No symptoms of MI or arrythmia.  Basic labs checked and without significant abnormality.  Patient able to ambulate easily unassisted and feeling better.  Safe for discharge home.  Patient discharged.    Arloa Koh, MD 03/23/13 2329

## 2013-03-24 NOTE — ED Provider Notes (Signed)
I saw and evaluated the patient, reviewed the resident's note and I agree with the findings and plan.  EKG Interpretation    Date/Time:  Tuesday March 23 2013 19:18:40 EST Ventricular Rate:  90 PR Interval:  134 QRS Duration: 85 QT Interval:  389 QTC Calculation: 476 R Axis:   -12 Text Interpretation:  Sinus rhythm Baseline wander in lead(s) V1 No significant change was found Confirmed by [facility]  MD, TREY ([id number]) on 03/23/2013 8:31:40 PM            72 yo female presenting with pre-syncope.  No true syncope.  On exam, well appearing, no distress, normal respiratory effort.  Labs unremarkable.  Plan follow up with PCP.    Clinical Impression: 1. Near syncope       Candyce Churn, MD 03/24/13 1217

## 2013-03-24 NOTE — ED Provider Notes (Signed)
I saw and evaluated the patient, reviewed the resident's note and I agree with the findings and plan.   Candyce Churn, MD 03/24/13 [phone]

## 2013-03-29 DIAGNOSIS — Z79899 Other long term (current) drug therapy: Secondary | ICD-10-CM | POA: Diagnosis not present

## 2013-04-20 DIAGNOSIS — R609 Edema, unspecified: Secondary | ICD-10-CM | POA: Diagnosis not present

## 2013-04-26 DIAGNOSIS — Z79899 Other long term (current) drug therapy: Secondary | ICD-10-CM | POA: Diagnosis not present

## 2013-05-03 DIAGNOSIS — IMO0002 Reserved for concepts with insufficient information to code with codable children: Secondary | ICD-10-CM | POA: Diagnosis not present

## 2013-06-02 DIAGNOSIS — Z79899 Other long term (current) drug therapy: Secondary | ICD-10-CM | POA: Diagnosis not present

## 2013-06-03 DIAGNOSIS — D649 Anemia, unspecified: Secondary | ICD-10-CM | POA: Diagnosis not present

## 2013-06-03 DIAGNOSIS — I10 Essential (primary) hypertension: Secondary | ICD-10-CM | POA: Diagnosis not present

## 2013-06-03 DIAGNOSIS — Z23 Encounter for immunization: Secondary | ICD-10-CM | POA: Diagnosis not present

## 2013-06-03 DIAGNOSIS — IMO0001 Reserved for inherently not codable concepts without codable children: Secondary | ICD-10-CM | POA: Diagnosis not present

## 2013-06-28 ENCOUNTER — Other Ambulatory Visit: Payer: Self-pay | Admitting: Obstetrics and Gynecology

## 2013-06-28 DIAGNOSIS — Z1231 Encounter for screening mammogram for malignant neoplasm of breast: Secondary | ICD-10-CM

## 2013-06-30 ENCOUNTER — Ambulatory Visit
Admission: RE | Admit: 2013-06-30 | Discharge: 2013-06-30 | Disposition: A | Discharge status: Home / Self Care | Payer: Medicare Other | Source: Ambulatory Visit | Attending: Obstetrics and Gynecology | Admitting: Obstetrics and Gynecology

## 2013-06-30 DIAGNOSIS — Z1231 Encounter for screening mammogram for malignant neoplasm of breast: Secondary | ICD-10-CM

## 2013-06-30 DIAGNOSIS — Z79899 Other long term (current) drug therapy: Secondary | ICD-10-CM | POA: Diagnosis not present

## 2013-07-01 DIAGNOSIS — H25049 Posterior subcapsular polar age-related cataract, unspecified eye: Secondary | ICD-10-CM | POA: Diagnosis not present

## 2013-07-01 DIAGNOSIS — E1139 Type 2 diabetes mellitus with other diabetic ophthalmic complication: Secondary | ICD-10-CM | POA: Diagnosis not present

## 2013-07-02 ENCOUNTER — Telehealth: Payer: Self-pay

## 2013-07-02 NOTE — Telephone Encounter (Signed)
The home health care contact called and stated she needs some additional information about a form and dates the patient went on disability.   Contact - Rexene Alberts - [facility] home health care Callback [phone]

## 2013-07-20 DIAGNOSIS — Z01419 Encounter for gynecological examination (general) (routine) without abnormal findings: Secondary | ICD-10-CM | POA: Diagnosis not present

## 2013-07-20 DIAGNOSIS — E119 Type 2 diabetes mellitus without complications: Secondary | ICD-10-CM | POA: Diagnosis not present

## 2013-07-20 DIAGNOSIS — M129 Arthropathy, unspecified: Secondary | ICD-10-CM | POA: Diagnosis not present

## 2013-07-20 DIAGNOSIS — N952 Postmenopausal atrophic vaginitis: Secondary | ICD-10-CM | POA: Diagnosis not present

## 2013-08-11 DIAGNOSIS — IMO0001 Reserved for inherently not codable concepts without codable children: Secondary | ICD-10-CM | POA: Diagnosis not present

## 2013-08-11 DIAGNOSIS — D649 Anemia, unspecified: Secondary | ICD-10-CM | POA: Diagnosis not present

## 2013-08-11 DIAGNOSIS — I10 Essential (primary) hypertension: Secondary | ICD-10-CM | POA: Diagnosis not present

## 2013-08-11 DIAGNOSIS — R1310 Dysphagia, unspecified: Secondary | ICD-10-CM | POA: Diagnosis not present

## 2013-08-11 DIAGNOSIS — F0390 Unspecified dementia without behavioral disturbance: Secondary | ICD-10-CM | POA: Diagnosis not present

## 2013-08-11 DIAGNOSIS — H9190 Unspecified hearing loss, unspecified ear: Secondary | ICD-10-CM | POA: Diagnosis not present

## 2013-08-12 ENCOUNTER — Other Ambulatory Visit: Payer: Self-pay | Admitting: Family Medicine

## 2013-08-12 DIAGNOSIS — R1310 Dysphagia, unspecified: Secondary | ICD-10-CM

## 2013-08-13 DIAGNOSIS — Z79899 Other long term (current) drug therapy: Secondary | ICD-10-CM | POA: Diagnosis not present

## 2013-08-13 DIAGNOSIS — F200 Paranoid schizophrenia: Secondary | ICD-10-CM | POA: Diagnosis not present

## 2013-08-26 ENCOUNTER — Other Ambulatory Visit: Payer: Self-pay | Admitting: Family Medicine

## 2013-08-26 ENCOUNTER — Ambulatory Visit
Admission: RE | Admit: 2013-08-26 | Discharge: 2013-08-26 | Disposition: A | Discharge status: Home / Self Care | Payer: Medicare Other | Source: Ambulatory Visit | Attending: Family Medicine | Admitting: Family Medicine

## 2013-08-26 DIAGNOSIS — K219 Gastro-esophageal reflux disease without esophagitis: Secondary | ICD-10-CM | POA: Diagnosis not present

## 2013-08-26 DIAGNOSIS — R1310 Dysphagia, unspecified: Secondary | ICD-10-CM

## 2013-09-09 DIAGNOSIS — F200 Paranoid schizophrenia: Secondary | ICD-10-CM | POA: Diagnosis not present

## 2013-09-09 DIAGNOSIS — Z79899 Other long term (current) drug therapy: Secondary | ICD-10-CM | POA: Diagnosis not present

## 2013-10-14 DIAGNOSIS — Z79899 Other long term (current) drug therapy: Secondary | ICD-10-CM | POA: Diagnosis not present

## 2013-11-02 DIAGNOSIS — H268 Other specified cataract: Secondary | ICD-10-CM | POA: Diagnosis not present

## 2013-11-02 DIAGNOSIS — H1110 Unspecified conjunctival degenerations: Secondary | ICD-10-CM | POA: Diagnosis not present

## 2013-11-18 DIAGNOSIS — Z79899 Other long term (current) drug therapy: Secondary | ICD-10-CM | POA: Diagnosis not present

## 2013-12-20 DIAGNOSIS — Z79899 Other long term (current) drug therapy: Secondary | ICD-10-CM | POA: Diagnosis not present

## 2014-02-02 DIAGNOSIS — I959 Hypotension, unspecified: Secondary | ICD-10-CM | POA: Diagnosis not present

## 2014-02-02 DIAGNOSIS — E1165 Type 2 diabetes mellitus with hyperglycemia: Secondary | ICD-10-CM | POA: Diagnosis not present

## 2014-02-02 DIAGNOSIS — J310 Chronic rhinitis: Secondary | ICD-10-CM | POA: Diagnosis not present

## 2014-02-02 DIAGNOSIS — I10 Essential (primary) hypertension: Secondary | ICD-10-CM | POA: Diagnosis not present

## 2014-02-02 DIAGNOSIS — D649 Anemia, unspecified: Secondary | ICD-10-CM | POA: Diagnosis not present

## 2014-02-02 DIAGNOSIS — M545 Low back pain: Secondary | ICD-10-CM | POA: Diagnosis not present

## 2014-02-02 DIAGNOSIS — E784 Other hyperlipidemia: Secondary | ICD-10-CM | POA: Diagnosis not present

## 2014-02-02 DIAGNOSIS — R300 Dysuria: Secondary | ICD-10-CM | POA: Diagnosis not present

## 2014-02-03 DIAGNOSIS — Z79899 Other long term (current) drug therapy: Secondary | ICD-10-CM | POA: Diagnosis not present

## 2014-02-03 DIAGNOSIS — F200 Paranoid schizophrenia: Secondary | ICD-10-CM | POA: Diagnosis not present

## 2014-03-03 DIAGNOSIS — R399 Unspecified symptoms and signs involving the genitourinary system: Secondary | ICD-10-CM | POA: Diagnosis not present

## 2014-03-10 DIAGNOSIS — F202 Catatonic schizophrenia: Secondary | ICD-10-CM | POA: Diagnosis not present

## 2014-03-15 DIAGNOSIS — R300 Dysuria: Secondary | ICD-10-CM | POA: Diagnosis not present

## 2014-03-15 DIAGNOSIS — E1165 Type 2 diabetes mellitus with hyperglycemia: Secondary | ICD-10-CM | POA: Diagnosis not present

## 2014-03-15 DIAGNOSIS — I10 Essential (primary) hypertension: Secondary | ICD-10-CM | POA: Diagnosis not present

## 2014-04-18 DIAGNOSIS — F200 Paranoid schizophrenia: Secondary | ICD-10-CM | POA: Diagnosis not present

## 2014-05-19 DIAGNOSIS — F200 Paranoid schizophrenia: Secondary | ICD-10-CM | POA: Diagnosis not present

## 2014-06-23 DIAGNOSIS — F200 Paranoid schizophrenia: Secondary | ICD-10-CM | POA: Diagnosis not present

## 2014-08-04 DIAGNOSIS — Z79899 Other long term (current) drug therapy: Secondary | ICD-10-CM | POA: Diagnosis not present

## 2014-08-04 DIAGNOSIS — F200 Paranoid schizophrenia: Secondary | ICD-10-CM | POA: Diagnosis not present

## 2014-09-20 DIAGNOSIS — Z79899 Other long term (current) drug therapy: Secondary | ICD-10-CM | POA: Diagnosis not present

## 2014-10-21 DIAGNOSIS — Z23 Encounter for immunization: Secondary | ICD-10-CM | POA: Diagnosis not present

## 2014-10-21 DIAGNOSIS — Z0001 Encounter for general adult medical examination with abnormal findings: Secondary | ICD-10-CM | POA: Diagnosis not present

## 2014-10-21 DIAGNOSIS — Z794 Long term (current) use of insulin: Secondary | ICD-10-CM | POA: Diagnosis not present

## 2014-10-21 DIAGNOSIS — E785 Hyperlipidemia, unspecified: Secondary | ICD-10-CM | POA: Diagnosis not present

## 2014-10-21 DIAGNOSIS — F0390 Unspecified dementia without behavioral disturbance: Secondary | ICD-10-CM | POA: Diagnosis not present

## 2014-10-21 DIAGNOSIS — Z79899 Other long term (current) drug therapy: Secondary | ICD-10-CM | POA: Diagnosis not present

## 2014-10-21 DIAGNOSIS — G629 Polyneuropathy, unspecified: Secondary | ICD-10-CM | POA: Diagnosis not present

## 2014-10-21 DIAGNOSIS — E1140 Type 2 diabetes mellitus with diabetic neuropathy, unspecified: Secondary | ICD-10-CM | POA: Diagnosis not present

## 2014-10-21 DIAGNOSIS — D649 Anemia, unspecified: Secondary | ICD-10-CM | POA: Diagnosis not present

## 2014-10-21 DIAGNOSIS — I10 Essential (primary) hypertension: Secondary | ICD-10-CM | POA: Diagnosis not present

## 2014-11-10 DIAGNOSIS — F200 Paranoid schizophrenia: Secondary | ICD-10-CM | POA: Diagnosis not present

## 2014-11-10 DIAGNOSIS — Z79899 Other long term (current) drug therapy: Secondary | ICD-10-CM | POA: Diagnosis not present

## 2014-12-08 DIAGNOSIS — Z79899 Other long term (current) drug therapy: Secondary | ICD-10-CM | POA: Diagnosis not present

## 2014-12-08 DIAGNOSIS — F200 Paranoid schizophrenia: Secondary | ICD-10-CM | POA: Diagnosis not present

## 2014-12-09 ENCOUNTER — Encounter (HOSPITAL_COMMUNITY): Payer: Self-pay | Admitting: *Deleted

## 2014-12-09 ENCOUNTER — Inpatient Hospital Stay (HOSPITAL_COMMUNITY)
Admission: EM | Admit: 2014-12-09 | Discharge: 2014-12-27 | DRG: 870 | Disposition: A | Discharge status: Skilled Nursing Facility | Payer: Medicare Other | Attending: Oncology | Admitting: Oncology

## 2014-12-09 ENCOUNTER — Emergency Department (HOSPITAL_COMMUNITY): Payer: Medicare Other

## 2014-12-09 DIAGNOSIS — G9389 Other specified disorders of brain: Secondary | ICD-10-CM | POA: Diagnosis not present

## 2014-12-09 DIAGNOSIS — E872 Acidosis: Secondary | ICD-10-CM | POA: Diagnosis present

## 2014-12-09 DIAGNOSIS — R404 Transient alteration of awareness: Secondary | ICD-10-CM | POA: Diagnosis not present

## 2014-12-09 DIAGNOSIS — Z87891 Personal history of nicotine dependence: Secondary | ICD-10-CM | POA: Diagnosis not present

## 2014-12-09 DIAGNOSIS — I739 Peripheral vascular disease, unspecified: Secondary | ICD-10-CM | POA: Diagnosis present

## 2014-12-09 DIAGNOSIS — J9601 Acute respiratory failure with hypoxia: Secondary | ICD-10-CM

## 2014-12-09 DIAGNOSIS — R1310 Dysphagia, unspecified: Secondary | ICD-10-CM | POA: Diagnosis not present

## 2014-12-09 DIAGNOSIS — Z452 Encounter for adjustment and management of vascular access device: Secondary | ICD-10-CM

## 2014-12-09 DIAGNOSIS — K580 Irritable bowel syndrome with diarrhea: Secondary | ICD-10-CM | POA: Diagnosis present

## 2014-12-09 DIAGNOSIS — D638 Anemia in other chronic diseases classified elsewhere: Secondary | ICD-10-CM | POA: Diagnosis present

## 2014-12-09 DIAGNOSIS — I517 Cardiomegaly: Secondary | ICD-10-CM | POA: Diagnosis not present

## 2014-12-09 DIAGNOSIS — R1312 Dysphagia, oropharyngeal phase: Secondary | ICD-10-CM | POA: Diagnosis not present

## 2014-12-09 DIAGNOSIS — Z7982 Long term (current) use of aspirin: Secondary | ICD-10-CM

## 2014-12-09 DIAGNOSIS — I2510 Atherosclerotic heart disease of native coronary artery without angina pectoris: Secondary | ICD-10-CM | POA: Diagnosis present

## 2014-12-09 DIAGNOSIS — E870 Hyperosmolality and hypernatremia: Secondary | ICD-10-CM

## 2014-12-09 DIAGNOSIS — F209 Schizophrenia, unspecified: Secondary | ICD-10-CM | POA: Diagnosis present

## 2014-12-09 DIAGNOSIS — Z794 Long term (current) use of insulin: Secondary | ICD-10-CM | POA: Diagnosis not present

## 2014-12-09 DIAGNOSIS — G9340 Encephalopathy, unspecified: Secondary | ICD-10-CM | POA: Diagnosis not present

## 2014-12-09 DIAGNOSIS — J309 Allergic rhinitis, unspecified: Secondary | ICD-10-CM | POA: Diagnosis present

## 2014-12-09 DIAGNOSIS — J811 Chronic pulmonary edema: Secondary | ICD-10-CM | POA: Diagnosis not present

## 2014-12-09 DIAGNOSIS — R34 Anuria and oliguria: Secondary | ICD-10-CM | POA: Diagnosis not present

## 2014-12-09 DIAGNOSIS — J9600 Acute respiratory failure, unspecified whether with hypoxia or hypercapnia: Secondary | ICD-10-CM

## 2014-12-09 DIAGNOSIS — R4182 Altered mental status, unspecified: Secondary | ICD-10-CM

## 2014-12-09 DIAGNOSIS — J189 Pneumonia, unspecified organism: Secondary | ICD-10-CM | POA: Diagnosis present

## 2014-12-09 DIAGNOSIS — J80 Acute respiratory distress syndrome: Secondary | ICD-10-CM | POA: Diagnosis not present

## 2014-12-09 DIAGNOSIS — F200 Paranoid schizophrenia: Secondary | ICD-10-CM | POA: Diagnosis present

## 2014-12-09 DIAGNOSIS — R6520 Severe sepsis without septic shock: Secondary | ICD-10-CM | POA: Diagnosis present

## 2014-12-09 DIAGNOSIS — F0390 Unspecified dementia without behavioral disturbance: Secondary | ICD-10-CM | POA: Diagnosis not present

## 2014-12-09 DIAGNOSIS — E1140 Type 2 diabetes mellitus with diabetic neuropathy, unspecified: Secondary | ICD-10-CM | POA: Diagnosis present

## 2014-12-09 DIAGNOSIS — A419 Sepsis, unspecified organism: Principal | ICD-10-CM | POA: Diagnosis present

## 2014-12-09 DIAGNOSIS — E876 Hypokalemia: Secondary | ICD-10-CM | POA: Diagnosis not present

## 2014-12-09 DIAGNOSIS — E785 Hyperlipidemia, unspecified: Secondary | ICD-10-CM | POA: Diagnosis present

## 2014-12-09 DIAGNOSIS — Z23 Encounter for immunization: Secondary | ICD-10-CM

## 2014-12-09 DIAGNOSIS — F419 Anxiety disorder, unspecified: Secondary | ICD-10-CM | POA: Diagnosis present

## 2014-12-09 DIAGNOSIS — J9690 Respiratory failure, unspecified, unspecified whether with hypoxia or hypercapnia: Secondary | ICD-10-CM

## 2014-12-09 DIAGNOSIS — R6510 Systemic inflammatory response syndrome (SIRS) of non-infectious origin without acute organ dysfunction: Secondary | ICD-10-CM

## 2014-12-09 DIAGNOSIS — I771 Stricture of artery: Secondary | ICD-10-CM | POA: Diagnosis present

## 2014-12-09 DIAGNOSIS — K219 Gastro-esophageal reflux disease without esophagitis: Secondary | ICD-10-CM | POA: Diagnosis present

## 2014-12-09 DIAGNOSIS — M6281 Muscle weakness (generalized): Secondary | ICD-10-CM | POA: Diagnosis not present

## 2014-12-09 DIAGNOSIS — E119 Type 2 diabetes mellitus without complications: Secondary | ICD-10-CM

## 2014-12-09 DIAGNOSIS — E8770 Fluid overload, unspecified: Secondary | ICD-10-CM | POA: Diagnosis not present

## 2014-12-09 DIAGNOSIS — T17990A Other foreign object in respiratory tract, part unspecified in causing asphyxiation, initial encounter: Secondary | ICD-10-CM | POA: Diagnosis not present

## 2014-12-09 DIAGNOSIS — R05 Cough: Secondary | ICD-10-CM | POA: Diagnosis not present

## 2014-12-09 DIAGNOSIS — K224 Dyskinesia of esophagus: Secondary | ICD-10-CM | POA: Diagnosis present

## 2014-12-09 DIAGNOSIS — D649 Anemia, unspecified: Secondary | ICD-10-CM | POA: Diagnosis present

## 2014-12-09 DIAGNOSIS — I10 Essential (primary) hypertension: Secondary | ICD-10-CM | POA: Diagnosis not present

## 2014-12-09 DIAGNOSIS — D509 Iron deficiency anemia, unspecified: Secondary | ICD-10-CM | POA: Diagnosis present

## 2014-12-09 DIAGNOSIS — G309 Alzheimer's disease, unspecified: Secondary | ICD-10-CM | POA: Diagnosis present

## 2014-12-09 DIAGNOSIS — I509 Heart failure, unspecified: Secondary | ICD-10-CM | POA: Diagnosis not present

## 2014-12-09 DIAGNOSIS — R531 Weakness: Secondary | ICD-10-CM | POA: Diagnosis present

## 2014-12-09 DIAGNOSIS — J209 Acute bronchitis, unspecified: Secondary | ICD-10-CM | POA: Diagnosis not present

## 2014-12-09 DIAGNOSIS — J181 Lobar pneumonia, unspecified organism: Secondary | ICD-10-CM | POA: Diagnosis not present

## 2014-12-09 DIAGNOSIS — R059 Cough, unspecified: Secondary | ICD-10-CM

## 2014-12-09 DIAGNOSIS — T85598A Other mechanical complication of other gastrointestinal prosthetic devices, implants and grafts, initial encounter: Secondary | ICD-10-CM

## 2014-12-09 DIAGNOSIS — E118 Type 2 diabetes mellitus with unspecified complications: Secondary | ICD-10-CM | POA: Diagnosis not present

## 2014-12-09 DIAGNOSIS — J9811 Atelectasis: Secondary | ICD-10-CM | POA: Diagnosis not present

## 2014-12-09 DIAGNOSIS — F0280 Dementia in other diseases classified elsewhere without behavioral disturbance: Secondary | ICD-10-CM | POA: Diagnosis present

## 2014-12-09 DIAGNOSIS — Z978 Presence of other specified devices: Secondary | ICD-10-CM

## 2014-12-09 DIAGNOSIS — Z4659 Encounter for fitting and adjustment of other gastrointestinal appliance and device: Secondary | ICD-10-CM

## 2014-12-09 DIAGNOSIS — G259 Extrapyramidal and movement disorder, unspecified: Secondary | ICD-10-CM | POA: Insufficient documentation

## 2014-12-09 DIAGNOSIS — Z4682 Encounter for fitting and adjustment of non-vascular catheter: Secondary | ICD-10-CM | POA: Diagnosis not present

## 2014-12-09 HISTORY — DX: Pneumonia, unspecified organism: J18.9

## 2014-12-09 LAB — URINALYSIS, ROUTINE W REFLEX MICROSCOPIC
BILIRUBIN URINE: NEGATIVE
Glucose, UA: NEGATIVE mg/dL
Hgb urine dipstick: NEGATIVE
KETONES UR: NEGATIVE mg/dL
NITRITE: NEGATIVE
Protein, ur: 30 mg/dL — AB
Specific Gravity, Urine: 1.025 (ref 1.005–1.030)
Urobilinogen, UA: 1 mg/dL (ref 0.0–1.0)
pH: 6 (ref 5.0–8.0)

## 2014-12-09 LAB — CBG MONITORING, ED
GLUCOSE-CAPILLARY: 70 mg/dL (ref 65–99)
Glucose-Capillary: 129 mg/dL — ABNORMAL HIGH (ref 65–99)

## 2014-12-09 LAB — CBC WITH DIFFERENTIAL/PLATELET
BASOS ABS: 0 10*3/uL (ref 0.0–0.1)
BASOS PCT: 0 % (ref 0–1)
EOS PCT: 0 % (ref 0–5)
Eosinophils Absolute: 0 10*3/uL (ref 0.0–0.7)
HCT: 28.1 % — ABNORMAL LOW (ref 36.0–46.0)
Hemoglobin: 9 g/dL — ABNORMAL LOW (ref 12.0–15.0)
Lymphocytes Relative: 8 % — ABNORMAL LOW (ref 12–46)
Lymphs Abs: 1.6 10*3/uL (ref 0.7–4.0)
MCH: 30.9 pg (ref 26.0–34.0)
MCHC: 32 g/dL (ref 30.0–36.0)
MCV: 96.6 fL (ref 78.0–100.0)
MONO ABS: 1.7 10*3/uL — AB (ref 0.1–1.0)
Monocytes Relative: 9 % (ref 3–12)
Neutro Abs: 16 10*3/uL — ABNORMAL HIGH (ref 1.7–7.7)
Neutrophils Relative %: 83 % — ABNORMAL HIGH (ref 43–77)
PLATELETS: 391 10*3/uL (ref 150–400)
RBC: 2.91 MIL/uL — AB (ref 3.87–5.11)
RDW: 15.6 % — AB (ref 11.5–15.5)
WBC: 19.3 10*3/uL — AB (ref 4.0–10.5)

## 2014-12-09 LAB — COMPREHENSIVE METABOLIC PANEL
ALK PHOS: 109 U/L (ref 38–126)
ALT: 24 U/L (ref 14–54)
ANION GAP: 11 (ref 5–15)
AST: 29 U/L (ref 15–41)
Albumin: 2.1 g/dL — ABNORMAL LOW (ref 3.5–5.0)
BILIRUBIN TOTAL: 0.3 mg/dL (ref 0.3–1.2)
BUN: 20 mg/dL (ref 6–20)
CO2: 22 mmol/L (ref 22–32)
Calcium: 9.2 mg/dL (ref 8.9–10.3)
Chloride: 107 mmol/L (ref 101–111)
Creatinine, Ser: 1.12 mg/dL — ABNORMAL HIGH (ref 0.44–1.00)
GFR calc Af Amer: 55 mL/min — ABNORMAL LOW (ref >60)
GFR, EST NON AFRICAN AMERICAN: 47 mL/min — AB (ref >60)
Glucose, Bld: 93 mg/dL (ref 65–99)
Potassium: 4.3 mmol/L (ref 3.5–5.1)
Sodium: 140 mmol/L (ref 135–145)
TOTAL PROTEIN: 6.3 g/dL — AB (ref 6.5–8.1)

## 2014-12-09 LAB — URINE MICROSCOPIC-ADD ON

## 2014-12-09 LAB — POC OCCULT BLOOD, ED: Fecal Occult Bld: NEGATIVE

## 2014-12-09 LAB — I-STAT CG4 LACTIC ACID, ED
LACTIC ACID, VENOUS: 2.46 mmol/L — AB (ref 0.5–2.0)
LACTIC ACID, VENOUS: 3.56 mmol/L — AB (ref 0.5–2.0)
Lactic Acid, Venous: 0.97 mmol/L (ref 0.5–2.0)

## 2014-12-09 LAB — BRAIN NATRIURETIC PEPTIDE: B Natriuretic Peptide: 83 pg/mL (ref 0.0–100.0)

## 2014-12-09 LAB — I-STAT TROPONIN, ED: TROPONIN I, POC: 0 ng/mL (ref 0.00–0.08)

## 2014-12-09 LAB — GLUCOSE, CAPILLARY: GLUCOSE-CAPILLARY: 122 mg/dL — AB (ref 65–99)

## 2014-12-09 LAB — LACTIC ACID, PLASMA: Lactic Acid, Venous: 1.9 mmol/L (ref 0.5–2.0)

## 2014-12-09 MED ORDER — SUPER HIGH VITAMINS/MINERALS PO TABS: 1.0000 | ORAL_TABLET | Freq: Every day | ORAL | Status: DC

## 2014-12-09 MED ORDER — DEXTROSE 5 % IV SOLN
500.0000 mg | INTRAVENOUS | Status: AC
  Administered 2014-12-10 – 2014-12-16 (×7): 500 mg via INTRAVENOUS
  Filled 2014-12-09 (×7): qty 500

## 2014-12-09 MED ORDER — MEMANTINE HCL 10 MG PO TABS
10.0000 mg | ORAL_TABLET | Freq: Two times a day (BID) | ORAL | Status: DC
  Administered 2014-12-09 – 2014-12-10 (×2): 10 mg via ORAL
  Filled 2014-12-09 (×3): qty 1

## 2014-12-09 MED ORDER — DEXTROSE 5 % IV SOLN
1.0000 g | INTRAVENOUS | Status: DC
  Administered 2014-12-10: 1 g via INTRAVENOUS
  Filled 2014-12-09 (×2): qty 10

## 2014-12-09 MED ORDER — CLONAZEPAM 0.5 MG PO TABS
0.2500 mg | ORAL_TABLET | Freq: Every day | ORAL | Status: DC
  Administered 2014-12-09: 0.25 mg via ORAL
  Filled 2014-12-09: qty 1

## 2014-12-09 MED ORDER — ACETAMINOPHEN 650 MG RE SUPP
650.0000 mg | Freq: Once | RECTAL | Status: AC
  Administered 2014-12-09: 650 mg via RECTAL
  Filled 2014-12-09: qty 1

## 2014-12-09 MED ORDER — FERROUS SULFATE 325 (65 FE) MG PO TABS
325.0000 mg | ORAL_TABLET | Freq: Every day | ORAL | Status: DC
  Administered 2014-12-10: 325 mg via ORAL
  Filled 2014-12-09 (×2): qty 1

## 2014-12-09 MED ORDER — AMITRIPTYLINE HCL 25 MG PO TABS
25.0000 mg | ORAL_TABLET | Freq: Every day | ORAL | Status: DC
  Administered 2014-12-09: 25 mg via ORAL
  Filled 2014-12-09 (×2): qty 1

## 2014-12-09 MED ORDER — INSULIN GLARGINE 100 UNIT/ML ~~LOC~~ SOLN
5.0000 [IU] | Freq: Every day | SUBCUTANEOUS | Status: DC
  Administered 2014-12-09: 5 [IU] via SUBCUTANEOUS
  Filled 2014-12-09 (×3): qty 0.05

## 2014-12-09 MED ORDER — CLOZAPINE 100 MG PO TABS
200.0000 mg | ORAL_TABLET | Freq: Every day | ORAL | Status: DC
  Administered 2014-12-09: 200 mg via ORAL
  Filled 2014-12-09 (×2): qty 2

## 2014-12-09 MED ORDER — INSULIN ASPART 100 UNIT/ML ~~LOC~~ SOLN: 0.0000 [IU] | Freq: Every day | SUBCUTANEOUS | Status: DC

## 2014-12-09 MED ORDER — ASPIRIN 325 MG PO TABS
325.0000 mg | ORAL_TABLET | Freq: Every day | ORAL | Status: DC
  Administered 2014-12-10: 325 mg via ORAL
  Filled 2014-12-09 (×2): qty 1

## 2014-12-09 MED ORDER — HALOPERIDOL 5 MG PO TABS
10.0000 mg | ORAL_TABLET | Freq: Two times a day (BID) | ORAL | Status: DC
  Filled 2014-12-09 (×2): qty 2

## 2014-12-09 MED ORDER — BENZTROPINE MESYLATE 2 MG PO TABS
2.0000 mg | ORAL_TABLET | Freq: Every day | ORAL | Status: DC
  Administered 2014-12-10: 2 mg via ORAL
  Filled 2014-12-09: qty 1

## 2014-12-09 MED ORDER — SODIUM CHLORIDE 0.9 % IV SOLN
INTRAVENOUS | Status: AC
  Administered 2014-12-09: 22:00:00 via INTRAVENOUS

## 2014-12-09 MED ORDER — DEXTROSE 5 % IV SOLN
1.0000 g | Freq: Once | INTRAVENOUS | Status: AC
  Administered 2014-12-09: 1 g via INTRAVENOUS
  Filled 2014-12-09: qty 10

## 2014-12-09 MED ORDER — FLUTICASONE PROPIONATE 50 MCG/ACT NA SUSP
2.0000 | Freq: Every day | NASAL | Status: DC | PRN
  Filled 2014-12-09: qty 16

## 2014-12-09 MED ORDER — SODIUM CHLORIDE 0.9 % IV BOLUS (SEPSIS)
1000.0000 mL | Freq: Once | INTRAVENOUS | Status: AC
  Administered 2014-12-09: 1000 mL via INTRAVENOUS

## 2014-12-09 MED ORDER — ENOXAPARIN SODIUM 40 MG/0.4ML ~~LOC~~ SOLN
40.0000 mg | SUBCUTANEOUS | Status: DC
  Administered 2014-12-09 – 2014-12-26 (×18): 40 mg via SUBCUTANEOUS
  Filled 2014-12-09 (×19): qty 0.4

## 2014-12-09 MED ORDER — SODIUM CHLORIDE 0.9 % IJ SOLN
3.0000 mL | Freq: Two times a day (BID) | INTRAMUSCULAR | Status: DC
  Administered 2014-12-09 – 2014-12-10 (×2): 3 mL via INTRAVENOUS

## 2014-12-09 MED ORDER — HALOPERIDOL 5 MG PO TABS
10.0000 mg | ORAL_TABLET | Freq: Two times a day (BID) | ORAL | Status: DC
  Filled 2014-12-09: qty 2

## 2014-12-09 MED ORDER — SODIUM CHLORIDE 0.9 % IV BOLUS (SEPSIS)
1500.0000 mL | Freq: Once | INTRAVENOUS | Status: AC
  Administered 2014-12-09: 1500 mL via INTRAVENOUS

## 2014-12-09 MED ORDER — DONEPEZIL HCL 10 MG PO TABS
10.0000 mg | ORAL_TABLET | Freq: Every day | ORAL | Status: DC
  Administered 2014-12-09: 10 mg via ORAL
  Filled 2014-12-09 (×2): qty 1

## 2014-12-09 MED ORDER — GABAPENTIN 600 MG PO TABS
600.0000 mg | ORAL_TABLET | Freq: Three times a day (TID) | ORAL | Status: DC
  Administered 2014-12-09 – 2014-12-10 (×3): 600 mg via ORAL
  Filled 2014-12-09 (×4): qty 1

## 2014-12-09 MED ORDER — ACETAMINOPHEN 325 MG PO TABS
650.0000 mg | ORAL_TABLET | Freq: Four times a day (QID) | ORAL | Status: DC | PRN
  Administered 2014-12-10 (×2): 650 mg via ORAL
  Filled 2014-12-09 (×3): qty 2

## 2014-12-09 MED ORDER — DEXTROSE 5 % IV SOLN
1.0000 g | INTRAVENOUS | Status: DC
  Filled 2014-12-09: qty 10

## 2014-12-09 MED ORDER — INSULIN ASPART 100 UNIT/ML ~~LOC~~ SOLN: 0.0000 [IU] | Freq: Three times a day (TID) | SUBCUTANEOUS | Status: DC

## 2014-12-09 MED ORDER — FENTANYL CITRATE (PF) 100 MCG/2ML IJ SOLN
50.0000 ug | Freq: Once | INTRAMUSCULAR | Status: AC
  Administered 2014-12-09: 50 ug via INTRAVENOUS
  Filled 2014-12-09: qty 2

## 2014-12-09 MED ORDER — DEXTROSE 5 % IV SOLN
500.0000 mg | Freq: Once | INTRAVENOUS | Status: AC
  Administered 2014-12-09: 500 mg via INTRAVENOUS
  Filled 2014-12-09: qty 500

## 2014-12-09 NOTE — Progress Notes (Signed)
INTERNAL MEDICINE TEACHING SERVICE Night Float Progress Note   Subjective:    Asked by day team to re-evaluate overnight. Patient resting comfortably in bed, states she feels somewhat improved from admission. Still slightly tremulous on exam, states she still feels febrile. No obvious SOB, no distress.    Objective:    BP 145/61 mmHg  Pulse 107  Temp(Src) 102 F (38.9 C) (Rectal)  Resp 18  Ht 5\' 3"  (1.6 m)  Wt 182 lb (82.555 kg)  BMI 32.25 kg/m2  SpO2 98%   Labs: Basic Metabolic Panel:    Component Value Date/Time   NA 140 12/09/2014 1426   K 4.3 12/09/2014 1426   CL 107 12/09/2014 1426   CO2 22 12/09/2014 1426   BUN 20 12/09/2014 1426   CREATININE 1.12* 12/09/2014 1426   GLUCOSE 93 12/09/2014 1426   CALCIUM 9.2 12/09/2014 1426    CBC:    Component Value Date/Time   WBC 19.3* 12/09/2014 1426   WBC 8.9 05/05/2009 1126   HGB 9.0* 12/09/2014 1426   HGB 11.0* 05/05/2009 1126   HCT 28.1* 12/09/2014 1426   HCT 35.0 05/05/2009 1126   PLT 391 12/09/2014 1426   PLT 229 05/05/2009 1126   MCV 96.6 12/09/2014 1426   MCV 95.1 05/05/2009 1126   NEUTROABS 16.0* 12/09/2014 1426   NEUTROABS 5.5 05/05/2009 1126   LYMPHSABS 1.6 12/09/2014 1426   LYMPHSABS 2.9 05/05/2009 1126   MONOABS 1.7* 12/09/2014 1426   MONOABS 0.5 05/05/2009 1126   EOSABS 0.0 12/09/2014 1426   EOSABS 0.0 05/05/2009 1126   BASOSABS 0.0 12/09/2014 1426   BASOSABS 0.0 05/05/2009 1126    Cardiac Enzymes: Lab Results  Component Value Date   CKTOTAL 172 12/25/2010   TROPONINI <0.30 07/15/2011    Physical Exam: General: Vital signs reviewed and noted. Well-developed, well-nourished, in no acute distress; alert, appropriate and cooperative throughout examination. Tremulous/chills.   Lungs:  Normal respiratory effort. Scattered rhonchi. Faint wheezes anteriorly.   Heart: Tachycardia. S1 and S2 normal without gallop, murmur, or rubs.  Abdomen:  BS normoactive. Soft, Nondistended, non-tender.  No masses  or organomegaly.  Extremities: No pretibial edema.     Assessment/ Plan:    74 y/o F admitted w/ CAP and obvious LUL infiltrate on CXR. Still w/ some mild rigors and chills, however, patient states she is feeling better. Given Rocephin + Azithro in the ED. Some concern given lactic acidosis of 3.56 this afternoon, resolved w/ IVF, now 1.9.  -Continue IVF; NS @ 125 cc/hr for now -Supplemental O2 prn -Tylenol prn for fever -ABx as above -Will continue to monitor   Corky Sox, MD  12/09/2014, 11:35 PM

## 2014-12-09 NOTE — ED Provider Notes (Signed)
CSN: [id number]     Arrival date & time 12/09/14  30 History   First MD Initiated Contact with Patient 12/09/14 1309     Chief Complaint  Patient presents with  . Weakness     (Consider location/radiation/quality/duration/timing/severity/associated sxs/prior Treatment) HPI   Patient is a 74 year old female past medical history of CAD, hypertension, anemia, DM,  she presents to the ER via EMS with complaints of dizziness and weakness for the past 2 days.  At her baseline she is able to ambulate around her home with a cane, but over the past 2 days she has been unable to get around well, stating her legs "give out on me."  She denies falls, syncope, or one-sided weakness.  She has had decreased food and fluid intake, complains of a recent sinus infection which was apparently treated, but she has had a persistent, non-productive cough for a month with coughing fits that are worse at night with 1-pillow orthopnea (her baseline) and lower extremity edema.  She denies wheeze, exertional SOB, CP, abdominal pain, dysuria, confusion.  She has back pain, which is chronic.  Had diarrhea two days ago, denies melena, hematochezia.  Past Medical History  Diagnosis Date  . Anemia, iron deficiency   . Anxiety   . CAD (coronary artery disease)   . Type II or unspecified type diabetes mellitus without mention of complication, not stated as uncontrolled   . GERD (gastroesophageal reflux disease)   . HTN (hypertension)   . Peripheral neuropathy   . Schizophrenia   . Hyperlipidemia   . Allergic rhinitis   . 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 Dementia   . IBS (irritable bowel syndrome)   . Syncope     EP study 1996 w/atrial tachycardia  . Esophageal motility disorder   . Renal insufficiency    Past Surgical History  Procedure Laterality Date  . Abdominal hysterectomy    . Ptca  1995  . Knee arthroplasty      Right   . Breast biopsy      Negative   Family  History  Problem Relation Age of Onset  . Schizophrenia Neg Hx   . Coronary artery disease Neg Hx   . Colon cancer Neg Hx   . Breast cancer Neg Hx   . Hypertension Other   . Diabetes Other    Social History  Substance Use Topics  . Smoking status: Former Research scientist (life sciences)  . Smokeless tobacco: Never Used  . Alcohol Use: No   OB History    No data available     Review of Systems  Constitutional: Positive for chills, activity change and fatigue. Negative for fever and appetite change.  HENT: Positive for congestion. Negative for ear pain and sore throat.   Eyes: Negative.   Respiratory: Positive for cough. Negative for chest tightness, shortness of breath, wheezing and stridor.   Cardiovascular: Positive for leg swelling. Negative for chest pain and palpitations.  Gastrointestinal: Positive for diarrhea. Negative for nausea, vomiting, abdominal pain and constipation.  Endocrine: Negative.   Genitourinary: Negative.   Musculoskeletal: Positive for back pain.  Neurological: Positive for weakness. Negative for dizziness, tremors, seizures, syncope and facial asymmetry.  Psychiatric/Behavioral: Negative.       Allergies  Codeine phosphate; Glimepiride; Glyburide; Shellfish allergy; and Tramadol  Home Medications   Prior to Admission medications   Medication Sig Start Date End Date Taking? Authorizing Provider  acetaminophen (TYLENOL) 500 MG tablet Take 500-1,000 mg  by mouth 3 (three) times daily as needed. For back pain    Yes Historical Provider, MD  amitriptyline (ELAVIL) 25 MG tablet Take 1 tablet (25 mg total) by mouth at bedtime. 11/06/10  Yes Neena Rhymes, MD  amLODipine (NORVASC) 10 MG tablet Take 1 tablet (10 mg total) by mouth daily. 10/09/12  Yes Neena Rhymes, MD  aspirin 325 MG tablet Take 325 mg by mouth daily.     Yes Historical Provider, MD  benztropine (COGENTIN) 2 MG tablet Take 2 mg by mouth every morning.   Yes Historical Provider, MD  clonazePAM (KLONOPIN) 0.5  MG tablet Take 0.25 mg by mouth at bedtime.   Yes Historical Provider, MD  cloZAPine (CLOZARIL) 100 MG tablet Take 200 mg by mouth at bedtime.    Yes Historical Provider, MD  donepezil (ARICEPT) 10 MG tablet Take 10 mg by mouth at bedtime.   Yes Historical Provider, MD  ferrous sulfate 325 (65 FE) MG tablet Take 325 mg by mouth daily with breakfast.   Yes Historical Provider, MD  fluticasone (FLONASE) 50 MCG/ACT nasal spray Place 2 sprays into both nostrils daily as needed for allergies.  02/22/13  Yes Historical Provider, MD  furosemide (LASIX) 40 MG tablet Take 0.5 tablets (20 mg total) by mouth daily. For systolic blood pressure management & fluid accumulation. 05/04/12  Yes Neena Rhymes, MD  haloperidol (HALDOL) 10 MG tablet Take 10 mg by mouth 2 (two) times daily.    Yes Historical Provider, MD  insulin aspart protamine- aspart (NOVOLOG MIX 70/30) (70-30) 100 UNIT/ML injection Inject 25 Units into the skin 2 (two) times daily.   Yes Historical Provider, MD  insulin glargine (LANTUS SOLOSTAR) 100 UNIT/ML injection Inject 15 Units into the skin at bedtime. 03/21/11  Yes Neena Rhymes, MD  levocetirizine (XYZAL) 5 MG tablet Take 5 mg by mouth every evening.  03/05/13  Yes Historical Provider, MD  memantine (NAMENDA) 10 MG tablet Take 1 tablet (10 mg total) by mouth 2 (two) times daily. 10/09/12  Yes Neena Rhymes, MD  Multiple Vitamins-Minerals (MULTIVITAMIN,TX-MINERALS) tablet Take 1 tablet by mouth daily.     Yes Historical Provider, MD  NEXIUM 40 MG capsule Take 40 mg by mouth daily.  03/05/13  Yes Historical Provider, MD  pioglitazone (ACTOS) 30 MG tablet Take 1 tablet (30 mg total) by mouth daily. 05/21/12  Yes Neena Rhymes, MD  potassium chloride (KLOR-CON) 8 MEQ tablet Take 1 tablet (8 mEq total) by mouth daily. 04/28/12  Yes Neena Rhymes, MD  ramipril (ALTACE) 10 MG capsule Take 1 capsule (10 mg total) by mouth daily. 10/09/12  Yes Neena Rhymes, MD  rosuvastatin (CRESTOR) 20  MG tablet Take 1 tablet (20 mg total) by mouth daily. 10/09/12  Yes Neena Rhymes, MD  gabapentin (NEURONTIN) 600 MG tablet Take 1 tablet (600 mg total) by mouth 3 (three) times daily. 04/28/12 04/28/13  Neena Rhymes, MD   BP 145/61 mmHg  Pulse 107  Temp(Src) 102 F (38.9 C) (Rectal)  Resp 18  Ht 5\' 3"  (1.6 m)  Wt 182 lb (82.555 kg)  BMI 32.25 kg/m2  SpO2 98% Physical Exam  Constitutional: She is oriented to person, place, and time. She appears well-developed and well-nourished. She is cooperative. She is easily aroused. No distress.  Elderly female, appears ill but nontoxic, NAD  HENT:  Head: Normocephalic and atraumatic.  Right Ear: External ear normal.  Left Ear: External ear normal.  Nose: Nose  normal.  Mouth/Throat: Uvula is midline. Mucous membranes are not pale, dry and not cyanotic. No oropharyngeal exudate, posterior oropharyngeal edema or posterior oropharyngeal erythema.  Right ear, normal tympanic membrane, no erythema or edema Left ear, TM partially occluded by cerumen, visible portion of TM is pearly gray translucent with visible landmarks, no erythema Nasal mucosa pink without discharge Oral mucosa dry but tongue matted, no post interior oropharynx edema or erythema  Eyes: Conjunctivae and EOM are normal. Pupils are equal, round, and reactive to light. Right eye exhibits no discharge. Left eye exhibits no discharge. No scleral icterus.  Neck: Normal range of motion. No JVD present. No tracheal deviation present. No thyromegaly present.  Cardiovascular: Regular rhythm and intact distal pulses.  Tachycardia present.  Exam reveals no gallop and no friction rub.   No murmur heard. Pulses:      Radial pulses are 2+ on the right side, and 2+ on the left side.  Bilateral lower extremity pretibial pitting edema 1+ No carotid bruit bilaterally   Pulmonary/Chest: Effort normal. No accessory muscle usage. No respiratory distress. She has decreased breath sounds. She has no  wheezes. She exhibits no tenderness.  Diffuse crackles with posterior auscultation  Abdominal: Soft. Normal appearance and bowel sounds are normal. She exhibits no distension and no mass. There is no tenderness. There is no rigidity, no rebound and no guarding.  Genitourinary: Rectum normal. Rectal exam shows no external hemorrhoid, no internal hemorrhoid, no fissure, no mass, no tenderness and anal tone normal. Guaiac negative stool.  Musculoskeletal: Normal range of motion. She exhibits no edema or tenderness.  Lymphadenopathy:    She has no cervical adenopathy.  Neurological: She is alert, oriented to person, place, and time and easily aroused. She has normal reflexes. She displays no tremor. No cranial nerve deficit. She exhibits normal muscle tone. She displays no seizure activity.  Mental Status:  Alert, oriented, thought content appropriate, able to give a coherent history, though generally slowed. Speech fluent without evidence of aphasia. Able to follow 2 step commands without difficulty.  Cranial Nerves:  II:  pupils equal, round, reactive to light III,IV, VI: ptosis not present, extra-ocular motions intact bilaterally  V,VII: smile symmetric, facial light touch sensation equal VIII: hearing grossly normal to voice  X: uvula elevates symmetrically  XI: bilateral shoulder shrug symmetric XII: midline tongue extension without fassiculations Motor:  Normal tone. 4/5 in upper and lower extremities bilaterally including strong and equal grip strength and dorsiflexion/plantar flexion Normal sensation to light touch in all extremities  Skin: Skin is warm and dry. No rash noted. She is not diaphoretic. No erythema. No pallor.  Psychiatric: She has a normal mood and affect. Her behavior is normal. Judgment and thought content normal.  Nursing note and vitals reviewed.   ED Course  Procedures (including critical care time) Labs Review Labs Reviewed  COMPREHENSIVE METABOLIC PANEL -  Abnormal; Notable for the following:    Creatinine, Ser 1.12 (*)    Total Protein 6.3 (*)    Albumin 2.1 (*)    GFR calc non Af Amer 47 (*)    GFR calc Af Amer 55 (*)    All other components within normal limits  URINALYSIS, ROUTINE W REFLEX MICROSCOPIC (NOT AT [facility]) - Abnormal; Notable for the following:    Protein, ur 30 (*)    Leukocytes, UA TRACE (*)    All other components within normal limits  CBC WITH DIFFERENTIAL/PLATELET - Abnormal; Notable for the following:    WBC  19.3 (*)    RBC 2.91 (*)    Hemoglobin 9.0 (*)    HCT 28.1 (*)    RDW 15.6 (*)    Neutrophils Relative % 83 (*)    Neutro Abs 16.0 (*)    Lymphocytes Relative 8 (*)    Monocytes Absolute 1.7 (*)    All other components within normal limits  I-STAT CG4 LACTIC ACID, ED - Abnormal; Notable for the following:    Lactic Acid, Venous 2.46 (*)    All other components within normal limits  I-STAT CG4 LACTIC ACID, ED - Abnormal; Notable for the following:    Lactic Acid, Venous 3.56 (*)    All other components within normal limits  CBG MONITORING, ED - Abnormal; Notable for the following:    Glucose-Capillary 129 (*)    All other components within normal limits  CULTURE, BLOOD (ROUTINE X 2)  CULTURE, BLOOD (ROUTINE X 2)  URINE CULTURE  URINE CULTURE  CULTURE, BLOOD (ROUTINE X 2)  CULTURE, BLOOD (ROUTINE X 2)  CULTURE, EXPECTORATED SPUTUM-ASSESSMENT  MRSA PCR SCREENING  BRAIN NATRIURETIC PEPTIDE  URINE MICROSCOPIC-ADD ON  LACTIC ACID, PLASMA  HIV ANTIBODY (ROUTINE TESTING)  LEGIONELLA ANTIGEN, URINE  STREP PNEUMONIAE URINARY ANTIGEN  CBC WITH DIFFERENTIAL/PLATELET  COMPREHENSIVE METABOLIC PANEL  I-STAT CG4 LACTIC ACID, ED  I-STAT TROPOININ, ED  CBG MONITORING, ED  POC OCCULT BLOOD, ED  I-STAT CG4 LACTIC ACID, ED    Imaging Review Dg Chest Port 1 View  12/09/2014   CLINICAL DATA:  Weakness and cough for 1 month.  EXAM: PORTABLE CHEST - 1 VIEW  COMPARISON:  12/25/2010  FINDINGS: There is extensive  airspace disease throughout the left perihilar region and medial left upper lung. Few streaky densities in the right perihilar region. Mild elevation of the right hemidiaphragm. Heart size is within normal limits and stable.  IMPRESSION: Extensive airspace disease in the medial left upper lung and left perihilar region. There are additional densities in the right perihilar region and right lower lung. Findings are concerning for pneumonia.  Followup PA and lateral chest X-ray is recommended in 3-4 weeks following trial of antibiotic therapy to ensure resolution and exclude underlying malignancy.   Electronically Signed   By: Markus Daft M.D.   On: 12/09/2014 14:52   I have personally reviewed and evaluated these images and lab results as part of my medical decision-making.   EKG Interpretation None      MDM   Final diagnoses:  Community acquired pneumonia  Sepsis, due to unspecified organism    Patient with weakness, broad workup, she is alert and oriented 3 She has rales on exam, pulse ox has been between 87 and 92 while I was in the exam room with her, she does not have any COPD diagnosis, so I have put her on 2 L nasal cannula UA is pending to rule out UTI, HEENT appears normal, no abdominal or chest pain  Chest x-ray positive for pneumonia, still pending urinalysis  Patient's labs are significant for leukocytosis - WBC 19.3, negative lactic acid, negative trop, BNP normal, BCx2 already obtained, mildly elevated creatinine, anemia (no recent labs to compare to)  IV azithro/ceftriaxone Still pending - rectal temp, heme occult, in and out cath Call for admission was to unassigned with return phone call from Dr. Redmond Pulling who will admit to tele for PNA, she is aware of pending items  Upon reexam pt has been shivering, curled in fetal position and is tachycardic to 114.  Pt  has multiple IVs, but the fluid bolus has had very little volume given due to the positional IVs when the patient bends  her arms.  The nurse and I have repositioned her multiple times. She has had some tachypnea that she states is from the pain in her back.     Rectal temp and in and out catheter for urinalysis was obtained at 1645, patient is febrile to 102. She has been tachycardic to 120s, and has a hypertensive blood pressure recorded 505 systolic, however this was obtained and the patient was shaking and flexed and her arms. I have repeated blood pressure while in the room and she has been found to be 136/89.   Sepsis protocol was initiated with a rectal temp of 102, tachycardia, leukocytosis, another lactic acid will be obtained. Her blood pressure has been stable, no hypotension and patient has maintained mentation, is alert and oriented 3, though does appear worse than her initial presentation, and is still weak and sleepy, she has been maintaining her airway with sats 97-100% on 2L.  The RN has moved fluid bolus to alternate IV's, and has started abx for CAP.   Patient now had a positive lactic acid, 2.46 that resulted at 1707.    Pt will be admitted for CAP, sepsis, by Dr. Redmond Pulling, going to step-down.     Delsa Grana, PA-C 12/12/14 [id number]  Elnora Morrison, MD 12/12/14 [phone]

## 2014-12-09 NOTE — H&P (Signed)
Date: 12/09/2014               Patient Name:  [name] MRN: [id number]  DOB: April 13, 1941 Age / Sex: 74 y.o., female   PCP: Provider Not In System         Medical Service: Internal Medicine Teaching Service         Attending Physician: Dr. Axel Filler, MD    First Contact: Dr. Loleta Chance Pager: [phone]  Second Contact: Dr. Duwaine Maxin Pager: [phone]       After Hours (After 5p/  First Contact Pager: [phone]  weekends / holidays): Second Contact Pager: [phone]   Chief Complaint: "I've been coughing and shaking."  History of Present Illness: Ms. Ferrucci is a 74 year-old lady with history of CAD (specifics unknown), hypertension, type II diabetes, hyperlipidemia, and schizophrenia who presented with weakness, cough, fever, and diarrhea for the last week. She says she hasn't eaten much over the last two days but is hungry now. She lives in an apartment by herself and has had difficulty getting around because her legs have felt weak but she says she hasn't fallen. She denies chest pain, dysuria, confusion, or pain elsewhere.  In the emergency department, she was febrile to 102, tachycardic to 130s, pressures stable at 150s. She has a leukocytosis of 19 and left upper lobe consolidation was seen on chest x-ray. She was started on ceftriaxone and azithromycin for community acquired pneumonia, given 2L bolus of IV fluids, and admitted.   Meds: Current Facility-Administered Medications  Medication Dose Route Frequency Provider Last Rate Last Dose  . azithromycin (ZITHROMAX) 500 mg in dextrose 5 % 250 mL IVPB  500 mg Intravenous Once Delsa Grana, PA-C 250 mL/hr at 12/09/14 1700 500 mg at 12/09/14 1700   Current Outpatient Prescriptions  Medication Sig Dispense Refill  . acetaminophen (TYLENOL) 500 MG tablet Take 500-1,000 mg by mouth 3 (three) times daily as needed. For back pain     . amitriptyline (ELAVIL) 25 MG tablet Take 1 tablet (25 mg total) by mouth at bedtime. 30  tablet 11  . amLODipine (NORVASC) 10 MG tablet Take 1 tablet (10 mg total) by mouth daily. 30 tablet 0  . aspirin 325 MG tablet Take 325 mg by mouth daily.      . benztropine (COGENTIN) 2 MG tablet Take 2 mg by mouth every morning.    . clonazePAM (KLONOPIN) 0.5 MG tablet Take 0.25 mg by mouth at bedtime.    . cloZAPine (CLOZARIL) 100 MG tablet Take 200 mg by mouth at bedtime.     . donepezil (ARICEPT) 10 MG tablet Take 10 mg by mouth at bedtime.    . ferrous sulfate 325 (65 FE) MG tablet Take 325 mg by mouth daily with breakfast.    . fluticasone (FLONASE) 50 MCG/ACT nasal spray Place 2 sprays into both nostrils daily as needed for allergies.     . furosemide (LASIX) 40 MG tablet Take 0.5 tablets (20 mg total) by mouth daily. For systolic blood pressure management & fluid accumulation. 30 tablet 5  . gabapentin (NEURONTIN) 600 MG tablet Take 1 tablet (600 mg total) by mouth 3 (three) times daily. 90 tablet 5  . haloperidol (HALDOL) 10 MG tablet Take 10 mg by mouth 2 (two) times daily.     . insulin aspart protamine- aspart (NOVOLOG MIX 70/30) (70-30) 100 UNIT/ML injection Inject 25 Units into the skin 2 (two) times daily.    . insulin glargine (LANTUS SOLOSTAR) 100  UNIT/ML injection Inject 15 Units into the skin at bedtime. 15 mL 6  . levocetirizine (XYZAL) 5 MG tablet Take 5 mg by mouth every evening.     . memantine (NAMENDA) 10 MG tablet Take 1 tablet (10 mg total) by mouth 2 (two) times daily. 60 tablet 0  . Multiple Vitamins-Minerals (MULTIVITAMIN,TX-MINERALS) tablet Take 1 tablet by mouth daily.      Marland Kitchen NEXIUM 40 MG capsule Take 40 mg by mouth daily.     . pioglitazone (ACTOS) 30 MG tablet Take 1 tablet (30 mg total) by mouth daily. 30 tablet 1  . potassium chloride (KLOR-CON) 8 MEQ tablet Take 1 tablet (8 mEq total) by mouth daily. 30 tablet 6  . ramipril (ALTACE) 10 MG capsule Take 1 capsule (10 mg total) by mouth daily. 30 capsule 0  . rosuvastatin (CRESTOR) 20 MG tablet Take 1 tablet  (20 mg total) by mouth daily. 30 tablet 0    Allergies: Allergies as of 12/09/2014 - Review Complete 12/09/2014  Allergen Reaction Noted  . Codeine phosphate Other (See Comments) 09/04/2006  . Glimepiride Other (See Comments) 09/04/2006  . Glyburide Other (See Comments) 09/04/2006  . Shellfish allergy Swelling 03/23/2013  . Tramadol Other (See Comments) 11/09/2010   Past Medical History  Diagnosis Date  . Anemia, iron deficiency   . Anxiety   . CAD (coronary artery disease)   . Type II or unspecified type diabetes mellitus without mention of complication, not stated as uncontrolled   . GERD (gastroesophageal reflux disease)   . HTN (hypertension)   . Peripheral neuropathy   . Schizophrenia   . Hyperlipidemia   . Allergic rhinitis   . 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 Dementia   . IBS (irritable bowel syndrome)   . Syncope     EP study 1996 w/atrial tachycardia  . Esophageal motility disorder   . Renal insufficiency    Past Surgical History  Procedure Laterality Date  . Abdominal hysterectomy    . Ptca  1995  . Knee arthroplasty      Right   . Breast biopsy      Negative   Family History  Problem Relation Age of Onset  . Schizophrenia Neg Hx   . Coronary artery disease Neg Hx   . Colon cancer Neg Hx   . Breast cancer Neg Hx   . Hypertension Other   . Diabetes Other    Social History   Social History  . Marital Status: Single    Spouse Name: N/A  . Number of Children: N/A  . Years of Education: N/A   Occupational History  . Not on file.   Social History Main Topics  . Smoking status: Former Research scientist (life sciences)  . Smokeless tobacco: Never Used  . Alcohol Use: No  . Drug Use: No  . Sexual Activity: Not on file   Other Topics Concern  . Not on file   Social History Narrative   11th grade education  Never married  5 children: 4 daughters, 1 son, 7 grandchildren  Lives alone  Has supportive family                Review of  Systems  Constitutional: Positive for fever, chills and malaise/fatigue.  Eyes: Negative for blurred vision and double vision.  Respiratory: Positive for cough, sputum production and shortness of breath. Negative for hemoptysis.   Cardiovascular: Negative for chest pain, palpitations, orthopnea and leg swelling.  Gastrointestinal:  Positive for abdominal pain and diarrhea. Negative for heartburn, nausea and vomiting.  Skin: Negative for rash.  Neurological: Positive for weakness. Negative for headaches.    Physical Exam: Blood pressure 166/89, pulse 122, temperature 102 F (38.9 C), temperature source Rectal, resp. rate 16, height 5\' 3"  (1.6 m), weight 82.555 kg (182 lb), SpO2 94 %. General: Lying in bed shivering HEENT: No scleral icterus Cardiac: Tachycardic but regular, no rubs, murmurs or gallops Pulm: Crackles in left upper lobe, no bibasilar crackles appreciated. Satting 100% on room but slightly increased work of breathing. Abd: soft, nontender, nondistended, BS present Ext: warm and well perfused, no pedal edema  Lab results: Basic Metabolic Panel:  Recent Labs  12/09/14 1426  NA 140  K 4.3  CL 107  CO2 22  GLUCOSE 93  BUN 20  CREATININE 1.12*  CALCIUM 9.2   Liver Function Tests:  Recent Labs  12/09/14 1426  AST 29  ALT 24  ALKPHOS 109  BILITOT 0.3  PROT 6.3*  ALBUMIN 2.1*  CBC:  Recent Labs  12/09/14 1426  WBC 19.3*  NEUTROABS 16.0*  HGB 9.0*  HCT 28.1*  MCV 96.6  PLT 391   Imaging results:  Dg Chest Port 1 View  12/09/2014   CLINICAL DATA:  Weakness and cough for 1 month.  EXAM: PORTABLE CHEST - 1 VIEW  COMPARISON:  12/25/2010  FINDINGS: There is extensive airspace disease throughout the left perihilar region and medial left upper lung. Few streaky densities in the right perihilar region. Mild elevation of the right hemidiaphragm. Heart size is within normal limits and stable.  IMPRESSION: Extensive airspace disease in the medial left upper lung  and left perihilar region. There are additional densities in the right perihilar region and right lower lung. Findings are concerning for pneumonia.  Followup PA and lateral chest X-ray is recommended in 3-4 weeks following trial of antibiotic therapy to ensure resolution and exclude underlying malignancy.   Electronically Signed   By: Markus Daft M.D.   On: 12/09/2014 14:52    Other results: EKG: NSR, normal axis, right atrial enlargement, no ST changes, good R wave progression  Assessment & Plan by Problem:  Community acquired pneumonia: Fever, dyspnea, productive cough, hemodynamically stable with left upper lobe consolidation on chest x-ray, lactate went from 1 to 2.5 to 3.5 within 5 hours. She was slow to respond and shivering uncontrollably so it was difficult to obtain a thorough history. She lives in an apartment and has not been hospitalized recently. I tried calling her son, Coralyn Pear, and left a message for him to call me back; I'll try again tomorrow. Will continue antibiotics, acetaminophen, and follow blood and sputum cultures. -Ceftriaxone and azithromycin for CAP -Acetaminophen for fever -Continue IV fluid boluses per sepsis protocol -Follow-up blood and sputum cultures -Follow-up HIV   Anemia: Hgb 9.0 on admission, normocytic, baseline around 10-12. FOBT was negative. -Repeat CBC in morning  Hypertension: In ED, 150s/90s. She is on amlodipine 10mg  and ramipril 10mg  daily -Holding furosemide while we give IV boluses  Type II diabetes mellitus: Last a1c 7.7 in 2012. Sugars 90s while inpatient. On 25U novolog BID and 15U lantus QHS at home. -5 U Lantus QHS -SSI  Schizophrenia: I discussed her drug regiment with pharmacy who recommended continuing her laundry list of psychotropics. Her QT is normal. -Continue haldol, clozapine, amitriptyline, benztropine, donepezil, memantine  Dispo: Disposition is deferred at this time, awaiting improvement of current medical problems.  Contact  information: Her son Daryl's  phone number is [phone].  The patient does not have a current PCP (Provider Not In System) and does need an [facility] hospital follow-up appointment after discharge.  The patient does not know have transportation limitations that hinder transportation to clinic appointments.  Signed: Loleta Chance, MD 12/09/2014, 5:33 PM

## 2014-12-09 NOTE — ED Notes (Signed)
FSBS 70

## 2014-12-09 NOTE — ED Notes (Addendum)
Pt has complained of dizziness and weakness X 2 days.  Pt was recently diagnosed with a sinus infection. Pts aide called EMS for further follow up.  VS are as follows: BP: 122/58 HR: 96 SPO2:93% on RA CBG: 126

## 2014-12-10 ENCOUNTER — Inpatient Hospital Stay (HOSPITAL_COMMUNITY): Payer: Medicare Other

## 2014-12-10 DIAGNOSIS — A419 Sepsis, unspecified organism: Principal | ICD-10-CM

## 2014-12-10 DIAGNOSIS — IMO0001 Reserved for inherently not codable concepts without codable children: Secondary | ICD-10-CM | POA: Insufficient documentation

## 2014-12-10 DIAGNOSIS — J80 Acute respiratory distress syndrome: Secondary | ICD-10-CM

## 2014-12-10 DIAGNOSIS — J9601 Acute respiratory failure with hypoxia: Secondary | ICD-10-CM | POA: Diagnosis not present

## 2014-12-10 DIAGNOSIS — G9340 Encephalopathy, unspecified: Secondary | ICD-10-CM

## 2014-12-10 DIAGNOSIS — J189 Pneumonia, unspecified organism: Secondary | ICD-10-CM

## 2014-12-10 DIAGNOSIS — E118 Type 2 diabetes mellitus with unspecified complications: Secondary | ICD-10-CM

## 2014-12-10 DIAGNOSIS — G259 Extrapyramidal and movement disorder, unspecified: Secondary | ICD-10-CM | POA: Insufficient documentation

## 2014-12-10 LAB — BLOOD GAS, ARTERIAL
ACID-BASE DEFICIT: 6.6 mmol/L — AB (ref 0.0–2.0)
Acid-base deficit: 4.2 mmol/L — ABNORMAL HIGH (ref 0.0–2.0)
Acid-base deficit: 4.2 mmol/L — ABNORMAL HIGH (ref 0.0–2.0)
BICARBONATE: 19.9 meq/L — AB (ref 20.0–24.0)
BICARBONATE: 19.5 meq/L — AB (ref 20.0–24.0)
BICARBONATE: 19.1 meq/L — AB (ref 20.0–24.0)
DRAWN BY: 418751
DRAWN BY: 24513
Drawn by: 43707
FIO2: 0.28
FIO2: 1
FIO2: 0.7
O2 SAT: 87.4 %
O2 SAT: 92.3 %
O2 SAT: 96 %
PATIENT TEMPERATURE: 104
PCO2 ART: 49.7 mmHg — AB (ref 35.0–45.0)
PEEP/CPAP: 12 cmH2O
PH ART: 7.393 (ref 7.350–7.450)
PH ART: 7.228 — AB (ref 7.350–7.450)
PO2 ART: 57.4 mmHg — AB (ref 80.0–100.0)
PO2 ART: 68.4 mmHg — AB (ref 80.0–100.0)
Patient temperature: 98.6
Patient temperature: 101.4
RATE: 24 resp/min
TCO2: 20.4 mmol/L (ref 0–100)
TCO2: 20.5 mmol/L (ref 0–100)
TCO2: 20.9 mmol/L (ref 0–100)
VT: 300 mL
pCO2 arterial: 33.5 mmHg — ABNORMAL LOW (ref 35.0–45.0)
pCO2 arterial: 33.4 mmHg — ABNORMAL LOW (ref 35.0–45.0)
pH, Arterial: 7.391 (ref 7.350–7.450)
pO2, Arterial: 123 mmHg — ABNORMAL HIGH (ref 80.0–100.0)

## 2014-12-10 LAB — GLUCOSE, CAPILLARY
GLUCOSE-CAPILLARY: 95 mg/dL (ref 65–99)
GLUCOSE-CAPILLARY: 94 mg/dL (ref 65–99)
GLUCOSE-CAPILLARY: 133 mg/dL — AB (ref 65–99)
Glucose-Capillary: 83 mg/dL (ref 65–99)
Glucose-Capillary: 61 mg/dL — ABNORMAL LOW (ref 65–99)
Glucose-Capillary: 113 mg/dL — ABNORMAL HIGH (ref 65–99)

## 2014-12-10 LAB — STREP PNEUMONIAE URINARY ANTIGEN: Strep Pneumo Urinary Antigen: NEGATIVE

## 2014-12-10 LAB — COMPREHENSIVE METABOLIC PANEL
ALT: 22 U/L (ref 14–54)
ANION GAP: 7 (ref 5–15)
AST: 29 U/L (ref 15–41)
Albumin: 1.9 g/dL — ABNORMAL LOW (ref 3.5–5.0)
Alkaline Phosphatase: 107 U/L (ref 38–126)
BILIRUBIN TOTAL: 0.3 mg/dL (ref 0.3–1.2)
BUN: 15 mg/dL (ref 6–20)
CHLORIDE: 115 mmol/L — AB (ref 101–111)
CO2: 20 mmol/L — ABNORMAL LOW (ref 22–32)
Calcium: 8.3 mg/dL — ABNORMAL LOW (ref 8.9–10.3)
Creatinine, Ser: 0.98 mg/dL (ref 0.44–1.00)
GFR, EST NON AFRICAN AMERICAN: 55 mL/min — AB (ref >60)
Glucose, Bld: 116 mg/dL — ABNORMAL HIGH (ref 65–99)
POTASSIUM: 4.1 mmol/L (ref 3.5–5.1)
Sodium: 142 mmol/L (ref 135–145)
TOTAL PROTEIN: 6 g/dL — AB (ref 6.5–8.1)

## 2014-12-10 LAB — CBC WITH DIFFERENTIAL/PLATELET
BASOS ABS: 0 10*3/uL (ref 0.0–0.1)
Basophils Relative: 0 % (ref 0–1)
EOS PCT: 0 % (ref 0–5)
Eosinophils Absolute: 0.1 10*3/uL (ref 0.0–0.7)
HEMATOCRIT: 29.1 % — AB (ref 36.0–46.0)
Hemoglobin: 9.2 g/dL — ABNORMAL LOW (ref 12.0–15.0)
LYMPHS ABS: 2.2 10*3/uL (ref 0.7–4.0)
LYMPHS PCT: 11 % — AB (ref 12–46)
MCH: 31.1 pg (ref 26.0–34.0)
MCHC: 31.6 g/dL (ref 30.0–36.0)
MCV: 98.3 fL (ref 78.0–100.0)
MONO ABS: 1.8 10*3/uL — AB (ref 0.1–1.0)
MONOS PCT: 9 % (ref 3–12)
NEUTROS ABS: 16 10*3/uL — AB (ref 1.7–7.7)
Neutrophils Relative %: 80 % — ABNORMAL HIGH (ref 43–77)
PLATELETS: 375 10*3/uL (ref 150–400)
RBC: 2.96 MIL/uL — ABNORMAL LOW (ref 3.87–5.11)
RDW: 15.7 % — AB (ref 11.5–15.5)
WBC: 20.1 10*3/uL — ABNORMAL HIGH (ref 4.0–10.5)

## 2014-12-10 LAB — TRIGLYCERIDES: Triglycerides: 61 mg/dL (ref <150)

## 2014-12-10 LAB — MRSA PCR SCREENING: MRSA BY PCR: NEGATIVE

## 2014-12-10 LAB — HIV ANTIBODY (ROUTINE TESTING W REFLEX): HIV SCREEN 4TH GENERATION: NONREACTIVE

## 2014-12-10 MED ORDER — SODIUM CHLORIDE 0.9 % IV SOLN
INTRAVENOUS | Status: DC
  Administered 2014-12-10: 17:00:00 via INTRAVENOUS

## 2014-12-10 MED ORDER — ASPIRIN 325 MG PO TABS
325.0000 mg | ORAL_TABLET | Freq: Every day | ORAL | Status: DC
  Administered 2014-12-11 – 2014-12-22 (×11): 325 mg
  Filled 2014-12-10 (×12): qty 1

## 2014-12-10 MED ORDER — ROCURONIUM BROMIDE 50 MG/5ML IV SOLN
60.0000 mg | Freq: Once | INTRAVENOUS | Status: AC
  Administered 2014-12-10: 60 mg via INTRAVENOUS

## 2014-12-10 MED ORDER — ETOMIDATE 2 MG/ML IV SOLN
20.0000 mg | Freq: Once | INTRAVENOUS | Status: AC
  Administered 2014-12-10: 20 mg via INTRAVENOUS

## 2014-12-10 MED ORDER — LACTATED RINGERS IV SOLN
INTRAVENOUS | Status: DC
  Administered 2014-12-10: 23:00:00 via INTRAVENOUS

## 2014-12-10 MED ORDER — PANTOPRAZOLE SODIUM 40 MG IV SOLR
40.0000 mg | Freq: Every day | INTRAVENOUS | Status: DC
  Administered 2014-12-10 – 2014-12-21 (×12): 40 mg via INTRAVENOUS
  Filled 2014-12-10 (×13): qty 40

## 2014-12-10 MED ORDER — FENTANYL CITRATE (PF) 100 MCG/2ML IJ SOLN
50.0000 ug | INTRAMUSCULAR | Status: DC | PRN
  Administered 2014-12-11: 50 ug via INTRAVENOUS
  Filled 2014-12-10: qty 2

## 2014-12-10 MED ORDER — PROPOFOL 1000 MG/100ML IV EMUL
0.0000 ug/kg/min | INTRAVENOUS | Status: DC
Start: 2014-12-10 — End: 2014-12-11
  Administered 2014-12-10: 40 ug/kg/min via INTRAVENOUS
  Administered 2014-12-11: 45 ug/kg/min via INTRAVENOUS
  Administered 2014-12-11: 35 ug/kg/min via INTRAVENOUS
  Filled 2014-12-10 (×3): qty 100

## 2014-12-10 MED ORDER — MIDAZOLAM HCL 2 MG/2ML IJ SOLN
INTRAMUSCULAR | Status: AC
  Filled 2014-12-10: qty 2

## 2014-12-10 MED ORDER — NALOXONE HCL 0.4 MG/ML IJ SOLN
0.4000 mg | Freq: Once | INTRAMUSCULAR | Status: AC
  Administered 2014-12-10: 0.4 mg via INTRAVENOUS
  Filled 2014-12-10: qty 1

## 2014-12-10 MED ORDER — VITAL HIGH PROTEIN PO LIQD
1000.0000 mL | ORAL | Status: DC
  Administered 2014-12-11 (×3)
  Administered 2014-12-12: 1000 mL
  Filled 2014-12-10 (×3): qty 1000

## 2014-12-10 MED ORDER — MIDAZOLAM HCL 2 MG/2ML IJ SOLN
2.0000 mg | Freq: Once | INTRAMUSCULAR | Status: AC
  Administered 2014-12-10: 2 mg via INTRAVENOUS

## 2014-12-10 MED ORDER — SODIUM CHLORIDE 0.9 % IV SOLN: INTRAVENOUS | Status: DC

## 2014-12-10 MED ORDER — FENTANYL CITRATE (PF) 100 MCG/2ML IJ SOLN
100.0000 ug | Freq: Once | INTRAMUSCULAR | Status: AC
  Administered 2014-12-10: 100 ug via INTRAVENOUS

## 2014-12-10 MED ORDER — VANCOMYCIN HCL 10 G IV SOLR
1500.0000 mg | Freq: Once | INTRAVENOUS | Status: AC
  Administered 2014-12-10: 1500 mg via INTRAVENOUS
  Filled 2014-12-10: qty 1500

## 2014-12-10 MED ORDER — CETYLPYRIDINIUM CHLORIDE 0.05 % MT LIQD
7.0000 mL | Freq: Two times a day (BID) | OROMUCOSAL | Status: DC
  Administered 2014-12-10 – 2014-12-11 (×3): 7 mL via OROMUCOSAL

## 2014-12-10 MED ORDER — FENTANYL CITRATE (PF) 100 MCG/2ML IJ SOLN
INTRAMUSCULAR | Status: AC
  Administered 2014-12-10: 100 ug via INTRAVENOUS
  Filled 2014-12-10: qty 2

## 2014-12-10 MED ORDER — CHLORHEXIDINE GLUCONATE 0.12% ORAL RINSE (MEDLINE KIT)
15.0000 mL | Freq: Two times a day (BID) | OROMUCOSAL | Status: DC
  Administered 2014-12-11 (×2): 15 mL via OROMUCOSAL

## 2014-12-10 MED ORDER — VANCOMYCIN HCL IN DEXTROSE 750-5 MG/150ML-% IV SOLN
750.0000 mg | Freq: Two times a day (BID) | INTRAVENOUS | Status: AC
  Administered 2014-12-11 – 2014-12-13 (×6): 750 mg via INTRAVENOUS
  Filled 2014-12-10 (×8): qty 150

## 2014-12-10 MED ORDER — FENTANYL CITRATE (PF) 100 MCG/2ML IJ SOLN: 50.0000 ug | INTRAMUSCULAR | Status: DC | PRN

## 2014-12-10 MED ORDER — ANTISEPTIC ORAL RINSE SOLUTION (CORINZ)
7.0000 mL | Freq: Four times a day (QID) | OROMUCOSAL | Status: DC
  Administered 2014-12-11 (×2): 7 mL via OROMUCOSAL

## 2014-12-10 MED ORDER — ACETAMINOPHEN 160 MG/5ML PO SOLN
650.0000 mg | ORAL | Status: DC | PRN
  Administered 2014-12-10 – 2014-12-17 (×7): 650 mg
  Filled 2014-12-10 (×7): qty 20.3

## 2014-12-10 MED ORDER — INSULIN ASPART 100 UNIT/ML ~~LOC~~ SOLN
0.0000 [IU] | SUBCUTANEOUS | Status: DC
  Administered 2014-12-12: 3 [IU] via SUBCUTANEOUS
  Administered 2014-12-13 (×2): 2 [IU] via SUBCUTANEOUS
  Administered 2014-12-13 – 2014-12-14 (×5): 3 [IU] via SUBCUTANEOUS
  Administered 2014-12-14: 2 [IU] via SUBCUTANEOUS
  Administered 2014-12-15: 3 [IU] via SUBCUTANEOUS
  Administered 2014-12-15: 2 [IU] via SUBCUTANEOUS
  Administered 2014-12-15: 3 [IU] via SUBCUTANEOUS
  Administered 2014-12-15: 2 [IU] via SUBCUTANEOUS
  Administered 2014-12-16: 3 [IU] via SUBCUTANEOUS
  Administered 2014-12-16: 4 [IU] via SUBCUTANEOUS
  Administered 2014-12-16: 3 [IU] via SUBCUTANEOUS

## 2014-12-10 MED ORDER — MIDAZOLAM HCL 2 MG/2ML IJ SOLN
INTRAMUSCULAR | Status: AC
  Administered 2014-12-10: 2 mg via INTRAVENOUS
  Filled 2014-12-10: qty 2

## 2014-12-10 NOTE — Progress Notes (Signed)
Paged that pt had desated on nasal cannula and was switched to NRB. Evalauted pt at bedside. RN present. Pt denies increased dyspnea.  Physical Exam: Today's Vitals   12/10/14 1750 12/10/14 2000 12/10/14 2004 12/10/14 2113  BP:  160/52 160/52 168/139  Pulse:  108 108 127  Temp:   101.4 F (38.6 C)   TempSrc:   Axillary   Resp:  20 21 36  Height:      Weight:      SpO2:   95% 100%  PainSc: Asleep      General Apperance: respiratory distress Lungs: Course breath sounds bilaterally. Using accessory muscles to breath on NRB Heart: Tachycardic rate and regular rhythm Neurologic: Alert and oriented x 2.  ABG    Component Value Date/Time   PHART 7.393 12/10/2014 2025   PCO2ART 33.4* 12/10/2014 2025   PO2ART 68.4* 12/10/2014 2025   HCO3 19.5* 12/10/2014 2025   TCO2 20.5 12/10/2014 2025   ACIDBASEDEF 4.2* 12/10/2014 2025   O2SAT 92.3 12/10/2014 2025    Assessment/Plan:  Acute hypoxic respiratory failure in setting of CAP: Her ABG is largely unchanged from previous but she is failing to maintain her oxygen saturations on nonrebreather with increased work of breathing using accessory muscles. Obtain CXR. Will place her on BiPAP and consult PCCM.  Jacques Earthly, PGY2 Internal Medicine Teaching Service

## 2014-12-10 NOTE — Progress Notes (Signed)
PT Cancellation Note  Patient Details Name: [name] MRN: [id number] DOB: Feb 11, 1941   Cancelled Treatment:    Reason Eval/Treat Not Completed: Medical issues which prohibited therapy, pt had decline in respiratory and mental status early this morning. RN asks that PT be held currently, will check back later as time allows to see if status has improved.   [place], [place] 12/10/2014, 10:11 AM

## 2014-12-10 NOTE — Procedures (Signed)
Intubation Procedure Note Rosland Riding [id number] June 05, 1940  Procedure: Intubation Indications: Respiratory insufficiency  Procedure Details Consent: Risks of procedure as well as the alternatives and risks of each were explained to the (patient/caregiver).  Consent for procedure obtained. Time Out: Verified patient identification, verified procedure, site/side was marked, verified correct patient position, special equipment/implants available, medications/allergies/relevent history reviewed, required imaging and test results available.  Performed  Maximum sterile technique was used including antiseptics, cap, gloves, hand hygiene and mask.  MAC and 3  Glide 3  7.5 ett  Class 2 airway   Evaluation Hemodynamic Status: BP stable throughout; O2 sats: stable throughout Patient's Current Condition: stable Complications: No apparent complications Patient did tolerate procedure well. Chest X-ray ordered to verify placement.  CXR: pending.   BABCOCK,PETE 12/10/2014   I was present for and participated in the entire procedure.   Baltazar Apo, MD, PhD 12/10/2014, 10:57 PM [facility] [phone] or if no answer [phone]

## 2014-12-10 NOTE — Progress Notes (Signed)
Utilization review completed.  

## 2014-12-10 NOTE — Progress Notes (Signed)
ANTIBIOTIC CONSULT NOTE - INITIAL  Pharmacy Consult for Vancomycin Indication: pneumonia  Allergies  Allergen Reactions  . Codeine Phosphate Other (See Comments)    REACTION: unspecified  . Glimepiride Other (See Comments)    REACTION: unspecified  . Glyburide Other (See Comments)    REACTION: unspecified  . Shellfish Allergy Swelling  . Tramadol Other (See Comments)    unknown   Patient Measurements: Height: 5\' 3"  (160 cm) Weight: 166 lb 10.7 oz (75.6 kg) IBW/kg (Calculated) : 52.4  Vital Signs: Temp: 101.2 F (38.4 C) (08/20 0735) Temp Source: Rectal (08/20 0735) BP: 146/52 mmHg (08/20 1000) Pulse Rate: 98 (08/20 1000) Intake/Output from previous day: 08/19 0701 - 08/20 0700 In: 558.8 [P.O.:240; I.V.:318.8] Out: -   Labs:  Recent Labs  12/09/14 1426 12/10/14 0226  WBC 19.3* 20.1*  HGB 9.0* 9.2*  PLT 391 375  CREATININE 1.12* 0.98   Estimated Creatinine Clearance: 49.1 mL/min (by C-G formula based on Cr of 0.98).  Microbiology: Recent Results (from the past 720 hour(s))  Urine culture     Status: None (Preliminary result)   Collection Time: 12/09/14  5:20 PM  Result Value Ref Range Status   Specimen Description URINE, CATHETERIZED  Final   Special Requests NONE  Final   Culture NO GROWTH < 24 HOURS  Final   Report Status PENDING  Incomplete  Culture, blood (routine x 2) Call MD if unable to obtain prior to antibiotics being given     Status: None (Preliminary result)   Collection Time: 12/09/14  8:18 PM  Result Value Ref Range Status   Specimen Description BLOOD RIGHT ARM  Final   Special Requests BOTTLES DRAWN AEROBIC AND ANAEROBIC 5CC  Final   Culture PENDING  Incomplete   Report Status PENDING  Incomplete  Culture, blood (routine x 2) Call MD if unable to obtain prior to antibiotics being given     Status: None (Preliminary result)   Collection Time: 12/09/14  8:23 PM  Result Value Ref Range Status   Specimen Description BLOOD LEFT ARM  Final   Special Requests BOTTLES DRAWN AEROBIC AND ANAEROBIC 10CC  Final   Culture PENDING  Incomplete   Report Status PENDING  Incomplete  MRSA PCR Screening     Status: None   Collection Time: 12/09/14 10:05 PM  Result Value Ref Range Status   MRSA by PCR NEGATIVE NEGATIVE Final    Comment:        The GeneXpert MRSA Assay (FDA approved for NASAL specimens only), is one component of a comprehensive MRSA colonization surveillance program. It is not intended to diagnose MRSA infection nor to guide or monitor treatment for MRSA infections.    Medical History: Past Medical History  Diagnosis Date  . Anemia, iron deficiency   . Anxiety   . CAD (coronary artery disease)   . Type II or unspecified type diabetes mellitus without mention of complication, not stated as uncontrolled   . GERD (gastroesophageal reflux disease)   . HTN (hypertension)   . Peripheral neuropathy   . Schizophrenia   . Hyperlipidemia   . Allergic rhinitis   . 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 Dementia   . IBS (irritable bowel syndrome)   . Syncope     EP study 1996 w/atrial tachycardia  . Esophageal motility disorder   . Renal insufficiency    Medications:  Anti-infectives    Start     Dose/Rate Route Frequency Ordered  Stop   12/10/14 1700  azithromycin (ZITHROMAX) 500 mg in dextrose 5 % 250 mL IVPB     500 mg 250 mL/hr over 60 Minutes Intravenous Every 24 hours 12/09/14 2017 12/17/14 1659   12/10/14 1700  cefTRIAXone (ROCEPHIN) 1 g in dextrose 5 % 50 mL IVPB     1 g 100 mL/hr over 30 Minutes Intravenous Every 24 hours 12/09/14 2020 12/17/14 1659   12/09/14 2030  cefTRIAXone (ROCEPHIN) 1 g in dextrose 5 % 50 mL IVPB  Status:  Discontinued     1 g 100 mL/hr over 30 Minutes Intravenous Every 24 hours 12/09/14 2017 12/09/14 2020   12/09/14 1600  cefTRIAXone (ROCEPHIN) 1 g in dextrose 5 % 50 mL IVPB     1 g 100 mL/hr over 30 Minutes Intravenous  Once 12/09/14 1556 12/09/14  1729   12/09/14 1600  azithromycin (ZITHROMAX) 500 mg in dextrose 5 % 250 mL IVPB     500 mg 250 mL/hr over 60 Minutes Intravenous  Once 12/09/14 1556 12/09/14 1800     Assessment: 74yo female admitted with weakness, cough and general malaise.  A CXR revals likely pneumonia and she was started on IV Ceftriaxone and Azithromycin.  She has tolerated these well and seems to have responded some with a declining lactic acid 3.56>>1.9.  Her WBC continues to trend higher slightly though from 19.3> 20.1 and we were asked to add Vancomycin to her regimen.  Her renal function is normal with a creatinine of 0.98 and an estimated crcl of 75ml/min.  Goal of Therapy:  Vancomycin trough level 15-20 mcg/ml  Plan:  - Vancomycin 1500 mg IV x 1 then begin 750mg  IV every 12 hr - Monitor renal function - F/U microbiology data - F/U clinical response and LOT  Rober Minion, PharmD., MS Clinical Pharmacist Pager:  [phone] Thank you for allowing pharmacy to be part of this patients care team. 12/10/2014,2:13 PM

## 2014-12-10 NOTE — Clinical Documentation Improvement (Signed)
Internal Medicine  Can the diagnosis of systemic infection be further specified?   Sepsis - specify causative organism if known  Determine if there is Severe Sepsis (Sepsis with organ dysfunction - specify), Septic Shock if present  Specify organ dysfunction - Respiratory Failure, Encephalopathy, Acute Kidney Failure, Pneumonia, UTI, Other (specify), Unable to Clinically Determine}  Other  Clinically Undetermined  Document any associated diagnoses/conditions.  Supporting Information(As per notes) Vitals Upon Admission: Blood pressure 166/89, pulse 122, temperature 102 F (38.9 C), temperature source Rectal, resp. rate 16, height 5\' 3"  (1.6 m), weight 82.555 kg (182 lb), SpO2 94 %.   Labs: Component     Latest Ref Rng 12/09/2014 12/10/2014  WBC     4.0 - 10.5 K/uL 19.3 (H) 20.1 (H)   Community acquired pneumonia: Fever, dyspnea, productive cough, hemodynamically stable with left upper lobe consolidation on chest x-ray, lactate went from 1 to 2.5 to 3.5 within 5 hours.   Please exercise your independent, professional judgment when responding. A specific answer is not anticipated or expected.  Thank You, Alessandra Grout, RN, BSN, CCDS,Clinical Documentation Specialist:  [phone]  [phone]=[facility] [place]- Health Information Management

## 2014-12-10 NOTE — Progress Notes (Signed)
Placed patient on Bipap per MD following nasotracheal suctioning with no increase in sats. Patient is now at 100% and tolerating. Belly breathing and accessory muscle use noted. RT will continue to monitor.

## 2014-12-10 NOTE — Consult Note (Signed)
PULMONARY / CRITICAL CARE MEDICINE   Name: [name] MRN: [id number] DOB: 02/24/1941    ADMISSION DATE:  12/09/2014 CONSULTATION DATE:  8/20  REFERRING MD :  Evette Doffing   CHIEF COMPLAINT:  Hypoxic respiratory failure   INITIAL PRESENTATION:  74 year old female w sig h/o HTN.DM, schizophrenia. Admitted to IMTS on 8/19 w/ working dx of CAP. Developed progressive airspace disease on CXR and worsening hypoxia on 8/20. PCCM called and pt transferred to the ICU for emergent intubation.   STUDIES:  Head CT scan 8/20 >> no acute intracranial abnormality  SIGNIFICANT EVENTS:  HISTORY OF PRESENT ILLNESS:   This is a 74 year-old lady with history of CAD (specifics unknown), hypertension, type II diabetes, hyperlipidemia, and schizophrenia who presented 8/19 with weakness, cough, fever, and diarrhea for ~ 1 week. She reported that she hadn't eaten much over the last two days. She lives in an apartment by herself and has had difficulty getting around because her legs have felt weak but she says she hasn't fallen. She denied chest pain, dysuria, confusion, or pain elsewhere. Dx eval demonstrated bilateral airspace disease and she was admitted w/ working dx of CAP in the SDU. She was placed on appropriate abx and in spite of these measures her O2 requirements had continued to worsen over the course of the day. By 2100 on 8/20 she was severely hypoxic and had a PO2 of 68 on 100% NRB. She was placed on BIPAP and repeat CXR was obtained. This showed progressive airspace disease and PCCM was asked to eval. On arrival she was found to be in acute distress w/ marked accessory muscle use she was moved to the ICU for emergent intubation.   PAST MEDICAL HISTORY :   has a past medical history of Anemia, iron deficiency; Anxiety; CAD (coronary artery disease); Type II or unspecified type diabetes mellitus without mention of complication, not stated as uncontrolled; GERD (gastroesophageal reflux disease); HTN  (hypertension); Peripheral neuropathy; Schizophrenia; Hyperlipidemia; Allergic rhinitis; PVD (peripheral vascular disease); Dementia; IBS (irritable bowel syndrome); Syncope; Esophageal motility disorder; and Renal insufficiency.  has past surgical history that includes Abdominal hysterectomy; Mitral valve replacement (1995); Knee Arthroplasty; and Breast biopsy. Prior to Admission medications   Medication Sig Start Date End Date Taking? Authorizing Provider  acetaminophen (TYLENOL) 500 MG tablet Take 500-1,000 mg by mouth 3 (three) times daily as needed. For back pain    Yes Historical Provider, MD  amitriptyline (ELAVIL) 25 MG tablet Take 1 tablet (25 mg total) by mouth at bedtime. 11/06/10  Yes Neena Rhymes, MD  amLODipine (NORVASC) 10 MG tablet Take 1 tablet (10 mg total) by mouth daily. 10/09/12  Yes Neena Rhymes, MD  aspirin 325 MG tablet Take 325 mg by mouth daily.     Yes Historical Provider, MD  benztropine (COGENTIN) 2 MG tablet Take 2 mg by mouth every morning.   Yes Historical Provider, MD  clonazePAM (KLONOPIN) 0.5 MG tablet Take 0.25 mg by mouth at bedtime.   Yes Historical Provider, MD  cloZAPine (CLOZARIL) 100 MG tablet Take 200 mg by mouth at bedtime.    Yes Historical Provider, MD  donepezil (ARICEPT) 10 MG tablet Take 10 mg by mouth at bedtime.   Yes Historical Provider, MD  ferrous sulfate 325 (65 FE) MG tablet Take 325 mg by mouth daily with breakfast.   Yes Historical Provider, MD  fluticasone (FLONASE) 50 MCG/ACT nasal spray Place 2 sprays into both nostrils daily as needed for allergies.  02/22/13  Yes Historical Provider, MD  furosemide (LASIX) 40 MG tablet Take 0.5 tablets (20 mg total) by mouth daily. For systolic blood pressure management & fluid accumulation. 05/04/12  Yes Neena Rhymes, MD  haloperidol (HALDOL) 10 MG tablet Take 10 mg by mouth 2 (two) times daily.    Yes Historical Provider, MD  insulin aspart protamine- aspart (NOVOLOG MIX 70/30) (70-30) 100  UNIT/ML injection Inject 25 Units into the skin 2 (two) times daily.   Yes Historical Provider, MD  insulin glargine (LANTUS SOLOSTAR) 100 UNIT/ML injection Inject 15 Units into the skin at bedtime. 03/21/11  Yes Neena Rhymes, MD  levocetirizine (XYZAL) 5 MG tablet Take 5 mg by mouth every evening.  03/05/13  Yes Historical Provider, MD  memantine (NAMENDA) 10 MG tablet Take 1 tablet (10 mg total) by mouth 2 (two) times daily. 10/09/12  Yes Neena Rhymes, MD  Multiple Vitamins-Minerals (MULTIVITAMIN,TX-MINERALS) tablet Take 1 tablet by mouth daily.     Yes Historical Provider, MD  NEXIUM 40 MG capsule Take 40 mg by mouth daily.  03/05/13  Yes Historical Provider, MD  pioglitazone (ACTOS) 30 MG tablet Take 1 tablet (30 mg total) by mouth daily. 05/21/12  Yes Neena Rhymes, MD  potassium chloride (KLOR-CON) 8 MEQ tablet Take 1 tablet (8 mEq total) by mouth daily. 04/28/12  Yes Neena Rhymes, MD  ramipril (ALTACE) 10 MG capsule Take 1 capsule (10 mg total) by mouth daily. 10/09/12  Yes Neena Rhymes, MD  rosuvastatin (CRESTOR) 20 MG tablet Take 1 tablet (20 mg total) by mouth daily. 10/09/12  Yes Neena Rhymes, MD  gabapentin (NEURONTIN) 600 MG tablet Take 1 tablet (600 mg total) by mouth 3 (three) times daily. 04/28/12 04/28/13  Neena Rhymes, MD   Allergies  Allergen Reactions  . Codeine Phosphate Other (See Comments)    REACTION: unspecified  . Glimepiride Other (See Comments)    REACTION: unspecified  . Glyburide Other (See Comments)    REACTION: unspecified  . Shellfish Allergy Swelling  . Tramadol Other (See Comments)    unknown    FAMILY HISTORY:  has no family status information on file.  SOCIAL HISTORY:  reports that she has quit smoking. She has never used smokeless tobacco. She reports that she does not drink alcohol or use illicit drugs.  REVIEW OF SYSTEMS:  Unable   SUBJECTIVE: very labored   VITAL SIGNS: Temp:  [98.2 F (36.8 C)-101.4 F (38.6 C)] 101.4 F  (38.6 C) (08/20 2004) Pulse Rate:  [80-127] 127 (08/20 2113) Resp:  [19-36] 36 (08/20 2113) BP: (114-179)/(46-139) 168/139 mmHg (08/20 2113) SpO2:  [93 %-100 %] 100 % (08/20 2113) HEMODYNAMICS:   VENTILATOR SETTINGS:   INTAKE / OUTPUT:  Intake/Output Summary (Last 24 hours) at 12/10/14 2130 Last data filed at 12/10/14 2000  Gross per 24 hour  Intake 1951.25 ml  Output    200 ml  Net 1751.25 ml    PHYSICAL EXAMINATION: General:  critically ill appearing AAF, currently in rigors and in acute distress.  Neuro:  Awake, oriented. Sp slurred. Generalized weakness HEENT:  BIPAP in place.  Cardiovascular:  rrr Lungs:  Scattered rhonchi, marked accessory muscle use  Abdomen:  Soft, + bowel sounds, no OM  Musculoskeletal:  Intact  Skin:  Bilateral LE edema   LABS:  CBC  Recent Labs Lab 12/09/14 1426 12/10/14 0226  WBC 19.3* 20.1*  HGB 9.0* 9.2*  HCT 28.1* 29.1*  PLT 391 375   Coag's No  results for input(s): APTT, INR in the last 168 hours. BMET  Recent Labs Lab 12/09/14 1426 12/10/14 0226  NA 140 142  K 4.3 4.1  CL 107 115*  CO2 22 20*  BUN 20 15  CREATININE 1.12* 0.98  GLUCOSE 93 116*   Electrolytes  Recent Labs Lab 12/09/14 1426 12/10/14 0226  CALCIUM 9.2 8.3*   Sepsis Markers  Recent Labs Lab 12/09/14 1704 12/09/14 1757 12/09/14 2054  LATICACIDVEN 2.46* 3.56* 1.9   ABG  Recent Labs Lab 12/10/14 0502 12/10/14 2025  PHART 7.391 7.393  PCO2ART 33.5* 33.4*  PO2ART 57.4* 68.4*   Liver Enzymes  Recent Labs Lab 12/09/14 1426 12/10/14 0226  AST 29 29  ALT 24 22  ALKPHOS 109 107  BILITOT 0.3 0.3  ALBUMIN 2.1* 1.9*   Cardiac Enzymes No results for input(s): TROPONINI, PROBNP in the last 168 hours. Glucose  Recent Labs Lab 12/09/14 2201 12/10/14 0455 12/10/14 0800 12/10/14 1346 12/10/14 1416 12/10/14 1618  GLUCAP 122* 95 83 61* 94 133*    Imaging Ct Head Wo Contrast  12/10/2014   CLINICAL DATA:  Acute encephalopathy.   EXAM: CT HEAD WITHOUT CONTRAST  TECHNIQUE: Contiguous axial images were obtained from the base of the skull through the vertex without intravenous contrast.  COMPARISON:  12/25/2010  FINDINGS: There is no evidence of intracranial hemorrhage, brain edema, or other signs of acute infarction. There is no evidence of intracranial mass lesion or mass effect. No abnormal extraaxial fluid collections are identified.  Mild cerebral atrophy is stable. Ventricles are stable in size. No skull abnormality identified.  IMPRESSION: No acute intracranial abnormality.  Stable mild cerebral atrophy.   Electronically Signed   By: Earle Gell M.D.   On: 12/10/2014 07:08   Dg Chest Port 1 View  12/10/2014   CLINICAL DATA:  Acute respiratory failure  EXAM: PORTABLE CHEST - 1 VIEW  COMPARISON:  Chest x-ray from yesterday  FINDINGS: Progressive bilateral lung opacity, most dense in the left upper lobe medially. No airspace disease or nodules seen in this location on esophagram 08/26/2013. Mild cardiomegaly. Mediastinal contours are distorted by rightward rotation.  IMPRESSION: Worsening inflation and increasing bilateral airspace disease consistent with pneumonia.   Electronically Signed   By: Monte Fantasia M.D.   On: 12/10/2014 06:03     ASSESSMENT / PLAN:  PULMONARY OETT  A: Severe CAP Acute hypoxic respiratory failure in setting of CAP Evolving ARDS P:   Move to ICU Intubate ARDS protocol BMP PAD protocol w continuous sedation and prn fentanyl Consider CVL, she will likely require  CARDIOVASCULAR CVL A:  Severe sepsis  P:  Transfer to ICU Cont IVFs Consider CVL  RENAL A:   Hyperchloremia  At risk for AKI  P:   Change MIVF to LR Avoid hypotension  Trend chemistry and UOP   GASTROINTESTINAL A:   No acute  P:   Place OGT Start Tubefeeds  PPI for SUP   HEMATOLOGIC A:   AOCD P:  Trend CBC LMWH Transfuse per ICU protocol   INFECTIOUS A:   CAP (NOS) Sepsis  Ustrep >> neg  P:    BCx2 8/19>>> UC 8/19>>> u legionella 8/19>>> Sputum 8/19>>> azithro 8/19>>> Rocephin 8/19>>> vanc 8/20>>>  ENDOCRINE A:   DM P:   Trend glucose  ssi   NEUROLOGIC A:   Acute encephalopathy P:   RASS goal: -2 PAD protocol, continuous sedation and prn fentanyl Supportive care    FAMILY  - Updates: updated daughter by  phome 8/20  - Inter-disciplinary family meet or Palliative Care meeting due by:  8/27    TODAY'S SUMMARY:  CAP and now evolving ARDS. Needs intubation, ARDS protocol, agree w/ current abx. CVP may be helpful to assist w/ volume status. Will check stat lactic acid cont IVFs and transfer to ICU  Erick Colace ACNP-BC [place] Pager # [phone] OR # [phone] if no answer 12/10/2014, 9:30 PM  Attending Note:  I have examined patient, reviewed labs, studies and notes. I have discussed the case with Jerrye Bushy, and I agree with the data and plans as amended above. Pt has developed progressive dyspnea, acute hypoxemic respiratory failure in te setting of severe multilobar CAP. On my evaluation she is in moderate resp distress on temporizing BiPAP, having rigors. She is tachycardic and slightly hypertensive. She will clearly require intubation and MV. We will plan to initiate ARDS protocol, sedation. Follow ABG, and BMP. Independent critical care time is 60 minutes.   Baltazar Apo, MD, PhD 12/10/2014, 10:21 PM [facility] [phone] or if no answer [phone]

## 2014-12-10 NOTE — Progress Notes (Signed)
Hypoglycemic Event  CBG: 61  Treatment: lunch  Symptoms: lethargy,drowsy  Follow-up CBG: Time:1446 CBG Result:94  Possible Reasons for Event:poor po intake      Ariani Seier, Jolene Schimke  Remember to initiate Hypoglycemia Order Set & complete

## 2014-12-10 NOTE — H&P (Signed)
Internal Medicine Attending Admission Note  I saw and evaluated the patient. I reviewed the resident's note and I agree with the resident's findings and plan as documented in the resident's note.  Assessment & Plan by Problem:  Principal Problem:   CAP (community acquired pneumonia) Active Problems:   Diabetes mellitus, type 2   Dementia   Schizophrenia, unspecified type   Essential hypertension  Acute hypoxic respiratory failure due to community acquired pneumonia:  Clinical course, today's exam, and review of laboratory shows evidence consistent with severe sepsis. Chest x-ray with a large infiltrate in the left upper lobe. No recent hospitalizations to suggest hospital-acquired pneumonia. Currently requiring 6 L by nasal cannula to maintain adequate oxygenation. No clear history of underlying lung disease.  - Antibiotic coverage with ceftriaxone and azithromycin - Supportive oxygen to maintain saturations above 92% - Blood culture, sputum culture, urine Legionella pending - Poor candidate for noninvasive positive pressure ventilation due to poor mental status - We will try to better assess patient's goals of care by talking to her daughter who will serve as a surrogate decision maker.  Acute Encephalopathy: The patient experienced an acute worsening of her mental status overnight. Currently only arousable to stimulation. CT head was normal with no spontaneous bleed. Likely related to sepsis, pneumonia, hypoxia, and potentially psychoactive medication withdrawal. She has an underlying diagnosis of dementia and schizophrenia, baseline cognitive status is unknown at this time. - Treating sepsis and hypoxia as above - Holding psychoactive medications until mental status improves, home benzodiazepine is a very low dose so I doubt she will have withdrawal   Chief Complaint(s):  Weakness  History - key components related to admission:  74 year old woman was brought in from home with several  days of progressive weakness, cough, subjective fever. My exam this morning, unable to elicit any history of present illness because of altered mental status.  Lab results: Reviewed in Epic  Physical Exam - key components related to admission:  Filed Vitals:   12/10/14 0613 12/10/14 0735 12/10/14 0800 12/10/14 1000  BP: 179/61 169/58 165/59 146/52  Pulse: 107 106 110 98  Temp:  101.2 F (38.4 C)    TempSrc:  Rectal    Resp: 21 20 25 22   Height:      Weight:      SpO2:  94%     Gen: Acutely ill-appearing woman, lying in bed, asleep and arousable briefly to stimulation, non-conversational. ENT: Normal mucous membranes, neck is large with normal thyroid CV: Distant heart sounds, tachycardic, no murmurs Respirations: Tachypnea, moderate coarse crackles heard on the left in the right bases and anterior lung fields. Abdomen: Obese, soft, non-distended and nontender Extremities: Warm and well-perfused Neuro: Arousable only to moderate stimulation, not to voice, gag reflex is intact, patient does follow simple commands like "open you eyes".

## 2014-12-10 NOTE — Progress Notes (Signed)
Patient ID: [name], female   DOB: May 31, 1940, 74 y.o.   MRN: [id number]   Subjective: [name] opened her eyes when we spoke loudly this morning but was very lethargic and did not speak.  Last night, Dr. Juleen [name] and Dr. Ronnald [name] were paged by nursing that she was unresponsive. The arterial blood gas was notable for hypoxemia without hypercarbia and chest x-ray showed questionable interval worsening of right perihilar consolidation but no changes consistent with aspiration. She underwent a head CT that was negative and her oxygen via nasal cannula was titrated to 4L with sats in the upper 90s.  Objective: Vital signs in last 24 hours: Filed Vitals:   12/10/14 0500 12/10/14 0600 12/10/14 0613 12/10/14 0735  BP: 177/57 175/60 179/61 169/58  Pulse: 113 111 107 106  Temp:    101.2 F (38.4 C)  TempSrc:    Rectal  Resp: 22 22 21 20   Height:      Weight:      SpO2:    94%   Weight change:   Intake/Output Summary (Last 24 hours) at 12/10/14 1032 Last data filed at 12/10/14 0100  Gross per 24 hour  Intake 558.75 ml  Output      0 ml  Net 558.75 ml   General: Lying in bed snoring. Arousable to loud noise and touch and protecting her airway by coughing. Cardiac: Tachyycardic, no rubs, murmurs or gallops Pulm: Left upper lobe inspiratory crackles. No bibasilar crackles. She had a positive gag reflex. Abd: soft, nontender, nondistended, BS present Ext:  Quite warm , no pedal edema  Lab Results: Basic Metabolic Panel:  Recent Labs Lab 12/09/14 1426 12/10/14 0226  NA 140 142  K 4.3 4.1  CL 107 115*  CO2 22 20*  GLUCOSE 93 116*  BUN 20 15  CREATININE 1.12* 0.98  CALCIUM 9.2 8.3*   CBC:  Recent Labs Lab 12/09/14 1426 12/10/14 0226  WBC 19.3* 20.1*  NEUTROABS 16.0* 16.0*  HGB 9.0* 9.2*  HCT 28.1* 29.1*  MCV 96.6 98.3  PLT 391 375   Micro Results: Recent Results (from the past 240 hour(s))  Culture, blood (routine x 2) Call MD if unable to obtain prior to  antibiotics being given     Status: None (Preliminary result)   Collection Time: 12/09/14  8:18 PM  Result Value Ref Range Status   Specimen Description BLOOD RIGHT ARM  Final   Special Requests BOTTLES DRAWN AEROBIC AND ANAEROBIC 5CC  Final   Culture PENDING  Incomplete   Report Status PENDING  Incomplete  Culture, blood (routine x 2) Call MD if unable to obtain prior to antibiotics being given     Status: None (Preliminary result)   Collection Time: 12/09/14  8:23 PM  Result Value Ref Range Status   Specimen Description BLOOD LEFT ARM  Final   Special Requests BOTTLES DRAWN AEROBIC AND ANAEROBIC 10CC  Final   Culture PENDING  Incomplete   Report Status PENDING  Incomplete  MRSA PCR Screening     Status: None   Collection Time: 12/09/14 10:05 PM  Result Value Ref Range Status   MRSA by PCR NEGATIVE NEGATIVE Final    Comment:        The GeneXpert MRSA Assay (FDA approved for NASAL specimens only), is one component of a comprehensive MRSA colonization surveillance program. It is not intended to diagnose MRSA infection nor to guide or monitor treatment for MRSA infections.    Studies/Results: Ct Head Wo Contrast  12/10/2014  CLINICAL DATA:  Acute encephalopathy.  EXAM: CT HEAD WITHOUT CONTRAST  TECHNIQUE: Contiguous axial images were obtained from the base of the skull through the vertex without intravenous contrast.  COMPARISON:  12/25/2010  FINDINGS: There is no evidence of intracranial hemorrhage, brain edema, or other signs of acute infarction. There is no evidence of intracranial mass lesion or mass effect. No abnormal extraaxial fluid collections are identified.  Mild cerebral atrophy is stable. Ventricles are stable in size. No skull abnormality identified.  IMPRESSION: No acute intracranial abnormality.  Stable mild cerebral atrophy.   Electronically Signed   By: Earle Gell M.D.   On: 12/10/2014 07:08   Dg Chest Port 1 View  12/10/2014   CLINICAL DATA:  Acute respiratory  failure  EXAM: PORTABLE CHEST - 1 VIEW  COMPARISON:  Chest x-ray from yesterday  FINDINGS: Progressive bilateral lung opacity, most dense in the left upper lobe medially. No airspace disease or nodules seen in this location on esophagram 08/26/2013. Mild cardiomegaly. Mediastinal contours are distorted by rightward rotation.  IMPRESSION: Worsening inflation and increasing bilateral airspace disease consistent with pneumonia.   Electronically Signed   By: Monte Fantasia M.D.   On: 12/10/2014 06:03   Medications: I have reviewed the patient's current medications. Scheduled Meds: . aspirin  325 mg Oral Daily  . azithromycin  500 mg Intravenous Q24H  . cefTRIAXone (ROCEPHIN)  IV  1 g Intravenous Q24H  . enoxaparin (LOVENOX) injection  40 mg Subcutaneous Q24H  . ferrous sulfate  325 mg Oral Q breakfast  . gabapentin  600 mg Oral TID  . insulin aspart  0-5 Units Subcutaneous QHS  . insulin aspart  0-9 Units Subcutaneous TID WC  . insulin glargine  5 Units Subcutaneous QHS  . sodium chloride  3 mL Intravenous Q12H   Continuous Infusions:  PRN Meds:.acetaminophen, fluticasone   Assessment/Plan:  Community acquired pneumonia: She is now quite altered compared to when we saw her last night. She remains febrile and arterial blood gas showed hypoxemia without hypercarbia, but otherwise she is hemodynamically stable and is protecting her airway well. I question whether her altered mental status is from her psychotropic medications or hypoxemia from her pneumonia. She's not hypercarbic which is what I would typically expect to cause altered mentation. Regardless, I held her psychotropic medications and we will continue antibiotics and follow blood and sputum cultures. We may need to call PCCM but for now she is protecting her airway and satting 97% on 4L. Her MRSA nare screen was negative which is slightly reassuring as this has a negative predictive value of 99.2% for MRSA pneumonia. I spoke to her daughter,  [name] and updated her; she is coming to the hospital now. -Continue ceftriaxone and azithromycin for CAP -Acetaminophen for fever -Follow-up blood and sputum cultures -Holding psychotropics -CBC tomorrow to trend leukocytosis  Hypertension:170s/90s overnight -Holding furosemide while we give IV boluses -Holding amlodipine and ramipril   Type II diabetes mellitus: Last a1c 7.7 in 2012. Sugars 80s while inpatient. -5 U Lantus QHS -SSI  Schizophrenia: Her psychotropics may be contributing to her altered mental status, per above. -I stopped her haldol, clozapine, amitriptyline, benztropine, donepezil, memantine  Dispo: Disposition is deferred at this time, awaiting improvement of current medical problems.   The patient does not have a current PCP (Provider Not In System) and does need an [facility] follow-up appointment after discharge.  The patient does not know have transportation limitations that hinder transportation to clinic appointments.  .Services Needed  at time of discharge: Y = Yes, Blank = No PT:   OT:   RN:   Equipment:   Other:     LOS: 1 day   Loleta Chance, MD 12/10/2014, 10:32 AM

## 2014-12-10 NOTE — Progress Notes (Signed)
Patient ID: [name], female   DOB: 1940-10-21, 74 y.o.   MRN: [id number]  This afternoon I spoke with [name]'s three daughters this afternoon and explained our uncertainty of how her pneumonia will progress. They were very understanding and asked thoughtful, relevant questions. They are astute, loving caretakers.  When I brought up the possibility that she may require transfer to the ICU, her daughter, [name], said she had talked about end-of-life with her mother and her mother said she would want everything done to keep her alive, including chest compressions, shocks, intubation, and antibiotics. She said she is staying optimistic and praying but understands that we simply cannot predict the future.  Upon discussion with the medicine team, we decided to add vancomycin to her antibiotic regiment given her progressive septic appearance throughout the day.  Jane Chance, MD

## 2014-12-10 NOTE — Progress Notes (Addendum)
Called by nurse for change in mental status. RN states patient became more lethargic, somnolent overnight. Febrile to 101.3. Patient seen at bedside, breathing appears somewhat labored, SpO2 decreased to 91% on 2L O2 via [place]. Pulmonary exam unchanged, scattered rhonchi. Received 0.25 mg Klonopin earlier in the evening, as well as Clozapine and Neurontin. Had lactic acidosis earlier in the evening, resolved w/ IVF. Received 2.5L bolus in ED, has been continued on NS @ 125 cc/hr. CBG 91. BP stable, tachycardic on exam. Responds to painful stimuli. Possible aspiration vs sedating medications. -Repeat CXR -ABG STAT -HOLD IVF for now -Will discuss w/ PCCM  Natasha Bence, MD PGY-3, Internal Medicine Pager: [phone]  ADDENDUM: ABG shows 7.39/33.5/57.4/19.9, significant for hypoxic respiratory failure. CXR poor inspiratory film, although does not appear significantly changed from previous. Still w/ LUL and right perihilar infiltrate. Discussed w/ Dr. Ashok Cordia, PCCM to evaluate.   Natasha Bence, MD PGY-3, Internal Medicine Pager: [phone]

## 2014-12-10 NOTE — Progress Notes (Signed)
Pt temp 101.3 rectally at 4:30 a.m. RN came to give her tylenol but pt is not following commands. Unable keep her eyes open. Paged Internal medicine doctor on call.   Dr. Dayna Ramus at bedside consulted PCCM Will cont. Monitor pt.

## 2014-12-10 NOTE — Progress Notes (Signed)
eLink Physician-Brief Progress Note Patient Name: [name] DOB: 18-Aug-1940 MRN: [id number]   Date of Service  12/10/2014  HPI/Events of Note  Fever to 104.5 F. Tylenol already ordered.   eICU Interventions  Will order cooling blanket.     Intervention Category Major Interventions: Infection - evaluation and management  Keshonna Valvo [place] 12/10/2014, 11:08 PM

## 2014-12-10 NOTE — Progress Notes (Signed)
PT Cancellation Note  Patient Details Name: [name] MRN: [id number] DOB: 1940/07/25   Cancelled Treatment:    Reason Eval/Treat Not Completed: Medical issues which prohibited therapy. Checked back with unit 2C who reported that Ms. Caudell's status has not improved since this morning. Will hold eval until tomorrow.   [place], [place] 12/10/2014, 1:30 PM

## 2014-12-10 NOTE — Progress Notes (Signed)
Called to patient's bedside due to decreased sats. Patient was on 6L nasal cannula with 87% SpO2 and after placing venti mask 55% sats decreased to 86%. Patient is now on NRb with sats of 92%. RT at bedside

## 2014-12-11 DIAGNOSIS — R6520 Severe sepsis without septic shock: Secondary | ICD-10-CM

## 2014-12-11 LAB — GLUCOSE, CAPILLARY
GLUCOSE-CAPILLARY: 104 mg/dL — AB (ref 65–99)
GLUCOSE-CAPILLARY: 120 mg/dL — AB (ref 65–99)
GLUCOSE-CAPILLARY: 95 mg/dL (ref 65–99)
GLUCOSE-CAPILLARY: 128 mg/dL — AB (ref 65–99)
GLUCOSE-CAPILLARY: 188 mg/dL — AB (ref 65–99)
Glucose-Capillary: 164 mg/dL — ABNORMAL HIGH (ref 65–99)

## 2014-12-11 LAB — BLOOD GAS, ARTERIAL
Acid-base deficit: 5.3 mmol/L — ABNORMAL HIGH (ref 0.0–2.0)
Bicarbonate: 19.9 mEq/L — ABNORMAL LOW (ref 20.0–24.0)
DRAWN BY: 24513
FIO2: 60
MECHVT: 300 mL
O2 SAT: 93.7 %
PATIENT TEMPERATURE: 98.6
PCO2 ART: 41.4 mmHg (ref 35.0–45.0)
PEEP: 12 cmH2O
PH ART: 7.304 — AB (ref 7.350–7.450)
PO2 ART: 78.8 mmHg — AB (ref 80.0–100.0)
RATE: 24 resp/min
TCO2: 21.2 mmol/L (ref 0–100)

## 2014-12-11 LAB — CBC
HCT: 25.1 % — ABNORMAL LOW (ref 36.0–46.0)
Hemoglobin: 8 g/dL — ABNORMAL LOW (ref 12.0–15.0)
MCH: 31.9 pg (ref 26.0–34.0)
MCHC: 31.9 g/dL (ref 30.0–36.0)
MCV: 100 fL (ref 78.0–100.0)
PLATELETS: 354 10*3/uL (ref 150–400)
RBC: 2.51 MIL/uL — ABNORMAL LOW (ref 3.87–5.11)
RDW: 16.1 % — AB (ref 11.5–15.5)
WBC: 22.3 10*3/uL — AB (ref 4.0–10.5)

## 2014-12-11 LAB — URINE CULTURE: Culture: NO GROWTH

## 2014-12-11 LAB — BASIC METABOLIC PANEL
ANION GAP: 10 (ref 5–15)
BUN: 16 mg/dL (ref 6–20)
CALCIUM: 8.4 mg/dL — AB (ref 8.9–10.3)
CO2: 19 mmol/L — ABNORMAL LOW (ref 22–32)
Chloride: 113 mmol/L — ABNORMAL HIGH (ref 101–111)
Creatinine, Ser: 1.11 mg/dL — ABNORMAL HIGH (ref 0.44–1.00)
GFR, EST AFRICAN AMERICAN: 55 mL/min — AB (ref >60)
GFR, EST NON AFRICAN AMERICAN: 48 mL/min — AB (ref >60)
GLUCOSE: 118 mg/dL — AB (ref 65–99)
Potassium: 4 mmol/L (ref 3.5–5.1)
SODIUM: 142 mmol/L (ref 135–145)

## 2014-12-11 LAB — BRAIN NATRIURETIC PEPTIDE: B Natriuretic Peptide: 143 pg/mL — ABNORMAL HIGH (ref 0.0–100.0)

## 2014-12-11 LAB — PROCALCITONIN
PROCALCITONIN: 23.23 ng/mL
PROCALCITONIN: 27.36 ng/mL

## 2014-12-11 MED ORDER — FENTANYL BOLUS VIA INFUSION
25.0000 ug | INTRAVENOUS | Status: DC | PRN
  Administered 2014-12-15 – 2014-12-16 (×4): 50 ug via INTRAVENOUS
  Filled 2014-12-11: qty 50

## 2014-12-11 MED ORDER — MIDAZOLAM HCL 2 MG/2ML IJ SOLN
1.0000 mg | INTRAMUSCULAR | Status: DC | PRN
  Administered 2014-12-11 – 2014-12-20 (×10): 2 mg via INTRAVENOUS
  Filled 2014-12-11 (×11): qty 2

## 2014-12-11 MED ORDER — ANTISEPTIC ORAL RINSE SOLUTION (CORINZ): 7.0000 mL | Freq: Four times a day (QID) | OROMUCOSAL | Status: DC

## 2014-12-11 MED ORDER — SODIUM CHLORIDE 0.9 % IV SOLN
INTRAVENOUS | Status: DC
  Administered 2014-12-11: 11:00:00 via INTRAVENOUS
  Administered 2014-12-11 – 2014-12-12 (×2): 1000 mL via INTRAVENOUS
  Administered 2014-12-12 – 2014-12-13 (×2): via INTRAVENOUS

## 2014-12-11 MED ORDER — FENTANYL CITRATE (PF) 2500 MCG/50ML IJ SOLN
25.0000 ug/h | INTRAMUSCULAR | Status: DC
  Administered 2014-12-11: 25 ug/h via INTRAVENOUS
  Administered 2014-12-11: 200 ug/h via INTRAVENOUS
  Administered 2014-12-12: 125 ug/h via INTRAVENOUS
  Administered 2014-12-13: 250 ug/h via INTRAVENOUS
  Administered 2014-12-14: 50 ug/h via INTRAVENOUS
  Administered 2014-12-14: 150 ug/h via INTRAVENOUS
  Administered 2014-12-15: 75 ug/h via INTRAVENOUS
  Administered 2014-12-16: 100 ug/h via INTRAVENOUS
  Administered 2014-12-17: 75 ug/h via INTRAVENOUS
  Filled 2014-12-11 (×8): qty 50

## 2014-12-11 MED ORDER — ANTISEPTIC ORAL RINSE SOLUTION (CORINZ)
7.0000 mL | Freq: Four times a day (QID) | OROMUCOSAL | Status: DC
  Administered 2014-12-11 – 2014-12-27 (×52): 7 mL via OROMUCOSAL

## 2014-12-11 MED ORDER — CHLORHEXIDINE GLUCONATE 0.12% ORAL RINSE (MEDLINE KIT)
15.0000 mL | Freq: Two times a day (BID) | OROMUCOSAL | Status: DC
  Administered 2014-12-11 – 2014-12-27 (×31): 15 mL via OROMUCOSAL

## 2014-12-11 MED ORDER — DEXTROSE 5 % IV SOLN
2.0000 g | INTRAVENOUS | Status: AC
  Administered 2014-12-11 – 2014-12-16 (×6): 2 g via INTRAVENOUS
  Filled 2014-12-11 (×6): qty 2

## 2014-12-11 NOTE — Progress Notes (Signed)
PT Cancellation Note  Patient Details Name: [name] MRN: [id number] DOB: [birth year]   Cancelled Treatment:    Reason Eval/Treat Not Completed: Patient not medically ready   Noted Events of yesterday, including transfer to ICU with emergent intubation;  Will discharge PT services at this time and be happy to reconsult when pt is ready with new PT orders;   Roney Marion, [place]  [facility] Services Pager [phone] Office [phone]    Roney Marion  [facility] 12/11/2014, 8:11 AM

## 2014-12-11 NOTE — Progress Notes (Signed)
PULMONARY / CRITICAL CARE MEDICINE   Name: [name] MRN: [id number] DOB: Jun 17, 1940    ADMISSION DATE:  12/09/2014 CONSULTATION DATE:  8/20  REFERRING MD :  Evette Doffing   CHIEF COMPLAINT:  Hypoxic respiratory failure   INITIAL PRESENTATION:  74 year old female w sig h/o HTN.DM, schizophrenia. Admitted to IMTS on 8/19 w/ working dx of CAP. Developed progressive airspace disease on CXR and worsening hypoxia on 8/20. PCCM called and pt transferred to the ICU for emergent intubation.   STUDIES:  Head CT scan 8/20 >> no acute intracranial abnormality  SIGNIFICANT EVENTS: 8/20 intubated for respiratory failure  SUBJECTIVE: 8/20 intubated for respiratory failure, heavily sedated, desaturated overnight with vent dyssynchrony  VITAL SIGNS: Temp:  [98.1 F (36.7 C)-104.5 F (40.3 C)] 98.2 F (36.8 C) (08/21 0800) Pulse Rate:  [69-135] 69 (08/21 0800) Resp:  [0-36] 24 (08/21 0800) BP: (99-181)/(42-139) 100/47 mmHg (08/21 0800) SpO2:  [93 %-100 %] 100 % (08/21 0800) FiO2 (%):  [50 %-70 %] 50 % (08/21 0800) Weight:  [79.4 kg (175 lb 0.7 oz)] 79.4 kg (175 lb 0.7 oz) (08/21 0500) HEMODYNAMICS:   VENTILATOR SETTINGS: Vent Mode:  [-] PRVC FiO2 (%):  [50 %-70 %] 50 % Set Rate:  [24 bmp] 24 bmp Vt Set:  [300 mL] 300 mL PEEP:  [8 cmH20-12 cmH20] 8 cmH20 Plateau Pressure:  [17 cmH20-20 cmH20] 20 cmH20 INTAKE / OUTPUT:  Intake/Output Summary (Last 24 hours) at 12/11/14 0856 Last data filed at 12/11/14 0800  Gross per 24 hour  Intake 2413.02 ml  Output    800 ml  Net 1613.02 ml    PHYSICAL EXAMINATION: General:  Sedated on vent HENT: NCAT, PERRL, ETT in place PULM: coarse crackles left, vent supported breaths CV: RRR, no mgr GI: BS+, soft nontender MSK: normal bulk and tone Derm: no rash Neuro: sedated on vent, moves limbs spontaneously  LABS:  CBC  Recent Labs Lab 12/09/14 1426 12/10/14 0226 12/11/14 0200  WBC 19.3* 20.1* 22.3*  HGB 9.0* 9.2* 8.0*  HCT 28.1* 29.1*  25.1*  PLT 391 375 354   Coag's No results for input(s): APTT, INR in the last 168 hours. BMET  Recent Labs Lab 12/09/14 1426 12/10/14 0226 12/11/14 0200  NA 140 142 142  K 4.3 4.1 4.0  CL 107 115* 113*  CO2 22 20* 19*  BUN 20 15 16   CREATININE 1.12* 0.98 1.11*  GLUCOSE 93 116* 118*   Electrolytes  Recent Labs Lab 12/09/14 1426 12/10/14 0226 12/11/14 0200  CALCIUM 9.2 8.3* 8.4*   Sepsis Markers  Recent Labs Lab 12/09/14 1704 12/09/14 1757 12/09/14 2054 12/10/14 2311 12/11/14 0200  LATICACIDVEN 2.46* 3.56* 1.9  --   --   PROCALCITON  --   --   --  23.23 27.36   ABG  Recent Labs Lab 12/10/14 2025 12/10/14 2345 12/11/14 0316  PHART 7.393 7.228* 7.304*  PCO2ART 33.4* 49.7* 41.4  PO2ART 68.4* 123* 78.8*   Liver Enzymes  Recent Labs Lab 12/09/14 1426 12/10/14 0226  AST 29 29  ALT 24 22  ALKPHOS 109 107  BILITOT 0.3 0.3  ALBUMIN 2.1* 1.9*   Cardiac Enzymes No results for input(s): TROPONINI, PROBNP in the last 168 hours. Glucose  Recent Labs Lab 12/10/14 1346 12/10/14 1416 12/10/14 1618 12/10/14 2303 12/11/14 0344 12/11/14 0823  GLUCAP 61* 94 133* 113* 104* 120*    Imaging 8/21 CXR personally reviewed> ETT in place, left lung consolidation noted, clear on right   ASSESSMENT /  PLAN:  PULMONARY OETT  A: Severe CAP Acute hypoxic respiratory failure in setting of CAP P:   Full vent support Change from ARDS protocol to routine vent Maintain PEEP 8, FiO2 50% today, goal O2 sat > 90% VAP bundle Repeat CXR in AM Repeat ABG in AM  CARDIOVASCULAR CVL  A:  Severe sepsis without shock, lactic acid improved with IVF P:  Change IVF to Saline Continue Tele Place CVL today  RENAL A:   Mild increase in Renal function, likely related to sepsis P:   Change LR back to saline Monitor BMET and UOP Replace electrolytes as needed  GASTROINTESTINAL A:   No acute issues P:   Place OGT Continue Tubefeeds  PPI for SUP    HEMATOLOGIC A:   Normocytic anemia without bleeding P:  Monitor for bleeding Transfusion threshold for Hgb < 7gm/dL  INFECTIOUS A:   Severe CAP Severe Sepsis  Ustrep >> neg  P:   BCx2 8/19>>> UC 8/19>>> u legionella 8/19>>> Sputum 8/19>>>  azithro 8/19>>> Rocephin 8/19>>> (increase dose to 2gm 8/21) vanc 8/20>>>  ENDOCRINE A:   DM P:   Trend glucose  ssi   NEUROLOGIC A:   Acute encephalopathy Vent dyssynchrony P:   RASS goal: -2 Fentanyl gtt and prn  PRN versed D/c propofol Supportive care    FAMILY  - Updates: updated daughter by phome 8/20  - Inter-disciplinary family meet or Palliative Care meeting due by:  8/27  My CC time is 40 minutes   Roselie Awkward, MD [place] [facility] Pager: [phone] Cell: [phone] After 3pm or if no response, call [phone]

## 2014-12-11 NOTE — Progress Notes (Signed)
Pt escorted to 3 M with Rapid Response Team and 2 C SDU RN. Bedside report provided per [facility] RN. Pt intubated upon arrival. Care assumed per 3 M RN and PCCM team.

## 2014-12-12 ENCOUNTER — Inpatient Hospital Stay (HOSPITAL_COMMUNITY): Payer: Medicare Other

## 2014-12-12 LAB — URINALYSIS, ROUTINE W REFLEX MICROSCOPIC
Bilirubin Urine: NEGATIVE
GLUCOSE, UA: NEGATIVE mg/dL
KETONES UR: NEGATIVE mg/dL
NITRITE: NEGATIVE
PROTEIN: 100 mg/dL — AB
Specific Gravity, Urine: 1.026 (ref 1.005–1.030)
Urobilinogen, UA: 1 mg/dL (ref 0.0–1.0)
pH: 6 (ref 5.0–8.0)

## 2014-12-12 LAB — CBC WITH DIFFERENTIAL/PLATELET
BASOS PCT: 0 % (ref 0–1)
Basophils Absolute: 0 10*3/uL (ref 0.0–0.1)
EOS PCT: 0 % (ref 0–5)
Eosinophils Absolute: 0 10*3/uL (ref 0.0–0.7)
HEMATOCRIT: 24.8 % — AB (ref 36.0–46.0)
Hemoglobin: 7.6 g/dL — ABNORMAL LOW (ref 12.0–15.0)
LYMPHS PCT: 6 % — AB (ref 12–46)
Lymphs Abs: 1.5 10*3/uL (ref 0.7–4.0)
MCH: 30.5 pg (ref 26.0–34.0)
MCHC: 30.6 g/dL (ref 30.0–36.0)
MCV: 99.6 fL (ref 78.0–100.0)
Monocytes Absolute: 1.5 10*3/uL — ABNORMAL HIGH (ref 0.1–1.0)
Monocytes Relative: 6 % (ref 3–12)
NEUTROS ABS: 22.4 10*3/uL — AB (ref 1.7–7.7)
NEUTROS PCT: 88 % — AB (ref 43–77)
Platelets: 383 10*3/uL (ref 150–400)
RBC: 2.49 MIL/uL — ABNORMAL LOW (ref 3.87–5.11)
RDW: 16.2 % — ABNORMAL HIGH (ref 11.5–15.5)
WBC: 25.4 10*3/uL — ABNORMAL HIGH (ref 4.0–10.5)

## 2014-12-12 LAB — BLOOD GAS, ARTERIAL
ACID-BASE DEFICIT: 4.9 mmol/L — AB (ref 0.0–2.0)
Bicarbonate: 19.8 mEq/L — ABNORMAL LOW (ref 20.0–24.0)
DRAWN BY: 245131
FIO2: 0.5
MECHVT: 300 mL
O2 SAT: 97.6 %
PATIENT TEMPERATURE: 98.6
PCO2 ART: 37.6 mmHg (ref 35.0–45.0)
PEEP/CPAP: 8 cmH2O
PH ART: 7.342 — AB (ref 7.350–7.450)
PO2 ART: 103 mmHg — AB (ref 80.0–100.0)
RATE: 24 resp/min
TCO2: 21 mmol/L (ref 0–100)

## 2014-12-12 LAB — LEGIONELLA ANTIGEN, URINE

## 2014-12-12 LAB — BASIC METABOLIC PANEL
ANION GAP: 9 (ref 5–15)
BUN: 23 mg/dL — ABNORMAL HIGH (ref 6–20)
CALCIUM: 8.4 mg/dL — AB (ref 8.9–10.3)
CHLORIDE: 111 mmol/L (ref 101–111)
CO2: 21 mmol/L — AB (ref 22–32)
CREATININE: 1.12 mg/dL — AB (ref 0.44–1.00)
GFR calc non Af Amer: 47 mL/min — ABNORMAL LOW (ref >60)
GFR, EST AFRICAN AMERICAN: 55 mL/min — AB (ref >60)
GLUCOSE: 178 mg/dL — AB (ref 65–99)
Potassium: 4.2 mmol/L (ref 3.5–5.1)
Sodium: 141 mmol/L (ref 135–145)

## 2014-12-12 LAB — GLUCOSE, CAPILLARY
GLUCOSE-CAPILLARY: 168 mg/dL — AB (ref 65–99)
Glucose-Capillary: 172 mg/dL — ABNORMAL HIGH (ref 65–99)
Glucose-Capillary: 161 mg/dL — ABNORMAL HIGH (ref 65–99)
Glucose-Capillary: 165 mg/dL — ABNORMAL HIGH (ref 65–99)

## 2014-12-12 LAB — URINE MICROSCOPIC-ADD ON

## 2014-12-12 LAB — HEPATIC FUNCTION PANEL
ALK PHOS: 104 U/L (ref 38–126)
ALT: 48 U/L (ref 14–54)
AST: 76 U/L — AB (ref 15–41)
Albumin: 1.6 g/dL — ABNORMAL LOW (ref 3.5–5.0)
BILIRUBIN DIRECT: 0.2 mg/dL (ref 0.1–0.5)
BILIRUBIN TOTAL: 0.4 mg/dL (ref 0.3–1.2)
Indirect Bilirubin: 0.2 mg/dL — ABNORMAL LOW (ref 0.3–0.9)
Total Protein: 5.9 g/dL — ABNORMAL LOW (ref 6.5–8.1)

## 2014-12-12 LAB — PROCALCITONIN: Procalcitonin: 22.95 ng/mL

## 2014-12-12 MED ORDER — ADULT MULTIVITAMIN LIQUID CH
5.0000 mL | Freq: Every day | ORAL | Status: DC
  Administered 2014-12-12 – 2014-12-19 (×8): 5 mL
  Filled 2014-12-12 (×10): qty 5

## 2014-12-12 MED ORDER — FUROSEMIDE 10 MG/ML IJ SOLN
40.0000 mg | Freq: Every day | INTRAMUSCULAR | Status: DC
  Administered 2014-12-12: 40 mg via INTRAVENOUS
  Filled 2014-12-12 (×2): qty 4

## 2014-12-12 MED ORDER — SODIUM CHLORIDE 0.9 % IV BOLUS (SEPSIS)
1000.0000 mL | Freq: Once | INTRAVENOUS | Status: AC
  Administered 2014-12-12: 1000 mL via INTRAVENOUS

## 2014-12-12 MED ORDER — PRO-STAT SUGAR FREE PO LIQD
30.0000 mL | Freq: Two times a day (BID) | ORAL | Status: DC
  Administered 2014-12-12 – 2014-12-19 (×16): 30 mL
  Filled 2014-12-12 (×19): qty 30

## 2014-12-12 MED ORDER — VITAL HIGH PROTEIN PO LIQD
1000.0000 mL | ORAL | Status: DC
  Administered 2014-12-13: 08:00:00
  Administered 2014-12-13: 1000 mL
  Administered 2014-12-13: 04:00:00
  Administered 2014-12-13 – 2014-12-14 (×2): 1000 mL
  Administered 2014-12-15 (×10)
  Administered 2014-12-16 – 2014-12-19 (×5): 1000 mL
  Filled 2014-12-12 (×10): qty 1000

## 2014-12-12 NOTE — Progress Notes (Signed)
Patient biting down on tongue, bite block placed.

## 2014-12-12 NOTE — Progress Notes (Addendum)
Initial Nutrition Assessment  DOCUMENTATION CODES:   Obesity unspecified  INTERVENTION:    Decrease Vital High Protein formula to goal rate of 35 ml/hr   Prostat liquid protein 30 ml BID  Liquid MVI daily   Total TF regimen to provide 1040 kcals, 103 gm protein, 702 ml of free water  NUTRITION DIAGNOSIS:   Inadequate oral intake related to inability to eat as evidenced by NPO status  GOAL:   Provide needs based on ASPEN/SCCM guidelines  MONITOR:   TF tolerance, Vent status, Labs, Weight trends, I & O's  REASON FOR ASSESSMENT:   Consult Enteral/tube feeding initiation and management  ASSESSMENT:   74 y.o.Female w sig h/o HTN.DM, schizophrenia. Admitted to IMTS on 8/19 w/ working dx of CAP. Developed progressive airspace disease on CXR and worsening hypoxia on 8/20. PCCM called and pt transferred to the ICU for emergent intubation.   Patient is currently intubated on ventilator support MV: 8.0 L/min Temp (24hrs), Avg:99.9 F (37.7 C), Min:97.7 F (36.5 C), Max:101.8 F (38.8 C)   Vital High Protein formula initiated via Adult Tube Feeding Protocol 8/21.  Currently infusing at 40 ml/hr via OGT providing 960 kcals, 84 gm protein, 803 ml of free water.  Limited Nutrition Focused Physical Exam completed.  No muscle or subcutaneous fat depletion noticed.  Diet Order:  Diet NPO time specified  Skin:  Reviewed, no issues  Last BM:  8/17  Height:   Ht Readings from Last 1 Encounters:  12/10/14 5\' 2"  (1.575 m)    Weight:   Wt Readings from Last 1 Encounters:  12/12/14 179 lb 14.3 oz (81.6 kg)    Ideal Body Weight:  50 kg  BMI:  Body mass index is 32.89 kg/(m^2).  Estimated Nutritional Needs:   Kcal:  [phone]  Protein:  100-110 gm  Fluid:  per MD  EDUCATION NEEDS:   No education needs identified at this time  Arthur Holms, RD, LDN Pager #: [phone] After-Hours Pager #: [phone]

## 2014-12-12 NOTE — Progress Notes (Signed)
eLink Physician-Brief Progress Note Patient Name: [name] DOB: Apr 20, 1941 MRN: [id number]   Date of Service  12/12/2014  HPI/Events of Note  Hypotension - BP = 83/55.   eICU Interventions  Will bolus with 0.9 NaCl 1 liter IV over 1 hour now.      Intervention Category Intermediate Interventions: Hypotension - evaluation and management  Sommer,Steven Eugene 12/12/2014, 3:51 AM

## 2014-12-12 NOTE — Progress Notes (Signed)
PULMONARY / CRITICAL CARE MEDICINE   Name: [name] MRN: [id number] DOB: Aug 20, 1940    ADMISSION DATE:  12/09/2014 CONSULTATION DATE:  8/20  REFERRING MD :  Evette Doffing   CHIEF COMPLAINT:  Hypoxic respiratory failure   INITIAL PRESENTATION:  74 year old female w sig h/o HTN.DM, schizophrenia. Admitted to IMTS on 8/19 w/ working dx of CAP. Developed progressive airspace disease on CXR and worsening hypoxia on 8/20. PCCM called and pt transferred to the ICU for emergent intubation.   STUDIES:  Head CT scan 8/20 >> no acute intracranial abnormality  SIGNIFICANT EVENTS: 8/20 intubated for respiratory failure  SUBJECTIVE: 8/20 intubated for respiratory failure, heavily sedated, desaturated overnight with vent dyssynchrony  VITAL SIGNS: Temp:  [97.7 F (36.5 C)-101.8 F (38.8 C)] 99.3 F (37.4 C) (08/22 0830) Pulse Rate:  [61-112] 97 (08/22 0830) Resp:  [15-31] 21 (08/22 0830) BP: (83-172)/(37-84) 140/55 mmHg (08/22 0830) SpO2:  [90 %-100 %] 98 % (08/22 0830) FiO2 (%):  [50 %-60 %] 50 % (08/22 0740) Weight:  [179 lb 14.3 oz (81.6 kg)] 179 lb 14.3 oz (81.6 kg) (08/22 0400) HEMODYNAMICS:   VENTILATOR SETTINGS: Vent Mode:  [-] PRVC FiO2 (%):  [50 %-60 %] 50 % Set Rate:  [24 bmp] 24 bmp Vt Set:  [300 mL] 300 mL PEEP:  [8 cmH20] 8 cmH20 Plateau Pressure:  [16 cmH20-20 cmH20] 16 cmH20 INTAKE / OUTPUT:  Intake/Output Summary (Last 24 hours) at 12/12/14 0930 Last data filed at 12/12/14 0900  Gross per 24 hour  Intake 5024.66 ml  Output    705 ml  Net 4319.66 ml    PHYSICAL EXAMINATION: General:  Sedated on vent HENT: NCAT, PERRL, ETT in place PULM: coarse crackles left, vent supported breaths CV: RRR, no mgr GI: BS+, soft nontender MSK: normal bulk and tone Derm: no rash Neuro: sedated on vent, moves limbs spontaneously  LABS:  CBC  Recent Labs Lab 12/10/14 0226 12/11/14 0200 12/12/14 0251  WBC 20.1* 22.3* 25.4*  HGB 9.2* 8.0* 7.6*  HCT 29.1* 25.1* 24.8*   PLT 375 354 383   Coag's No results for input(s): APTT, INR in the last 168 hours. BMET  Recent Labs Lab 12/10/14 0226 12/11/14 0200 12/12/14 0251  NA 142 142 141  K 4.1 4.0 4.2  CL 115* 113* 111  CO2 20* 19* 21*  BUN 15 16 23*  CREATININE 0.98 1.11* 1.12*  GLUCOSE 116* 118* 178*   Electrolytes  Recent Labs Lab 12/10/14 0226 12/11/14 0200 12/12/14 0251  CALCIUM 8.3* 8.4* 8.4*   Sepsis Markers  Recent Labs Lab 12/09/14 1704 12/09/14 1757 12/09/14 2054 12/10/14 2311 12/11/14 0200 12/12/14 0251  LATICACIDVEN 2.46* 3.56* 1.9  --   --   --   PROCALCITON  --   --   --  23.23 27.36 22.95   ABG  Recent Labs Lab 12/10/14 2345 12/11/14 0316 12/12/14 0348  PHART 7.228* 7.304* 7.342*  PCO2ART 49.7* 41.4 37.6  PO2ART 123* 78.8* 103*   Liver Enzymes  Recent Labs Lab 12/09/14 1426 12/10/14 0226  AST 29 29  ALT 24 22  ALKPHOS 109 107  BILITOT 0.3 0.3  ALBUMIN 2.1* 1.9*   Cardiac Enzymes No results for input(s): TROPONINI, PROBNP in the last 168 hours. Glucose  Recent Labs Lab 12/11/14 1206 12/11/14 1604 12/11/14 2005 12/11/14 2349 12/12/14 0312 12/12/14 0724  GLUCAP 95 128* 188* 164* 172* 161*    Imaging 8/21 CXR personally reviewed> ETT in place, left lung consolidation noted, clear on  right   ASSESSMENT / PLAN:  PULMONARY OETT  A: Severe CAP Acute hypoxic respiratory failure in setting of CAP P:   Full vent support Maintain PEEP 8, FiO2 50% today, goal O2 sat > 90% VAP bundle Will start diuresis to help with the weaning.   CARDIOVASCULAR CVL  A:  Severe sepsis without shock, lactic acid improved with IVF P:  Change IVF to Saline Continue Tele No need for CVL  RENAL A:   Mild increase in Renal function, likely related to sepsis P:   Change LR back to saline Monitor BMET and UOP Replace electrolytes as needed  GASTROINTESTINAL A:   No acute issues P:   Place OGT Continue Tubefeeds  PPI for SUP   HEMATOLOGIC A:    Normocytic anemia without bleeding P:  Monitor for bleeding Transfusion threshold for Hgb < 7gm/dL  INFECTIOUS A:   Severe CAP Severe Sepsis  Ustrep >> neg  P:   BCx2 8/19>>> UC 8/19>>> u legionella 8/19>>> Sputum 8/19>>>  azithro 8/19>>> Rocephin 8/19>>> (increase dose to 2gm 8/21) vanc 8/20>>>  WBC count higher today. Will check blood cultures, UA and C diff (if she has a BM) ICU Procalcitonin protocol  ENDOCRINE A:   DM P:   Trend glucose  ssi   NEUROLOGIC A:   Acute encephalopathy Vent dyssynchrony P:   RASS goal: -1  Fentanyl gtt and prn  PRN versed D/c propofol Supportive care    FAMILY  - Updates: Updated son at bedside and daughter over the phone 8/22 - Inter-disciplinary family meet or Palliative Care meeting due by:  8/27  My CC time is 45 minutes.  Marshell Garfinkel MD [place] Pager [phone] If no answer or after 3pm call: [phone] 12/12/2014, 9:30 AM

## 2014-12-13 ENCOUNTER — Inpatient Hospital Stay (HOSPITAL_COMMUNITY): Payer: Medicare Other

## 2014-12-13 DIAGNOSIS — I509 Heart failure, unspecified: Secondary | ICD-10-CM

## 2014-12-13 LAB — POCT I-STAT 3, ART BLOOD GAS (G3+)
ACID-BASE DEFICIT: 6 mmol/L — AB (ref 0.0–2.0)
Bicarbonate: 19.4 mEq/L — ABNORMAL LOW (ref 20.0–24.0)
O2 SAT: 93 %
PH ART: 7.289 — AB (ref 7.350–7.450)
PO2 ART: 80 mmHg (ref 80.0–100.0)
TCO2: 21 mmol/L (ref 0–100)
pCO2 arterial: 41.1 mmHg (ref 35.0–45.0)

## 2014-12-13 LAB — BLOOD GAS, ARTERIAL
Acid-base deficit: 4.5 mmol/L — ABNORMAL HIGH (ref 0.0–2.0)
BICARBONATE: 19.7 meq/L — AB (ref 20.0–24.0)
Drawn by: 365271
FIO2: 0.5
MECHVT: 400 mL
O2 SAT: 94.4 %
PATIENT TEMPERATURE: 100
PCO2 ART: 35.1 mmHg (ref 35.0–45.0)
PEEP: 8 cmH2O
PH ART: 7.372 (ref 7.350–7.450)
RATE: 24 resp/min
TCO2: 20.7 mmol/L (ref 0–100)
pO2, Arterial: 74.8 mmHg — ABNORMAL LOW (ref 80.0–100.0)

## 2014-12-13 LAB — CBC
HCT: 26.4 % — ABNORMAL LOW (ref 36.0–46.0)
HEMOGLOBIN: 8 g/dL — AB (ref 12.0–15.0)
MCH: 30.4 pg (ref 26.0–34.0)
MCHC: 30.3 g/dL (ref 30.0–36.0)
MCV: 100.4 fL — ABNORMAL HIGH (ref 78.0–100.0)
PLATELETS: 444 10*3/uL — AB (ref 150–400)
RBC: 2.63 MIL/uL — AB (ref 3.87–5.11)
RDW: 16.2 % — ABNORMAL HIGH (ref 11.5–15.5)
WBC: 25.4 10*3/uL — AB (ref 4.0–10.5)

## 2014-12-13 LAB — GLUCOSE, CAPILLARY
GLUCOSE-CAPILLARY: 169 mg/dL — AB (ref 65–99)
GLUCOSE-CAPILLARY: 223 mg/dL — AB (ref 65–99)
GLUCOSE-CAPILLARY: 231 mg/dL — AB (ref 65–99)
GLUCOSE-CAPILLARY: 296 mg/dL — AB (ref 65–99)
Glucose-Capillary: 272 mg/dL — ABNORMAL HIGH (ref 65–99)
Glucose-Capillary: 262 mg/dL — ABNORMAL HIGH (ref 65–99)
Glucose-Capillary: 183 mg/dL — ABNORMAL HIGH (ref 65–99)
Glucose-Capillary: 179 mg/dL — ABNORMAL HIGH (ref 65–99)

## 2014-12-13 LAB — BASIC METABOLIC PANEL
ANION GAP: 7 (ref 5–15)
BUN: 26 mg/dL — AB (ref 6–20)
CHLORIDE: 115 mmol/L — AB (ref 101–111)
CO2: 21 mmol/L — ABNORMAL LOW (ref 22–32)
Calcium: 8.8 mg/dL — ABNORMAL LOW (ref 8.9–10.3)
Creatinine, Ser: 1.12 mg/dL — ABNORMAL HIGH (ref 0.44–1.00)
GFR, EST AFRICAN AMERICAN: 55 mL/min — AB (ref >60)
GFR, EST NON AFRICAN AMERICAN: 47 mL/min — AB (ref >60)
Glucose, Bld: 171 mg/dL — ABNORMAL HIGH (ref 65–99)
POTASSIUM: 3.9 mmol/L (ref 3.5–5.1)
SODIUM: 143 mmol/L (ref 135–145)

## 2014-12-13 LAB — PROCALCITONIN: Procalcitonin: 9.08 ng/mL

## 2014-12-13 LAB — VANCOMYCIN, TROUGH: Vancomycin Tr: 20 ug/mL (ref 10.0–20.0)

## 2014-12-13 MED ORDER — FUROSEMIDE 10 MG/ML IJ SOLN
40.0000 mg | Freq: Two times a day (BID) | INTRAMUSCULAR | Status: DC
  Administered 2014-12-13 – 2014-12-20 (×16): 40 mg via INTRAVENOUS
  Filled 2014-12-13 (×18): qty 4

## 2014-12-13 MED ORDER — VANCOMYCIN HCL 10 G IV SOLR
1500.0000 mg | INTRAVENOUS | Status: DC
  Administered 2014-12-14: 1500 mg via INTRAVENOUS
  Filled 2014-12-13 (×2): qty 1500

## 2014-12-13 MED ORDER — ALBUMIN HUMAN 25 % IV SOLN
12.5000 g | Freq: Once | INTRAVENOUS | Status: AC
  Administered 2014-12-13: 12.5 g via INTRAVENOUS
  Filled 2014-12-13: qty 50

## 2014-12-13 MED ORDER — SODIUM CHLORIDE 0.9 % IV BOLUS (SEPSIS)
500.0000 mL | Freq: Once | INTRAVENOUS | Status: AC
  Administered 2014-12-13: 500 mL via INTRAVENOUS

## 2014-12-13 MED ORDER — DEXMEDETOMIDINE HCL IN NACL 400 MCG/100ML IV SOLN
0.4000 ug/kg/h | INTRAVENOUS | Status: DC
  Administered 2014-12-13 (×4): 0.4 ug/kg/h via INTRAVENOUS
  Administered 2014-12-14 (×2): 0.5 ug/kg/h via INTRAVENOUS
  Administered 2014-12-14: 0.7 ug/kg/h via INTRAVENOUS
  Administered 2014-12-14: 0.6 ug/kg/h via INTRAVENOUS
  Administered 2014-12-15 (×2): 0.5 ug/kg/h via INTRAVENOUS
  Administered 2014-12-15: 0.7 ug/kg/h via INTRAVENOUS
  Administered 2014-12-15 (×2): 0.5 ug/kg/h via INTRAVENOUS
  Administered 2014-12-15: 0.7 ug/kg/h via INTRAVENOUS
  Administered 2014-12-16: 0.9 ug/kg/h via INTRAVENOUS
  Administered 2014-12-16: 0.8 ug/kg/h via INTRAVENOUS
  Administered 2014-12-16: 0.602 ug/kg/h via INTRAVENOUS
  Administered 2014-12-16: 0.7 ug/kg/h via INTRAVENOUS
  Administered 2014-12-16 – 2014-12-17 (×5): 0.9 ug/kg/h via INTRAVENOUS
  Administered 2014-12-17: 1.2 ug/kg/h via INTRAVENOUS
  Administered 2014-12-17 – 2014-12-18 (×2): 0.7 ug/kg/h via INTRAVENOUS
  Administered 2014-12-18: 0.8 ug/kg/h via INTRAVENOUS
  Administered 2014-12-18: 1 ug/kg/h via INTRAVENOUS
  Administered 2014-12-18: 0.8 ug/kg/h via INTRAVENOUS
  Administered 2014-12-19: 0.7 ug/kg/h via INTRAVENOUS
  Administered 2014-12-19: 1.1 ug/kg/h via INTRAVENOUS
  Administered 2014-12-19: 0.6 ug/kg/h via INTRAVENOUS
  Administered 2014-12-19: 0.4 ug/kg/h via INTRAVENOUS
  Administered 2014-12-19: 1.1 ug/kg/h via INTRAVENOUS
  Filled 2014-12-13: qty 50
  Filled 2014-12-13 (×2): qty 100
  Filled 2014-12-13 (×2): qty 50
  Filled 2014-12-13: qty 100
  Filled 2014-12-13 (×2): qty 50
  Filled 2014-12-13 (×2): qty 100
  Filled 2014-12-13 (×3): qty 50
  Filled 2014-12-13: qty 100
  Filled 2014-12-13 (×2): qty 50
  Filled 2014-12-13 (×2): qty 100
  Filled 2014-12-13: qty 50
  Filled 2014-12-13 (×9): qty 100
  Filled 2014-12-13 (×3): qty 50
  Filled 2014-12-13: qty 100
  Filled 2014-12-13: qty 50
  Filled 2014-12-13 (×4): qty 100

## 2014-12-13 NOTE — Progress Notes (Signed)
eLink Physician-Brief Progress Note Patient Name: [name] DOB: Nov 14, 1940 MRN: [id number]   Date of Service  12/13/2014  HPI/Events of Note  Oliguria.  eICU Interventions  Bolus with 0.9 NaCl 500 mL IV over 30 minutes now.     Intervention Category Intermediate Interventions: Oliguria - evaluation and management  [name] 12/13/2014, 5:59 AM

## 2014-12-13 NOTE — Progress Notes (Signed)
ANTIBIOTIC CONSULT NOTE - FOLLOW UP  Pharmacy Consult:  Vancomycin Indication:  CAP / sepsis  Allergies  Allergen Reactions  . Codeine Phosphate Other (See Comments)    REACTION: unspecified  . Glimepiride Other (See Comments)    REACTION: unspecified  . Glyburide Other (See Comments)    REACTION: unspecified  . Shellfish Allergy Swelling  . Tramadol Other (See Comments)    unknown    Patient Measurements: Height: 5\' 2"  (157.5 cm) Weight: 188 lb 15 oz (85.7 kg) IBW/kg (Calculated) : 50.1  Vital Signs: Temp: 98.6 F (37 C) (08/23 1800) Temp Source: Core (Comment) (08/23 1200) BP: 143/52 mmHg (08/23 1800) Pulse Rate: 69 (08/23 1800) Intake/Output from previous day: 08/22 0701 - 08/23 0700 In: 5297.5 [I.V.:2732.5; NG/GT:1065; IV Piggyback:1500] Out: 1100 [Urine:1100]  Labs:  Recent Labs  12/11/14 0200 12/12/14 0251 12/13/14 0221  WBC 22.3* 25.4* 25.4*  HGB 8.0* 7.6* 8.0*  PLT 354 383 444*  CREATININE 1.11* 1.12* 1.12*   Estimated Creatinine Clearance: 44.7 mL/min (by C-G formula based on Cr of 1.12).  Recent Labs  12/13/14 1738  Martin 20     Microbiology: Recent Results (from the past 720 hour(s))  Culture, blood (routine x 2) Call MD if unable to obtain prior to antibiotics being given     Status: None (Preliminary result)   Collection Time: 12/09/14  2:00 PM  Result Value Ref Range Status   Specimen Description BLOOD RIGHT ARM  Final   Special Requests BOTTLES DRAWN AEROBIC AND ANAEROBIC 5CC  Final   Culture NO GROWTH 3 DAYS  Final   Report Status PENDING  Incomplete  Culture, blood (routine x 2) Call MD if unable to obtain prior to antibiotics being given     Status: None (Preliminary result)   Collection Time: 12/09/14  2:15 PM  Result Value Ref Range Status   Specimen Description BLOOD LEFT ARM  Final   Special Requests BOTTLES DRAWN AEROBIC AND ANAEROBIC 10CC  Final   Culture NO GROWTH 3 DAYS  Final   Report Status PENDING  Incomplete   Urine culture     Status: None   Collection Time: 12/09/14  5:20 PM  Result Value Ref Range Status   Specimen Description URINE, CATHETERIZED  Final   Special Requests NONE  Final   Culture NO GROWTH 2 DAYS  Final   Report Status 12/11/2014 FINAL  Final  MRSA PCR Screening     Status: None   Collection Time: 12/09/14 10:05 PM  Result Value Ref Range Status   MRSA by PCR NEGATIVE NEGATIVE Final    Comment:        The GeneXpert MRSA Assay (FDA approved for NASAL specimens only), is one component of a comprehensive MRSA colonization surveillance program. It is not intended to diagnose MRSA infection nor to guide or monitor treatment for MRSA infections.   Culture, blood (routine x 2)     Status: None (Preliminary result)   Collection Time: 12/12/14 11:35 AM  Result Value Ref Range Status   Specimen Description BLOOD LEFT FOREARM  Final   Special Requests BOTTLES DRAWN AEROBIC ONLY 5CC  Final   Culture NO GROWTH 1 DAY  Final   Report Status PENDING  Incomplete  Culture, blood (routine x 2)     Status: None (Preliminary result)   Collection Time: 12/12/14 11:40 AM  Result Value Ref Range Status   Specimen Description BLOOD RIGHT ANTECUBITAL  Final   Special Requests BOTTLES DRAWN AEROBIC AND ANAEROBIC 5CC  Final   Culture NO GROWTH 1 DAY  Final   Report Status PENDING  Incomplete  Culture, respiratory (NON-Expectorated)     Status: None (Preliminary result)   Collection Time: 12/12/14 11:50 AM  Result Value Ref Range Status   Specimen Description TRACHEAL ASPIRATE  Final   Special Requests NONE  Final   Gram Stain   Final    ABUNDANT WBC PRESENT,BOTH PMN AND MONONUCLEAR NO SQUAMOUS EPITHELIAL CELLS SEEN NO ORGANISMS SEEN Performed at [facility]    Culture PENDING  Incomplete   Report Status PENDING  Incomplete      Assessment: 61 YOF continues on vancomycin, azithromycin, and ceftriaxone for CAP and sepsis.  Vancomycin trough is high normal and his renal  function has been stable.  Ceftriaxone 8/19 >> Azithromycin 8/19 >> Vancomycin 8/20 >>  8/23 VT = 20 mcg/mL on 750mg  q12 >> 1500mg  q24  Blood x 2 8/19>NGTD Blood x2 8/22>> TA 8/22>> Urine 8/19>>NGF Negative PCR for MRSA Urine Strep negative Legionella-> in process   Goal of Therapy:  Vancomycin trough level 15-20 mcg/ml   Plan:  - Change vanc to 1500mg  IV Q24H, start tomorrow - Continue azithromycin and Rocephin as ordered - Monitor renal fxn, clinical progress, repeat vanc trough as indicated    Chuckie Mccathern D. Mina Marble, PharmD, BCPS Pager:  [phone] 12/13/2014, 7:37 PM

## 2014-12-13 NOTE — Progress Notes (Signed)
PULMONARY / CRITICAL CARE MEDICINE   Name: [name] MRN: [id number] DOB: [birth year]    ADMISSION DATE:  12/09/2014 CONSULTATION DATE:  8/20  REFERRING MD :  Evette Doffing   CHIEF COMPLAINT:  Hypoxic respiratory failure   INITIAL PRESENTATION:  74 year old female w sig h/o HTN.DM, schizophrenia. Admitted to IMTS on 8/19 w/ working dx of CAP. Developed progressive airspace disease on CXR and worsening hypoxia on 8/20. PCCM called and pt transferred to the ICU for emergent intubation.   STUDIES:  Head CT scan 8/20 >> no acute intracranial abnormality  SIGNIFICANT EVENTS: 8/20 intubated for respiratory failure  SUBJECTIVE:  8/23. Has issues with vent dysynchrony. Vent adjusted with increased rate. Still has poor urine output. Given albumin and fluid bolus.  VITAL SIGNS: Temp:  [98.2 F (36.8 C)-102 F (38.9 C)] 98.2 F (36.8 C) (08/23 0700) Pulse Rate:  [72-115] 74 (08/23 0714) Resp:  [11-26] 24 (08/23 0714) BP: (105-173)/(39-76) 133/55 mmHg (08/23 0714) SpO2:  [86 %-100 %] 98 % (08/23 0714) FiO2 (%):  [50 %] 50 % (08/23 0714) Weight:  [188 lb 15 oz (85.7 kg)] 188 lb 15 oz (85.7 kg) (08/23 0416) HEMODYNAMICS:   VENTILATOR SETTINGS: Vent Mode:  [-] PRVC FiO2 (%):  [50 %] 50 % Set Rate:  [24 bmp] 24 bmp Vt Set:  [300 mL-400 mL] 400 mL PEEP:  [8 cmH20] 8 cmH20 Plateau Pressure:  [17 cmH20-22 cmH20] 21 cmH20 INTAKE / OUTPUT:  Intake/Output Summary (Last 24 hours) at 12/13/14 0823 Last data filed at 12/13/14 0700  Gross per 24 hour  Intake 5145.01 ml  Output   1100 ml  Net 4045.01 ml    PHYSICAL EXAMINATION: General:  Sedated on vent HENT: NCAT, PERRL, ETT in place PULM: coarse crackles left, vent supported breaths CV: RRR, no mgr GI: BS+, soft nontender MSK: normal bulk and tone Derm: Intact, no rash Neuro: sedated on vent, moves limbs spontaneously  LABS:  CBC  Recent Labs Lab 12/11/14 0200 12/12/14 0251 12/13/14 0221  WBC 22.3* 25.4* 25.4*  HGB 8.0*  7.6* 8.0*  HCT 25.1* 24.8* 26.4*  PLT 354 383 444*   Coag's No results for input(s): APTT, INR in the last 168 hours. BMET  Recent Labs Lab 12/11/14 0200 12/12/14 0251 12/13/14 0221  NA 142 141 143  K 4.0 4.2 3.9  CL 113* 111 115*  CO2 19* 21* 21*  BUN 16 23* 26*  CREATININE 1.11* 1.12* 1.12*  GLUCOSE 118* 178* 171*   Electrolytes  Recent Labs Lab 12/11/14 0200 12/12/14 0251 12/13/14 0221  CALCIUM 8.4* 8.4* 8.8*   Sepsis Markers  Recent Labs Lab 12/09/14 1704 12/09/14 1757 12/09/14 2054 12/10/14 2311 12/11/14 0200 12/12/14 0251  LATICACIDVEN 2.46* 3.56* 1.9  --   --   --   PROCALCITON  --   --   --  23.23 27.36 22.95   ABG  Recent Labs Lab 12/12/14 0348 12/13/14 0304 12/13/14 0422  PHART 7.342* 7.289* 7.372  PCO2ART 37.6 41.1 35.1  PO2ART 103* 80.0 74.8*   Liver Enzymes  Recent Labs Lab 12/09/14 1426 12/10/14 0226 12/12/14 1135  AST 29 29 76*  ALT 24 22 48  ALKPHOS 109 107 104  BILITOT 0.3 0.3 0.4  ALBUMIN 2.1* 1.9* 1.6*   Cardiac Enzymes No results for input(s): TROPONINI, PROBNP in the last 168 hours. Glucose  Recent Labs Lab 12/12/14 0724 12/12/14 1202 12/12/14 1620 12/12/14 1922 12/12/14 2308 12/13/14 0350  GLUCAP 161* 165* 168* 272* 169* 223*  Imaging 8/23 CXR personally reviewed> ETT in place, left lung consolidation noted, worsened opacities on left c/w pneumonia vs pulmonary edema.   ASSESSMENT / PLAN: PULMONARY OETT  A: Severe CAP Acute hypoxic respiratory failure in setting of CAP P:   Full vent support. ABG better after rate increase. WIll be able to decrease PEEP to 5.  VAP bundle Will start diuresis to help with the weaning.  Lasix 40 IV bid.  CARDIOVASCULAR CVL  A:  Severe sepsis without shock, lactic acid improved with IVF P:  Change IVF to Saline Continue Tele No need for CVL Check echocardiogram  RENAL A:   Mild increase in Renal function, likely related to sepsis P:   Monitor BMET and  UOP Replace electrolytes as needed  GASTROINTESTINAL LFTs are normal. A:   No acute issues P:   Continue Tubefeeds  PPI for SUP   HEMATOLOGIC A:   Normocytic anemia without bleeding P:  Monitor for bleeding Transfusion threshold for Hgb < 7gm/dL  INFECTIOUS A:   Severe CAP Severe Sepsis  Ustrep >> neg  P:   BCx2 8/19>>> UC 8/19>>> u legionella 8/19>>> Sputum 8/19>>>  azithro 8/19>>> Rocephin 8/19>>> (increase dose to 2gm 8/21) vanc 8/20>>>  WBC count remains high today. All cultures remain negative. ICU Procalcitonin protocol  ENDOCRINE A:   DM P:   Trend glucose  ssi   NEUROLOGIC A:   Acute encephalopathy Vent dyssynchrony P:   RASS goal: -1  Fentanyl gtt and prn. Stop sedation and wake up for breathing trials PRN versed Supportive care    FAMILY  - Updates: Updated son at bedside and daughter over the phone 8/22 - Inter-disciplinary family meet or Palliative Care meeting due by:  8/27  My CC time is 45 minutes.  Marshell Garfinkel MD [place] [facility] Pager [phone] If no answer or after 3pm call: [phone] 12/13/2014, 8:23 AM

## 2014-12-13 NOTE — Progress Notes (Signed)
ANTIBIOTIC CONSULT NOTE - follow up Indication: pneumonia  Allergies  Allergen Reactions  . Codeine Phosphate Other (See Comments)    REACTION: unspecified  . Glimepiride Other (See Comments)    REACTION: unspecified  . Glyburide Other (See Comments)    REACTION: unspecified  . Shellfish Allergy Swelling  . Tramadol Other (See Comments)    unknown   Patient Measurements: Height: 5\' 2"  (157.5 cm) Weight: 188 lb 15 oz (85.7 kg) IBW/kg (Calculated) : 50.1  Vital Signs: Temp: 98.2 F (36.8 C) (08/23 0700) Temp Source: Core (Comment) (08/23 0500) BP: 133/55 mmHg (08/23 0714) Pulse Rate: 74 (08/23 0714) Intake/Output from previous day: 08/22 0701 - 08/23 0700 In: 5297.5 [I.V.:2732.5; NG/GT:1065; IV Piggyback:1500] Out: 1100 [Urine:1100]  Labs:  Recent Labs  12/11/14 0200 12/12/14 0251 12/13/14 0221  WBC 22.3* 25.4* 25.4*  HGB 8.0* 7.6* 8.0*  PLT 354 383 444*  CREATININE 1.11* 1.12* 1.12*   Estimated Creatinine Clearance: 44.7 mL/min (by C-G formula based on Cr of 1.12).  Microbiology: Recent Results (from the past 720 hour(s))  Culture, blood (routine x 2) Call MD if unable to obtain prior to antibiotics being given     Status: None (Preliminary result)   Collection Time: 12/09/14  2:00 PM  Result Value Ref Range Status   Specimen Description BLOOD RIGHT ARM  Final   Special Requests BOTTLES DRAWN AEROBIC AND ANAEROBIC 5CC  Final   Culture NO GROWTH 2 DAYS  Final   Report Status PENDING  Incomplete  Culture, blood (routine x 2) Call MD if unable to obtain prior to antibiotics being given     Status: None (Preliminary result)   Collection Time: 12/09/14  2:15 PM  Result Value Ref Range Status   Specimen Description BLOOD LEFT ARM  Final   Special Requests BOTTLES DRAWN AEROBIC AND ANAEROBIC 10CC  Final   Culture NO GROWTH 2 DAYS  Final   Report Status PENDING  Incomplete  Urine culture     Status: None   Collection Time: 12/09/14  5:20 PM  Result Value Ref  Range Status   Specimen Description URINE, CATHETERIZED  Final   Special Requests NONE  Final   Culture NO GROWTH 2 DAYS  Final   Report Status 12/11/2014 FINAL  Final  MRSA PCR Screening     Status: None   Collection Time: 12/09/14 10:05 PM  Result Value Ref Range Status   MRSA by PCR NEGATIVE NEGATIVE Final    Comment:        The GeneXpert MRSA Assay (FDA approved for NASAL specimens only), is one component of a comprehensive MRSA colonization surveillance program. It is not intended to diagnose MRSA infection nor to guide or monitor treatment for MRSA infections.    Assessment: 74yo female admitted with weakness, cough and general malaise.  Started on Ceftriaxone and Azithromycin for CAP 8/19, vancomycin added on 8/20 for severe CAP/sepsis. Tmax 100.4 WBC remains elevated at 25.4. , PCT remains elevated at 22.95; creat cl 50. New report at 03am of minimal UOP- oliguria, pt edematous, albumin 12.5 gm given, then NS 500 ml given- UOP still minimal, will hold vanc and check trough- dose given this am at 0608 am, will check VT at 1730 and hold dose until resulted.  I am concerned she has accumulated vancomycin with this decreased UOP.   Ceftriaxone 8/19(incr dose to 2 gm 8/21)>> Azithromycin 8/19>> Vancomycin 8/20>>  Blood x 2 8/19>NGTD Blood x2 8/22>> TA 8/22>> Urine 8/19>>NGF Negative PCR for MRSA  Urine Strep negative Legionella-> in process    Goal of Therapy:  Vancomycin trough level 15-20 mcg/ml  Plan:  -VT at 1730, hold dose until resulted - Vanc 750 mg q12 -Zithromax 500 mg iv Q 24 hours -Rocephin 2 grams iv Q 24 hours F/u renal, micro and clinical response  Eudelia Bunch, Pharm.D. [phone] 12/13/2014 8:00 AM

## 2014-12-13 NOTE — Progress Notes (Signed)
Pt U/O still minimal after albumin. E-link MD nofitied. BP 124/46 (65). Order given for 500cc NS bolus. Bolus given. Will continue to monitor.

## 2014-12-13 NOTE — Progress Notes (Addendum)
Per MD orders RT attempted patient at [facility] of +5. Patient's Sat dropped < 90. Per new MD verbal order at 08:56 the patient's Peep was put back at +8.  Per MD verbal order, RT withdrew ETT by 2 cm based on 12/13/14 AM chest xray.

## 2014-12-13 NOTE — Progress Notes (Addendum)
Pt dyssynchronous with vent. Stacking breaths. Lung sounds clear bilaterally. PRN versed given @ 0154, fent gtt titrated up. RT notified. E-link notified. Orders given for blood gas and changes to vent settings. Will continue to monitor. E-link MD also notified that U/O has been minimal for this shift. Pt edematous, lung sounds clear. BP 127/43 (64). Orders given for albumin. Will give and continue to monitor.

## 2014-12-13 NOTE — Progress Notes (Signed)
  Echocardiogram 2D Echocardiogram has been performed.  [name] 12/13/2014, 4:20 PM

## 2014-12-13 NOTE — Progress Notes (Deleted)
Inpatient Diabetes Program Recommendations  AACE/ADA: New Consensus Statement on Inpatient Glycemic Control (2013)  Target Ranges:  Prepandial:   less than 140 mg/dL      Peak postprandial:   less than 180 mg/dL (1-2 hours)      Critically ill patients:  140 - 180 mg/dL   Results for [name], [name] (MRN [id number]) as of 12/13/2014 09:11  Ref. Range 12/12/2014 23:08 12/13/2014 03:50 12/13/2014 08:18  Glucose-Capillary Latest Ref Range: 65-99 mg/dL 169 (H) 223 (H) 262 (H)    Admit with: Pneumonia/ Sepsis  History: DM, Schizophrenia, Dementia, HTN  Home DM Meds: 70/30 insulin- 25 units bidwc         Lantus 15 units QHS         Actos 30 mg daily  Current DM Orders: Novolog Bedtime SSI scale (0-5 units- start at CBG of 201 mg/dl) Q4 hours      MD- Please consider the following in-hospital insulin adjustments:  1. Change current Novolog SSI to Moderate scale (0-15 units) Q4 hours per Glycemic Control Order set  2. Start basal insulin at 0.1 units/kg dosing- Lantus 9 units daily     Will follow Wyn Quaker RN, MSN, CDE Diabetes Coordinator Inpatient Glycemic Control Team Team Pager: [phone] (8a-5p)

## 2014-12-13 NOTE — Care Management Important Message (Signed)
Important Message  Patient Details  Name: [name] MRN: [id number] Date of Birth: 1941-01-12   Medicare Important Message Given:  Yes-second notification given    Ella Bodo, RN 12/13/2014, 3:48 PM

## 2014-12-13 NOTE — Progress Notes (Signed)
Inpatient Diabetes Program Recommendations  AACE/ADA: New Consensus Statement on Inpatient Glycemic Control (2013)  Target Ranges:  Prepandial:   less than 140 mg/dL      Peak postprandial:   less than 180 mg/dL (1-2 hours)      Critically ill patients:  140 - 180 mg/dL   Results for KRIZIA, FLIGHT (MRN [id number]) as of 12/13/2014 09:11  Ref. Range 12/12/2014 07:24 12/12/2014 12:02 12/12/2014 16:20 12/12/2014 19:22 12/12/2014 23:08 12/13/2014 03:50 12/13/2014 08:18  Glucose-Capillary Latest Ref Range: 65-99 mg/dL 161 (H) 165 (H) 168 (H) 272 (H) 169 (H) 223 (H) 262 (H)   Diabetes history: DM2 Outpatient Diabetes medications: 70/30 25 units BID, Lantus 15 units QHS Current orders for Inpatient glycemic control: Novolog 0-5 units Q4H  Inpatient Diabetes Program Recommendations Correction (SSI): Please consider discontinuing current Novolog 0-5 units Q4H correction scale and use ICU Glycemic Control order set to order Novolog correction Q4H to improve inpatient glycemic control. Insulin - Meal Coverage: While receiving tube feeding, please consider ordering Novolog 3 units Q4H for tube feeding coverage.  Thanks, Barnie Alderman, RN, MSN, CCRN, CDE Diabetes Coordinator Inpatient Diabetes Program [phone] (Team Pager from [place] to [place]) [phone] ([facility] office) [phone] [facility] office) [phone] [facility] office)

## 2014-12-13 NOTE — Progress Notes (Signed)
eLink Physician-Brief Progress Note Patient Name: [name] DOB: October 23, 1940 MRN: [id number]   Date of Service  12/13/2014  HPI/Events of Note  Multiple issues: 1. Patient dyssynchronous with ventilator and 2. Oliguria - patient edematous. ABG on 50%/PRVC 18/TV 400/P 8 = 7.28/41/80 with Ppeak = 24 and Pplat = 21. TV raised to 400 and patient in less dyssynchrony with ventilator.   eICU Interventions  Will order: 1. Leave TV at 400 and increase rate to 24. 2. ABG at 4:30 AM. 3. Albumin 25% 12.5 gm IV now.      Intervention Category Intermediate Interventions: Oliguria - evaluation and management;Respiratory distress - evaluation and management  Avett Reineck Cornelia Copa 12/13/2014, 3:18 AM

## 2014-12-14 ENCOUNTER — Inpatient Hospital Stay (HOSPITAL_COMMUNITY): Payer: Medicare Other

## 2014-12-14 LAB — CBC
HCT: 25.8 % — ABNORMAL LOW (ref 36.0–46.0)
HEMOGLOBIN: 8.2 g/dL — AB (ref 12.0–15.0)
MCH: 31.1 pg (ref 26.0–34.0)
MCHC: 31.8 g/dL (ref 30.0–36.0)
MCV: 97.7 fL (ref 78.0–100.0)
Platelets: 471 10*3/uL — ABNORMAL HIGH (ref 150–400)
RBC: 2.64 MIL/uL — AB (ref 3.87–5.11)
RDW: 15.5 % (ref 11.5–15.5)
WBC: 22.6 10*3/uL — AB (ref 4.0–10.5)

## 2014-12-14 LAB — BASIC METABOLIC PANEL
ANION GAP: 10 (ref 5–15)
BUN: 26 mg/dL — ABNORMAL HIGH (ref 6–20)
CHLORIDE: 110 mmol/L (ref 101–111)
CO2: 23 mmol/L (ref 22–32)
CREATININE: 0.93 mg/dL (ref 0.44–1.00)
Calcium: 8.8 mg/dL — ABNORMAL LOW (ref 8.9–10.3)
GFR calc non Af Amer: 59 mL/min — ABNORMAL LOW (ref >60)
Glucose, Bld: 203 mg/dL — ABNORMAL HIGH (ref 65–99)
POTASSIUM: 3 mmol/L — AB (ref 3.5–5.1)
SODIUM: 143 mmol/L (ref 135–145)

## 2014-12-14 LAB — BLOOD GAS, ARTERIAL
ACID-BASE DEFICIT: 0.2 mmol/L (ref 0.0–2.0)
Bicarbonate: 23.9 mEq/L (ref 20.0–24.0)
DRAWN BY: 441371
FIO2: 0.5
MECHVT: 500 mL
O2 Saturation: 94 %
PEEP/CPAP: 5 cmH2O
PO2 ART: 77.9 mmHg — AB (ref 80.0–100.0)
Patient temperature: 99.3
RATE: 24 resp/min
TCO2: 25.1 mmol/L (ref 0–100)
pCO2 arterial: 40.2 mmHg (ref 35.0–45.0)
pH, Arterial: 7.395 (ref 7.350–7.450)

## 2014-12-14 LAB — C DIFFICILE QUICK SCREEN W PCR REFLEX
C DIFFICILE (CDIFF) TOXIN: NEGATIVE
C DIFFICLE (CDIFF) ANTIGEN: POSITIVE — AB

## 2014-12-14 LAB — CULTURE, RESPIRATORY W GRAM STAIN

## 2014-12-14 LAB — GLUCOSE, CAPILLARY
GLUCOSE-CAPILLARY: 196 mg/dL — AB (ref 65–99)
GLUCOSE-CAPILLARY: 208 mg/dL — AB (ref 65–99)
GLUCOSE-CAPILLARY: 257 mg/dL — AB (ref 65–99)
GLUCOSE-CAPILLARY: 278 mg/dL — AB (ref 65–99)

## 2014-12-14 LAB — CULTURE, RESPIRATORY

## 2014-12-14 LAB — PROCALCITONIN: PROCALCITONIN: 6.4 ng/mL

## 2014-12-14 MED ORDER — FENTANYL CITRATE (PF) 100 MCG/2ML IJ SOLN: 50.0000 ug | INTRAMUSCULAR | Status: DC | PRN

## 2014-12-14 MED ORDER — POTASSIUM CHLORIDE 20 MEQ/15ML (10%) PO SOLN
40.0000 meq | Freq: Three times a day (TID) | ORAL | Status: DC
  Administered 2014-12-14 – 2014-12-15 (×6): 40 meq
  Filled 2014-12-14 (×9): qty 30

## 2014-12-14 NOTE — Progress Notes (Signed)
Inpatient Diabetes Program Recommendations  AACE/ADA: New Consensus Statement on Inpatient Glycemic Control (2013)  Target Ranges:  Prepandial:   less than 140 mg/dL      Peak postprandial:   less than 180 mg/dL (1-2 hours)      Critically ill patients:  140 - 180 mg/dL    Results for [name], [name] (MRN [id number]) as of 12/14/2014 07:30  Ref. Range 12/12/2014 23:08 12/13/2014 03:50 12/13/2014 08:18 12/13/2014 11:54 12/13/2014 16:30 12/13/2014 20:11  Glucose-Capillary Latest Ref Range: 65-99 mg/dL 169 (H) 223 (H) 262 (H) 183 (H) 179 (H) 296 (H)    Admit with: Pneumonia/ Sepsis  History: DM, Schizophrenia, Dementia, HTN  Home DM Meds: 70/30 insulin- 25 units bidwc  Lantus 15 units QHS  Actos 30 mg daily  Current DM Orders: Novolog Bedtime SSI scale (0-5 units- start at CBG of 201 mg/dl) Q4 hours      MD- Please consider the following in-hospital insulin adjustments:  1. Please consider discontinuing current Novolog 0-5 units Q4H correction scale and use ICU Glycemic Control order set to order Novolog correction Q4H to improve inpatient glycemic control  2. Start basal insulin at 0.1 units/kg dosing- Lantus 9 units daily     Will follow Wyn Quaker RN, MSN, CDE Diabetes Coordinator Inpatient Glycemic Control Team Team Pager: [phone] (8a-5p)

## 2014-12-14 NOTE — Progress Notes (Signed)
PULMONARY / CRITICAL CARE MEDICINE   Name: [name] MRN: [id number] DOB: 06/11/1940    ADMISSION DATE:  12/09/2014 CONSULTATION DATE:  8/20  REFERRING MD :  Evette Doffing   CHIEF COMPLAINT:  Hypoxic respiratory failure   INITIAL PRESENTATION:  74 year old female w sig h/o HTN.DM, schizophrenia. Admitted to IMTS on 8/19 w/ working dx of CAP. Developed progressive airspace disease on CXR and worsening hypoxia on 8/20. PCCM called and pt transferred to the ICU for emergent intubation.   STUDIES:  Head CT scan (8/20) No acute intracranial abnormality Echo (8/23) - Normal LV function; grade 1 diastolic dysfunction with elevated LV filling pressure; moderate MR; trace TR with mildly elevated pulmonary pressure.  SIGNIFICANT EVENTS: 8/20 intubated for respiratory failure. 8/23. Has issues with vent dysynchrony. Vent adjusted with increased rate. Still has poor urine output. Given albumin and fluid bolus.  SUBJECTIVE:  No issues overnight. Now diuresing well on lasix. Started on precedex. Fentanyl is coming off.  VITAL SIGNS: Temp:  [98.1 F (36.7 C)-99.5 F (37.5 C)] 98.8 F (37.1 C) (08/24 0600) Pulse Rate:  [61-101] 73 (08/24 0600) Resp:  [16-26] 24 (08/24 0600) BP: (101-171)/(42-139) 171/64 mmHg (08/24 0600) SpO2:  [92 %-100 %] 100 % (08/24 0600) FiO2 (%):  [50 %] 50 % (08/24 0600) Weight:  [193 lb 9 oz (87.8 kg)] 193 lb 9 oz (87.8 kg) (08/24 0358) HEMODYNAMICS:   VENTILATOR SETTINGS: Vent Mode:  [-] PRVC FiO2 (%):  [50 %] 50 % Set Rate:  [24 bmp] 24 bmp Vt Set:  [400 mL] 400 mL PEEP:  [8 cmH20] 8 cmH20 Plateau Pressure:  [20 cmH20-23 cmH20] 21 cmH20 INTAKE / OUTPUT:  Intake/Output Summary (Last 24 hours) at 12/14/14 0706 Last data filed at 12/14/14 0500  Gross per 24 hour  Intake 1478.35 ml  Output   2540 ml  Net -1061.65 ml    PHYSICAL EXAMINATION: General:  Sedated on vent HENT: NCAT, PERRL, ETT in place PULM: coarse crackles left, vent supported  breaths CV: RRR, no mgr GI: BS+, soft nontender MSK: normal bulk and tone Derm: Intact, no rash Neuro: sedated on vent, moves limbs spontaneously  LABS:  CBC  Recent Labs Lab 12/12/14 0251 12/13/14 0221 12/14/14 0234  WBC 25.4* 25.4* 22.6*  HGB 7.6* 8.0* 8.2*  HCT 24.8* 26.4* 25.8*  PLT 383 444* 471*   Coag's No results for input(s): APTT, INR in the last 168 hours. BMET  Recent Labs Lab 12/12/14 0251 12/13/14 0221 12/14/14 0234  NA 141 143 143  K 4.2 3.9 3.0*  CL 111 115* 110  CO2 21* 21* 23  BUN 23* 26* 26*  CREATININE 1.12* 1.12* 0.93  GLUCOSE 178* 171* 203*   Electrolytes  Recent Labs Lab 12/12/14 0251 12/13/14 0221 12/14/14 0234  CALCIUM 8.4* 8.8* 8.8*   Sepsis Markers  Recent Labs Lab 12/09/14 1704 12/09/14 1757 12/09/14 2054  12/12/14 0251 12/13/14 0951 12/14/14 0234  LATICACIDVEN 2.46* 3.56* 1.9  --   --   --   --   PROCALCITON  --   --   --   < > 22.95 9.08 6.40  < > = values in this interval not displayed. ABG  Recent Labs Lab 12/12/14 0348 12/13/14 0304 12/13/14 0422  PHART 7.342* 7.289* 7.372  PCO2ART 37.6 41.1 35.1  PO2ART 103* 80.0 74.8*   Liver Enzymes  Recent Labs Lab 12/09/14 1426 12/10/14 0226 12/12/14 1135  AST 29 29 76*  ALT 24 22 48  ALKPHOS 109 107  104  BILITOT 0.3 0.3 0.4  ALBUMIN 2.1* 1.9* 1.6*   Cardiac Enzymes No results for input(s): TROPONINI, PROBNP in the last 168 hours. Glucose  Recent Labs Lab 12/13/14 0818 12/13/14 1154 12/13/14 1630 12/13/14 2011 12/13/14 2336 12/14/14 0428  GLUCAP 262* 183* 179* 296* 231* 196*    Imaging 8/23 CXR personally reviewed> ETT in place, left lung consolidation noted, worsened opacities on left c/w pneumonia vs pulmonary edema. 8/24 CXR reviewed> personal read is that the left opacities persist. Right opacities are improved.   ASSESSMENT / PLAN: PULMONARY OETT  A: Severe CAP Acute hypoxic respiratory failure in setting of CAP P:   Full vent  support. ABG better after rate increase. WIll be able to decrease PEEP to 5.  VAP bundle Lasix 40 IV bid.  CARDIOVASCULAR CVL  A:  Severe sepsis without shock, lactic acid improved with IVF P:  Change IVF to Saline Continue Tele Will need central line for all the drips she is getting. Daughter refused the line earlier. We will readdress today.  RENAL A:   Mild increase in Renal function, likely related to sepsis P:   Monitor BMET and UOP Replace electrolytes (K) as needed.   GASTROINTESTINAL LFTs are normal. A:   No acute issues P:   Continue Tubefeeds  PPI for SUP   HEMATOLOGIC A:   Normocytic anemia without bleeding P:  Monitor for bleeding Transfusion threshold for Hgb < 7gm/dL  INFECTIOUS A:   Severe CAP Severe Sepsis  Ustrep >> neg  P:   BCx2 8/19>>> UC 8/19>>> u legionella 8/19>>> Sputum 8/19>>>  azithro 8/19>>> Rocephin 8/19>>> (increase dose to 2gm 8/21) vanc 8/20>>>  WBC count remains high today. All cultures remain negative. ICU Procalcitonin protocol. Procalcitonin remains high. Check C.diff as she is having diarrhea now.  ENDOCRINE A:   DM P:   Trend glucose  ssi   NEUROLOGIC A:   Acute encephalopathy Vent dyssynchrony P:   RASS goal: -1  Fentanyl gtt and prn. Precedex drip PRN versed Supportive care   FAMILY  - Updates: Updated son at bedside and daughter over the phone 8/22 - Inter-disciplinary family meet or Palliative Care meeting due by:  8/27  My CC time is 45 minutes.  Marshell Garfinkel MD [place] [facility] Pager [phone] If no answer or after 3pm call: [phone] 12/14/2014, 7:06 AM

## 2014-12-14 NOTE — Progress Notes (Signed)
Reduced peep to 5 per MD verbal order.

## 2014-12-15 ENCOUNTER — Inpatient Hospital Stay (HOSPITAL_COMMUNITY): Payer: Medicare Other

## 2014-12-15 LAB — POCT I-STAT 3, ART BLOOD GAS (G3+)
ACID-BASE EXCESS: 3 mmol/L — AB (ref 0.0–2.0)
Bicarbonate: 26.7 mEq/L — ABNORMAL HIGH (ref 20.0–24.0)
O2 Saturation: 91 %
PCO2 ART: 36.9 mmHg (ref 35.0–45.0)
PH ART: 7.469 — AB (ref 7.350–7.450)
PO2 ART: 58 mmHg — AB (ref 80.0–100.0)
Patient temperature: 99.5
TCO2: 28 mmol/L (ref 0–100)

## 2014-12-15 LAB — BASIC METABOLIC PANEL
ANION GAP: 9 (ref 5–15)
BUN: 25 mg/dL — ABNORMAL HIGH (ref 6–20)
CALCIUM: 8.9 mg/dL (ref 8.9–10.3)
CO2: 27 mmol/L (ref 22–32)
Chloride: 109 mmol/L (ref 101–111)
Creatinine, Ser: 0.85 mg/dL (ref 0.44–1.00)
Glucose, Bld: 264 mg/dL — ABNORMAL HIGH (ref 65–99)
POTASSIUM: 4.2 mmol/L (ref 3.5–5.1)
Sodium: 145 mmol/L (ref 135–145)

## 2014-12-15 LAB — CULTURE, BLOOD (ROUTINE X 2)
CULTURE: NO GROWTH
CULTURE: NO GROWTH

## 2014-12-15 LAB — PROCALCITONIN: PROCALCITONIN: 3.46 ng/mL

## 2014-12-15 LAB — CBC
HEMATOCRIT: 24.9 % — AB (ref 36.0–46.0)
HEMOGLOBIN: 7.9 g/dL — AB (ref 12.0–15.0)
MCH: 31 pg (ref 26.0–34.0)
MCHC: 31.7 g/dL (ref 30.0–36.0)
MCV: 97.6 fL (ref 78.0–100.0)
Platelets: 482 10*3/uL — ABNORMAL HIGH (ref 150–400)
RBC: 2.55 MIL/uL — AB (ref 3.87–5.11)
RDW: 15.5 % (ref 11.5–15.5)
WBC: 20.1 10*3/uL — AB (ref 4.0–10.5)

## 2014-12-15 LAB — GLUCOSE, CAPILLARY
GLUCOSE-CAPILLARY: 184 mg/dL — AB (ref 65–99)
GLUCOSE-CAPILLARY: 191 mg/dL — AB (ref 65–99)
GLUCOSE-CAPILLARY: 214 mg/dL — AB (ref 65–99)
GLUCOSE-CAPILLARY: 254 mg/dL — AB (ref 65–99)
GLUCOSE-CAPILLARY: 266 mg/dL — AB (ref 65–99)
Glucose-Capillary: 223 mg/dL — ABNORMAL HIGH (ref 65–99)

## 2014-12-15 LAB — CLOSTRIDIUM DIFFICILE BY PCR: CDIFFPCR: NEGATIVE

## 2014-12-15 MED ORDER — POTASSIUM CHLORIDE 20 MEQ/15ML (10%) PO SOLN: 40.0000 meq | Freq: Three times a day (TID) | ORAL | Status: DC

## 2014-12-15 MED ORDER — RAMIPRIL 10 MG PO CAPS: 10.0000 mg | ORAL_CAPSULE | Freq: Every day | ORAL | Status: DC

## 2014-12-15 MED ORDER — AMLODIPINE BESYLATE 10 MG PO TABS
10.0000 mg | ORAL_TABLET | Freq: Every day | ORAL | Status: DC
  Administered 2014-12-15 – 2014-12-18 (×4): 10 mg via ORAL
  Filled 2014-12-15 (×4): qty 1

## 2014-12-15 NOTE — Progress Notes (Signed)
Inpatient Diabetes Program Recommendations  AACE/ADA: New Consensus Statement on Inpatient Glycemic Control (2013)  Target Ranges:  Prepandial:   less than 140 mg/dL      Peak postprandial:   less than 180 mg/dL (1-2 hours)      Critically ill patients:  140 - 180 mg/dL    Results for [name], [name] (MRN [id number]) as of 12/15/2014 11:14  Ref. Range 12/13/2014 23:36 12/14/2014 04:28 12/14/2014 07:34 12/14/2014 11:30 12/14/2014 16:03  Glucose-Capillary Latest Ref Range: 65-99 mg/dL 231 (H) 196 (H) 208 (H) 257 (H) 278 (H)    Home DM Meds: 70/30 insulin- 25 units bidwc  Lantus 15 units QHS  Actos 30 mg daily  Current DM Orders: Novolog Bedtime SSI scale (0-5 units- start at CBG of 201 mg/dl) Q4 hours      MD- Please consider the following in-hospital insulin adjustments:  1. Please consider discontinuing current Novolog 0-5 units Q4H correction scale and use ICU Glycemic Control order set to order Novolog correction Q4H to improve inpatient glycemic control  2. Start basal insulin at 0.1 units/kg dosing- Lantus 9 units daily    Will follow Wyn Quaker RN, MSN, CDE Diabetes Coordinator Inpatient Glycemic Control Team Team Pager: [phone] (8a-5p)

## 2014-12-15 NOTE — Progress Notes (Addendum)
PULMONARY / CRITICAL CARE MEDICINE   Name: [name] MRN: [id number] DOB: 1940-12-24    ADMISSION DATE:  12/09/2014 CONSULTATION DATE:  8/20  REFERRING MD :  Evette Doffing   CHIEF COMPLAINT:  Hypoxic respiratory failure   INITIAL PRESENTATION:  74 year old female w sig h/o HTN.DM, schizophrenia. Admitted to IMTS on 8/19 w/ working dx of CAP. Developed progressive airspace disease on CXR and worsening hypoxia on 8/20. PCCM called and pt transferred to the ICU for emergent intubation.   STUDIES:  Head CT scan (8/20) No acute intracranial abnormality Echo (8/23) - Normal LV function; grade 1 diastolic dysfunction with elevated LV filling pressure; moderate MR; trace TR with mildly elevated pulmonary pressure.  SIGNIFICANT EVENTS: 8/20 intubated for respiratory failure. 8/23. Has issues with vent dysynchrony. Vent adjusted with increased rate. Still has poor urine output. Given albumin and fluid bolus.  SUBJECTIVE:  No issues overnight. Now diuresing well on lasix. Still on precedex.   VITAL SIGNS: Temp:  [98.4 F (36.9 C)-100.2 F (37.9 C)] 99.1 F (37.3 C) (08/25 0800) Pulse Rate:  [56-106] 87 (08/25 0800) Resp:  [20-34] 24 (08/25 0800) BP: (116-163)/(45-68) 149/47 mmHg (08/25 0800) SpO2:  [96 %-100 %] 96 % (08/25 0800) FiO2 (%):  [50 %] 50 % (08/25 0836) Weight:  [192 lb 10.9 oz (87.4 kg)] 192 lb 10.9 oz (87.4 kg) (08/25 0359) HEMODYNAMICS:   VENTILATOR SETTINGS: Vent Mode:  [-] PSV;CPAP FiO2 (%):  [50 %] 50 % Set Rate:  [24 bmp] 24 bmp Vt Set:  [400 mL] 400 mL PEEP:  [5 cmH20] 5 cmH20 Pressure Support:  [12 cmH20] 12 cmH20 Plateau Pressure:  [17 cmH20-22 cmH20] 17 cmH20 INTAKE / OUTPUT:  Intake/Output Summary (Last 24 hours) at 12/15/14 0934 Last data filed at 12/15/14 0800  Gross per 24 hour  Intake 1952.69 ml  Output   2620 ml  Net -667.31 ml    PHYSICAL EXAMINATION: General:  Sedated on vent HENT: NCAT, PERRL, ETT in place PULM: coarse crackles  left, vent supported breaths CV: RRR, no mgr GI: BS+, soft nontender MSK: normal bulk and tone Derm: Intact, no rash Neuro: sedated on vent, moves limbs spontaneously  LABS:  CBC  Recent Labs Lab 12/12/14 0251 12/13/14 0221 12/14/14 0234  WBC 25.4* 25.4* 22.6*  HGB 7.6* 8.0* 8.2*  HCT 24.8* 26.4* 25.8*  PLT 383 444* 471*   Coag's No results for input(s): APTT, INR in the last 168 hours. BMET  Recent Labs Lab 12/12/14 0251 12/13/14 0221 12/14/14 0234  NA 141 143 143  K 4.2 3.9 3.0*  CL 111 115* 110  CO2 21* 21* 23  BUN 23* 26* 26*  CREATININE 1.12* 1.12* 0.93  GLUCOSE 178* 171* 203*   Electrolytes  Recent Labs Lab 12/12/14 0251 12/13/14 0221 12/14/14 0234  CALCIUM 8.4* 8.8* 8.8*   Sepsis Markers  Recent Labs Lab 12/09/14 1704 12/09/14 1757 12/09/14 2054  12/13/14 0951 12/14/14 0234 12/15/14 0330  LATICACIDVEN 2.46* 3.56* 1.9  --   --   --   --   PROCALCITON  --   --   --   < > 9.08 6.40 3.46  < > = values in this interval not displayed. ABG  Recent Labs Lab 12/13/14 0304 12/13/14 0422 12/14/14 1220  PHART 7.289* 7.372 7.395  PCO2ART 41.1 35.1 40.2  PO2ART 80.0 74.8* 77.9*   Liver Enzymes  Recent Labs Lab 12/09/14 1426 12/10/14 0226 12/12/14 1135  AST 29 29 76*  ALT 24 22 48  ALKPHOS 109 107 104  BILITOT 0.3 0.3 0.4  ALBUMIN 2.1* 1.9* 1.6*   Cardiac Enzymes No results for input(s): TROPONINI, PROBNP in the last 168 hours. Glucose  Recent Labs Lab 12/13/14 2336 12/14/14 0428 12/14/14 0734 12/14/14 1130 12/14/14 1603 12/15/14 0421  GLUCAP 231* 196* 208* 257* 278* 191*    Imaging 8/23 CXR personally reviewed> ETT in place, left lung consolidation noted, worsened opacities on left c/w pneumonia vs pulmonary edema. 8/24 CXR reviewed> personal read is that the left opacities persist. Right opacities are improved.   ASSESSMENT / PLAN: PULMONARY OETT  A: Severe CAP Acute hypoxic respiratory failure in setting of  CAP P:   Full vent support. PEEP reduced to 5  VAP bundle Lasix 40 IV bid. Daily SBTs  CARDIOVASCULAR CVL  A:  Severe sepsis without shock, lactic acid improved with IVF P:  Change IVF to Saline Continue Tele Will need central line for all the drips she is getting.  Restart norvasc  RENAL A:   Mild increase in Renal function, likely related to sepsis P:   Monitor BMET and UOP Replace electrolytes (K) as needed.   GASTROINTESTINAL LFTs are normal. A:   No acute issues P:   Continue Tubefeeds  PPI for SUP   HEMATOLOGIC A:   Normocytic anemia without bleeding P:  Monitor for bleeding Transfusion threshold for Hgb < 7gm/dL  INFECTIOUS A:   Severe CAP Severe Sepsis  Ustrep >> neg  P:   BCx2 8/19>>> UC 8/19>>> u legionella 8/19>>> Sputum 8/19>>>  azithro 8/19>>> Rocephin 8/19>>> (increase dose to 2gm 8/21) vanc 8/20>>>  WBC count remains high today. All cultures remain negative. ICU Procalcitonin protocol. Procalcitonin is improving. D/C vanco as MRSA is negative. C.Diff test indicates colonization.  ENDOCRINE A:   DM P:   Trend glucose  ssi   NEUROLOGIC A:   Acute encephalopathy Vent dyssynchrony P:   RASS goal: -1  Fentanyl gtt and prn. Precedex drip PRN versed Supportive care   FAMILY  - Updates: Updated daughter over the phone 8/25 - Inter-disciplinary family meet or Palliative Care meeting due by:  8/27  My CC time is 45 minutes. This separate from other procedure done in the same day.  Marshell Garfinkel MD [place] [facility] Pager [phone] If no answer or after 3pm call: [phone] 12/15/2014, 9:34 AM

## 2014-12-15 NOTE — Progress Notes (Signed)
RT increased FIO2 back to 50% per MD verbal order after ABG gas results: PH 7.477  PCO2 36.1 PO2 56 HCO3 36.9

## 2014-12-15 NOTE — Progress Notes (Signed)
RT terminated wean due to increased RR > 45 and patient agitation.

## 2014-12-15 NOTE — Progress Notes (Signed)
Reduced FIO2 to 40% per MD order.

## 2014-12-15 NOTE — Procedures (Signed)
Central Venous Catheter Insertion Procedure Note [name] [id number] [birth year]  Procedure: Insertion of Central Venous Catheter Indications: Drug and/or fluid administration and Frequent blood sampling  Procedure Details Consent: Risks of procedure as well as the alternatives and risks of each were explained to the (patient/caregiver).  Consent for procedure obtained. Time Out: Verified patient identification, verified procedure, site/side was marked, verified correct patient position, special equipment/implants available, medications/allergies/relevent history reviewed, required imaging and test results available.  Performed  Maximum sterile technique was used including antiseptics, cap, gloves, gown, hand hygiene, mask and sheet. Skin prep: Chlorhexidine; local anesthetic administered A antimicrobial bonded/coated triple lumen catheter was placed in the right internal jugular vein using the Seldinger technique.  Evaluation Blood flow good Complications: No apparent complications Patient did tolerate procedure well. Chest X-ray ordered to verify placement.  CXR: pending.  [name] 12/15/2014, 12:31 PM

## 2014-12-16 ENCOUNTER — Inpatient Hospital Stay (HOSPITAL_COMMUNITY): Payer: Medicare Other

## 2014-12-16 LAB — BASIC METABOLIC PANEL
Anion gap: 8 (ref 5–15)
BUN: 31 mg/dL — AB (ref 6–20)
CALCIUM: 9 mg/dL (ref 8.9–10.3)
CHLORIDE: 108 mmol/L (ref 101–111)
CO2: 28 mmol/L (ref 22–32)
CREATININE: 0.86 mg/dL (ref 0.44–1.00)
GFR calc non Af Amer: >60 mL/min (ref >60)
Glucose, Bld: 302 mg/dL — ABNORMAL HIGH (ref 65–99)
Potassium: 4.5 mmol/L (ref 3.5–5.1)
SODIUM: 144 mmol/L (ref 135–145)

## 2014-12-16 LAB — GLUCOSE, CAPILLARY
GLUCOSE-CAPILLARY: 258 mg/dL — AB (ref 65–99)
GLUCOSE-CAPILLARY: 265 mg/dL — AB (ref 65–99)
GLUCOSE-CAPILLARY: 288 mg/dL — AB (ref 65–99)
Glucose-Capillary: 253 mg/dL — ABNORMAL HIGH (ref 65–99)
Glucose-Capillary: 302 mg/dL — ABNORMAL HIGH (ref 65–99)
Glucose-Capillary: 278 mg/dL — ABNORMAL HIGH (ref 65–99)
Glucose-Capillary: 196 mg/dL — ABNORMAL HIGH (ref 65–99)

## 2014-12-16 LAB — CBC
HCT: 24.2 % — ABNORMAL LOW (ref 36.0–46.0)
HEMOGLOBIN: 7.5 g/dL — AB (ref 12.0–15.0)
MCH: 30.4 pg (ref 26.0–34.0)
MCHC: 31 g/dL (ref 30.0–36.0)
MCV: 98 fL (ref 78.0–100.0)
Platelets: 486 10*3/uL — ABNORMAL HIGH (ref 150–400)
RBC: 2.47 MIL/uL — ABNORMAL LOW (ref 3.87–5.11)
RDW: 15.3 % (ref 11.5–15.5)
WBC: 19.4 10*3/uL — ABNORMAL HIGH (ref 4.0–10.5)

## 2014-12-16 LAB — PROCALCITONIN: Procalcitonin: 1.37 ng/mL

## 2014-12-16 MED ORDER — POTASSIUM CHLORIDE 20 MEQ/15ML (10%) PO SOLN
20.0000 meq | Freq: Two times a day (BID) | ORAL | Status: DC
  Administered 2014-12-16 – 2014-12-19 (×8): 20 meq
  Filled 2014-12-16 (×8): qty 15

## 2014-12-16 MED ORDER — INSULIN GLARGINE 100 UNIT/ML ~~LOC~~ SOLN
10.0000 [IU] | Freq: Every day | SUBCUTANEOUS | Status: DC
  Administered 2014-12-16 – 2014-12-17 (×2): 10 [IU] via SUBCUTANEOUS
  Filled 2014-12-16 (×3): qty 0.1

## 2014-12-16 MED ORDER — RAMIPRIL 10 MG PO CAPS
10.0000 mg | ORAL_CAPSULE | Freq: Every day | ORAL | Status: DC
  Administered 2014-12-16 – 2014-12-18 (×3): 10 mg via ORAL
  Filled 2014-12-16 (×3): qty 1

## 2014-12-16 MED ORDER — INSULIN ASPART 100 UNIT/ML ~~LOC~~ SOLN
0.0000 [IU] | SUBCUTANEOUS | Status: DC
  Administered 2014-12-16: 5 [IU] via SUBCUTANEOUS
  Administered 2014-12-16: 8 [IU] via SUBCUTANEOUS
  Administered 2014-12-16: 3 [IU] via SUBCUTANEOUS
  Administered 2014-12-16: 8 [IU] via SUBCUTANEOUS
  Administered 2014-12-17: 5 [IU] via SUBCUTANEOUS
  Administered 2014-12-17: 3 [IU] via SUBCUTANEOUS
  Administered 2014-12-17 (×2): 5 [IU] via SUBCUTANEOUS
  Administered 2014-12-17: 3 [IU] via SUBCUTANEOUS
  Administered 2014-12-17: 8 [IU] via SUBCUTANEOUS
  Administered 2014-12-18: 3 [IU] via SUBCUTANEOUS
  Administered 2014-12-18: 8 [IU] via SUBCUTANEOUS
  Administered 2014-12-18: 3 [IU] via SUBCUTANEOUS
  Administered 2014-12-18 – 2014-12-19 (×4): 5 [IU] via SUBCUTANEOUS
  Administered 2014-12-19 – 2014-12-20 (×6): 3 [IU] via SUBCUTANEOUS
  Administered 2014-12-20 (×2): 2 [IU] via SUBCUTANEOUS
  Administered 2014-12-20 – 2014-12-21 (×3): 3 [IU] via SUBCUTANEOUS
  Administered 2014-12-21: 2 [IU] via SUBCUTANEOUS
  Administered 2014-12-22 (×2): 3 [IU] via SUBCUTANEOUS
  Administered 2014-12-22 (×3): 2 [IU] via SUBCUTANEOUS
  Administered 2014-12-23: 3 [IU] via SUBCUTANEOUS
  Administered 2014-12-23: 8 [IU] via SUBCUTANEOUS
  Administered 2014-12-23: 3 [IU] via SUBCUTANEOUS
  Administered 2014-12-23: 5 [IU] via SUBCUTANEOUS
  Administered 2014-12-23: 2 [IU] via SUBCUTANEOUS
  Administered 2014-12-23 – 2014-12-24 (×2): 3 [IU] via SUBCUTANEOUS
  Administered 2014-12-24: 5 [IU] via SUBCUTANEOUS
  Administered 2014-12-24: 3 [IU] via SUBCUTANEOUS
  Administered 2014-12-24: 5 [IU] via SUBCUTANEOUS
  Administered 2014-12-24: 2 [IU] via SUBCUTANEOUS
  Administered 2014-12-25: 5 [IU] via SUBCUTANEOUS
  Administered 2014-12-25: 8 [IU] via SUBCUTANEOUS
  Administered 2014-12-25: 3 [IU] via SUBCUTANEOUS

## 2014-12-16 NOTE — Progress Notes (Addendum)
PULMONARY / CRITICAL CARE MEDICINE   Name: [name] MRN: [id number] DOB: Apr 02, 1941    ADMISSION DATE:  12/09/2014 CONSULTATION DATE:  8/20  REFERRING MD :  Evette Doffing   CHIEF COMPLAINT:  Hypoxic respiratory failure   INITIAL PRESENTATION:  74 year old female w sig h/o HTN, DM, schizophrenia. Admitted to IMTS on 8/19 w/ working dx of CAP. Developed progressive airspace disease on CXR and worsening hypoxia on 8/20. PCCM called and pt transferred to the ICU for emergent intubation.   STUDIES:  Head CT scan (8/20) No acute intracranial abnormality Echo (8/23) - Normal LV function; grade 1 diastolic dysfunction with elevated LV filling pressure; moderate MR; trace TR with mildly elevated pulmonary pressure.  SIGNIFICANT EVENTS: 8/20 intubated for respiratory failure. 8/23. Has issues with vent dysynchrony. Vent adjusted with increased rate. Still has poor urine output. Given albumin and fluid bolus. 8/25. Central line placed for access.  SUBJECTIVE:  No issues overnight. Now diuresing well on lasix. Still on precedex. Not tolerating PSV trials   VITAL SIGNS: Temp:  [99.1 F (37.3 C)-100.2 F (37.9 C)] 99.3 F (37.4 C) (08/26 0700) Pulse Rate:  [59-96] 77 (08/26 0829) Resp:  [21-33] 33 (08/26 0829) BP: (127-183)/(47-75) 158/62 mmHg (08/26 0829) SpO2:  [94 %-100 %] 98 % (08/26 0829) FiO2 (%):  [40 %-50 %] 40 % (08/26 0829) Weight:  [184 lb 15.5 oz (83.9 kg)] 184 lb 15.5 oz (83.9 kg) (08/26 0338) HEMODYNAMICS:   VENTILATOR SETTINGS: Vent Mode:  [-] PSV;CPAP FiO2 (%):  [40 %-50 %] 40 % Set Rate:  [24 bmp] 24 bmp Vt Set:  [400 mL] 400 mL PEEP:  [5 cmH20] 5 cmH20 Pressure Support:  [12 cmH20-15 cmH20] 15 cmH20 Plateau Pressure:  [11 cmH20-21 cmH20] 16 cmH20 INTAKE / OUTPUT:  Intake/Output Summary (Last 24 hours) at 12/16/14 0834 Last data filed at 12/16/14 0700  Gross per 24 hour  Intake 1696.47 ml  Output   3200 ml  Net -1503.53 ml    PHYSICAL  EXAMINATION: General:  Sedated on vent HENT: NCAT, PERRL, ETT in place PULM: coarse crackles left, vent supported breaths CV: RRR, no mgr, 2+ edema. GI: BS+, soft nontender MSK: normal bulk and tone Derm: Intact, no rash Neuro: sedated on vent, moves limbs spontaneously  LABS:  CBC  Recent Labs Lab 12/14/14 0234 12/15/14 1300 12/16/14 0630  WBC 22.6* 20.1* 19.4*  HGB 8.2* 7.9* 7.5*  HCT 25.8* 24.9* 24.2*  PLT 471* 482* 486*   Coag's No results for input(s): APTT, INR in the last 168 hours. BMET  Recent Labs Lab 12/14/14 0234 12/15/14 1300 12/16/14 0630  NA 143 145 144  K 3.0* 4.2 4.5  CL 110 109 108  CO2 23 27 28   BUN 26* 25* 31*  CREATININE 0.93 0.85 0.86  GLUCOSE 203* 264* 302*   Electrolytes  Recent Labs Lab 12/14/14 0234 12/15/14 1300 12/16/14 0630  CALCIUM 8.8* 8.9 9.0   Sepsis Markers  Recent Labs Lab 12/09/14 1704 12/09/14 1757 12/09/14 2054  12/13/14 0951 12/14/14 0234 12/15/14 0330  LATICACIDVEN 2.46* 3.56* 1.9  --   --   --   --   PROCALCITON  --   --   --   < > 9.08 6.40 3.46  < > = values in this interval not displayed. ABG  Recent Labs Lab 12/13/14 0422 12/14/14 1220 12/15/14 1242  PHART 7.372 7.395 7.469*  PCO2ART 35.1 40.2 36.9  PO2ART 74.8* 77.9* 58.0*   Liver Enzymes  Recent Labs Lab  12/09/14 1426 12/10/14 0226 12/12/14 1135  AST 29 29 76*  ALT 24 22 48  ALKPHOS 109 107 104  BILITOT 0.3 0.3 0.4  ALBUMIN 2.1* 1.9* 1.6*   Cardiac Enzymes No results for input(s): TROPONINI, PROBNP in the last 168 hours. Glucose  Recent Labs Lab 12/14/14 2109 12/14/14 2332 12/15/14 0421 12/15/14 0820 12/15/14 1137 12/15/14 1531  GLUCAP 254* 214* 191* 184* 223* 266*    Imaging 8/26 CXR personally reviewed> ETT, right IJ in place, left lung consolidation, CHF.  ASSESSMENT / PLAN: PULMONARY OETT  A: Severe CAP Acute hypoxic respiratory failure in setting of CAP Only tolerating PSV support of 12/5 P:   Full vent  support on minimal settings VAP bundle Lasix 40 IV bid. Goal net negative 1 lt daily. Monitor and replete K while on diuresis Daily SBTs. She still is volume overloaded and will need more diuresis.   CARDIOVASCULAR CVL  A:  Severe sepsis without shock, lactic acid improved with IVF P:  Continue Tele Restart home HTN meds- Norvasc and Altace  RENAL A:   Mild increase in Renal function, likely related to sepsis P:   Monitor BMET and UOP Replace electrolytes (K) as needed.  GASTROINTESTINAL LFTs are normal. A:   No acute issues P:   Continue Tubefeeds  PPI for SUP   HEMATOLOGIC A:   Normocytic anemia without bleeding P:  Monitor for bleeding Transfusion threshold for Hgb < 7gm/dL  INFECTIOUS A:   Severe CAP Severe Sepsis  Ustrep >> neg  P:   BCx2 8/19>>> UC 8/19>>> u legionella 8/19>>> Sputum 8/19>>>  azithro 8/19>>> Rocephin 8/19>>> (increase dose to 2gm 8/21) vanc 8/20>>> 8/25  WBC counts are improving. All cultures remain negative. C.Diff test indicates colonization.  ENDOCRINE A:   DM P:   Glucose is trending upwards Restart Lantus at 10 units Change SSI to moderate dose.   NEUROLOGIC A:   Acute encephalopathy Vent dyssynchrony P:   RASS goal: -1  Fentanyl gtt and prn. Precedex drip PRN versed Supportive care   FAMILY  - Updates: Updated daughter over the phone 8/25 - Inter-disciplinary family meet or Palliative Care meeting due by:  8/27  My CC time is 45 minutes.  Marshell Garfinkel MD [facility] Pager [phone] If no answer or after 3pm call: [phone] 12/16/2014, 8:34 AM

## 2014-12-16 NOTE — Care Management Important Message (Signed)
Important Message  Patient Details  Name: [name] MRN: [id number] Date of Birth: 06-Jul-1940   Medicare Important Message Given:  Yes-third notification given    Ella Bodo, RN 12/16/2014, 5:16 PM

## 2014-12-16 NOTE — Care Management Note (Signed)
Case Management Note  Patient Details  Name: [name] MRN: [id number] Date of Birth: [birth year]  Subjective/Objective:    Pt admitted on 12/09/14 with CAP with respiratory failure.  PTA, pt resided in an apartment alone.  She has supportive family, at bedside.                  Action/Plan: Pt remains sedated and on a ventilator.  Will continue to follow progress.  Will need PT/OT consults when medically appropriate.    Expected Discharge Date:                  Expected Discharge Plan:  Skilled Nursing Facility  In-House Referral:  Clinical Social Work  Discharge planning Services  CM Consult  Post Acute Care Choice:    Choice offered to:     DME Arranged:    DME Agency:     HH Arranged:    [place] Agency:     Status of Service:  In process, will continue to follow  Medicare Important Message Given:  Yes-second notification given Date Medicare IM Given:    Medicare IM give by:    Date Additional Medicare IM Given:    Additional Medicare Important Message give by:     If discussed at [place] of Stay Meetings, dates discussed:    Additional Comments:  Reinaldo Raddle, RN, BSN  Trauma/Neuro ICU Case Manager [phone]

## 2014-12-17 DIAGNOSIS — J9601 Acute respiratory failure with hypoxia: Secondary | ICD-10-CM | POA: Insufficient documentation

## 2014-12-17 LAB — GLUCOSE, CAPILLARY
GLUCOSE-CAPILLARY: 220 mg/dL — AB (ref 65–99)
GLUCOSE-CAPILLARY: 190 mg/dL — AB (ref 65–99)
GLUCOSE-CAPILLARY: 203 mg/dL — AB (ref 65–99)
Glucose-Capillary: 222 mg/dL — ABNORMAL HIGH (ref 65–99)
Glucose-Capillary: 198 mg/dL — ABNORMAL HIGH (ref 65–99)

## 2014-12-17 LAB — CULTURE, BLOOD (ROUTINE X 2)
CULTURE: NO GROWTH
CULTURE: NO GROWTH

## 2014-12-17 LAB — CBC
HCT: 23.8 % — ABNORMAL LOW (ref 36.0–46.0)
HEMOGLOBIN: 7.4 g/dL — AB (ref 12.0–15.0)
MCH: 30.3 pg (ref 26.0–34.0)
MCHC: 31.1 g/dL (ref 30.0–36.0)
MCV: 97.5 fL (ref 78.0–100.0)
PLATELETS: 476 10*3/uL — AB (ref 150–400)
RBC: 2.44 MIL/uL — AB (ref 3.87–5.11)
RDW: 15.4 % (ref 11.5–15.5)
WBC: 17.4 10*3/uL — ABNORMAL HIGH (ref 4.0–10.5)

## 2014-12-17 LAB — BASIC METABOLIC PANEL
Anion gap: 7 (ref 5–15)
BUN: 36 mg/dL — AB (ref 6–20)
CHLORIDE: 107 mmol/L (ref 101–111)
CO2: 30 mmol/L (ref 22–32)
CREATININE: 0.79 mg/dL (ref 0.44–1.00)
Calcium: 9.3 mg/dL (ref 8.9–10.3)
Glucose, Bld: 216 mg/dL — ABNORMAL HIGH (ref 65–99)
Potassium: 4 mmol/L (ref 3.5–5.1)
SODIUM: 144 mmol/L (ref 135–145)

## 2014-12-17 MED ORDER — POTASSIUM CHLORIDE 10 MEQ/50ML IV SOLN
INTRAVENOUS | Status: AC
  Administered 2014-12-17: 10 meq via INTRAVENOUS
  Filled 2014-12-17: qty 50

## 2014-12-17 MED ORDER — POTASSIUM CHLORIDE 10 MEQ/50ML IV SOLN
10.0000 meq | INTRAVENOUS | Status: AC
  Administered 2014-12-17 (×2): 10 meq via INTRAVENOUS
  Filled 2014-12-17: qty 50

## 2014-12-17 MED ORDER — POTASSIUM CHLORIDE 10 MEQ/100ML IV SOLN
10.0000 meq | INTRAVENOUS | Status: DC
  Administered 2014-12-17 (×2): 10 meq via INTRAVENOUS
  Filled 2014-12-17: qty 100

## 2014-12-17 MED ORDER — WHITE PETROLATUM GEL
Status: AC
  Administered 2014-12-17: 0.2
  Filled 2014-12-17: qty 1

## 2014-12-17 NOTE — Progress Notes (Signed)
PULMONARY / CRITICAL CARE MEDICINE   Name: [name] MRN: [id number] DOB: [birth year]    ADMISSION DATE:  12/09/2014 CONSULTATION DATE:  8/20  REFERRING MD :  Evette Doffing   CHIEF COMPLAINT:  Hypoxic respiratory failure   INITIAL PRESENTATION:  74 year old female w sig h/o HTN, DM, schizophrenia. Admitted to IMTS on 8/19 w/ working dx of CAP. Developed progressive airspace disease on CXR and worsening hypoxia on 8/20. PCCM called and pt transferred to the ICU for emergent intubation.   STUDIES:  Head CT scan (8/20) No acute intracranial abnormality Echo (8/23) - Normal LV function; grade 1 diastolic dysfunction with elevated LV filling pressure; moderate MR; trace TR with mildly elevated pulmonary pressure.  SIGNIFICANT EVENTS: 8/20 intubated for respiratory failure. 8/23. Has issues with vent dysynchrony. Vent adjusted with increased rate. Still has poor urine output. Given albumin and fluid bolus. 8/25. Central line placed for access.  SUBJECTIVE:  No issues overnight. Now diuresing well on lasix. Still on precedex. Weaning poorly   VITAL SIGNS: Temp:  [99 F (37.2 C)-99.9 F (37.7 C)] 99 F (37.2 C) (08/27 0800) Pulse Rate:  [52-108] 59 (08/27 0800) Resp:  [15-34] 15 (08/27 0800) BP: (121-161)/(45-63) 142/59 mmHg (08/27 0800) SpO2:  [95 %-100 %] 100 % (08/27 0800) FiO2 (%):  [40 %] 40 % (08/27 0800) Weight:  [183 lb 6.8 oz (83.2 kg)] 183 lb 6.8 oz (83.2 kg) (08/27 0300) HEMODYNAMICS:   VENTILATOR SETTINGS: Vent Mode:  [-] PSV;CPAP FiO2 (%):  [40 %] 40 % Set Rate:  [24 bmp] 24 bmp Vt Set:  [400 mL] 400 mL PEEP:  [5 cmH20] 5 cmH20 Pressure Support:  [15 cmH20] 15 cmH20 Plateau Pressure:  [16 cmH20-22 cmH20] 22 cmH20 INTAKE / OUTPUT:  Intake/Output Summary (Last 24 hours) at 12/17/14 0938 Last data filed at 12/17/14 0800  Gross per 24 hour  Intake 1005.9 ml  Output   2450 ml  Net -1444.1 ml    PHYSICAL EXAMINATION: General:  Sedated on vent HENT:  NCAT, PERRL, ETT in place PULM: coarse crackles bases CV: RRR, no mgr, 2+ edema. GI: BS+, soft nontender MSK: normal bulk and tone Derm: Intact, no rash Neuro: sedated on vent, moves limbs spontaneously,open eyes  LABS:  CBC  Recent Labs Lab 12/15/14 1300 12/16/14 0630 12/17/14 0350  WBC 20.1* 19.4* 17.4*  HGB 7.9* 7.5* 7.4*  HCT 24.9* 24.2* 23.8*  PLT 482* 486* 476*   Coag's No results for input(s): APTT, INR in the last 168 hours. BMET  Recent Labs Lab 12/15/14 1300 12/16/14 0630 12/17/14 0350  NA 145 144 144  K 4.2 4.5 4.0  CL 109 108 107  CO2 27 28 30   BUN 25* 31* 36*  CREATININE 0.85 0.86 0.79  GLUCOSE 264* 302* 216*   Electrolytes  Recent Labs Lab 12/15/14 1300 12/16/14 0630 12/17/14 0350  CALCIUM 8.9 9.0 9.3   Sepsis Markers  Recent Labs Lab 12/14/14 0234 12/15/14 0330 12/16/14 1636  PROCALCITON 6.40 3.46 1.37   ABG  Recent Labs Lab 12/13/14 0422 12/14/14 1220 12/15/14 1242  PHART 7.372 7.395 7.469*  PCO2ART 35.1 40.2 36.9  PO2ART 74.8* 77.9* 58.0*   Liver Enzymes  Recent Labs Lab 12/12/14 1135  AST 76*  ALT 48  ALKPHOS 104  BILITOT 0.4  ALBUMIN 1.6*   Cardiac Enzymes No results for input(s): TROPONINI, PROBNP in the last 168 hours. Glucose  Recent Labs Lab 12/16/14 0739 12/16/14 1105 12/16/14 1536 12/16/14 1945 12/16/14 2328 12/17/14 0735  GLUCAP  288* 278* 196* 265* 220* 203*    Imaging Dg Chest Port 1 View  12/16/2014   CLINICAL DATA:  Respiratory failure.  EXAM: PORTABLE CHEST - 1 VIEW  COMPARISON:  Chest x-ray dated 12/15/2014.  FINDINGS: Endotracheal tube remains well positioned with tip just above the level of the carina. Right IJ central line remains well positioned with tip in the superior vena cava near the cavoatrial junction. Enteric tube remains adequately positioned in the stomach with tip below the lower limits of this exam.  Cardiomegaly appears grossly stable. Again noted is central pulmonary  vascular congestion and bilateral airspace opacities most suggestive of pulmonary edema pattern, left greater than right, unchanged. An additional dense opacity at the left lung base is probably atelectasis. Suspect small left pleural effusion. No new lung findings. No pneumothorax.  IMPRESSION: 1. Persistent central pulmonary vascular congestion and bilateral pulmonary edema pattern, left greater than right, without significant interval change.  2. Additional dense opacity at the left lung base is also unchanged and could represent compressive atelectasis or pneumonia, as also suggested on the previous chest x-ray report.  3. Probable small left pleural effusion, unchanged.  4. Cardiomegaly, stable.  5. Tubes and lines are stable.  6. No new findings.   Electronically Signed   By: Franki Cabot M.D.   On: 12/16/2014 07:18   Dg Chest Port 1 View  12/15/2014   CLINICAL DATA:  Encounter for central line placement.  EXAM: PORTABLE CHEST - 1 VIEW  COMPARISON:  December 14, 2014.  FINDINGS: Stable cardiomegaly. Endotracheal and nasogastric tubes are in grossly good position. Interval placement of right internal jugular catheter line with distal tip at the expected position of the cavoatrial junction. Stable central pulmonary vascular congestion is noted. No pneumothorax is noted. Left basilar opacity is noted concerning for pneumonia or atelectasis with associated pleural effusion. Probable mild bilateral perihilar edema is noted. Bony thorax is intact.  IMPRESSION: Interval placement of right internal jugular catheter with distal tip in expected position of cavoatrial junction. No pneumothorax is noted. Stable central pulmonary vascular congestion and bilateral perihilar edema is noted. Left basilar pneumonia or subsegmental atelectasis is noted with probable associated pleural effusion.   Electronically Signed   By: Marijo Conception, M.D.   On: 12/15/2014 13:13     ASSESSMENT / PLAN: PULMONARY OETT  A: Severe  CAP Acute hypoxic respiratory failure in setting of CAP Only tolerating PSV support of 12/5 P:   Full vent support on minimal settings VAP bundle Lasix 40 IV bid. Goal net negative 1 lt daily. Monitor and replete K while on diuresis Daily SBTs. She still is volume overloaded and will continue  diuresis.   Intake/Output Summary (Last 24 hours) at 12/17/14 0950 Last data filed at 12/17/14 0900  Gross per 24 hour  Intake 1138.8 ml  Output   2450 ml  Net -1311.2 ml    CARDIOVASCULAR CVL  A:  Severe sepsis without shock, lactic acid improved with IVF P:  Continue Tele Restart home HTN meds- Norvasc and Altace  RENAL Lab Results  Component Value Date   CREATININE 0.79 12/17/2014   CREATININE 0.86 12/16/2014   CREATININE 0.85 12/15/2014    A:   Mild increase in Renal function, likely related to sepsis P:   Monitor BMET and UOP Replace electrolytes (K) as needed.  GASTROINTESTINAL LFTs are normal. A:   No acute issues P:   Continue Tubefeeds  PPI for SUP   HEMATOLOGIC  Recent Labs  12/16/14 0630 12/17/14 0350  HGB 7.5* 7.4*    A:   Normocytic anemia without bleeding P:  Monitor for bleeding Transfusion threshold for Hgb < 7gm/dL  INFECTIOUS A:   Severe CAP Severe Sepsis  Ustrep >> neg  P:   BCx2 8/19>>> UC 8/19>>>neg u legionella 8/19>>> Sputum 8/19>>>neg  azithro 8/19>>> Rocephin 8/19>>> (increase dose to 2gm 8/21)>> 8/27 vanc 8/20>>> 8/25  WBC counts are improving. All cultures remain negative. C.Diff test indicates colonization.  ENDOCRINE CBG (last 3)   Recent Labs  12/16/14 1945 12/16/14 2328 12/17/14 0735  GLUCAP 265* 220* 203*     A:   DM P:   Glucose is trending downwards with lantus addition Change SSI to moderate dose.   NEUROLOGIC A:   Acute encephalopathy Vent dyssynchrony P:   RASS goal: -1  Fentanyl gtt and prn. Precedex drip PRN versed Supportive care   FAMILY  - Updates: Updated daughter over the  phone 8/25 - Inter-disciplinary family meet or Palliative Care meeting due by:  8/27  Richardson Landry Anquinette Pierro ACNP [facility] PCCM Pager [phone] till 3 pm If no answer page [phone] 12/17/2014, 9:38 AM

## 2014-12-18 ENCOUNTER — Inpatient Hospital Stay (HOSPITAL_COMMUNITY): Payer: Medicare Other

## 2014-12-18 LAB — POCT I-STAT 3, ART BLOOD GAS (G3+)
ACID-BASE EXCESS: 6 mmol/L — AB (ref 0.0–2.0)
Bicarbonate: 30.2 mEq/L — ABNORMAL HIGH (ref 20.0–24.0)
O2 Saturation: 95 %
PH ART: 7.462 — AB (ref 7.350–7.450)
TCO2: 31 mmol/L (ref 0–100)
pCO2 arterial: 42.2 mmHg (ref 35.0–45.0)
pO2, Arterial: 74 mmHg — ABNORMAL LOW (ref 80.0–100.0)

## 2014-12-18 LAB — BASIC METABOLIC PANEL
ANION GAP: 9 (ref 5–15)
BUN: 43 mg/dL — ABNORMAL HIGH (ref 6–20)
CHLORIDE: 106 mmol/L (ref 101–111)
CO2: 29 mmol/L (ref 22–32)
CREATININE: 0.87 mg/dL (ref 0.44–1.00)
Calcium: 9.3 mg/dL (ref 8.9–10.3)
GFR calc non Af Amer: >60 mL/min (ref >60)
Glucose, Bld: 234 mg/dL — ABNORMAL HIGH (ref 65–99)
POTASSIUM: 4.3 mmol/L (ref 3.5–5.1)
Sodium: 144 mmol/L (ref 135–145)

## 2014-12-18 LAB — GLUCOSE, CAPILLARY
GLUCOSE-CAPILLARY: 260 mg/dL — AB (ref 65–99)
GLUCOSE-CAPILLARY: 241 mg/dL — AB (ref 65–99)
GLUCOSE-CAPILLARY: 236 mg/dL — AB (ref 65–99)
GLUCOSE-CAPILLARY: 269 mg/dL — AB (ref 65–99)
GLUCOSE-CAPILLARY: 162 mg/dL — AB (ref 65–99)
Glucose-Capillary: 222 mg/dL — ABNORMAL HIGH (ref 65–99)

## 2014-12-18 LAB — PHOSPHORUS: PHOSPHORUS: 3.6 mg/dL (ref 2.5–4.6)

## 2014-12-18 LAB — CBC
HEMATOCRIT: 22.9 % — AB (ref 36.0–46.0)
HEMOGLOBIN: 7.4 g/dL — AB (ref 12.0–15.0)
MCH: 31.9 pg (ref 26.0–34.0)
MCHC: 32.3 g/dL (ref 30.0–36.0)
MCV: 98.7 fL (ref 78.0–100.0)
Platelets: 496 10*3/uL — ABNORMAL HIGH (ref 150–400)
RBC: 2.32 MIL/uL — ABNORMAL LOW (ref 3.87–5.11)
RDW: 15.4 % (ref 11.5–15.5)
WBC: 17.6 10*3/uL — AB (ref 4.0–10.5)

## 2014-12-18 LAB — MAGNESIUM: Magnesium: 2 mg/dL (ref 1.7–2.4)

## 2014-12-18 MED ORDER — INSULIN GLARGINE 100 UNIT/ML ~~LOC~~ SOLN
12.0000 [IU] | Freq: Every day | SUBCUTANEOUS | Status: DC
  Administered 2014-12-18 – 2014-12-24 (×7): 12 [IU] via SUBCUTANEOUS
  Filled 2014-12-18 (×8): qty 0.12

## 2014-12-18 MED ORDER — FENTANYL CITRATE (PF) 100 MCG/2ML IJ SOLN
50.0000 ug | INTRAMUSCULAR | Status: DC | PRN
  Administered 2014-12-18 – 2014-12-20 (×6): 100 ug via INTRAVENOUS
  Filled 2014-12-18 (×6): qty 2

## 2014-12-18 NOTE — Progress Notes (Signed)
PULMONARY / CRITICAL CARE MEDICINE   Name: [name] MRN: [id number] DOB: 1940-08-21    ADMISSION DATE:  12/09/2014 CONSULTATION DATE:  8/20  REFERRING MD :  Evette Doffing   CHIEF COMPLAINT:  Hypoxic respiratory failure   INITIAL PRESENTATION:  74 year old female w sig h/o HTN, DM, schizophrenia. Admitted to IMTS on 8/19 w/ working dx of CAP. Developed progressive airspace disease on CXR and worsening hypoxia on 8/20. PCCM called and pt transferred to the ICU for emergent intubation.   STUDIES:  Head CT scan (8/20) No acute intracranial abnormality Echo (8/23) - Normal LV function; grade 1 diastolic dysfunction with elevated LV filling pressure; moderate MR; trace TR with mildly elevated pulmonary pressure.  SIGNIFICANT EVENTS: 8/20 intubated for respiratory failure. 8/23. Has issues with vent dysynchrony. Vent adjusted with increased rate. Still has poor urine output. Given albumin and fluid bolus. 8/25. Central line placed for access.  SUBJECTIVE:  Wide awake, will plan to extubate  VITAL SIGNS: Temp:  [98.6 F (37 C)-100 F (37.8 C)] 98.6 F (37 C) (08/28 0711) Pulse Rate:  [46-102] 52 (08/28 0711) Resp:  [12-30] 24 (08/28 0711) BP: (94-172)/(40-64) 142/54 mmHg (08/28 0711) SpO2:  [95 %-100 %] 98 % (08/28 0711) FiO2 (%):  [30 %-40 %] 30 % (08/28 0800) Weight:  [180 lb 1.9 oz (81.7 kg)] 180 lb 1.9 oz (81.7 kg) (08/28 0200) HEMODYNAMICS:   VENTILATOR SETTINGS: Vent Mode:  [-] PRVC FiO2 (%):  [30 %-40 %] 30 % Set Rate:  [24 bmp] 24 bmp Vt Set:  [400 mL] 400 mL PEEP:  [5 cmH20] 5 cmH20 Plateau Pressure:  [17 cmH20-19 cmH20] 19 cmH20 INTAKE / OUTPUT:  Intake/Output Summary (Last 24 hours) at 12/18/14 0912 Last data filed at 12/18/14 0800  Gross per 24 hour  Intake 2043.3 ml  Output   1970 ml  Net   73.3 ml    PHYSICAL EXAMINATION: General:  Sedated on vent HENT: NCAT, PERRL, ETT in place,  PULM: coarse crackles bases bilateral  CV: RRR, no mgr, 2+  edema. GI: BS+, soft nontender MSK: normal bulk and tone Derm: Intact, no rash Neuro: sedated on vent, follows commands at times  LABS:  CBC  Recent Labs Lab 12/16/14 0630 12/17/14 0350 12/18/14 0345  WBC 19.4* 17.4* 17.6*  HGB 7.5* 7.4* 7.4*  HCT 24.2* 23.8* 22.9*  PLT 486* 476* 496*   Coag's No results for input(s): APTT, INR in the last 168 hours. BMET  Recent Labs Lab 12/16/14 0630 12/17/14 0350 12/18/14 0345  NA 144 144 144  K 4.5 4.0 4.3  CL 108 107 106  CO2 28 30 29   BUN 31* 36* 43*  CREATININE 0.86 0.79 0.87  GLUCOSE 302* 216* 234*   Electrolytes  Recent Labs Lab 12/16/14 0630 12/17/14 0350 12/18/14 0345  CALCIUM 9.0 9.3 9.3  MG  --   --  2.0  PHOS  --   --  3.6   Sepsis Markers  Recent Labs Lab 12/14/14 0234 12/15/14 0330 12/16/14 1636  PROCALCITON 6.40 3.46 1.37   ABG  Recent Labs Lab 12/13/14 0422 12/14/14 1220 12/15/14 1242  PHART 7.372 7.395 7.469*  PCO2ART 35.1 40.2 36.9  PO2ART 74.8* 77.9* 58.0*   Liver Enzymes  Recent Labs Lab 12/12/14 1135  AST 76*  ALT 48  ALKPHOS 104  BILITOT 0.4  ALBUMIN 1.6*   Cardiac Enzymes No results for input(s): TROPONINI, PROBNP in the last 168 hours. Glucose  Recent Labs Lab 12/16/14 1945 12/16/14 2328  12/17/14 0320 12/17/14 0735 12/17/14 1123 12/17/14 1557  GLUCAP 265* 220* 190* 203* 222* 198*    Imaging No results found.   ASSESSMENT / PLAN: PULMONARY OETT  A: Severe CAP Acute hypoxic respiratory failure in setting of CAP Only tolerating PSV support of 12/5 P:   Full vent support on minimal settings, decrease rr from 24 to 14 and check abg 1300 VAP bundle Lasix 40 IV bid. Goal net negative 1 lt daily. Monitor and replete K while on diuresis Daily SBTs. She still is volume overloaded and will continue  diuresis.   Intake/Output Summary (Last 24 hours) at 12/18/14 0912 Last data filed at 12/18/14 0800  Gross per 24 hour  Intake 2043.3 ml  Output   1970 ml   Net   73.3 ml    CARDIOVASCULAR CVL  A:  Severe sepsis without shock, lactic acid improved with IVF P:  Continue Tele Restart home HTN meds- Norvasc and Altace  RENAL Lab Results  Component Value Date   CREATININE 0.87 12/18/2014   CREATININE 0.79 12/17/2014   CREATININE 0.86 12/16/2014    A:   Mild increase in Renal function, likely related to sepsis P:   Monitor BMET and UOP Replace electrolytes (K) as needed.  GASTROINTESTINAL LFTs are normal. A:   TF P:   TF AT GOAL  HEMATOLOGIC  Recent Labs  12/17/14 0350 12/18/14 0345  HGB 7.4* 7.4*    A:   Normocytic anemia without bleeding P:  Monitor for bleeding Transfusion threshold for Hgb < 7gm/dL  INFECTIOUS A:   Severe CAP Severe Sepsis  Ustrep >> neg  P:   BCx2 8/19>>>neg UC 8/19>>>neg u legionella 8/19>>> Sputum 8/19>>>neg  azithro 8/19>>>off Rocephin 8/19>>> (increase dose to 2gm 8/21)>> 8/27 vanc 8/20>>> 8/25  WBC counts are improving. All cultures remain negative. C.Diff test indicates colonization.  ENDOCRINE CBG (last 3)   Recent Labs  12/17/14 0735 12/17/14 1123 12/17/14 1557  GLUCAP 203* 222* 198*     A:   DM P:   Glucose is trending downwards with lantus addition, 8/28 increase lantus to 12 units Change SSI to moderate dose.   NEUROLOGIC A:   Acute encephalopathy(improving 8/28) Vent dyssynchrony P:   RASS goal: -1  Fentanyl gtt and prn. Precedex drip PRN versed Supportive care   FAMILY  - Updates: Updated daughter over the phone 8/25 - Inter-disciplinary family meet or Palliative Care meeting due by:  8/27  Richardson Landry Minor ACNP [facility] PCCM Pager [phone] till 3 pm If no answer page [phone] 12/18/2014, 9:12 AM

## 2014-12-18 NOTE — Progress Notes (Signed)
Wasted 19ml Fentanyl in sink.  Leverne Humbles, Rn witnessed. [place], [place] C 12/18/2014\ 3:51 PM

## 2014-12-18 NOTE — Progress Notes (Signed)
Tube has been at 22cm.  Order to advance 2.5cm.  Came in and tube was at 19.  Advanced to 23 .  BS equal and waiting on xray for placement

## 2014-12-19 ENCOUNTER — Inpatient Hospital Stay (HOSPITAL_COMMUNITY): Payer: Medicare Other

## 2014-12-19 LAB — GLUCOSE, CAPILLARY
GLUCOSE-CAPILLARY: 232 mg/dL — AB (ref 65–99)
GLUCOSE-CAPILLARY: 180 mg/dL — AB (ref 65–99)
GLUCOSE-CAPILLARY: 250 mg/dL — AB (ref 65–99)
GLUCOSE-CAPILLARY: 194 mg/dL — AB (ref 65–99)
GLUCOSE-CAPILLARY: 166 mg/dL — AB (ref 65–99)
Glucose-Capillary: 195 mg/dL — ABNORMAL HIGH (ref 65–99)
Glucose-Capillary: 191 mg/dL — ABNORMAL HIGH (ref 65–99)
Glucose-Capillary: 167 mg/dL — ABNORMAL HIGH (ref 65–99)

## 2014-12-19 LAB — CBC
HEMATOCRIT: 24.1 % — AB (ref 36.0–46.0)
HEMOGLOBIN: 7.4 g/dL — AB (ref 12.0–15.0)
MCH: 30.5 pg (ref 26.0–34.0)
MCHC: 30.7 g/dL (ref 30.0–36.0)
MCV: 99.2 fL (ref 78.0–100.0)
Platelets: 514 10*3/uL — ABNORMAL HIGH (ref 150–400)
RBC: 2.43 MIL/uL — AB (ref 3.87–5.11)
RDW: 15.5 % (ref 11.5–15.5)
WBC: 15.4 10*3/uL — ABNORMAL HIGH (ref 4.0–10.5)

## 2014-12-19 LAB — BASIC METABOLIC PANEL
ANION GAP: 8 (ref 5–15)
BUN: 40 mg/dL — ABNORMAL HIGH (ref 6–20)
CALCIUM: 9.2 mg/dL (ref 8.9–10.3)
CO2: 31 mmol/L (ref 22–32)
Chloride: 106 mmol/L (ref 101–111)
Creatinine, Ser: 0.82 mg/dL (ref 0.44–1.00)
GLUCOSE: 227 mg/dL — AB (ref 65–99)
POTASSIUM: 4.2 mmol/L (ref 3.5–5.1)
Sodium: 145 mmol/L (ref 135–145)

## 2014-12-19 LAB — LEGIONELLA ANTIGEN, URINE

## 2014-12-19 LAB — MAGNESIUM: Magnesium: 2 mg/dL (ref 1.7–2.4)

## 2014-12-19 LAB — PHOSPHORUS: Phosphorus: 3.5 mg/dL (ref 2.5–4.6)

## 2014-12-19 NOTE — Progress Notes (Signed)
ET tube was out at 19cm. RT advanced tube back to 23cm at lip with no complications.

## 2014-12-19 NOTE — Progress Notes (Signed)
eLink Physician-Brief Progress Note Patient Name: Venia Riveron DOB: 1941/02/13 MRN: [id number]   Date of Service  12/19/2014  HPI/Events of Note  Pt took off soft mitt, and displaced ETT.  RT advanced ETT.  Breath sounds heard by RT and RN.  Matching Vt inhaled/exhaled.  Maintaining oxygenation.   eICU Interventions  Will get STAT CXR.  Adjust sedation, add soft wrist restraints.      Intervention Category Major Interventions: Other:  Kriston Mckinnie 12/19/2014, 3:56 AM

## 2014-12-19 NOTE — Progress Notes (Signed)
Nutrition Follow-up  DOCUMENTATION CODES:   Obesity unspecified  INTERVENTION:   Continue Vital High Protein formula via OGT at goal rate of 35 ml/hr with 30 ml Prostat BID.  TF regimen to provide 1040 kcals, 103 gm protein, 702 ml of free water  Continue liquid MVI daily.  NUTRITION DIAGNOSIS:   Inadequate oral intake related to inability to eat as evidenced by NPO status; ongoing  GOAL:   Provide needs based on ASPEN/SCCM guidelines; met  MONITOR:   TF tolerance, Vent status, Labs, Weight trends, I & O's  REASON FOR ASSESSMENT:   Consult Enteral/tube feeding initiation and management  ASSESSMENT:   74 y.o.Female w sig h/o HTN.DM, schizophrenia. Admitted to IMTS on 8/19 w/ working dx of CAP. Developed progressive airspace disease on CXR and worsening hypoxia on 8/20. PCCM called and pt transferred to the ICU for emergent intubation.   Patient is currently intubated on ventilator support MV: 8.3 L/min Temp (24hrs), Avg:99.4 F (37.4 C), Min:98.6 F (37 C), Max:99.9 F (37.7 C)  Pt has been tolerating her tube feeds at goal with no other difficulties. Family at bedside. They report she has been eating fine PTA. Per MD note, if unable to extubate in the next few days, plan for trach. Pt volume overloaded, diuresing.   Labs and medications reviewed.   Diet Order:  Diet NPO time specified  Skin:   (+2 generalized edema)  Last BM:  8/25  Height:   Ht Readings from Last 1 Encounters:  12/10/14 _0  (1.575 m)    Weight:   Wt Readings from Last 1 Encounters:  12/19/14 178 lb 2.1 oz (80.8 kg)    Ideal Body Weight:  50 kg  BMI:  Body mass index is 32.57 kg/(m^2).  Estimated Nutritional Needs:   Kcal:  [phone]  Protein:  100-110 gm  Fluid:  per MD  EDUCATION NEEDS:   No education needs identified at this time  Corrin Parker, MS, RD, LDN Pager # [phone] After hours/ weekend pager # [phone]

## 2014-12-19 NOTE — Progress Notes (Signed)
Inpatient Diabetes Program Recommendations  AACE/ADA: New Consensus Statement on Inpatient Glycemic Control (2013)  Target Ranges:  Prepandial:   less than 140 mg/dL      Peak postprandial:   less than 180 mg/dL (1-2 hours)      Critically ill patients:  140 - 180 mg/dL   Results for CAIDEN, MONSIVAIS (MRN [id number]) as of 12/19/2014 14:39  Ref. Range 12/18/2014 23:16 12/19/2014 03:17 12/19/2014 07:22  Glucose-Capillary Latest Ref Range: 65-99 mg/dL 232 (H) 191 (H) 180 (H)     Home DM Meds: 70/30 insulin- 25 units bidwc  Lantus 15 units QHS  Actos 30 mg daily  Current DM Orders: Novolog Moderate SSI (0-15 units) Q4 hours              Lantus 12 units QHS    -Patient currently getting Vital HP Tube feeds at 35 cc/hour.  -Note Lantus increased to 12 units QHS last PM.  -Patient still having glucose levels >200 mg/dl.     MD- Please consider adding low dose Novolog tube feed coverage to current in-hospital insulin regimen:  Novolog 3 units Q4 hours (hold if tube feeds held for any reason)     Will follow Wyn Quaker RN, MSN, CDE Diabetes Coordinator Inpatient Glycemic Control Team Team Pager: [phone] (8a-5p)

## 2014-12-19 NOTE — Progress Notes (Signed)
PULMONARY / CRITICAL CARE MEDICINE   Name: Geralene Afshar MRN: [id number] DOB: 08/06/1940    ADMISSION DATE:  12/09/2014 CONSULTATION DATE:  8/20  REFERRING MD :  Evette Doffing   CHIEF COMPLAINT:  Hypoxic respiratory failure   INITIAL PRESENTATION:  74 year old female w sig h/o HTN, DM, schizophrenia. Admitted to IMTS on 8/19 w/ working dx of CAP. Developed progressive airspace disease on CXR and worsening hypoxia on 8/20. PCCM called and pt transferred to the ICU for emergent intubation.   STUDIES:  Head CT scan (8/20) No acute intracranial abnormality Echo (8/23) - Normal LV function; grade 1 diastolic dysfunction with elevated LV filling pressure; moderate MR; trace TR with mildly elevated pulmonary pressure.  SIGNIFICANT EVENTS: 8/20 intubated for respiratory failure. 8/23. Has issues with vent dysynchrony. Vent adjusted with increased rate. Still has poor urine output. Given albumin and fluid bolus. 8/25. Central line placed for access.  SUBJECTIVE:  Accidentally pulled ETT slightly overnight, resulted in cuff leak and volume loss on CXR. Tube was advanced successfully.   VITAL SIGNS: Temp:  [98.4 F (36.9 C)-99.9 F (37.7 C)] 99.1 F (37.3 C) (08/29 0800) Pulse Rate:  [51-134] 65 (08/29 0800) Resp:  [15-33] 24 (08/29 0800) BP: (93-174)/(38-77) 132/54 mmHg (08/29 0800) SpO2:  [95 %-100 %] 97 % (08/29 0800) FiO2 (%):  [30 %] 30 % (08/29 0800) Weight:  [80.8 kg (178 lb 2.1 oz)] 80.8 kg (178 lb 2.1 oz) (08/29 0100) HEMODYNAMICS:   VENTILATOR SETTINGS: Vent Mode:  [-] PSV;CPAP FiO2 (%):  [30 %] 30 % Set Rate:  [14 bmp] 14 bmp Vt Set:  [400 mL] 400 mL PEEP:  [5 cmH20] 5 cmH20 Pressure Support:  [10 cmH20] 10 cmH20 Plateau Pressure:  [15 cmH20-21 cmH20] 16 cmH20 INTAKE / OUTPUT:  Intake/Output Summary (Last 24 hours) at 12/19/14 0931 Last data filed at 12/19/14 0800  Gross per 24 hour  Intake 1358.19 ml  Output   2435 ml  Net -1076.81 ml    PHYSICAL  EXAMINATION: General:  Ill appearing woman, on MV HENT: NCAT, PERRL, ETT in place,  PULM: bibasilar decreased BS and insp crackles, no wheezes CV: RRR, no mgr, 1+ LE edema. GI: BS+, soft nontender MSK: normal bulk and tone Derm: Intact, no rash Neuro: wakes to voice on precedex, followed commands, no focal deficits.   LABS:  CBC  Recent Labs Lab 12/17/14 0350 12/18/14 0345 12/19/14 0422  WBC 17.4* 17.6* 15.4*  HGB 7.4* 7.4* 7.4*  HCT 23.8* 22.9* 24.1*  PLT 476* 496* 514*   Coag's No results for input(s): APTT, INR in the last 168 hours. BMET  Recent Labs Lab 12/17/14 0350 12/18/14 0345 12/19/14 0320  NA 144 144 145  K 4.0 4.3 4.2  CL 107 106 106  CO2 30 29 31   BUN 36* 43* 40*  CREATININE 0.79 0.87 0.82  GLUCOSE 216* 234* 227*   Electrolytes  Recent Labs Lab 12/17/14 0350 12/18/14 0345 12/19/14 0320  CALCIUM 9.3 9.3 9.2  MG  --  2.0 2.0  PHOS  --  3.6 3.5   Sepsis Markers  Recent Labs Lab 12/14/14 0234 12/15/14 0330 12/16/14 1636  PROCALCITON 6.40 3.46 1.37   ABG  Recent Labs Lab 12/14/14 1220 12/15/14 1242 12/18/14 1303  PHART 7.395 7.469* 7.462*  PCO2ART 40.2 36.9 42.2  PO2ART 77.9* 58.0* 74.0*   Liver Enzymes  Recent Labs Lab 12/12/14 1135  AST 76*  ALT 48  ALKPHOS 104  BILITOT 0.4  ALBUMIN 1.6*  Cardiac Enzymes No results for input(s): TROPONINI, PROBNP in the last 168 hours. Glucose  Recent Labs Lab 12/17/14 1937 12/17/14 2311 12/18/14 0342 12/18/14 0819 12/18/14 1205 12/18/14 1558  GLUCAP 260* 241* 236* 222* 269* 162*    Imaging Dg Chest Port 1 View  12/19/2014   CLINICAL DATA:  Endotracheal tube repositioning.  Initial encounter.  EXAM: PORTABLE CHEST - 1 VIEW  COMPARISON:  Chest radiograph performed 12/18/2014  FINDINGS: The patient's endotracheal tube is seen ending 5-6 cm above the carina. An enteric tube is noted extending below the diaphragm. A right IJ line is noted ending about the cavoatrial junction.   The lungs are hypoexpanded. Vascular crowding and vascular congestion are seen. Bibasilar airspace opacities could reflect mild interstitial edema or pneumonia. A small left pleural effusion is suspected. No pneumothorax is seen.  The cardiomediastinal silhouette is mildly enlarged. No acute osseous abnormalities are identified.  IMPRESSION: 1. Endotracheal tube seen ending 5-6 cm above the carina. 2. Lungs hypoexpanded. Vascular congestion and mild cardiomegaly noted. Bibasilar airspace opacities could reflect mild interstitial edema or pneumonia. Suspect small left pleural effusion.   Electronically Signed   By: Garald Balding M.D.   On: 12/19/2014 03:59   Dg Chest Port 1 View  12/18/2014   CLINICAL DATA:  Endotracheal tube  EXAM: PORTABLE CHEST - 1 VIEW  COMPARISON:  12/18/2014 endotracheal tube in stable position approximately 4 cm above the carina. Right central line and NG tube are unchanged. There is cardiomegaly. Consolidation in the left lower lobe. Right basilar atelectasis or infiltrate as well. Suspect small left effusion.  FINDINGS: The heart size and mediastinal contours are within normal limits. Both lungs are clear. The visualized skeletal structures are unremarkable.  IMPRESSION: Consolidation in the left lower lobe concerning for pneumonia. Right basilar atelectasis or infiltrate.   Electronically Signed   By: Rolm Baptise M.D.   On: 12/18/2014 14:16   Dg Chest Port 1 View  12/18/2014   CLINICAL DATA:  Respiratory failure  EXAM: PORTABLE CHEST - 1 VIEW  COMPARISON:  12/16/2014  FINDINGS: Endotracheal tube terminates 5.5 cm above carina. Nasogastric extends beyond the inferior aspect of the film. Right internal jugular line tip at low SVC. Midline trachea. Cardiomegaly accentuated by AP portable technique. Improved to resolved left pleural effusion. Right hemidiaphragm elevation. No pneumothorax. Improved interstitial edema, with mild pulmonary venous congestion remaining. Patchy left worse  than right airspace disease is improved.  IMPRESSION: Improved aeration, with decreased interstitial edema and patchy left greater than right airspace disease.  Small left pleural effusion is improved to resolved.   Electronically Signed   By: Abigail Miyamoto M.D.   On: 12/18/2014 09:38   Dg Abd Portable 1v  12/19/2014   CLINICAL DATA:  Gastric tube placement  EXAM: PORTABLE ABDOMEN - 1 VIEW  COMPARISON:  12/10/2014  FINDINGS: The enteric tube extends into the stomach, with proximal port probably within the gastric lumen. Visible bowel gas pattern is unremarkable.  IMPRESSION: Enteric tube extends into the stomach.   Electronically Signed   By: Andreas Newport M.D.   On: 12/19/2014 03:57     ASSESSMENT / PLAN: PULMONARY OETT 8/20 >>  A: Severe CAP Acute hypoxic respiratory failure in setting of CAP ARDS P:   Push PSV and weaning trials  VAP prevention bundle Lasix 40 IV bid. Goal net negative 1 L daily. Monitor and replete K while on diuresis   CARDIOVASCULAR CVL R IJ 8/25 >>  A:  Severe sepsis without  shock, P:  Continue Tele Home Norvasc and Altace on hold Tolerating diuresis as above  RENAL A:   Mild increase in Renal function, likely related to sepsis, improved P:   Monitor BMET and UOP Replace electrolytes (K) as needed.  GASTROINTESTINAL A:   Nutrition P:   TF AT GOAL  HEMATOLOGIC A:   Normocytic anemia without bleeding P:  Monitor for bleeding Transfusion threshold for Hgb < 7gm/dL  INFECTIOUS A:   Severe CAP Severe Sepsis  Ustrep >> neg  P:   BCx2 8/19>>>neg UC 8/19>>>neg u legionella 8/19>>> Sputum 8/19>>>neg  azithro 8/19>>>off Rocephin 8/19>>> (increase dose to 2gm 8/21)>> 8/27 vanc 8/20>>> 8/25  WBC counts are improving. All cultures remain negative. C.Diff test indicates colonization. ? Whether we can d/c isolation. Will inquire with Infection Control  ENDOCRINE A:   DM P:   Glucose is trending downwards with lantus addition, 8/28  increased lantus to 12 units Changed SSI to moderate dose.   NEUROLOGIC A:   Acute encephalopathy(improving 8/28) Vent dyssynchrony P:   RASS goal: -1  Fentanyl prn. Precedex drip PRN versed Supportive care   FAMILY  - Updates: Updated daughter over the phone 8/25 - Inter-disciplinary family meet or Palliative Care meeting due by:  8/27; need to arrange this week   Independent CC time 40 minutes   Baltazar Apo, MD, PhD 12/19/2014, 9:46 AM [facility] [phone] or if no answer [phone]

## 2014-12-20 ENCOUNTER — Inpatient Hospital Stay (HOSPITAL_COMMUNITY): Payer: Medicare Other

## 2014-12-20 DIAGNOSIS — F209 Schizophrenia, unspecified: Secondary | ICD-10-CM

## 2014-12-20 DIAGNOSIS — I10 Essential (primary) hypertension: Secondary | ICD-10-CM

## 2014-12-20 LAB — BASIC METABOLIC PANEL
Anion gap: 8 (ref 5–15)
BUN: 39 mg/dL — ABNORMAL HIGH (ref 6–20)
CHLORIDE: 106 mmol/L (ref 101–111)
CO2: 31 mmol/L (ref 22–32)
CREATININE: 0.85 mg/dL (ref 0.44–1.00)
Calcium: 9.3 mg/dL (ref 8.9–10.3)
GFR calc non Af Amer: >60 mL/min (ref >60)
GLUCOSE: 223 mg/dL — AB (ref 65–99)
Potassium: 3.5 mmol/L (ref 3.5–5.1)
Sodium: 145 mmol/L (ref 135–145)

## 2014-12-20 LAB — CBC
HEMATOCRIT: 23.9 % — AB (ref 36.0–46.0)
Hemoglobin: 7.1 g/dL — ABNORMAL LOW (ref 12.0–15.0)
MCH: 29.7 pg (ref 26.0–34.0)
MCHC: 29.7 g/dL — ABNORMAL LOW (ref 30.0–36.0)
MCV: 100 fL (ref 78.0–100.0)
Platelets: 507 10*3/uL — ABNORMAL HIGH (ref 150–400)
RBC: 2.39 MIL/uL — ABNORMAL LOW (ref 3.87–5.11)
RDW: 15.6 % — AB (ref 11.5–15.5)
WBC: 15.2 10*3/uL — AB (ref 4.0–10.5)

## 2014-12-20 LAB — GLUCOSE, CAPILLARY
GLUCOSE-CAPILLARY: 141 mg/dL — AB (ref 65–99)
Glucose-Capillary: 199 mg/dL — ABNORMAL HIGH (ref 65–99)
Glucose-Capillary: 160 mg/dL — ABNORMAL HIGH (ref 65–99)
Glucose-Capillary: 107 mg/dL — ABNORMAL HIGH (ref 65–99)
Glucose-Capillary: 127 mg/dL — ABNORMAL HIGH (ref 65–99)

## 2014-12-20 LAB — PHOSPHORUS: Phosphorus: 2.7 mg/dL (ref 2.5–4.6)

## 2014-12-20 LAB — MAGNESIUM: Magnesium: 2.1 mg/dL (ref 1.7–2.4)

## 2014-12-20 MED ORDER — LORAZEPAM 2 MG/ML IJ SOLN
1.0000 mg | INTRAMUSCULAR | Status: DC | PRN
  Administered 2014-12-20: 1 mg via INTRAVENOUS
  Administered 2014-12-20: 2 mg via INTRAVENOUS
  Filled 2014-12-20 (×2): qty 1

## 2014-12-20 MED ORDER — POTASSIUM CHLORIDE 10 MEQ/100ML IV SOLN
10.0000 meq | INTRAVENOUS | Status: AC
  Administered 2014-12-20 (×2): 10 meq via INTRAVENOUS
  Filled 2014-12-20 (×2): qty 100

## 2014-12-20 MED ORDER — ALBUTEROL SULFATE (2.5 MG/3ML) 0.083% IN NEBU
INHALATION_SOLUTION | RESPIRATORY_TRACT | Status: AC
  Filled 2014-12-20: qty 3

## 2014-12-20 MED ORDER — ALBUTEROL SULFATE (2.5 MG/3ML) 0.083% IN NEBU
2.5000 mg | INHALATION_SOLUTION | RESPIRATORY_TRACT | Status: DC | PRN
  Administered 2014-12-20 (×3): 2.5 mg via RESPIRATORY_TRACT
  Filled 2014-12-20 (×2): qty 3

## 2014-12-20 NOTE — Progress Notes (Signed)
PULMONARY / CRITICAL CARE MEDICINE   Name: Pegah Segel MRN: [id number] DOB: [birth year]    ADMISSION DATE:  12/09/2014 CONSULTATION DATE:  8/20  REFERRING MD :  Evette Doffing   CHIEF COMPLAINT:  Hypoxic respiratory failure   INITIAL PRESENTATION:  74 year old female w sig h/o HTN, DM, schizophrenia. Admitted to IMTS on 8/19 w/ working dx of CAP. Developed progressive airspace disease on CXR and worsening hypoxia on 8/20. PCCM called and pt transferred to the ICU for emergent intubation.   STUDIES:  Head CT scan (8/20) No acute intracranial abnormality Echo (8/23) - Normal LV function; grade 1 diastolic dysfunction with elevated LV filling pressure; moderate MR; trace TR with mildly elevated pulmonary pressure.  SIGNIFICANT EVENTS: 8/20 intubated for respiratory failure. 8/23. Has issues with vent dysynchrony. Vent adjusted with increased rate. Still has poor urine output. Given albumin and fluid bolus. 8/25. Central line placed for access.  SUBJECTIVE:  Tolerated PSV 10 most of the day 8/29 ETT was high again last pm, advanced.  precedex weaned, awake this am  VITAL SIGNS: Temp:  [98.8 F (37.1 C)-99.9 F (37.7 C)] 99.5 F (37.5 C) (08/30 0700) Pulse Rate:  [57-100] 73 (08/30 0724) Resp:  [16-31] 18 (08/30 0724) BP: (107-165)/(40-92) 137/77 mmHg (08/30 0724) SpO2:  [94 %-100 %] 100 % (08/30 0724) FiO2 (%):  [30 %] 30 % (08/30 0724) Weight:  [81.4 kg (179 lb 7.3 oz)] 81.4 kg (179 lb 7.3 oz) (08/30 0417) HEMODYNAMICS:   VENTILATOR SETTINGS: Vent Mode:  [-] PSV;CPAP FiO2 (%):  [30 %] 30 % Set Rate:  [14 bmp] 14 bmp Vt Set:  [400 mL] 400 mL PEEP:  [5 cmH20] 5 cmH20 Pressure Support:  [8 cmH20-10 cmH20] 8 cmH20 Plateau Pressure:  [14 cmH20-17 cmH20] 14 cmH20 INTAKE / OUTPUT:  Intake/Output Summary (Last 24 hours) at 12/20/14 0813 Last data filed at 12/20/14 0700  Gross per 24 hour  Intake 1067.49 ml  Output   2150 ml  Net -1082.51 ml    PHYSICAL  EXAMINATION: General:  Ill appearing woman, on MV HENT: NCAT, PERRL, ETT in place,  PULM: bibasilar decreased BS and insp crackles, no wheezes CV: RRR, no mgr, 1+ LE edema. GI: BS+, soft nontender MSK: normal bulk and tone Derm: Intact, no rash Neuro: awake on precedex, followed commands, no focal deficits.   LABS:  CBC  Recent Labs Lab 12/18/14 0345 12/19/14 0422 12/20/14 0430  WBC 17.6* 15.4* 15.2*  HGB 7.4* 7.4* 7.1*  HCT 22.9* 24.1* 23.9*  PLT 496* 514* 507*   Coag's No results for input(s): APTT, INR in the last 168 hours. BMET  Recent Labs Lab 12/18/14 0345 12/19/14 0320 12/20/14 0430  NA 144 145 145  K 4.3 4.2 3.5  CL 106 106 106  CO2 29 31 31   BUN 43* 40* 39*  CREATININE 0.87 0.82 0.85  GLUCOSE 234* 227* 223*   Electrolytes  Recent Labs Lab 12/18/14 0345 12/19/14 0320 12/20/14 0430  CALCIUM 9.3 9.2 9.3  MG 2.0 2.0 2.1  PHOS 3.6 3.5 2.7   Sepsis Markers  Recent Labs Lab 12/14/14 0234 12/15/14 0330 12/16/14 1636  PROCALCITON 6.40 3.46 1.37   ABG  Recent Labs Lab 12/14/14 1220 12/15/14 1242 12/18/14 1303  PHART 7.395 7.469* 7.462*  PCO2ART 40.2 36.9 42.2  PO2ART 77.9* 58.0* 74.0*   Liver Enzymes No results for input(s): AST, ALT, ALKPHOS, BILITOT, ALBUMIN in the last 168 hours. Cardiac Enzymes No results for input(s): TROPONINI, PROBNP in the last 168  hours. Glucose  Recent Labs Lab 12/19/14 1122 12/19/14 1517 12/19/14 2039 12/19/14 2320 12/20/14 0410 12/20/14 0712  GLUCAP 250* 194* 167* 166* 199* 160*    Imaging Dg Chest Port 1 View  12/20/2014   CLINICAL DATA:  74 year old female with acute respiratory failure. Subsequent encounter.  EXAM: PORTABLE CHEST - 1 VIEW  COMPARISON:  12/19/2014.  FINDINGS: Rotation to the right. Endotracheal tube tip 3.5 cm above the carina. Right central line tip caval atrial junction. Nasogastric tube courses below the diaphragm. Tip is not included on the present exam.  Slightly improved  asymmetric airspace disease now more notable on left may represent resolving pulmonary edema or infiltrate.  Cardiomegaly.  No gross pneumothorax.  Elevated right hemidiaphragm unchanged.  IMPRESSION: Slight improved aeration right lung. Residual asymmetric airspace disease greater on left may represent resolving pulmonary edema or infiltrate.  Rotated exam with cardiomegaly.   Electronically Signed   By: Genia Del M.D.   On: 12/20/2014 07:50   Dg Chest Port 1 View  12/19/2014   CLINICAL DATA:  Endotracheal tube repositioning.  Initial encounter.  EXAM: PORTABLE CHEST - 1 VIEW  COMPARISON:  Chest radiograph performed 12/18/2014  FINDINGS: The patient's endotracheal tube is seen ending 5-6 cm above the carina. An enteric tube is noted extending below the diaphragm. A right IJ line is noted ending about the cavoatrial junction.  The lungs are hypoexpanded. Vascular crowding and vascular congestion are seen. Bibasilar airspace opacities could reflect mild interstitial edema or pneumonia. A small left pleural effusion is suspected. No pneumothorax is seen.  The cardiomediastinal silhouette is mildly enlarged. No acute osseous abnormalities are identified.  IMPRESSION: 1. Endotracheal tube seen ending 5-6 cm above the carina. 2. Lungs hypoexpanded. Vascular congestion and mild cardiomegaly noted. Bibasilar airspace opacities could reflect mild interstitial edema or pneumonia. Suspect small left pleural effusion.   Electronically Signed   By: Garald Balding M.D.   On: 12/19/2014 03:59   Dg Chest Port 1 View  12/18/2014   CLINICAL DATA:  Endotracheal tube  EXAM: PORTABLE CHEST - 1 VIEW  COMPARISON:  12/18/2014 endotracheal tube in stable position approximately 4 cm above the carina. Right central line and NG tube are unchanged. There is cardiomegaly. Consolidation in the left lower lobe. Right basilar atelectasis or infiltrate as well. Suspect small left effusion.  FINDINGS: The heart size and mediastinal  contours are within normal limits. Both lungs are clear. The visualized skeletal structures are unremarkable.  IMPRESSION: Consolidation in the left lower lobe concerning for pneumonia. Right basilar atelectasis or infiltrate.   Electronically Signed   By: Rolm Baptise M.D.   On: 12/18/2014 14:16   Dg Abd Portable 1v  12/19/2014   CLINICAL DATA:  Gastric tube placement  EXAM: PORTABLE ABDOMEN - 1 VIEW  COMPARISON:  12/10/2014  FINDINGS: The enteric tube extends into the stomach, with proximal port probably within the gastric lumen. Visible bowel gas pattern is unremarkable.  IMPRESSION: Enteric tube extends into the stomach.   Electronically Signed   By: Andreas Newport M.D.   On: 12/19/2014 03:57     ASSESSMENT / PLAN: PULMONARY OETT 8/20 >>  A: Severe CAP Acute hypoxic respiratory failure in setting of CAP ARDS P:   Push PSV and weaning trials, she is close to extubation. Assess for possible extubation this am 8/30. Will need to push pulm hygiene once tube removed.  VAP prevention bundle Lasix 40 IV bid. Goal net negative 1 L daily. Monitor and replete K  while on diuresis   CARDIOVASCULAR CVL R IJ 8/25 >>  A:  Severe sepsis without shock, P:  Continue Tele Home Norvasc and Altace on hold, may need to restart once sedation off completely  Tolerating diuresis as above  RENAL A:   Mild increase in Renal function, likely related to sepsis, improved P:   Monitor BMET and UOP Replace electrolytes (K) as needed.  GASTROINTESTINAL A:   Nutrition P:   TF AT GOAL, plan SLP eval once extubated  HEMATOLOGIC A:   Normocytic anemia without bleeding P:  Monitor for bleeding Transfusion threshold for Hgb < 7gm/dL  INFECTIOUS A:   Severe CAP Severe Sepsis  Ustrep >> neg  P:   BCx2 8/19>>>neg UC 8/19>>>neg u legionella 8/19>>> Sputum 8/19>>>neg  azithro 8/19>>>off Rocephin 8/19>>> (increase dose to 2gm 8/21)>> 8/27 vanc 8/20>>> 8/25  All cultures remain  negative. C.Diff test indicates colonization. Looks like she needs to be on C diff isolation with this lab result ??   ENDOCRINE A:   DM P:   Glucose is trending downwards with lantus addition, 8/28 increased lantus to 12 units SSI at moderate dose.   NEUROLOGIC A:   Acute encephalopathy(improving 8/28) Vent dyssynchrony P:   RASS goal: 0 Fentanyl prn. Precedex drip, wean to off PRN versed Supportive care   FAMILY  - Updates: Updated daughter over the phone 8/25 - Inter-disciplinary family meet or Palliative Care meeting due by:  8/27; need to arrange this week   Independent CC time 40 minutes   Baltazar Apo, MD, PhD 12/20/2014, 8:13 AM [facility] [phone] or if no answer [phone]

## 2014-12-20 NOTE — Procedures (Signed)
Extubation Procedure Note  Patient Details:   Name: [name] DOB: Aug 22, 1940 MRN: [id number]   Airway Documentation:  Airway 7.5 mm (Active)  Secured at (cm) 23 cm 12/20/2014  7:24 AM  Measured From Lips 12/20/2014  7:24 AM  Secured Location Right 12/20/2014  7:24 AM  Secured By [employer] 12/20/2014  7:24 AM  Tube Holder Repositioned Yes 12/20/2014  7:24 AM  Cuff Pressure (cm H2O) 25 cm H2O 12/20/2014  3:05 AM  Site Condition Dry 12/20/2014  7:24 AM    Evaluation  O2 sats: stable throughout Complications: No apparent complications Patient did tolerate procedure well. Bilateral Breath Sounds: Clear, Diminished Suctioning: Airway Yes   Patient able to speak, good cough, able to get 1000 mL using IS, placed on nasal cannula 4 Lpm with humidity, sat 98%.  [place] Amber Frank Novelo 12/20/2014, 9:06 AM

## 2014-12-20 NOTE — Progress Notes (Signed)
PCCM Interval Progress Note  HPI:  Pt extubated earlier this AM.  Has been having anxiety with agitation this afternoon that has gradually progressed.  She is NPO until she is evaluated by SLP.  Interventions:  1-4mg  Ativan q4hrs PRN.  If additional meds needed, will try IV haldol (she takes haldol PO as outpatient).  As last resort, can resume precedex gtt.   Montey Hora, [facility] Pager: [phone]  or [phone] 12/20/2014, 3:19 PM

## 2014-12-20 NOTE — Care Management Note (Signed)
Case Management Note  Patient Details  Name: [name] MRN: [id number] Date of Birth: [birth year]  Subjective/Objective:   Pt admitted with CAP; extubated earlier today to 4L/[place].                   Action/Plan: PT/OT evaluations pending, likely tomorrow.  Will follow progress.   Expected Discharge Date:                  Expected Discharge Plan:  Skilled Nursing Facility  In-House Referral:  Clinical Social Work  Discharge planning Services  CM Consult  Post Acute Care Choice:    Choice offered to:     DME Arranged:    DME Agency:     HH Arranged:    [place] Agency:     Status of Service:  In process, will continue to follow  Medicare Important Message Given:  Yes-third notification given Date Medicare IM Given:    Medicare IM give by:    Date Additional Medicare IM Given:    Additional Medicare Important Message give by:     If discussed at [place] of Stay Meetings, dates discussed:    Additional Comments:  Reinaldo Raddle, RN, BSN  Trauma/Neuro ICU Case Manager [phone]

## 2014-12-21 ENCOUNTER — Inpatient Hospital Stay (HOSPITAL_COMMUNITY): Payer: Medicare Other

## 2014-12-21 DIAGNOSIS — F0390 Unspecified dementia without behavioral disturbance: Secondary | ICD-10-CM

## 2014-12-21 LAB — BASIC METABOLIC PANEL
ANION GAP: 13 (ref 5–15)
BUN: 24 mg/dL — ABNORMAL HIGH (ref 6–20)
CALCIUM: 8.9 mg/dL (ref 8.9–10.3)
CHLORIDE: 106 mmol/L (ref 101–111)
CO2: 30 mmol/L (ref 22–32)
Creatinine, Ser: 0.85 mg/dL (ref 0.44–1.00)
GFR calc non Af Amer: >60 mL/min (ref >60)
GLUCOSE: 126 mg/dL — AB (ref 65–99)
Potassium: 3.6 mmol/L (ref 3.5–5.1)
Sodium: 149 mmol/L — ABNORMAL HIGH (ref 135–145)

## 2014-12-21 LAB — GLUCOSE, CAPILLARY
GLUCOSE-CAPILLARY: 118 mg/dL — AB (ref 65–99)
GLUCOSE-CAPILLARY: 103 mg/dL — AB (ref 65–99)
GLUCOSE-CAPILLARY: 162 mg/dL — AB (ref 65–99)
GLUCOSE-CAPILLARY: 189 mg/dL — AB (ref 65–99)
Glucose-Capillary: 84 mg/dL (ref 65–99)
Glucose-Capillary: 123 mg/dL — ABNORMAL HIGH (ref 65–99)

## 2014-12-21 LAB — PHOSPHORUS: Phosphorus: 3.4 mg/dL (ref 2.5–4.6)

## 2014-12-21 MED ORDER — ADULT MULTIVITAMIN LIQUID CH
5.0000 mL | Freq: Every day | ORAL | Status: DC
  Administered 2014-12-23 – 2014-12-25 (×3): 5 mL via ORAL
  Filled 2014-12-21 (×7): qty 5

## 2014-12-21 MED ORDER — ENSURE ENLIVE PO LIQD
237.0000 mL | Freq: Two times a day (BID) | ORAL | Status: DC
  Administered 2014-12-21 – 2014-12-25 (×9): 237 mL via ORAL

## 2014-12-21 MED ORDER — BENZTROPINE MESYLATE 2 MG PO TABS
2.0000 mg | ORAL_TABLET | Freq: Every day | ORAL | Status: DC
  Administered 2014-12-21 – 2014-12-27 (×6): 2 mg via ORAL
  Filled 2014-12-21 (×9): qty 1

## 2014-12-21 MED ORDER — MEMANTINE HCL 10 MG PO TABS
10.0000 mg | ORAL_TABLET | Freq: Two times a day (BID) | ORAL | Status: DC
  Administered 2014-12-21 – 2014-12-27 (×12): 10 mg via ORAL
  Filled 2014-12-21 (×15): qty 1

## 2014-12-21 MED ORDER — RESOURCE THICKENUP CLEAR PO POWD
Freq: Once | ORAL | Status: DC
  Filled 2014-12-21: qty 125

## 2014-12-21 MED ORDER — DONEPEZIL HCL 10 MG PO TABS
10.0000 mg | ORAL_TABLET | Freq: Every day | ORAL | Status: DC
  Administered 2014-12-21 – 2014-12-26 (×6): 10 mg via ORAL
  Filled 2014-12-21 (×7): qty 1

## 2014-12-21 MED ORDER — AMITRIPTYLINE HCL 25 MG PO TABS
25.0000 mg | ORAL_TABLET | Freq: Every day | ORAL | Status: DC
Start: 2014-12-21 — End: 2014-12-27
  Administered 2014-12-21 – 2014-12-26 (×6): 25 mg via ORAL
  Filled 2014-12-21 (×7): qty 1

## 2014-12-21 MED ORDER — FENTANYL CITRATE (PF) 100 MCG/2ML IJ SOLN
50.0000 ug | INTRAMUSCULAR | Status: DC | PRN
  Administered 2014-12-21 – 2014-12-22 (×2): 50 ug via INTRAVENOUS
  Filled 2014-12-21 (×2): qty 2

## 2014-12-21 NOTE — Progress Notes (Signed)
OT Cancellation Note  Patient Details Name: [name] MRN: [id number] DOB: 1940/09/17   Cancelled Treatment:    Reason Eval/Treat Not Completed: Patient at procedure or test/ unavailable Attempted to see pt x 2. Pt had central line pulled this am and on bedrest. This pm, pt having swallow test. Will attempt later if possible.  [place], OTR/L  [phone] 12/21/2014 12/21/2014, 2:06 PM

## 2014-12-21 NOTE — Evaluation (Signed)
Clinical/Bedside Swallow Evaluation Patient Details  Name: [name] MRN: [id number] Date of Birth: 04-Dec-1940  Today's Date: 12/21/2014 Time: SLP Start Time (ACUTE ONLY): 0919 SLP Stop Time (ACUTE ONLY): 0933 SLP Time Calculation (min) (ACUTE ONLY): 14 min  Past Medical History:  Past Medical History  Diagnosis Date  . Anemia, iron deficiency   . Anxiety   . CAD (coronary artery disease)   . Type II or unspecified type diabetes mellitus without mention of complication, not stated as uncontrolled   . GERD (gastroesophageal reflux disease)   . HTN (hypertension)   . Peripheral neuropathy   . Schizophrenia   . Hyperlipidemia   . Allergic rhinitis   . 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 Dementia   . IBS (irritable bowel syndrome)   . Syncope     EP study 1996 w/atrial tachycardia  . Esophageal motility disorder   . Renal insufficiency    Past Surgical History:  Past Surgical History  Procedure Laterality Date  . Abdominal hysterectomy    . Ptca  1995  . Knee arthroplasty      Right   . Breast biopsy      Negative   HPI:  74 year old female with h/o HTN, DM, schizophrenia, admitted 8/19 w/ working dx of CAP. Developed progressive airspace disease on CXR and worsening hypoxia on 8/20. Transferred to the ICU for emergent intubation. Extubated 8/30 am.    Assessment / Plan / Recommendation Clinical Impression  Patient presents with a suspected acute reversible dysphagia s/p prolonged intubation characterized by suspected sensory and motor changes to swallowing function impacting airway protection. At this time, aspiration risk appears high with all pos at bedside warranting instrumental study to determine potential to advance diet. Will plan for MBS this am.     Aspiration Risk  Severe    Diet Recommendation NPO   Medication Administration: Via alternative means    Other  Recommendations Oral Care Recommendations: Oral care QID        Pertinent Vitals/Pain n/a        Swallow Study    General Other Pertinent Information: 74 year old female with h/o HTN, DM, schizophrenia, admitted 8/19 w/ working dx of CAP. Developed progressive airspace disease on CXR and worsening hypoxia on 8/20. Transferred to the ICU for emergent intubation. Extubated 8/30 am.  Type of Study: Bedside swallow evaluation Previous Swallow Assessment: esophagram complete in 2010-Nonspecific esophageal motility disorder with severe esophageal Diet Prior to this Study: NPO Temperature Spikes Noted: No Respiratory Status: Supplemental O2 delivered via (comment) (nasal cannula at 4 L) History of Recent Intubation: Yes Length of Intubations (days): 10 days Date extubated: 12/20/14 Behavior/Cognition: Alert;Cooperative;Pleasant mood;Confused Oral Cavity - Dentition: Poor condition;Missing dentition Self-Feeding Abilities: Able to feed self Patient Positioning: Upright in bed Baseline Vocal Quality: Hoarse;Low vocal intensity Volitional Cough: Weak;Congested Volitional Swallow: Able to elicit    Oral/Motor/Sensory Function Overall Oral Motor/Sensory Function: Appears within functional limits for tasks assessed   Ice Chips Ice chips: Impaired Presentation: Spoon Oral Phase Functional Implications: Prolonged oral transit Pharyngeal Phase Impairments: Throat Clearing - Immediate   Thin Liquid Thin Liquid: Impaired Presentation: Spoon Pharyngeal  Phase Impairments: Suspected delayed Swallow;Cough - Immediate    Nectar Thick Nectar Thick Liquid: Not tested   Honey Thick Honey Thick Liquid: Not tested   Puree Puree: Impaired Presentation: Spoon Pharyngeal Phase Impairments: Multiple swallows   Solid   GO   Jane Bergerson MA, CCC-SLP [phone]  Solid: Not tested       [name] [name] 12/21/2014,9:37 AM

## 2014-12-21 NOTE — Evaluation (Signed)
Physical Therapy Evaluation Patient Details Name: [name] MRN: [id number] DOB: 1941-03-03 Today's Date: 12/21/2014   History of Present Illness   74 year-old lady with history of CAD, ypertension, type II diabetes, hyperlipidemia, and schizophrenia who presented with weakness, cough, fever, and diarrhea. Chest x-ray consistent with CAP. Intubated 8/20. Extubated 8/30.   Clinical Impression  Pt admitted with/for weakness, cough, fever. X-ray showing PNA.  Pt currently limited functionally due to the problems listed. ( See problems list.)   Pt will benefit from PT to maximize function and safety in order to get ready for next venue listed below.     Follow Up Recommendations SNF    Equipment Recommendations  None recommended by PT;Other (comment) (TBA at next venue)    Recommendations for Other Services       Precautions / Restrictions Precautions Precautions: Fall;Other (comment) (watch HR)      Mobility  Bed Mobility Overal bed mobility: +2 for physical assistance;Needs Assistance Bed Mobility: Supine to Sit;Sit to Supine     Supine to sit: +2 for physical assistance;Mod assist Sit to supine: Mod assist;+2 for physical assistance   General bed mobility comments: Pt assisting with bed mobility. delay in initiation  Transfers Overall transfer level: Needs assistance Equipment used: 2 person hand held assist Transfers: Sit to/from Stand Sit to Stand: Mod assist;+2 physical assistance         General transfer comment: Pt initiates transitional movment  Ambulation/Gait             General Gait Details: not ready yet  Stairs            Wheelchair Mobility    Modified Rankin (Stroke Patients Only)       Balance Overall balance assessment: Needs assistance Sitting-balance support: No upper extremity supported;Feet supported Sitting balance-Leahy Scale: Poor Sitting balance - Comments: initially pt with significant posterior lean. At end of  session, pt able to maintain upright sitting EOB with S Postural control: Posterior lean   Standing balance-Leahy Scale: Poor Standing balance comment: stood up at EOB with 2 person assist working on upright stance, but with posterior bias.                             Pertinent Vitals/Pain Pain Assessment: No/denies pain    Home Living Family/patient expects to be discharged to:: Skilled nursing facility                      Prior Function Level of Independence: Needs assistance   Gait / Transfers Assistance Needed: Pt used cane at times and RW at times. Unsure.  ADL's / Homemaking Assistance Needed: CNA 3 hrs/day assisted with self care and IADL needs        Hand Dominance        Extremity/Trunk Assessment   Upper Extremity Assessment: Generalized weakness           Lower Extremity Assessment: Overall WFL for tasks assessed      Cervical / Trunk Assessment: Normal  Communication   Communication: Expressive difficulties  Cognition Arousal/Alertness: Awake/alert Behavior During Therapy: Flat affect;Anxious Overall Cognitive Status: No family/caregiver present to determine baseline cognitive functioning                      General Comments      Exercises        Assessment/Plan    PT Assessment Patient needs continued  PT services  PT Diagnosis Generalized weakness;Other (comment) (decreased activity tolerance)   PT Problem List Decreased strength;Decreased activity tolerance;Decreased balance;Decreased mobility;Decreased coordination;Decreased knowledge of use of DME;Cardiopulmonary status limiting activity  PT Treatment Interventions DME instruction;Gait training;Functional mobility training;Therapeutic activities;Balance training;Patient/family education   PT Goals (Current goals can be found in the Care Plan section) Acute Rehab PT Goals Patient Stated Goal: to go home PT Goal Formulation: With patient Time For Goal  Achievement: 11/30/14 Potential to Achieve Goals: Good    Frequency Min 3X/week   Barriers to discharge        Co-evaluation PT/OT/SLP Co-Evaluation/Treatment: Yes Reason for Co-Treatment: Complexity of the patient's impairments (multi-system involvement);For patient/therapist safety PT goals addressed during session: Mobility/safety with mobility OT goals addressed during session: ADL's and self-care;Strengthening/ROM       End of Session   Activity Tolerance: Patient tolerated treatment well;Patient limited by fatigue Patient left: in bed Nurse Communication: Mobility status         Time: 5329-9242 PT Time Calculation (min) (ACUTE ONLY): 35 min   Charges:   PT Evaluation $Initial PT Evaluation Tier I: 1 Procedure     PT G Codes:        Kacelyn Rowzee, Tessie Fass 12/21/2014, 6:25 PM 12/21/2014  Donnella Sham, PT [phone]  (pager)

## 2014-12-21 NOTE — Progress Notes (Signed)
Nutrition Follow-up  DOCUMENTATION CODES:   Obesity unspecified  INTERVENTION:   Ensure Enlive po BID, each supplement provides 350 kcal and 20 grams of protein   NUTRITION DIAGNOSIS:   Inadequate oral intake related to dysphagia as evidenced by meal completion < 50%.  ongoing  GOAL:   Patient will meet greater than or equal to 90% of their needs  Not yet met.   MONITOR:   PO intake, Supplement acceptance, Diet advancement, Labs, I & O's  ASSESSMENT:   74 y.o.Female w sig h/o HTN.DM, schizophrenia. Admitted to IMTS on 8/19 w/ working dx of CAP. Developed progressive airspace disease on CXR and worsening hypoxia on 8/20. PCCM called and pt transferred to the ICU for emergent intubation.   Pt intubated 8/20 with ARDS and extubated 8/30. Pt failed bedside swallow eval and had MBS 8/31 and diet was advanced to Dysphagia 1 with Honey thickened liquids.  Pt discussed during ICU rounds and with RN.  Pt with psych issues PTA and restarting those medications.  Sodium elevated CBG's: 103-123  Diet Order:  DIET - DYS 1 Room service appropriate?: Yes; Fluid consistency:: Honey Thick  Skin:  Reviewed, no issues  Last BM:  8/31  Height:   Ht Readings from Last 1 Encounters:  12/10/14 '5\' 2"'  (1.575 m)    Weight:   Wt Readings from Last 1 Encounters:  12/21/14 176 lb 12.9 oz (80.2 kg)    Ideal Body Weight:  50 kg  BMI:  Body mass index is 32.33 kg/(m^2).  Estimated Nutritional Needs:   Kcal:  1500-1700  Protein:  75-85 grams  Fluid:  > 1.5 L/day  EDUCATION NEEDS:   No education needs identified at this time  [place], [place], [place] Pager [phone] After Hours Pager

## 2014-12-21 NOTE — Progress Notes (Signed)
I was contacted by PCCM to assume care of this patient 12/22/2014. A review of the chart reveals the patient was originally admitted by the internal medicine teaching service. [facility] will therefore not assume care of this patient on 12/22/2014 and it is suggested that PCCM contact the teaching service to arrange for this transfer.  Cherene Altes, MD [facility] For Consults/Admissions - Flow Manager [phone] Office [phone]  Contact MD directly via text page:  [email or web address]  password [facility] [address]

## 2014-12-21 NOTE — Progress Notes (Signed)
SLP services:  MBS completed, posted under imaging.   Pt safe to begin Dysphagia 1 diet with honey-thick liquids, meds whole in puree. Full supervision with meals.  Christin Moline L. Tivis Ringer, [place] CCC/SLP Pager [phone]

## 2014-12-21 NOTE — Progress Notes (Signed)
PULMONARY / CRITICAL CARE MEDICINE   Name: [name] MRN: [id number] DOB: 1940/11/11    ADMISSION DATE:  12/09/2014 CONSULTATION DATE:  8/20  REFERRING MD :  Evette Doffing   CHIEF COMPLAINT:  Hypoxic respiratory failure   INITIAL PRESENTATION:  74 year old female w sig h/o HTN, DM, schizophrenia. Admitted to IMTS on 8/19 w/ working dx of CAP. Developed progressive airspace disease on CXR and worsening hypoxia on 8/20. PCCM called and pt transferred to the ICU for emergent intubation.   STUDIES:  Head CT scan (8/20) No acute intracranial abnormality Echo (8/23) - Normal LV function; grade 1 diastolic dysfunction with elevated LV filling pressure; moderate MR; trace TR with mildly elevated pulmonary pressure.  SIGNIFICANT EVENTS: 8/20 intubated for respiratory failure. 8/23. Has issues with vent dysynchrony. Vent adjusted with increased rate. Still has poor urine output. Given albumin and fluid bolus. 8/25. Central line placed for access.  SUBJECTIVE:  Tolerated PSV 10 most of the day 8/29 ETT was high again last pm, advanced.  precedex weaned, awake this am  VITAL SIGNS: Temp:  [98.5 F (36.9 C)-99.8 F (37.7 C)] 99.5 F (37.5 C) (08/31 0800) Pulse Rate:  [81-128] 121 (08/31 0800) Resp:  [18-41] 18 (08/31 0800) BP: (111-191)/(43-99) 159/69 mmHg (08/31 0800) SpO2:  [90 %-100 %] 100 % (08/31 0800) Weight:  [80.2 kg (176 lb 12.9 oz)] 80.2 kg (176 lb 12.9 oz) (08/31 0455) HEMODYNAMICS:   VENTILATOR SETTINGS:   INTAKE / OUTPUT:  Intake/Output Summary (Last 24 hours) at 12/21/14 0933 Last data filed at 12/21/14 0800  Gross per 24 hour  Intake    410 ml  Output    550 ml  Net   -140 ml    PHYSICAL EXAMINATION: General:  Ill appearing woman, on MV HENT: NCAT, PERRL, ETT in place,  PULM: bibasilar decreased BS and insp crackles, no wheezes CV: RRR, no mgr, 1+ LE edema. GI: BS+, soft nontender MSK: normal bulk and tone Derm: Intact, no rash Neuro: awake on  precedex, followed commands, no focal deficits.   LABS:  CBC  Recent Labs Lab 12/18/14 0345 12/19/14 0422 12/20/14 0430  WBC 17.6* 15.4* 15.2*  HGB 7.4* 7.4* 7.1*  HCT 22.9* 24.1* 23.9*  PLT 496* 514* 507*   Coag's No results for input(s): APTT, INR in the last 168 hours. BMET  Recent Labs Lab 12/19/14 0320 12/20/14 0430 12/21/14 0625  NA 145 145 149*  K 4.2 3.5 3.6  CL 106 106 106  CO2 31 31 30   BUN 40* 39* 24*  CREATININE 0.82 0.85 0.85  GLUCOSE 227* 223* 126*   Electrolytes  Recent Labs Lab 12/18/14 0345 12/19/14 0320 12/20/14 0430 12/21/14 0625  CALCIUM 9.3 9.2 9.3 8.9  MG 2.0 2.0 2.1  --   PHOS 3.6 3.5 2.7 3.4   Sepsis Markers  Recent Labs Lab 12/15/14 0330 12/16/14 1636  PROCALCITON 3.46 1.37   ABG  Recent Labs Lab 12/14/14 1220 12/15/14 1242 12/18/14 1303  PHART 7.395 7.469* 7.462*  PCO2ART 40.2 36.9 42.2  PO2ART 77.9* 58.0* 74.0*   Liver Enzymes No results for input(s): AST, ALT, ALKPHOS, BILITOT, ALBUMIN in the last 168 hours. Cardiac Enzymes No results for input(s): TROPONINI, PROBNP in the last 168 hours. Glucose  Recent Labs Lab 12/20/14 1211 12/20/14 1529 12/20/14 1950 12/20/14 2334 12/21/14 0416 12/21/14 0822  GLUCAP 107* 141* 127* 84 118* 123*    Imaging Dg Chest Port 1 View  12/21/2014   CLINICAL DATA:  Acute respiratory failure  EXAM: PORTABLE CHEST - 1 VIEW  COMPARISON:  12/20/2014  FINDINGS: Endotracheal tube and NG tube removed. Right jugular catheter tip in the right atrium unchanged. No pneumothorax  Progression of bibasilar atelectasis left greater than right. Small left effusion. Mild vascular congestion without edema.  IMPRESSION: Progression of bibasilar atelectasis left greater than right following extubation. Central venous catheter tip in the right atrium unchanged.   Electronically Signed   By: Franchot Gallo M.D.   On: 12/21/2014 07:31   Dg Chest Port 1 View  12/20/2014   CLINICAL DATA:  74 year old  female with acute respiratory failure. Subsequent encounter.  EXAM: PORTABLE CHEST - 1 VIEW  COMPARISON:  12/19/2014.  FINDINGS: Rotation to the right. Endotracheal tube tip 3.5 cm above the carina. Right central line tip caval atrial junction. Nasogastric tube courses below the diaphragm. Tip is not included on the present exam.  Slightly improved asymmetric airspace disease now more notable on left may represent resolving pulmonary edema or infiltrate.  Cardiomegaly.  No gross pneumothorax.  Elevated right hemidiaphragm unchanged.  IMPRESSION: Slight improved aeration right lung. Residual asymmetric airspace disease greater on left may represent resolving pulmonary edema or infiltrate.  Rotated exam with cardiomegaly.   Electronically Signed   By: Genia Del M.D.   On: 12/20/2014 07:50     ASSESSMENT / PLAN: PULMONARY OETT 8/20 >> 8/30 A: Severe CAP Acute hypoxic respiratory failure in setting of CAP ARDS P:   Aggressive pulm hygiene May intermittently needs some deep suctioning VAP prevention bundle Hold lasix with rising Na. Note I/O measurements more difficult now that foley out Monitor and replete K while on diuresis   CARDIOVASCULAR CVL R IJ 8/25 >>  A:  Severe sepsis without shock, P:  Continue Tele Home Norvasc and Altace on hold, may need to restart once sedation off completely  Hold diuresis while minimal oral intake, as above  RENAL A:   Mild increase in Renal function, likely related to sepsis, improved P:   Monitor BMET and UOP Replace electrolytes (K) as needed.  GASTROINTESTINAL A:   Nutrition  P:   Appreciate SLP assistance, start diet when safe  HEMATOLOGIC A:   Normocytic anemia without bleeding P:  Monitor for bleeding Transfusion threshold for Hgb < 7gm/dL  INFECTIOUS A:   Severe CAP Severe Sepsis  Ustrep >> neg  P:   BCx2 8/19>>>neg UC 8/19>>>neg u legionella 8/19>>> Sputum 8/19>>>neg  azithro 8/19>>>off Rocephin 8/19>>> (increase  dose to 2gm 8/21)>> 8/27 vanc 8/20>>> 8/25  All cultures remain negative. C.Diff test indicates colonization. Looks like she needs to be on C diff isolation with this lab result ??   ENDOCRINE A:   DM P:   Glucose is trending downwards with lantus addition, 8/28 increased lantus to 12 units SSI at moderate dose.   NEUROLOGIC A:   Acute encephalopathy(improving 8/28) Intermittent delirium Vent dyssynchrony Hx schizophrenia P:   RASS goal: 0 Fentanyl prn. Precedex drip off Would like to restart her home psych regimen as soon as she is able to take PO Supportive care   FAMILY  - Updates: Updated children 8/30 at bedside - Inter-disciplinary family meet or Palliative Care meeting due by:  8/27; need to arrange this week  Will write orders for SDU, plan transfer to [facility] as of 12/22/14  Baltazar Apo, MD, PhD 12/21/2014, 9:33 AM  Pulmonary and Critical Care [phone] or if no answer [phone]

## 2014-12-21 NOTE — Progress Notes (Signed)
Occupational Therapy Evaluation Patient Details Name: [name] MRN: [id number] DOB: 09/01/1940 Today's Date: 12/21/2014    History of Present Illness [name] is a 74 year-old lady with history of CAD, hypertension, type II diabetes, hyperlipidemia, and schizophrenia who presented with weakness, cough, fever, and diarrhea. Chest x-ray consistent with CAP. Decline in medical status. Transferred to ICU. Intubated 8/20. Extubated 8/30.    Clinical Impression   PTA, pt lived alone in apt and had PCA 3 hrs/day and family in [place] to assist. HR increased to 138 sitting EOB. Maintained tachy while sitting EOB @ 128.  Pt with significant decline in functional status and will benefit from rehab at SNF to facilitate return to PLOF. Will follow acutely to address established goals.     Follow Up Recommendations  SNF;Supervision/Assistance - 24 hour    Equipment Recommendations  None recommended by OT    Recommendations for Other Services       Precautions / Restrictions Precautions Precautions: Fall;Other (comment) (watch HR)      Mobility Bed Mobility Overal bed mobility: +2 for physical assistance;Needs Assistance Bed Mobility: Supine to Sit;Sit to Supine     Supine to sit: +2 for physical assistance;Mod assist Sit to supine: Mod assist;+2 for physical assistance   General bed mobility comments: Pt assisting with bed mobility. delay in initiation  Transfers Overall transfer level: Needs assistance Equipment used: 2 person hand held assist Transfers: Sit to/from Stand Sit to Stand: Mod assist;+2 physical assistance         General transfer comment: Pt initiates transitional movment    Balance Overall balance assessment: Needs assistance   Sitting balance-Leahy Scale: Poor Sitting balance - Comments: initially pt with significant posterior lean. at end of session. Pt able to maintain upright sitting EOB with S Postural control: Posterior lean   Standing  balance-Leahy Scale: Poor Standing balance comment: posterior bias                            ADL Overall ADL's : Needs assistance/impaired Eating/Feeding: Maximal assistance Eating/Feeding Details (indicate cue type and reason): tremors increased apparently from being off psych meds making it difficult to feed self. honey thick liquids and puree Grooming: Maximal assistance   Upper Body Bathing: Maximal assistance;Sitting   Lower Body Bathing: Maximal assistance;Sit to/from stand   Upper Body Dressing : Maximal assistance;Sitting   Lower Body Dressing: Maximal assistance;Sit to/from stand   Toilet Transfer: +2 for physical assistance;Moderate assistance   Toileting- Clothing Manipulation and Hygiene: Maximal assistance Toileting - Clothing Manipulation Details (indicate cue type and reason): incontinent of urine. nsg changing pt     Functional mobility during ADLs: +2 for physical assistance;Moderate assistance General ADL Comments: Pt with decline in function.     Vision     Perception     Praxis      Pertinent Vitals/Pain Pain Assessment: No/denies pain HR 121-138. Increased with activity. O2 Sats stable 4 L.     Hand Dominance     Extremity/Trunk Assessment Upper Extremity Assessment Upper Extremity Assessment: Generalized weakness   Lower Extremity Assessment Lower Extremity Assessment: Generalized weakness   Cervical / Trunk Assessment Cervical / Trunk Assessment: Normal   Communication Communication Communication: Expressive difficulties   Cognition Arousal/Alertness: Awake/alert Behavior During Therapy: Flat affect;Anxious Overall Cognitive Status: No family/caregiver present to determine baseline cognitive functioning  General Comments       Exercises       Shoulder Instructions      Home Living Family/patient expects to be discharged to:: Skilled nursing facility                                         Prior Functioning/Environment Level of Independence: Needs assistance  Gait / Transfers Assistance Needed: Pt used cane at times and RW at times. Unsure. ADL's / Homemaking Assistance Needed: CNA 3 hrs/day assisted with self care and IADL needs        OT Diagnosis: Generalized weakness;Cognitive deficits   OT Problem List: Decreased strength;Decreased range of motion;Decreased activity tolerance;Impaired balance (sitting and/or standing);Decreased coordination;Decreased cognition;Decreased safety awareness;Decreased knowledge of use of DME or AE;Cardiopulmonary status limiting activity;Obesity   OT Treatment/Interventions: Self-care/ADL training;Therapeutic exercise;Energy conservation;DME and/or AE instruction;Therapeutic activities;Cognitive remediation/compensation;Patient/family education;Balance training    OT Goals(Current goals can be found in the care plan section) Acute Rehab OT Goals Patient Stated Goal: to go home OT Goal Formulation: Patient unable to participate in goal setting Time For Goal Achievement: 01/04/15 Potential to Achieve Goals: Good  OT Frequency: Min 2X/week   Barriers to D/C:            Co-evaluation PT/OT/SLP Co-Evaluation/Treatment: Yes Reason for Co-Treatment: Complexity of the patient's impairments (multi-system involvement);Necessary to address cognition/behavior during functional activity;For patient/therapist safety   OT goals addressed during session: ADL's and self-care;Strengthening/ROM      End of Session Equipment Utilized During Treatment: Gait belt;Oxygen (4L) Nurse Communication: Mobility status;Other (comment) (HR)  Activity Tolerance: Patient limited by fatigue Patient left: in bed;with call bell/phone within reach;with bed alarm set   Time: 1610-9604 OT Time Calculation (min): 35 min Charges:  OT General Charges $OT Visit: 1 Procedure OT Evaluation $Initial OT Evaluation Tier I: 1 Procedure G-Codes:     [place] Scheiderer,HILLARY 2015/01/18, 4:55 PM   [facility], OTR/L  [phone] 01/18/2015

## 2014-12-22 LAB — BASIC METABOLIC PANEL
Anion gap: 9 (ref 5–15)
BUN: 14 mg/dL (ref 6–20)
CHLORIDE: 112 mmol/L — AB (ref 101–111)
CO2: 30 mmol/L (ref 22–32)
CREATININE: 0.76 mg/dL (ref 0.44–1.00)
Calcium: 9.3 mg/dL (ref 8.9–10.3)
GFR calc Af Amer: >60 mL/min (ref >60)
GFR calc non Af Amer: >60 mL/min (ref >60)
Glucose, Bld: 127 mg/dL — ABNORMAL HIGH (ref 65–99)
POTASSIUM: 3.3 mmol/L — AB (ref 3.5–5.1)
SODIUM: 151 mmol/L — AB (ref 135–145)

## 2014-12-22 LAB — GLUCOSE, CAPILLARY
GLUCOSE-CAPILLARY: 143 mg/dL — AB (ref 65–99)
GLUCOSE-CAPILLARY: 138 mg/dL — AB (ref 65–99)
GLUCOSE-CAPILLARY: 157 mg/dL — AB (ref 65–99)
GLUCOSE-CAPILLARY: 156 mg/dL — AB (ref 65–99)
Glucose-Capillary: 75 mg/dL (ref 65–99)
Glucose-Capillary: 148 mg/dL — ABNORMAL HIGH (ref 65–99)

## 2014-12-22 LAB — FOLATE: Folate: 23.7 ng/mL (ref >5.9)

## 2014-12-22 MED ORDER — INFLUENZA VAC SPLIT QUAD 0.5 ML IM SUSY
0.5000 mL | PREFILLED_SYRINGE | INTRAMUSCULAR | Status: DC
  Filled 2014-12-22: qty 0.5

## 2014-12-22 MED ORDER — PANTOPRAZOLE SODIUM 40 MG PO TBEC
40.0000 mg | DELAYED_RELEASE_TABLET | Freq: Every day | ORAL | Status: DC
  Administered 2014-12-22 – 2014-12-26 (×5): 40 mg via ORAL
  Filled 2014-12-22 (×5): qty 1

## 2014-12-22 MED ORDER — CLONAZEPAM 0.5 MG PO TABS
0.2500 mg | ORAL_TABLET | Freq: Every day | ORAL | Status: DC
  Administered 2014-12-22 – 2014-12-25 (×4): 0.25 mg via ORAL
  Filled 2014-12-22 (×4): qty 1

## 2014-12-22 MED ORDER — ASPIRIN 325 MG PO TABS
325.0000 mg | ORAL_TABLET | Freq: Every day | ORAL | Status: DC
  Administered 2014-12-23: 325 mg via ORAL
  Filled 2014-12-22: qty 1

## 2014-12-22 MED ORDER — STARCH (THICKENING) PO POWD: ORAL | Status: DC | PRN

## 2014-12-22 MED ORDER — RESOURCE THICKENUP CLEAR PO POWD
ORAL | Status: DC | PRN
  Filled 2014-12-22: qty 125

## 2014-12-22 MED ORDER — AMLODIPINE BESYLATE 10 MG PO TABS
10.0000 mg | ORAL_TABLET | Freq: Every day | ORAL | Status: DC
  Administered 2014-12-22 – 2014-12-27 (×6): 10 mg via ORAL
  Filled 2014-12-22 (×6): qty 1

## 2014-12-22 NOTE — Progress Notes (Signed)
PULMONARY / CRITICAL CARE MEDICINE   Name: [name] MRN: [id number] DOB: 27-Jun-1940    ADMISSION DATE:  12/09/2014 CONSULTATION DATE:  8/20  REFERRING MD :  Evette Doffing   CHIEF COMPLAINT:  Hypoxic respiratory failure   INITIAL PRESENTATION:  74 year old female w sig h/o HTN, DM, schizophrenia. Admitted to IMTS on 8/19 w/ working dx of CAP. Developed progressive airspace disease on CXR and worsening hypoxia on 8/20. PCCM called and pt transferred to the ICU for emergent intubation.   STUDIES:  Head CT scan (8/20) No acute intracranial abnormality Echo (8/23) - Normal LV function; grade 1 diastolic dysfunction with elevated LV filling pressure; moderate MR; trace TR with mildly elevated pulmonary pressure.  SIGNIFICANT EVENTS: 8/20 intubated for respiratory failure. 8/23. Has issues with vent dysynchrony. Vent adjusted with increased rate. Still has poor urine output. Given albumin and fluid bolus. 8/25. Central line placed for access.  SUBJECTIVE:  Cleared for a PO diet, dysphagia 1 with honey thick liquids   VITAL SIGNS: Temp:  [98.4 F (36.9 C)-99.7 F (37.6 C)] 98.4 F (36.9 C) (09/01 0738) Pulse Rate:  [101-118] 106 (09/01 0700) Resp:  [17-30] 27 (09/01 0700) BP: (148-190)/(58-120) 154/65 mmHg (09/01 0700) SpO2:  [90 %-100 %] 98 % (09/01 0700) Weight:  [79.2 kg (174 lb 9.7 oz)] 79.2 kg (174 lb 9.7 oz) (09/01 0500) HEMODYNAMICS:   VENTILATOR SETTINGS:   INTAKE / OUTPUT:  Intake/Output Summary (Last 24 hours) at 12/22/14 0851 Last data filed at 12/21/14 1200  Gross per 24 hour  Intake      0 ml  Output    300 ml  Net   -300 ml    PHYSICAL EXAMINATION: General:  Awake and alert,  HENT: NCAT, PERRL, OP clear, poor dentition PULM: bibasilar decreased BS and insp crackles, no wheezes CV: RRR, no mgr, 1+ LE edema. GI: BS+, soft nontender MSK: no lesions Derm: Intact, no rash Neuro: awake, answers questions, oriented to self but not place    LABS:  CBC  Recent Labs Lab 12/18/14 0345 12/19/14 0422 12/20/14 0430  WBC 17.6* 15.4* 15.2*  HGB 7.4* 7.4* 7.1*  HCT 22.9* 24.1* 23.9*  PLT 496* 514* 507*   Coag's No results for input(s): APTT, INR in the last 168 hours. BMET  Recent Labs Lab 12/19/14 0320 12/20/14 0430 12/21/14 0625  NA 145 145 149*  K 4.2 3.5 3.6  CL 106 106 106  CO2 31 31 30   BUN 40* 39* 24*  CREATININE 0.82 0.85 0.85  GLUCOSE 227* 223* 126*   Electrolytes  Recent Labs Lab 12/18/14 0345 12/19/14 0320 12/20/14 0430 12/21/14 0625  CALCIUM 9.3 9.2 9.3 8.9  MG 2.0 2.0 2.1  --   PHOS 3.6 3.5 2.7 3.4   Sepsis Markers  Recent Labs Lab 12/16/14 1636  PROCALCITON 1.37   ABG  Recent Labs Lab 12/15/14 1242 12/18/14 1303  PHART 7.469* 7.462*  PCO2ART 36.9 42.2  PO2ART 58.0* 74.0*   Liver Enzymes No results for input(s): AST, ALT, ALKPHOS, BILITOT, ALBUMIN in the last 168 hours. Cardiac Enzymes No results for input(s): TROPONINI, PROBNP in the last 168 hours. Glucose  Recent Labs Lab 12/21/14 1128 12/21/14 1545 12/21/14 1931 12/21/14 2316 12/22/14 0508 12/22/14 0733  GLUCAP 103* 162* 189* 143* 138* 75    Imaging Dg Chest Port 1 View  12/21/2014   CLINICAL DATA:  Acute respiratory failure  EXAM: PORTABLE CHEST - 1 VIEW  COMPARISON:  12/20/2014  FINDINGS: Endotracheal tube and NG  tube removed. Right jugular catheter tip in the right atrium unchanged. No pneumothorax  Progression of bibasilar atelectasis left greater than right. Small left effusion. Mild vascular congestion without edema.  IMPRESSION: Progression of bibasilar atelectasis left greater than right following extubation. Central venous catheter tip in the right atrium unchanged.   Electronically Signed   By: Franchot Gallo M.D.   On: 12/21/2014 07:31   Dg Swallowing Func-speech Pathology  12/21/2014    Objective Swallowing Evaluation:    Patient Details  Name: [name] MRN: [id number] Date of Birth: [birth year]   Today's Date: 12/21/2014 Time: SLP Start Time (ACUTE ONLY): 1315-SLP Stop Time (ACUTE ONLY): 1345 SLP Time Calculation (min) (ACUTE ONLY): 30 min  Past Medical History:  Past Medical History  Diagnosis Date  . Anemia, iron deficiency   . Anxiety   . CAD (coronary artery disease)   . Type II or unspecified type diabetes mellitus without mention of  complication, not stated as uncontrolled   . GERD (gastroesophageal reflux disease)   . HTN (hypertension)   . Peripheral neuropathy   . Schizophrenia   . Hyperlipidemia   . Allergic rhinitis   . 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 Dementia   . IBS (irritable bowel syndrome)   . Syncope     EP study 1996 w/atrial tachycardia  . Esophageal motility disorder   . Renal insufficiency    Past Surgical History:  Past Surgical History  Procedure Laterality Date  . Abdominal hysterectomy    . Ptca  1995  . Knee arthroplasty      Right   . Breast biopsy      Negative   HPI:  Other Pertinent Information: 74 year old female with h/o HTN, DM,  schizophrenia, admitted 8/19 w/ working dx of CAP. Developed progressive  airspace disease on CXR and worsening hypoxia on 8/20. Transferred to the  ICU for emergent intubation. Extubated 8/30 am.   No Data Recorded  Assessment / Plan / Recommendation CHL IP CLINICAL IMPRESSIONS 12/21/2014  Therapy Diagnosis Mild oral phase dysphagia;Mild pharyngeal phase  dysphagia  Clinical Impression Pt presents with a mild-mod oropharyngeal dysphagia  marked by mild lingual discoordination, mild delay in swallow initiation,  and immediate penetration of nectar-thick liquids - reaches the level of  the vocal folds and subglottal space.  No cough response was elicited.   Suspect inadequate laryngeal closure as source of deficit.  Honey-thick  liquids and purees were swallowed with adequate pharyngeal clearance and  no penetration of airway.  Recommend initiating a dysphagia 1 diet with  honey-thick liquids initially; provide  full supervision given MS deficits;  meds whole in puree.  SLP will follow for safety and diet advancement.       CHL IP TREATMENT RECOMMENDATION 12/21/2014  Treatment Recommendations Therapy as outlined in treatment plan below     CHL IP DIET RECOMMENDATION 12/21/2014  SLP Diet Recommendations Dysphagia 1 (Puree)  Liquid Administration via (None)  Medication Administration Whole meds with puree  Compensations Minimize environmental distractions;Slow rate;Small  sips/bites  Postural Changes and/or Swallow Maneuvers (None)     CHL IP OTHER RECOMMENDATIONS 12/21/2014  Recommended Consults (None)  Oral Care Recommendations Oral care BID  Other Recommendations Order thickener from pharmacy     No flowsheet data found.   CHL IP FREQUENCY AND DURATION 12/21/2014  Speech Therapy Frequency (ACUTE ONLY) min 3x week  Treatment Duration 2 weeks  SLP Swallow Goals No flowsheet data found.  No flowsheet data found.    CHL IP REASON FOR REFERRAL 12/21/2014  Reason for Referral Objectively evaluate swallowing function     CHL IP ORAL PHASE 12/21/2014  Lips (None)  Tongue (None)  Mucous membranes (None)  Nutritional status (None)  Other (None)  Oxygen therapy (None)  Oral Phase Impaired  Oral - Pudding Teaspoon (None)  Oral - Pudding Cup (None)  Oral - Honey Teaspoon (None)  Oral - Honey Cup (None)  Oral - Honey Syringe (None)  Oral - Nectar Teaspoon (None)  Oral - Nectar Cup (None)  Oral - Nectar Straw (None)  Oral - Nectar Syringe (None)  Oral - Ice Chips (None)  Oral - Thin Teaspoon (None)  Oral - Thin Cup (None)  Oral - Thin Straw (None)  Oral - Thin Syringe (None)  Oral - Puree (None)  Oral - Mechanical Soft (None)  Oral - Regular (None)  Oral - Multi-consistency (None)  Oral - Pill (None)  Oral Phase - Comment (None)      CHL IP PHARYNGEAL PHASE 12/21/2014  Pharyngeal Phase Impaired  Pharyngeal - Pudding Teaspoon (None)  Penetration/Aspiration details (pudding teaspoon) (None)  Pharyngeal - Pudding Cup (None)   Penetration/Aspiration details (pudding cup) (None)  Pharyngeal - Honey Teaspoon (None)  Penetration/Aspiration details (honey teaspoon) (None)  Pharyngeal - Honey Cup (None)  Penetration/Aspiration details (honey cup) (None)  Pharyngeal - Honey Syringe (None)  Penetration/Aspiration details (honey syringe) (None)  Pharyngeal - Nectar Teaspoon (None)  Penetration/Aspiration details (nectar teaspoon) (None)  Pharyngeal - Nectar Cup (None) Penetration/Aspiration details (nectar cup)  (None)  Pharyngeal - Nectar Straw (None)  Penetration/Aspiration details (nectar straw) (None)  Pharyngeal - Nectar Syringe (None)  Penetration/Aspiration details (nectar syringe) (None)  Pharyngeal - Ice Chips (None)  Penetration/Aspiration details (ice chips) (None)  Pharyngeal - Thin Teaspoon (None)  Penetration/Aspiration details (thin teaspoon) (None)  Pharyngeal - Thin Cup (None)  Penetration/Aspiration details (thin cup) (None)  Pharyngeal - Thin Straw (None)  Penetration/Aspiration details (thin straw) (None)  Pharyngeal - Thin Syringe (None)  Penetration/Aspiration details (thin syringe') (None)  Pharyngeal - Puree (None)  Penetration/Aspiration details (puree) (None)  Pharyngeal - Mechanical Soft (None)  Penetration/Aspiration details (mechanical soft) (None)  Pharyngeal - Regular (None)  Penetration/Aspiration details (regular) (None)  Pharyngeal - Multi-consistency (None)  Penetration/Aspiration details (multi-consistency) (None)  Pharyngeal - Pill (None)  Penetration/Aspiration details (pill) (None)  Pharyngeal Comment (None)      CHL IP CERVICAL ESOPHAGEAL PHASE 12/21/2014  Cervical Esophageal Phase (No Data)  Pudding Teaspoon (None)  Pudding Cup (None)  Honey Teaspoon (None)  Honey Cup (None)  Honey Straw (None)  Nectar Teaspoon (None)  Nectar Cup (None)  Nectar Straw (None)  Nectar Sippy Cup (None)  Thin Teaspoon (None)  Thin Cup (None)  Thin Straw (None)  Thin Sippy Cup (None)  Cervical Esophageal Comment (None)    No  flowsheet data found.         Juan Quam Laurice 12/21/2014, 2:10 PM Estill Bamberg L. [place], [place] CCC/SLP Pager [phone]      ASSESSMENT / PLAN: PULMONARY OETT 8/20 >> 8/30 A: Severe CAP Acute hypoxic respiratory failure in setting of CAP ARDS P:   Aggressive pulm hygiene May intermittently need some deep suctioning, but airway protection appears to be improving VAP prevention bundle Holding lasix with rising Na. Note I/O measurements more difficult now that foley out Monitor and replete K while on diuresis   CARDIOVASCULAR CVL R IJ 8/25 >> 8/31  A:  Severe sepsis without shock, P:  Continue Tele Restart home Norvasc 9/1, probably restart ACE-i, statin soon Hold diuresis while minimal oral intake  RENAL A:   Mild increase in Renal function, likely related to sepsis, improved Hypernatremia  P:   Monitor BMET and UOP, recheck BMP this am Replace electrolytes (K) as needed.  GASTROINTESTINAL A:   Nutrition  P:   Appreciate SLP assistance, on Dys1 diet  HEMATOLOGIC A:   Normocytic anemia without bleeding P:  Monitor for bleeding Transfusion threshold for Hgb < 7gm/dL  INFECTIOUS A:   Severe CAP Severe Sepsis  Ustrep >> neg  P:   BCx2 8/19>>>neg UC 8/19>>>neg u legionella 8/19>>> neg Sputum 8/19>>>neg  azithro 8/19>>>off Rocephin 8/19>>> (increase dose to 2gm 8/21)>> 8/27 vanc 8/20>>> 8/25  All cultures remain negative. CAP treated  C.Diff test indicates colonization. Looks like she needs to be on C diff isolation with this lab result ??   ENDOCRINE A:   DM P:   lantus ordered Home meds on hold, restart as PO intake improves SSI at moderate dose.   NEUROLOGIC A:   Acute encephalopathy, improved Intermittent delirium Hx dementia Hx schizophrenia P:   RASS goal: 0 Fentanyl prn > change to PO prn meds soon Precedex drip off Have restarted her dementia /  psych regimen 8/30 Supportive care   FAMILY  - Updates: Updated children 8/30 at  bedside  Transferring to SDU 9/1, will ask IMTS to reassume her care   Baltazar Apo, MD, PhD 12/22/2014, 8:51 AM  Pulmonary and Critical Care [phone] or if no answer [phone]

## 2014-12-22 NOTE — Clinical Social Work Placement (Signed)
   CLINICAL SOCIAL WORK PLACEMENT  NOTE  Date:  12/22/2014  Patient Details  Name: [name] MRN: [id number] Date of Birth: 03/23/1941  Clinical Social Work is seeking post-discharge placement for this patient at the [place] level of care (*CSW will initial, date and re-position this form in  chart as items are completed):  Yes   Patient/family provided with [facility] Work Department's list of facilities offering this level of care within the geographic area requested by the patient (or if unable, by the patient's family).  Yes   Patient/family informed of their freedom to choose among providers that offer the needed level of care, that participate in Medicare, Medicaid or managed care program needed by the patient, have an available bed and are willing to accept the patient.  Yes   Patient/family informed of [facility]'s ownership interest in [facility] and [facility], as well as of the fact that they are under no obligation to receive care at these facilities.  PASRR submitted to EDS on       PASRR number received on       Existing PASRR number confirmed on 12/22/14     FL2 transmitted to all facilities in geographic area requested by pt/family on 12/22/14     FL2 transmitted to all facilities within larger geographic area on       Patient informed that his/her managed care company has contracts with or will negotiate with certain facilities, including the following:            Patient/family informed of bed offers received.  Patient chooses bed at       Physician recommends and patient chooses bed at      Patient to be transferred to   on  .  Patient to be transferred to facility by       Patient family notified on   of transfer.  Name of family member notified:        PHYSICIAN Please sign FL2     Additional Comment:    _______________________________________________ Cranford Mon, LCSW 12/22/2014, 2:51 PM

## 2014-12-22 NOTE — Progress Notes (Signed)
IMTS called by Dr. Lamonte Sakai to take over care as primary team.  Will take over care 9/2 at [facility], attending will be Dr Eppie Gibson.  [employer].  Lucious Groves, DO

## 2014-12-22 NOTE — Clinical Social Work Note (Signed)
Clinical Social Work Assessment  Patient Details  Name: [name] MRN: [id number] Date of Birth: 05/18/1940  Date of referral:  12/22/14               Reason for consult:  Facility Placement                Permission sought to share information with:  Facility Sport and exercise psychologist, Family Supports Permission granted to share information::  No (pt disoriented)  Name::     [name], [name]  Agency::  [facility]  Relationship::  daughter, son  Contact Information:     Housing/Transportation Living arrangements for the past 2 months:  Apartment Source of Information:  Adult Children Patient Interpreter Needed:  None Criminal Activity/Legal Involvement Pertinent to Current Situation/Hospitalization:  No - Comment as needed Significant Relationships:  Adult Children Lives with:  Self Do you feel safe going back to the place where you live?  No Need for family participation in patient care:  Yes (Comment)  Care giving concerns:  Pt lives in a senior living community (ALF?) independently- pt family reports that pt is followed by [facility] in the community and that they help provide pt with appropriate care at home (RN visits, food, cleaning etc).  Pt family does not think pt has enough support at hoem to return given current levels of impairment   Facilities manager / plan:  CSW discussed PT recommendation for SNF with pt son and two daughters at bedside  Employment status:  Retired Insurance underwriter information:  Medicare PT Recommendations:  [name] / Referral to community resources:  [employer]  Patient/Family's Response to care:  Patient family is agreeable to SNF placement with goal of increasing strength so patient can return to independent living.  Pt family states pt went to [name] in the past and that they were happy with the care the pt received there- hopeful for pt ability to return to [name] for  rehab  Patient/Family's Understanding of and Emotional Response to Diagnosis, Current Treatment, and Prognosis: pt family does not have questions or concerns at this time  Emotional Assessment Appearance:  Appears stated age Attitude/Demeanor/Rapport:  Unable to Assess Affect (typically observed):  Unable to Assess Orientation:  Oriented to Self, Oriented to Place Alcohol / Substance use:  Not Applicable Psych involvement (Current and /or in the community):  No (Comment)  Discharge Needs  Concerns to be addressed:  Care Coordination Readmission within the last 30 days:  No Current discharge risk:  Physical Impairment Barriers to Discharge:  Continued Medical Work up   Jane Mon, LCSW 12/22/2014, 2:47 PM

## 2014-12-22 NOTE — Progress Notes (Signed)
Occupational Therapy Treatment Patient Details Name: [name] MRN: [id number] DOB: 1941-01-04 Today's Date: 12/22/2014    History of present illness  74 year-old lady with history of CAD, ypertension, type II diabetes, hyperlipidemia, and schizophrenia who presented with weakness, cough, fever, and diarrhea. Chest x-ray consistent with CAP. Intubated 8/20. Extubated 8/30.    OT comments  Addressed self feeding this session with use of AE. Also completed toilet transfer requiring Max A +2. Continue to recommend SNF for rehab. Will follow.   Follow Up Recommendations  SNF;Supervision/Assistance - 24 hour    Equipment Recommendations  None recommended by OT    Recommendations for Other Services      Precautions / Restrictions Precautions Precautions: Fall       Mobility Bed Mobility Overal bed mobility: Needs Assistance Bed Mobility: Supine to Sit;Sit to Supine     Supine to sit: Mod assist Sit to supine: Max assist      Transfers Overall transfer level: Needs assistance Equipment used: 2 person hand held assist Transfers: Sit to/from [employer] Sit to Stand: Max assist;+2 physical assistance   Squat pivot transfers: +2 physical assistance;Max assist     General transfer comment: able to scoot bottom onto bed    Balance     Sitting balance-Leahy Scale: Fair       Standing balance-Leahy Scale: Poor                     ADL   Eating/Feeding: Minimal assistance;Sitting Eating/Feeding Details (indicate cue type and reason): sat EOB to feed self. Used weighted built up spoon andnosey cuup with increased success. Pt fed self % 75% of meal. Pt able to self monitor speed . Very slow.                     Toilet Transfer: +2 for physical assistance;Maximal assistance (increased assistance today)   Toileting- Clothing Manipulation and Hygiene: Maximal assistance       Functional mobility during ADLs: Maximal assistance;+2 for  physical assistance General ADL Comments: Increased assistance with sit - stand today. Pt unable to achieve upright posture. Standing flexed at hips requiring Max A to squat pivot to chair. Pt will need to use lift for mobility.      Vision                     Perception     Praxis      Cognition   Behavior During Therapy: Flat affect;Anxious Overall Cognitive Status: No family/caregiver present to determine baseline cognitive functioning                       Extremity/Trunk Assessment               Exercises     Shoulder Instructions       General Comments      Pertinent Vitals/ Pain       Pain Assessment: No/denies pain  Home Living                                          Prior Functioning/Environment              Frequency Min 2X/week     Progress Toward Goals  OT Goals(current goals can now be found in the care plan section)  Progress towards  OT goals: Progressing toward goals  Acute Rehab OT Goals Patient Stated Goal: to go home OT Goal Formulation: Patient unable to participate in goal setting Time For Goal Achievement: 01/04/15 Potential to Achieve Goals: Good ADL Goals Pt Will Perform Eating: with set-up;with supervision;sitting;with adaptive utensils Pt Will Perform Grooming: with set-up;with supervision;sitting Pt Will Perform Upper Body Bathing: with set-up;with supervision;sitting Pt Will Transfer to Toilet: with min assist;stand pivot transfer;bedside commode  Plan Discharge plan remains appropriate    Co-evaluation                 End of Session Equipment Utilized During Treatment: Gait belt;Oxygen   Activity Tolerance Patient tolerated treatment well   Patient Left in bed;with call bell/phone within reach;with bed alarm set   Nurse Communication Mobility status;Need for lift equipment (maximove)        Time: 1003-4961 OT Time Calculation (min): 35 min  Charges: OT General  Charges $OT Visit: 1 Procedure OT Treatments $Self Care/Home Management : 23-37 mins  Kenlea Woodell,HILLARY 12/22/2014, 4:48 PM   [facility], OTR/L  [phone] 12/22/2014

## 2014-12-22 NOTE — Progress Notes (Signed)
Speech Language Pathology Treatment: Dysphagia  Patient Details Name: [name] MRN: [id number] DOB: 16-Jul-1940 Today's Date: 12/22/2014 Time: 5300-5110 SLP Time Calculation (min) (ACUTE ONLY): 23 min  Assessment / Plan / Recommendation Clinical Impression  Assistance provided with am meal. Patient with mild lethargy this am but agreeable to am meal. Skilled observation complete with intake of honey thick liquids and pureed solids. Overall, patient with efficient oral transit and what appeared to be intact airway protection with these consistencies. SLP provided full assistance with feeding due to general upper extremity weakness, providing liquids via controlled single sips and solids via small, tsp size boluses.Current diet remains appropriate. Will continue to f/u.    HPI Other Pertinent Information: 74 year old female with h/o HTN, DM, schizophrenia, admitted 8/19 w/ working dx of CAP. Developed progressive airspace disease on CXR and worsening hypoxia on 8/20. Transferred to the ICU for emergent intubation. Extubated 8/30 am.    Pertinent Vitals Pain Assessment: No/denies pain  SLP Plan  Continue with current plan of care    Recommendations Diet recommendations: Dysphagia 1 (puree);Honey-thick liquid Liquids provided via: Cup;No straw Medication Administration: Whole meds with puree Supervision: Full supervision/cueing for compensatory strategies;Staff to assist with self feeding Compensations: Minimize environmental distractions;Slow rate;Small sips/bites Postural Changes and/or Swallow Maneuvers: Seated upright 90 degrees              Oral Care Recommendations: Oral care BID Plan: Continue with current plan of care    [place], [place] [phone]   Milika Ventress Meryl 12/22/2014, 9:25 AM

## 2014-12-23 DIAGNOSIS — Z794 Long term (current) use of insulin: Secondary | ICD-10-CM

## 2014-12-23 DIAGNOSIS — E119 Type 2 diabetes mellitus without complications: Secondary | ICD-10-CM

## 2014-12-23 DIAGNOSIS — D649 Anemia, unspecified: Secondary | ICD-10-CM

## 2014-12-23 DIAGNOSIS — E876 Hypokalemia: Secondary | ICD-10-CM

## 2014-12-23 DIAGNOSIS — E870 Hyperosmolality and hypernatremia: Secondary | ICD-10-CM

## 2014-12-23 DIAGNOSIS — R6510 Systemic inflammatory response syndrome (SIRS) of non-infectious origin without acute organ dysfunction: Secondary | ICD-10-CM

## 2014-12-23 LAB — BASIC METABOLIC PANEL
ANION GAP: 11 (ref 5–15)
BUN: 11 mg/dL (ref 6–20)
CALCIUM: 9.4 mg/dL (ref 8.9–10.3)
CO2: 27 mmol/L (ref 22–32)
Chloride: 110 mmol/L (ref 101–111)
Creatinine, Ser: 0.76 mg/dL (ref 0.44–1.00)
GFR calc Af Amer: >60 mL/min (ref >60)
GLUCOSE: 214 mg/dL — AB (ref 65–99)
Potassium: 3.5 mmol/L (ref 3.5–5.1)
Sodium: 148 mmol/L — ABNORMAL HIGH (ref 135–145)

## 2014-12-23 LAB — COMPREHENSIVE METABOLIC PANEL
ALBUMIN: 2.3 g/dL — AB (ref 3.5–5.0)
ALK PHOS: 74 U/L (ref 38–126)
ALT: 47 U/L (ref 14–54)
AST: 26 U/L (ref 15–41)
Anion gap: 8 (ref 5–15)
BILIRUBIN TOTAL: 0.5 mg/dL (ref 0.3–1.2)
BUN: 13 mg/dL (ref 6–20)
CALCIUM: 9.4 mg/dL (ref 8.9–10.3)
CO2: 31 mmol/L (ref 22–32)
Chloride: 113 mmol/L — ABNORMAL HIGH (ref 101–111)
Creatinine, Ser: 0.8 mg/dL (ref 0.44–1.00)
GFR calc Af Amer: >60 mL/min (ref >60)
GLUCOSE: 178 mg/dL — AB (ref 65–99)
POTASSIUM: 3.3 mmol/L — AB (ref 3.5–5.1)
Sodium: 152 mmol/L — ABNORMAL HIGH (ref 135–145)
TOTAL PROTEIN: 6.7 g/dL (ref 6.5–8.1)

## 2014-12-23 LAB — IRON AND TIBC
Iron: 32 ug/dL (ref 28–170)
SATURATION RATIOS: 14 % (ref 10.4–31.8)
TIBC: 237 ug/dL — ABNORMAL LOW (ref 250–450)
UIBC: 205 ug/dL

## 2014-12-23 LAB — PROTIME-INR
INR: 1.24 (ref 0.00–1.49)
Prothrombin Time: 15.8 seconds — ABNORMAL HIGH (ref 11.6–15.2)

## 2014-12-23 LAB — GLUCOSE, CAPILLARY
GLUCOSE-CAPILLARY: 129 mg/dL — AB (ref 65–99)
GLUCOSE-CAPILLARY: 180 mg/dL — AB (ref 65–99)
GLUCOSE-CAPILLARY: 187 mg/dL — AB (ref 65–99)
Glucose-Capillary: 146 mg/dL — ABNORMAL HIGH (ref 65–99)
Glucose-Capillary: 160 mg/dL — ABNORMAL HIGH (ref 65–99)
Glucose-Capillary: 197 mg/dL — ABNORMAL HIGH (ref 65–99)
Glucose-Capillary: 292 mg/dL — ABNORMAL HIGH (ref 65–99)

## 2014-12-23 LAB — CBC WITH DIFFERENTIAL/PLATELET
BASOS ABS: 0 10*3/uL (ref 0.0–0.1)
Basophils Relative: 0 % (ref 0–1)
EOS PCT: 2 % (ref 0–5)
Eosinophils Absolute: 0.2 10*3/uL (ref 0.0–0.7)
HCT: 28 % — ABNORMAL LOW (ref 36.0–46.0)
Hemoglobin: 8.1 g/dL — ABNORMAL LOW (ref 12.0–15.0)
LYMPHS PCT: 22 % (ref 12–46)
Lymphs Abs: 2.4 10*3/uL (ref 0.7–4.0)
MCH: 29.7 pg (ref 26.0–34.0)
MCHC: 28.9 g/dL — ABNORMAL LOW (ref 30.0–36.0)
MCV: 102.6 fL — AB (ref 78.0–100.0)
Monocytes Absolute: 0.9 10*3/uL (ref 0.1–1.0)
Monocytes Relative: 8 % (ref 3–12)
NEUTROS PCT: 68 % (ref 43–77)
Neutro Abs: 7.4 10*3/uL (ref 1.7–7.7)
PLATELETS: 536 10*3/uL — AB (ref 150–400)
RBC: 2.73 MIL/uL — AB (ref 3.87–5.11)
RDW: 15.8 % — ABNORMAL HIGH (ref 11.5–15.5)
WBC: 10.8 10*3/uL — AB (ref 4.0–10.5)

## 2014-12-23 LAB — FERRITIN: Ferritin: 268 ng/mL (ref 11–307)

## 2014-12-23 LAB — RETICULOCYTES
RBC.: 2.73 MIL/uL — AB (ref 3.87–5.11)
RETIC COUNT ABSOLUTE: 79.2 10*3/uL (ref 19.0–186.0)
RETIC CT PCT: 2.9 % (ref 0.4–3.1)

## 2014-12-23 LAB — MAGNESIUM: MAGNESIUM: 2.2 mg/dL (ref 1.7–2.4)

## 2014-12-23 LAB — TYPE AND SCREEN
ABO/RH(D): AB POS
Antibody Screen: NEGATIVE

## 2014-12-23 LAB — TECHNOLOGIST SMEAR REVIEW

## 2014-12-23 LAB — VITAMIN B12: VITAMIN B 12: 1390 pg/mL — AB (ref 180–914)

## 2014-12-23 LAB — ABO/RH: ABO/RH(D): AB POS

## 2014-12-23 LAB — APTT: aPTT: 26 seconds (ref 24–37)

## 2014-12-23 MED ORDER — POTASSIUM CHLORIDE CRYS ER 20 MEQ PO TBCR
40.0000 meq | EXTENDED_RELEASE_TABLET | Freq: Once | ORAL | Status: AC
  Administered 2014-12-23: 40 meq via ORAL
  Filled 2014-12-23: qty 2

## 2014-12-23 MED ORDER — RAMIPRIL 10 MG PO CAPS
10.0000 mg | ORAL_CAPSULE | Freq: Every day | ORAL | Status: DC
  Administered 2014-12-23: 10 mg via ORAL
  Filled 2014-12-23 (×3): qty 1

## 2014-12-23 MED ORDER — DEXTROSE 5 % IV SOLN
INTRAVENOUS | Status: DC
  Administered 2014-12-23: 13:00:00 via INTRAVENOUS

## 2014-12-23 MED ORDER — POTASSIUM CHLORIDE 10 MEQ/100ML IV SOLN
10.0000 meq | INTRAVENOUS | Status: AC
  Administered 2014-12-23 (×3): 10 meq via INTRAVENOUS
  Filled 2014-12-23 (×3): qty 100

## 2014-12-23 NOTE — Progress Notes (Signed)
12/23/2014 patient transfer from 28m to 2c on 12/22/2014. She is alert, oriented, little bit forgetful at times. She is up with assist, because of weakness. Patient skin is fine and no breakdown noted. She was placed on telemetry when arrive on unit. [facility] RN.

## 2014-12-23 NOTE — Progress Notes (Signed)
12/23/2014  Daughter stated do not give mom flu shot , will get at [facility]. [facility] RN.

## 2014-12-23 NOTE — Progress Notes (Signed)
Speech Language Pathology Treatment: Dysphagia  Patient Details Name: [name] MRN: [id number] DOB: 10/07/1940 Today's Date: 12/23/2014 Time: 1140-1200 SLP Time Calculation (min) (ACUTE ONLY): 20 min  Assessment / Plan / Recommendation Clinical Impression  Patient's children and granddaughter present in room during session. SLP educated family during session regarding swallow safety precautions, thickening liquids. Patient tolerated cup sips of honey thick liquids and bites of applesauce, with no overt s/s aspiration or penetration. Swallow initiation was fairly timely and vocal quality remained clear after PO's administered. Patient's son asked how long the thickener will need to be used, and mentioned that the plan was for patient to discharge to [facility] on Monday. SLP informed family that we will likely get another MBSS prior to her discharge. SLP plans to schedule for the weekend or Monday.   HPI Other Pertinent Information: 74 year old female with h/o HTN, DM, schizophrenia, admitted 8/19 w/ working dx of CAP. Developed progressive airspace disease on CXR and worsening hypoxia on 8/20. Transferred to the ICU for emergent intubation. Extubated 8/30 am.    Pertinent Vitals Pain Assessment: No/denies pain Pain Score: 4  Pain Location: back Pain Descriptors / Indicators: Aching;Sore Pain Intervention(s): Limited activity within patient's tolerance;Monitored during session;Repositioned  SLP Plan  Continue with current plan of care;MBS    Recommendations Diet recommendations: Dysphagia 1 (puree);Honey-thick liquid Liquids provided via: Cup;No straw Medication Administration: Whole meds with puree Supervision: Staff to assist with self feeding;Full supervision/cueing for compensatory strategies Compensations: Minimize environmental distractions;Slow rate;Small sips/bites Postural Changes and/or Swallow Maneuvers: Seated upright 90 degrees              Oral Care  Recommendations: Oral care BID Plan: Continue with current plan of care;MBS    GO     Dannial Monarch 12/23/2014, 1:50 PM  Sonia Baller, [place], CCC-SLP 12/23/2014 1:57 PM Phone: [phone] Fax: [phone]

## 2014-12-23 NOTE — Progress Notes (Signed)
CSW offered choice- pt family chooses [facility]- facility informed  CSW will continue to follow.  Domenica Reamer, [place] Social Worker [phone]

## 2014-12-23 NOTE — Progress Notes (Signed)
Admission note:  Arrival Method: bed Mental Orientation: alert & oriented x 4  Telemetry: not ordered Assessment: completed Skin: dry; intact IV: left forearm  Pain: pt denies  Tubes: N/A 6E Orientation: Patient has been oriented to the unit, staff and to the room.    Roslyn Else [employer], RN [employer] Phone [phone]

## 2014-12-23 NOTE — Care Management Important Message (Signed)
Important Message  Patient Details  Name: [name] MRN: [id number] Date of Birth: [birth year]   Medicare Important Message Given:  Yes-fourth notification given    Girard Cooter, RN 12/23/2014, 3:19 PM

## 2014-12-23 NOTE — Progress Notes (Signed)
Subjective: Interval History: Patient was admitted on 8/19 for presumed CAP on CXR. Started on Azithromycin and Cetriaxone. Patient's condition deteriorated, added vanc. Respiratory status continued to decline, desated to 68 on NRB with declining mental function. Was intubated and transferred to ICU on 8/20. Concern for ARDS with severe sepsis. Received 8 days of cetriaxone and azithro, 5 days of vanc. Blood cultures were negative. CXR with LUL opacity on admission cleared. Patient was extubated and tolerated 4L O2 by [place]. Transferred to step down unit.  Today, patient is alert in no respiratory distress. She reports no SOB, cough, or CP. Tolerating 4L O2 via [place], decreased to 3L while in the room with no change in O2 sat. Still having some mucus production but not bringing much up. Encouraged incentive spirometry and flutter valve. Looks well enough to transfer to floor.   Objective: Vital signs in last 24 hours: Filed Vitals:   12/23/14 0400 12/23/14 0447 12/23/14 0700 12/23/14 1249  BP: 178/84   173/74  Pulse: 106   105  Temp: 98.6 F (37 C)  98.7 F (37.1 C) 97.8 F (36.6 C)  TempSrc: Oral  Oral Oral  Resp: 27   18  Height:      Weight:  163 lb 9.3 oz (74.2 kg)    SpO2: 99%   99%   Weight change: -12 lb 12.6 oz (-5.8 kg) No intake or output data in the 24 hours ending 12/23/14 1251   Physical Exam GENERAL- alert, co-operative, not in any distress. HEENT- Atraumatic, normocephalic, PERRL, EOMI, oral mucosa appears moist CARDIAC- RRR, no murmurs, rubs or gallops. RESP- Moving equal volumes of air, bibasilar crackles and decreased breath sounds, no wheezes ABDOMEN- Soft, nontender, bowel sounds present. NEURO- No obvious Cr N abnormality, Oriented to person only EXTREMITIES- pulse 2+, symmetric, no pedal edema. SKIN- Warm, dry, No rash or lesion. PSYCH- Normal mood and affect, appropriate thought content and speech.  Lab Results: Basic Metabolic Panel:  Recent Labs Lab  12/20/14 0430 12/21/14 0625 12/22/14 1050 12/23/14 0240  NA 145 149* 151* 152*  K 3.5 3.6 3.3* 3.3*  CL 106 106 112* 113*  CO2 31 30 30 31   GLUCOSE 223* 126* 127* 178*  BUN 39* 24* 14 13  CREATININE 0.85 0.85 0.76 0.80  CALCIUM 9.3 8.9 9.3 9.4  MG 2.1  --   --  2.2  PHOS 2.7 3.4  --   --    Liver Function Tests:  Recent Labs Lab 12/23/14 0240  AST 26  ALT 47  ALKPHOS 74  BILITOT 0.5  PROT 6.7  ALBUMIN 2.3*   CBC:  Recent Labs Lab 12/20/14 0430 12/23/14 0240  WBC 15.2* 10.8*  NEUTROABS  --  7.4  HGB 7.1* 8.1*  HCT 23.9* 28.0*  MCV 100.0 102.6*  PLT 507* 536*   CBG:  Recent Labs Lab 12/22/14 1239 12/22/14 1654 12/22/14 2101 12/23/14 0040 12/23/14 0435 12/23/14 0744  GLUCAP 157* 148* 156* 187* 160* 129*   Coagulation:  Recent Labs Lab 12/23/14 0240  LABPROT 15.8*  INR 1.24   Anemia Panel:  Recent Labs Lab 12/22/14 2042 12/23/14 0240  VITAMINB12  --  1390*  FOLATE 23.7  --   FERRITIN  --  268  TIBC  --  237*  IRON  --  32  RETICCTPCT  --  2.9   Urine Drug Screen: Drugs of Abuse     Component Value Date/Time   LABOPIA NONE DETECTED 12/30/2007 2051   COCAINSCRNUR NONE  DETECTED 12/30/2007 2051   LABBENZ POSITIVE* 12/30/2007 2051   AMPHETMU NONE DETECTED 12/30/2007 2051   THCU NONE DETECTED 12/30/2007 2051   LABBARB  12/30/2007 2051    NONE DETECTED        DRUG SCREEN FOR MEDICAL PURPOSES ONLY.  IF CONFIRMATION IS NEEDED FOR ANY PURPOSE, NOTIFY LAB WITHIN 5 DAYS.    Micro Results: Recent Results (from the past 240 hour(s))  C difficile quick scan w PCR reflex     Status: Abnormal   Collection Time: 12/14/14  6:30 AM  Result Value Ref Range Status   C Diff antigen POSITIVE (A) NEGATIVE Final   C Diff toxin NEGATIVE NEGATIVE Final   C Diff interpretation   Final    C. difficile present, but toxin not detected. This indicates colonization. In most cases, this does not require treatment. If patient has signs and symptoms consistent  with colitis, consider treatment.  Clostridium Difficile by PCR     Status: None   Collection Time: 12/14/14  6:30 AM  Result Value Ref Range Status   Toxigenic C Difficile by pcr NEGATIVE NEGATIVE Final   Studies/Results: Dg Swallowing Func-speech Pathology  12/21/2014    Objective Swallowing Evaluation:    Patient Details  Name: [name] MRN: [id number] Date of Birth: [birth year]  Today's Date: 12/21/2014 Time: SLP Start Time (ACUTE ONLY): 1315-SLP Stop Time (ACUTE ONLY): 1345 SLP Time Calculation (min) (ACUTE ONLY): 30 min  Past Medical History:  Past Medical History  Diagnosis Date  . Anemia, iron deficiency   . Anxiety   . CAD (coronary artery disease)   . Type II or unspecified type diabetes mellitus without mention of  complication, not stated as uncontrolled   . GERD (gastroesophageal reflux disease)   . HTN (hypertension)   . Peripheral neuropathy   . Schizophrenia   . Hyperlipidemia   . Allergic rhinitis   . 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 Dementia   . IBS (irritable bowel syndrome)   . Syncope     EP study 1996 w/atrial tachycardia  . Esophageal motility disorder   . Renal insufficiency    Past Surgical History:  Past Surgical History  Procedure Laterality Date  . Abdominal hysterectomy    . Ptca  1995  . Knee arthroplasty      Right   . Breast biopsy      Negative   HPI:  Other Pertinent Information: 74 year old female with h/o HTN, DM,  schizophrenia, admitted 8/19 w/ working dx of CAP. Developed progressive  airspace disease on CXR and worsening hypoxia on 8/20. Transferred to the  ICU for emergent intubation. Extubated 8/30 am.   No Data Recorded  Assessment / Plan / Recommendation CHL IP CLINICAL IMPRESSIONS 12/21/2014  Therapy Diagnosis Mild oral phase dysphagia;Mild pharyngeal phase  dysphagia  Clinical Impression Pt presents with a mild-mod oropharyngeal dysphagia  marked by mild lingual discoordination, mild delay in swallow initiation,  and  immediate penetration of nectar-thick liquids - reaches the level of  the vocal folds and subglottal space.  No cough response was elicited.   Suspect inadequate laryngeal closure as source of deficit.  Honey-thick  liquids and purees were swallowed with adequate pharyngeal clearance and  no penetration of airway.  Recommend initiating a dysphagia 1 diet with  honey-thick liquids initially; provide full supervision given MS deficits;  meds whole in puree.  SLP will follow for safety and diet advancement.  CHL IP TREATMENT RECOMMENDATION 12/21/2014  Treatment Recommendations Therapy as outlined in treatment plan below     CHL IP DIET RECOMMENDATION 12/21/2014  SLP Diet Recommendations Dysphagia 1 (Puree)  Liquid Administration via (None)  Medication Administration Whole meds with puree  Compensations Minimize environmental distractions;Slow rate;Small  sips/bites  Postural Changes and/or Swallow Maneuvers (None)     CHL IP OTHER RECOMMENDATIONS 12/21/2014  Recommended Consults (None)  Oral Care Recommendations Oral care BID  Other Recommendations Order thickener from pharmacy     No flowsheet data found.   CHL IP FREQUENCY AND DURATION 12/21/2014  Speech Therapy Frequency (ACUTE ONLY) min 3x week  Treatment Duration 2 weeks         SLP Swallow Goals No flowsheet data found.  No flowsheet data found.    CHL IP REASON FOR REFERRAL 12/21/2014  Reason for Referral Objectively evaluate swallowing function     CHL IP ORAL PHASE 12/21/2014  Lips (None)  Tongue (None)  Mucous membranes (None)  Nutritional status (None)  Other (None)  Oxygen therapy (None)  Oral Phase Impaired  Oral - Pudding Teaspoon (None)  Oral - Pudding Cup (None)  Oral - Honey Teaspoon (None)  Oral - Honey Cup (None)  Oral - Honey Syringe (None)  Oral - Nectar Teaspoon (None)  Oral - Nectar Cup (None)  Oral - Nectar Straw (None)  Oral - Nectar Syringe (None)  Oral - Ice Chips (None)  Oral - Thin Teaspoon (None)  Oral - Thin Cup (None)  Oral - Thin Straw  (None)  Oral - Thin Syringe (None)  Oral - Puree (None)  Oral - Mechanical Soft (None)  Oral - Regular (None)  Oral - Multi-consistency (None)  Oral - Pill (None)  Oral Phase - Comment (None)      CHL IP PHARYNGEAL PHASE 12/21/2014  Pharyngeal Phase Impaired  Pharyngeal - Pudding Teaspoon (None)  Penetration/Aspiration details (pudding teaspoon) (None)  Pharyngeal - Pudding Cup (None)  Penetration/Aspiration details (pudding cup) (None)  Pharyngeal - Honey Teaspoon (None)  Penetration/Aspiration details (honey teaspoon) (None)  Pharyngeal - Honey Cup (None)  Penetration/Aspiration details (honey cup) (None)  Pharyngeal - Honey Syringe (None)  Penetration/Aspiration details (honey syringe) (None)  Pharyngeal - Nectar Teaspoon (None)  Penetration/Aspiration details (nectar teaspoon) (None)  Pharyngeal - Nectar Cup (None) Penetration/Aspiration details (nectar cup)  (None)  Pharyngeal - Nectar Straw (None)  Penetration/Aspiration details (nectar straw) (None)  Pharyngeal - Nectar Syringe (None)  Penetration/Aspiration details (nectar syringe) (None)  Pharyngeal - Ice Chips (None)  Penetration/Aspiration details (ice chips) (None)  Pharyngeal - Thin Teaspoon (None)  Penetration/Aspiration details (thin teaspoon) (None)  Pharyngeal - Thin Cup (None)  Penetration/Aspiration details (thin cup) (None)  Pharyngeal - Thin Straw (None)  Penetration/Aspiration details (thin straw) (None)  Pharyngeal - Thin Syringe (None)  Penetration/Aspiration details (thin syringe') (None)  Pharyngeal - Puree (None)  Penetration/Aspiration details (puree) (None)  Pharyngeal - Mechanical Soft (None)  Penetration/Aspiration details (mechanical soft) (None)  Pharyngeal - Regular (None)  Penetration/Aspiration details (regular) (None)  Pharyngeal - Multi-consistency (None)  Penetration/Aspiration details (multi-consistency) (None)  Pharyngeal - Pill (None)  Penetration/Aspiration details (pill) (None)  Pharyngeal Comment (None)      CHL IP  CERVICAL ESOPHAGEAL PHASE 12/21/2014  Cervical Esophageal Phase (No Data)  Pudding Teaspoon (None)  Pudding Cup (None)  Honey Teaspoon (None)  Honey Cup (None)  Honey Straw (None)  Nectar Teaspoon (None)  Nectar Cup (None)  Nectar Straw (None)  Nectar Sippy Cup (None)  Thin Teaspoon (None)  Thin Cup (None)  Thin Straw (None)  Thin [place]ippy Cup (None)  Cervical Esophageal Comment (None)    No flowsheet data found.         Juan Quam Laurice 12/21/2014, 2:10 PM Estill Bamberg L. [place], [place] CCC/SLP Pager [phone]    Medications: I have reviewed the patient's current medications. Scheduled Meds: . amitriptyline  25 mg Oral QHS  . amLODipine  10 mg Oral Daily  . antiseptic oral rinse  7 mL Mouth Rinse QID  . aspirin  325 mg Oral Daily  . benztropine  2 mg Oral Daily  . chlorhexidine gluconate  15 mL Mouth Rinse BID  . clonazePAM  0.25 mg Oral QHS  . donepezil  10 mg Oral QHS  . enoxaparin (LOVENOX) injection  40 mg Subcutaneous Q24H  . feeding supplement (ENSURE ENLIVE)  237 mL Oral BID BM  . Influenza vac split quadrivalent PF  0.5 mL Intramuscular Tomorrow-1000  . insulin aspart  0-15 Units Subcutaneous 6 times per day  . insulin glargine  12 Units Subcutaneous QHS  . memantine  10 mg Oral BID  . multivitamin  5 mL Oral Daily  . pantoprazole  40 mg Oral QHS  . potassium chloride  10 mEq Intravenous Q1 Hr x 4  . ramipril  10 mg Oral Daily  . RESOURCE THICKENUP CLEAR   Oral Once   Continuous Infusions: . dextrose     PRN Meds:.albuterol, fentaNYL (SUBLIMAZE) injection, LORazepam, RESOURCE THICKENUP CLEAR Assessment/Plan: Active Problems:   Diabetes mellitus, type 2   Dementia   Schizophrenia, unspecified type   Essential hypertension   CAP (community acquired pneumonia)   Acute respiratory failure with hypoxia   ARDS (adult respiratory distress syndrome)   Acute encephalopathy   Hypernatremia   Hypokalemia   Sepsis   Anemia  Sepsis with acute bronchitis with mucus plugging and lobar  collapse causing acute hypoxic respiratory failure and ARDS: Patient was admitted on 8/19 and treated for CAP with Azithro and Rocephin. CXR with LUL opacity. Respiratory status declined and she developed acute hypoxic respiratory failure and ARDS. She was intubated and transferred to ICU. After being intubated with aggressive suctioning, the LUL opacity resolved which is inconsistent with a PNA picture. Likely that the opacity was 2/2 mucus plug from acute bronchitis that led to ARDS. Extubated on 8/31, sats in upper 90s on 4L [place]. Completed 8 day course of Azithro and Rocephin on 8/26. Afebrile > 48 hours. WBC trending down form 15 to 10 today. Blood and tracheal aspirate cultures all negative. - Transfer to floor today - Incentive spirometry and flutter valve - PT/OT - Supportive care - Wean O2 to maintain sats >92%  HTN: Home BP meds were held in setting of sepsis. Amlodpine 10 mg daily was restarted on 8/31. BP remains elevated today with SBP readings in the 160-170s. Will restart home meds. - Start ramipril 10 mg daily   Acute Encephalopathy: Resolved. Appears to be at or near her baseline today.   Hypernatremia: Calculated free water deficit of 3.18 L. Will replace fluids and recheck BMP. - D5W at 100 ml/hr - Repeat BMP at 1800, AM  Hypokalemia: K 3.3 this morning. Will replace. - IV KCl at 10 mEq/hr x 4 hours  Anemia: Hgb 8.1 today improved from 7.1 yesterday. Likely falsely elevated in the setting of volume contraction and will likely decrease with fluid recesitation. Iron studies with minimally decreased TIBC but otherwise normal. MCV 102. B12 and folate wnl. No over bleeding. FOBT  on admission was negative. - CBC in AM  DM type II: Last A1c 7.7 on 11/09/2010. On 25 Units novolog bid and 15 units lantus qhs at home. BG have been in the 150-160 range.  - Lantus 12 units whs - SSI moderate   Schizophrenia:  - Continue home amitriptyline, benztropine, donepezil, memantine,  clonazepam - Ativan 1-4 mg IV q4hr prn anxiety   Diet: SLP following. Dys 1 diet.  DVT PPx: Lonenox 40 mg SQ daily  CODE: FULL  Dispo: Disposition is deferred at this time, awaiting improvement of current medical problems.  Anticipated discharge in approximately 1-3 day(s).   The patient does not have a current PCP (Provider Not In System) and does need an [facility] hospital follow-up appointment after discharge.  The patient does not have transportation limitations that hinder transportation to clinic appointments.  .Services Needed at time of discharge: Y = Yes, Blank = No PT:   OT:   RN:   Equipment:   Other:     LOS: 14 days   Maryellen Pile, MD 12/23/2014, 12:51 PM

## 2014-12-23 NOTE — Progress Notes (Signed)
Physical Therapy Treatment Patient Details Name: [name] MRN: [id number] DOB: Nov 09, 1940 Today's Date: 12/23/2014    History of Present Illness  74 year-old lady with history of CAD, ypertension, type II diabetes, hyperlipidemia, and schizophrenia who presented with weakness, cough, fever, and diarrhea. Chest x-ray consistent with CAP. Intubated 8/20. Extubated 8/30.     PT Comments    Pt admitted with above diagnosis. Pt currently with functional limitations due to balance and endurance deficits. Pt able to stand and perform stand pivot transfer to recliner.  Less assist than previous session.  Pt progressing.   Pt will benefit from skilled PT to increase their independence and safety with mobility to allow discharge to the venue listed below.    Follow Up Recommendations  SNF     Equipment Recommendations  None recommended by PT;Other (comment) (TBA at next venue)    Recommendations for Other Services       Precautions / Restrictions Precautions Precautions: Fall Restrictions Weight Bearing Restrictions: No    Mobility  Bed Mobility Overal bed mobility: Needs Assistance Bed Mobility: Supine to Sit     Supine to sit: Min assist;Mod assist     General bed mobility comments: Pt assisting with bed mobility. delay in initiation  Transfers Overall transfer level: Needs assistance Equipment used: Rolling walker (2 wheeled) Transfers: Sit to/from [facility] Sit to Stand: Mod assist Stand pivot transfers: Mod assist       General transfer comment: Pt needed assist to power up.  Once up, posterior bias needing assist to lean forward and for postural stability.  Pt able to take a few steps around to chair with notable incr in effort to move left LE to step around.  Needed assit to control descent into chair as well.    Ambulation/Gait                 Stairs            Wheelchair Mobility    Modified Rankin (Stroke Patients Only)        Balance Overall balance assessment: Needs assistance Sitting-balance support: No upper extremity supported;Feet supported Sitting balance-Leahy Scale: Fair Sitting balance - Comments: Pt able to sit EOB unsupported for up to 5 min.  Could accept min challenges to balance.    Standing balance support: Bilateral upper extremity supported;During functional activity Standing balance-Leahy Scale: Poor Standing balance comment: Requires UE support and physical support secondary still with posterior bias in standing.                     Cognition Arousal/Alertness: Awake/alert Behavior During Therapy: Flat affect;Anxious Overall Cognitive Status: Within Functional Limits for tasks assessed                      Exercises General Exercises - Upper Extremity Shoulder Flexion: AROM;Both;10 reps;Seated Shoulder Horizontal ABduction: AROM;Both;5 reps;Seated General Exercises - Lower Extremity Ankle Circles/Pumps: AAROM;Both;5 reps;Seated Long Arc Quad: AROM;Both;10 reps;Seated Hip Flexion/Marching: AROM;Both;5 reps;Seated    General Comments        Pertinent Vitals/Pain Pain Assessment: 0-10 Pain Score: 4  Pain Location: back Pain Descriptors / Indicators: Aching;Sore Pain Intervention(s): Limited activity within patient's tolerance;Monitored during session;Repositioned  VSS    Home Living                      Prior Function            PT Goals (current goals can  now be found in the care plan section) Progress towards PT goals: Progressing toward goals    Frequency  Min 3X/week    PT Plan Current plan remains appropriate    Co-evaluation             End of Session Equipment Utilized During Treatment: Gait belt;Oxygen Activity Tolerance: Patient limited by fatigue Patient left: in chair;with call bell/phone within reach;with family/visitor present     Time: 1200-1229 PT Time Calculation (min) (ACUTE ONLY): 29 min  Charges:   $Therapeutic Exercise: 8-22 mins $Therapeutic Activity: 8-22 mins                    G Codes:      Kilea Mccarey, Godfrey Pick 12/27/2014, 1:14 PM Adynn Caseres,PT Acute Rehabilitation [phone] (pager)

## 2014-12-23 NOTE — Progress Notes (Signed)
Internal Medicine Attending  Date: 12/23/2014  Patient name: [name] Medical record number: [id number] Date of birth: [birth year] Age: 74 y.o. Gender: female  I saw and evaluated the patient. I reviewed the resident's note by Dr. Charlynn Grimes and I agree with the resident's findings and plans as documented in his progress note.  [name] was resting comfortably in bed when seen on rounds this morning although she appeared weak and debilitated as expected after several days on a ventilator in the ICU. Review of her previous films suggested a possible left upper lobe community-acquired pneumonia on admission which quickly resolved after intubation. This suggests it was more likely mucous plugging rather than a pneumonia as it would not resolve as quickly if it was a bactetial pneumonia. Putting things together, it is likely she had sepsis related to some infectious source, whether it be the urine or lung, which resulted in ARDS requiring intubation and mechanical ventilation. Her oxygenation appeared to remain stable as we lowered the supplemental oxygen from 4 L/m to 3 L/m. I agree with the housestaff's plan to transfer to a general medical ward and slowly wean off the oxygen to keep her O2 sats greater than 92%. Physical therapy is the most important aspect of her acute care at this point and they will work on rehabilitating her after her ICU stay. She will also be transferred to a skilled nursing facility for further strengthening upon discharge. At this point we will continue to decrease the tether's of telemetry and unnecessary IVs as we move her towards being ready for discharge.

## 2014-12-24 ENCOUNTER — Inpatient Hospital Stay (HOSPITAL_COMMUNITY): Payer: Medicare Other

## 2014-12-24 DIAGNOSIS — J209 Acute bronchitis, unspecified: Secondary | ICD-10-CM

## 2014-12-24 LAB — BASIC METABOLIC PANEL
ANION GAP: 9 (ref 5–15)
Anion gap: 10 (ref 5–15)
Anion gap: 10 (ref 5–15)
BUN: 12 mg/dL (ref 6–20)
BUN: 11 mg/dL (ref 6–20)
BUN: 10 mg/dL (ref 6–20)
CALCIUM: 9.4 mg/dL (ref 8.9–10.3)
CHLORIDE: 111 mmol/L (ref 101–111)
CHLORIDE: 110 mmol/L (ref 101–111)
CO2: 26 mmol/L (ref 22–32)
CO2: 26 mmol/L (ref 22–32)
CO2: 24 mmol/L (ref 22–32)
CREATININE: 0.71 mg/dL (ref 0.44–1.00)
CREATININE: 0.81 mg/dL (ref 0.44–1.00)
Calcium: 9.1 mg/dL (ref 8.9–10.3)
Calcium: 9.4 mg/dL (ref 8.9–10.3)
Chloride: 110 mmol/L (ref 101–111)
Creatinine, Ser: 0.75 mg/dL (ref 0.44–1.00)
GFR calc Af Amer: >60 mL/min (ref >60)
GFR calc Af Amer: >60 mL/min (ref >60)
GFR calc non Af Amer: >60 mL/min (ref >60)
GFR calc non Af Amer: >60 mL/min (ref >60)
GLUCOSE: 173 mg/dL — AB (ref 65–99)
GLUCOSE: 162 mg/dL — AB (ref 65–99)
Glucose, Bld: 149 mg/dL — ABNORMAL HIGH (ref 65–99)
POTASSIUM: 3.4 mmol/L — AB (ref 3.5–5.1)
Potassium: 3.2 mmol/L — ABNORMAL LOW (ref 3.5–5.1)
Potassium: 3.7 mmol/L (ref 3.5–5.1)
SODIUM: 146 mmol/L — AB (ref 135–145)
SODIUM: 146 mmol/L — AB (ref 135–145)
Sodium: 144 mmol/L (ref 135–145)

## 2014-12-24 LAB — CBC
HEMATOCRIT: 25.8 % — AB (ref 36.0–46.0)
HEMOGLOBIN: 8.2 g/dL — AB (ref 12.0–15.0)
MCH: 32.2 pg (ref 26.0–34.0)
MCHC: 31.8 g/dL (ref 30.0–36.0)
MCV: 101.2 fL — AB (ref 78.0–100.0)
Platelets: 537 10*3/uL — ABNORMAL HIGH (ref 150–400)
RBC: 2.55 MIL/uL — AB (ref 3.87–5.11)
RDW: 16 % — ABNORMAL HIGH (ref 11.5–15.5)
WBC: 10.8 10*3/uL — AB (ref 4.0–10.5)

## 2014-12-24 LAB — GLUCOSE, CAPILLARY
Glucose-Capillary: 153 mg/dL — ABNORMAL HIGH (ref 65–99)
Glucose-Capillary: 110 mg/dL — ABNORMAL HIGH (ref 65–99)
Glucose-Capillary: 212 mg/dL — ABNORMAL HIGH (ref 65–99)
Glucose-Capillary: 197 mg/dL — ABNORMAL HIGH (ref 65–99)
Glucose-Capillary: 229 mg/dL — ABNORMAL HIGH (ref 65–99)

## 2014-12-24 LAB — HEMOGLOBIN A1C
Hgb A1c MFr Bld: 6.8 % — ABNORMAL HIGH (ref 4.8–5.6)
Mean Plasma Glucose: 148 mg/dL

## 2014-12-24 MED ORDER — DEXTROSE-NACL 5-0.45 % IV SOLN
INTRAVENOUS | Status: DC
  Administered 2014-12-24: 12:00:00 via INTRAVENOUS

## 2014-12-24 MED ORDER — HALOPERIDOL 5 MG PO TABS
10.0000 mg | ORAL_TABLET | Freq: Two times a day (BID) | ORAL | Status: DC
Start: 2014-12-24 — End: 2014-12-27
  Administered 2014-12-24 – 2014-12-25 (×2): 10 mg via ORAL
  Filled 2014-12-24 (×8): qty 2

## 2014-12-24 MED ORDER — CLOZAPINE 100 MG PO TABS
200.0000 mg | ORAL_TABLET | Freq: Every day | ORAL | Status: DC
  Administered 2014-12-24 – 2014-12-25 (×2): 200 mg via ORAL
  Administered 2014-12-26: 100 mg via ORAL
  Filled 2014-12-24 (×4): qty 2

## 2014-12-24 MED ORDER — POTASSIUM CHLORIDE CRYS ER 20 MEQ PO TBCR
40.0000 meq | EXTENDED_RELEASE_TABLET | Freq: Once | ORAL | Status: AC
  Administered 2014-12-24: 40 meq via ORAL
  Filled 2014-12-24: qty 2

## 2014-12-24 MED ORDER — RAMIPRIL 10 MG PO CAPS
20.0000 mg | ORAL_CAPSULE | Freq: Every day | ORAL | Status: DC
  Administered 2014-12-24 – 2014-12-25 (×2): 20 mg via ORAL
  Filled 2014-12-24 (×5): qty 2

## 2014-12-24 MED ORDER — DM-GUAIFENESIN ER 30-600 MG PO TB12
1.0000 | ORAL_TABLET | Freq: Two times a day (BID) | ORAL | Status: DC
  Administered 2014-12-24 – 2014-12-27 (×6): 1 via ORAL
  Filled 2014-12-24 (×6): qty 1

## 2014-12-24 NOTE — Progress Notes (Signed)
MBSS complete. Full report located under chart review in imaging section.  Recommendations: Dysphagia 2, honey thick, meds whole in puree, full supervision- cue to swallow 2x. Will continue to follow.  Blair Heys, [place], [place]

## 2014-12-24 NOTE — Progress Notes (Signed)
Patient OOB to [facility] using a walker for about 86min. Tolerated well. Requested to go back to bed.

## 2014-12-24 NOTE — Progress Notes (Signed)
Subjective: Patient reports no complaints this morning. She denies any SOB or diarrhea. Does have persistent cough with yellow mucus production. There was no flutter valve or incentive spirometer in the room this morning.   Objective: Vital signs in last 24 hours: Filed Vitals:   12/23/14 1545 12/23/14 2019 12/24/14 0259 12/24/14 0401  BP: 156/63 149/68  163/60  Pulse: 103 108  101  Temp: 98.8 F (37.1 C) 98.5 F (36.9 C)  99 F (37.2 C)  TempSrc: Oral     Resp: 18 18  19   Height:      Weight:  160 lb 11.5 oz (72.9 kg) 160 lb 11.5 oz (72.9 kg)   SpO2: 100% 100%  97%   Weight change: -1 lb 1.6 oz (-0.5 kg)  Intake/Output Summary (Last 24 hours) at 12/24/14 0852 Last data filed at 12/24/14 0245  Gross per 24 hour  Intake 1498.33 ml  Output    250 ml  Net 1248.33 ml   Physical Exam GENERAL- alert, co-operative, not in any distress. HEENT- Atraumatic, normocephalic, PERRL, EOMI, oral mucosa appears moist CARDIAC- RRR, no murmurs, rubs or gallops. RESP- Moving equal volumes of air, bibasilar crackles and decreased breath sounds, no wheezes ABDOMEN- Soft, nontender, bowel sounds present. NEURO- No obvious Cr N abnormality, Oriented to person only EXTREMITIES- pulse 2+, symmetric, no pedal edema. SKIN- Warm, dry, No rash or lesion. PSYCH- Normal mood and affect, appropriate thought content and speech.  Lab Results: Basic Metabolic Panel:  Recent Labs Lab 12/20/14 0430 12/21/14 0625  12/23/14 0240  12/24/14 0105 12/24/14 0440  NA 145 149*  < > 152*  < > 146* 146*  K 3.5 3.6  < > 3.3*  < > 3.2* 3.4*  CL 106 106  < > 113*  < > 110 111  CO2 31 30  < > 31  < > 26 26  GLUCOSE 223* 126*  < > 178*  < > 173* 149*  BUN 39* 24*  < > 13  < > 12 11  CREATININE 0.85 0.85  < > 0.80  < > 0.75 0.71  CALCIUM 9.3 8.9  < > 9.4  < > 9.4 9.1  MG 2.1  --   --  2.2  --   --   --   PHOS 2.7 3.4  --   --   --   --   --   < > = values in this interval not displayed. Liver Function  Tests:  Recent Labs Lab 12/23/14 0240  AST 26  ALT 47  ALKPHOS 74  BILITOT 0.5  PROT 6.7  ALBUMIN 2.3*   CBC:  Recent Labs Lab 12/23/14 0240 12/24/14 0440  WBC 10.8* 10.8*  NEUTROABS 7.4  --   HGB 8.1* 8.2*  HCT 28.0* 25.8*  MCV 102.6* 101.2*  PLT 536* 537*   CBG:  Recent Labs Lab 12/23/14 1247 12/23/14 1636 12/23/14 2014 12/23/14 2355 12/24/14 0355 12/24/14 0744  GLUCAP 197* 292* 180* 146* 153* 110*   Hemoglobin A1C:  Recent Labs Lab 12/23/14 0240  HGBA1C 6.8*   Coagulation:  Recent Labs Lab 12/23/14 0240  LABPROT 15.8*  INR 1.24   Anemia Panel:  Recent Labs Lab 12/22/14 2042 12/23/14 0240  VITAMINB12  --  1390*  FOLATE 23.7  --   FERRITIN  --  268  TIBC  --  237*  IRON  --  32  RETICCTPCT  --  2.9   Urine Drug Screen: Drugs of Abuse  Component Value Date/Time   LABOPIA NONE DETECTED 12/30/2007 2051   COCAINSCRNUR NONE DETECTED 12/30/2007 2051   LABBENZ POSITIVE* 12/30/2007 2051   AMPHETMU NONE DETECTED 12/30/2007 2051   THCU NONE DETECTED 12/30/2007 2051   LABBARB  12/30/2007 2051    NONE DETECTED        DRUG SCREEN FOR MEDICAL PURPOSES ONLY.  IF CONFIRMATION IS NEEDED FOR ANY PURPOSE, NOTIFY LAB WITHIN 5 DAYS.    Micro Results: No results found for this or any previous visit (from the past 240 hour(s)). Studies/Results: No results found. Medications: I have reviewed the patient's current medications. Scheduled Meds: . amitriptyline  25 mg Oral QHS  . amLODipine  10 mg Oral Daily  . antiseptic oral rinse  7 mL Mouth Rinse QID  . benztropine  2 mg Oral Daily  . chlorhexidine gluconate  15 mL Mouth Rinse BID  . clonazePAM  0.25 mg Oral QHS  . donepezil  10 mg Oral QHS  . enoxaparin (LOVENOX) injection  40 mg Subcutaneous Q24H  . feeding supplement (ENSURE ENLIVE)  237 mL Oral BID BM  . insulin aspart  0-15 Units Subcutaneous 6 times per day  . insulin glargine  12 Units Subcutaneous QHS  . memantine  10 mg Oral BID    . multivitamin  5 mL Oral Daily  . pantoprazole  40 mg Oral QHS  . potassium chloride  40 mEq Oral Once  . ramipril  20 mg Oral Daily  . RESOURCE THICKENUP CLEAR   Oral Once   Continuous Infusions: . dextrose 5 % and 0.45% NaCl     PRN Meds:.albuterol, LORazepam, RESOURCE THICKENUP CLEAR Assessment/Plan: Active Problems:   Diabetes mellitus, type 2   Dementia   Schizophrenia, unspecified type   Essential hypertension   CAP (community acquired pneumonia)   Acute respiratory failure with hypoxia   ARDS (adult respiratory distress syndrome)   Acute encephalopathy   Hypernatremia   Hypokalemia   Sepsis   Anemia  Sepsis with acute bronchitis with mucus plugging and lobar collapse causing acute hypoxic respiratory failure and ARDS: s/p 8 day course of ABX, extubated 9/1, all cultures have been negative. Patient improving. Tolerating 3 L O2.  - Incentive spirometry and flutter valve - Continue PT/OT - Supportive care - Wean O2 to maintain sats >92%  HTN: BP remains elevated with SBP in the 160-170s.  - Increase ramipril to 20 mg daily  - Continue Amlodipine  Acute Encephalopathy: Resolved. Appears to be at or near her baseline today.   Hypernatremia: Sodium improved to 146 this morning after receiving D5W.  - D/C D5W - Start D5 1/2 NS at 100 mL/hr - Repeat BMP this afternoon  Hypokalemia: K 3.4 this morning. Will replace.  Anemia: Hgb stable at 8.2.   DM type II: Last A1c 7.7 on 11/09/2010. On 25 Units novolog bid and 15 units lantus qhs at home. BG have been in the 150-160 range.  - Lantus 12 units whs - SSI moderate   Schizophrenia:  - Continue home amitriptyline, benztropine, donepezil, memantine, clonazepam - Ativan 1-4 mg IV q4hr prn anxiety   Diet: SLP following. Remains dysphagic on swallow eval this morning.   DVT PPx: Lonenox 40 mg SQ daily  CODE: FULL  Dispo: Disposition is deferred at this time, awaiting improvement of current medical problems.   Anticipated discharge in approximately 1-3 day(s).   The patient does not have a current PCP (Provider Not In System) and does need an [facility] hospital follow-up  appointment after discharge.  The patient does not have transportation limitations that hinder transportation to clinic appointments.  .Services Needed at time of discharge: Y = Yes, Blank = No PT:   OT:   RN:   Equipment:   Other:     LOS: 15 days   Jane Pile, MD 12/24/2014, 8:52 AM

## 2014-12-24 NOTE — Progress Notes (Signed)
  Date: 12/24/2014  Patient name: [name]  Medical record number: [id number]  Date of birth: 1940/08/31   This patient's plan of care was discussed with the house staff. Please see Dr. Shanna Cisco note for complete details. I concur with his findings.  Patient continuing to improve.  Working with PT/OT who recommend SNF.     Sid Falcon, MD 12/24/2014, 12:16 PM

## 2014-12-24 NOTE — Progress Notes (Signed)
Patient's ANC was submitted to the [facility] programs. Unable to verify patient's outpatient prescriber. Will follow-up  Levester Fresh, PharmD, BCPS Clinical Pharmacist Pager [phone] 12/24/2014 3:11 PM

## 2014-12-25 DIAGNOSIS — G309 Alzheimer's disease, unspecified: Secondary | ICD-10-CM

## 2014-12-25 DIAGNOSIS — F0280 Dementia in other diseases classified elsewhere without behavioral disturbance: Secondary | ICD-10-CM

## 2014-12-25 DIAGNOSIS — E1151 Type 2 diabetes mellitus with diabetic peripheral angiopathy without gangrene: Secondary | ICD-10-CM

## 2014-12-25 DIAGNOSIS — K219 Gastro-esophageal reflux disease without esophagitis: Secondary | ICD-10-CM

## 2014-12-25 DIAGNOSIS — E1142 Type 2 diabetes mellitus with diabetic polyneuropathy: Secondary | ICD-10-CM

## 2014-12-25 DIAGNOSIS — J309 Allergic rhinitis, unspecified: Secondary | ICD-10-CM

## 2014-12-25 LAB — BASIC METABOLIC PANEL
Anion gap: 12 (ref 5–15)
BUN: 10 mg/dL (ref 6–20)
CHLORIDE: 110 mmol/L (ref 101–111)
CO2: 20 mmol/L — ABNORMAL LOW (ref 22–32)
CREATININE: 0.89 mg/dL (ref 0.44–1.00)
Calcium: 9 mg/dL (ref 8.9–10.3)
GFR calc non Af Amer: >60 mL/min (ref >60)
Glucose, Bld: 141 mg/dL — ABNORMAL HIGH (ref 65–99)
POTASSIUM: 3.8 mmol/L (ref 3.5–5.1)
SODIUM: 142 mmol/L (ref 135–145)

## 2014-12-25 LAB — GLUCOSE, CAPILLARY
GLUCOSE-CAPILLARY: 204 mg/dL — AB (ref 65–99)
Glucose-Capillary: 116 mg/dL — ABNORMAL HIGH (ref 65–99)
Glucose-Capillary: 166 mg/dL — ABNORMAL HIGH (ref 65–99)
Glucose-Capillary: 172 mg/dL — ABNORMAL HIGH (ref 65–99)
Glucose-Capillary: 223 mg/dL — ABNORMAL HIGH (ref 65–99)
Glucose-Capillary: 284 mg/dL — ABNORMAL HIGH (ref 65–99)

## 2014-12-25 LAB — CBC
HEMATOCRIT: 26.6 % — AB (ref 36.0–46.0)
Hemoglobin: 7.9 g/dL — ABNORMAL LOW (ref 12.0–15.0)
MCH: 29.5 pg (ref 26.0–34.0)
MCHC: 29.7 g/dL — ABNORMAL LOW (ref 30.0–36.0)
MCV: 99.3 fL (ref 78.0–100.0)
PLATELETS: 365 10*3/uL (ref 150–400)
RBC: 2.68 MIL/uL — AB (ref 3.87–5.11)
RDW: 15.6 % — ABNORMAL HIGH (ref 11.5–15.5)
WBC: 8.7 10*3/uL (ref 4.0–10.5)

## 2014-12-25 MED ORDER — ASPIRIN 325 MG PO TABS
325.0000 mg | ORAL_TABLET | Freq: Every day | ORAL | Status: DC
  Administered 2014-12-26 – 2014-12-27 (×2): 325 mg via ORAL
  Filled 2014-12-25 (×2): qty 1

## 2014-12-25 MED ORDER — POTASSIUM CHLORIDE ER 8 MEQ PO TBCR
8.0000 meq | EXTENDED_RELEASE_TABLET | Freq: Every day | ORAL | Status: DC
  Administered 2014-12-25 – 2014-12-27 (×2): 8 meq via ORAL
  Filled 2014-12-25 (×3): qty 1

## 2014-12-25 MED ORDER — INSULIN ASPART 100 UNIT/ML ~~LOC~~ SOLN
0.0000 [IU] | Freq: Three times a day (TID) | SUBCUTANEOUS | Status: DC
  Administered 2014-12-27: 5 [IU] via SUBCUTANEOUS

## 2014-12-25 MED ORDER — ACETAMINOPHEN 500 MG PO TABS
500.0000 mg | ORAL_TABLET | Freq: Four times a day (QID) | ORAL | Status: AC | PRN
  Administered 2014-12-25: 500 mg via ORAL
  Filled 2014-12-25: qty 1

## 2014-12-25 MED ORDER — SENNOSIDES-DOCUSATE SODIUM 8.6-50 MG PO TABS
1.0000 | ORAL_TABLET | Freq: Every day | ORAL | Status: DC
Start: 2014-12-25 — End: 2014-12-27
  Administered 2014-12-25 – 2014-12-26 (×2): 1 via ORAL
  Filled 2014-12-25 (×2): qty 1

## 2014-12-25 MED ORDER — FLUTICASONE PROPIONATE 50 MCG/ACT NA SUSP: 2.0000 | Freq: Every day | NASAL | Status: DC | PRN

## 2014-12-25 MED ORDER — ACETAMINOPHEN 325 MG PO TABS
650.0000 mg | ORAL_TABLET | Freq: Once | ORAL | Status: AC
Start: 2014-12-25 — End: 2014-12-25
  Administered 2014-12-25: 650 mg via ORAL
  Filled 2014-12-25: qty 2

## 2014-12-25 MED ORDER — INSULIN GLARGINE 100 UNIT/ML ~~LOC~~ SOLN
15.0000 [IU] | Freq: Every day | SUBCUTANEOUS | Status: DC
  Administered 2014-12-25 – 2014-12-26 (×2): 15 [IU] via SUBCUTANEOUS
  Filled 2014-12-25 (×3): qty 0.15

## 2014-12-25 MED ORDER — LEVOCETIRIZINE DIHYDROCHLORIDE 5 MG PO TABS: 5.0000 mg | ORAL_TABLET | Freq: Every evening | ORAL | Status: DC

## 2014-12-25 MED ORDER — LORATADINE 10 MG PO TABS
10.0000 mg | ORAL_TABLET | Freq: Every day | ORAL | Status: DC
  Administered 2014-12-25 – 2014-12-27 (×3): 10 mg via ORAL
  Filled 2014-12-25 (×3): qty 1

## 2014-12-25 MED ORDER — GABAPENTIN 600 MG PO TABS
600.0000 mg | ORAL_TABLET | Freq: Three times a day (TID) | ORAL | Status: DC
  Administered 2014-12-25: 600 mg via ORAL
  Filled 2014-12-25 (×2): qty 1

## 2014-12-25 MED ORDER — INSULIN ASPART 100 UNIT/ML ~~LOC~~ SOLN
0.0000 [IU] | Freq: Once | SUBCUTANEOUS | Status: AC
  Administered 2014-12-25: 5 [IU] via SUBCUTANEOUS

## 2014-12-25 NOTE — Progress Notes (Signed)
   12/25/14 0958  What Happened  Was fall witnessed? No  Was patient injured? Yes  Patient found on floor  Found by Staff-comment Cassell Smiles)  Stated prior activity to/from bed, chair, or stretcher  Follow Up  MD notified yes (Dr. Marlowe Sax)  Time MD notified [phone]  Family notified No- patient refusal  Additional tests No  Simple treatment Other (comment) (pain med, reposition)  Progress note created (see row info) Yes  Pain Assessment  Pain Assessment 0-10  Pain Score 0  Faces Pain Scale 8  Pain Type Acute pain  Pain Location Knee  Pain Orientation Right;Left  Pain Descriptors / Indicators Throbbing  Pain Frequency Constant  Pain Onset On-going  Patients Stated Pain Goal 0  Pain Intervention(s) Repositioned  Neurological  Neuro (WDL) WDL  Level of Consciousness Alert  Orientation Level Oriented X4  Cognition Appropriate at baseline  Speech Clear  Pupil Assessment  Yes  R Pupil Size (mm) 3  R Pupil Shape Round  R Pupil Reaction Brisk  L Pupil Size (mm) 3  L Pupil Shape Round  L Pupil Reaction Brisk  Additional Pupil Assessments No  R Hand Grip Present  L Hand Grip Present  RUE Motor Response Purposeful movement  RUE Motor Strength 4  LUE Motor Response Purposeful movement  LUE Motor Strength 4  RLE Motor Response Purposeful movement  RLE Motor Strength 4  LLE Motor Response Purposeful movement  LLE Motor Strength 4  Neuro Symptoms None  Musculoskeletal  Musculoskeletal (WDL) X  Assistive Device BSC;Cane;Front wheel walker  Generalized Weakness Yes  Integumentary  Integumentary (WDL) X  Skin Color Appropriate for ethnicity  Skin Integrity Ecchymosis  Ecchymosis Location Hip  Ecchymosis Location Orientation Right  Pain Assessment  Date Pain First Started 12/25/14  Result of Injury Yes  Pain Assessment  Work-Related Injury No    12/25/14 0956  Vitals  BP (!) 131/55 mmHg  BP Location Right Arm  BP Method Automatic  Patient Position  (if appropriate) Lying  Pulse Rate (!) 118  Pulse Rate Source Dinamap  Resp 19  Oxygen Therapy  SpO2 95 %  O2 Device Room Air

## 2014-12-25 NOTE — Progress Notes (Signed)
  Date: 12/25/2014  Patient name: [name]  Medical record number: [id number]  Date of birth: May 16, 1940   This patient's plan of care was discussed with the house staff. Please see Dr. Harley Hallmark note for complete details. I concur with her findings.   Sid Falcon, MD 12/25/2014, 8:26 PM

## 2014-12-25 NOTE — Progress Notes (Signed)
Paged by nursing staff stating patient fell while getting out of chair to go to her bed. They found her next to the chair, patient was on her knees, she did not loose consciousness. I saw and evaluated the patient. She denied hitting her head. States she felt "dizzy" and then fell with her knees hitting the ground. Denies having any palpitations prior to the fall. Denied having a black out or loss of consciousness. Patient complaining of bilateral knee pain at present but states she has chronic bilateral knee pain due to arthritis and has had surgeries in the past.   Patient has been given Tylenol 500 mg q6 prn pain.   Neuro examination: CN II-XII grossly intact, strength and sensation grossly intact in bilateral upper and lower extremities Knee examination: bilateral knees mildly tender to palpation, normal ROM - no pain on movement

## 2014-12-25 NOTE — Progress Notes (Addendum)
Subjective:  Pt seen and examined in AM.  No acute events overnight. Pt reports she is breathing well with mild cough. She denies chest pain or wheezing. She reports mild constipation with no abdominal pain, nausea, or vomiting.   Objective: Vital signs in last 24 hours: Filed Vitals:   12/24/14 2049 12/25/14 0439 12/25/14 0956 12/25/14 1121  BP: 151/74 157/62 131/55 158/64  Pulse: 99 107 118 106  Temp: 98.1 F (36.7 C) 98.2 F (36.8 C)    TempSrc:      Resp: 20 22 19    Height:      Weight:  160 lb 15 oz (73 kg)    SpO2: 97% 100% 95%    Weight change: 3.5 oz (0.1 kg)  Intake/Output Summary (Last 24 hours) at 12/25/14 1458 Last data filed at 12/25/14 0858  Gross per 24 hour  Intake   1425 ml  Output      0 ml  Net   1425 ml   PHYSICAL EXAMINATION:  General: NAD  Heart: Normal rate and rhythm  Lungs: Decreased breath sounds at bases with no wheezing, ronchi, or rales Abdomen: Soft, non-tender, non-distended, normal BS Extremities: Trace pedal edema Neuro: Drowsy  Lab Results: Basic Metabolic Panel:  Recent Labs Lab 12/20/14 0430 12/21/14 0625  12/23/14 0240  12/24/14 1530 12/25/14 0533  NA 145 149*  < > 152*  < > 144 142  K 3.5 3.6  < > 3.3*  < > 3.7 3.8  CL 106 106  < > 113*  < > 110 110  CO2 31 30  < > 31  < > 24 20*  GLUCOSE 223* 126*  < > 178*  < > 162* 141*  BUN 39* 24*  < > 13  < > 10 10  CREATININE 0.85 0.85  < > 0.80  < > 0.81 0.89  CALCIUM 9.3 8.9  < > 9.4  < > 9.4 9.0  MG 2.1  --   --  2.2  --   --   --   PHOS 2.7 3.4  --   --   --   --   --   < > = values in this interval not displayed. Liver Function Tests:  Recent Labs Lab 12/23/14 0240  AST 26  ALT 47  ALKPHOS 74  BILITOT 0.5  PROT 6.7  ALBUMIN 2.3*   CBC:  Recent Labs Lab 12/23/14 0240 12/24/14 0440 12/25/14 0533  WBC 10.8* 10.8* 8.7  NEUTROABS 7.4  --   --   HGB 8.1* 8.2* 7.9*  HCT 28.0* 25.8* 26.6*  MCV 102.6* 101.2* 99.3  PLT 536* 537* 365   CBG:  Recent  Labs Lab 12/24/14 1650 12/24/14 2034 12/25/14 0005 12/25/14 0409 12/25/14 0752 12/25/14 1137  GLUCAP 197* 229* 284* 116* 204* 172*   Hemoglobin A1C:  Recent Labs Lab 12/23/14 0240  HGBA1C 6.8*   Coagulation:  Recent Labs Lab 12/23/14 0240  LABPROT 15.8*  INR 1.24   Anemia Panel:  Recent Labs Lab 12/22/14 2042 12/23/14 0240  VITAMINB12  --  1390*  FOLATE 23.7  --   FERRITIN  --  268  TIBC  --  237*  IRON  --  32  RETICCTPCT  --  2.9   Urine Drug Screen: Drugs of Abuse     Component Value Date/Time   LABOPIA NONE DETECTED 12/30/2007 2051   COCAINSCRNUR NONE DETECTED 12/30/2007 2051   LABBENZ POSITIVE* 12/30/2007 2051   AMPHETMU NONE DETECTED 12/30/2007  2051   [place] DETECTED 12/30/2007 2051   LABBARB  12/30/2007 2051    NONE DETECTED        DRUG SCREEN FOR MEDICAL PURPOSES ONLY.  IF CONFIRMATION IS NEEDED FOR ANY PURPOSE, NOTIFY LAB WITHIN 5 DAYS.     Micro Results: No results found for this or any previous visit (from the past 240 hour(s)). Studies/Results: Dg Swallowing Func-speech Pathology  12/24/2014    Objective Swallowing Evaluation:    Patient Details  Name: [name] MRN: [id number] Date of Birth: [birth year]  Today's Date: 12/24/2014 Time: SLP Start Time (ACUTE ONLY): 0940-SLP Stop Time (ACUTE ONLY): 1005 SLP Time Calculation (min) (ACUTE ONLY): 25 min  Past Medical History:  Past Medical History  Diagnosis Date  . Anemia, iron deficiency   . Anxiety   . CAD (coronary artery disease)   . Type II or unspecified type diabetes mellitus without mention of  complication, not stated as uncontrolled   . GERD (gastroesophageal reflux disease)   . HTN (hypertension)   . Peripheral neuropathy   . Schizophrenia   . Hyperlipidemia   . Allergic rhinitis   . 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 Dementia   . IBS (irritable bowel syndrome)   . Syncope     EP study 1996 w/atrial tachycardia  . Esophageal motility disorder   . Renal  insufficiency    Past Surgical History:  Past Surgical History  Procedure Laterality Date  . Abdominal hysterectomy    . Ptca  1995  . Knee arthroplasty      Right   . Breast biopsy      Negative   HPI:  Other Pertinent Information: 74 year old female with h/o HTN, DM,  schizophrenia, admitted 8/19 w/ working dx of CAP. Developed progressive  airspace disease on CXR and worsening hypoxia on 8/20. Transferred to the  ICU for emergent intubation. Extubated 8/30 am.   No Data Recorded  Assessment / Plan / Recommendation CHL IP CLINICAL IMPRESSIONS 12/24/2014  Therapy Diagnosis Mild oral phase dysphagia;Moderate pharyngeal phase  dysphagia  Clinical Impression Pt continues to present with a mild oral, moderate  pharyngeal sensorimotor dysphagia characterized by a delay in swallow  initiation, reduced hyolaryngeal elevation, and reduced epiglottic  inversion. This resulted in reduced airway protection and moderate amount  of aspiration x1 of nectar thick liquids prior to swallow initiation which  was not immediately sensed by pt; trace penetration noted on additional  trials of nectar thick liquid when cueing pt to swallow residuals. Mild  residuals noted across consistencies at base of tongue and vallecula which  pt cleared when cued to swallow a 2nd time. Pt did not have dentures which  resulted in mildly prolonged mastication with solid trial but with  adequate pharyngeal clearance, only minimal vallecular residuals. No  penetration/ aspiration noted with trials of honey-thick liquids.  Recommend advancing diet to dysphagia 2 consistency but continue honey  thick liquids by cup, meds whole in puree, full supervision to cue pt to  take small sips and to swallow a 2nd time to clear residuals. Will  continue to follow for diet tolerance/ pt and family education/ consider  advancement or repeat MBS.       CHL IP TREATMENT RECOMMENDATION 12/24/2014  Treatment Recommendations Therapy as outlined in treatment plan below     CHL  IP DIET RECOMMENDATION 12/24/2014  SLP Diet Recommendations Dysphagia 2 (Fine chop);Honey  Liquid Administration via (None)  Medication Administration Whole meds with puree  Compensations Minimize environmental distractions;Slow rate;Small  sips/bites;Multiple dry swallows after each bite/sip  Postural Changes and/or Swallow Maneuvers (None)     CHL IP OTHER RECOMMENDATIONS 12/24/2014  Recommended Consults (None)  Oral Care Recommendations Oral care BID  Other Recommendations Order thickener from pharmacy;Clarify dietary  restrictions     No flowsheet data found.   CHL IP FREQUENCY AND DURATION 12/24/2014  Speech Therapy Frequency (ACUTE ONLY) min 3x week  Treatment Duration 2 weeks     Pertinent Vitals/Pain n/a    SLP Swallow Goals No flowsheet data found.  No flowsheet data found.    CHL IP REASON FOR REFERRAL 12/24/2014  Reason for Referral Objectively evaluate swallowing function     CHL IP ORAL PHASE 12/24/2014  Lips (None)  Tongue (None)  Mucous membranes (None)  Nutritional status (None)  Other (None)  Oxygen therapy (None)  Oral Phase Impaired  Oral - Pudding Teaspoon (None)  Oral - Pudding Cup (None)  Oral - Honey Teaspoon (None)  Oral - Honey Cup (None)  Oral - Honey Syringe (None)  Oral - Nectar Teaspoon (None)  Oral - Nectar Cup (None)  Oral - Nectar Straw (None)  Oral - Nectar Syringe (None)  Oral - Ice Chips (None)  Oral - Thin Teaspoon (None)  Oral - Thin Cup (None)  Oral - Thin Straw (None)  Oral - Thin Syringe (None)  Oral - Puree (None)  Oral - Mechanical Soft (None)  Oral - Regular (None)  Oral - Multi-consistency (None)  Oral - Pill (None)  Oral Phase - Comment (None)      CHL IP PHARYNGEAL PHASE 12/24/2014  Pharyngeal Phase Impaired  Pharyngeal - Pudding Teaspoon (None)  Penetration/Aspiration details (pudding teaspoon) (None)  Pharyngeal - Pudding Cup (None)  Penetration/Aspiration details (pudding cup) (None)  Pharyngeal - Honey Teaspoon (None)  Penetration/Aspiration details (honey teaspoon) (None)   Pharyngeal - Honey Cup (None)  Penetration/Aspiration details (honey cup) (None)  Pharyngeal - Honey Syringe (None)  Penetration/Aspiration details (honey syringe) (None)  Pharyngeal - Nectar Teaspoon (None)  Penetration/Aspiration details (nectar teaspoon) (None)  Pharyngeal - Nectar Cup (None)  Penetration/Aspiration details (nectar cup) (None)  Pharyngeal - Nectar Straw (None)  Penetration/Aspiration details (nectar straw) (None)  Pharyngeal - Nectar Syringe (None)  Penetration/Aspiration details (nectar syringe) (None)  Pharyngeal - Ice Chips (None)  Penetration/Aspiration details (ice chips) (None)  Pharyngeal - Thin Teaspoon (None)  Penetration/Aspiration details (thin teaspoon) (None)  Pharyngeal - Thin Cup (None)  Penetration/Aspiration details (thin cup) (None)  Pharyngeal - Thin Straw (None)  Penetration/Aspiration details (thin straw) (None)  Pharyngeal - Thin Syringe (None)  Penetration/Aspiration details (thin syringe') (None)  Pharyngeal - Puree (None)  Penetration/Aspiration details (puree) (None)  Pharyngeal - Mechanical Soft (None)  Penetration/Aspiration details (mechanical soft) (None)  Pharyngeal - Regular (None)  Penetration/Aspiration details (regular) (None)  Pharyngeal - Multi-consistency (None)  Penetration/Aspiration details (multi-consistency) (None)  Pharyngeal - Pill (None)  Penetration/Aspiration details (pill) (None)  Pharyngeal Comment (None)      CHL IP CERVICAL ESOPHAGEAL PHASE 12/24/2014  Cervical Esophageal Phase WFL  Pudding Teaspoon (None)  Pudding Cup (None)  Honey Teaspoon (None)  Honey Cup (None)  Honey Straw (None)  Nectar Teaspoon (None)  Nectar Cup (None)  Nectar Straw (None)  Nectar Sippy Cup (None)  Thin Teaspoon (None)  Thin Cup (None)  Thin Straw (None)  Thin [place] Cup (None)  Cervical Esophageal Comment (None)    No flowsheet data found.  Kern Reap, MA, CCC-SLP 12/24/2014, 10:32 AM [phone]   Medications: I have reviewed the patient's current  medications. Scheduled Meds: . amitriptyline  25 mg Oral QHS  . amLODipine  10 mg Oral Daily  . antiseptic oral rinse  7 mL Mouth Rinse QID  . benztropine  2 mg Oral Daily  . chlorhexidine gluconate  15 mL Mouth Rinse BID  . clonazePAM  0.25 mg Oral QHS  . cloZAPine  200 mg Oral QHS  . dextromethorphan-guaiFENesin  1 tablet Oral BID  . donepezil  10 mg Oral QHS  . enoxaparin (LOVENOX) injection  40 mg Subcutaneous Q24H  . feeding supplement (ENSURE ENLIVE)  237 mL Oral BID BM  . haloperidol  10 mg Oral BID  . insulin aspart  0-15 Units Subcutaneous 6 times per day  . insulin glargine  12 Units Subcutaneous QHS  . memantine  10 mg Oral BID  . multivitamin  5 mL Oral Daily  . pantoprazole  40 mg Oral QHS  . ramipril  20 mg Oral Daily  . [place]   Oral Once  . senna-docusate  1 tablet Oral QHS   Continuous Infusions:  PRN Meds:.acetaminophen, albuterol, LORazepam, RESOURCE THICKENUP CLEAR Assessment/Plan:  Sepsis due to acute bronchitis with mucous plugging and lobar collapse complicated by ARDS - Pt currently with normal oxygen saturation on room air. Last CXR on 12/21/14 with bibasilar atelectasis.  -Continue oxygen therapy to keep SpO2>92% -Continue incentive spirometry and flutter valve -Continue albuterol nebulizer Q 4 hr PRN  -Continue mucinex 30-600 mg BID for cough -Continue PT and OT who recommend SNF placement   Hypertension - Currently hypertensive  -Continue ramipril 20 mg daily and amlodipine 10 mg daily  -Hold home lasix 20 mg daily   Chronic Hypokalemia - K normal (3.8) today. Pt at home on lasix.  -Restart home potassium chloride 8 mEq daily   Insulin-dependent Type 2 DM with neuropathy - Last A1c 6.8 on 12/23/14.  Pt at home on Novolog 70/30 mix 25 U BID, Lantus 15 U daily, and actos 40 mg daily   -Increase Lantus 12 U daily to 15 U daily  -Continue moderate SSI with meals  -Monitor CBG's before meals and at bedtime -Restart home gabapentin 600  mg TID and continue amitriptyline 25 mg daily for chronic peripheral neuropathy  Schizophrenia - Currently with stable mood  -Continue benztropine 2 mg daily, clozapine 200 mg daily, clonazepam 0.25 mg daily, and haldol 10 mg BID   Alzheimer's Dementia - Currently at baseline mental status.  -Continue home aricept 10 mg daily and namenda 10 mg BID    Chronic Normocytic Anemia - Hg 7.9 below baseline 9-11 with no active bleeding in setting of recent severe sepsis. Anemia panel on 12/23/14 consistent with ACD of unclear etiology.  -Discontinue home ferrous sulfate as pt not iron-deficient  -Monitor CBC and transfuse if Hg<7 -Obtain FOBT  GERD - Currently with no acid reflux symptoms. Pt at home on nexium 40 mg daily.  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  -Restart home aspirin 325 mg daily  Allergic Rhinitis - Currently  with stable symptoms  -Restart home flonase 2 sprays in each nostril PRN -Restart home xyzal 5 mg daily    Diet: Dysphagia 2 with honey thick fluids DVT PPx: Lovenox  Code: Full   Dispo: Disposition is deferred at this time, awaiting improvement of current medical problems.  Anticipated discharge in approximately  2-4 day(s).   The patient does not have a current PCP (Provider Not In System) and does need an [facility] hospital follow-up appointment after discharge.  The patient does have transportation limitations that hinder transportation to clinic appointments.  .Services Needed at time of discharge: Y = Yes, Blank = No PT:   OT:   RN:   Equipment:   Other:     LOS: 16 days   Juluis Mire, MD 12/25/2014, 2:58 PM

## 2014-12-26 ENCOUNTER — Inpatient Hospital Stay (HOSPITAL_COMMUNITY): Payer: Medicare Other

## 2014-12-26 LAB — BASIC METABOLIC PANEL
ANION GAP: 9 (ref 5–15)
BUN: 16 mg/dL (ref 6–20)
CALCIUM: 9 mg/dL (ref 8.9–10.3)
CO2: 22 mmol/L (ref 22–32)
Chloride: 110 mmol/L (ref 101–111)
Creatinine, Ser: 0.84 mg/dL (ref 0.44–1.00)
Glucose, Bld: 138 mg/dL — ABNORMAL HIGH (ref 65–99)
POTASSIUM: 4 mmol/L (ref 3.5–5.1)
Sodium: 141 mmol/L (ref 135–145)

## 2014-12-26 LAB — CBC
HCT: 23.9 % — ABNORMAL LOW (ref 36.0–46.0)
HEMOGLOBIN: 7.3 g/dL — AB (ref 12.0–15.0)
MCH: 30.2 pg (ref 26.0–34.0)
MCHC: 30.5 g/dL (ref 30.0–36.0)
MCV: 98.8 fL (ref 78.0–100.0)
Platelets: 429 10*3/uL — ABNORMAL HIGH (ref 150–400)
RBC: 2.42 MIL/uL — AB (ref 3.87–5.11)
RDW: 16 % — ABNORMAL HIGH (ref 11.5–15.5)
WBC: 9.1 10*3/uL (ref 4.0–10.5)

## 2014-12-26 LAB — GLUCOSE, CAPILLARY
GLUCOSE-CAPILLARY: 122 mg/dL — AB (ref 65–99)
GLUCOSE-CAPILLARY: 127 mg/dL — AB (ref 65–99)
GLUCOSE-CAPILLARY: 113 mg/dL — AB (ref 65–99)

## 2014-12-26 MED ORDER — ACETAMINOPHEN 500 MG PO TABS: 500.0000 mg | ORAL_TABLET | Freq: Four times a day (QID) | ORAL | Status: DC | PRN

## 2014-12-26 NOTE — Progress Notes (Signed)
Pt having difficulty waking up this morning. Opens eyes to pain only. Pt was able to tell me her name, but then quickly falls back to sleep. P 104 R 20 BP 153/55. O2 saturation is 96% via RA. MD notified. Will continue to monitor.

## 2014-12-26 NOTE — Progress Notes (Signed)
Pt still lethargic, sleepy, easily arousable and will speak to you but quickly go back to sleep.

## 2014-12-26 NOTE — Progress Notes (Addendum)
Physical Therapy Treatment Patient Details Name: Akiba Melfi MRN: [id number] DOB: 1940-06-08 Today's Date: 12/26/2014    History of Present Illness  74 year-old lady with history of CAD, ypertension, type II diabetes, hyperlipidemia, and schizophrenia who presented with weakness, cough, fever, and diarrhea. Chest x-ray consistent with CAP. Intubated 8/20. Extubated 8/30.     PT Comments    Changing positions seemed to help Ms. Kurdziel arouse more, and she was able to transfer to the [facility] with +2 assist for safety; Heavy mod assist to power up to stand during standing trials  Follow Up Recommendations  SNF     Equipment Recommendations   (TBA next venue)    Recommendations for Other Services       Precautions / Restrictions Precautions Precautions: Fall    Mobility  Bed Mobility Overal bed mobility: Needs Assistance Bed Mobility: Supine to Sit;Sit to Supine     Supine to sit: Max assist Sit to supine: +2 for safety/equipment;Max assist   General bed mobility comments: Max assist, especially to initiate; Used bed pad to roll and assist in clearing LEs from EOB; max assist to elevate trunk to upright  Transfers Overall transfer level: Needs assistance Equipment used: 1 person hand held assist (second person for safety, steadying BSC) Transfers: Sit to/from [facility] Sit to Stand: Mod assist Stand pivot transfers: Mod assist       General transfer comment: Pt needed assist to power up.  Once up, posterior bias needing assist to lean forward and for postural stability.  Pt able to take a few steps around to chair with notable incr in effort to move left LE to step around.  Needed assit to control descent into chair as well.  transferred bed to [facility], then back to bed; paused in standing for hygeine  Ambulation/Gait                 Stairs            Wheelchair Mobility    Modified Rankin (Stroke Patients Only)       Balance      Sitting balance-Leahy Scale: Fair       Standing balance-Leahy Scale: Zero                      Cognition Arousal/Alertness: Awake/alert Behavior During Therapy: Flat affect;Anxious Overall Cognitive Status: No family/caregiver present to determine baseline cognitive functioning                      Exercises      General Comments        Pertinent Vitals/Pain Pain Assessment: No/denies pain    Home Living                      Prior Function            PT Goals (current goals can now be found in the care plan section) Acute Rehab PT Goals Patient Stated Goal: to go home PT Goal Formulation: With patient Time For Goal Achievement: 11/30/14 Potential to Achieve Goals: Good Progress towards PT goals: Progressing toward goals    Frequency  Min 3X/week    PT Plan Current plan remains appropriate    Co-evaluation             End of Session Equipment Utilized During Treatment: Gait belt Activity Tolerance: Patient limited by lethargy Patient left: in bed;with call bell/phone within reach;with bed alarm set (  bed in semi-chair position)     Time: 5809-9833 PT Time Calculation (min) (ACUTE ONLY): 19 min  Charges:  $Therapeutic Activities -- 8-22 minutes                   G Codes:      Roney Marion Hamff 12/26/2014, 4:03 PM  Roney Marion, [place]  Acute Rehabilitation Services Pager [phone] Office [phone]

## 2014-12-26 NOTE — Progress Notes (Signed)
Pt back from MRI, eyes closed but easily arousable.sleepy, lethargic. [id number]

## 2014-12-26 NOTE — Progress Notes (Signed)
SLP Cancellation Note  Patient Details Name: [name] MRN: [id number] DOB: 1940/09/26   Cancelled treatment:        Unable to arouse pt for dysphagia therapy. Discussed with RN not to attempt lunch/dinner/meds unless adequately alert. Will continue efforts.   Houston Siren 12/26/2014, 9:37 AM   Orbie Pyo Colvin Caroli.Ed [employer] [phone]

## 2014-12-26 NOTE — Discharge Summary (Signed)
Name: [name] MRN: [id number] DOB: [birth year] 74 y.o. PCP: Provider Not In System  Date of Admission: 12/09/2014  1:09 PM Date of Discharge: 12/26/2014 Attending Physician: Oval Linsey, MD  Discharge Diagnosis: Active Problems:   Diabetes mellitus, type 2   Dementia   Schizophrenia, unspecified type   Essential hypertension   CAP (community acquired pneumonia)   Acute respiratory failure with hypoxia   ARDS (adult respiratory distress syndrome)   Acute encephalopathy   Hypernatremia   Hypokalemia   Sepsis   Anemia  Discharge Medications:   Medication List    ASK your doctor about these medications        acetaminophen 500 MG tablet  Commonly known as:  TYLENOL  Take 500-1,000 mg by mouth 3 (three) times daily as needed. For back pain     amitriptyline 25 MG tablet  Commonly known as:  ELAVIL  Take 1 tablet (25 mg total) by mouth at bedtime.     amLODipine 10 MG tablet  Commonly known as:  NORVASC  Take 1 tablet (10 mg total) by mouth daily.     aspirin 325 MG tablet  Take 325 mg by mouth daily.     benztropine 2 MG tablet  Commonly known as:  COGENTIN  Take 2 mg by mouth every morning.     clonazePAM 0.5 MG tablet  Commonly known as:  KLONOPIN  Take 0.25 mg by mouth at bedtime.     cloZAPine 100 MG tablet  Commonly known as:  CLOZARIL  Take 200 mg by mouth at bedtime.     donepezil 10 MG tablet  Commonly known as:  ARICEPT  Take 10 mg by mouth at bedtime.     ferrous sulfate 325 (65 FE) MG tablet  Take 325 mg by mouth daily with breakfast.     fluticasone 50 MCG/ACT nasal spray  Commonly known as:  FLONASE  Place 2 sprays into both nostrils daily as needed for allergies.     furosemide 40 MG tablet  Commonly known as:  LASIX  Take 0.5 tablets (20 mg total) by mouth daily. For systolic blood pressure management & fluid accumulation.     gabapentin 600 MG tablet  Commonly known as:  NEURONTIN  Take 1 tablet (600 mg total) by mouth 3  (three) times daily.     haloperidol 10 MG tablet  Commonly known as:  HALDOL  Take 10 mg by mouth 2 (two) times daily.     insulin aspart protamine- aspart (70-30) 100 UNIT/ML injection  Commonly known as:  NOVOLOG MIX 70/30  Inject 25 Units into the skin 2 (two) times daily.     insulin glargine 100 UNIT/ML injection  Commonly known as:  LANTUS SOLOSTAR  Inject 15 Units into the skin at bedtime.     levocetirizine 5 MG tablet  Commonly known as:  XYZAL  Take 5 mg by mouth every evening.     memantine 10 MG tablet  Commonly known as:  NAMENDA  Take 1 tablet (10 mg total) by mouth 2 (two) times daily.     multivitamin,tx-minerals tablet  Take 1 tablet by mouth daily.     NEXIUM 40 MG capsule  Generic drug:  esomeprazole  Take 40 mg by mouth daily.     pioglitazone 30 MG tablet  Commonly known as:  ACTOS  Take 1 tablet (30 mg total) by mouth daily.     potassium chloride 8 MEQ tablet  Commonly known as:  KLOR-CON  Take 1 tablet (8 mEq total) by mouth daily.     ramipril 10 MG capsule  Commonly known as:  ALTACE  Take 1 capsule (10 mg total) by mouth daily.     rosuvastatin 20 MG tablet  Commonly known as:  CRESTOR  Take 1 tablet (20 mg total) by mouth daily.        Disposition and follow-up:   Ms.[name] was discharged from [facility] in Stable condition.  At the hospital follow up visit please address:  1.  Please assess her respiratory status, cough, mucous production, SOB. Stable on discharge, sating upper 90s on RA. Continued to have productive cough at time of discharge.   We are stopping many of her psych medications due to being off them for a prolonged period in the ICU and having severe sedation upon restarting them. We are stopping Klonopin and Gabapentin. Changing Haldol to prn agitation instead of scheduled. Decreasing dose of Clozapine to 100 mg daily, titrate up as needed.  Continuing other psych medications. Needs f/u with  Psychiatry for adjustment of her medications.   Increased dose of Ramipril to 20 mg daily, continue Amiodarone 10 mg. Stopping Lasix, not volume overloaded during hospitalization. Add back as needed.   Assess dysphagia. Required honey thick liquids during hospitalization 2/2 dysphagia.   Stopped home Iron. Anemia panel on 12/23/14 consistent with ACD of unclear etiology.  2.  Labs / imaging needed at time of follow-up: None  3.  Pending labs/ test needing follow-up: None  Follow-up Appointments:   Discharge Instructions:   Consultations:   Dr. Baltazar Apo, PCCM  Procedures Performed:  Dg Chest 2 View  12/26/2014   CLINICAL DATA:  Altered mental status and cough.  EXAM: CHEST  2 VIEW  COMPARISON:  12/21/2014 and prior radiographs  FINDINGS: The cardiomediastinal silhouette is unchanged.  There has been removal of a right IJ central venous catheter.  Mild pulmonary vasculature congestion and possible mild interstitial edema again noted.  Bibasilar atelectasis again noted, improved on the left.  There is no evidence of pneumothorax.  IMPRESSION: Right IJ central venous catheter removal and improved left basilar aeration, otherwise unchanged exam. Pulmonary vascular congestion and possible mild interstitial edema again noted.   Electronically Signed   By: Margarette Canada M.D.   On: 12/26/2014 08:47   Ct Head Wo Contrast  12/26/2014   CLINICAL DATA:  74 year old female with altered mental status, difficult to arouse this morning. Initial encounter.  EXAM: CT HEAD WITHOUT CONTRAST  TECHNIQUE: Contiguous axial images were obtained from the base of the skull through the vertex without intravenous contrast.  COMPARISON:  12/10/2014 and earlier.  FINDINGS: Mild mastoid effusions are new. Negative nasopharynx. Paranasal sinuses remain clear. No acute osseous abnormality identified. Stable orbit and scalp soft tissues.  Calcified atherosclerosis at the skull base. Cerebral volume is stable and within normal  limits for age. No ventriculomegaly. No midline shift, mass effect, or evidence of intracranial mass lesion. No acute intracranial hemorrhage identified. No suspicious intracranial vascular hyperdensity. Stable occasional white matter hypodensity (left external capsule). No cortically based acute infarct identified.  IMPRESSION: 1. Stable noncontrast CT appearance of the brain since August. 2. New mild mastoid effusions.   Electronically Signed   By: Genevie Ann M.D.   On: 12/26/2014 08:46   Ct Head Wo Contrast  12/10/2014   CLINICAL DATA:  Acute encephalopathy.  EXAM: CT HEAD WITHOUT CONTRAST  TECHNIQUE: Contiguous axial images were obtained from the base  of the skull through the vertex without intravenous contrast.  COMPARISON:  12/25/2010  FINDINGS: There is no evidence of intracranial hemorrhage, brain edema, or other signs of acute infarction. There is no evidence of intracranial mass lesion or mass effect. No abnormal extraaxial fluid collections are identified.  Mild cerebral atrophy is stable. Ventricles are stable in size. No skull abnormality identified.  IMPRESSION: No acute intracranial abnormality.  Stable mild cerebral atrophy.   Electronically Signed   By: Earle Gell M.D.   On: 12/10/2014 07:08   Dg Chest Port 1 View  12/21/2014   CLINICAL DATA:  Acute respiratory failure  EXAM: PORTABLE CHEST - 1 VIEW  COMPARISON:  12/20/2014  FINDINGS: Endotracheal tube and NG tube removed. Right jugular catheter tip in the right atrium unchanged. No pneumothorax  Progression of bibasilar atelectasis left greater than right. Small left effusion. Mild vascular congestion without edema.  IMPRESSION: Progression of bibasilar atelectasis left greater than right following extubation. Central venous catheter tip in the right atrium unchanged.   Electronically Signed   By: Franchot Gallo M.D.   On: 12/21/2014 07:31   Dg Chest Port 1 View  12/20/2014   CLINICAL DATA:  74 year old female with acute respiratory failure.  Subsequent encounter.  EXAM: PORTABLE CHEST - 1 VIEW  COMPARISON:  12/19/2014.  FINDINGS: Rotation to the right. Endotracheal tube tip 3.5 cm above the carina. Right central line tip caval atrial junction. Nasogastric tube courses below the diaphragm. Tip is not included on the present exam.  Slightly improved asymmetric airspace disease now more notable on left may represent resolving pulmonary edema or infiltrate.  Cardiomegaly.  No gross pneumothorax.  Elevated right hemidiaphragm unchanged.  IMPRESSION: Slight improved aeration right lung. Residual asymmetric airspace disease greater on left may represent resolving pulmonary edema or infiltrate.  Rotated exam with cardiomegaly.   Electronically Signed   By: Genia Del M.D.   On: 12/20/2014 07:50   Dg Chest Port 1 View  12/19/2014   CLINICAL DATA:  Endotracheal tube repositioning.  Initial encounter.  EXAM: PORTABLE CHEST - 1 VIEW  COMPARISON:  Chest radiograph performed 12/18/2014  FINDINGS: The patient's endotracheal tube is seen ending 5-6 cm above the carina. An enteric tube is noted extending below the diaphragm. A right IJ line is noted ending about the cavoatrial junction.  The lungs are hypoexpanded. Vascular crowding and vascular congestion are seen. Bibasilar airspace opacities could reflect mild interstitial edema or pneumonia. A small left pleural effusion is suspected. No pneumothorax is seen.  The cardiomediastinal silhouette is mildly enlarged. No acute osseous abnormalities are identified.  IMPRESSION: 1. Endotracheal tube seen ending 5-6 cm above the carina. 2. Lungs hypoexpanded. Vascular congestion and mild cardiomegaly noted. Bibasilar airspace opacities could reflect mild interstitial edema or pneumonia. Suspect small left pleural effusion.   Electronically Signed   By: Garald Balding M.D.   On: 12/19/2014 03:59   Dg Chest Port 1 View  12/18/2014   CLINICAL DATA:  Endotracheal tube  EXAM: PORTABLE CHEST - 1 VIEW  COMPARISON:   12/18/2014 endotracheal tube in stable position approximately 4 cm above the carina. Right central line and NG tube are unchanged. There is cardiomegaly. Consolidation in the left lower lobe. Right basilar atelectasis or infiltrate as well. Suspect small left effusion.  FINDINGS: The heart size and mediastinal contours are within normal limits. Both lungs are clear. The visualized skeletal structures are unremarkable.  IMPRESSION: Consolidation in the left lower lobe concerning for pneumonia. Right basilar atelectasis  or infiltrate.   Electronically Signed   By: Rolm Baptise M.D.   On: 12/18/2014 14:16   Dg Chest Port 1 View  12/18/2014   CLINICAL DATA:  Respiratory failure  EXAM: PORTABLE CHEST - 1 VIEW  COMPARISON:  12/16/2014  FINDINGS: Endotracheal tube terminates 5.5 cm above carina. Nasogastric extends beyond the inferior aspect of the film. Right internal jugular line tip at low SVC. Midline trachea. Cardiomegaly accentuated by AP portable technique. Improved to resolved left pleural effusion. Right hemidiaphragm elevation. No pneumothorax. Improved interstitial edema, with mild pulmonary venous congestion remaining. Patchy left worse than right airspace disease is improved.  IMPRESSION: Improved aeration, with decreased interstitial edema and patchy left greater than right airspace disease.  Small left pleural effusion is improved to resolved.   Electronically Signed   By: Abigail Miyamoto M.D.   On: 12/18/2014 09:38   Dg Chest Port 1 View  12/16/2014   CLINICAL DATA:  Respiratory failure.  EXAM: PORTABLE CHEST - 1 VIEW  COMPARISON:  Chest x-ray dated 12/15/2014.  FINDINGS: Endotracheal tube remains well positioned with tip just above the level of the carina. Right IJ central line remains well positioned with tip in the superior vena cava near the cavoatrial junction. Enteric tube remains adequately positioned in the stomach with tip below the lower limits of this exam.  Cardiomegaly appears grossly  stable. Again noted is central pulmonary vascular congestion and bilateral airspace opacities most suggestive of pulmonary edema pattern, left greater than right, unchanged. An additional dense opacity at the left lung base is probably atelectasis. Suspect small left pleural effusion. No new lung findings. No pneumothorax.  IMPRESSION: 1. Persistent central pulmonary vascular congestion and bilateral pulmonary edema pattern, left greater than right, without significant interval change.  2. Additional dense opacity at the left lung base is also unchanged and could represent compressive atelectasis or pneumonia, as also suggested on the previous chest x-ray report.  3. Probable small left pleural effusion, unchanged.  4. Cardiomegaly, stable.  5. Tubes and lines are stable.  6. No new findings.   Electronically Signed   By: Franki Cabot M.D.   On: 12/16/2014 07:18   Dg Chest Port 1 View  12/15/2014   CLINICAL DATA:  Encounter for central line placement.  EXAM: PORTABLE CHEST - 1 VIEW  COMPARISON:  December 14, 2014.  FINDINGS: Stable cardiomegaly. Endotracheal and nasogastric tubes are in grossly good position. Interval placement of right internal jugular catheter line with distal tip at the expected position of the cavoatrial junction. Stable central pulmonary vascular congestion is noted. No pneumothorax is noted. Left basilar opacity is noted concerning for pneumonia or atelectasis with associated pleural effusion. Probable mild bilateral perihilar edema is noted. Bony thorax is intact.  IMPRESSION: Interval placement of right internal jugular catheter with distal tip in expected position of cavoatrial junction. No pneumothorax is noted. Stable central pulmonary vascular congestion and bilateral perihilar edema is noted. Left basilar pneumonia or subsegmental atelectasis is noted with probable associated pleural effusion.   Electronically Signed   By: Marijo Conception, M.D.   On: 12/15/2014 13:13   Dg Chest Port  1 View  12/14/2014   CLINICAL DATA:  Acute respiratory failure.  EXAM: PORTABLE CHEST - 1 VIEW  COMPARISON:  December 13, 2014.  FINDINGS: Stable cardiomegaly. Endotracheal tube is in grossly good position with distal tip 4 cm above the carina. Nasogastric tube is seen entering the stomach. Right lung is clear. Stable diffuse left lung opacity  is noted most consistent with pneumonia or edema with possible small associated pleural effusion. No pneumothorax is noted. Bony thorax is intact.  IMPRESSION: Endotracheal and nasogastric tubes in grossly good position. Stable diffuse lung opacity is noted concerning for pneumonia or edema with probable associated pleural effusion.   Electronically Signed   By: Marijo Conception, M.D.   On: 12/14/2014 07:26   Dg Chest Port 1 View  12/13/2014   CLINICAL DATA:  Acute respiratory failure.  EXAM: PORTABLE CHEST - 1 VIEW  COMPARISON:  12/12/2014.  FINDINGS: The tracheal tube tip noted approximately 1 cm above the carina, proximal repositioning of 2 cm suggested. NG tube in stable position with tip below left hemidiaphragm. Stable cardiomegaly. Persistent diffuse left lung infiltrate. Progressive mild right base infiltrate. These findings are consistent with bilateral pneumonia and/or pulmonary edema. Small left pleural effusion. No pneumothorax.  IMPRESSION: 1. Endotracheal tube tip 1 cm above the carina. Proximal repositioning of 2 cm suggested. NG tube in stable position.  2. Persistent diffuse left lung infiltrate. Progressive right lower lobe infiltrate. These findings are consistent with bilateral pneumonia and/or pulmonary edema. Small left pleural effusions.  3.  Persistent prominent cardiomegaly.  These results will be called to the ordering clinician or representative by the Radiologist Assistant, and communication documented in the PACS or zVision Dashboard.   Electronically Signed   By: Marcello Moores  Register   On: 12/13/2014 08:11   Dg Chest Port 1 View  12/12/2014    CLINICAL DATA:  Acute respiratory failure.  EXAM: PORTABLE CHEST - 1 VIEW  COMPARISON:  None.  FINDINGS: Endotracheal tube and NG tube in good anatomic position. Cardiomegaly. Persistent left lung infiltrate. Mild right base infiltrate. These findings may be secondary to pulmonary edema asymmetrically worse on the left and/or pneumonia. Small left pleural effusion. No pneumothorax.  IMPRESSION: 1. Lines and tubes in stable position. 2. Cardiomegaly. Persistent diffuse left lung infiltrate. Mild right base infiltrate. These findings may be secondary to congestive heart failure with pulmonary edema. Bilateral pneumonia, particularly on the left cannot be excluded. Small left pleural effusion.   Electronically Signed   By: Marcello Moores  Register   On: 12/12/2014 07:50   Dg Chest Port 1 View  12/10/2014   CLINICAL DATA:  Acute onset of respiratory failure. Initial encounter.  EXAM: PORTABLE CHEST - 1 VIEW  COMPARISON:  Chest radiograph performed earlier today at 5:11 a.m.  FINDINGS: The patient's endotracheal tube is seen ending 2 cm above the carina. An enteric tube is noted extending below the diaphragm.  Worsening left-sided airspace opacification is compatible with worsening left-sided pneumonia. The right lung appears clear. No pleural effusion or pneumothorax is seen.  The cardiomediastinal silhouette is mildly enlarged. No acute osseous abnormalities are identified.  IMPRESSION: 1. Endotracheal tube seen ending 2 cm above the carina. 2. Worsening diffuse left-sided pneumonia noted.   Electronically Signed   By: Garald Balding M.D.   On: 12/10/2014 23:10   Dg Chest Port 1 View  12/10/2014   CLINICAL DATA:  Acute respiratory failure  EXAM: PORTABLE CHEST - 1 VIEW  COMPARISON:  Chest x-ray from yesterday  FINDINGS: Progressive bilateral lung opacity, most dense in the left upper lobe medially. No airspace disease or nodules seen in this location on esophagram 08/26/2013. Mild cardiomegaly. Mediastinal contours are  distorted by rightward rotation.  IMPRESSION: Worsening inflation and increasing bilateral airspace disease consistent with pneumonia.   Electronically Signed   By: Monte Fantasia M.D.   On: 12/10/2014  06:03   Dg Chest Port 1 View  12/09/2014   CLINICAL DATA:  Weakness and cough for 1 month.  EXAM: PORTABLE CHEST - 1 VIEW  COMPARISON:  12/25/2010  FINDINGS: There is extensive airspace disease throughout the left perihilar region and medial left upper lung. Few streaky densities in the right perihilar region. Mild elevation of the right hemidiaphragm. Heart size is within normal limits and stable.  IMPRESSION: Extensive airspace disease in the medial left upper lung and left perihilar region. There are additional densities in the right perihilar region and right lower lung. Findings are concerning for pneumonia.  Followup PA and lateral chest X-ray is recommended in 3-4 weeks following trial of antibiotic therapy to ensure resolution and exclude underlying malignancy.   Electronically Signed   By: Markus Daft M.D.   On: 12/09/2014 14:52   Dg Abd Portable 1v  12/19/2014   CLINICAL DATA:  Gastric tube placement  EXAM: PORTABLE ABDOMEN - 1 VIEW  COMPARISON:  12/10/2014  FINDINGS: The enteric tube extends into the stomach, with proximal port probably within the gastric lumen. Visible bowel gas pattern is unremarkable.  IMPRESSION: Enteric tube extends into the stomach.   Electronically Signed   By: Andreas Newport M.D.   On: 12/19/2014 03:57   Dg Abd Portable 1v  12/10/2014   CLINICAL DATA:  Orogastric tube placement.  Initial encounter.  EXAM: PORTABLE ABDOMEN - 1 VIEW  COMPARISON:  Lumbar spine radiographs performed 12/25/2010  FINDINGS: The patient's enteric tube is noted ending overlying the body of the stomach, with the side port noted about the gastroesophageal junction. This could be advanced perhaps 3 cm, as deemed clinically appropriate.  The visualized bowel gas pattern is grossly unremarkable. Clips  are noted within the right upper quadrant, reflecting prior cholecystectomy. No free intra-abdominal air is seen, though evaluation for free air is limited on a single supine view. No acute osseous abnormalities are identified.  IMPRESSION: 1. Enteric tube noted ending overlying the body of the stomach, with the side port about the gastroesophageal junction. This could be advanced perhaps 3 cm, as deemed clinically appropriate. 2. Unremarkable bowel gas pattern; no free intra-abdominal air seen.   Electronically Signed   By: Garald Balding M.D.   On: 12/10/2014 23:12   Dg Swallowing Func-speech Pathology  12/24/2014    Objective Swallowing Evaluation:    Patient Details  Name: [name] MRN: [id number] Date of Birth: [birth year]  Today's Date: 12/24/2014 Time: SLP Start Time (ACUTE ONLY): 0940-SLP Stop Time (ACUTE ONLY): 1005 SLP Time Calculation (min) (ACUTE ONLY): 25 min  Past Medical History:  Past Medical History  Diagnosis Date  . Anemia, iron deficiency   . Anxiety   . CAD (coronary artery disease)   . Type II or unspecified type diabetes mellitus without mention of  complication, not stated as uncontrolled   . GERD (gastroesophageal reflux disease)   . HTN (hypertension)   . Peripheral neuropathy   . Schizophrenia   . Hyperlipidemia   . Allergic rhinitis   . 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 Dementia   . IBS (irritable bowel syndrome)   . Syncope     EP study 1996 w/atrial tachycardia  . Esophageal motility disorder   . Renal insufficiency    Past Surgical History:  Past Surgical History  Procedure Laterality Date  . Abdominal hysterectomy    . Ptca  1995  . Knee arthroplasty  Right   . Breast biopsy      Negative   HPI:  Other Pertinent Information: 74 year old female with h/o HTN, DM,  schizophrenia, admitted 8/19 w/ working dx of CAP. Developed progressive  airspace disease on CXR and worsening hypoxia on 8/20. Transferred to the  ICU for emergent intubation.  Extubated 8/30 am.   No Data Recorded  Assessment / Plan / Recommendation CHL IP CLINICAL IMPRESSIONS 12/24/2014  Therapy Diagnosis Mild oral phase dysphagia;Moderate pharyngeal phase  dysphagia  Clinical Impression Pt continues to present with a mild oral, moderate  pharyngeal sensorimotor dysphagia characterized by a delay in swallow  initiation, reduced hyolaryngeal elevation, and reduced epiglottic  inversion. This resulted in reduced airway protection and moderate amount  of aspiration x1 of nectar thick liquids prior to swallow initiation which  was not immediately sensed by pt; trace penetration noted on additional  trials of nectar thick liquid when cueing pt to swallow residuals. Mild  residuals noted across consistencies at base of tongue and vallecula which  pt cleared when cued to swallow a 2nd time. Pt did not have dentures which  resulted in mildly prolonged mastication with solid trial but with  adequate pharyngeal clearance, only minimal vallecular residuals. No  penetration/ aspiration noted with trials of honey-thick liquids.  Recommend advancing diet to dysphagia 2 consistency but continue honey  thick liquids by cup, meds whole in puree, full supervision to cue pt to  take small sips and to swallow a 2nd time to clear residuals. Will  continue to follow for diet tolerance/ pt and family education/ consider  advancement or repeat MBS.       CHL IP TREATMENT RECOMMENDATION 12/24/2014  Treatment Recommendations Therapy as outlined in treatment plan below     CHL IP DIET RECOMMENDATION 12/24/2014  SLP Diet Recommendations Dysphagia 2 (Fine chop);Honey  Liquid Administration via (None)  Medication Administration Whole meds with puree  Compensations Minimize environmental distractions;Slow rate;Small  sips/bites;Multiple dry swallows after each bite/sip  Postural Changes and/or Swallow Maneuvers (None)     CHL IP OTHER RECOMMENDATIONS 12/24/2014  Recommended Consults (None)  Oral Care Recommendations Oral care  BID  Other Recommendations Order thickener from pharmacy;Clarify dietary  restrictions     No flowsheet data found.   CHL IP FREQUENCY AND DURATION 12/24/2014  Speech Therapy Frequency (ACUTE ONLY) min 3x week  Treatment Duration 2 weeks     Pertinent Vitals/Pain n/a    SLP Swallow Goals No flowsheet data found.  No flowsheet data found.    CHL IP REASON FOR REFERRAL 12/24/2014  Reason for Referral Objectively evaluate swallowing function     CHL IP ORAL PHASE 12/24/2014  Lips (None)  Tongue (None)  Mucous membranes (None)  Nutritional status (None)  Other (None)  Oxygen therapy (None)  Oral Phase Impaired  Oral - Pudding Teaspoon (None)  Oral - Pudding Cup (None)  Oral - Honey Teaspoon (None)  Oral - Honey Cup (None)  Oral - Honey Syringe (None)  Oral - Nectar Teaspoon (None)  Oral - Nectar Cup (None)  Oral - Nectar Straw (None)  Oral - Nectar Syringe (None)  Oral - Ice Chips (None)  Oral - Thin Teaspoon (None)  Oral - Thin Cup (None)  Oral - Thin Straw (None)  Oral - Thin Syringe (None)  Oral - Puree (None)  Oral - Mechanical Soft (None)  Oral - Regular (None)  Oral - Multi-consistency (None)  Oral - Pill (None)  Oral Phase - Comment (None)  CHL IP PHARYNGEAL PHASE 12/24/2014  Pharyngeal Phase Impaired  Pharyngeal - Pudding Teaspoon (None)  Penetration/Aspiration details (pudding teaspoon) (None)  Pharyngeal - Pudding Cup (None)  Penetration/Aspiration details (pudding cup) (None)  Pharyngeal - Honey Teaspoon (None)  Penetration/Aspiration details (honey teaspoon) (None)  Pharyngeal - Honey Cup (None)  Penetration/Aspiration details (honey cup) (None)  Pharyngeal - Honey Syringe (None)  Penetration/Aspiration details (honey syringe) (None)  Pharyngeal - Nectar Teaspoon (None)  Penetration/Aspiration details (nectar teaspoon) (None)  Pharyngeal - Nectar Cup (None)  Penetration/Aspiration details (nectar cup) (None)  Pharyngeal - Nectar Straw (None)  Penetration/Aspiration details (nectar straw) (None)  Pharyngeal -  Nectar Syringe (None)  Penetration/Aspiration details (nectar syringe) (None)  Pharyngeal - Ice Chips (None)  Penetration/Aspiration details (ice chips) (None)  Pharyngeal - Thin Teaspoon (None)  Penetration/Aspiration details (thin teaspoon) (None)  Pharyngeal - Thin Cup (None)  Penetration/Aspiration details (thin cup) (None)  Pharyngeal - Thin Straw (None)  Penetration/Aspiration details (thin straw) (None)  Pharyngeal - Thin Syringe (None)  Penetration/Aspiration details (thin syringe') (None)  Pharyngeal - Puree (None)  Penetration/Aspiration details (puree) (None)  Pharyngeal - Mechanical Soft (None)  Penetration/Aspiration details (mechanical soft) (None)  Pharyngeal - Regular (None)  Penetration/Aspiration details (regular) (None)  Pharyngeal - Multi-consistency (None)  Penetration/Aspiration details (multi-consistency) (None)  Pharyngeal - Pill (None)  Penetration/Aspiration details (pill) (None)  Pharyngeal Comment (None)      CHL IP CERVICAL ESOPHAGEAL PHASE 12/24/2014  Cervical Esophageal Phase WFL  Pudding Teaspoon (None)  Pudding Cup (None)  Honey Teaspoon (None)  Honey Cup (None)  Honey Straw (None)  Nectar Teaspoon (None)  Nectar Cup (None)  Nectar Straw (None)  Nectar Sippy Cup (None)  Thin Teaspoon (None)  Thin Cup (None)  Thin Straw (None)  Thin Sippy Cup (None)  Cervical Esophageal Comment (None)    No flowsheet data found.         Kern Reap, MA, CCC-SLP 12/24/2014, 10:32 AM [phone]   Dg Swallowing Func-speech Pathology  12/21/2014    Objective Swallowing Evaluation:    Patient Details  Name: [name] MRN: [id number] Date of Birth: 1941-04-19  Today's Date: 12/21/2014 Time: SLP Start Time (ACUTE ONLY): 1315-SLP Stop Time (ACUTE ONLY): 1345 SLP Time Calculation (min) (ACUTE ONLY): 30 min  Past Medical History:  Past Medical History  Diagnosis Date  . Anemia, iron deficiency   . Anxiety   . CAD (coronary artery disease)   . Type II or unspecified type diabetes mellitus without mention of   complication, not stated as uncontrolled   . GERD (gastroesophageal reflux disease)   . HTN (hypertension)   . Peripheral neuropathy   . Schizophrenia   . Hyperlipidemia   . Allergic rhinitis   . 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 Dementia   . IBS (irritable bowel syndrome)   . Syncope     EP study 1996 w/atrial tachycardia  . Esophageal motility disorder   . Renal insufficiency    Past Surgical History:  Past Surgical History  Procedure Laterality Date  . Abdominal hysterectomy    . Ptca  1995  . Knee arthroplasty      Right   . Breast biopsy      Negative   HPI:  Other Pertinent Information: 74 year old female with h/o HTN, DM,  schizophrenia, admitted 8/19 w/ working dx of CAP. Developed progressive  airspace disease on CXR and worsening hypoxia on 8/20. Transferred to the  ICU for emergent  intubation. Extubated 8/30 am.   No Data Recorded  Assessment / Plan / Recommendation CHL IP CLINICAL IMPRESSIONS 12/21/2014  Therapy Diagnosis Mild oral phase dysphagia;Mild pharyngeal phase  dysphagia  Clinical Impression Pt presents with a mild-mod oropharyngeal dysphagia  marked by mild lingual discoordination, mild delay in swallow initiation,  and immediate penetration of nectar-thick liquids - reaches the level of  the vocal folds and subglottal space.  No cough response was elicited.   Suspect inadequate laryngeal closure as source of deficit.  Honey-thick  liquids and purees were swallowed with adequate pharyngeal clearance and  no penetration of airway.  Recommend initiating a dysphagia 1 diet with  honey-thick liquids initially; provide full supervision given MS deficits;  meds whole in puree.  SLP will follow for safety and diet advancement.       CHL IP TREATMENT RECOMMENDATION 12/21/2014  Treatment Recommendations Therapy as outlined in treatment plan below     CHL IP DIET RECOMMENDATION 12/21/2014  SLP Diet Recommendations Dysphagia 1 (Puree)  Liquid Administration via (None)   Medication Administration Whole meds with puree  Compensations Minimize environmental distractions;Slow rate;Small  sips/bites  Postural Changes and/or Swallow Maneuvers (None)     CHL IP OTHER RECOMMENDATIONS 12/21/2014  Recommended Consults (None)  Oral Care Recommendations Oral care BID  Other Recommendations Order thickener from pharmacy     No flowsheet data found.   CHL IP FREQUENCY AND DURATION 12/21/2014  Speech Therapy Frequency (ACUTE ONLY) min 3x week  Treatment Duration 2 weeks         SLP Swallow Goals No flowsheet data found.  No flowsheet data found.    CHL IP REASON FOR REFERRAL 12/21/2014  Reason for Referral Objectively evaluate swallowing function     CHL IP ORAL PHASE 12/21/2014  Lips (None)  Tongue (None)  Mucous membranes (None)  Nutritional status (None)  Other (None)  Oxygen therapy (None)  Oral Phase Impaired  Oral - Pudding Teaspoon (None)  Oral - Pudding Cup (None)  Oral - Honey Teaspoon (None)  Oral - Honey Cup (None)  Oral - Honey Syringe (None)  Oral - Nectar Teaspoon (None)  Oral - Nectar Cup (None)  Oral - Nectar Straw (None)  Oral - Nectar Syringe (None)  Oral - Ice Chips (None)  Oral - Thin Teaspoon (None)  Oral - Thin Cup (None)  Oral - Thin Straw (None)  Oral - Thin Syringe (None)  Oral - Puree (None)  Oral - Mechanical Soft (None)  Oral - Regular (None)  Oral - Multi-consistency (None)  Oral - Pill (None)  Oral Phase - Comment (None)      CHL IP PHARYNGEAL PHASE 12/21/2014  Pharyngeal Phase Impaired  Pharyngeal - Pudding Teaspoon (None)  Penetration/Aspiration details (pudding teaspoon) (None)  Pharyngeal - Pudding Cup (None)  Penetration/Aspiration details (pudding cup) (None)  Pharyngeal - Honey Teaspoon (None)  Penetration/Aspiration details (honey teaspoon) (None)  Pharyngeal - Honey Cup (None)  Penetration/Aspiration details (honey cup) (None)  Pharyngeal - Honey Syringe (None)  Penetration/Aspiration details (honey syringe) (None)  Pharyngeal - Nectar Teaspoon (None)   Penetration/Aspiration details (nectar teaspoon) (None)  Pharyngeal - Nectar Cup (None) Penetration/Aspiration details (nectar cup)  (None)  Pharyngeal - Nectar Straw (None)  Penetration/Aspiration details (nectar straw) (None)  Pharyngeal - Nectar Syringe (None)  Penetration/Aspiration details (nectar syringe) (None)  Pharyngeal - Ice Chips (None)  Penetration/Aspiration details (ice chips) (None)  Pharyngeal - Thin Teaspoon (None)  Penetration/Aspiration details (thin teaspoon) (None)  Pharyngeal - Thin Cup (None)  Penetration/Aspiration details (thin cup) (None)  Pharyngeal - Thin Straw (None)  Penetration/Aspiration details (thin straw) (None)  Pharyngeal - Thin Syringe (None)  Penetration/Aspiration details (thin syringe') (None)  Pharyngeal - Puree (None)  Penetration/Aspiration details (puree) (None)  Pharyngeal - Mechanical Soft (None)  Penetration/Aspiration details (mechanical soft) (None)  Pharyngeal - Regular (None)  Penetration/Aspiration details (regular) (None)  Pharyngeal - Multi-consistency (None)  Penetration/Aspiration details (multi-consistency) (None)  Pharyngeal - Pill (None)  Penetration/Aspiration details (pill) (None)  Pharyngeal Comment (None)      CHL IP CERVICAL ESOPHAGEAL PHASE 12/21/2014  Cervical Esophageal Phase (No Data)  Pudding Teaspoon (None)  Pudding Cup (None)  Honey Teaspoon (None)  Honey Cup (None)  Honey Straw (None)  Nectar Teaspoon (None)  Nectar Cup (None)  Nectar Straw (None)  Nectar Sippy Cup (None)  Thin Teaspoon (None)  Thin Cup (None)  Thin Straw (None)  Thin Sippy Cup (None)  Cervical Esophageal Comment (None)    No flowsheet data found.         Juan Quam Laurice 12/21/2014, 2:10 PM Estill Bamberg L. [place], [place] CCC/SLP Pager [phone]    Admission HPI: Ms. Conley is a 74 year-old lady with history of CAD (specifics unknown), hypertension, type II diabetes, hyperlipidemia, and schizophrenia who presented with weakness, cough, fever, and diarrhea for the last week. She  says she hasn't eaten much over the last two days but is hungry now. She lives in an apartment by herself and has had difficulty getting around because her legs have felt weak but she says she hasn't fallen. She denies chest pain, dysuria, confusion, or pain elsewhere.  In the emergency department, she was febrile to 102, tachycardic to 130s, pressures stable at 150s. She has a leukocytosis of 19 and left upper lobe consolidation was seen on chest x-ray. She was started on ceftriaxone and azithromycin for community acquired pneumonia, given 2L bolus of IV fluids, and admitted.  Hospital Course by problem list: Active Problems:   Diabetes mellitus, type 2   Dementia   Schizophrenia, unspecified type   Essential hypertension   CAP (community acquired pneumonia)   Acute respiratory failure with hypoxia   ARDS (adult respiratory distress syndrome)   Acute encephalopathy   Hypernatremia   Hypokalemia   Sepsis   Anemia   Sepsis due to acute bronchitis with mucous plugging and lobar collapse complicated by ARDS:  Patient was initially admitted for CAP. She was febrile, dyspneic with a productive cough, WBC of 19 and a LUL consolidation on CXR. No recent hospitalizations. Started on Ceftriaxone and Azithromycin with 2L bolus of NS. Lactate went from 1 to 3.5 within 5 hours. Resolved to 1.9 with IVFs. Patient became more lethargic and somnolent overnight. ABG showed 7.39/33.5/57.4/19.9, significant for hypoxic respiratory failure with repeat CXR showing questionable worsening of right perihilar consolidation but no changes consistent with aspiration. CT head was negative. Remained hemodynamically stable sating upper 90s on 4L [place]. Overnight, she began to desat on [place] and was switched to NRB. Repeat ABG was largely unchanged from previous but she was failing to maintain her O2 sats on NRB with increased work of breathing using accessory muscles. CXR with progressive airspace disease concerning for ARDS and  PCCM was consulted. She was emergently intubated, started on vanc and transferred to the ICU.   She was in the ICU on a ventilator from 8/20 to 9/1. She received 8 day course of Azithromycin and Ceftriaxone. 5 day course of vanc. Urine strep and legionella negative. Blood cultures, urine  culture and tracheal aspirate with no growth. Patient improved with aggressive pulmonary hygiene and deep suctioning. Extubated on 9/1 and transferred to step down unit.   LUL opacity resolved on CXR after intubation with aggressive suctioning. More suggestive of mucous plugging rather than a pneumonia as it would not resolve as quickly if it was a bacterial pneumonia. It is most likely that she had sepsis related to some infectious source, of lung or urine etiology, which resulted in ARDS requiring intubation and mechanical ventilation. She was sating well on 4L O2 [place] but appeared weak and deconditioned following ICU stay. Transferred to general medical ward and slowly weaned off O2 with PT, incentive spirometry and flutter valve. She continued to do well and was satting upper 90s on RA. She did have an unwitnessed fall though did not hit her head and neuro exam following the fall was normal. The next morning she was very lethargic and only responsive to pain. CT head was normal with no acute finding and no subdural hematoma. CXR clear with no signs of aspiration. She had recently been restarted on a lot of her psych medications, many of which were sedating. Received amitripyline, clonazepam, clozapine, aricept, and gabapentin around the same time the night before. Gabapentin and Clonazepam were stopped. Haldol was held. Mental status improved later in the day and her symptoms were believed to be 2/2 sedation from medications.  Stopping her Gabapentin and Clonazepam at discharge. Changing Haldol dose to prn for agitation. Decreasing dose of Clozapine to 100 mg daily to titrate up as outpatient.   Hypertension: She was on  ramipril 10 mg daily and amlodipine 10 mg daily on home regimen as well as lasix 20 mg daily. BP medications were held in the setting of sepsis. Amlodipine was restarted at the end of her ICU stay. Ramipril was added back on the floor was she remained hypertensive. BPs remained elevated and Ramipril was increase to 20 mg daily. Continue current dosing at discharge.   Chronic Hypokalemia: Patient was hypokalemic in the ICU with a K of 3.3. K was repleted. K improved to 4.0. Restarted home potassium chloride 8 mEq daily. She is on Lasix 20 mg daily at home. Will stop Lasix at discharge. Restart as needed outpatient.   Insulin-dependent Type 2 DM with neuropathy: Last A1c 6.8 on 12/23/14. Pt at home on Novolog 70/30 mix 25 U BID, Lantus 15 U daily, and actos 40 mg daily. She was put on Lantus 15 U daily and moderate SSI with meals. Monitored CBG's before meals and at bedtime. Home gabapentin 600 mg TID and amitriptyline 25 mg daily for chronic peripheral neuropathy were held through ICU stay. They were restarted on the floor. Patient was heavily sedated following restarting her psych meds and other sedating medications. Gabapentin was stopped. Holding Gabapentin at discharge. Not complaining of any leg pain. If need to be restarted, she will need to be restarted on low dose and titrated up as needed outpatient.  Schizophrenia: Psych medications were held during ICU stay. Restarted on the floor. Patient was heavily sedated following restarting her home medications. Continued benztropine 2 mg daily and decreased clozapine to 100 mg daily. Stopped clonazepam 0.25 mg daily and held haldol 10 mg BID in setting of sedation. Sedation improved following holding these medications. Continuing benztropine 2 mg daily and clozapine 100 mg daily at discharge. Titrate up as needed. Stopping clonazepam. Haldol prn agitation.   Alzheimer's Dementia: Medications were held during ICU stay. Restarted on the floor. Continued  home  aricept 10 mg daily and namenda 10 mg BID.  Chronic Normocytic Anemia: Patient with Hg 7.1-8.1 during hospitalization, below baseline 9-11. No active bleeding in setting of recent severe sepsis. Anemia panel on 12/23/14 consistent with ACD of unclear etiology. Discontinued home ferrous sulfate as patient was not iron-deficient. FOBT on 8/19 was negative.   GERD: Continued protonix 40 mg daily.  PVD: Patient with 70% left subclavian stenosis. Continued home aspirin 325 mg daily on the floor.   Allergic Rhinitis: Continued home flonase 2 sprays in each nostril PRN and xyzal 5 mg daily on the floor.  Diet: Following ICU stay, patient was on dysphagia 2 with honey thick fluids following SLP evaluation. Remained dysphagic requiring honey-thick liquids.   Discharge Vitals:   BP 152/60 mmHg  Pulse 109  Temp(Src) 98.7 F (37.1 C) (Oral)  Resp 18  Ht 5\' 3"  (1.6 m)  Wt 159 lb 6.3 oz (72.3 kg)  BMI 28.24 kg/m2  SpO2 97%  Discharge Labs:  Results for orders placed or performed during the hospital encounter of 12/09/14 (from the past 24 hour(s))  Glucose, capillary     Status: Abnormal   Collection Time: 12/25/14 10:09 PM  Result Value Ref Range   Glucose-Capillary 166 (H) 65 - 99 mg/dL  Basic metabolic panel     Status: Abnormal   Collection Time: 12/26/14  4:41 AM  Result Value Ref Range   Sodium 141 135 - 145 mmol/L   Potassium 4.0 3.5 - 5.1 mmol/L   Chloride 110 101 - 111 mmol/L   CO2 22 22 - 32 mmol/L   Glucose, Bld 138 (H) 65 - 99 mg/dL   BUN 16 6 - 20 mg/dL   Creatinine, Ser 0.84 0.44 - 1.00 mg/dL   Calcium 9.0 8.9 - 10.3 mg/dL   GFR calc non Af Amer >60 >60 mL/min   GFR calc Af Amer >60 >60 mL/min   Anion gap 9 5 - 15  CBC     Status: Abnormal   Collection Time: 12/26/14  4:41 AM  Result Value Ref Range   WBC 9.1 4.0 - 10.5 K/uL   RBC 2.42 (L) 3.87 - 5.11 MIL/uL   Hemoglobin 7.3 (L) 12.0 - 15.0 g/dL   HCT 23.9 (L) 36.0 - 46.0 %   MCV 98.8 78.0 - 100.0 fL   MCH 30.2 26.0 -  34.0 pg   MCHC 30.5 30.0 - 36.0 g/dL   RDW 16.0 (H) 11.5 - 15.5 %   Platelets 429 (H) 150 - 400 K/uL  Glucose, capillary     Status: Abnormal   Collection Time: 12/26/14  8:08 AM  Result Value Ref Range   Glucose-Capillary 122 (H) 65 - 99 mg/dL  Glucose, capillary     Status: Abnormal   Collection Time: 12/26/14 11:35 AM  Result Value Ref Range   Glucose-Capillary 127 (H) 65 - 99 mg/dL  Glucose, capillary     Status: Abnormal   Collection Time: 12/26/14  4:42 PM  Result Value Ref Range   Glucose-Capillary 113 (H) 65 - 99 mg/dL    Signed: Maryellen Pile, MD 12/26/2014, 7:57 PM

## 2014-12-26 NOTE — Progress Notes (Addendum)
Subjective: Patient is very somnolent this morning. She will open her eyes to painful stimulation but quickly falls back asleep. Vital signs are stable.  Objective: Vital signs in last 24 hours: Filed Vitals:   12/25/14 1654 12/25/14 2006 12/26/14 0400 12/26/14 0640  BP: 151/59 143/87 157/60 153/55  Pulse: 109 70 102 104  Temp: 98.6 F (37 C) 98 F (36.7 C) 98 F (36.7 C)   TempSrc: Oral Oral Axillary   Resp: 18 17 16 20   Height:      Weight:  159 lb 6.3 oz (72.3 kg)    SpO2: 100% 99% 100% 96%   Weight change: -1 lb 8.7 oz (-0.7 kg)  Intake/Output Summary (Last 24 hours) at 12/26/14 0812 Last data filed at 12/25/14 1654  Gross per 24 hour  Intake    747 ml  Output      0 ml  Net    747 ml   GENERAL- somnolent, responds to painful stimuli, NAD CARDIAC- RRR, no murmurs, rubs or gallops. RESP- Decreased breath sounds at bases, no wheezes or crackles. ABDOMEN- Soft, nontender, no guarding or rebound, bowel sounds present. NEURO- Somnolent EXTREMITIES- pulse 2+, symmetric, trace pedal edema. SKIN- Warm, dry, No rash or lesion.  Lab Results: Basic Metabolic Panel:  Recent Labs Lab 12/20/14 0430 12/21/14 0625  12/23/14 0240  12/25/14 0533 12/26/14 0441  NA 145 149*  < > 152*  < > 142 141  K 3.5 3.6  < > 3.3*  < > 3.8 4.0  CL 106 106  < > 113*  < > 110 110  CO2 31 30  < > 31  < > 20* 22  GLUCOSE 223* 126*  < > 178*  < > 141* 138*  BUN 39* 24*  < > 13  < > 10 16  CREATININE 0.85 0.85  < > 0.80  < > 0.89 0.84  CALCIUM 9.3 8.9  < > 9.4  < > 9.0 9.0  MG 2.1  --   --  2.2  --   --   --   PHOS 2.7 3.4  --   --   --   --   --   < > = values in this interval not displayed. Liver Function Tests:  Recent Labs Lab 12/23/14 0240  AST 26  ALT 47  ALKPHOS 74  BILITOT 0.5  PROT 6.7  ALBUMIN 2.3*   CBC:  Recent Labs Lab 12/23/14 0240  12/25/14 0533 12/26/14 0441  WBC 10.8*  < > 8.7 9.1  NEUTROABS 7.4  --   --   --   HGB 8.1*  < > 7.9* 7.3*  HCT 28.0*  < >  26.6* 23.9*  MCV 102.6*  < > 99.3 98.8  PLT 536*  < > 365 429*  < > = values in this interval not displayed.  CBG:  Recent Labs Lab 12/25/14 0005 12/25/14 0409 12/25/14 0752 12/25/14 1137 12/25/14 1653 12/25/14 2209  GLUCAP 284* 116* 204* 172* 223* 166*   Hemoglobin A1C:  Recent Labs Lab 12/23/14 0240  HGBA1C 6.8*   Coagulation:  Recent Labs Lab 12/23/14 0240  LABPROT 15.8*  INR 1.24   Anemia Panel:  Recent Labs Lab 12/22/14 2042 12/23/14 0240  VITAMINB12  --  1390*  FOLATE 23.7  --   FERRITIN  --  268  TIBC  --  237*  IRON  --  32  RETICCTPCT  --  2.9   Urine Drug Screen: Drugs of Abuse  Component Value Date/Time   LABOPIA NONE DETECTED 12/30/2007 2051   COCAINSCRNUR NONE DETECTED 12/30/2007 2051   LABBENZ POSITIVE* 12/30/2007 2051   AMPHETMU NONE DETECTED 12/30/2007 2051   THCU NONE DETECTED 12/30/2007 2051   LABBARB  12/30/2007 2051    NONE DETECTED        DRUG SCREEN FOR MEDICAL PURPOSES ONLY.  IF CONFIRMATION IS NEEDED FOR ANY PURPOSE, NOTIFY LAB WITHIN 5 DAYS.    Micro Results: No results found for this or any previous visit (from the past 240 hour(s)). Studies/Results: Dg Swallowing Func-speech Pathology  12/24/2014    Objective Swallowing Evaluation:    Patient Details  Name: [name] MRN: [id number] Date of Birth: October 13, 1940  Today's Date: 12/24/2014 Time: SLP Start Time (ACUTE ONLY): 0940-SLP Stop Time (ACUTE ONLY): 1005 SLP Time Calculation (min) (ACUTE ONLY): 25 min  Past Medical History:  Past Medical History  Diagnosis Date  . Anemia, iron deficiency   . Anxiety   . CAD (coronary artery disease)   . Type II or unspecified type diabetes mellitus without mention of  complication, not stated as uncontrolled   . GERD (gastroesophageal reflux disease)   . HTN (hypertension)   . Peripheral neuropathy   . Schizophrenia   . Hyperlipidemia   . Allergic rhinitis   . 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 Dementia    . IBS (irritable bowel syndrome)   . Syncope     EP study 1996 w/atrial tachycardia  . Esophageal motility disorder   . Renal insufficiency    Past Surgical History:  Past Surgical History  Procedure Laterality Date  . Abdominal hysterectomy    . Ptca  1995  . Knee arthroplasty      Right   . Breast biopsy      Negative   HPI:  Other Pertinent Information: 74 year old female with h/o HTN, DM,  schizophrenia, admitted 8/19 w/ working dx of CAP. Developed progressive  airspace disease on CXR and worsening hypoxia on 8/20. Transferred to the  ICU for emergent intubation. Extubated 8/30 am.   No Data Recorded  Assessment / Plan / Recommendation CHL IP CLINICAL IMPRESSIONS 12/24/2014  Therapy Diagnosis Mild oral phase dysphagia;Moderate pharyngeal phase  dysphagia  Clinical Impression Pt continues to present with a mild oral, moderate  pharyngeal sensorimotor dysphagia characterized by a delay in swallow  initiation, reduced hyolaryngeal elevation, and reduced epiglottic  inversion. This resulted in reduced airway protection and moderate amount  of aspiration x1 of nectar thick liquids prior to swallow initiation which  was not immediately sensed by pt; trace penetration noted on additional  trials of nectar thick liquid when cueing pt to swallow residuals. Mild  residuals noted across consistencies at base of tongue and vallecula which  pt cleared when cued to swallow a 2nd time. Pt did not have dentures which  resulted in mildly prolonged mastication with solid trial but with  adequate pharyngeal clearance, only minimal vallecular residuals. No  penetration/ aspiration noted with trials of honey-thick liquids.  Recommend advancing diet to dysphagia 2 consistency but continue honey  thick liquids by cup, meds whole in puree, full supervision to cue pt to  take small sips and to swallow a 2nd time to clear residuals. Will  continue to follow for diet tolerance/ pt and family education/ consider  advancement or repeat MBS.        CHL IP TREATMENT RECOMMENDATION 12/24/2014  Treatment Recommendations Therapy as  outlined in treatment plan below     CHL IP DIET RECOMMENDATION 12/24/2014  SLP Diet Recommendations Dysphagia 2 (Fine chop);Honey  Liquid Administration via (None)  Medication Administration Whole meds with puree  Compensations Minimize environmental distractions;Slow rate;Small  sips/bites;Multiple dry swallows after each bite/sip  Postural Changes and/or Swallow Maneuvers (None)     CHL IP OTHER RECOMMENDATIONS 12/24/2014  Recommended Consults (None)  Oral Care Recommendations Oral care BID  Other Recommendations Order thickener from pharmacy;Clarify dietary  restrictions     No flowsheet data found.   CHL IP FREQUENCY AND DURATION 12/24/2014  Speech Therapy Frequency (ACUTE ONLY) min 3x week  Treatment Duration 2 weeks     Pertinent Vitals/Pain n/a    SLP Swallow Goals No flowsheet data found.  No flowsheet data found.    CHL IP REASON FOR REFERRAL 12/24/2014  Reason for Referral Objectively evaluate swallowing function     CHL IP ORAL PHASE 12/24/2014  Lips (None)  Tongue (None)  Mucous membranes (None)  Nutritional status (None)  Other (None)  Oxygen therapy (None)  Oral Phase Impaired  Oral - Pudding Teaspoon (None)  Oral - Pudding Cup (None)  Oral - Honey Teaspoon (None)  Oral - Honey Cup (None)  Oral - Honey Syringe (None)  Oral - Nectar Teaspoon (None)  Oral - Nectar Cup (None)  Oral - Nectar Straw (None)  Oral - Nectar Syringe (None)  Oral - Ice Chips (None)  Oral - Thin Teaspoon (None)  Oral - Thin Cup (None)  Oral - Thin Straw (None)  Oral - Thin Syringe (None)  Oral - Puree (None)  Oral - Mechanical Soft (None)  Oral - Regular (None)  Oral - Multi-consistency (None)  Oral - Pill (None)  Oral Phase - Comment (None)      CHL IP PHARYNGEAL PHASE 12/24/2014  Pharyngeal Phase Impaired  Pharyngeal - Pudding Teaspoon (None)  Penetration/Aspiration details (pudding teaspoon) (None)  Pharyngeal - Pudding Cup (None)  Penetration/Aspiration  details (pudding cup) (None)  Pharyngeal - Honey Teaspoon (None)  Penetration/Aspiration details (honey teaspoon) (None)  Pharyngeal - Honey Cup (None)  Penetration/Aspiration details (honey cup) (None)  Pharyngeal - Honey Syringe (None)  Penetration/Aspiration details (honey syringe) (None)  Pharyngeal - Nectar Teaspoon (None)  Penetration/Aspiration details (nectar teaspoon) (None)  Pharyngeal - Nectar Cup (None)  Penetration/Aspiration details (nectar cup) (None)  Pharyngeal - Nectar Straw (None)  Penetration/Aspiration details (nectar straw) (None)  Pharyngeal - Nectar Syringe (None)  Penetration/Aspiration details (nectar syringe) (None)  Pharyngeal - Ice Chips (None)  Penetration/Aspiration details (ice chips) (None)  Pharyngeal - Thin Teaspoon (None)  Penetration/Aspiration details (thin teaspoon) (None)  Pharyngeal - Thin Cup (None)  Penetration/Aspiration details (thin cup) (None)  Pharyngeal - Thin Straw (None)  Penetration/Aspiration details (thin straw) (None)  Pharyngeal - Thin Syringe (None)  Penetration/Aspiration details (thin syringe') (None)  Pharyngeal - Puree (None)  Penetration/Aspiration details (puree) (None)  Pharyngeal - Mechanical Soft (None)  Penetration/Aspiration details (mechanical soft) (None)  Pharyngeal - Regular (None)  Penetration/Aspiration details (regular) (None)  Pharyngeal - Multi-consistency (None)  Penetration/Aspiration details (multi-consistency) (None)  Pharyngeal - Pill (None)  Penetration/Aspiration details (pill) (None)  Pharyngeal Comment (None)      CHL IP CERVICAL ESOPHAGEAL PHASE 12/24/2014  Cervical Esophageal Phase WFL  Pudding Teaspoon (None)  Pudding Cup (None)  Honey Teaspoon (None)  Honey Cup (None)  Honey Straw (None)  Nectar Teaspoon (None)  Nectar Cup (None)  Nectar Straw (None)  Nectar Sippy Cup (None)  Thin Teaspoon (None)  Thin Cup (None)  Thin Straw (None)  Thin Sippy Cup (None)  Cervical Esophageal Comment (None)    No flowsheet data found.          Kern Reap, MA, CCC-SLP 12/24/2014, 10:32 AM [phone]   Medications: I have reviewed the patient's current medications. Scheduled Meds: . amitriptyline  25 mg Oral QHS  . amLODipine  10 mg Oral Daily  . antiseptic oral rinse  7 mL Mouth Rinse QID  . aspirin  325 mg Oral Daily  . benztropine  2 mg Oral Daily  . chlorhexidine gluconate  15 mL Mouth Rinse BID  . clonazePAM  0.25 mg Oral QHS  . cloZAPine  200 mg Oral QHS  . dextromethorphan-guaiFENesin  1 tablet Oral BID  . donepezil  10 mg Oral QHS  . enoxaparin (LOVENOX) injection  40 mg Subcutaneous Q24H  . feeding supplement (ENSURE ENLIVE)  237 mL Oral BID BM  . gabapentin  600 mg Oral TID  . haloperidol  10 mg Oral BID  . insulin aspart  0-15 Units Subcutaneous TID WC  . insulin glargine  15 Units Subcutaneous QHS  . loratadine  10 mg Oral Daily  . memantine  10 mg Oral BID  . multivitamin  5 mL Oral Daily  . pantoprazole  40 mg Oral QHS  . potassium chloride  8 mEq Oral Daily  . ramipril  20 mg Oral Daily  . [place]   Oral Once  . senna-docusate  1 tablet Oral QHS   Continuous Infusions:  PRN Meds:.acetaminophen, albuterol, fluticasone, RESOURCE THICKENUP CLEAR Assessment/Plan: Active Problems:   Diabetes mellitus, type 2   Dementia   Schizophrenia, unspecified type   Essential hypertension   CAP (community acquired pneumonia)   Acute respiratory failure with hypoxia   ARDS (adult respiratory distress syndrome)   Acute encephalopathy   Hypernatremia   Hypokalemia   Sepsis   Anemia  Sepsis due to acute bronchitis with mucous plugging and lobar collapse complicated by ARDS - Patient is very somnolent this morning. Will wake up to painful stimulation but quickly falls back asleep. She did have a fall yesterday without reported head trauma. Neuro exam done yesterday was normal. CT head this morning with no acute findings, no subdural hematoma. CXR clear with no signs of aspiration. She is sating well on  room air. She was restarted on gabapentin yesterday. Received amitripyline, clonazepam, clozapine, aricept, and gabapentin around the same time at about 2200 last night. Could be sedation 2/2 medications. -Will stop Gabapentin an Clonazepam.  -Off oxygen therapy, sats upper 90s on RA. O2 prn to keep SpO2>92% -Continue incentive spirometry and flutter valve -Continue albuterol nebulizer Q 4 hr PRN  -Continue mucinex 30-600 mg BID for cough -Continue PT and OT who recommend SNF placement   Hypertension - Remains hypertensive with SBP in 150s.  -Continue ramipril 20 mg daily and amlodipine 10 mg daily  -Hold home lasix 20 mg daily   Chronic Hypokalemia - K normal (4.0) today. Pt at home on lasix.  -Continuehome potassium chloride 8 mEq daily   Insulin-dependent Type 2 DM with neuropathy - Last A1c 6.8 on 12/23/14. Pt at home on Novolog 70/30 mix 25 U BID, Lantus 15 U daily, and actos 40 mg daily  -Continue Lantus 15 U daily  -Continue moderate SSI with meals  -Monitor CBG's before meals and at bedtime -Stopping home gabapentin 600 mg TID in setting of sedation and continue amitriptyline 25 mg daily for chronic  peripheral neuropathy  Schizophrenia - Currently with stable mood  -Continue benztropine 2 mg daily and clozapine 200 mg daily -Stopping clonazepam 0.25 mg daily and haldol 10 mg BID in setting of sedation  Alzheimer's Dementia  -Continue home aricept 10 mg daily and namenda 10 mg BID   Chronic Normocytic Anemia - Hg 7.9 below baseline 9-11 with no active bleeding in setting of recent severe sepsis. Anemia panel on 12/23/14 consistent with ACD of unclear etiology.  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  -Continue home aspirin 325 mg daily  Allergic Rhinitis - Currently with stable symptoms  -Continue home flonase 2  sprays in each nostril PRN -Continue home xyzal 5 mg daily   Diet: Dysphagia 2 with honey thick fluids  DVT PPx: Lovenox   Code: Full   Dispo: Disposition is deferred at this time, awaiting improvement of current medical problems.  Anticipated discharge in approximately 1-3 day(s).   The patient does not have a current PCP (Provider Not In System) and does need an [facility] hospital follow-up appointment after discharge.  The patient does not have transportation limitations that hinder transportation to clinic appointments.  .Services Needed at time of discharge: Y = Yes, Blank = No PT:   OT:   RN:   Equipment:   Other:     LOS: 17 days   Maryellen Pile, MD 12/26/2014, 8:12 AM

## 2014-12-26 NOTE — Progress Notes (Signed)
  Date: 12/26/2014  Patient name: [name]  Medical record number: [id number]  Date of birth: 08-29-1940   This patient's plan of care was discussed with the house staff. Please see Dr. Shanna Cisco note for complete details. I concur with his findings.  Attending will be Dr. Beryle Beams starting tomorrow.   Sid Falcon, MD 12/26/2014, 8:21 AM

## 2014-12-26 NOTE — Progress Notes (Signed)
Pt daughter in room inquiring about medications.  Paged internal medicine, spoke to Dr. Benjamine Mola, stated he would be up to speak with daughter.

## 2014-12-27 DIAGNOSIS — I10 Essential (primary) hypertension: Secondary | ICD-10-CM | POA: Diagnosis not present

## 2014-12-27 DIAGNOSIS — M6281 Muscle weakness (generalized): Secondary | ICD-10-CM | POA: Diagnosis not present

## 2014-12-27 DIAGNOSIS — F209 Schizophrenia, unspecified: Secondary | ICD-10-CM | POA: Diagnosis not present

## 2014-12-27 DIAGNOSIS — J9601 Acute respiratory failure with hypoxia: Secondary | ICD-10-CM | POA: Diagnosis not present

## 2014-12-27 DIAGNOSIS — G9340 Encephalopathy, unspecified: Secondary | ICD-10-CM | POA: Diagnosis not present

## 2014-12-27 DIAGNOSIS — F0390 Unspecified dementia without behavioral disturbance: Secondary | ICD-10-CM | POA: Diagnosis not present

## 2014-12-27 DIAGNOSIS — J80 Acute respiratory distress syndrome: Secondary | ICD-10-CM | POA: Diagnosis not present

## 2014-12-27 DIAGNOSIS — D649 Anemia, unspecified: Secondary | ICD-10-CM | POA: Diagnosis not present

## 2014-12-27 DIAGNOSIS — F10280 Alcohol dependence with alcohol-induced anxiety disorder: Secondary | ICD-10-CM

## 2014-12-27 DIAGNOSIS — A419 Sepsis, unspecified organism: Secondary | ICD-10-CM | POA: Diagnosis not present

## 2014-12-27 DIAGNOSIS — J189 Pneumonia, unspecified organism: Secondary | ICD-10-CM | POA: Diagnosis not present

## 2014-12-27 DIAGNOSIS — E119 Type 2 diabetes mellitus without complications: Secondary | ICD-10-CM | POA: Diagnosis not present

## 2014-12-27 DIAGNOSIS — E1165 Type 2 diabetes mellitus with hyperglycemia: Secondary | ICD-10-CM | POA: Diagnosis not present

## 2014-12-27 DIAGNOSIS — R1312 Dysphagia, oropharyngeal phase: Secondary | ICD-10-CM | POA: Diagnosis not present

## 2014-12-27 DIAGNOSIS — E785 Hyperlipidemia, unspecified: Secondary | ICD-10-CM | POA: Diagnosis not present

## 2014-12-27 LAB — GLUCOSE, CAPILLARY
Glucose-Capillary: 217 mg/dL — ABNORMAL HIGH (ref 65–99)
Glucose-Capillary: 101 mg/dL — ABNORMAL HIGH (ref 65–99)
Glucose-Capillary: 201 mg/dL — ABNORMAL HIGH (ref 65–99)
Glucose-Capillary: 104 mg/dL — ABNORMAL HIGH (ref 65–99)

## 2014-12-27 LAB — CBC
HEMATOCRIT: 24.2 % — AB (ref 36.0–46.0)
Hemoglobin: 7.2 g/dL — ABNORMAL LOW (ref 12.0–15.0)
MCH: 29.4 pg (ref 26.0–34.0)
MCHC: 29.8 g/dL — AB (ref 30.0–36.0)
MCV: 98.8 fL (ref 78.0–100.0)
PLATELETS: 415 10*3/uL — AB (ref 150–400)
RBC: 2.45 MIL/uL — ABNORMAL LOW (ref 3.87–5.11)
RDW: 16.4 % — AB (ref 11.5–15.5)
WBC: 8.3 10*3/uL (ref 4.0–10.5)

## 2014-12-27 MED ORDER — CLOZAPINE 100 MG PO TABS
100.0000 mg | ORAL_TABLET | Freq: Every day | ORAL | Status: DC
  Filled 2014-12-27: qty 1

## 2014-12-27 MED ORDER — CLOZAPINE 100 MG PO TABS: 100.0000 mg | ORAL_TABLET | Freq: Every day | ORAL | Status: DC

## 2014-12-27 MED ORDER — GI COCKTAIL ~~LOC~~
30.0000 mL | Freq: Once | ORAL | Status: AC
Start: 2014-12-27 — End: 2014-12-27
  Administered 2014-12-27: 30 mL via ORAL
  Filled 2014-12-27: qty 30

## 2014-12-27 MED ORDER — HALOPERIDOL 10 MG PO TABS: 5.0000 mg | ORAL_TABLET | ORAL | Status: DC | PRN

## 2014-12-27 MED ORDER — RAMIPRIL 10 MG PO CAPS: 20.0000 mg | ORAL_CAPSULE | Freq: Every day | ORAL | Status: DC

## 2014-12-27 NOTE — Progress Notes (Signed)
Paged Internal medicine, pt co chest pain stabbing, pt stated thought was "acid reflux", pt and family stated has bad reflux has been hospitalized for it.  VSS bp 144/55 hr 105, paged Internal Medicine will give GI cocktail. No other new orders at this time.

## 2014-12-27 NOTE — Care Management Note (Deleted)
Case Management Note  Patient Details  Name: [name] MRN: [id number] Date of Birth: 1940-11-25  Subjective/Objective:      CM following for progession and d/c planning.              Action/Plan: 12/27/2014 Plan is to d/c to SNF today after HD.   Expected Discharge Date:    12/27/2014              Expected Discharge Plan:  Skilled Nursing Facility  In-House Referral:  Clinical Social Work  Discharge planning Services  CM Consult  Post Acute Care Choice:  NA Choice offered to:     DME Arranged:    DME Agency:     HH Arranged:    [place] Agency:     Status of Service:  Completed, signed off  Medicare Important Message Given:  Yes-fourth notification given Date Medicare IM Given:    Medicare IM give by:    Date Additional Medicare IM Given:    Additional Medicare Important Message give by:     If discussed at [place] of Stay Meetings, dates discussed:    Additional Comments:  Adron Bene, RN 12/27/2014, 10:22 AM

## 2014-12-27 NOTE — Progress Notes (Signed)
Speech Language Pathology Treatment: Dysphagia  Patient Details Name: [name] MRN: [id number] DOB: 02/28/1941 Today's Date: 12/27/2014 Time: 9924-2683 SLP Time Calculation (min) (ACUTE ONLY): 28 min  Assessment / Plan / Recommendation Clinical Impression  Pt seen with am meal. SLP provided verbal cueing for second swallow with less than 50 % accuracy from pt. Instructed pt to given verbal or gestural confirmation with each swallow to increase pt awareness. Trials of nectar thick texture increased throat clearing and wet vocal quality indicating ongoing penetration/aspiration events. Led pt in laryngeal closure exercise with 5 reps and effortful swallow x5 with max visual and verbal cues needed for pt follow through. Recommend continuing pt diet. Question FEES prior to d/c; given persistent hoarse vocal quality question partial vocal fold paralysis contributing to dysphagia. Will f/u tomorrow.    HPI Other Pertinent Information: 74 year old female with h/o HTN, DM, schizophrenia, admitted 8/19 w/ working dx of CAP. Developed progressive airspace disease on CXR and worsening hypoxia on 8/20. Transferred to the ICU for emergent intubation. Extubated 8/30 am.    Pertinent Vitals Pain Assessment: No/denies pain  SLP Plan  Continue with current plan of care    Recommendations Diet recommendations: Dysphagia 2 (fine chop);Honey-thick liquid Liquids provided via: Cup;No straw Medication Administration: Whole meds with puree Supervision: Staff to assist with self feeding;Full supervision/cueing for compensatory strategies Compensations: Minimize environmental distractions;Slow rate;Small sips/bites;Multiple dry swallows after each bite/sip Postural Changes and/or Swallow Maneuvers: Seated upright 90 degrees              Oral Care Recommendations: Oral care BID Follow up Recommendations: Home health SLP Plan: Continue with current plan of care    GO    [facility], MA CCC-SLP  [phone]  Lynann Beaver 12/27/2014, 8:45 AM

## 2014-12-27 NOTE — Progress Notes (Signed)
Patient Discharge: Disposition: Patient discharged to [facility], given report to nurse Lenell Antu, RN.   IV: Discontinued IV before discharge. Telemetry: N/A Transportation: Patient transported via EMS. Belongings: Patient took all her belongings with her.

## 2014-12-27 NOTE — Care Management Note (Signed)
Case Management Note  Patient Details  Name: Marleny Faller MRN: [id number] Date of Birth: 11/19/1940  Subjective/Objective:                 CM following for progression and d/c planning.   Action/Plan: No HH needs at this time, pt will d/c to SNF for short term.12/27/2014  Expected Discharge Date:       12/27/14           Expected Discharge Plan:  Skilled Nursing Facility  In-House Referral:  Clinical Social Work  Discharge planning Services  CM Consult  Post Acute Care Choice:  NA Choice offered to:     DME Arranged:    DME Agency:     HH Arranged:    [place] Agency:     Status of Service:  Completed, signed off  Medicare Important Message Given:  Yes-fourth notification given Date Medicare IM Given:    Medicare IM give by:    Date Additional Medicare IM Given:    Additional Medicare Important Message give by:     If discussed at [place] of Stay Meetings, dates discussed:    Additional Comments:  Adron Bene, RN 12/27/2014, 10:20 AM

## 2014-12-27 NOTE — Clinical Social Work Placement (Signed)
   CLINICAL SOCIAL WORK PLACEMENT  NOTE  Date:  12/27/2014  Patient Details  Name: [name] MRN: [id number] Date of Birth: 17-Oct-1940  Clinical Social Work is seeking post-discharge placement for this patient at the [place] level of care (*CSW will initial, date and re-position this form in  chart as items are completed):  Yes   Patient/family provided with [facility] Work Department's list of facilities offering this level of care within the geographic area requested by the patient (or if unable, by the patient's family).  Yes   Patient/family informed of their freedom to choose among providers that offer the needed level of care, that participate in Medicare, Medicaid or managed care program needed by the patient, have an available bed and are willing to accept the patient.  Yes   Patient/family informed of [facility]'s ownership interest in [facility] and [facility], as well as of the fact that they are under no obligation to receive care at these facilities.  PASRR submitted to EDS on       PASRR number received on       Existing PASRR number confirmed on 12/22/14     FL2 transmitted to all facilities in geographic area requested by pt/family on 12/22/14     FL2 transmitted to all facilities within larger geographic area on       Patient informed that his/her managed care company has contracts with or will negotiate with certain facilities, including the following:         12/26/14 - Patient/family informed of bed offers received.  Patient chooses bed at  [place]     Physician recommends and patient chooses bed at      Patient to be transferred to  [facility] on  12/27/14.  Patient to be transferred to facility by  ambulance     Patient family notified on  12/27/14 of transfer.  Name of family member notified:   Daughter Theophilus Kinds at the bedside.    PHYSICIAN Please sign FL2     Additional Comment:     _______________________________________________ Sable Feil, LCSW 12/27/2014, 4:25 PM

## 2014-12-27 NOTE — Progress Notes (Signed)
Subjective: [name] is awake and alert this morning. Reports continued productive cough but improved from previous. Denies any SOB or wheezing. No other complaints. Back to her baseline this morning.   Objective: Vital signs in last 24 hours: Filed Vitals:   12/26/14 0640 12/26/14 1047 12/26/14 1644 12/26/14 2020  BP: 153/55 156/62 152/60 142/72  Pulse: 104 101 109 108  Temp:  98.2 F (36.8 C) 98.7 F (37.1 C) 98.9 F (37.2 C)  TempSrc:  Axillary Oral Oral  Resp: 20 18 18 18   Height:      Weight:    160 lb 4.4 oz (72.7 kg)  SpO2: 96% 95% 97% 96%   Weight change: 14.1 oz (0.4 kg)  Intake/Output Summary (Last 24 hours) at 12/27/14 0714 Last data filed at 12/26/14 0935  Gross per 24 hour  Intake      0 ml  Output      0 ml  Net      0 ml   Physical Exam GENERAL-  alert, co-operative, not in any distress. CARDIAC- RRR, no murmurs, rubs or gallops. RESP- Decreased breath sounds at bases, no wheezes or crackles. ABDOMEN- Soft, nontender, no guarding or rebound, bowel sounds present. NEURO- alert and awake EXTREMITIES- pulse 2+, symmetric, trace pedal edema. SKIN- Warm, dry, No rash or lesion.  Lab Results: Basic Metabolic Panel:  Recent Labs Lab 12/21/14 0625  12/23/14 0240  12/25/14 0533 12/26/14 0441  NA 149*  < > 152*  < > 142 141  K 3.6  < > 3.3*  < > 3.8 4.0  CL 106  < > 113*  < > 110 110  CO2 30  < > 31  < > 20* 22  GLUCOSE 126*  < > 178*  < > 141* 138*  BUN 24*  < > 13  < > 10 16  CREATININE 0.85  < > 0.80  < > 0.89 0.84  CALCIUM 8.9  < > 9.4  < > 9.0 9.0  MG  --   --  2.2  --   --   --   PHOS 3.4  --   --   --   --   --   < > = values in this interval not displayed. Liver Function Tests:  Recent Labs Lab 12/23/14 0240  AST 26  ALT 47  ALKPHOS 74  BILITOT 0.5  PROT 6.7  ALBUMIN 2.3*   CBC:  Recent Labs Lab 12/23/14 0240  12/26/14 0441 12/27/14 0455  WBC 10.8*  < > 9.1 8.3  NEUTROABS 7.4  --   --   --   HGB 8.1*  < > 7.3* 7.2*  HCT  28.0*  < > 23.9* 24.2*  MCV 102.6*  < > 98.8 98.8  PLT 536*  < > 429* 415*  < > = values in this interval not displayed.  CBG:  Recent Labs Lab 12/25/14 1137 12/25/14 1653 12/25/14 2209 12/26/14 0808 12/26/14 1135 12/26/14 1642  GLUCAP 172* 223* 166* 122* 127* 113*   Hemoglobin A1C:  Recent Labs Lab 12/23/14 0240  HGBA1C 6.8*   Coagulation:  Recent Labs Lab 12/23/14 0240  LABPROT 15.8*  INR 1.24   Anemia Panel:  Recent Labs Lab 12/22/14 2042 12/23/14 0240  VITAMINB12  --  1390*  FOLATE 23.7  --   FERRITIN  --  268  TIBC  --  237*  IRON  --  32  RETICCTPCT  --  2.9   Urine Drug Screen:  Drugs of Abuse     Component Value Date/Time   LABOPIA NONE DETECTED 12/30/2007 2051   COCAINSCRNUR NONE DETECTED 12/30/2007 2051   LABBENZ POSITIVE* 12/30/2007 2051   AMPHETMU NONE DETECTED 12/30/2007 2051   THCU NONE DETECTED 12/30/2007 2051   LABBARB  12/30/2007 2051    NONE DETECTED        DRUG SCREEN FOR MEDICAL PURPOSES ONLY.  IF CONFIRMATION IS NEEDED FOR ANY PURPOSE, NOTIFY LAB WITHIN 5 DAYS.    Micro Results: No results found for this or any previous visit (from the past 240 hour(s)). Studies/Results: Dg Chest 2 View  12/26/2014   CLINICAL DATA:  Altered mental status and cough.  EXAM: CHEST  2 VIEW  COMPARISON:  12/21/2014 and prior radiographs  FINDINGS: The cardiomediastinal silhouette is unchanged.  There has been removal of a right IJ central venous catheter.  Mild pulmonary vasculature congestion and possible mild interstitial edema again noted.  Bibasilar atelectasis again noted, improved on the left.  There is no evidence of pneumothorax.  IMPRESSION: Right IJ central venous catheter removal and improved left basilar aeration, otherwise unchanged exam. Pulmonary vascular congestion and possible mild interstitial edema again noted.   Electronically Signed   By: Margarette Canada M.D.   On: 12/26/2014 08:47   Ct Head Wo Contrast  12/26/2014   CLINICAL DATA:   74 year old female with altered mental status, difficult to arouse this morning. Initial encounter.  EXAM: CT HEAD WITHOUT CONTRAST  TECHNIQUE: Contiguous axial images were obtained from the base of the skull through the vertex without intravenous contrast.  COMPARISON:  12/10/2014 and earlier.  FINDINGS: Mild mastoid effusions are new. Negative nasopharynx. Paranasal sinuses remain clear. No acute osseous abnormality identified. Stable orbit and scalp soft tissues.  Calcified atherosclerosis at the skull base. Cerebral volume is stable and within normal limits for age. No ventriculomegaly. No midline shift, mass effect, or evidence of intracranial mass lesion. No acute intracranial hemorrhage identified. No suspicious intracranial vascular hyperdensity. Stable occasional white matter hypodensity (left external capsule). No cortically based acute infarct identified.  IMPRESSION: 1. Stable noncontrast CT appearance of the brain since August. 2. New mild mastoid effusions.   Electronically Signed   By: Genevie Ann M.D.   On: 12/26/2014 08:46   Medications: I have reviewed the patient's current medications. Scheduled Meds: . amitriptyline  25 mg Oral QHS  . amLODipine  10 mg Oral Daily  . antiseptic oral rinse  7 mL Mouth Rinse QID  . aspirin  325 mg Oral Daily  . benztropine  2 mg Oral Daily  . chlorhexidine gluconate  15 mL Mouth Rinse BID  . cloZAPine  200 mg Oral QHS  . dextromethorphan-guaiFENesin  1 tablet Oral BID  . donepezil  10 mg Oral QHS  . enoxaparin (LOVENOX) injection  40 mg Subcutaneous Q24H  . feeding supplement (ENSURE ENLIVE)  237 mL Oral BID BM  . haloperidol  10 mg Oral BID  . insulin aspart  0-15 Units Subcutaneous TID WC  . insulin glargine  15 Units Subcutaneous QHS  . loratadine  10 mg Oral Daily  . memantine  10 mg Oral BID  . multivitamin  5 mL Oral Daily  . pantoprazole  40 mg Oral QHS  . potassium chloride  8 mEq Oral Daily  . ramipril  20 mg Oral Daily  . [place]   Oral Once  . senna-docusate  1 tablet Oral QHS   Continuous Infusions:  PRN Meds:.acetaminophen, albuterol,  fluticasone, RESOURCE THICKENUP CLEAR Assessment/Plan: Active Problems:   Diabetes mellitus, type 2   Dementia   Schizophrenia, unspecified type   Essential hypertension   CAP (community acquired pneumonia)   Acute respiratory failure with hypoxia   ARDS (adult respiratory distress syndrome)   Acute encephalopathy   Hypernatremia   Hypokalemia   Sepsis   Anemia  Sepsis due to acute bronchitis with mucous plugging and lobar collapse complicated by ARDS -  Patient is much more alert this morning. Believe she was somnolent yesterday 2/2 medications. Stopped several of her sedating medications yesterday. D/C Gabapentin, Clonazepam and Haldol. Decreased Clozapine dose to 100 mg qhs. Vitals are stable. Likely ready for discharge today pending SNF placement.  -Off oxygen therapy, sats upper 90s on RA. O2 prn to keep SpO2>92% -Continue incentive spirometry and flutter valve -Continue albuterol nebulizer Q 4 hr PRN  -Continue mucinex 30-600 mg BID prn for cough -Continue PT and OT who recommend SNF placement  -SNF placement  Hypertension - BPs improving overnight. Lasted 131/53. Continue current medications.   -Continue ramipril 20 mg daily and amlodipine 10 mg daily  -Hold home lasix 20 mg daily, no LE edema. Will not continue on discharge.  Chronic Hypokalemia - K normal (4.0) today. Pt at home on lasix.  -Continue home potassium chloride 8 mEq daily   Insulin-dependent Type 2 DM with neuropathy - Last A1c 6.8 on 12/23/14. Pt at home on Novolog 70/30 mix 25 U BID, Lantus 15 U daily, and actos 40 mg daily  -Continue Lantus 15 U daily  -Continue moderate SSI with meals  -Monitor CBG's before meals and at bedtime -Stopped home gabapentin 600 mg TID 2/2 sedation -Continue amitriptyline 25 mg daily for chronic peripheral neuropathy  Schizophrenia -  Currently with stable mood  -Continue benztropine 2 mg daily -Decrease clozapine 200 mg daily to 100 mg daily -Stopping clonazepam 0.25 mg daily and haldol 10 mg BID 2/2 sedation  Alzheimer's Dementia  -Continue home aricept 10 mg daily and namenda 10 mg BID   Chronic Normocytic Anemia - Hg 7.9 below baseline 9-11 with no active bleeding in setting of recent severe sepsis. Anemia panel on 12/23/14 consistent with ACD of unclear etiology.  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  -Continue home aspirin 325 mg daily  Allergic Rhinitis - Currently with stable symptoms  -Continue home flonase 2 sprays in each nostril PRN -Continue home xyzal 5 mg daily   Diet: Dysphagia 2 with honey thick fluids  DVT PPx: Lovenox   Code: Full   Dispo: Anticipate discharge later today, awaiting SNF placement  The patient does not have a current PCP (Provider Not In System) and does need an [facility] hospital follow-up appointment after discharge.  The patient does not have transportation limitations that hinder transportation to clinic appointments.  .Services Needed at time of discharge: Y = Yes, Blank = No PT:   OT:   RN:   Equipment:   Other:     LOS: 18 days   Jane Pile, MD 12/27/2014, 7:14 AM  [name] PGY1 IM Resident [phone]

## 2014-12-27 NOTE — Care Management Important Message (Signed)
Important Message  Patient Details  Name: [name] MRN: [id number] Date of Birth: 04/05/1941   Medicare Important Message Given:  Yes-fourth notification given    Delorse Lek 12/27/2014, 12:08 PM

## 2014-12-28 DIAGNOSIS — J189 Pneumonia, unspecified organism: Secondary | ICD-10-CM | POA: Diagnosis not present

## 2014-12-28 DIAGNOSIS — E785 Hyperlipidemia, unspecified: Secondary | ICD-10-CM | POA: Diagnosis not present

## 2014-12-28 DIAGNOSIS — E119 Type 2 diabetes mellitus without complications: Secondary | ICD-10-CM | POA: Diagnosis not present

## 2014-12-28 DIAGNOSIS — I10 Essential (primary) hypertension: Secondary | ICD-10-CM | POA: Diagnosis not present

## 2014-12-29 DIAGNOSIS — D649 Anemia, unspecified: Secondary | ICD-10-CM | POA: Diagnosis not present

## 2014-12-29 DIAGNOSIS — I10 Essential (primary) hypertension: Secondary | ICD-10-CM | POA: Diagnosis not present

## 2014-12-29 DIAGNOSIS — J189 Pneumonia, unspecified organism: Secondary | ICD-10-CM | POA: Diagnosis not present

## 2014-12-29 DIAGNOSIS — F0390 Unspecified dementia without behavioral disturbance: Secondary | ICD-10-CM | POA: Diagnosis not present

## 2014-12-29 DIAGNOSIS — E1165 Type 2 diabetes mellitus with hyperglycemia: Secondary | ICD-10-CM | POA: Diagnosis not present

## 2014-12-29 DIAGNOSIS — A419 Sepsis, unspecified organism: Secondary | ICD-10-CM | POA: Diagnosis not present

## 2014-12-30 DIAGNOSIS — F0390 Unspecified dementia without behavioral disturbance: Secondary | ICD-10-CM | POA: Diagnosis not present

## 2014-12-30 DIAGNOSIS — I10 Essential (primary) hypertension: Secondary | ICD-10-CM | POA: Diagnosis not present

## 2014-12-30 DIAGNOSIS — F209 Schizophrenia, unspecified: Secondary | ICD-10-CM | POA: Diagnosis not present

## 2014-12-30 DIAGNOSIS — E119 Type 2 diabetes mellitus without complications: Secondary | ICD-10-CM | POA: Diagnosis not present

## 2015-01-02 DIAGNOSIS — J189 Pneumonia, unspecified organism: Secondary | ICD-10-CM | POA: Diagnosis not present

## 2015-01-02 DIAGNOSIS — I10 Essential (primary) hypertension: Secondary | ICD-10-CM | POA: Diagnosis not present

## 2015-01-02 DIAGNOSIS — E119 Type 2 diabetes mellitus without complications: Secondary | ICD-10-CM | POA: Diagnosis not present

## 2015-01-02 DIAGNOSIS — F209 Schizophrenia, unspecified: Secondary | ICD-10-CM | POA: Diagnosis not present

## 2015-01-04 DIAGNOSIS — F209 Schizophrenia, unspecified: Secondary | ICD-10-CM | POA: Diagnosis not present

## 2015-01-04 DIAGNOSIS — I10 Essential (primary) hypertension: Secondary | ICD-10-CM | POA: Diagnosis not present

## 2015-01-04 DIAGNOSIS — E119 Type 2 diabetes mellitus without complications: Secondary | ICD-10-CM | POA: Diagnosis not present

## 2015-01-04 DIAGNOSIS — D649 Anemia, unspecified: Secondary | ICD-10-CM | POA: Diagnosis not present

## 2015-01-17 DIAGNOSIS — I10 Essential (primary) hypertension: Secondary | ICD-10-CM | POA: Diagnosis not present

## 2015-01-17 DIAGNOSIS — E785 Hyperlipidemia, unspecified: Secondary | ICD-10-CM | POA: Diagnosis not present

## 2015-01-17 DIAGNOSIS — F0390 Unspecified dementia without behavioral disturbance: Secondary | ICD-10-CM | POA: Diagnosis not present

## 2015-01-17 DIAGNOSIS — E119 Type 2 diabetes mellitus without complications: Secondary | ICD-10-CM | POA: Diagnosis not present

## 2015-01-27 DIAGNOSIS — F200 Paranoid schizophrenia: Secondary | ICD-10-CM | POA: Diagnosis not present

## 2015-01-27 DIAGNOSIS — Z79899 Other long term (current) drug therapy: Secondary | ICD-10-CM | POA: Diagnosis not present

## 2015-01-30 DIAGNOSIS — M6281 Muscle weakness (generalized): Secondary | ICD-10-CM | POA: Diagnosis not present

## 2015-01-30 DIAGNOSIS — E119 Type 2 diabetes mellitus without complications: Secondary | ICD-10-CM | POA: Diagnosis not present

## 2015-01-30 DIAGNOSIS — F0390 Unspecified dementia without behavioral disturbance: Secondary | ICD-10-CM | POA: Diagnosis not present

## 2015-01-30 DIAGNOSIS — I10 Essential (primary) hypertension: Secondary | ICD-10-CM | POA: Diagnosis not present

## 2015-02-03 DIAGNOSIS — E119 Type 2 diabetes mellitus without complications: Secondary | ICD-10-CM | POA: Diagnosis not present

## 2015-02-03 DIAGNOSIS — M6281 Muscle weakness (generalized): Secondary | ICD-10-CM | POA: Diagnosis not present

## 2015-02-07 DIAGNOSIS — E119 Type 2 diabetes mellitus without complications: Secondary | ICD-10-CM | POA: Diagnosis not present

## 2015-02-07 DIAGNOSIS — M6281 Muscle weakness (generalized): Secondary | ICD-10-CM | POA: Diagnosis not present

## 2015-02-08 DIAGNOSIS — E784 Other hyperlipidemia: Secondary | ICD-10-CM | POA: Diagnosis not present

## 2015-02-08 DIAGNOSIS — D649 Anemia, unspecified: Secondary | ICD-10-CM | POA: Diagnosis not present

## 2015-02-08 DIAGNOSIS — I10 Essential (primary) hypertension: Secondary | ICD-10-CM | POA: Diagnosis not present

## 2015-02-08 DIAGNOSIS — E1140 Type 2 diabetes mellitus with diabetic neuropathy, unspecified: Secondary | ICD-10-CM | POA: Diagnosis not present

## 2015-02-08 DIAGNOSIS — Z794 Long term (current) use of insulin: Secondary | ICD-10-CM | POA: Diagnosis not present

## 2015-02-08 DIAGNOSIS — Z79899 Other long term (current) drug therapy: Secondary | ICD-10-CM | POA: Diagnosis not present

## 2015-02-08 DIAGNOSIS — E1165 Type 2 diabetes mellitus with hyperglycemia: Secondary | ICD-10-CM | POA: Diagnosis not present

## 2015-02-08 DIAGNOSIS — F0390 Unspecified dementia without behavioral disturbance: Secondary | ICD-10-CM | POA: Diagnosis not present

## 2015-02-09 DIAGNOSIS — E119 Type 2 diabetes mellitus without complications: Secondary | ICD-10-CM | POA: Diagnosis not present

## 2015-02-09 DIAGNOSIS — M6281 Muscle weakness (generalized): Secondary | ICD-10-CM | POA: Diagnosis not present

## 2015-02-13 DIAGNOSIS — M6281 Muscle weakness (generalized): Secondary | ICD-10-CM | POA: Diagnosis not present

## 2015-02-13 DIAGNOSIS — E119 Type 2 diabetes mellitus without complications: Secondary | ICD-10-CM | POA: Diagnosis not present

## 2015-02-21 DIAGNOSIS — M6281 Muscle weakness (generalized): Secondary | ICD-10-CM | POA: Diagnosis not present

## 2015-02-21 DIAGNOSIS — E119 Type 2 diabetes mellitus without complications: Secondary | ICD-10-CM | POA: Diagnosis not present

## 2015-02-24 DIAGNOSIS — R05 Cough: Secondary | ICD-10-CM | POA: Diagnosis not present

## 2015-02-24 DIAGNOSIS — D649 Anemia, unspecified: Secondary | ICD-10-CM | POA: Diagnosis not present

## 2015-02-24 DIAGNOSIS — J329 Chronic sinusitis, unspecified: Secondary | ICD-10-CM | POA: Diagnosis not present

## 2015-02-27 DIAGNOSIS — M6281 Muscle weakness (generalized): Secondary | ICD-10-CM | POA: Diagnosis not present

## 2015-02-27 DIAGNOSIS — E119 Type 2 diabetes mellitus without complications: Secondary | ICD-10-CM | POA: Diagnosis not present

## 2015-03-02 DIAGNOSIS — E119 Type 2 diabetes mellitus without complications: Secondary | ICD-10-CM | POA: Diagnosis not present

## 2015-03-02 DIAGNOSIS — M6281 Muscle weakness (generalized): Secondary | ICD-10-CM | POA: Diagnosis not present

## 2015-03-27 DIAGNOSIS — Z79899 Other long term (current) drug therapy: Secondary | ICD-10-CM | POA: Diagnosis not present

## 2015-03-27 DIAGNOSIS — F200 Paranoid schizophrenia: Secondary | ICD-10-CM | POA: Diagnosis not present

## 2015-03-28 ENCOUNTER — Other Ambulatory Visit: Payer: Self-pay | Admitting: Internal Medicine

## 2015-04-24 ENCOUNTER — Other Ambulatory Visit: Payer: Self-pay | Admitting: Internal Medicine

## 2015-04-27 DIAGNOSIS — Z79899 Other long term (current) drug therapy: Secondary | ICD-10-CM | POA: Diagnosis not present

## 2015-04-27 DIAGNOSIS — F200 Paranoid schizophrenia: Secondary | ICD-10-CM | POA: Diagnosis not present

## 2015-05-15 DIAGNOSIS — J301 Allergic rhinitis due to pollen: Secondary | ICD-10-CM | POA: Diagnosis not present

## 2015-05-15 DIAGNOSIS — Z8659 Personal history of other mental and behavioral disorders: Secondary | ICD-10-CM | POA: Diagnosis not present

## 2015-05-15 DIAGNOSIS — H9313 Tinnitus, bilateral: Secondary | ICD-10-CM | POA: Diagnosis not present

## 2015-05-15 DIAGNOSIS — H903 Sensorineural hearing loss, bilateral: Secondary | ICD-10-CM | POA: Diagnosis not present

## 2015-05-15 DIAGNOSIS — H9113 Presbycusis, bilateral: Secondary | ICD-10-CM | POA: Diagnosis not present

## 2015-05-22 DIAGNOSIS — E119 Type 2 diabetes mellitus without complications: Secondary | ICD-10-CM | POA: Diagnosis not present

## 2015-06-01 DIAGNOSIS — F200 Paranoid schizophrenia: Secondary | ICD-10-CM | POA: Diagnosis not present

## 2015-06-01 DIAGNOSIS — Z79899 Other long term (current) drug therapy: Secondary | ICD-10-CM | POA: Diagnosis not present

## 2015-06-29 DIAGNOSIS — F200 Paranoid schizophrenia: Secondary | ICD-10-CM | POA: Diagnosis not present

## 2015-06-29 DIAGNOSIS — Z79899 Other long term (current) drug therapy: Secondary | ICD-10-CM | POA: Diagnosis not present

## 2015-07-28 DIAGNOSIS — J309 Allergic rhinitis, unspecified: Secondary | ICD-10-CM | POA: Diagnosis not present

## 2015-08-03 ENCOUNTER — Other Ambulatory Visit: Payer: Self-pay | Admitting: Obstetrics and Gynecology

## 2015-08-03 DIAGNOSIS — N63 Unspecified lump in breast: Secondary | ICD-10-CM | POA: Diagnosis not present

## 2015-08-03 DIAGNOSIS — R928 Other abnormal and inconclusive findings on diagnostic imaging of breast: Secondary | ICD-10-CM

## 2015-08-03 DIAGNOSIS — Z6828 Body mass index (BMI) 28.0-28.9, adult: Secondary | ICD-10-CM | POA: Diagnosis not present

## 2015-08-03 DIAGNOSIS — Z1231 Encounter for screening mammogram for malignant neoplasm of breast: Secondary | ICD-10-CM | POA: Diagnosis not present

## 2015-08-03 DIAGNOSIS — Z01411 Encounter for gynecological examination (general) (routine) with abnormal findings: Secondary | ICD-10-CM | POA: Diagnosis not present

## 2015-08-08 ENCOUNTER — Ambulatory Visit
Admission: RE | Admit: 2015-08-08 | Discharge: 2015-08-08 | Disposition: A | Discharge status: Home / Self Care | Payer: Medicare Other | Source: Ambulatory Visit | Attending: Obstetrics and Gynecology | Admitting: Obstetrics and Gynecology

## 2015-08-08 ENCOUNTER — Other Ambulatory Visit: Payer: Self-pay | Admitting: Obstetrics and Gynecology

## 2015-08-08 DIAGNOSIS — R928 Other abnormal and inconclusive findings on diagnostic imaging of breast: Secondary | ICD-10-CM

## 2015-08-08 DIAGNOSIS — N6320 Unspecified lump in the left breast, unspecified quadrant: Secondary | ICD-10-CM

## 2015-08-08 DIAGNOSIS — N63 Unspecified lump in breast: Secondary | ICD-10-CM | POA: Diagnosis not present

## 2015-08-09 DIAGNOSIS — C50912 Malignant neoplasm of unspecified site of left female breast: Secondary | ICD-10-CM | POA: Diagnosis not present

## 2015-08-09 DIAGNOSIS — N63 Unspecified lump in breast: Secondary | ICD-10-CM | POA: Diagnosis not present

## 2015-08-10 DIAGNOSIS — Z79899 Other long term (current) drug therapy: Secondary | ICD-10-CM | POA: Diagnosis not present

## 2015-08-10 DIAGNOSIS — F200 Paranoid schizophrenia: Secondary | ICD-10-CM | POA: Diagnosis not present

## 2015-08-11 ENCOUNTER — Other Ambulatory Visit: Payer: Medicare Other

## 2015-08-11 DIAGNOSIS — E7800 Pure hypercholesterolemia, unspecified: Secondary | ICD-10-CM | POA: Diagnosis not present

## 2015-08-11 DIAGNOSIS — Z794 Long term (current) use of insulin: Secondary | ICD-10-CM | POA: Diagnosis not present

## 2015-08-11 DIAGNOSIS — I10 Essential (primary) hypertension: Secondary | ICD-10-CM | POA: Diagnosis not present

## 2015-08-11 DIAGNOSIS — D649 Anemia, unspecified: Secondary | ICD-10-CM | POA: Diagnosis not present

## 2015-08-11 DIAGNOSIS — J069 Acute upper respiratory infection, unspecified: Secondary | ICD-10-CM | POA: Diagnosis not present

## 2015-08-11 DIAGNOSIS — E1165 Type 2 diabetes mellitus with hyperglycemia: Secondary | ICD-10-CM | POA: Diagnosis not present

## 2015-08-11 DIAGNOSIS — B9789 Other viral agents as the cause of diseases classified elsewhere: Secondary | ICD-10-CM | POA: Diagnosis not present

## 2015-08-11 DIAGNOSIS — Z79899 Other long term (current) drug therapy: Secondary | ICD-10-CM | POA: Diagnosis not present

## 2015-08-14 ENCOUNTER — Ambulatory Visit: Payer: Self-pay | Admitting: Surgery

## 2015-08-14 DIAGNOSIS — C50912 Malignant neoplasm of unspecified site of left female breast: Secondary | ICD-10-CM

## 2015-08-14 NOTE — H&P (Signed)
History of Present Illness [name] Sylvia Helms MD; 08/14/2015 12:59 PM) The patient is a 75 year old female who presents with breast cancer. Referring - Cornella PCP - [facility]/ Brigitte Pulse  Reason - new left breast cancer  This is a 75 year old female who was recently undergoing a routine physical examination by Dr. Raphael Gibney. He noticed a left breast mass. She was referred to the Breast [facility] where she underwent a diagnostic mammogram and left breast ultrasound on 08/08/15. The left breast shows an irregular spiculated mass in the upper outer quadrant measuring about 2 cm. The axilla was negative. Biopsy was recommended. For various reasons the patient's daughter wanted a second opinion and took her to [facility] on the same day. 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 Prognostic panel is pending. She presents now for surgical evaluation. She is accompanied by HER-2 daughters. The patient lives independently but does have an Environmental consultant to come in every day.  Menarche - 52 First pregnancy - 17 Breastfeed - no Hormones - no Family history - no   CLINICAL DATA: Screening recall for bilateral breast masses.  EXAM: 2D DIGITAL DIAGNOSTIC BILATERAL MAMMOGRAM WITH CAD AND ADJUNCT TOMO  LEFT BREAST ULTRASOUND  COMPARISON: Previous exam(s).  ACR Breast Density Category c: The breast tissue is heterogeneously dense, which may obscure small masses.  FINDINGS: Spot compression CC and MLO tomograms were performed of the bilateral breasts. The initially questioned possible right breast masses resolves on the additional imaging with findings compatible with superimposed fibroglandular tissue. An oval mass containing coarse calcifications present in the far upper outer posterior right breast appears unchanged dating back to prior mammograms from 03/24/2009.  There is an irregular spiculated mass in the upper-outer left breast measuring approximately 2  cm. Additional questioned possible left breast masses do not definitely persist on the additional imaging.  Mammographic images were processed with CAD.  Physical examination of the outer left breast reveals a firm mass at the approximate 3 o'clock position.  Targeted ultrasound of the left breast was performed demonstrating an irregular hypoechoic mass at 3 o'clock 4 cm from nipple measuring 1.4 x 1.3 x 2.2 cm. This corresponds with the mass seen at mammography. No definite lymphadenopathy seen in the left axilla. The outer and central left breast was scanned with no suspicious abnormality seen.  IMPRESSION: Highly suspicious left breast mass.  RECOMMENDATION: Ultrasound-guided biopsy of the mass in the outer left breast is recommended. This is scheduled for Friday April 21st at 9 a.m. The patient will be accompanied by her daughter. She will stop her aspirin a few days prior to the procedure.  I have discussed the findings and recommendations with the patient. Results were also provided in writing at the conclusion of the visit. If applicable, a reminder letter will be sent to the patient regarding the next appointment.  BI-RADS CATEGORY 5: Highly suggestive of malignancy.   Electronically Signed By: Everlean Alstrom M.D. On: 08/08/2015 08:31   Other Problems Elbert Ewings, CMA; 08/14/2015 11:28 AM) Arthritis Back Pain Congestive Heart Failure Depression Diabetes Mellitus Gastroesophageal Reflux Disease High blood pressure Hypercholesterolemia  Past Surgical History Elbert Ewings, CMA; 08/14/2015 11:28 AM) Breast Biopsy Left. Hysterectomy (not due to cancer) - Partial Knee Surgery Bilateral.  Diagnostic Studies History Elbert Ewings, CMA; 08/14/2015 11:28 AM) Colonoscopy never Mammogram within last year Pap Smear 1-5 years ago  Allergies Elbert Ewings, CMA; 08/14/2015 11:30 AM) Codeine Sulfate *ANALGESICS - OPIOID* Glimepiride  *ANTIDIABETICS* GlyBURIDE *ANTIDIABETICS* TraMADol HCl *CHEMICALS* SHELLFISH  Medication History Elbert Ewings, CMA; 08/14/2015 11:33 AM) Elavil (25MG Tablet, Oral) Active. Norvasc (10MG Tablet, Oral) Active. Aspirin (81MG Tablet, Oral) Active. Cogentin (2MG Tablet, Oral) Active. Clozaril (100MG Tablet, Oral) Active. Aricept (10MG Tablet, Oral) Active. Flonase (50MCG/ACT Suspension, Nasal) Active. Haldol (10MG Tablet, Oral) Active. NovoLOG Mix 70/30 FlexPen ((70-30) 100UNIT/ML Susp Pen-inj, Subcutaneous) Active. Lantus SoloStar (100UNIT/ML Soln Pen-inj, Subcutaneous) Active. Xyzal (5MG Tablet, Oral) Active. Namenda (10MG Tablet, Oral) Active. Multiple Vitamin (Oral) Active. NexIUM (40MG Capsule DR, Oral) Active. Actos (30MG Tablet, Oral) Active. Klor-Con (8MEQ Tablet ER, Oral) Active. Altace (10MG Capsule, Oral) Active. Crestor (20MG Tablet, Oral) Active. Medications Reconciled  Social History Elbert Ewings, [place]; 08/14/2015 11:28 AM) Caffeine use Carbonated beverages. No drug use  Family History Elbert Ewings, [place]; 08/14/2015 11:28 AM) Arthritis Daughter, Mother. Depression Daughter. Diabetes Mellitus Daughter. Heart Disease Daughter.  Pregnancy / Birth History Elbert Ewings, CMA; 08/14/2015 11:28 AM) Age at menarche 89 years. Age of menopause 85-55 Gravida 5 Irregular periods Maternal age 12-20 Para 5     Review of Systems Elbert Ewings CMA; 08/14/2015 11:28 AM) General Present- Night Sweats. Not Present- Appetite Loss, Chills, Fatigue, Fever, Weight Gain and Weight Loss. Skin Present- Dryness. Not Present- Change in Wart/Mole, Hives, Jaundice, New Lesions, Non-Healing Wounds, Rash and Ulcer. HEENT Present- Hearing Loss, Ringing in the Ears, Seasonal Allergies, Sinus Pain, Visual Disturbances, Wears glasses/contact lenses and Yellow Eyes. Not Present- Earache, Hoarseness, Nose Bleed, Oral Ulcers and Sore Throat. Breast Present- Breast Mass.  Not Present- Breast Pain, Nipple Discharge and Skin Changes. Cardiovascular Present- Difficulty Breathing Lying Down, Shortness of Breath and Swelling of Extremities. Not Present- Chest Pain, Leg Cramps, Palpitations and Rapid Heart Rate. Gastrointestinal Present- Abdominal Pain and Constipation. Not Present- Bloating, Bloody Stool, Change in Bowel Habits, Chronic diarrhea, Difficulty Swallowing, Excessive gas, Gets full quickly at meals, Hemorrhoids, Indigestion, Nausea, Rectal Pain and Vomiting. Female Genitourinary Present- Nocturia. Not Present- Frequency, Painful Urination, Pelvic Pain and Urgency. Neurological Present- Decreased Memory, Trouble walking and Weakness. Not Present- Fainting, Headaches, Numbness, Seizures, Tingling and Tremor. Psychiatric Present- Anxiety, Change in Sleep Pattern and Depression. Not Present- Bipolar, Fearful and Frequent crying. Endocrine Present- Hot flashes. Not Present- Cold Intolerance, Excessive Hunger, Hair Changes, Heat Intolerance and New Diabetes.  Vitals Elbert Ewings CMA; 08/14/2015 11:33 AM) 08/14/2015 11:33 AM Weight: 158.6 lb Height: 63in Body Surface Area: 1.75 m Body Mass Index: 28.09 kg/m  Temp.: 97.1F  Pulse: 107 (Regular)  BP: 150/82 (Sitting, Left Arm, Standard)      Physical Exam Rodman Key K. Samora Jernberg MD; 08/14/2015 12:58 PM)  The physical exam findings are as follows: Note:WDWN in NAD HEENT: EOMI, sclera anicteric Neck: No masses, no thyromegaly Lungs: CTA bilaterally; normal respiratory effort Breasts: Symmetric with no nipple retraction or drainage bilaterally The right breast shows no palpable masses or axillary lymphadenopathy The left breast shows a firm palpable mass in the upper outer quadrant between 2:00 and 3:00. Axillary lymphadenopathy is absent. CV: Regular rate and rhythm; no murmurs Abd: +bowel sounds, soft, non-tender, no masses Ext: Well-perfused; no edema Skin: Warm, dry; no sign of  jaundice    Assessment & Plan Rodman Key K. Avrie Kedzierski MD; 08/14/2015 1:00 PM)  BREAST CANCER, LEFT (C50.912)  Current Plans Pt Education - [facility] Breast Cancer Information Given - [facility] "Breast Journey" Package Schedule for Surgery - left radioactive seed localized lumpectomy/ left axillary sentinel lymph node biopsy. The surgical procedure has been discussed with the patient. Potential risks, benefits, alternative treatments, and expected outcomes have been explained. All  of the patient's questions at this time have been answered. The likelihood of reaching the patient's treatment goal is good. The patient understand the proposed surgical procedure and wishes to proceed. Note:I spent about 45 minutes with the patient and her daughters discussing the options for treatment. Based on our current information, I would recommend breast conserving therapy with lumpectomy and sentinel lymph node biopsy versus a mastectomy. We discussed the need for further information before determine the need for adjuvant therapy. At this point we will recommend a left radioactive seed localized lumpectomy and a left axillary sentinel lymph node biopsy.   [name] Georgette Dover, MD, [facility] Surgery  General/ Trauma Surgery  08/14/2015 1:00 PM

## 2015-08-16 ENCOUNTER — Encounter (HOSPITAL_BASED_OUTPATIENT_CLINIC_OR_DEPARTMENT_OTHER)
Admission: RE | Admit: 2015-08-16 | Discharge: 2015-08-16 | Disposition: A | Discharge status: Home / Self Care | Payer: Medicare Other | Source: Ambulatory Visit | Attending: Surgery | Admitting: Surgery

## 2015-08-16 ENCOUNTER — Encounter (HOSPITAL_BASED_OUTPATIENT_CLINIC_OR_DEPARTMENT_OTHER): Payer: Self-pay | Admitting: *Deleted

## 2015-08-16 DIAGNOSIS — Z955 Presence of coronary angioplasty implant and graft: Secondary | ICD-10-CM | POA: Diagnosis not present

## 2015-08-16 DIAGNOSIS — Z79899 Other long term (current) drug therapy: Secondary | ICD-10-CM | POA: Diagnosis not present

## 2015-08-16 DIAGNOSIS — I110 Hypertensive heart disease with heart failure: Secondary | ICD-10-CM | POA: Diagnosis not present

## 2015-08-16 DIAGNOSIS — I509 Heart failure, unspecified: Secondary | ICD-10-CM | POA: Diagnosis not present

## 2015-08-16 DIAGNOSIS — E119 Type 2 diabetes mellitus without complications: Secondary | ICD-10-CM | POA: Diagnosis not present

## 2015-08-16 DIAGNOSIS — Z794 Long term (current) use of insulin: Secondary | ICD-10-CM | POA: Diagnosis not present

## 2015-08-16 DIAGNOSIS — E7800 Pure hypercholesterolemia, unspecified: Secondary | ICD-10-CM | POA: Diagnosis not present

## 2015-08-16 DIAGNOSIS — F0390 Unspecified dementia without behavioral disturbance: Secondary | ICD-10-CM | POA: Diagnosis not present

## 2015-08-16 DIAGNOSIS — K219 Gastro-esophageal reflux disease without esophagitis: Secondary | ICD-10-CM | POA: Diagnosis not present

## 2015-08-16 DIAGNOSIS — Z87891 Personal history of nicotine dependence: Secondary | ICD-10-CM | POA: Diagnosis not present

## 2015-08-16 DIAGNOSIS — Z171 Estrogen receptor negative status [ER-]: Secondary | ICD-10-CM | POA: Diagnosis not present

## 2015-08-16 DIAGNOSIS — I2510 Atherosclerotic heart disease of native coronary artery without angina pectoris: Secondary | ICD-10-CM | POA: Diagnosis not present

## 2015-08-16 DIAGNOSIS — C50912 Malignant neoplasm of unspecified site of left female breast: Secondary | ICD-10-CM | POA: Diagnosis present

## 2015-08-16 DIAGNOSIS — Z7982 Long term (current) use of aspirin: Secondary | ICD-10-CM | POA: Diagnosis not present

## 2015-08-16 DIAGNOSIS — C50412 Malignant neoplasm of upper-outer quadrant of left female breast: Secondary | ICD-10-CM | POA: Diagnosis not present

## 2015-08-16 LAB — BASIC METABOLIC PANEL
ANION GAP: 14 (ref 5–15)
BUN: 14 mg/dL (ref 6–20)
CHLORIDE: 103 mmol/L (ref 101–111)
CO2: 24 mmol/L (ref 22–32)
Calcium: 10.3 mg/dL (ref 8.9–10.3)
Creatinine, Ser: 1.1 mg/dL — ABNORMAL HIGH (ref 0.44–1.00)
GFR calc Af Amer: 56 mL/min — ABNORMAL LOW (ref >60)
GFR, EST NON AFRICAN AMERICAN: 48 mL/min — AB (ref >60)
Glucose, Bld: 202 mg/dL — ABNORMAL HIGH (ref 65–99)
POTASSIUM: 3.3 mmol/L — AB (ref 3.5–5.1)
SODIUM: 141 mmol/L (ref 135–145)

## 2015-08-17 ENCOUNTER — Ambulatory Visit (HOSPITAL_COMMUNITY)
Admission: RE | Admit: 2015-08-17 | Discharge: 2015-08-17 | Disposition: A | Discharge status: Home / Self Care | Payer: Medicare Other | Source: Ambulatory Visit | Attending: Surgery | Admitting: Surgery

## 2015-08-17 ENCOUNTER — Encounter (HOSPITAL_COMMUNITY): Payer: Self-pay

## 2015-08-17 ENCOUNTER — Ambulatory Visit (HOSPITAL_BASED_OUTPATIENT_CLINIC_OR_DEPARTMENT_OTHER)
Admission: RE | Admit: 2015-08-17 | Discharge: 2015-08-17 | Disposition: A | Discharge status: Home / Self Care | Payer: Medicare Other | Source: Ambulatory Visit | Attending: Surgery | Admitting: Surgery

## 2015-08-17 ENCOUNTER — Ambulatory Visit (HOSPITAL_BASED_OUTPATIENT_CLINIC_OR_DEPARTMENT_OTHER): Payer: Medicare Other | Admitting: Anesthesiology

## 2015-08-17 ENCOUNTER — Encounter (HOSPITAL_BASED_OUTPATIENT_CLINIC_OR_DEPARTMENT_OTHER): Payer: Self-pay | Admitting: *Deleted

## 2015-08-17 ENCOUNTER — Encounter (HOSPITAL_BASED_OUTPATIENT_CLINIC_OR_DEPARTMENT_OTHER)
Admission: RE | Disposition: A | Discharge status: Home / Self Care | Payer: Self-pay | Source: Ambulatory Visit | Attending: Surgery

## 2015-08-17 DIAGNOSIS — K219 Gastro-esophageal reflux disease without esophagitis: Secondary | ICD-10-CM | POA: Insufficient documentation

## 2015-08-17 DIAGNOSIS — I509 Heart failure, unspecified: Secondary | ICD-10-CM | POA: Diagnosis not present

## 2015-08-17 DIAGNOSIS — C50412 Malignant neoplasm of upper-outer quadrant of left female breast: Secondary | ICD-10-CM | POA: Diagnosis not present

## 2015-08-17 DIAGNOSIS — E119 Type 2 diabetes mellitus without complications: Secondary | ICD-10-CM | POA: Insufficient documentation

## 2015-08-17 DIAGNOSIS — Z171 Estrogen receptor negative status [ER-]: Secondary | ICD-10-CM | POA: Diagnosis not present

## 2015-08-17 DIAGNOSIS — N63 Unspecified lump in breast: Secondary | ICD-10-CM | POA: Diagnosis not present

## 2015-08-17 DIAGNOSIS — N6012 Diffuse cystic mastopathy of left breast: Secondary | ICD-10-CM | POA: Diagnosis not present

## 2015-08-17 DIAGNOSIS — G8918 Other acute postprocedural pain: Secondary | ICD-10-CM | POA: Diagnosis not present

## 2015-08-17 DIAGNOSIS — R079 Chest pain, unspecified: Secondary | ICD-10-CM | POA: Diagnosis not present

## 2015-08-17 DIAGNOSIS — Z955 Presence of coronary angioplasty implant and graft: Secondary | ICD-10-CM | POA: Insufficient documentation

## 2015-08-17 DIAGNOSIS — C50912 Malignant neoplasm of unspecified site of left female breast: Secondary | ICD-10-CM

## 2015-08-17 DIAGNOSIS — I2510 Atherosclerotic heart disease of native coronary artery without angina pectoris: Secondary | ICD-10-CM | POA: Insufficient documentation

## 2015-08-17 DIAGNOSIS — F0390 Unspecified dementia without behavioral disturbance: Secondary | ICD-10-CM | POA: Insufficient documentation

## 2015-08-17 DIAGNOSIS — Z7982 Long term (current) use of aspirin: Secondary | ICD-10-CM | POA: Insufficient documentation

## 2015-08-17 DIAGNOSIS — I110 Hypertensive heart disease with heart failure: Secondary | ICD-10-CM | POA: Insufficient documentation

## 2015-08-17 DIAGNOSIS — E7800 Pure hypercholesterolemia, unspecified: Secondary | ICD-10-CM | POA: Insufficient documentation

## 2015-08-17 DIAGNOSIS — Z79899 Other long term (current) drug therapy: Secondary | ICD-10-CM | POA: Insufficient documentation

## 2015-08-17 DIAGNOSIS — Z87891 Personal history of nicotine dependence: Secondary | ICD-10-CM | POA: Insufficient documentation

## 2015-08-17 DIAGNOSIS — I10 Essential (primary) hypertension: Secondary | ICD-10-CM | POA: Diagnosis not present

## 2015-08-17 DIAGNOSIS — Z794 Long term (current) use of insulin: Secondary | ICD-10-CM | POA: Insufficient documentation

## 2015-08-17 HISTORY — DX: Unspecified osteoarthritis, unspecified site: M19.90

## 2015-08-17 HISTORY — DX: Pneumonia, unspecified organism: J18.9

## 2015-08-17 HISTORY — PX: RADIOACTIVE SEED GUIDED PARTIAL MASTECTOMY WITH AXILLARY SENTINEL LYMPH NODE BIOPSY: SHX6520

## 2015-08-17 LAB — POCT I-STAT, CHEM 8
BUN: 20 mg/dL (ref 6–20)
CALCIUM ION: 1.28 mmol/L (ref 1.13–1.30)
CREATININE: 1.1 mg/dL — AB (ref 0.44–1.00)
Chloride: 105 mmol/L (ref 101–111)
GLUCOSE: 91 mg/dL (ref 65–99)
HCT: 42 % (ref 36.0–46.0)
HEMOGLOBIN: 14.3 g/dL (ref 12.0–15.0)
POTASSIUM: 2.9 mmol/L — AB (ref 3.5–5.1)
Sodium: 145 mmol/L (ref 135–145)
TCO2: 26 mmol/L (ref 0–100)

## 2015-08-17 LAB — GLUCOSE, CAPILLARY: Glucose-Capillary: 77 mg/dL (ref 65–99)

## 2015-08-17 SURGERY — RADIOACTIVE SEED GUIDED PARTIAL MASTECTOMY WITH AXILLARY SENTINEL LYMPH NODE BIOPSY
Anesthesia: General | Site: Breast | Laterality: Left

## 2015-08-17 MED ORDER — LACTATED RINGERS IV SOLN
INTRAVENOUS | Status: DC
  Administered 2015-08-17: 11:00:00 via INTRAVENOUS

## 2015-08-17 MED ORDER — GLYCOPYRROLATE 0.2 MG/ML IJ SOLN
0.2000 mg | Freq: Once | INTRAMUSCULAR | Status: DC | PRN
Start: 2015-08-17 — End: 2015-08-17

## 2015-08-17 MED ORDER — ONDANSETRON HCL 4 MG/2ML IJ SOLN
INTRAMUSCULAR | Status: AC
  Filled 2015-08-17: qty 2

## 2015-08-17 MED ORDER — FENTANYL CITRATE (PF) 100 MCG/2ML IJ SOLN
INTRAMUSCULAR | Status: AC
  Filled 2015-08-17: qty 2

## 2015-08-17 MED ORDER — HYDROCODONE-ACETAMINOPHEN 5-325 MG PO TABS: 1.0000 | ORAL_TABLET | ORAL | Status: DC | PRN

## 2015-08-17 MED ORDER — MIDAZOLAM HCL 2 MG/2ML IJ SOLN: 1.0000 mg | INTRAMUSCULAR | Status: DC | PRN

## 2015-08-17 MED ORDER — ONDANSETRON HCL 4 MG/2ML IJ SOLN
INTRAMUSCULAR | Status: DC | PRN
  Administered 2015-08-17: 4 mg via INTRAVENOUS

## 2015-08-17 MED ORDER — CEFAZOLIN SODIUM-DEXTROSE 2-4 GM/100ML-% IV SOLN
INTRAVENOUS | Status: AC
  Filled 2015-08-17: qty 100

## 2015-08-17 MED ORDER — METHYLENE BLUE 0.5 % INJ SOLN
INTRAVENOUS | Status: AC
  Filled 2015-08-17: qty 10

## 2015-08-17 MED ORDER — CEFAZOLIN SODIUM-DEXTROSE 2-4 GM/100ML-% IV SOLN
2.0000 g | INTRAVENOUS | Status: AC
  Administered 2015-08-17 (×2): 2 g via INTRAVENOUS

## 2015-08-17 MED ORDER — ONDANSETRON HCL 4 MG/2ML IJ SOLN: 4.0000 mg | INTRAMUSCULAR | Status: DC | PRN

## 2015-08-17 MED ORDER — BUPIVACAINE-EPINEPHRINE (PF) 0.5% -1:200000 IJ SOLN
INTRAMUSCULAR | Status: DC | PRN
  Administered 2015-08-17: 30 mL via PERINEURAL

## 2015-08-17 MED ORDER — BUPIVACAINE-EPINEPHRINE 0.25% -1:200000 IJ SOLN
INTRAMUSCULAR | Status: DC | PRN
  Administered 2015-08-17: 17 mL

## 2015-08-17 MED ORDER — SCOPOLAMINE 1 MG/3DAYS TD PT72: 1.0000 | MEDICATED_PATCH | Freq: Once | TRANSDERMAL | Status: DC | PRN

## 2015-08-17 MED ORDER — METOCLOPRAMIDE HCL 5 MG/ML IJ SOLN: 10.0000 mg | Freq: Once | INTRAMUSCULAR | Status: DC | PRN

## 2015-08-17 MED ORDER — MORPHINE SULFATE (PF) 2 MG/ML IV SOLN: 2.0000 mg | INTRAVENOUS | Status: DC | PRN

## 2015-08-17 MED ORDER — PROPOFOL 10 MG/ML IV BOLUS
INTRAVENOUS | Status: DC | PRN
Start: 2015-08-17 — End: 2015-08-17
  Administered 2015-08-17: 150 mg via INTRAVENOUS

## 2015-08-17 MED ORDER — FENTANYL CITRATE (PF) 100 MCG/2ML IJ SOLN
50.0000 ug | INTRAMUSCULAR | Status: DC | PRN
  Administered 2015-08-17: 100 ug via INTRAVENOUS
  Administered 2015-08-17: 25 ug via INTRAVENOUS

## 2015-08-17 MED ORDER — PHENYLEPHRINE 40 MCG/ML (10ML) SYRINGE FOR IV PUSH (FOR BLOOD PRESSURE SUPPORT)
PREFILLED_SYRINGE | INTRAVENOUS | Status: AC
  Filled 2015-08-17: qty 10

## 2015-08-17 MED ORDER — SODIUM CHLORIDE 0.9 % IJ SOLN
INTRAMUSCULAR | Status: AC
  Filled 2015-08-17: qty 10

## 2015-08-17 MED ORDER — CHLORHEXIDINE GLUCONATE 4 % EX LIQD: 1.0000 "application " | Freq: Once | CUTANEOUS | Status: DC

## 2015-08-17 MED ORDER — BUPIVACAINE-EPINEPHRINE (PF) 0.25% -1:200000 IJ SOLN
INTRAMUSCULAR | Status: AC
  Filled 2015-08-17: qty 30

## 2015-08-17 MED ORDER — DEXAMETHASONE SODIUM PHOSPHATE 4 MG/ML IJ SOLN
INTRAMUSCULAR | Status: DC | PRN
  Administered 2015-08-17: 4 mg via INTRAVENOUS

## 2015-08-17 MED ORDER — MEPERIDINE HCL 25 MG/ML IJ SOLN: 6.2500 mg | INTRAMUSCULAR | Status: DC | PRN

## 2015-08-17 MED ORDER — LIDOCAINE HCL (CARDIAC) 20 MG/ML IV SOLN
INTRAVENOUS | Status: DC | PRN
  Administered 2015-08-17: 40 mg via INTRAVENOUS

## 2015-08-17 MED ORDER — TECHNETIUM TC 99M SULFUR COLLOID FILTERED
1.0000 | Freq: Once | INTRAVENOUS | Status: AC | PRN
  Administered 2015-08-17: 1 via INTRADERMAL

## 2015-08-17 MED ORDER — PHENYLEPHRINE HCL 10 MG/ML IJ SOLN
INTRAMUSCULAR | Status: DC | PRN
  Administered 2015-08-17: 20 ug via INTRAVENOUS
  Administered 2015-08-17: 40 ug via INTRAVENOUS

## 2015-08-17 MED ORDER — DEXAMETHASONE SODIUM PHOSPHATE 10 MG/ML IJ SOLN
INTRAMUSCULAR | Status: AC
  Filled 2015-08-17: qty 1

## 2015-08-17 MED ORDER — LIDOCAINE HCL (CARDIAC) 20 MG/ML IV SOLN
INTRAVENOUS | Status: AC
  Filled 2015-08-17: qty 5

## 2015-08-17 MED ORDER — FENTANYL CITRATE (PF) 100 MCG/2ML IJ SOLN: 25.0000 ug | INTRAMUSCULAR | Status: DC | PRN

## 2015-08-17 MED ORDER — SODIUM CHLORIDE 0.9 % IJ SOLN
INTRAMUSCULAR | Status: DC | PRN
  Administered 2015-08-17: 5 mL via SUBCUTANEOUS

## 2015-08-17 SURGICAL SUPPLY — 56 items
APL SKNCLS STERI-STRIP NONHPOA (GAUZE/BANDAGES/DRESSINGS) ×1
APPLIER CLIP 9.375 MED OPEN (MISCELLANEOUS) ×3
APR CLP MED 9.3 20 MLT OPN (MISCELLANEOUS) ×1
BENZOIN TINCTURE PRP APPL 2/3 (GAUZE/BANDAGES/DRESSINGS) ×3 IMPLANT
BLADE HEX COATED 2.75 (ELECTRODE) ×3 IMPLANT
BLADE SURG 15 STRL LF DISP TIS (BLADE) ×1 IMPLANT
BLADE SURG 15 STRL SS (BLADE) ×3
CHLORAPREP W/TINT 26ML (MISCELLANEOUS) ×3 IMPLANT
CLIP APPLIE 9.375 MED OPEN (MISCELLANEOUS) IMPLANT
CLOSURE WOUND 1/2 X4 (GAUZE/BANDAGES/DRESSINGS) ×1
COVER BACK TABLE 60X90IN (DRAPES) ×3 IMPLANT
COVER MAYO STAND STRL (DRAPES) ×3 IMPLANT
COVER PROBE W GEL 5X96 (DRAPES) ×3 IMPLANT
DEVICE DUBIN W/COMP PLATE 8390 (MISCELLANEOUS) ×3 IMPLANT
DRAPE LAPAROSCOPIC ABDOMINAL (DRAPES) ×3 IMPLANT
DRAPE UTILITY XL STRL (DRAPES) ×3 IMPLANT
DRSG TEGADERM 4X4.75 (GAUZE/BANDAGES/DRESSINGS) ×4 IMPLANT
ELECT REM PT RETURN 9FT ADLT (ELECTROSURGICAL) ×3
ELECTRODE REM PT RTRN 9FT ADLT (ELECTROSURGICAL) ×1 IMPLANT
GLOVE BIO SURGEON STRL SZ7 (GLOVE) ×3 IMPLANT
GLOVE BIOGEL M STRL SZ7.5 (GLOVE) ×2 IMPLANT
GLOVE BIOGEL PI IND STRL 7.0 (GLOVE) IMPLANT
GLOVE BIOGEL PI IND STRL 7.5 (GLOVE) ×1 IMPLANT
GLOVE BIOGEL PI IND STRL 8 (GLOVE) IMPLANT
GLOVE BIOGEL PI INDICATOR 7.0 (GLOVE) ×4
GLOVE BIOGEL PI INDICATOR 7.5 (GLOVE) ×2
GLOVE BIOGEL PI INDICATOR 8 (GLOVE) ×2
GLOVE ECLIPSE 6.5 STRL STRAW (GLOVE) ×3 IMPLANT
GOWN STRL REUS W/ TWL LRG LVL3 (GOWN DISPOSABLE) ×2 IMPLANT
GOWN STRL REUS W/ TWL XL LVL3 (GOWN DISPOSABLE) IMPLANT
GOWN STRL REUS W/TWL LRG LVL3 (GOWN DISPOSABLE) ×6
GOWN STRL REUS W/TWL XL LVL3 (GOWN DISPOSABLE) ×3
ILLUMINATOR WAVEGUIDE N/F (MISCELLANEOUS) IMPLANT
KIT MARKER MARGIN INK (KITS) ×3 IMPLANT
LIGHT WAVEGUIDE WIDE FLAT (MISCELLANEOUS) IMPLANT
NDL SAFETY ECLIPSE 18X1.5 (NEEDLE) ×1 IMPLANT
NEEDLE HYPO 18GX1.5 SHARP (NEEDLE)
NEEDLE HYPO 25X1 1.5 SAFETY (NEEDLE) ×3 IMPLANT
NS IRRIG 1000ML POUR BTL (IV SOLUTION) IMPLANT
PACK BASIN DAY SURGERY FS (CUSTOM PROCEDURE TRAY) ×3 IMPLANT
PENCIL BUTTON HOLSTER BLD 10FT (ELECTRODE) ×3 IMPLANT
SLEEVE SCD COMPRESS KNEE MED (MISCELLANEOUS) ×3 IMPLANT
SPONGE GAUZE 4X4 12PLY STER LF (GAUZE/BANDAGES/DRESSINGS) ×2 IMPLANT
SPONGE LAP 18X18 X RAY DECT (DISPOSABLE) IMPLANT
SPONGE LAP 4X18 X RAY DECT (DISPOSABLE) ×3 IMPLANT
STRIP CLOSURE SKIN 1/2X4 (GAUZE/BANDAGES/DRESSINGS) ×2 IMPLANT
SUT MON AB 4-0 PC3 18 (SUTURE) ×5 IMPLANT
SUT SILK 2 0 SH (SUTURE) IMPLANT
SUT VIC AB 3-0 SH 27 (SUTURE) ×6
SUT VIC AB 3-0 SH 27X BRD (SUTURE) ×1 IMPLANT
SYR CONTROL 10ML LL (SYRINGE) ×3 IMPLANT
TOWEL OR 17X24 6PK STRL BLUE (TOWEL DISPOSABLE) ×3 IMPLANT
TOWEL OR NON WOVEN STRL DISP B (DISPOSABLE) ×2 IMPLANT
TUBE CONNECTING 20'X1/4 (TUBING)
TUBE CONNECTING 20X1/4 (TUBING) IMPLANT
YANKAUER SUCT BULB TIP NO VENT (SUCTIONS) IMPLANT

## 2015-08-17 NOTE — Op Note (Signed)
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 Anesthesia: Gen. via LMA Indications: This is a 75 year old female who recently underwent a routine physical examination by Dr. Kennis Carina. He palpated a left breast mass. She underwent diagnostic mammogram and ultrasound which showed a 2 cm spiculated mass. The axilla was negative. Biopsy was performed on 08/09/15 revealing invasive ductal carcinoma. According to the preliminary report, the tumor is triple negative. She presents now for left lumpectomy and sentinel lymph node biopsy. A seed was placed earlier this morning.  Description of procedure: The patient is brought to the operating room and placed in a supine position on the operating room table. The presence of the seed had been confirmed in the preop holding area. She had also been injected with technetium sulfur colloid. She is placed in the supine position on the operating room table. After an adequate level of general anesthesia was obtained her left breast was prepped with ChloraPrep and draped sterile fashion. A timeout was taken to ensure the proper patient proper procedure. We infiltrated the area around the nipple with methylene blue dye solution. The neoprobe was used to identify the area of greatest activity in the lateral left breast. We made a transverse incision over this area. It was too far away from the nipple to try to hide the incision. We dissected down into the breast tissue and then raised 4 margins around the area of greatest activity. The tumor is fairly posterior so we took our lumpectomy down to the chest wall. We excised the entire mass and oriented with a paint kit. Specimen mammogram revealed the biopsy clip as well as the radioactive seed. Seem to be a bit close to the inferior  margin. We excised an additional inferior margin of about 2 cm thickness. This also was oriented with a paint kit. We inspected the wound for hemostasis. Clips were used to mark the edges of the biopsy cavity. The wound was closed with 3-0 Vicryl and 4-0 Monocryl.  We changed the settings on the neoprobe and interrogated the axilla. We  identified an area of activity in the lower axilla. We made a transverse incision over this area after infiltrated with .25 percent Marcaine with epinephrine. we dissect down the axilla tissue. We quickly identified a hot lymph node using the neoprobe. No blue dye was noted. We excised lymph node entirely and it had an ex vivo activity of 620. This was sent for pathologic examination. There is 0 background activity in the axilla. We inspected for hemostasis and closed with 3-0 Vicryl 4-0 Monocryl. Steri-Strips and clean dressings were applied to both incisions. Patient was then next available to recovery stable condition. All sponge, initially, and needle counts are correct.   Imogene Burn. Georgette Dover, MD, [facility] Surgery  General/ Trauma Surgery  08/17/2015 3:17 PM

## 2015-08-17 NOTE — Anesthesia Procedure Notes (Addendum)
Anesthesia Regional Block:  Pectoralis block  Pre-Anesthetic Checklist: ,, timeout performed, Correct Patient, Correct Site, Correct Laterality, Correct Procedure, Correct Position, site marked, Risks and benefits discussed,  Surgical consent,  Pre-op evaluation,  At surgeon's request and post-op pain management  Laterality: Left  Prep: Maximum Sterile Barrier Precautions used and chloraprep       Needles:  Injection technique: Single-shot  Needle Type: Echogenic Stimulator Needle     Needle Length: 10cm 10 cm Needle Gauge: 21 and 21 G    Additional Needles:  Procedures: ultrasound guided (picture in chart) Pectoralis block Narrative:  Injection made incrementally with aspirations every 5 mL.  Performed by: Personally   Additional Notes: Risks, benefits and alternative to block explained extensively.  Patient tolerated procedure well, without complications.   Procedure Name: LMA Insertion Date/Time: 08/17/2015 1:59 PM Performed by: Terrance Mass Pre-anesthesia Checklist: Patient identified, Emergency Drugs available, Suction available and Patient being monitored Patient Re-evaluated:Patient Re-evaluated prior to inductionOxygen Delivery Method: Circle System Utilized Preoxygenation: Pre-oxygenation with 100% oxygen Intubation Type: IV induction Ventilation: Mask ventilation without difficulty LMA: LMA inserted LMA Size: 4.0 Number of attempts: 1 Airway Equipment and Method: Bite block Placement Confirmation: positive ETCO2 Tube secured with: Tape Dental Injury: Teeth and Oropharynx as per pre-operative assessment

## 2015-08-17 NOTE — Interval H&P Note (Signed)
History and Physical Interval Note:  08/17/2015 1:36 PM  [name]  has presented today for surgery, with the diagnosis of LEFT INVASIVE DUCTAL CARCINOMA  The various methods of treatment have been discussed with the patient and family. After consideration of risks, benefits and other options for treatment, the patient has consented to  Procedure(s): RADIOACTIVE SEED GUIDED LUMPECTOMY WITH AXILLARY SENTINEL LYMPH NODE BIOPSY (Left) as a surgical intervention .  The patient's history has been reviewed, patient examined, no change in status, stable for surgery.  I have reviewed the patient's chart and labs.  Questions were answered to the patient's satisfaction.     Tashai Catino K.

## 2015-08-17 NOTE — H&P (View-Only) (Signed)
History of Present Illness [name] Laryssa Hassing MD; 08/14/2015 12:59 PM) The patient is a 75 year old female who presents with breast cancer. Referring - Cornella PCP - Stringer/ Brigitte Pulse  Reason - new left breast cancer  This is a 75 year old female who was recently undergoing a routine physical examination by Dr. Raphael Gibney. He noticed a left breast mass. She was referred to the Breast Ctr[facility] where she underwent a diagnostic mammogram and left breast ultrasound on 08/08/15. The left breast shows an irregular spiculated mass in the upper outer quadrant measuring about 2 cm. The axilla was negative. Biopsy was recommended. For various reasons the patient's daughter wanted a second opinion and took her to [facility] on the same day. 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 Prognostic panel is pending. She presents now for surgical evaluation. She is accompanied by HER-2 daughters. The patient lives independently but does have an Environmental consultant to come in every day.  Menarche - 31 First pregnancy - 17 Breastfeed - no Hormones - no Family history - no   CLINICAL DATA: Screening recall for bilateral breast masses.  EXAM: 2D DIGITAL DIAGNOSTIC BILATERAL MAMMOGRAM WITH CAD AND ADJUNCT TOMO  LEFT BREAST ULTRASOUND  COMPARISON: Previous exam(s).  ACR Breast Density Category c: The breast tissue is heterogeneously dense, which may obscure small masses.  FINDINGS: Spot compression CC and MLO tomograms were performed of the bilateral breasts. The initially questioned possible right breast masses resolves on the additional imaging with findings compatible with superimposed fibroglandular tissue. An oval mass containing coarse calcifications present in the far upper outer posterior right breast appears unchanged dating back to prior mammograms from 03/24/2009.  There is an irregular spiculated mass in the upper-outer left breast measuring approximately 2  cm. Additional questioned possible left breast masses do not definitely persist on the additional imaging.  Mammographic images were processed with CAD.  Physical examination of the outer left breast reveals a firm mass at the approximate 3 o'clock position.  Targeted ultrasound of the left breast was performed demonstrating an irregular hypoechoic mass at 3 o'clock 4 cm from nipple measuring 1.4 x 1.3 x 2.2 cm. This corresponds with the mass seen at mammography. No definite lymphadenopathy seen in the left axilla. The outer and central left breast was scanned with no suspicious abnormality seen.  IMPRESSION: Highly suspicious left breast mass.  RECOMMENDATION: Ultrasound-guided biopsy of the mass in the outer left breast is recommended. This is scheduled for Friday April 21st at 9 a.m. The patient will be accompanied by her daughter. She will stop her aspirin a few days prior to the procedure.  I have discussed the findings and recommendations with the patient. Results were also provided in writing at the conclusion of the visit. If applicable, a reminder letter will be sent to the patient regarding the next appointment.  BI-RADS CATEGORY 5: Highly suggestive of malignancy.   Electronically Signed By: Everlean Alstrom M.D. On: 08/08/2015 08:31   Other Problems Elbert Ewings, CMA; 08/14/2015 11:28 AM) Arthritis Back Pain Congestive Heart Failure Depression Diabetes Mellitus Gastroesophageal Reflux Disease High blood pressure Hypercholesterolemia  Past Surgical History Elbert Ewings, CMA; 08/14/2015 11:28 AM) Breast Biopsy Left. Hysterectomy (not due to cancer) - Partial Knee Surgery Bilateral.  Diagnostic Studies History Elbert Ewings, CMA; 08/14/2015 11:28 AM) Colonoscopy never Mammogram within last year Pap Smear 1-5 years ago  Allergies Elbert Ewings, CMA; 08/14/2015 11:30 AM) Codeine Sulfate *ANALGESICS - OPIOID* Glimepiride  *ANTIDIABETICS* GlyBURIDE *ANTIDIABETICS* TraMADol HCl *CHEMICALS* SHELLFISH  Medication History Elbert Ewings, CMA; 08/14/2015 11:33 AM) Elavil (25MG Tablet, Oral) Active. Norvasc (10MG Tablet, Oral) Active. Aspirin (81MG Tablet, Oral) Active. Cogentin (2MG Tablet, Oral) Active. Clozaril (100MG Tablet, Oral) Active. Aricept (10MG Tablet, Oral) Active. Flonase (50MCG/ACT Suspension, Nasal) Active. Haldol (10MG Tablet, Oral) Active. NovoLOG Mix 70/30 FlexPen ((70-30) 100UNIT/ML Susp Pen-inj, Subcutaneous) Active. Lantus SoloStar (100UNIT/ML Soln Pen-inj, Subcutaneous) Active. Xyzal (5MG Tablet, Oral) Active. Namenda (10MG Tablet, Oral) Active. Multiple Vitamin (Oral) Active. NexIUM (40MG Capsule DR, Oral) Active. Actos (30MG Tablet, Oral) Active. Klor-Con (8MEQ Tablet ER, Oral) Active. Altace (10MG Capsule, Oral) Active. Crestor (20MG Tablet, Oral) Active. Medications Reconciled  Social History Elbert Ewings, [place]; 08/14/2015 11:28 AM) Caffeine use Carbonated beverages. No drug use  Family History Elbert Ewings, [place]; 08/14/2015 11:28 AM) Arthritis Daughter, Mother. Depression Daughter. Diabetes Mellitus Daughter. Heart Disease Daughter.  Pregnancy / Birth History Elbert Ewings, CMA; 08/14/2015 11:28 AM) Age at menarche 31 years. Age of menopause 64-55 Gravida 5 Irregular periods Maternal age 22-20 Para 5     Review of Systems Elbert Ewings CMA; 08/14/2015 11:28 AM) General Present- Night Sweats. Not Present- Appetite Loss, Chills, Fatigue, Fever, Weight Gain and Weight Loss. Skin Present- Dryness. Not Present- Change in Wart/Mole, Hives, Jaundice, New Lesions, Non-Healing Wounds, Rash and Ulcer. HEENT Present- Hearing Loss, Ringing in the Ears, Seasonal Allergies, Sinus Pain, Visual Disturbances, Wears glasses/contact lenses and Yellow Eyes. Not Present- Earache, Hoarseness, Nose Bleed, Oral Ulcers and Sore Throat. Breast Present- Breast Mass.  Not Present- Breast Pain, Nipple Discharge and Skin Changes. Cardiovascular Present- Difficulty Breathing Lying Down, Shortness of Breath and Swelling of Extremities. Not Present- Chest Pain, Leg Cramps, Palpitations and Rapid Heart Rate. Gastrointestinal Present- Abdominal Pain and Constipation. Not Present- Bloating, Bloody Stool, Change in Bowel Habits, Chronic diarrhea, Difficulty Swallowing, Excessive gas, Gets full quickly at meals, Hemorrhoids, Indigestion, Nausea, Rectal Pain and Vomiting. Female Genitourinary Present- Nocturia. Not Present- Frequency, Painful Urination, Pelvic Pain and Urgency. Neurological Present- Decreased Memory, Trouble walking and Weakness. Not Present- Fainting, Headaches, Numbness, Seizures, Tingling and Tremor. Psychiatric Present- Anxiety, Change in Sleep Pattern and Depression. Not Present- Bipolar, Fearful and Frequent crying. Endocrine Present- Hot flashes. Not Present- Cold Intolerance, Excessive Hunger, Hair Changes, Heat Intolerance and New Diabetes.  Vitals Elbert Ewings CMA; 08/14/2015 11:33 AM) 08/14/2015 11:33 AM Weight: 158.6 lb Height: 63in Body Surface Area: 1.75 m Body Mass Index: 28.09 kg/m  Temp.: 97.26F  Pulse: 107 (Regular)  BP: 150/82 (Sitting, Left Arm, Standard)      Physical Exam Rodman Key K. Matthieu Loftus MD; 08/14/2015 12:58 PM)  The physical exam findings are as follows: Note:WDWN in NAD HEENT: EOMI, sclera anicteric Neck: No masses, no thyromegaly Lungs: CTA bilaterally; normal respiratory effort Breasts: Symmetric with no nipple retraction or drainage bilaterally The right breast shows no palpable masses or axillary lymphadenopathy The left breast shows a firm palpable mass in the upper outer quadrant between 2:00 and 3:00. Axillary lymphadenopathy is absent. CV: Regular rate and rhythm; no murmurs Abd: +bowel sounds, soft, non-tender, no masses Ext: Well-perfused; no edema Skin: Warm, dry; no sign of  jaundice    Assessment & Plan Rodman Key K. Danyal Adorno MD; 08/14/2015 1:00 PM)  BREAST CANCER, LEFT (C50.912)  Current Plans Pt Education - [facility] Breast Cancer Information Given - [facility] "Breast Journey" Package Schedule for Surgery - left radioactive seed localized lumpectomy/ left axillary sentinel lymph node biopsy. The surgical procedure has been discussed with the patient. Potential risks, benefits, alternative treatments, and expected outcomes have been explained. All  of the patient's questions at this time have been answered. The likelihood of reaching the patient's treatment goal is good. The patient understand the proposed surgical procedure and wishes to proceed. Note:I spent about 45 minutes with the patient and her daughters discussing the options for treatment. Based on our current information, I would recommend breast conserving therapy with lumpectomy and sentinel lymph node biopsy versus a mastectomy. We discussed the need for further information before determine the need for adjuvant therapy. At this point we will recommend a left radioactive seed localized lumpectomy and a left axillary sentinel lymph node biopsy.   [name] Georgette Dover, MD, [facility]  General/ Trauma Surgery  08/14/2015 1:00 PM

## 2015-08-17 NOTE — Progress Notes (Signed)
Assisted Dr. Carignan with left, ultrasound guided, pectoralis block. Side rails up, monitors on throughout procedure. See vital signs in flow sheet. Tolerated Procedure well. 

## 2015-08-17 NOTE — Transfer of Care (Signed)
Immediate Anesthesia Transfer of Care Note  Patient: [name]  Procedure(s) Performed: Procedure(s): RADIOACTIVE SEED GUIDED LUMPECTOMY WITH AXILLARY SENTINEL LYMPH NODE BIOPSY (Left)  Patient Location: PACU  Anesthesia Type:General  Level of Consciousness: awake and sedated  Airway & Oxygen Therapy: Patient Spontanous Breathing and Patient connected to face mask oxygen  Post-op Assessment: Report given to RN and Post -op Vital signs reviewed and stable  Post vital signs: Reviewed and stable  Last Vitals:  Filed Vitals:   08/17/15 1512 08/17/15 1515  BP: 173/73   Pulse: 87 88  Temp:    Resp:  24    Last Pain: There were no vitals filed for this visit.       Complications: No apparent anesthesia complications

## 2015-08-17 NOTE — Progress Notes (Signed)
Nuc med staff performed inj. No additional sedation required. Pt tol well. Will bring daughters back to bedside and update family.

## 2015-08-17 NOTE — Anesthesia Preprocedure Evaluation (Signed)
Anesthesia Evaluation  Patient identified by MRN, date of birth, ID band Patient awake    Reviewed: Allergy & Precautions, NPO status , Patient's Chart, lab work & pertinent test results  Airway Mallampati: II  TM Distance: >3 FB Neck ROM: Full    Dental no notable dental hx.    Pulmonary former smoker,    Pulmonary exam normal breath sounds clear to auscultation       Cardiovascular hypertension, Pt. on medications + CAD and + Cardiac Stents  Normal cardiovascular exam+ Valvular Problems/Murmurs MR  Rhythm:Regular Rate:Normal     Neuro/Psych Schizophrenia dementia negative psych ROS   GI/Hepatic negative GI ROS, Neg liver ROS,   Endo/Other  diabetes, Type 2  Renal/GU negative Renal ROS  negative genitourinary   Musculoskeletal negative musculoskeletal ROS (+)   Abdominal   Peds negative pediatric ROS (+)  Hematology negative hematology ROS (+)   Anesthesia Other Findings   Reproductive/Obstetrics negative OB ROS                             Anesthesia Physical Anesthesia Plan  ASA: III  Anesthesia Plan: General   Post-op Pain Management: GA combined w/ Regional for post-op pain   Induction: Intravenous  Airway Management Planned: LMA  Additional Equipment:   Intra-op Plan:   Post-operative Plan: Extubation in OR  Informed Consent: I have reviewed the patients History and Physical, chart, labs and discussed the procedure including the risks, benefits and alternatives for the proposed anesthesia with the patient or authorized representative who has indicated his/her understanding and acceptance.   Dental advisory given  Plan Discussed with: CRNA  Anesthesia Plan Comments: (L Pec block)        Anesthesia Quick Evaluation

## 2015-08-17 NOTE — Discharge Instructions (Signed)
[facility] °Office Phone Number [phone] ° °BREAST BIOPSY/ PARTIAL MASTECTOMY: POST OP INSTRUCTIONS ° °Always review your discharge instruction sheet given to you by the facility where your surgery was performed. ° °IF YOU HAVE DISABILITY OR FAMILY LEAVE FORMS, YOU MUST BRING THEM TO THE OFFICE FOR PROCESSING.  DO NOT GIVE THEM TO YOUR DOCTOR. ° °1. A prescription for pain medication may be given to you upon discharge.  Take your pain medication as prescribed, if needed.  If narcotic pain medicine is not needed, then you may take acetaminophen (Tylenol) or ibuprofen (Advil) as needed. °2. Take your usually prescribed medications unless otherwise directed °3. If you need a refill on your pain medication, please contact your pharmacy.  They will contact our office to request authorization.  Prescriptions will not be filled after 5pm or on week-ends. °4. You should eat very light the first 24 hours after surgery, such as soup, crackers, pudding, etc.  Resume your normal diet the day after surgery. °5. Most patients will experience some swelling and bruising in the breast.  Ice packs and a good support bra will help.  Swelling and bruising can take several days to resolve.  °6. It is common to experience some constipation if taking pain medication after surgery.  Increasing fluid intake and taking a stool softener will usually help or prevent this problem from occurring.  A mild laxative (Milk of Magnesia or Miralax) should be taken according to package directions if there are no bowel movements after 48 hours. °7. Unless discharge instructions indicate otherwise, you may remove your bandages 48 hours after surgery, and you may shower at that time.  You will have steri-strips (small skin tapes) in place directly over the incision.  These strips should be left on the skin for 7-10 days.   Any sutures or staples will be removed at the office during your follow-up visit. °8. ACTIVITIES:  You may resume  regular daily activities (gradually increasing) beginning the next day.  Wearing a good support bra or sports bra minimizes pain and swelling.  You may have sexual intercourse when it is comfortable. °a. You may drive when you no longer are taking prescription pain medication, you can comfortably wear a seatbelt, and you can safely maneuver your car and apply brakes. °b. RETURN TO WORK:  1-2 weeks °9. You should see your doctor in the office for a follow-up appointment approximately two weeks after your surgery.  Your doctor’s nurse will typically make your follow-up appointment when she calls you with your pathology report.  Expect your pathology report 2-3 business days after your surgery.  You may call to check if you do not hear from us after three days. °10. OTHER INSTRUCTIONS: _______________________________________________________________________________________________ _____________________________________________________________________________________________________________________________________ °_____________________________________________________________________________________________________________________________________ °_____________________________________________________________________________________________________________________________________ ° °WHEN TO CALL YOUR DOCTOR: °1. Fever over 101.0 °2. Nausea and/or vomiting. °3. Extreme swelling or bruising. °4. Continued bleeding from incision. °5. Increased pain, redness, or drainage from the incision. ° °The clinic staff is available to answer your questions during regular business hours.  Please don’t hesitate to call and ask to speak to one of the nurses for clinical concerns.  If you have a medical emergency, go to the nearest emergency room or call 911.  A surgeon from [facility] is always on call at the hospital. ° °For further questions, please visit [email or web address]  ° ° °Post Anesthesia Home Care  Instructions ° °Activity: °Get plenty of rest for the remainder of the day. A responsible adult should stay with   you for 24 hours following the procedure.  For the next 24 hours, DO NOT: -Drive a car -Paediatric nurse -Drink alcoholic beverages -Take any medication unless instructed by your physician -Make any legal decisions or sign important papers.  Meals: Start with liquid foods such as gelatin or soup. Progress to regular foods as tolerated. Avoid greasy, spicy, heavy foods. If nausea and/or vomiting occur, drink only clear liquids until the nausea and/or vomiting subsides. Call your physician if vomiting continues.  Special Instructions/Symptoms: Your throat may feel dry or sore from the anesthesia or the breathing tube placed in your throat during surgery. If this causes discomfort, gargle with warm salt water. The discomfort should disappear within 24 hours.  If you had a scopolamine patch placed behind your ear for the management of post- operative nausea and/or vomiting:  1. The medication in the patch is effective for 72 hours, after which it should be removed.  Wrap patch in a tissue and discard in the trash. Wash hands thoroughly with soap and water. 2. You may remove the patch earlier than 72 hours if you experience unpleasant side effects which may include dry mouth, dizziness or visual disturbances. 3. Avoid touching the patch. Wash your hands with soap and water after contact with the patch.    Call your surgeon if you experience:   1.  Fever over 101.0. 2.  Inability to urinate. 3.  Nausea and/or vomiting. 4.  Extreme swelling or bruising at the surgical site. 5.  Continued bleeding from the incision. 6.  Increased pain, redness or drainage from the incision. 7.  Problems related to your pain medication. 8.  Any problems and/or concerns

## 2015-08-17 NOTE — Anesthesia Postprocedure Evaluation (Signed)
Anesthesia Post Note  Patient: [name]  Procedure(s) Performed: Procedure(s) (LRB): RADIOACTIVE SEED GUIDED LUMPECTOMY WITH AXILLARY SENTINEL LYMPH NODE BIOPSY (Left)  Patient location during evaluation: PACU Anesthesia Type: General and Regional Level of consciousness: awake and alert Pain management: pain level controlled Vital Signs Assessment: post-procedure vital signs reviewed and stable Respiratory status: spontaneous breathing, nonlabored ventilation, respiratory function stable and patient connected to nasal cannula oxygen Cardiovascular status: blood pressure returned to baseline and stable Postop Assessment: no signs of nausea or vomiting Anesthetic complications: no    Last Vitals:  Filed Vitals:   08/17/15 1530 08/17/15 1545  BP: 194/82 203/95  Pulse: 90 96  Temp:    Resp: 24 23    Last Pain: There were no vitals filed for this visit.               Montez Hageman

## 2015-08-18 ENCOUNTER — Encounter: Payer: Self-pay | Admitting: Radiation Oncology

## 2015-08-18 ENCOUNTER — Encounter (HOSPITAL_BASED_OUTPATIENT_CLINIC_OR_DEPARTMENT_OTHER): Payer: Self-pay | Admitting: Surgery

## 2015-08-21 ENCOUNTER — Encounter: Payer: Self-pay | Admitting: Hematology and Oncology

## 2015-08-22 ENCOUNTER — Encounter (HOSPITAL_COMMUNITY): Payer: Self-pay

## 2015-08-22 ENCOUNTER — Ambulatory Visit (INDEPENDENT_AMBULATORY_CARE_PROVIDER_SITE_OTHER)
Admission: EM | Admit: 2015-08-22 | Discharge: 2015-08-22 | Disposition: A | Discharge status: Nursing Home (Includes ICF) | Payer: Medicare Other | Source: Home / Self Care | Attending: Family Medicine | Admitting: Family Medicine

## 2015-08-22 ENCOUNTER — Emergency Department (HOSPITAL_COMMUNITY)
Admission: EM | Admit: 2015-08-22 | Discharge: 2015-08-23 | Disposition: A | Discharge status: Home / Self Care | Payer: Medicare Other | Source: Home / Self Care

## 2015-08-22 ENCOUNTER — Ambulatory Visit (INDEPENDENT_AMBULATORY_CARE_PROVIDER_SITE_OTHER): Payer: Medicare Other

## 2015-08-22 ENCOUNTER — Encounter (HOSPITAL_COMMUNITY): Payer: Self-pay | Admitting: *Deleted

## 2015-08-22 DIAGNOSIS — I2510 Atherosclerotic heart disease of native coronary artery without angina pectoris: Secondary | ICD-10-CM | POA: Diagnosis not present

## 2015-08-22 DIAGNOSIS — Z7984 Long term (current) use of oral hypoglycemic drugs: Secondary | ICD-10-CM | POA: Diagnosis not present

## 2015-08-22 DIAGNOSIS — Z794 Long term (current) use of insulin: Secondary | ICD-10-CM | POA: Diagnosis not present

## 2015-08-22 DIAGNOSIS — Z955 Presence of coronary angioplasty implant and graft: Secondary | ICD-10-CM | POA: Diagnosis not present

## 2015-08-22 DIAGNOSIS — R05 Cough: Secondary | ICD-10-CM

## 2015-08-22 DIAGNOSIS — Z79899 Other long term (current) drug therapy: Secondary | ICD-10-CM | POA: Diagnosis not present

## 2015-08-22 DIAGNOSIS — R0602 Shortness of breath: Secondary | ICD-10-CM | POA: Diagnosis not present

## 2015-08-22 DIAGNOSIS — I739 Peripheral vascular disease, unspecified: Secondary | ICD-10-CM | POA: Diagnosis not present

## 2015-08-22 DIAGNOSIS — M79605 Pain in left leg: Secondary | ICD-10-CM | POA: Diagnosis not present

## 2015-08-22 DIAGNOSIS — Z96651 Presence of right artificial knee joint: Secondary | ICD-10-CM | POA: Diagnosis not present

## 2015-08-22 DIAGNOSIS — I10 Essential (primary) hypertension: Secondary | ICD-10-CM | POA: Insufficient documentation

## 2015-08-22 DIAGNOSIS — E119 Type 2 diabetes mellitus without complications: Secondary | ICD-10-CM | POA: Insufficient documentation

## 2015-08-22 DIAGNOSIS — Z79891 Long term (current) use of opiate analgesic: Secondary | ICD-10-CM | POA: Diagnosis not present

## 2015-08-22 DIAGNOSIS — R600 Localized edema: Secondary | ICD-10-CM | POA: Diagnosis not present

## 2015-08-22 DIAGNOSIS — Z87891 Personal history of nicotine dependence: Secondary | ICD-10-CM | POA: Diagnosis not present

## 2015-08-22 DIAGNOSIS — Z7951 Long term (current) use of inhaled steroids: Secondary | ICD-10-CM | POA: Diagnosis not present

## 2015-08-22 DIAGNOSIS — M79602 Pain in left arm: Secondary | ICD-10-CM

## 2015-08-22 DIAGNOSIS — K219 Gastro-esophageal reflux disease without esophagitis: Secondary | ICD-10-CM | POA: Diagnosis not present

## 2015-08-22 DIAGNOSIS — E785 Hyperlipidemia, unspecified: Secondary | ICD-10-CM | POA: Diagnosis not present

## 2015-08-22 DIAGNOSIS — M7989 Other specified soft tissue disorders: Secondary | ICD-10-CM | POA: Diagnosis not present

## 2015-08-22 DIAGNOSIS — M4696 Unspecified inflammatory spondylopathy, lumbar region: Secondary | ICD-10-CM | POA: Diagnosis not present

## 2015-08-22 DIAGNOSIS — F0390 Unspecified dementia without behavioral disturbance: Secondary | ICD-10-CM

## 2015-08-22 DIAGNOSIS — Z7982 Long term (current) use of aspirin: Secondary | ICD-10-CM | POA: Diagnosis not present

## 2015-08-22 DIAGNOSIS — R059 Cough, unspecified: Secondary | ICD-10-CM

## 2015-08-22 DIAGNOSIS — F200 Paranoid schizophrenia: Secondary | ICD-10-CM | POA: Diagnosis not present

## 2015-08-22 DIAGNOSIS — E1140 Type 2 diabetes mellitus with diabetic neuropathy, unspecified: Secondary | ICD-10-CM | POA: Diagnosis not present

## 2015-08-22 LAB — CBC
HCT: 35.3 % — ABNORMAL LOW (ref 36.0–46.0)
Hemoglobin: 11.1 g/dL — ABNORMAL LOW (ref 12.0–15.0)
MCH: 31.4 pg (ref 26.0–34.0)
MCHC: 31.4 g/dL (ref 30.0–36.0)
MCV: 99.7 fL (ref 78.0–100.0)
PLATELETS: 311 10*3/uL (ref 150–400)
RBC: 3.54 MIL/uL — AB (ref 3.87–5.11)
RDW: 15.8 % — AB (ref 11.5–15.5)
WBC: 11.8 10*3/uL — ABNORMAL HIGH (ref 4.0–10.5)

## 2015-08-22 LAB — BASIC METABOLIC PANEL
Anion gap: 11 (ref 5–15)
BUN: 13 mg/dL (ref 6–20)
CALCIUM: 9.3 mg/dL (ref 8.9–10.3)
CO2: 20 mmol/L — ABNORMAL LOW (ref 22–32)
CREATININE: 1.08 mg/dL — AB (ref 0.44–1.00)
Chloride: 111 mmol/L (ref 101–111)
GFR calc Af Amer: 57 mL/min — ABNORMAL LOW (ref >60)
GFR, EST NON AFRICAN AMERICAN: 49 mL/min — AB (ref >60)
GLUCOSE: 229 mg/dL — AB (ref 65–99)
Potassium: 3.4 mmol/L — ABNORMAL LOW (ref 3.5–5.1)
SODIUM: 142 mmol/L (ref 135–145)

## 2015-08-22 NOTE — ED Notes (Signed)
Pt    Reports  Symptoms  Of  Cough     Congestion           Wheezing  And  Shortness  Of  Breath  For   About     6  Weeks    She      Reports  Had  A  Biopsy   5  Days  Ago  On l  Breast     she  Also  Reports  Pain  Radiating  Down l  Leg

## 2015-08-22 NOTE — ED Notes (Signed)
Pt's name called to recheck vitals no answer 

## 2015-08-22 NOTE — ED Notes (Signed)
Patient here from [facility] to be evaluated for left leg pain. Also concerned with ongoing cough, cxr done at [facility]. Had lumpectomy done left breast last Thursday and concerned that the leg pain started yesterday

## 2015-08-22 NOTE — ED Provider Notes (Signed)
CSN: [id number]     Arrival date & time 08/22/15  1346 History   First MD Initiated Contact with Patient 08/22/15 1520     Chief Complaint  Patient presents with  . URI   HPI Pt is a 75 y.o. female with history of DM, CAD, breast cancer with lumpectomy last week who presents for cough for 3 months and leg pain since the surgery. Pt reports that for about three months she has had cough, wheezing, congestion, shortness of breath. She was put on prednisone by her primary care doctor but this did not help. She denies fevers, chills, chest pain. She also reports that last week she felt a sharp pain radiating from her left buttock down to her foot, the pain is less severe now. She hasn't noticed any swelling.  Past Medical History  Diagnosis Date  . Anemia, iron deficiency   . Anxiety   . Type II or unspecified type diabetes mellitus without mention of complication, not stated as uncontrolled   . GERD (gastroesophageal reflux disease)   . HTN (hypertension)   . Peripheral neuropathy ([place])   . Hyperlipidemia   . Allergic rhinitis   . PVD (peripheral vascular disease) ([facility])     Left subclavian 70% stenosis  . Dementia   . IBS (irritable bowel syndrome)   . Syncope     EP study 1996 w/atrial tachycardia  . Esophageal motility disorder   . Renal insufficiency   . 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 Arthritis     back  . CAD (coronary artery disease)     CAD stent placed 2005  . Pneumonia 12-09-2014    adm with CAP requiring mechanical ventilation x 10d   Past Surgical History  Procedure Laterality Date  . Abdominal hysterectomy    . Ptca  1995  . Knee arthroplasty      Right   . Breast biopsy      Negative  . Joint replacement Bilateral   . Radioactive seed guided mastectomy with axillary sentinel lymph node biopsy Left 08/17/2015    Procedure: RADIOACTIVE SEED GUIDED LUMPECTOMY WITH AXILLARY SENTINEL LYMPH NODE BIOPSY;  Surgeon: Donnie Mesa, MD;  Location: [facility];  Service: General;  Laterality: Left;   Family History  Problem Relation Age of Onset  . Schizophrenia Neg Hx   . Coronary artery disease Neg Hx   . Colon cancer Neg Hx   . Breast cancer Neg Hx   . Hypertension Other   . Diabetes Other    Social History  Substance Use Topics  . Smoking status: Former Research scientist (life sciences)  . Smokeless tobacco: Never Used  . Alcohol Use: No   OB History    No data available     Review of Systems  All other systems reviewed and are negative.  See HPI  Allergies  Codeine phosphate; Glimepiride; Glyburide; Shellfish allergy; and Tramadol  Home Medications   Prior to Admission medications   Medication Sig Start Date End Date Taking? Authorizing Provider  acetaminophen (TYLENOL) 500 MG tablet Take 500-1,000 mg by mouth 3 (three) times daily as needed. For back pain     Historical Provider, MD  amLODipine (NORVASC) 10 MG tablet Take 1 tablet (10 mg total) by mouth daily. 10/09/12   Neena Rhymes, MD  aspirin 325 MG tablet Take 325 mg by mouth daily.      Historical Provider, MD  atorvastatin (LIPITOR) 40 MG tablet Take 40 mg by mouth  daily.    Historical Provider, MD  cloZAPine (CLOZARIL) 100 MG tablet Take 1 tablet (100 mg total) by mouth at bedtime. 12/27/14   Maryellen Pile, MD  donepezil (ARICEPT) 10 MG tablet Take 10 mg by mouth at bedtime.    Historical Provider, MD  ferrous sulfate 325 (65 FE) MG tablet Take 325 mg by mouth daily with breakfast.    Historical Provider, MD  fluticasone (FLONASE) 50 MCG/ACT nasal spray Place 2 sprays into both nostrils daily as needed for allergies.  02/22/13   Historical Provider, MD  furosemide (LASIX) 40 MG tablet Take 40 mg by mouth.    Historical Provider, MD  haloperidol (HALDOL) 10 MG tablet Take 0.5 tablets (5 mg total) by mouth as needed (for agitation). 12/27/14   Maryellen Pile, MD  HYDROcodone-acetaminophen (NORCO/VICODIN) 5-325 MG tablet Take 1 tablet by mouth every 4 (four) hours as needed.  08/17/15   Donnie Mesa, MD  insulin aspart protamine- aspart (NOVOLOG MIX 70/30) (70-30) 100 UNIT/ML injection Inject 15 Units into the skin 2 (two) times daily.     Historical Provider, MD  insulin glargine (LANTUS SOLOSTAR) 100 UNIT/ML injection Inject 15 Units into the skin at bedtime. 03/21/11   Neena Rhymes, MD  levocetirizine (XYZAL) 5 MG tablet Take 5 mg by mouth every evening.  03/05/13   Historical Provider, MD  memantine (NAMENDA) 10 MG tablet Take 1 tablet (10 mg total) by mouth 2 (two) times daily. 10/09/12   Neena Rhymes, MD  mirtazapine (REMERON) 30 MG tablet Take 30 mg by mouth at bedtime.    Historical Provider, MD  Multiple Vitamins-Minerals (MULTIVITAMIN,TX-MINERALS) tablet Take 1 tablet by mouth daily.      Historical Provider, MD  omeprazole (PRILOSEC) 40 MG capsule Take 40 mg by mouth daily.    Historical Provider, MD  pioglitazone (ACTOS) 30 MG tablet Take 1 tablet (30 mg total) by mouth daily. 05/21/12   Neena Rhymes, MD  potassium chloride (KLOR-CON) 8 MEQ tablet Take 1 tablet (8 mEq total) by mouth daily. 04/28/12   Neena Rhymes, MD  ramipril (ALTACE) 10 MG capsule Take 2 capsules (20 mg total) by mouth daily. 12/27/14   Maryellen Pile, MD   Meds Ordered and Administered this Visit  Medications - No data to display  BP 161/76 mmHg  Pulse 105  Temp(Src) 98.6 F (37 C) (Oral)  Resp 14  SpO2 100% No data found.   Physical Exam  Constitutional: She is oriented to person, place, and time. She appears well-developed and well-nourished. No distress.  HENT:  Head: Normocephalic and atraumatic.  Right Ear: External ear normal.  Left Ear: External ear normal.  Nose: Mucosal edema and rhinorrhea present. Right sinus exhibits no maxillary sinus tenderness and no frontal sinus tenderness. Left sinus exhibits no maxillary sinus tenderness and no frontal sinus tenderness.  Mouth/Throat: Oropharynx is clear and moist and mucous membranes are normal.  Neck: Normal  range of motion. Neck supple.  Cardiovascular: Normal rate, regular rhythm, normal heart sounds and intact distal pulses.   No murmur heard. Pulmonary/Chest: Effort normal. No respiratory distress. She has decreased breath sounds in the left middle field and the left lower field. She has no wheezes. She has no rales.  Musculoskeletal:       Left lower leg: She exhibits tenderness and swelling. She exhibits no bony tenderness and no deformity.  Lymphadenopathy:    She has no cervical adenopathy.  Neurological: She is alert and oriented to person, place,  and time.  Skin: Skin is warm and dry. No rash noted. She is not diaphoretic. No pallor.  Psychiatric: She has a normal mood and affect. Her behavior is normal.  Nursing note and vitals reviewed.   ED Course  Procedures (including critical care time)  Labs Review Labs Reviewed - No data to display  Imaging Review Dg Chest 2 View  08/22/2015  CLINICAL DATA:  Per pt: has been having left side chest pain and SOb, slight cough, since she had a lumpectomy (left side) last week. No fever. Non-smoker. Diabetic. HBP EXAM: CHEST  2 VIEW COMPARISON:  Chest radiograph, 12/26/2014 FINDINGS: Cardiac silhouette is normal in size and configuration. No mediastinal or hilar masses or evidence of adenopathy. There are minor scarring in the right middle lobe. Lungs otherwise clear. No pleural effusion or pneumothorax. Bony thorax is demineralized but grossly intact. IMPRESSION: No acute cardiopulmonary disease. Electronically Signed   By: Lajean Manes M.D.   On: 08/22/2015 15:38      MDM  No diagnosis found.   Pt with 3 months of cough and wheezing, decreased breath sounds on the left. Obtaid chest xray to eval for possible pneumonia which was negative. Continue symptomatic treatment for likely viral bronchitis and f/u with PCP. Pt was hospitalized and intubated for 10 days from a pneumonia last year.  Pt is very high risk for DVT given cancer and recent  surgery. Swelling is minimal but does have pain and calf tenderness. Will send to ED for further eval and likely venous doppler.  Pt discussed with and examined by Dr. Juventino Slovak who agrees with evaluation and management.  Frazier Richards, MD 08/22/15 1550

## 2015-08-23 ENCOUNTER — Emergency Department (HOSPITAL_COMMUNITY)
Admission: EM | Admit: 2015-08-23 | Discharge: 2015-08-23 | Disposition: A | Discharge status: Home / Self Care | Payer: Medicare Other | Attending: Emergency Medicine | Admitting: Emergency Medicine

## 2015-08-23 ENCOUNTER — Encounter (HOSPITAL_COMMUNITY): Payer: Self-pay | Admitting: Emergency Medicine

## 2015-08-23 ENCOUNTER — Emergency Department (HOSPITAL_BASED_OUTPATIENT_CLINIC_OR_DEPARTMENT_OTHER): Payer: Medicare Other

## 2015-08-23 DIAGNOSIS — Z794 Long term (current) use of insulin: Secondary | ICD-10-CM | POA: Insufficient documentation

## 2015-08-23 DIAGNOSIS — R0602 Shortness of breath: Secondary | ICD-10-CM | POA: Insufficient documentation

## 2015-08-23 DIAGNOSIS — Z96651 Presence of right artificial knee joint: Secondary | ICD-10-CM | POA: Insufficient documentation

## 2015-08-23 DIAGNOSIS — I2510 Atherosclerotic heart disease of native coronary artery without angina pectoris: Secondary | ICD-10-CM | POA: Insufficient documentation

## 2015-08-23 DIAGNOSIS — Z87891 Personal history of nicotine dependence: Secondary | ICD-10-CM | POA: Insufficient documentation

## 2015-08-23 DIAGNOSIS — F200 Paranoid schizophrenia: Secondary | ICD-10-CM | POA: Insufficient documentation

## 2015-08-23 DIAGNOSIS — Z955 Presence of coronary angioplasty implant and graft: Secondary | ICD-10-CM | POA: Insufficient documentation

## 2015-08-23 DIAGNOSIS — I10 Essential (primary) hypertension: Secondary | ICD-10-CM | POA: Diagnosis not present

## 2015-08-23 DIAGNOSIS — M79609 Pain in unspecified limb: Secondary | ICD-10-CM

## 2015-08-23 DIAGNOSIS — K219 Gastro-esophageal reflux disease without esophagitis: Secondary | ICD-10-CM | POA: Insufficient documentation

## 2015-08-23 DIAGNOSIS — Z7982 Long term (current) use of aspirin: Secondary | ICD-10-CM | POA: Insufficient documentation

## 2015-08-23 DIAGNOSIS — M4696 Unspecified inflammatory spondylopathy, lumbar region: Secondary | ICD-10-CM | POA: Insufficient documentation

## 2015-08-23 DIAGNOSIS — M7989 Other specified soft tissue disorders: Secondary | ICD-10-CM

## 2015-08-23 DIAGNOSIS — F0390 Unspecified dementia without behavioral disturbance: Secondary | ICD-10-CM | POA: Insufficient documentation

## 2015-08-23 DIAGNOSIS — Z79899 Other long term (current) drug therapy: Secondary | ICD-10-CM | POA: Insufficient documentation

## 2015-08-23 DIAGNOSIS — R600 Localized edema: Secondary | ICD-10-CM | POA: Insufficient documentation

## 2015-08-23 DIAGNOSIS — E1140 Type 2 diabetes mellitus with diabetic neuropathy, unspecified: Secondary | ICD-10-CM | POA: Insufficient documentation

## 2015-08-23 DIAGNOSIS — E785 Hyperlipidemia, unspecified: Secondary | ICD-10-CM | POA: Insufficient documentation

## 2015-08-23 DIAGNOSIS — Z7951 Long term (current) use of inhaled steroids: Secondary | ICD-10-CM | POA: Insufficient documentation

## 2015-08-23 DIAGNOSIS — I739 Peripheral vascular disease, unspecified: Secondary | ICD-10-CM | POA: Insufficient documentation

## 2015-08-23 DIAGNOSIS — Z79891 Long term (current) use of opiate analgesic: Secondary | ICD-10-CM | POA: Insufficient documentation

## 2015-08-23 DIAGNOSIS — Z7984 Long term (current) use of oral hypoglycemic drugs: Secondary | ICD-10-CM | POA: Insufficient documentation

## 2015-08-23 LAB — CBC
HEMATOCRIT: 36.9 % (ref 36.0–46.0)
HEMOGLOBIN: 11.5 g/dL — AB (ref 12.0–15.0)
MCH: 31 pg (ref 26.0–34.0)
MCHC: 31.2 g/dL (ref 30.0–36.0)
MCV: 99.5 fL (ref 78.0–100.0)
Platelets: 310 10*3/uL (ref 150–400)
RBC: 3.71 MIL/uL — AB (ref 3.87–5.11)
RDW: 16.1 % — ABNORMAL HIGH (ref 11.5–15.5)
WBC: 11.4 10*3/uL — ABNORMAL HIGH (ref 4.0–10.5)

## 2015-08-23 LAB — BASIC METABOLIC PANEL
ANION GAP: 10 (ref 5–15)
BUN: 13 mg/dL (ref 6–20)
CHLORIDE: 110 mmol/L (ref 101–111)
CO2: 23 mmol/L (ref 22–32)
Calcium: 9.3 mg/dL (ref 8.9–10.3)
Creatinine, Ser: 0.96 mg/dL (ref 0.44–1.00)
GFR calc non Af Amer: 57 mL/min — ABNORMAL LOW (ref >60)
GLUCOSE: 236 mg/dL — AB (ref 65–99)
POTASSIUM: 4.1 mmol/L (ref 3.5–5.1)
Sodium: 143 mmol/L (ref 135–145)

## 2015-08-23 NOTE — ED Notes (Signed)
Patient here from home with complaints of left leg pain. Recent lumpectomy. Cough, SOB since Monday. Seen at primary and urgent care.

## 2015-08-23 NOTE — Discharge Instructions (Signed)
Edema °Edema is an abnormal buildup of fluids in your body tissues. Edema is somewhat dependent on gravity to pull the fluid to the lowest place in your body. That makes the condition more common in the legs and thighs (lower extremities). Painless swelling of the feet and ankles is common and becomes more likely as you get older. It is also common in looser tissues, like around your eyes.  °When the affected area is squeezed, the fluid may move out of that spot and leave a dent for a few moments. This dent is called pitting.  °CAUSES  °There are many possible causes of edema. Eating too much salt and being on your feet or sitting for a long time can cause edema in your legs and ankles. Hot weather may make edema worse. Common medical causes of edema include: °· Heart failure. °· Liver disease. °· Kidney disease. °· Weak blood vessels in your legs. °· Cancer. °· An injury. °· Pregnancy. °· Some medications. °· Obesity.  °SYMPTOMS  °Edema is usually painless. Your skin may look swollen or shiny.  °DIAGNOSIS  °Your health care provider may be able to diagnose edema by asking about your medical history and doing a physical exam. You may need to have tests such as X-rays, an electrocardiogram, or blood tests to check for medical conditions that may cause edema.  °TREATMENT  °Edema treatment depends on the cause. If you have heart, liver, or kidney disease, you need the treatment appropriate for these conditions. General treatment may include: °· Elevation of the affected body part above the level of your heart. °· Compression of the affected body part. Pressure from elastic bandages or support stockings squeezes the tissues and forces fluid back into the blood vessels. This keeps fluid from entering the tissues. °· Restriction of fluid and salt intake. °· Use of a water pill (diuretic). These medications are appropriate only for some types of edema. They pull fluid out of your body and make you urinate more often. This  gets rid of fluid and reduces swelling, but diuretics can have side effects. Only use diuretics as directed by your health care provider. °HOME CARE INSTRUCTIONS  °· Keep the affected body part above the level of your heart when you are lying down.   °· Do not sit still or stand for prolonged periods.   °· Do not put anything directly under your knees when lying down. °· Do not wear constricting clothing or garters on your upper legs.   °· Exercise your legs to work the fluid back into your blood vessels. This may help the swelling go down.   °· Wear elastic bandages or support stockings to reduce ankle swelling as directed by your health care provider.   °· Eat a low-salt diet to reduce fluid if your health care provider recommends it.   °· Only take medicines as directed by your health care provider.  °SEEK MEDICAL CARE IF:  °· Your edema is not responding to treatment. °· You have heart, liver, or kidney disease and notice symptoms of edema. °· You have edema in your legs that does not improve after elevating them.   °· You have sudden and unexplained weight gain. °SEEK IMMEDIATE MEDICAL CARE IF:  °· You develop shortness of breath or chest pain.   °· You cannot breathe when you lie down. °· You develop pain, redness, or warmth in the swollen areas.   °· You have heart, liver, or kidney disease and suddenly get edema. °· You have a fever and your symptoms suddenly get worse. °MAKE SURE YOU:  °·   Understand these instructions.  Will watch your condition.  Will get help right away if you are not doing well or get worse.   This information is not intended to replace advice given to you by your health care provider. Make sure you discuss any questions you have with your health care provider.   Document Released: 04/08/2005 Document Revised: 04/29/2014 Document Reviewed: 01/29/2013 Elsevier Interactive Patient Education 2016 [employer].  Hypertension Hypertension, commonly called high blood pressure, is  when the force of blood pumping through your arteries is too strong. Your arteries are the blood vessels that carry blood from your heart throughout your body. A blood pressure reading consists of a higher number over a lower number, such as 110/72. The higher number (systolic) is the pressure inside your arteries when your heart pumps. The lower number (diastolic) is the pressure inside your arteries when your heart relaxes. Ideally you want your blood pressure below 120/80. Hypertension forces your heart to work harder to pump blood. Your arteries may become narrow or stiff. Having untreated or uncontrolled hypertension can cause heart attack, stroke, kidney disease, and other problems. RISK FACTORS Some risk factors for high blood pressure are controllable. Others are not.  Risk factors you cannot control include:   Race. You may be at higher risk if you are African American.  Age. Risk increases with age.  Gender. Men are at higher risk than women before age 3 years. After age 74, women are at higher risk than men. Risk factors you can control include:  Not getting enough exercise or physical activity.  Being overweight.  Getting too much fat, sugar, calories, or salt in your diet.  Drinking too much alcohol. SIGNS AND SYMPTOMS Hypertension does not usually cause signs or symptoms. Extremely high blood pressure (hypertensive crisis) may cause headache, anxiety, shortness of breath, and nosebleed. DIAGNOSIS To check if you have hypertension, your health care provider will measure your blood pressure while you are seated, with your arm held at the level of your heart. It should be measured at least twice using the same arm. Certain conditions can cause a difference in blood pressure between your right and left arms. A blood pressure reading that is higher than normal on one occasion does not mean that you need treatment. If it is not clear whether you have high blood pressure, you may be  asked to return on a different day to have your blood pressure checked again. Or, you may be asked to monitor your blood pressure at home for 1 or more weeks. TREATMENT Treating high blood pressure includes making lifestyle changes and possibly taking medicine. Living a healthy lifestyle can help lower high blood pressure. You may need to change some of your habits. Lifestyle changes may include:  Following the DASH diet. This diet is high in fruits, vegetables, and whole grains. It is low in salt, red meat, and added sugars.  Keep your sodium intake below 2,300 mg per day.  Getting at least 30-45 minutes of aerobic exercise at least 4 times per week.  Losing weight if necessary.  Not smoking.  Limiting alcoholic beverages.  Learning ways to reduce stress. Your health care provider may prescribe medicine if lifestyle changes are not enough to get your blood pressure under control, and if one of the following is true:  You are 18-69 years of age and your systolic blood pressure is above 140.  You are 49 years of age or older, and your systolic blood pressure is  above 150.  Your diastolic blood pressure is above 90.  You have diabetes, and your systolic blood pressure is over [id number] or your diastolic blood pressure is over 90.  You have kidney disease and your blood pressure is above 140/90.  You have heart disease and your blood pressure is above 140/90. Your personal target blood pressure may vary depending on your medical conditions, your age, and other factors. HOME CARE INSTRUCTIONS  Have your blood pressure rechecked as directed by your health care provider.   Take medicines only as directed by your health care provider. Follow the directions carefully. Blood pressure medicines must be taken as prescribed. The medicine does not work as well when you skip doses. Skipping doses also puts you at risk for problems.  Do not smoke.   Monitor your blood pressure at home as  directed by your health care provider. SEEK MEDICAL CARE IF:   You think you are having a reaction to medicines taken.  You have recurrent headaches or feel dizzy.  You have swelling in your ankles.  You have trouble with your vision. SEEK IMMEDIATE MEDICAL CARE IF:  You develop a severe headache or confusion.  You have unusual weakness, numbness, or feel faint.  You have severe chest or abdominal pain.  You vomit repeatedly.  You have trouble breathing. MAKE SURE YOU:   Understand these instructions.  Will watch your condition.  Will get help right away if you are not doing well or get worse.   This information is not intended to replace advice given to you by your health care provider. Make sure you discuss any questions you have with your health care provider.   Document Released: 04/08/2005 Document Revised: 08/23/2014 Document Reviewed: 01/29/2013 Elsevier Interactive Patient Education [employer].

## 2015-08-23 NOTE — Progress Notes (Signed)
*  Preliminary Results* Left lower extremity venous duplex completed. Left lower extremity is negative for deep vein thrombosis. There is no evidence of left Baker's cyst.  08/23/2015 10:53 AM  Maudry Mayhew, RVT, RDCS, RDMS

## 2015-08-23 NOTE — ED Provider Notes (Signed)
CSN: [id number]     Arrival date & time 08/23/15  [id number] History   First MD Initiated Contact with Patient 08/23/15 [id number]     Chief Complaint  Patient presents with  . Leg Pain  . Cough  . Shortness of Breath    HPI Patient presents to the emergency room with complaints of leg swelling. Patient had lumpectomy surgery on her breast performed on April 27. Since that time she's experienced some pain in her left leg that goes from her toes up towards her thigh.. Pain is sharp and intermittent. She also has felt that that leg is more swollen than the other. She denies any chest pain or shortness of breath.  Prior to her leg complaint she had been having trouble with a cough and congestion that started several months ago. She's been coughing she's had wheezing, she's had nasal congestion and she intermittently has felt short of breath. She saw her primary doctor and was started on prednisone but did not feel like that helped much. She went to the urgent care yesterday and was evaluated for these issues. They did a chest x-ray that did not show any acute abnormalities. The recommended evaluation of her leg to evaluate for DVT. Patient did not go to [facility] emergency room last evening because the wait was too long. She came this morning to have the leg issue evaluated further Past Medical History  Diagnosis Date  . Anemia, iron deficiency   . Anxiety   . Type II or unspecified type diabetes mellitus without mention of complication, not stated as uncontrolled   . GERD (gastroesophageal reflux disease)   . HTN (hypertension)   . Peripheral neuropathy ([place])   . Hyperlipidemia   . Allergic rhinitis   . PVD (peripheral vascular disease) (HCC)     Left subclavian 70% stenosis  . Dementia   . IBS (irritable bowel syndrome)   . Syncope     EP study 1996 w/atrial tachycardia  . Esophageal motility disorder   . Renal insufficiency   . 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 Arthritis    back  . CAD (coronary artery disease)     CAD stent placed 2005  . Pneumonia 12-09-2014    adm with CAP requiring mechanical ventilation x 10d   Past Surgical History  Procedure Laterality Date  . Abdominal hysterectomy    . Ptca  1995  . Knee arthroplasty      Right   . Breast biopsy      Negative  . Joint replacement Bilateral   . Radioactive seed guided mastectomy with axillary sentinel lymph node biopsy Left 08/17/2015    Procedure: RADIOACTIVE SEED GUIDED LUMPECTOMY WITH AXILLARY SENTINEL LYMPH NODE BIOPSY;  Surgeon: Donnie Mesa, MD;  Location: [facility];  Service: General;  Laterality: Left;   Family History  Problem Relation Age of Onset  . Schizophrenia Neg Hx   . Coronary artery disease Neg Hx   . Colon cancer Neg Hx   . Breast cancer Neg Hx   . Hypertension Other   . Diabetes Other    Social History  Substance Use Topics  . Smoking status: Former Research scientist (life sciences)  . Smokeless tobacco: Never Used  . Alcohol Use: No   OB History    No data available     Review of Systems  All other systems reviewed and are negative.     Allergies  Codeine phosphate; Glimepiride; Glyburide; Shellfish allergy; and Tramadol  Home Medications   Prior to Admission medications   Medication Sig Start Date End Date Taking? Authorizing Provider  acetaminophen (TYLENOL) 500 MG tablet Take 500-1,000 mg by mouth 3 (three) times daily as needed. For back pain    Yes Historical Provider, MD  amLODipine (NORVASC) 10 MG tablet Take 1 tablet (10 mg total) by mouth daily. 10/09/12  Yes Neena Rhymes, MD  aspirin 325 MG tablet Take 325 mg by mouth daily.     Yes Historical Provider, MD  atorvastatin (LIPITOR) 40 MG tablet Take 40 mg by mouth daily.   Yes Historical Provider, MD  cloZAPine (CLOZARIL) 100 MG tablet Take 1 tablet (100 mg total) by mouth at bedtime. Patient taking differently: Take 150 mg by mouth at bedtime.  12/27/14  Yes Maryellen Pile, MD  donepezil (ARICEPT) 10 MG  tablet Take 10 mg by mouth at bedtime.   Yes Historical Provider, MD  esomeprazole (NEXIUM) 40 MG capsule Take 40 mg by mouth daily.  06/23/15  Yes Historical Provider, MD  ferrous sulfate 325 (65 FE) MG tablet Take 325 mg by mouth daily with breakfast.   Yes Historical Provider, MD  fluticasone (FLONASE) 50 MCG/ACT nasal spray Place 2 sprays into both nostrils daily as needed for allergies.  02/22/13  Yes Historical Provider, MD  furosemide (LASIX) 40 MG tablet Take 20 mg by mouth daily.    Yes Historical Provider, MD  haloperidol (HALDOL) 5 MG tablet Take 5-10 mg by mouth 2 (two) times daily. Take 1 in the morning and 2 at night   Yes Historical Provider, MD  HYDROcodone-acetaminophen (NORCO/VICODIN) 5-325 MG tablet Take 1 tablet by mouth every 4 (four) hours as needed. 08/17/15  Yes Donnie Mesa, MD  insulin glargine (LANTUS SOLOSTAR) 100 UNIT/ML injection Inject 15 Units into the skin at bedtime. 03/21/11  Yes Neena Rhymes, MD  levocetirizine (XYZAL) 5 MG tablet Take 5 mg by mouth every evening.  03/05/13  Yes Historical Provider, MD  memantine (NAMENDA) 10 MG tablet Take 1 tablet (10 mg total) by mouth 2 (two) times daily. 10/09/12  Yes Neena Rhymes, MD  mirtazapine (REMERON) 30 MG tablet Take 30 mg by mouth at bedtime.   Yes Historical Provider, MD  Multiple Vitamins-Minerals (MULTIVITAMIN,TX-MINERALS) tablet Take 1 tablet by mouth daily.     Yes Historical Provider, MD  omeprazole (PRILOSEC) 40 MG capsule Take 40 mg by mouth daily.   Yes Historical Provider, MD  pioglitazone (ACTOS) 30 MG tablet Take 1 tablet (30 mg total) by mouth daily. 05/21/12  Yes Neena Rhymes, MD  potassium chloride (KLOR-CON) 8 MEQ tablet Take 1 tablet (8 mEq total) by mouth daily. 04/28/12  Yes Neena Rhymes, MD  ramipril (ALTACE) 10 MG capsule Take 2 capsules (20 mg total) by mouth daily. 12/27/14  Yes Maryellen Pile, MD   BP 194/70 mmHg  Pulse 109  Resp 17  Ht 5\' 3"  (1.6 m)  Wt 71.668 kg  BMI 28.00 kg/m2   SpO2 100% Physical Exam  Constitutional: She appears well-developed and well-nourished. No distress.  HENT:  Head: Normocephalic and atraumatic.  Right Ear: External ear normal.  Left Ear: External ear normal.  Eyes: Conjunctivae are normal. Right eye exhibits no discharge. Left eye exhibits no discharge. No scleral icterus.  Neck: Neck supple. No tracheal deviation present.  Cardiovascular: Normal rate, regular rhythm and intact distal pulses.   Pulmonary/Chest: Effort normal and breath sounds normal. No stridor. No respiratory distress. She has no wheezes. She  has no rales.  Abdominal: Soft. Bowel sounds are normal. She exhibits no distension. There is no tenderness. There is no rebound and no guarding.  Musculoskeletal: She exhibits edema. She exhibits no tenderness.  Trace edema of the left ankle and left foot similar to the right side, no calf tenderness or thigh tenderness to the left calf or thigh  Neurological: She is alert. She has normal strength. No cranial nerve deficit (no facial droop, extraocular movements intact, no slurred speech) or sensory deficit. She exhibits normal muscle tone. She displays no seizure activity. Coordination normal.  Skin: Skin is warm and dry. No rash noted.  Psychiatric: She has a normal mood and affect.  Nursing note and vitals reviewed.   ED Course  Procedures (including critical care time) Labs Review Labs Reviewed  BASIC METABOLIC PANEL - Abnormal; Notable for the following:    Glucose, Bld 236 (*)    GFR calc non Af Amer 57 (*)    All other components within normal limits  CBC - Abnormal; Notable for the following:    WBC 11.4 (*)    RBC 3.71 (*)    Hemoglobin 11.5 (*)    RDW 16.1 (*)    All other components within normal limits    Imaging Review Dg Chest 2 View  08/22/2015  CLINICAL DATA:  Per pt: has been having left side chest pain and SOb, slight cough, since she had a lumpectomy (left side) last week. No fever. Non-smoker.  Diabetic. HBP EXAM: CHEST  2 VIEW COMPARISON:  Chest radiograph, 12/26/2014 FINDINGS: Cardiac silhouette is normal in size and configuration. No mediastinal or hilar masses or evidence of adenopathy. There are minor scarring in the right middle lobe. Lungs otherwise clear. No pleural effusion or pneumothorax. Bony thorax is demineralized but grossly intact. IMPRESSION: No acute cardiopulmonary disease. Electronically Signed   By: Lajean Manes M.D.   On: 08/22/2015 15:38   I have personally reviewed and evaluated these images and lab results as part of my medical decision-making.   MDM   Final diagnoses:  Leg swelling  Essential hypertension   Doppler study was negative for DVT. Patient is stable for discharge. Recommend follow-up with her primary doctor.  Her blood pressure has been elevated recently and her meds may need to be adjusted.   Dorie Rank, MD 08/23/15 [phone]

## 2015-08-23 NOTE — ED Notes (Signed)
MD at bedside. 

## 2015-08-24 ENCOUNTER — Ambulatory Visit: Payer: Self-pay | Admitting: Surgery

## 2015-08-24 ENCOUNTER — Telehealth: Payer: Self-pay | Admitting: Hematology and Oncology

## 2015-08-24 NOTE — Telephone Encounter (Signed)
Spoke with patient dtr [name] on 5/1 re new patient appointment with [facility] 5/9 @ 3:45 pm to arrive 3:15 pm. Patient demographic and insurance information confirmed at that time. Dtr called today to inform that she wanted to keep appointment as scheduled for 5/9 as she left a message asking that the appointment be cxd, however after speaking with Dr. Georgette Dover they will keep appointment - appointment remains as scheduled for 5/9.

## 2015-08-29 ENCOUNTER — Encounter: Payer: Self-pay | Admitting: *Deleted

## 2015-08-29 ENCOUNTER — Ambulatory Visit (HOSPITAL_BASED_OUTPATIENT_CLINIC_OR_DEPARTMENT_OTHER): Payer: Medicare Other | Admitting: Hematology and Oncology

## 2015-08-29 ENCOUNTER — Encounter: Payer: Self-pay | Admitting: Hematology and Oncology

## 2015-08-29 VITALS — BP 154/75 | HR 110 | Temp 97.4°F | Resp 18 | Ht 60.0 in | Wt 158.0 lb

## 2015-08-29 DIAGNOSIS — C50412 Malignant neoplasm of upper-outer quadrant of left female breast: Secondary | ICD-10-CM

## 2015-08-29 DIAGNOSIS — Z170 Estrogen receptor positive status [ER+]: Secondary | ICD-10-CM | POA: Diagnosis not present

## 2015-08-29 NOTE — Progress Notes (Signed)
[facility] CONSULT NOTE  Patient Care Team: Dibas Koirala, MD as PCP - General (Family Medicine)  CHIEF COMPLAINTS/PURPOSE OF CONSULTATION:  Newly diagnosed breast cancer  HISTORY OF PRESENTING ILLNESS:  [name] 75 y.o. female is here because of recent diagnosis of left breast cancer. She had a breast exam by her primary care physician who felt a lump and immediately referred her to a mammogram. 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 She saw Dr. Georgette Dover who performed a lumpectomy on 08/14/2015 and pathology came back as invasive ductal carcinoma grade 2 measuring 2.3 cm that was weakly ER +30% but PR 0% HER-2 negative and Ki-67 15%. One lymph node was negative. She was sent to [place] for discussion regarding adjuvant treatment options.  I reviewed her records extensively and collaborated the history with the patient.  SUMMARY OF ONCOLOGIC HISTORY:   Breast cancer of upper-outer quadrant of left female breast ([place])   08/14/2015 Surgery Left lumpectomy: IDC grade 2, 2.3 cm, IDC involves inferior and medial margins, 0/1 lymph nodes negative, ER 30% weak (on Biopsy ER 0%, PR 0%), HER-2 negative duration 1.53, Ki-67 15%, T2 N0 stage II a    MEDICAL HISTORY:  Past Medical History  Diagnosis Date  . Anemia, iron deficiency   . Anxiety   . Type II or unspecified type diabetes mellitus without mention of complication, not stated as uncontrolled   . GERD (gastroesophageal reflux disease)   . HTN (hypertension)   . Peripheral neuropathy ([place])   . Hyperlipidemia   . Allergic rhinitis   . PVD (peripheral vascular disease) (HCC)     Left subclavian 70% stenosis  . Dementia   . IBS (irritable bowel syndrome)   . Syncope     EP study 1996 w/atrial tachycardia  . Esophageal motility disorder   . Renal insufficiency   . 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 Arthritis     back  . CAD (coronary artery  disease)     CAD stent placed 2005  . Pneumonia 12-09-2014    adm with CAP requiring mechanical ventilation x 10d    SURGICAL HISTORY: Past Surgical History  Procedure Laterality Date  . Abdominal hysterectomy    . Ptca  1995  . Knee arthroplasty      Right   . Breast biopsy      Negative  . Joint replacement Bilateral   . Radioactive seed guided mastectomy with axillary sentinel lymph node biopsy Left 08/17/2015    Procedure: RADIOACTIVE SEED GUIDED LUMPECTOMY WITH AXILLARY SENTINEL LYMPH NODE BIOPSY;  Surgeon: Donnie Mesa, MD;  Location: [facility];  Service: General;  Laterality: Left;    SOCIAL HISTORY: Social History   Social History  . Marital Status: Single    Spouse Name: N/A  . Number of Children: N/A  . Years of Education: N/A   Occupational History  . Not on file.   Social History Main Topics  . Smoking status: Former Research scientist (life sciences)  . Smokeless tobacco: Never Used  . Alcohol Use: No  . Drug Use: No  . Sexual Activity: Not on file   Other Topics Concern  . Not on file   Social History Narrative   11th grade education  Never married  5 children: 4 daughters, 1 son, 7 grandchildren  Lives alone  Has supportive family  FAMILY HISTORY: Family History  Problem Relation Age of Onset  . Schizophrenia Neg Hx   . Coronary artery disease Neg Hx   . Colon cancer Neg Hx   . Breast cancer Neg Hx   . Hypertension Other   . Diabetes Other     ALLERGIES:  is allergic to codeine phosphate; glimepiride; glyburide; shellfish allergy; and tramadol.  MEDICATIONS:  Current Outpatient Prescriptions  Medication Sig Dispense Refill  . acetaminophen (TYLENOL) 500 MG tablet Take 500-1,000 mg by mouth 3 (three) times daily as needed. For back pain     . amLODipine (NORVASC) 10 MG tablet Take 1 tablet (10 mg total) by mouth daily. 30 tablet 0  . aspirin 325 MG tablet Take 325 mg by mouth daily.      Marland Kitchen atorvastatin (LIPITOR) 40 MG tablet Take 40  mg by mouth daily.    . cloZAPine (CLOZARIL) 100 MG tablet Take 1 tablet (100 mg total) by mouth at bedtime. (Patient taking differently: Take 150 mg by mouth at bedtime. ) 30 tablet 0  . donepezil (ARICEPT) 10 MG tablet Take 10 mg by mouth at bedtime.    Marland Kitchen esomeprazole (NEXIUM) 40 MG capsule Take 40 mg by mouth daily.     . ferrous sulfate 325 (65 FE) MG tablet Take 325 mg by mouth daily with breakfast.    . fluticasone (FLONASE) 50 MCG/ACT nasal spray Place 2 sprays into both nostrils daily as needed for allergies.     . furosemide (LASIX) 40 MG tablet Take 20 mg by mouth daily.     . haloperidol (HALDOL) 5 MG tablet Take 5-10 mg by mouth 2 (two) times daily. Take 1 in the morning and 2 at night    . HYDROcodone-acetaminophen (NORCO/VICODIN) 5-325 MG tablet Take 1 tablet by mouth every 4 (four) hours as needed. 40 tablet 0  . insulin glargine (LANTUS SOLOSTAR) 100 UNIT/ML injection Inject 15 Units into the skin at bedtime. 15 mL 6  . levocetirizine (XYZAL) 5 MG tablet Take 5 mg by mouth every evening.     . memantine (NAMENDA) 10 MG tablet Take 1 tablet (10 mg total) by mouth 2 (two) times daily. 60 tablet 0  . mirtazapine (REMERON) 30 MG tablet Take 30 mg by mouth at bedtime.    . Multiple Vitamins-Minerals (MULTIVITAMIN,TX-MINERALS) tablet Take 1 tablet by mouth daily.      Marland Kitchen omeprazole (PRILOSEC) 40 MG capsule Take 40 mg by mouth daily.    . pioglitazone (ACTOS) 30 MG tablet Take 1 tablet (30 mg total) by mouth daily. 30 tablet 1  . potassium chloride (KLOR-CON) 8 MEQ tablet Take 1 tablet (8 mEq total) by mouth daily. 30 tablet 6  . ramipril (ALTACE) 10 MG capsule Take 2 capsules (20 mg total) by mouth daily. 60 capsule 0   No current facility-administered medications for this visit.    REVIEW OF SYSTEMS:   Constitutional: Denies fevers, chills or abnormal night sweats Eyes: Denies blurriness of vision, double vision or watery eyes Ears, nose, mouth, throat, and face: Denies mucositis  or sore throat Respiratory: Denies cough, dyspnea or wheezes Cardiovascular: Denies palpitation, chest discomfort or lower extremity swelling Gastrointestinal:  Complaints of stomach discomfort Skin: Denies abnormal skin rashes Lymphatics: Denies new lymphadenopathy or easy bruising Neurological:generalized weakness Behavioral/Psych: problems with mood instability and depression Breast: soreness from recent surgery All other systems were reviewed with the patient and are negative.  PHYSICAL EXAMINATION: ECOG PERFORMANCE STATUS: 2 - Symptomatic, <50% confined to bed  Filed Vitals:   08/29/15 1538  BP: 154/75  Pulse: 110  Temp: 97.4 F (36.3 C)  Resp: 18   Filed Weights   08/29/15 1538  Weight: 158 lb (71.668 kg)    GENERAL:alert, no distress and comfortable SKIN: skin color, texture, turgor are normal, no rashes or significant lesions EYES: normal, conjunctiva are pink and non-injected, sclera clear OROPHARYNX:no exudate, no erythema and lips, buccal mucosa, and tongue normal  NECK: supple, thyroid normal size, non-tender, without nodularity LYMPH:  no palpable lymphadenopathy in the cervical, axillary or inguinal LUNGS: clear to auscultation and percussion with normal breathing effort HEART: regular rate & rhythm and no murmurs and no lower extremity edema ABDOMEN:abdomen soft, non-tender and normal bowel sounds Musculoskeletal:no cyanosis of digits and no clubbing  PSYCH: alert & oriented x 3 with fluent speech NEURO: no focal motor/sensory deficits  LABORATORY DATA:  I have reviewed the data as listed Lab Results  Component Value Date   WBC 11.4* 08/23/2015   HGB 11.5* 08/23/2015   HCT 36.9 08/23/2015   MCV 99.5 08/23/2015   PLT 310 08/23/2015   Lab Results  Component Value Date   NA 143 08/23/2015   K 4.1 08/23/2015   CL 110 08/23/2015   CO2 23 08/23/2015    RADIOGRAPHIC STUDIES: I have personally reviewed the radiological reports and agreed with the  findings in the report.  ASSESSMENT AND PLAN:  Breast cancer of upper-outer quadrant of left female breast ([place]) Left lumpectomy 08/14/2015: IDC grade 2, 2.3 cm, IDC involves inferior and medial margins, 0/1 lymph nodes negative, ER 30% weak (on Biopsy ER 0%, PR 0%), HER-2 negative duration 1.53, Ki-67 15%, T2 N0 stage II a  Pathology counseling: I discussed the final pathology report of the patient provided  a copy of this report. I discussed the margins as well as lymph node surgeries. We also discussed the final staging along with previously performed ER/PR and HER-2/neu testing.  Recommendation: 1. Resection of the margins 2. Followed by adjuvant radiation  Patient's health status is relatively frail and hence I do not believe she is a candidate for chemotherapy. Because of her frail emotional and mental status, I did not even recommend antiestrogen therapy. Her tumor is very faintly ER positive although on the biopsy was felt to be ER negative. I discussed the pros and cons of antiestrogen therapy. We will make the final decision regarding antiestrogen therapy after she returns back to see me following radiation therapy.  Alternately she could also be followed by survivorship clinic  All questions were answered. The patient knows to call the clinic with any problems, questions or concerns.    Rulon Eisenmenger, MD 08/29/2015

## 2015-08-29 NOTE — Assessment & Plan Note (Signed)
Left lumpectomy 08/14/2015: IDC grade 2, 2.3 cm, IDC involves inferior and medial margins, 0/1 lymph nodes negative, ER 30% weak (on Biopsy ER 0%, PR 0%), HER-2 negative duration 1.53, Ki-67 15%, T2 N0 stage II a  Pathology counseling: I discussed the final pathology report of the patient provided  a copy of this report. I discussed the margins as well as lymph node surgeries. We also discussed the final staging along with previously performed ER/PR and HER-2/neu testing.  Recommendation: 1. Resection of the margins 2. Oncotype DX testing 3. Followed by adjuvant radiation 4. Followed by adjuvant antiestrogen therapy  Return to clinic based upon Oncotype DX test results

## 2015-09-05 ENCOUNTER — Encounter (HOSPITAL_BASED_OUTPATIENT_CLINIC_OR_DEPARTMENT_OTHER): Payer: Self-pay | Admitting: *Deleted

## 2015-09-07 ENCOUNTER — Encounter (HOSPITAL_BASED_OUTPATIENT_CLINIC_OR_DEPARTMENT_OTHER): Payer: Self-pay

## 2015-09-07 ENCOUNTER — Ambulatory Visit: Payer: Medicare Other

## 2015-09-07 ENCOUNTER — Ambulatory Visit
Admission: RE | Admit: 2015-09-07 | Discharge: 2015-09-07 | Disposition: A | Discharge status: Home / Self Care | Payer: Medicare Other | Source: Ambulatory Visit | Attending: Radiation Oncology | Admitting: Radiation Oncology

## 2015-09-07 ENCOUNTER — Encounter (HOSPITAL_BASED_OUTPATIENT_CLINIC_OR_DEPARTMENT_OTHER)
Admission: RE | Disposition: A | Discharge status: Home / Self Care | Payer: Self-pay | Source: Ambulatory Visit | Attending: Surgery

## 2015-09-07 ENCOUNTER — Ambulatory Visit (HOSPITAL_BASED_OUTPATIENT_CLINIC_OR_DEPARTMENT_OTHER): Payer: Medicare Other | Admitting: Anesthesiology

## 2015-09-07 ENCOUNTER — Ambulatory Visit: Admission: RE | Admit: 2015-09-07 | Payer: Medicare Other | Source: Ambulatory Visit | Admitting: Radiation Oncology

## 2015-09-07 ENCOUNTER — Ambulatory Visit (HOSPITAL_BASED_OUTPATIENT_CLINIC_OR_DEPARTMENT_OTHER)
Admission: RE | Admit: 2015-09-07 | Discharge: 2015-09-07 | Disposition: A | Discharge status: Home / Self Care | Payer: Medicare Other | Source: Ambulatory Visit | Attending: Surgery | Admitting: Surgery

## 2015-09-07 ENCOUNTER — Ambulatory Visit: Payer: Medicare Other | Admitting: Radiation Oncology

## 2015-09-07 DIAGNOSIS — Z794 Long term (current) use of insulin: Secondary | ICD-10-CM | POA: Diagnosis not present

## 2015-09-07 DIAGNOSIS — I2510 Atherosclerotic heart disease of native coronary artery without angina pectoris: Secondary | ICD-10-CM | POA: Insufficient documentation

## 2015-09-07 DIAGNOSIS — Z79899 Other long term (current) drug therapy: Secondary | ICD-10-CM | POA: Insufficient documentation

## 2015-09-07 DIAGNOSIS — Z6830 Body mass index (BMI) 30.0-30.9, adult: Secondary | ICD-10-CM | POA: Diagnosis not present

## 2015-09-07 DIAGNOSIS — E669 Obesity, unspecified: Secondary | ICD-10-CM | POA: Insufficient documentation

## 2015-09-07 DIAGNOSIS — Z7951 Long term (current) use of inhaled steroids: Secondary | ICD-10-CM | POA: Insufficient documentation

## 2015-09-07 DIAGNOSIS — Z7982 Long term (current) use of aspirin: Secondary | ICD-10-CM | POA: Insufficient documentation

## 2015-09-07 DIAGNOSIS — E1151 Type 2 diabetes mellitus with diabetic peripheral angiopathy without gangrene: Secondary | ICD-10-CM | POA: Diagnosis not present

## 2015-09-07 DIAGNOSIS — K219 Gastro-esophageal reflux disease without esophagitis: Secondary | ICD-10-CM | POA: Diagnosis not present

## 2015-09-07 DIAGNOSIS — C50412 Malignant neoplasm of upper-outer quadrant of left female breast: Secondary | ICD-10-CM | POA: Insufficient documentation

## 2015-09-07 DIAGNOSIS — C50911 Malignant neoplasm of unspecified site of right female breast: Secondary | ICD-10-CM | POA: Diagnosis not present

## 2015-09-07 DIAGNOSIS — N6012 Diffuse cystic mastopathy of left breast: Secondary | ICD-10-CM | POA: Diagnosis not present

## 2015-09-07 DIAGNOSIS — F329 Major depressive disorder, single episode, unspecified: Secondary | ICD-10-CM | POA: Insufficient documentation

## 2015-09-07 DIAGNOSIS — I10 Essential (primary) hypertension: Secondary | ICD-10-CM | POA: Diagnosis not present

## 2015-09-07 DIAGNOSIS — Z87891 Personal history of nicotine dependence: Secondary | ICD-10-CM | POA: Diagnosis not present

## 2015-09-07 DIAGNOSIS — E7800 Pure hypercholesterolemia, unspecified: Secondary | ICD-10-CM | POA: Insufficient documentation

## 2015-09-07 DIAGNOSIS — Z170 Estrogen receptor positive status [ER+]: Secondary | ICD-10-CM | POA: Insufficient documentation

## 2015-09-07 DIAGNOSIS — C50912 Malignant neoplasm of unspecified site of left female breast: Secondary | ICD-10-CM | POA: Diagnosis not present

## 2015-09-07 DIAGNOSIS — I739 Peripheral vascular disease, unspecified: Secondary | ICD-10-CM | POA: Diagnosis not present

## 2015-09-07 HISTORY — PX: RE-EXCISION OF BREAST LUMPECTOMY: SHX6048

## 2015-09-07 HISTORY — DX: Malignant (primary) neoplasm, unspecified: C80.1

## 2015-09-07 LAB — GLUCOSE, CAPILLARY: Glucose-Capillary: 115 mg/dL — ABNORMAL HIGH (ref 65–99)

## 2015-09-07 SURGERY — EXCISION, LESION, BREAST
Anesthesia: General | Site: Breast | Laterality: Left

## 2015-09-07 MED ORDER — DEXAMETHASONE SODIUM PHOSPHATE 10 MG/ML IJ SOLN
INTRAMUSCULAR | Status: AC
  Filled 2015-09-07: qty 1

## 2015-09-07 MED ORDER — DEXAMETHASONE SODIUM PHOSPHATE 4 MG/ML IJ SOLN
INTRAMUSCULAR | Status: DC | PRN
  Administered 2015-09-07: 8 mg via INTRAVENOUS

## 2015-09-07 MED ORDER — FENTANYL CITRATE (PF) 100 MCG/2ML IJ SOLN
50.0000 ug | INTRAMUSCULAR | Status: DC | PRN
  Administered 2015-09-07 (×2): 50 ug via INTRAVENOUS

## 2015-09-07 MED ORDER — PROPOFOL 10 MG/ML IV BOLUS
INTRAVENOUS | Status: AC
  Filled 2015-09-07: qty 20

## 2015-09-07 MED ORDER — SCOPOLAMINE 1 MG/3DAYS TD PT72: 1.0000 | MEDICATED_PATCH | Freq: Once | TRANSDERMAL | Status: DC | PRN

## 2015-09-07 MED ORDER — PROPOFOL 10 MG/ML IV BOLUS
INTRAVENOUS | Status: DC | PRN
  Administered 2015-09-07: 30 mg via INTRAVENOUS
  Administered 2015-09-07: 100 mg via INTRAVENOUS

## 2015-09-07 MED ORDER — CHLORHEXIDINE GLUCONATE 4 % EX LIQD: 1.0000 "application " | Freq: Once | CUTANEOUS | Status: DC

## 2015-09-07 MED ORDER — GLYCOPYRROLATE 0.2 MG/ML IJ SOLN: 0.2000 mg | Freq: Once | INTRAMUSCULAR | Status: DC | PRN

## 2015-09-07 MED ORDER — CEFAZOLIN SODIUM-DEXTROSE 2-4 GM/100ML-% IV SOLN
INTRAVENOUS | Status: AC
  Filled 2015-09-07: qty 100

## 2015-09-07 MED ORDER — MORPHINE SULFATE (PF) 2 MG/ML IV SOLN: 2.0000 mg | INTRAVENOUS | Status: DC | PRN

## 2015-09-07 MED ORDER — MIDAZOLAM HCL 2 MG/2ML IJ SOLN: 1.0000 mg | INTRAMUSCULAR | Status: DC | PRN

## 2015-09-07 MED ORDER — LACTATED RINGERS IV SOLN
INTRAVENOUS | Status: DC
  Administered 2015-09-07: 11:00:00 via INTRAVENOUS

## 2015-09-07 MED ORDER — CEFAZOLIN SODIUM-DEXTROSE 2-4 GM/100ML-% IV SOLN
2.0000 g | INTRAVENOUS | Status: AC
  Administered 2015-09-07: 2 g via INTRAVENOUS

## 2015-09-07 MED ORDER — ONDANSETRON HCL 4 MG/2ML IJ SOLN
INTRAMUSCULAR | Status: DC | PRN
  Administered 2015-09-07: 4 mg via INTRAVENOUS

## 2015-09-07 MED ORDER — FENTANYL CITRATE (PF) 100 MCG/2ML IJ SOLN
INTRAMUSCULAR | Status: AC
  Filled 2015-09-07: qty 2

## 2015-09-07 MED ORDER — FENTANYL CITRATE (PF) 100 MCG/2ML IJ SOLN: 25.0000 ug | INTRAMUSCULAR | Status: DC | PRN

## 2015-09-07 MED ORDER — LIDOCAINE 2% (20 MG/ML) 5 ML SYRINGE
INTRAMUSCULAR | Status: AC
  Filled 2015-09-07: qty 5

## 2015-09-07 MED ORDER — MEPERIDINE HCL 25 MG/ML IJ SOLN: 6.2500 mg | INTRAMUSCULAR | Status: DC | PRN

## 2015-09-07 MED ORDER — LIDOCAINE 2% (20 MG/ML) 5 ML SYRINGE
INTRAMUSCULAR | Status: DC | PRN
  Administered 2015-09-07: 80 mg via INTRAVENOUS

## 2015-09-07 MED ORDER — ONDANSETRON HCL 4 MG/2ML IJ SOLN: 4.0000 mg | INTRAMUSCULAR | Status: DC | PRN

## 2015-09-07 SURGICAL SUPPLY — 47 items
APL SKNCLS STERI-STRIP NONHPOA (GAUZE/BANDAGES/DRESSINGS) ×1
BENZOIN TINCTURE PRP APPL 2/3 (GAUZE/BANDAGES/DRESSINGS) ×3 IMPLANT
BLADE HEX COATED 2.75 (ELECTRODE) ×3 IMPLANT
BLADE SURG 15 STRL LF DISP TIS (BLADE) ×1 IMPLANT
BLADE SURG 15 STRL SS (BLADE) ×3
CANISTER SUCT 1200ML W/VALVE (MISCELLANEOUS) ×3 IMPLANT
CHLORAPREP W/TINT 26ML (MISCELLANEOUS) ×3 IMPLANT
CLOSURE WOUND 1/2 X4 (GAUZE/BANDAGES/DRESSINGS) ×1
COVER BACK TABLE 60X90IN (DRAPES) ×3 IMPLANT
COVER MAYO STAND STRL (DRAPES) ×3 IMPLANT
DECANTER SPIKE VIAL GLASS SM (MISCELLANEOUS) IMPLANT
DEVICE DUBIN W/COMP PLATE 8390 (MISCELLANEOUS) IMPLANT
DRAPE LAPAROTOMY 100X72 PEDS (DRAPES) ×3 IMPLANT
DRAPE UTILITY XL STRL (DRAPES) ×3 IMPLANT
DRSG TEGADERM 4X4.75 (GAUZE/BANDAGES/DRESSINGS) ×3 IMPLANT
ELECT REM PT RETURN 9FT ADLT (ELECTROSURGICAL) ×3
ELECTRODE REM PT RTRN 9FT ADLT (ELECTROSURGICAL) ×1 IMPLANT
GLOVE BIO SURGEON STRL SZ 6.5 (GLOVE) ×1 IMPLANT
GLOVE BIO SURGEON STRL SZ7 (GLOVE) ×3 IMPLANT
GLOVE BIO SURGEONS STRL SZ 6.5 (GLOVE) ×1
GLOVE BIOGEL PI IND STRL 7.5 (GLOVE) ×1 IMPLANT
GLOVE BIOGEL PI INDICATOR 7.5 (GLOVE) ×2
GOWN STRL REUS W/ TWL LRG LVL3 (GOWN DISPOSABLE) ×2 IMPLANT
GOWN STRL REUS W/TWL LRG LVL3 (GOWN DISPOSABLE) ×6
KIT MARKER MARGIN INK (KITS) IMPLANT
NDL HYPO 25X1 1.5 SAFETY (NEEDLE) ×1 IMPLANT
NEEDLE HYPO 25X1 1.5 SAFETY (NEEDLE) ×3 IMPLANT
NS IRRIG 1000ML POUR BTL (IV SOLUTION) ×3 IMPLANT
PACK BASIN DAY SURGERY FS (CUSTOM PROCEDURE TRAY) ×3 IMPLANT
PENCIL BUTTON HOLSTER BLD 10FT (ELECTRODE) ×3 IMPLANT
SLEEVE SCD COMPRESS KNEE MED (MISCELLANEOUS) ×3 IMPLANT
SPONGE GAUZE 2X2 8PLY STER LF (GAUZE/BANDAGES/DRESSINGS) ×1
SPONGE GAUZE 2X2 8PLY STRL LF (GAUZE/BANDAGES/DRESSINGS) ×2 IMPLANT
SPONGE GAUZE 4X4 12PLY STER LF (GAUZE/BANDAGES/DRESSINGS) IMPLANT
SPONGE LAP 4X18 X RAY DECT (DISPOSABLE) ×3 IMPLANT
STRIP CLOSURE SKIN 1/2X4 (GAUZE/BANDAGES/DRESSINGS) ×2 IMPLANT
SUT CHROMIC 3 0 SH 27 (SUTURE) IMPLANT
SUT MON AB 4-0 PC3 18 (SUTURE) ×3 IMPLANT
SUT SILK 2 0 SH (SUTURE) IMPLANT
SUT VIC AB 3-0 SH 27 (SUTURE) ×3
SUT VIC AB 3-0 SH 27X BRD (SUTURE) ×1 IMPLANT
SYR CONTROL 10ML LL (SYRINGE) ×3 IMPLANT
TOWEL OR 17X24 6PK STRL BLUE (TOWEL DISPOSABLE) ×3 IMPLANT
TOWEL OR NON WOVEN STRL DISP B (DISPOSABLE) ×3 IMPLANT
TUBE CONNECTING 20'X1/4 (TUBING) ×1
TUBE CONNECTING 20X1/4 (TUBING) ×2 IMPLANT
YANKAUER SUCT BULB TIP NO VENT (SUCTIONS) ×3 IMPLANT

## 2015-09-07 NOTE — Op Note (Signed)
Preop diagnosis: Invasive ductal carcinoma left breast status post lumpectomy and sentinel lymph node biopsy Postop diagnosis: Same Procedure performed: Reexcision of superior and medial margins left lumpectomy site Surgeon:Yetta Marceaux K. Anesthesia: Gen. via LMA Indications: This is a 74 year old female who underwent left lumpectomy and sentinel lymph node biopsy on 08/17/15. The sentinel lymph node was biopsied. The patient had a positive medial margin and a focally close margin superiorly less than 1 mm. She is not a good candidate for systemic chemotherapy. She is considering radiation therapy. She presents now for reexcision of the superior medial margins.  Description of procedure: The patient is brought to the operating room and placed in a supine position on the operating room table. After an adequate level of general anesthesia was obtained her left breast was prepped with ChloraPrep and draped in sterile fashion. A timeout was taken to ensure the proper patient and proper procedure. We infiltrated the area around her previous incision with 0.25% Marcaine with epinephrine. We open her previous incision. Dissection was carried down through the subcutaneous tissues with cautery. We entered the seroma cavity and evacuated this seroma. Retractors were placed provide exposure of the lumpectomy cavity. We grasped the superior margin with an Allis clamp. I excised an additional 1 cm of the superior margin. This was oriented with a paint kit. The surface of the lumpectomy cavity is now labeled the inferior side of the superior margin.  We then turned our attention to the medial margin. We excised another centimeter of the medial margin. The specimen was oriented with a paint kit. The surface of the lumpectomy cavity is now labeled the lateral side of the medial margin. Both specimens were sent for pathologic examination. The wound was explored and inspected for hemostasis. We irrigated thoroughly. The  wound was closed with 3-0 Vicryl 4 Monocryl. Steri-Strips and clean dressings were applied. Patient was then next patient brought to the recovery room in stable condition. All sponge, initially, and needle counts are correct.  [name] Jane Dover, MD, [facility] Surgery  General/ Trauma Surgery  09/07/2015 12:33 PM

## 2015-09-07 NOTE — Anesthesia Procedure Notes (Signed)
Procedure Name: LMA Insertion Date/Time: 09/07/2015 11:52 AM Performed by: Lyndee Leo Pre-anesthesia Checklist: Patient identified, Emergency Drugs available, Suction available and Patient being monitored Patient Re-evaluated:Patient Re-evaluated prior to inductionOxygen Delivery Method: Circle System Utilized Preoxygenation: Pre-oxygenation with 100% oxygen Intubation Type: IV induction Ventilation: Mask ventilation without difficulty LMA: LMA inserted LMA Size: 4.0 Number of attempts: 1 Airway Equipment and Method: Bite block Placement Confirmation: positive ETCO2 Tube secured with: Tape Dental Injury: Teeth and Oropharynx as per pre-operative assessment

## 2015-09-07 NOTE — Transfer of Care (Signed)
Immediate Anesthesia Transfer of Care Note  Patient: [name]  Procedure(s) Performed: Procedure(s): RE-EXCISION OF LEFT BREAST LUMPECTOMY (Left)  Patient Location: PACU  Anesthesia Type:General  Level of Consciousness: awake, sedated and patient cooperative  Airway & Oxygen Therapy: Patient Spontanous Breathing and Patient connected to face mask oxygen  Post-op Assessment: Report given to RN and Post -op Vital signs reviewed and stable  Post vital signs: Reviewed and stable  Last Vitals:  Filed Vitals:   09/07/15 1035  BP: 153/62  Pulse: 91  Temp: 36.8 C  Resp: 20    Last Pain: There were no vitals filed for this visit.       Complications: No apparent anesthesia complications

## 2015-09-07 NOTE — Discharge Instructions (Signed)
[facility] °Office Phone Number [phone] ° °BREAST BIOPSY/ PARTIAL MASTECTOMY: POST OP INSTRUCTIONS ° °Always review your discharge instruction sheet given to you by the facility where your surgery was performed. ° °IF YOU HAVE DISABILITY OR FAMILY LEAVE FORMS, YOU MUST BRING THEM TO THE OFFICE FOR PROCESSING.  DO NOT GIVE THEM TO YOUR DOCTOR. ° °1. A prescription for pain medication may be given to you upon discharge.  Take your pain medication as prescribed, if needed.  If narcotic pain medicine is not needed, then you may take acetaminophen (Tylenol) or ibuprofen (Advil) as needed. °2. Take your usually prescribed medications unless otherwise directed °3. If you need a refill on your pain medication, please contact your pharmacy.  They will contact our office to request authorization.  Prescriptions will not be filled after 5pm or on week-ends. °4. You should eat very light the first 24 hours after surgery, such as soup, crackers, pudding, etc.  Resume your normal diet the day after surgery. °5. Most patients will experience some swelling and bruising in the breast.  Ice packs and a good support bra will help.  Swelling and bruising can take several days to resolve.  °6. It is common to experience some constipation if taking pain medication after surgery.  Increasing fluid intake and taking a stool softener will usually help or prevent this problem from occurring.  A mild laxative (Milk of Magnesia or Miralax) should be taken according to package directions if there are no bowel movements after 48 hours. °7. Unless discharge instructions indicate otherwise, you may remove your bandages 24-48 hours after surgery, and you may shower at that time.  You may have steri-strips (small skin tapes) in place directly over the incision.  These strips should be left on the skin for 7-10 days.  If your surgeon used skin glue on the incision, you may shower in 24 hours.  The glue will flake off over the  next 2-3 weeks.  Any sutures or staples will be removed at the office during your follow-up visit. °8. ACTIVITIES:  You may resume regular daily activities (gradually increasing) beginning the next day.  Wearing a good support bra or sports bra minimizes pain and swelling.  You may have sexual intercourse when it is comfortable. °a. You may drive when you no longer are taking prescription pain medication, you can comfortably wear a seatbelt, and you can safely maneuver your car and apply brakes. °b. RETURN TO WORK:  ______________________________________________________________________________________ °9. You should see your doctor in the office for a follow-up appointment approximately two weeks after your surgery.  Your doctor’s nurse will typically make your follow-up appointment when she calls you with your pathology report.  Expect your pathology report 2-3 business days after your surgery.  You may call to check if you do not hear from us after three days. °10. OTHER INSTRUCTIONS: _______________________________________________________________________________________________ _____________________________________________________________________________________________________________________________________ °_____________________________________________________________________________________________________________________________________ °_____________________________________________________________________________________________________________________________________ ° °WHEN TO CALL YOUR DOCTOR: °1. Fever over 101.0 °2. Nausea and/or vomiting. °3. Extreme swelling or bruising. °4. Continued bleeding from incision. °5. Increased pain, redness, or drainage from the incision. ° °The clinic staff is available to answer your questions during regular business hours.  Please don’t hesitate to call and ask to speak to one of the nurses for clinical concerns.  If you have a medical emergency, go to the nearest  emergency room or call 911.  A surgeon from [facility] is always on call at the hospital. ° °For further questions, please visit [email or web address]  ° ° ° °  Post Anesthesia Home Care Instructions ° °Activity: °Get plenty of rest for the remainder of the day. A responsible adult should stay with you for 24 hours following the procedure.  °For the next 24 hours, DO NOT: °-Drive a car °-Operate machinery °-Drink alcoholic beverages °-Take any medication unless instructed by your physician °-Make any legal decisions or sign important papers. ° °Meals: °Start with liquid foods such as gelatin or soup. Progress to regular foods as tolerated. Avoid greasy, spicy, heavy foods. If nausea and/or vomiting occur, drink only clear liquids until the nausea and/or vomiting subsides. Call your physician if vomiting continues. ° °Special Instructions/Symptoms: °Your throat may feel dry or sore from the anesthesia or the breathing tube placed in your throat during surgery. If this causes discomfort, gargle with warm salt water. The discomfort should disappear within 24 hours. ° °If you had a scopolamine patch placed behind your ear for the management of post- operative nausea and/or vomiting: ° °1. The medication in the patch is effective for 72 hours, after which it should be removed.  Wrap patch in a tissue and discard in the trash. Wash hands thoroughly with soap and water. °2. You may remove the patch earlier than 72 hours if you experience unpleasant side effects which may include dry mouth, dizziness or visual disturbances. °3. Avoid touching the patch. Wash your hands with soap and water after contact with the patch. °  ° °

## 2015-09-07 NOTE — Anesthesia Postprocedure Evaluation (Signed)
Anesthesia Post Note  Patient: [name]  Procedure(s) Performed: Procedure(s) (LRB): RE-EXCISION OF LEFT BREAST LUMPECTOMY (Left)  Patient location during evaluation: PACU Anesthesia Type: General Level of consciousness: awake and alert Pain management: pain level controlled Vital Signs Assessment: post-procedure vital signs reviewed and stable Respiratory status: spontaneous breathing, nonlabored ventilation and respiratory function stable Cardiovascular status: blood pressure returned to baseline and stable Postop Assessment: no signs of nausea or vomiting Anesthetic complications: no    Last Vitals:  Filed Vitals:   09/07/15 1315 09/07/15 1412  BP: 177/77 165/85  Pulse: 103 103  Temp:  36.4 C  Resp: 23 20    Last Pain:  Filed Vitals:   09/07/15 1423  PainSc: 5                  Nazir Hacker A

## 2015-09-07 NOTE — H&P (Signed)
The patient is a 75 year old female who presents with breast cancer.   Referring - Cornella PCP - Stringer/ Brigitte Pulse  Reason - new left breast cancer  This is a 75 year old female who was recently undergoing a routine physical examination by Dr. Raphael Gibney. He noticed a left breast mass. She was referred to the [facility] where she underwent a diagnostic mammogram and left breast ultrasound on 08/08/15. The left breast shows an irregular spiculated mass in the upper outer quadrant measuring about 2 cm. The axilla was negative. Biopsy was recommended. For various reasons the patient's daughter wanted a second opinion and took her to [facility] on the same day. 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 She underwent lumpectomy but the medial margin was positive and the superior margin is within 1 mm.  She presents now for reexcision of these margins.  Menarche - 47 First pregnancy - 17 Breastfeed - no Hormones - no Family history - no   CLINICAL DATA: Screening recall for bilateral breast masses.  EXAM: 2D DIGITAL DIAGNOSTIC BILATERAL MAMMOGRAM WITH CAD AND ADJUNCT TOMO  LEFT BREAST ULTRASOUND  COMPARISON: Previous exam(s).  ACR Breast Density Category c: The breast tissue is heterogeneously dense, which may obscure small masses.  FINDINGS: Spot compression CC and MLO tomograms were performed of the bilateral breasts. The initially questioned possible right breast masses resolves on the additional imaging with findings compatible with superimposed fibroglandular tissue. An oval mass containing coarse calcifications present in the far upper outer posterior right breast appears unchanged dating back to prior mammograms from 03/24/2009.  There is an irregular spiculated mass in the upper-outer left breast measuring approximately 2 cm. Additional questioned possible left breast masses do not definitely persist on the additional  imaging.  Mammographic images were processed with CAD.  Physical examination of the outer left breast reveals a firm mass at the approximate 3 o'clock position.  Targeted ultrasound of the left breast was performed demonstrating an irregular hypoechoic mass at 3 o'clock 4 cm from nipple measuring 1.4 x 1.3 x 2.2 cm. This corresponds with the mass seen at mammography. No definite lymphadenopathy seen in the left axilla. The outer and central left breast was scanned with no suspicious abnormality seen.  IMPRESSION: Highly suspicious left breast mass.  RECOMMENDATION: Ultrasound-guided biopsy of the mass in the outer left breast is recommended. This is scheduled for Friday April 21st at 9 a.m. The patient will be accompanied by her daughter. She will stop her aspirin a few days prior to the procedure.  I have discussed the findings and recommendations with the patient. Results were also provided in writing at the conclusion of the visit. If applicable, a reminder letter will be sent to the patient regarding the next appointment.  BI-RADS CATEGORY 5: Highly suggestive of malignancy.   Electronically Signed By: Everlean Alstrom M.D. On: 08/08/2015 08:31   Other Problems  Arthritis Back Pain Congestive Heart Failure Depression Diabetes Mellitus Gastroesophageal Reflux Disease High blood pressure Hypercholesterolemia  Past Surgical History  Breast Biopsy Left. Hysterectomy (not due to cancer) - Partial Knee Surgery Bilateral.  Diagnostic Studies History  Colonoscopy never Mammogram within last year Pap Smear 1-5 years ago  Allergies  Codeine Sulfate *ANALGESICS - OPIOID* Glimepiride *ANTIDIABETICS* GlyBURIDE *ANTIDIABETICS* TraMADol HCl *CHEMICALS* SHELLFISH  Medication History Elavil (25MG  Tablet, Oral) Active. Norvasc (10MG  Tablet, Oral) Active. Aspirin (81MG  Tablet, Oral) Active. Cogentin (2MG  Tablet, Oral) Active. Clozaril  (100MG  Tablet, Oral) Active. Aricept (10MG  Tablet, Oral) Active. Flonase (  50MCG/ACT Suspension, Nasal) Active. Haldol (10MG  Tablet, Oral) Active. NovoLOG Mix 70/30 FlexPen ((70-30) 100UNIT/ML Susp Pen-inj, Subcutaneous) Active. Lantus SoloStar (100UNIT/ML Soln Pen-inj, Subcutaneous) Active. Xyzal (5MG  Tablet, Oral) Active. Namenda (10MG  Tablet, Oral) Active. Multiple Vitamin (Oral) Active. NexIUM (40MG  Capsule DR, Oral) Active. Actos (30MG  Tablet, Oral) Active. Klor-Con (8MEQ Tablet ER, Oral) Active. Altace (10MG  Capsule, Oral) Active. Crestor (20MG  Tablet, Oral) Active. Medications Reconciled  Social History  Caffeine use Carbonated beverages. No drug use  Family History  Arthritis Daughter, Mother. Depression Daughter. Diabetes Mellitus Daughter. Heart Disease Daughter.  Pregnancy / Birth History Age at menarche 45 years. Age of menopause 40-55 Gravida 5 Irregular periods Maternal age 71-20 Para 5     Review of Systems  General Present- Night Sweats. Not Present- Appetite Loss, Chills, Fatigue, Fever, Weight Gain and Weight Loss. Skin Present- Dryness. Not Present- Change in Wart/Mole, Hives, Jaundice, New Lesions, Non-Healing Wounds, Rash and Ulcer. HEENT Present- Hearing Loss, Ringing in the Ears, Seasonal Allergies, Sinus Pain, Visual Disturbances, Wears glasses/contact lenses and Yellow Eyes. Not Present- Earache, Hoarseness, Nose Bleed, Oral Ulcers and Sore Throat. Breast Present- Breast Mass. Not Present- Breast Pain, Nipple Discharge and Skin Changes. Cardiovascular Present- Difficulty Breathing Lying Down, Shortness of Breath and Swelling of Extremities. Not Present- Chest Pain, Leg Cramps, Palpitations and Rapid Heart Rate. Gastrointestinal Present- Abdominal Pain and Constipation. Not Present- Bloating, Bloody Stool, Change in Bowel Habits, Chronic diarrhea, Difficulty Swallowing, Excessive gas, Gets full quickly at meals, Hemorrhoids,  Indigestion, Nausea, Rectal Pain and Vomiting. Female Genitourinary Present- Nocturia. Not Present- Frequency, Painful Urination, Pelvic Pain and Urgency. Neurological Present- Decreased Memory, Trouble walking and Weakness. Not Present- Fainting, Headaches, Numbness, Seizures, Tingling and Tremor. Psychiatric Present- Anxiety, Change in Sleep Pattern and Depression. Not Present- Bipolar, Fearful and Frequent crying. Endocrine Present- Hot flashes. Not Present- Cold Intolerance, Excessive Hunger, Hair Changes, Heat Intolerance and New Diabetes.  Vitals Weight: 158.6 lb Height: 63in Body Surface Area: 1.75 m Body Mass Index: 28.09 kg/m  Temp.: 97.57F  Pulse: 107 (Regular)  BP: 150/82 (Sitting, Left Arm, Standard)      Physical Exam   The physical exam findings are as follows: Note:WDWN in NAD HEENT: EOMI, sclera anicteric Neck: No masses, no thyromegaly Lungs: CTA bilaterally; normal respiratory effort Breasts: Symmetric with no nipple retraction or drainage bilaterally The right breast shows no palpable masses or axillary lymphadenopathy The left breast shows a firm palpable mass in the upper outer quadrant between 2:00 and 3:00. Axillary lymphadenopathy is absent. CV: Regular rate and rhythm; no murmurs Abd: +bowel sounds, soft, non-tender, no masses Ext: Well-perfused; no edema Skin: Warm, dry; no sign of jaundice    Assessment & Plan   BREAST CANCER, LEFT (C50.912)  Current Plans  Schedule for Surgery - Reexcision of left lumpectomy - superior and medial margins. The surgical procedure has been discussed with the patient. Potential risks, benefits, alternative treatments, and expected outcomes have been explained. All of the patient's questions at this time have been answered. The likelihood of reaching the patient's treatment goal is good. The patient understand the proposed surgical procedure and wishes to proceed.  Imogene Burn. Georgette Dover, MD, [facility]  General/ Trauma Surgery  09/07/2015 11:20 AM

## 2015-09-07 NOTE — Anesthesia Preprocedure Evaluation (Signed)
Anesthesia Evaluation  Patient identified by MRN, date of birth, ID band Patient awake    Reviewed: Allergy & Precautions, NPO status , Patient's Chart, lab work & pertinent test results  Airway Mallampati: I  TM Distance: >3 FB Neck ROM: Full    Dental  (+) Dental Advisory Given, Partial Lower, Teeth Intact   Pulmonary former smoker,    breath sounds clear to auscultation       Cardiovascular hypertension, Pt. on medications + CAD and + Peripheral Vascular Disease   Rhythm:Regular Rate:Normal     Neuro/Psych    GI/Hepatic   Endo/Other  diabetes, Well Controlled, Type 2, Oral Hypoglycemic Agents, Insulin DependentMorbid obesity  Renal/GU      Musculoskeletal   Abdominal   Peds  Hematology   Anesthesia Other Findings   Reproductive/Obstetrics                             Anesthesia Physical Anesthesia Plan  ASA: III  Anesthesia Plan: General   Post-op Pain Management:    Induction: Intravenous  Airway Management Planned: LMA  Additional Equipment:   Intra-op Plan:   Post-operative Plan: Extubation in OR  Informed Consent: I have reviewed the patients History and Physical, chart, labs and discussed the procedure including the risks, benefits and alternatives for the proposed anesthesia with the patient or authorized representative who has indicated his/her understanding and acceptance.   Dental advisory given  Plan Discussed with: CRNA, Anesthesiologist and Surgeon  Anesthesia Plan Comments:         Anesthesia Quick Evaluation

## 2015-09-08 ENCOUNTER — Encounter (HOSPITAL_BASED_OUTPATIENT_CLINIC_OR_DEPARTMENT_OTHER): Payer: Self-pay | Admitting: Surgery

## 2015-09-12 DIAGNOSIS — Z79899 Other long term (current) drug therapy: Secondary | ICD-10-CM | POA: Diagnosis not present

## 2015-09-12 DIAGNOSIS — F200 Paranoid schizophrenia: Secondary | ICD-10-CM | POA: Diagnosis not present

## 2015-09-21 ENCOUNTER — Ambulatory Visit: Payer: Medicare Other | Admitting: Radiation Oncology

## 2015-09-28 NOTE — Progress Notes (Signed)
Location of Breast Cancer:Upper-outer quadrant of left female breast  Histology per Pathology Report: Diagnosis 09-07-15 1. Breast, excision, Left - FIBROCYSTIC CHANGE. - NO MALIGNANCY IDENTIFIED. 2. Breast, excision, Left Superior Margin - FIBROCYSTIC CHANGE. - RESECTION SITE CHANGES. - NO MALIGNANCY.  Diagnosis 08-17-15 1. Breast, lumpectomy, Left - INVASIVE GRADE II DUCTAL CARCINOMA, SPANNING 2.3 CM IN GREATEST DIMENSION. - INVASIVE DUCTAL CARCINOMA COMES EXTREMELY CLOSE (LESS THAN 0.1 CM) TO THE SUPERIOR MARGIN BUT THE INKED MARGINAL SURFACE IS NEGATIVE. - INVASIVE DUCTAL CARCINOMA INVOLVES THE INFERIOR AND MEDIAL MARGINS ON THE INITIAL LUMPECTOMY SPECIMEN. - OTHER MARGINS ARE NEGATIVE. - SEE ONCOLOGY TEMPLATE. 2. Breast, excision, Left additional inferior margin - BENIGN BREAST PARENCHYMA WITH FIBROCYSTIC CHANGES AND CALCIFICATIONS. - NO ATYPIA, HYPERPLASIA, OR MALIGNANCY IDENTIFIED. 3. Lymph node, sentinel, biopsy, Left Axilla - ONE BENIGN LYMPH NODE WITH NO TUMOR SEEN (0/1).  Receptor Status: ER(30 % +), PR (0 % -), Her2-neu (neg), Ki-(15 %)  Mrs. Teryl Lucy had a breast exam by her OB GYN physician who felt a lump and immediately referred her for a mammogram.   Past/Anticipated interventions by surgeon, if NTI:[id number] referral  Past/Anticipated interventions by medical oncology, if QQP:[id number]  Lymphedema issues, if any:  No  Pain issues, if any:  No  SAFETY ISSUES:  Prior radiation? : No  Pacemaker/ICD?: No  Possible current pregnancy?:No  Is the patient on methotrexate? : No  Current Complaints / other details:  Here for radiation discussion  Menarche 12,G5,P5,BC No,Menopause [age],HRT No BP 138/59 mmHg  Pulse 103  Temp(Src) 97.8 F (36.6 C)  Resp 16  Ht 5" (0.127 m)  Wt 156 lb 11.2 oz (71.079 kg)  BMI 4406.91 kg/m2  SpO2 100% Georgena Spurling, RN 09/28/2015,4:38 PM

## 2015-10-04 ENCOUNTER — Ambulatory Visit
Admission: RE | Admit: 2015-10-04 | Discharge: 2015-10-04 | Disposition: A | Discharge status: Home / Self Care | Payer: Medicare Other | Source: Ambulatory Visit | Attending: Radiation Oncology | Admitting: Radiation Oncology

## 2015-10-04 ENCOUNTER — Encounter: Payer: Self-pay | Admitting: Radiation Oncology

## 2015-10-04 VITALS — BP 138/59 | HR 103 | Temp 97.8°F | Resp 16 | Ht <= 58 in | Wt 156.7 lb

## 2015-10-04 DIAGNOSIS — C50412 Malignant neoplasm of upper-outer quadrant of left female breast: Secondary | ICD-10-CM | POA: Diagnosis present

## 2015-10-04 DIAGNOSIS — Z170 Estrogen receptor positive status [ER+]: Secondary | ICD-10-CM | POA: Diagnosis not present

## 2015-10-04 NOTE — Progress Notes (Signed)
  Radiation Oncology         [phone]) [phone] ________________________________  Initial Outpatient Consultation - Date: 10/04/2015   Name: [name] MRN: [id number]   DOB: 1940-09-20  REFERRING PHYSICIAN: Donnie Mesa, MD  DIAGNOSIS AND STAGE: Breast cancer of the upper-outer quadrant of the left female breast.  HISTORY OF PRESENT ILLNESS::[name] is a 75 y.o. female who presents today because of a palpable mass. She had a diagnostic mammogram and ultrasound 08/08/2015, revealing an irregular mass in the upper-outer left breast measuring approximately 2 cm. Ultrasound showed an irregular hypoechoic mass at 3 o'clock position, 4 CFN measuring 1.4 x 1.3 x 2.2 cm. There was no definite lymphadenopathy seen in the left axilla. She had a biopsy 08/09/2015, revealing invasive mammary carcinoma. She had a lumpectomy 08/17/2015, revealing invasive grade II ductal carcinoma, ER (30%), PR (0%), Her2-neu negative. She had her re-excision 09/07/2015, which was negative. She is not felt to be a candidate for anti-estrogen treatment.   She has an Environmental consultant that comes over daily to help her with meals and cleaning.   PREVIOUS RADIATION THERAPY: No  Past medical, social and family history were reviewed in the electronic chart. Review of symptoms was reviewed in the electronic chart. Medications were reviewed in the electronic chart.   PHYSICAL EXAM:  Filed Vitals:   10/04/15 1509  BP: 138/59  Pulse: 103  Temp: 97.8 F (36.6 C)  Resp: 16  .156 lb 11.2 oz (71.079 kg). Incision is healing well in the lateral aspect of her breast.  IMPRESSION: [name] is a 74 yo woman with invasive mammary carcinoma of the left female breast. She will not receive chemotherapy.   PLAN: I spoke to the patient today regarding her diagnosis and options for treatment. We discussed the equivalence in terms of survival and local failure between mastectomy and breast conservation. We discussed the role of radiation in  decreasing local failures in patients who undergo lumpectomy. We discussed 16 treatments as an outpatient. We discussed the possible side effects including but not limited to skin redness, fatigue, permanent skin darkening, and breast swelling.   Her daughter is concerned the coming to treatment every day for three weeks would be too tiring for her. We discussed that she can have annual mammograms. Her daughter would like to continue with mammograms and self-breast exams instead of pursuingradiation treatment at this time. We discussed the possibility of tumor recurrence without treatment and the time course of the development of metastatic disease.   We discussed that her primary care physician, Dr. Raphael Gibney, and Dr. Georgette Dover should alternate appointments so she is able to see one of them every 6 months. She will continue to have an annual mammogram. She has a follow up with Dr.Tsuei in 3 months. I will follow up with her on an as needed basis.    I spent 40 minutes  face to face with the patient and more than 50% of that time was spent in counseling and/or coordination of care.   ------------------------------------------------  Thea Silversmith, MD    This document serves as a record of services personally performed by Thea Silversmith, MD. It was created on her behalf by  Lendon Collar, a trained medical scribe. The creation of this record is based on the scribe's personal observations and the provider's statements to them. This document has been checked and approved by the attending provider.

## 2015-10-05 ENCOUNTER — Other Ambulatory Visit: Payer: Self-pay | Admitting: *Deleted

## 2015-10-05 ENCOUNTER — Telehealth: Payer: Self-pay | Admitting: Hematology and Oncology

## 2015-10-05 NOTE — Telephone Encounter (Signed)
lvm to inform pt daughter of 6/21 appt at 1145 am per 6/15 pof

## 2015-10-06 ENCOUNTER — Telehealth: Payer: Self-pay | Admitting: Hematology and Oncology

## 2015-10-06 NOTE — Addendum Note (Signed)
Encounter addended by: Malena Edman, RN on: 10/06/2015 10:17 AM<BR>     Documentation filed: Charges VN

## 2015-10-06 NOTE — Telephone Encounter (Signed)
Cancelled 6/21 appt  per pt daughter 6/16. they did not want to r/s appt

## 2015-10-09 ENCOUNTER — Other Ambulatory Visit: Payer: Self-pay | Admitting: *Deleted

## 2015-10-09 DIAGNOSIS — C50412 Malignant neoplasm of upper-outer quadrant of left female breast: Secondary | ICD-10-CM

## 2015-10-11 ENCOUNTER — Ambulatory Visit: Payer: Medicare Other | Admitting: Hematology and Oncology

## 2015-10-13 ENCOUNTER — Encounter: Payer: Self-pay | Admitting: Nurse Practitioner

## 2015-10-13 DIAGNOSIS — C50412 Malignant neoplasm of upper-outer quadrant of left female breast: Secondary | ICD-10-CM

## 2015-10-13 DIAGNOSIS — Z79899 Other long term (current) drug therapy: Secondary | ICD-10-CM | POA: Diagnosis not present

## 2015-10-13 DIAGNOSIS — F200 Paranoid schizophrenia: Secondary | ICD-10-CM | POA: Diagnosis not present

## 2015-10-13 NOTE — Progress Notes (Signed)
The Survivorship Care Plan was mailed to [name] as she reported not being able to come in to the Survivorship Clinic for an in-person visit at this time. A letter was mailed to her outlining the purpose of the content of the care plan, as well as encouraging her to reach out to me with any questions or concerns.  My business card was included in the correspondence to the patient as well.  A copy of the care plan was also routed/faxed/mailed to Jane Amel, MD, the patient's PCP.  I will not be placing any follow-up appointments to the Survivorship Clinic for [name], but I am happy to see her at any time in the future for any survivorship concerns that may arise. Thank you for allowing me to participate in her care!  Kenn File, [place] [phone]

## 2015-10-20 DIAGNOSIS — I959 Hypotension, unspecified: Secondary | ICD-10-CM | POA: Diagnosis not present

## 2015-10-20 DIAGNOSIS — E1165 Type 2 diabetes mellitus with hyperglycemia: Secondary | ICD-10-CM | POA: Diagnosis not present

## 2015-10-20 DIAGNOSIS — R300 Dysuria: Secondary | ICD-10-CM | POA: Diagnosis not present

## 2015-10-20 DIAGNOSIS — R079 Chest pain, unspecified: Secondary | ICD-10-CM | POA: Diagnosis not present

## 2015-10-20 DIAGNOSIS — D649 Anemia, unspecified: Secondary | ICD-10-CM | POA: Diagnosis not present

## 2015-10-20 DIAGNOSIS — N3000 Acute cystitis without hematuria: Secondary | ICD-10-CM | POA: Diagnosis not present

## 2015-11-01 ENCOUNTER — Encounter (HOSPITAL_COMMUNITY): Payer: Self-pay

## 2015-11-01 ENCOUNTER — Emergency Department (HOSPITAL_COMMUNITY): Payer: Medicare Other

## 2015-11-01 ENCOUNTER — Inpatient Hospital Stay (HOSPITAL_COMMUNITY): Payer: Medicare Other

## 2015-11-01 ENCOUNTER — Inpatient Hospital Stay (HOSPITAL_COMMUNITY)
Admission: EM | Admit: 2015-11-01 | Discharge: 2015-11-09 | DRG: 871 | Disposition: A | Discharge status: Home Health | Payer: Medicare Other | Attending: Family Medicine | Admitting: Family Medicine

## 2015-11-01 DIAGNOSIS — E119 Type 2 diabetes mellitus without complications: Secondary | ICD-10-CM

## 2015-11-01 DIAGNOSIS — K279 Peptic ulcer, site unspecified, unspecified as acute or chronic, without hemorrhage or perforation: Secondary | ICD-10-CM | POA: Diagnosis not present

## 2015-11-01 DIAGNOSIS — Z833 Family history of diabetes mellitus: Secondary | ICD-10-CM

## 2015-11-01 DIAGNOSIS — J181 Lobar pneumonia, unspecified organism: Secondary | ICD-10-CM | POA: Diagnosis not present

## 2015-11-01 DIAGNOSIS — Z8249 Family history of ischemic heart disease and other diseases of the circulatory system: Secondary | ICD-10-CM

## 2015-11-01 DIAGNOSIS — K922 Gastrointestinal hemorrhage, unspecified: Secondary | ICD-10-CM | POA: Diagnosis not present

## 2015-11-01 DIAGNOSIS — E872 Acidosis: Secondary | ICD-10-CM | POA: Diagnosis present

## 2015-11-01 DIAGNOSIS — D509 Iron deficiency anemia, unspecified: Secondary | ICD-10-CM | POA: Diagnosis present

## 2015-11-01 DIAGNOSIS — F0390 Unspecified dementia without behavioral disturbance: Secondary | ICD-10-CM | POA: Diagnosis present

## 2015-11-01 DIAGNOSIS — E860 Dehydration: Secondary | ICD-10-CM | POA: Diagnosis present

## 2015-11-01 DIAGNOSIS — E785 Hyperlipidemia, unspecified: Secondary | ICD-10-CM | POA: Diagnosis present

## 2015-11-01 DIAGNOSIS — J690 Pneumonitis due to inhalation of food and vomit: Secondary | ICD-10-CM | POA: Diagnosis not present

## 2015-11-01 DIAGNOSIS — Z6830 Body mass index (BMI) 30.0-30.9, adult: Secondary | ICD-10-CM

## 2015-11-01 DIAGNOSIS — R0602 Shortness of breath: Secondary | ICD-10-CM

## 2015-11-01 DIAGNOSIS — K2211 Ulcer of esophagus with bleeding: Secondary | ICD-10-CM | POA: Diagnosis not present

## 2015-11-01 DIAGNOSIS — F200 Paranoid schizophrenia: Secondary | ICD-10-CM | POA: Diagnosis present

## 2015-11-01 DIAGNOSIS — M549 Dorsalgia, unspecified: Secondary | ICD-10-CM | POA: Diagnosis present

## 2015-11-01 DIAGNOSIS — K210 Gastro-esophageal reflux disease with esophagitis: Secondary | ICD-10-CM | POA: Diagnosis present

## 2015-11-01 DIAGNOSIS — B3781 Candidal esophagitis: Secondary | ICD-10-CM | POA: Diagnosis not present

## 2015-11-01 DIAGNOSIS — R6521 Severe sepsis with septic shock: Secondary | ICD-10-CM | POA: Diagnosis present

## 2015-11-01 DIAGNOSIS — J189 Pneumonia, unspecified organism: Secondary | ICD-10-CM | POA: Diagnosis not present

## 2015-11-01 DIAGNOSIS — I739 Peripheral vascular disease, unspecified: Secondary | ICD-10-CM | POA: Diagnosis present

## 2015-11-01 DIAGNOSIS — F419 Anxiety disorder, unspecified: Secondary | ICD-10-CM | POA: Diagnosis present

## 2015-11-01 DIAGNOSIS — R410 Disorientation, unspecified: Secondary | ICD-10-CM | POA: Diagnosis not present

## 2015-11-01 DIAGNOSIS — E876 Hypokalemia: Secondary | ICD-10-CM | POA: Diagnosis present

## 2015-11-01 DIAGNOSIS — I2510 Atherosclerotic heart disease of native coronary artery without angina pectoris: Secondary | ICD-10-CM | POA: Diagnosis present

## 2015-11-01 DIAGNOSIS — Z853 Personal history of malignant neoplasm of breast: Secondary | ICD-10-CM

## 2015-11-01 DIAGNOSIS — F17200 Nicotine dependence, unspecified, uncomplicated: Secondary | ICD-10-CM | POA: Diagnosis present

## 2015-11-01 DIAGNOSIS — R0600 Dyspnea, unspecified: Secondary | ICD-10-CM | POA: Diagnosis not present

## 2015-11-01 DIAGNOSIS — J9601 Acute respiratory failure with hypoxia: Secondary | ICD-10-CM | POA: Diagnosis present

## 2015-11-01 DIAGNOSIS — Z881 Allergy status to other antibiotic agents status: Secondary | ICD-10-CM

## 2015-11-01 DIAGNOSIS — K224 Dyskinesia of esophagus: Secondary | ICD-10-CM | POA: Diagnosis present

## 2015-11-01 DIAGNOSIS — K580 Irritable bowel syndrome with diarrhea: Secondary | ICD-10-CM | POA: Diagnosis present

## 2015-11-01 DIAGNOSIS — J90 Pleural effusion, not elsewhere classified: Secondary | ICD-10-CM | POA: Diagnosis not present

## 2015-11-01 DIAGNOSIS — Z91013 Allergy to seafood: Secondary | ICD-10-CM

## 2015-11-01 DIAGNOSIS — I10 Essential (primary) hypertension: Secondary | ICD-10-CM | POA: Diagnosis not present

## 2015-11-01 DIAGNOSIS — A419 Sepsis, unspecified organism: Principal | ICD-10-CM | POA: Diagnosis present

## 2015-11-01 DIAGNOSIS — D62 Acute posthemorrhagic anemia: Secondary | ICD-10-CM | POA: Diagnosis not present

## 2015-11-01 DIAGNOSIS — K253 Acute gastric ulcer without hemorrhage or perforation: Secondary | ICD-10-CM | POA: Diagnosis not present

## 2015-11-01 DIAGNOSIS — N179 Acute kidney failure, unspecified: Secondary | ICD-10-CM | POA: Diagnosis not present

## 2015-11-01 DIAGNOSIS — K228 Other specified diseases of esophagus: Secondary | ICD-10-CM | POA: Diagnosis present

## 2015-11-01 DIAGNOSIS — K2210 Ulcer of esophagus without bleeding: Secondary | ICD-10-CM | POA: Diagnosis not present

## 2015-11-01 DIAGNOSIS — Z87891 Personal history of nicotine dependence: Secondary | ICD-10-CM

## 2015-11-01 DIAGNOSIS — D72829 Elevated white blood cell count, unspecified: Secondary | ICD-10-CM | POA: Diagnosis not present

## 2015-11-01 DIAGNOSIS — Z888 Allergy status to other drugs, medicaments and biological substances status: Secondary | ICD-10-CM

## 2015-11-01 DIAGNOSIS — K920 Hematemesis: Secondary | ICD-10-CM | POA: Diagnosis not present

## 2015-11-01 DIAGNOSIS — K219 Gastro-esophageal reflux disease without esophagitis: Secondary | ICD-10-CM | POA: Diagnosis present

## 2015-11-01 DIAGNOSIS — Z955 Presence of coronary angioplasty implant and graft: Secondary | ICD-10-CM

## 2015-11-01 DIAGNOSIS — R531 Weakness: Secondary | ICD-10-CM | POA: Diagnosis not present

## 2015-11-01 DIAGNOSIS — G8929 Other chronic pain: Secondary | ICD-10-CM | POA: Diagnosis present

## 2015-11-01 DIAGNOSIS — Z791 Long term (current) use of non-steroidal anti-inflammatories (NSAID): Secondary | ICD-10-CM

## 2015-11-01 DIAGNOSIS — K209 Esophagitis, unspecified: Secondary | ICD-10-CM | POA: Diagnosis not present

## 2015-11-01 DIAGNOSIS — I951 Orthostatic hypotension: Secondary | ICD-10-CM | POA: Diagnosis present

## 2015-11-01 DIAGNOSIS — Z885 Allergy status to narcotic agent status: Secondary | ICD-10-CM

## 2015-11-01 DIAGNOSIS — R404 Transient alteration of awareness: Secondary | ICD-10-CM | POA: Diagnosis not present

## 2015-11-01 DIAGNOSIS — Z7982 Long term (current) use of aspirin: Secondary | ICD-10-CM

## 2015-11-01 DIAGNOSIS — Z794 Long term (current) use of insulin: Secondary | ICD-10-CM

## 2015-11-01 DIAGNOSIS — Z79899 Other long term (current) drug therapy: Secondary | ICD-10-CM

## 2015-11-01 DIAGNOSIS — E1142 Type 2 diabetes mellitus with diabetic polyneuropathy: Secondary | ICD-10-CM | POA: Diagnosis present

## 2015-11-01 LAB — FOLATE: Folate: 16.6 ng/mL (ref >5.9)

## 2015-11-01 LAB — I-STAT CHEM 8, ED
BUN: 52 mg/dL — AB (ref 6–20)
CALCIUM ION: 1.48 mmol/L — AB (ref 1.12–1.23)
CREATININE: 1.8 mg/dL — AB (ref 0.44–1.00)
Chloride: 116 mmol/L — ABNORMAL HIGH (ref 101–111)
Glucose, Bld: 157 mg/dL — ABNORMAL HIGH (ref 65–99)
HCT: 30 % — ABNORMAL LOW (ref 36.0–46.0)
HEMOGLOBIN: 10.2 g/dL — AB (ref 12.0–15.0)
Potassium: 3.9 mmol/L (ref 3.5–5.1)
Sodium: 146 mmol/L — ABNORMAL HIGH (ref 135–145)
TCO2: 23 mmol/L (ref 0–100)

## 2015-11-01 LAB — PROTIME-INR
INR: 1.32 (ref 0.00–1.49)
Prothrombin Time: 16 seconds — ABNORMAL HIGH (ref 11.6–15.2)

## 2015-11-01 LAB — ABO/RH: ABO/RH(D): AB POS

## 2015-11-01 LAB — CBC WITH DIFFERENTIAL/PLATELET
BASOS ABS: 0 10*3/uL (ref 0.0–0.1)
Basophils Relative: 0 %
EOS ABS: 0 10*3/uL (ref 0.0–0.7)
Eosinophils Relative: 0 %
HEMATOCRIT: 28.6 % — AB (ref 36.0–46.0)
Hemoglobin: 9.6 g/dL — ABNORMAL LOW (ref 12.0–15.0)
LYMPHS ABS: 1.5 10*3/uL (ref 0.7–4.0)
Lymphocytes Relative: 6 %
MCH: 33.4 pg (ref 26.0–34.0)
MCHC: 33.6 g/dL (ref 30.0–36.0)
MCV: 99.7 fL (ref 78.0–100.0)
MONOS PCT: 6 %
Monocytes Absolute: 1.5 10*3/uL — ABNORMAL HIGH (ref 0.1–1.0)
NEUTROS ABS: 22.6 10*3/uL — AB (ref 1.7–7.7)
Neutrophils Relative %: 88 %
Platelets: 292 10*3/uL (ref 150–400)
RBC: 2.87 MIL/uL — AB (ref 3.87–5.11)
RDW: 17.8 % — AB (ref 11.5–15.5)
WBC: 25.6 10*3/uL — AB (ref 4.0–10.5)

## 2015-11-01 LAB — URINALYSIS, ROUTINE W REFLEX MICROSCOPIC
GLUCOSE, UA: NEGATIVE mg/dL
Hgb urine dipstick: NEGATIVE
KETONES UR: NEGATIVE mg/dL
NITRITE: NEGATIVE
PH: 5.5 (ref 5.0–8.0)
Protein, ur: NEGATIVE mg/dL
SPECIFIC GRAVITY, URINE: 1.035 — AB (ref 1.005–1.030)

## 2015-11-01 LAB — HEPATIC FUNCTION PANEL
ALT: 9 U/L — ABNORMAL LOW (ref 14–54)
AST: 13 U/L — ABNORMAL LOW (ref 15–41)
Albumin: 3 g/dL — ABNORMAL LOW (ref 3.5–5.0)
Alkaline Phosphatase: 55 U/L (ref 38–126)
BILIRUBIN TOTAL: 0.3 mg/dL (ref 0.3–1.2)
Total Protein: 6 g/dL — ABNORMAL LOW (ref 6.5–8.1)

## 2015-11-01 LAB — RETICULOCYTES
RBC.: 2.87 MIL/uL — AB (ref 3.87–5.11)
RETIC COUNT ABSOLUTE: 89 10*3/uL (ref 19.0–186.0)
RETIC CT PCT: 3.1 % (ref 0.4–3.1)

## 2015-11-01 LAB — URINE MICROSCOPIC-ADD ON: RBC / HPF: NONE SEEN RBC/hpf (ref 0–5)

## 2015-11-01 LAB — IRON AND TIBC
Iron: 7 ug/dL — ABNORMAL LOW (ref 28–170)
SATURATION RATIOS: 3 % — AB (ref 10.4–31.8)
TIBC: 202 ug/dL — ABNORMAL LOW (ref 250–450)
UIBC: 195 ug/dL

## 2015-11-01 LAB — GLUCOSE, CAPILLARY: GLUCOSE-CAPILLARY: 176 mg/dL — AB (ref 65–99)

## 2015-11-01 LAB — I-STAT CG4 LACTIC ACID, ED
LACTIC ACID, VENOUS: 2.3 mmol/L — AB (ref 0.5–1.9)
Lactic Acid, Venous: 0.72 mmol/L (ref 0.5–1.9)

## 2015-11-01 LAB — HEMOGLOBIN AND HEMATOCRIT, BLOOD
HEMATOCRIT: 23.6 % — AB (ref 36.0–46.0)
Hemoglobin: 8 g/dL — ABNORMAL LOW (ref 12.0–15.0)

## 2015-11-01 LAB — I-STAT TROPONIN, ED: Troponin i, poc: 0.02 ng/mL (ref 0.00–0.08)

## 2015-11-01 LAB — POC OCCULT BLOOD, ED: FECAL OCCULT BLD: POSITIVE — AB

## 2015-11-01 LAB — VITAMIN B12: Vitamin B-12: 1341 pg/mL — ABNORMAL HIGH (ref 180–914)

## 2015-11-01 LAB — PROCALCITONIN: PROCALCITONIN: 17.46 ng/mL

## 2015-11-01 LAB — MAGNESIUM: Magnesium: 2 mg/dL (ref 1.7–2.4)

## 2015-11-01 LAB — FERRITIN: Ferritin: 96 ng/mL (ref 11–307)

## 2015-11-01 LAB — LACTIC ACID, PLASMA: Lactic Acid, Venous: 4.7 mmol/L (ref 0.5–1.9)

## 2015-11-01 LAB — MRSA PCR SCREENING: MRSA by PCR: NEGATIVE

## 2015-11-01 MED ORDER — PIPERACILLIN-TAZOBACTAM 3.375 G IVPB
3.3750 g | Freq: Three times a day (TID) | INTRAVENOUS | Status: DC
  Administered 2015-11-01 – 2015-11-06 (×14): 3.375 g via INTRAVENOUS
  Filled 2015-11-01 (×14): qty 50

## 2015-11-01 MED ORDER — HYDROCODONE-ACETAMINOPHEN 5-325 MG PO TABS
1.0000 | ORAL_TABLET | ORAL | Status: DC | PRN
  Administered 2015-11-01 – 2015-11-09 (×15): 1 via ORAL
  Filled 2015-11-01 (×15): qty 1

## 2015-11-01 MED ORDER — SODIUM CHLORIDE 0.9 % IV BOLUS (SEPSIS): 250.0000 mL | Freq: Once | INTRAVENOUS | Status: DC

## 2015-11-01 MED ORDER — METOPROLOL TARTRATE 5 MG/5ML IV SOLN
2.5000 mg | Freq: Three times a day (TID) | INTRAVENOUS | Status: DC
  Administered 2015-11-01 – 2015-11-03 (×4): 2.5 mg via INTRAVENOUS
  Filled 2015-11-01 (×6): qty 5

## 2015-11-01 MED ORDER — FLUTICASONE PROPIONATE 50 MCG/ACT NA SUSP
2.0000 | Freq: Every day | NASAL | Status: DC | PRN
  Filled 2015-11-01: qty 16

## 2015-11-01 MED ORDER — VANCOMYCIN HCL IN DEXTROSE 750-5 MG/150ML-% IV SOLN
750.0000 mg | INTRAVENOUS | Status: DC
  Administered 2015-11-02: 750 mg via INTRAVENOUS
  Filled 2015-11-01 (×3): qty 150

## 2015-11-01 MED ORDER — SODIUM CHLORIDE 0.9 % IV BOLUS (SEPSIS): 1000.0000 mL | Freq: Once | INTRAVENOUS | Status: DC

## 2015-11-01 MED ORDER — SODIUM CHLORIDE 0.9 % IV BOLUS (SEPSIS)
1000.0000 mL | Freq: Once | INTRAVENOUS | Status: AC
  Administered 2015-11-01: 1000 mL via INTRAVENOUS

## 2015-11-01 MED ORDER — VANCOMYCIN HCL IN DEXTROSE 1-5 GM/200ML-% IV SOLN
1000.0000 mg | Freq: Once | INTRAVENOUS | Status: AC
  Administered 2015-11-01: 1000 mg via INTRAVENOUS
  Filled 2015-11-01: qty 200

## 2015-11-01 MED ORDER — PANTOPRAZOLE SODIUM 40 MG IV SOLR: 40.0000 mg | Freq: Two times a day (BID) | INTRAVENOUS | Status: DC

## 2015-11-01 MED ORDER — INSULIN ASPART 100 UNIT/ML ~~LOC~~ SOLN
0.0000 [IU] | Freq: Three times a day (TID) | SUBCUTANEOUS | Status: DC
  Administered 2015-11-03 – 2015-11-04 (×2): 2 [IU] via SUBCUTANEOUS
  Administered 2015-11-04: 5 [IU] via SUBCUTANEOUS
  Administered 2015-11-05: 3 [IU] via SUBCUTANEOUS
  Administered 2015-11-07: 4 [IU] via SUBCUTANEOUS
  Administered 2015-11-07: 2 [IU] via SUBCUTANEOUS
  Administered 2015-11-07: 5 [IU] via SUBCUTANEOUS
  Administered 2015-11-08: 2 [IU] via SUBCUTANEOUS
  Administered 2015-11-08 (×2): 3 [IU] via SUBCUTANEOUS
  Administered 2015-11-09: 5 [IU] via SUBCUTANEOUS

## 2015-11-01 MED ORDER — GI COCKTAIL ~~LOC~~
30.0000 mL | Freq: Once | ORAL | Status: AC
  Administered 2015-11-01: 30 mL via ORAL
  Filled 2015-11-01: qty 30

## 2015-11-01 MED ORDER — SODIUM CHLORIDE 0.9 % IV SOLN
INTRAVENOUS | Status: DC
  Administered 2015-11-01 – 2015-11-02 (×2): via INTRAVENOUS

## 2015-11-01 MED ORDER — SODIUM CHLORIDE 0.9% FLUSH
3.0000 mL | Freq: Two times a day (BID) | INTRAVENOUS | Status: DC
  Administered 2015-11-02 – 2015-11-06 (×4): 3 mL via INTRAVENOUS

## 2015-11-01 MED ORDER — SODIUM CHLORIDE 0.9 % IV SOLN
80.0000 mg | INTRAVENOUS | Status: AC
  Administered 2015-11-01: 80 mg via INTRAVENOUS
  Filled 2015-11-01: qty 80

## 2015-11-01 MED ORDER — SODIUM CHLORIDE 0.9 % IV SOLN
8.0000 mg/h | INTRAVENOUS | Status: DC
  Administered 2015-11-01 – 2015-11-02 (×2): 8 mg/h via INTRAVENOUS
  Filled 2015-11-01 (×5): qty 80

## 2015-11-01 MED ORDER — CLOZAPINE 25 MG PO TABS
150.0000 mg | ORAL_TABLET | Freq: Every day | ORAL | Status: DC
  Administered 2015-11-01 – 2015-11-08 (×8): 150 mg via ORAL
  Filled 2015-11-01 (×8): qty 2

## 2015-11-01 MED ORDER — PIPERACILLIN-TAZOBACTAM 3.375 G IVPB 30 MIN
3.3750 g | Freq: Once | INTRAVENOUS | Status: AC
  Administered 2015-11-01: 3.375 g via INTRAVENOUS
  Filled 2015-11-01: qty 50

## 2015-11-01 MED ORDER — DONEPEZIL HCL 10 MG PO TABS
10.0000 mg | ORAL_TABLET | Freq: Every day | ORAL | Status: DC
  Administered 2015-11-01 – 2015-11-08 (×8): 10 mg via ORAL
  Filled 2015-11-01 (×8): qty 1

## 2015-11-01 MED ORDER — SODIUM CHLORIDE 0.9 % IV SOLN: 80.0000 mg | Freq: Once | INTRAVENOUS | Status: DC

## 2015-11-01 MED ORDER — MEMANTINE HCL 10 MG PO TABS
10.0000 mg | ORAL_TABLET | Freq: Two times a day (BID) | ORAL | Status: DC
  Administered 2015-11-01 – 2015-11-09 (×16): 10 mg via ORAL
  Filled 2015-11-01: qty 1
  Filled 2015-11-01: qty 2
  Filled 2015-11-01: qty 1
  Filled 2015-11-01: qty 2
  Filled 2015-11-01 (×3): qty 1
  Filled 2015-11-01: qty 2
  Filled 2015-11-01 (×3): qty 1
  Filled 2015-11-01: qty 2
  Filled 2015-11-01 (×4): qty 1

## 2015-11-01 NOTE — ED Notes (Signed)
DELAY IN [facility] MD PRESENT SPEAKING WITH FAMILY AND PT

## 2015-11-01 NOTE — Progress Notes (Signed)
Utilization Review completed.  Jonn Chaikin RN CM  

## 2015-11-01 NOTE — ED Notes (Signed)
Notified PA Bowie of Lactic 2.30

## 2015-11-01 NOTE — Progress Notes (Signed)
CRITICAL VALUE ALERT  Critical value received: Lactic   Date of notification:  11/01/15  Time of notification:  2120  Critical value read back:yes  Nurse who received alert: Theodosia Quay RN   MD notified (1st page): 2130   Time of first page:  2130   MD notified (2nd page):  Time of second page:  Responding MD:    Time MD responded:

## 2015-11-01 NOTE — ED Notes (Signed)
2ND EKG PERFORMED

## 2015-11-01 NOTE — ED Notes (Signed)
Failed attempt to collect blood.

## 2015-11-01 NOTE — ED Notes (Signed)
Per EMS, patient is from facility at [address] [place], [place].  Patient is complaining of n/v/d.  Patient states her stool is black.  She is cold to touch and lethargic.  She has had a partial mastectomy on left side.  BP:  Supine 138/70  Sitting up: 86/48 with pulse of 132  BP:110/74 after lying flat and giving fluids.  HR was 120  87% on room air and 97% on 2 Liters  CBG:161

## 2015-11-01 NOTE — ED Notes (Signed)
EMS PT ON [facility] UPON ARRIVAL. DOCUMENTED RA 02 SATS FOR RECORD. PT WITHOUT COMPLAINT. PLACED ON [facility] FOR SUPPORT

## 2015-11-01 NOTE — ED Notes (Signed)
BLOOD CULTURE X 1 OBTAINED 3CC

## 2015-11-01 NOTE — Progress Notes (Signed)
Pharmacy Antibiotic Note  [name] is a 75 y.o. female admitted on 11/01/2015 with sepsis. Per ED notes, this is likely of a pulmonary etiology.  CXR indicated infiltrate in the right low lobe and right upper lobe. Pt is also being evaluated for GI bleed.   Pt has a hx of DM, anemia, anxiety, GERD, HTN, dementia, schizophrenia, CAD, breast CA brought here via EMS for evaluation of weakness. Current SCr is 1.80, which is above pt baseline.   Pharmacy has been consulted for Zosyn/vancomycin dosing.  Plan: Vancomycin 1000 mg IV x1 in ER, then vancomycin 750 mg IV q24. Zosyn 3.375 gr IV q8h EI.   Height: 5' (152.4 cm) Weight: 156 lb (70.761 kg) IBW/kg (Calculated) : 45.5  Temp (24hrs), Avg:98.2 F (36.8 C), Min:98.2 F (36.8 C), Max:98.2 F (36.8 C)   Recent Labs Lab 11/01/15 1236 11/01/15 1300  WBC 25.6*  --   CREATININE  --  1.80*  LATICACIDVEN  --  2.30*    Estimated Creatinine Clearance: 24.1 mL/min (by C-G formula based on Cr of 1.8).    Allergies  Allergen Reactions  . Codeine Phosphate Other (See Comments)    REACTION: unspecified  . Glimepiride Other (See Comments)    REACTION: unspecified  . Glyburide Other (See Comments)    REACTION: unspecified  . Levofloxacin Nausea Only    Made her weak and nauseated  . Shellfish Allergy Swelling  . Tramadol Other (See Comments)    unknown    Antimicrobials this admission: 7/12 vancomycin >>  7/12 Zosyn >>   Dose adjustments this admission: ---  Microbiology results: ---   Thank you for allowing pharmacy to be a part of this patient's care.  Royetta Asal, PharmD, BCPS Pager [phone] 11/01/2015 3:06 PM

## 2015-11-01 NOTE — ED Notes (Signed)
EDPA present. Pt took over the recommended dosage pepto bisol x 1 week.Two cups (measure cup it comes with daily x 1 week). Acid reflux increase x 1 week. Increased symptoms over the week.

## 2015-11-01 NOTE — ED Notes (Signed)
NATALIE RN ATTEMPTING [place] IV

## 2015-11-01 NOTE — ED Notes (Signed)
RN Starting line and will draw labs

## 2015-11-01 NOTE — ED Notes (Signed)
ED PA at bedside

## 2015-11-01 NOTE — H&P (Signed)
History and Physical  [name] [id number] DOB: 1940-06-10 DOA: 11/01/2015  Referring physician: EDP PCP: [name], MD   Chief Complaint: hematemesis, dark stool, lethargy, hypoxia, from ALF   HPI: [name] is a 75 y.o. female  From ALF with h/o mild dementia, schizophrenia, h/o insulin dependent dm2, HTn, CAD, breast ca s/p lumpectomy only (no need of chemo or XRT) in 08/2015, she is sent to [facility] ED by EMS due to found to have hematemesis, dark stool for the past several days, per EMS, patient has orthostatic hypotension, bp 86/48, heart rate 132 upon sitting up, she is also found to be hypoxic 02sats 87% on room air, she was put on oxygen and sent to ED. Her FOBT is positive. cxr with pna, labs with leukocytosis and lactic acidosis, ARF, hgb 9,6, repeat hgb 10.2, blood culture and urine culture obtained in the ED, she received ivf, iv protonix, vanc/zosyn, GI consulted by EDP, hospitalist called to admit the patient.  When I  Entered the room, Patient is drowsy, remain sinus tachycardia, her daughter is in the room , patient reports that she has significant acid reflux symptoms with epigastric pain, she denies fever, does report has been sweating, she denies significant cough, she report has been on over the counter medicine for her chronic back pain.   Review of Systems:  Detail per HPI, Review of systems are otherwise negative  Past Medical History  Diagnosis Date  . Anemia, iron deficiency   . Anxiety   . Type II or unspecified type diabetes mellitus without mention of complication, not stated as uncontrolled   . GERD (gastroesophageal reflux disease)   . HTN (hypertension)   . Peripheral neuropathy ([place])   . Hyperlipidemia   . Allergic rhinitis   . PVD (peripheral vascular disease) ([facility])     Left subclavian 70% stenosis  . Dementia   . IBS (irritable bowel syndrome)   . Syncope     EP study 1996 w/atrial tachycardia  . Esophageal motility disorder   . Renal  insufficiency   . Schizophrenia ([place])     paranoid schizophrenic  . Arthritis     back  . CAD (coronary artery disease)     CAD stent placed 2005  . Pneumonia 12-09-2014    adm with CAP requiring mechanical ventilation x 10d  . Cancer [facility])     Left breast invasive ductal carcinoma   Past Surgical History  Procedure Laterality Date  . Abdominal hysterectomy    . Ptca  1995  . Knee arthroplasty      Right   . Breast biopsy      Negative  . Joint replacement Bilateral   . Radioactive seed guided mastectomy with axillary sentinel lymph node biopsy Left 08/17/2015    Procedure: RADIOACTIVE SEED GUIDED LUMPECTOMY WITH AXILLARY SENTINEL LYMPH NODE BIOPSY;  Surgeon: Donnie Mesa, MD;  Location: [place];  Service: General;  Laterality: Left;  . Re-excision of breast lumpectomy Left 09/07/2015    Procedure: RE-EXCISION OF LEFT BREAST LUMPECTOMY;  Surgeon: Donnie Mesa, MD;  Location: [place];  Service: General;  Laterality: Left;   Social History:  reports that she has quit smoking. She has never used smokeless tobacco. She reports that she does not drink alcohol or use illicit drugs. Patient lives at ALF& is able to participate in activities of daily living independently, walked with a walker  Allergies  Allergen Reactions  . Codeine Phosphate Other (See Comments)    REACTION:  unspecified  . Glimepiride Other (See Comments)    REACTION: unspecified  . Glyburide Other (See Comments)    REACTION: unspecified  . Levofloxacin Nausea Only    Made her weak and nauseated  . Shellfish Allergy Swelling  . Tramadol Other (See Comments)    unknown    Family History  Problem Relation Age of Onset  . Schizophrenia Neg Hx   . Coronary artery disease Neg Hx   . Colon cancer Neg Hx   . Breast cancer Neg Hx   . Hypertension Other   . Diabetes Other       Prior to Admission medications   Medication Sig Start Date End Date Taking? Authorizing Provider    acetaminophen (TYLENOL) 500 MG tablet Take 500-1,000 mg by mouth 3 (three) times daily as needed. For back pain    Yes Historical Provider, MD  amLODipine (NORVASC) 5 MG tablet Take 5 mg by mouth daily.   Yes Historical Provider, MD  aspirin 325 MG tablet Take 325 mg by mouth daily.     Yes Historical Provider, MD  atorvastatin (LIPITOR) 40 MG tablet Take 40 mg by mouth daily.   Yes Historical Provider, MD  bismuth subsalicylate (PEPTO BISMOL) 262 MG/15ML suspension Take 30 mLs by mouth every 6 (six) hours as needed for indigestion or diarrhea or loose stools.   Yes Historical Provider, MD  cloZAPine (CLOZARIL) 100 MG tablet Take 1 tablet (100 mg total) by mouth at bedtime. Patient taking differently: Take 150 mg by mouth at bedtime.  12/27/14  Yes Maryellen Pile, MD  donepezil (ARICEPT) 10 MG tablet Take 10 mg by mouth at bedtime.   Yes Historical Provider, MD  esomeprazole (NEXIUM) 40 MG capsule Take 40 mg by mouth daily.  06/23/15  Yes Historical Provider, MD  fluticasone (FLONASE) 50 MCG/ACT nasal spray Place 2 sprays into both nostrils daily as needed for allergies.  02/22/13  Yes Historical Provider, MD  furosemide (LASIX) 40 MG tablet Take 20 mg by mouth daily.    Yes Historical Provider, MD  gabapentin (NEURONTIN) 100 MG capsule Take 100 mg by mouth 4 (four) times daily. 09/04/15  Yes Historical Provider, MD  haloperidol (HALDOL) 5 MG tablet Take 5-10 mg by mouth 2 (two) times daily. Take 1 in the morning and 2 at night   Yes Historical Provider, MD  HYDROcodone-acetaminophen (NORCO/VICODIN) 5-325 MG tablet Take 1 tablet by mouth every 4 (four) hours as needed. 08/17/15  Yes Donnie Mesa, MD  insulin glargine (LANTUS SOLOSTAR) 100 UNIT/ML injection Inject 15 Units into the skin at bedtime. 03/21/11  Yes Neena Rhymes, MD  levocetirizine (XYZAL) 5 MG tablet Take 5 mg by mouth every evening.  03/05/13  Yes Historical Provider, MD  memantine (NAMENDA) 10 MG tablet Take 1 tablet (10 mg total) by  mouth 2 (two) times daily. 10/09/12  Yes Neena Rhymes, MD  mirtazapine (REMERON) 30 MG tablet Take 30 mg by mouth at bedtime.   Yes Historical Provider, MD  Multiple Vitamins-Minerals (MULTIVITAMIN,TX-MINERALS) tablet Take 1 tablet by mouth daily.     Yes Historical Provider, MD  omeprazole (PRILOSEC) 40 MG capsule Take 40 mg by mouth daily.   Yes Historical Provider, MD  pioglitazone (ACTOS) 30 MG tablet Take 1 tablet (30 mg total) by mouth daily. 05/21/12  Yes Neena Rhymes, MD  potassium chloride (KLOR-CON) 8 MEQ tablet Take 1 tablet (8 mEq total) by mouth daily. 04/28/12  Yes Neena Rhymes, MD  ramipril (ALTACE) 10 MG  capsule Take 2 capsules (20 mg total) by mouth daily. 12/27/14  Yes Maryellen Pile, MD  traZODone (DESYREL) 100 MG tablet Take 100 mg by mouth at bedtime as needed. sleep 10/19/15  Yes Historical Provider, MD  valsartan (DIOVAN) 160 MG tablet Take 160 mg by mouth daily.   Yes Historical Provider, MD  amLODipine (NORVASC) 10 MG tablet Take 1 tablet (10 mg total) by mouth daily. Patient not taking: Reported on 11/01/2015 10/09/12   Neena Rhymes, MD  ciprofloxacin (CIPRO) 500 MG tablet Take 500 mg by mouth 2 (two) times daily. Reported on 11/01/2015 10/20/15   Historical Provider, MD  sulfamethoxazole-trimethoprim (BACTRIM DS,SEPTRA DS) 800-160 MG tablet Take 1 tablet by mouth 2 (two) times daily. Reported on 11/01/2015 10/23/15   Historical Provider, MD    Physical Exam: BP 124/65 mmHg  Pulse 112  Temp(Src) 98.2 F (36.8 C) (Oral)  Resp 22  Ht 5' (1.524 m)  Wt 70.761 kg (156 lb)  BMI 30.47 kg/m2  SpO2 97%  General:  Drowsy, but oriented Eyes: PERRL ENT: unremarkable Neck: supple, no JVD Cardiovascular: sinus tachycardia Respiratory: crackles right lung fields Abdomen: mild epigastric tenderness, soft/ND/, positive bowel sounds Skin: no rash Musculoskeletal:  No edema Psychiatric: calm/cooperative Neurologic: no focal findings            Labs on Admission:    Basic Metabolic Panel:  Recent Labs Lab 11/01/15 1300  NA 146*  K 3.9  CL 116*  GLUCOSE 157*  BUN 52*  CREATININE 1.80*   Liver Function Tests:  Recent Labs Lab 11/01/15 1236  AST 13*  ALT 9*  ALKPHOS 55  BILITOT 0.3  PROT 6.0*  ALBUMIN 3.0*   No results for input(s): LIPASE, AMYLASE in the last 168 hours. No results for input(s): AMMONIA in the last 168 hours. CBC:  Recent Labs Lab 11/01/15 1236 11/01/15 1300  WBC 25.6*  --   NEUTROABS 22.6*  --   HGB 9.6* 10.2*  HCT 28.6* 30.0*  MCV 99.7  --   PLT 292  --    Cardiac Enzymes: No results for input(s): CKTOTAL, CKMB, CKMBINDEX, TROPONINI in the last 168 hours.  BNP (last 3 results)  Recent Labs  12/09/14 1415 12/10/14 2311  BNP 83.0 143.0*    ProBNP (last 3 results) No results for input(s): PROBNP in the last 8760 hours.  CBG: No results for input(s): GLUCAP in the last 168 hours.  Radiological Exams on Admission: Dg Abd Acute W/chest  11/01/2015  CLINICAL DATA:  Peptic ulcer disease, concern for GI bleed, check for free air. Abdominal pain. EXAM: DG ABDOMEN ACUTE W/ 1V CHEST COMPARISON:  08/22/2015 FINDINGS: Patchy airspace disease in the right upper lobe and right lower lobe, possible pneumonia. This was not present on the recent study. No effusion. Left lung clear. Nonobstructive bowel gas pattern.  No free air. Surgical clips in the gallbladder fossa. Lumbar levoscoliosis. No acute skeletal abnormality. No urinary tract calculi. IMPRESSION: Infiltrate in the right lower lobe and right upper lobe, suspicious for pneumonia Normal bowel gas pattern.  No free air. Electronically Signed   By: Franchot Gallo M.D.   On: 11/01/2015 13:29    EKG: Independently reviewed. Sinus tachycardia, QTc prolonged at 502, no acute st/t changes  Assessment/Plan Present on Admission:  **None**   GI bleed with hematemesis, melena, concerning for gastric ulcer, she is started on protonix drip, hgb q6hrs, on full  liquid diet, hold asa. gi consulted by EDP.  Sepsis/PNA/acute hypoxic respiratory failure;  sepsis order sets utilized, aspiration pna vs health care associated pna, on vanc/zosyn for now, continue ivf. Hold home bp meds, hold lasix, starter her on low dose iv lopressor with holding parameters due to sinus tachycardia,  She does has recent h/o breast ca s/p resection, with hypoxia and sinus tachycardia, so PE also in differential , but she is not able to get CTA for now due to arf, she is not a candidate for anticoagulation due to gi bleed, VQ scan could be low yield due to lung infiltrate,  i have discussed with family, likely will get cta once cr normalized. For now will get echocardiogram.  Confusion/lethargy, likely from infection and metabolic abnormalities, ct head pending.  ARF: likely prerenal from sepsis, gi bleed, dehydration, concentrated urine, continue ivf, renal dosing meds  Insulin dependent DM2, hold lantus , start ssi, check a1c  QTc prolongation, keep k>4, mag>2, hold haldol for now, repeat ekg in am  Psych: continue clozapine, dementia meds, hold haldol for now  DVT prophylaxis: scd  Consultants: gastroenterology called by EDP  Code Status: full   Family Communication:  Patient and daughter in room   Disposition Plan: admit to stepdown  Time spent: 80mins  Caulder Wehner MD, PhD [facility] Pager [phone] If 7PM-7AM, please contact night-coverage at [email or web address], password [facility]

## 2015-11-01 NOTE — ED Provider Notes (Signed)
CSN: [id number]     Arrival date & time 11/01/15  1110 History   First MD Initiated Contact with Patient 11/01/15 1121     Chief Complaint  Patient presents with  . Weakness  . GI Bleeding  . Hypotension     (Consider location/radiation/quality/duration/timing/severity/associated sxs/prior Treatment) HPI   75 year old female with hx of DM, anemia, anxiety, GERD, HTN, dementia, schizophrenia, CAD, breast CA brought here via EMS for evaluation of weakness.  Patient reports she has a history of heartburn which is worsened after she ate spaghetti last week. Since then she has been complaining of burning sensation to the epigastrium. Her pain worsening when she lays flat. She also complaining of lightheadedness, dizziness, weakness and tired. She has been taking maximum strength Pepto-Bismol daily. She noticed that her vomiting has been talking coffee ground with black stools. Vomiting and diarrhea less than 5 times a day. She denies having headache, chest pain, difficulty breathing, dysuria, focal numbness. History of breast cancer status post lumpectomy. No prior history of blood transfusion. She is currently not on any blood thinner medication. Denies any other medication changes. Patient is a full code.  EMS noted that pt has evidence of orthostatic hypotension (BP: Supine 138/70 Sitting up 86/48 with pulse of 132)   Past Medical History  Diagnosis Date  . Anemia, iron deficiency   . Anxiety   . Type II or unspecified type diabetes mellitus without mention of complication, not stated as uncontrolled   . GERD (gastroesophageal reflux disease)   . HTN (hypertension)   . Peripheral neuropathy ([place])   . Hyperlipidemia   . Allergic rhinitis   . PVD (peripheral vascular disease) (HCC)     Left subclavian 70% stenosis  . Dementia   . IBS (irritable bowel syndrome)   . Syncope     EP study 1996 w/atrial tachycardia  . Esophageal motility disorder   . Renal insufficiency   . 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 Arthritis     back  . CAD (coronary artery disease)     CAD stent placed 2005  . Pneumonia 12-09-2014    adm with CAP requiring mechanical ventilation x 10d  . Cancer [facility])     Left breast invasive ductal carcinoma   Past Surgical History  Procedure Laterality Date  . Abdominal hysterectomy    . Ptca  1995  . Knee arthroplasty      Right   . Breast biopsy      Negative  . Joint replacement Bilateral   . Radioactive seed guided mastectomy with axillary sentinel lymph node biopsy Left 08/17/2015    Procedure: RADIOACTIVE SEED GUIDED LUMPECTOMY WITH AXILLARY SENTINEL LYMPH NODE BIOPSY;  Surgeon: Donnie Mesa, MD;  Location: [place];  Service: General;  Laterality: Left;  . Re-excision of breast lumpectomy Left 09/07/2015    Procedure: RE-EXCISION OF LEFT BREAST LUMPECTOMY;  Surgeon: Donnie Mesa, MD;  Location: [place];  Service: General;  Laterality: Left;   Family History  Problem Relation Age of Onset  . Schizophrenia Neg Hx   . Coronary artery disease Neg Hx   . Colon cancer Neg Hx   . Breast cancer Neg Hx   . Hypertension Other   . Diabetes Other    Social History  Substance Use Topics  . Smoking status: Former Research scientist (life sciences)  . Smokeless tobacco: Never Used  . Alcohol Use: No   OB History    No data  available     Review of Systems  All other systems reviewed and are negative.     Allergies  Codeine phosphate; Glimepiride; Glyburide; Levofloxacin; Shellfish allergy; and Tramadol  Home Medications   Prior to Admission medications   Medication Sig Start Date End Date Taking? Authorizing Provider  acetaminophen (TYLENOL) 500 MG tablet Take 500-1,000 mg by mouth 3 (three) times daily as needed. For back pain     Historical Provider, MD  amLODipine (NORVASC) 10 MG tablet Take 1 tablet (10 mg total) by mouth daily. 10/09/12   Neena Rhymes, MD  aspirin 325 MG tablet Take 325 mg by mouth daily.       Historical Provider, MD  atorvastatin (LIPITOR) 40 MG tablet Take 40 mg by mouth daily.    Historical Provider, MD  cloZAPine (CLOZARIL) 100 MG tablet Take 1 tablet (100 mg total) by mouth at bedtime. Patient taking differently: Take 150 mg by mouth at bedtime.  12/27/14   Maryellen Pile, MD  donepezil (ARICEPT) 10 MG tablet Take 10 mg by mouth at bedtime.    Historical Provider, MD  esomeprazole (NEXIUM) 40 MG capsule Take 40 mg by mouth daily.  06/23/15   Historical Provider, MD  ferrous sulfate 325 (65 FE) MG tablet Take 325 mg by mouth daily with breakfast.    Historical Provider, MD  fluticasone (FLONASE) 50 MCG/ACT nasal spray Place 2 sprays into both nostrils daily as needed for allergies.  02/22/13   Historical Provider, MD  furosemide (LASIX) 40 MG tablet Take 20 mg by mouth daily.     Historical Provider, MD  haloperidol (HALDOL) 5 MG tablet Take 5-10 mg by mouth 2 (two) times daily. Take 1 in the morning and 2 at night    Historical Provider, MD  HYDROcodone-acetaminophen (NORCO/VICODIN) 5-325 MG tablet Take 1 tablet by mouth every 4 (four) hours as needed. 08/17/15   Donnie Mesa, MD  insulin glargine (LANTUS SOLOSTAR) 100 UNIT/ML injection Inject 15 Units into the skin at bedtime. 03/21/11   Neena Rhymes, MD  levocetirizine (XYZAL) 5 MG tablet Take 5 mg by mouth every evening.  03/05/13   Historical Provider, MD  memantine (NAMENDA) 10 MG tablet Take 1 tablet (10 mg total) by mouth 2 (two) times daily. 10/09/12   Neena Rhymes, MD  mirtazapine (REMERON) 30 MG tablet Take 30 mg by mouth at bedtime.    Historical Provider, MD  Multiple Vitamins-Minerals (MULTIVITAMIN,TX-MINERALS) tablet Take 1 tablet by mouth daily.      Historical Provider, MD  omeprazole (PRILOSEC) 40 MG capsule Take 40 mg by mouth daily.    Historical Provider, MD  pioglitazone (ACTOS) 30 MG tablet Take 1 tablet (30 mg total) by mouth daily. 05/21/12   Neena Rhymes, MD  potassium chloride (KLOR-CON) 8 MEQ tablet  Take 1 tablet (8 mEq total) by mouth daily. 04/28/12   Neena Rhymes, MD  ramipril (ALTACE) 10 MG capsule Take 2 capsules (20 mg total) by mouth daily. 12/27/14   Maryellen Pile, MD   BP 121/65 mmHg  Pulse 120  Temp(Src) 98.2 F (36.8 C) (Oral)  Resp 16  SpO2 93% Physical Exam  Constitutional: She is oriented to person, place, and time. She appears well-developed and well-nourished. No distress.  Ill appearing African-American female laying in bed  HENT:  Head: Atraumatic.  Lips are dry, pale oral mucosa. Partial upper denture  Eyes: Conjunctivae are normal.  Neck: Neck supple.  Cardiovascular:  Tachycardia without murmurs rubs gallops  Pulmonary/Chest:  Lung with poor effort but no wheezes, rales, or rhonchi heard  Abdominal: Soft. There is tenderness (Diffuse abdominal tenderness without guarding or rebound tenderness. Abdomen is nondistended.).  Neurological: She is alert and oriented to person, place, and time.  Skin: No rash noted. There is pallor.  Psychiatric: She has a normal mood and affect.  Nursing note and vitals reviewed.   ED Course  Procedures (including critical care time) Labs Review Labs Reviewed  CBC WITH DIFFERENTIAL/PLATELET - Abnormal; Notable for the following:    WBC 25.6 (*)    RBC 2.87 (*)    Hemoglobin 9.6 (*)    HCT 28.6 (*)    RDW 17.8 (*)    Neutro Abs 22.6 (*)    Monocytes Absolute 1.5 (*)    All other components within normal limits  HEPATIC FUNCTION PANEL - Abnormal; Notable for the following:    Total Protein 6.0 (*)    Albumin 3.0 (*)    AST 13 (*)    ALT 9 (*)    Bilirubin, Direct <0.1 (*)    All other components within normal limits  VITAMIN B12 - Abnormal; Notable for the following:    Vitamin B-12 1341 (*)    All other components within normal limits  IRON AND TIBC - Abnormal; Notable for the following:    Iron 7 (*)    TIBC 202 (*)    Saturation Ratios 3 (*)    All other components within normal limits  RETICULOCYTES -  Abnormal; Notable for the following:    RBC. 2.87 (*)    All other components within normal limits  I-STAT CHEM 8, ED - Abnormal; Notable for the following:    Sodium 146 (*)    Chloride 116 (*)    BUN 52 (*)    Creatinine, Ser 1.80 (*)    Glucose, Bld 157 (*)    Calcium, Ion 1.48 (*)    Hemoglobin 10.2 (*)    HCT 30.0 (*)    All other components within normal limits  I-STAT CG4 LACTIC ACID, ED - Abnormal; Notable for the following:    Lactic Acid, Venous 2.30 (*)    All other components within normal limits  POC OCCULT BLOOD, ED - Abnormal; Notable for the following:    Fecal Occult Bld POSITIVE (*)    All other components within normal limits  CULTURE, BLOOD (ROUTINE X 2)  CULTURE, BLOOD (ROUTINE X 2)  URINE CULTURE  FERRITIN  FOLATE  URINALYSIS, ROUTINE W REFLEX MICROSCOPIC (NOT AT [facility])  I-STAT CG4 LACTIC ACID, ED  I-STAT TROPOININ, ED  TYPE AND SCREEN  ABO/RH    Imaging Review Dg Abd Acute W/chest  11/01/2015  CLINICAL DATA:  Peptic ulcer disease, concern for GI bleed, check for free air. Abdominal pain. EXAM: DG ABDOMEN ACUTE W/ 1V CHEST COMPARISON:  08/22/2015 FINDINGS: Patchy airspace disease in the right upper lobe and right lower lobe, possible pneumonia. This was not present on the recent study. No effusion. Left lung clear. Nonobstructive bowel gas pattern.  No free air. Surgical clips in the gallbladder fossa. Lumbar levoscoliosis. No acute skeletal abnormality. No urinary tract calculi. IMPRESSION: Infiltrate in the right lower lobe and right upper lobe, suspicious for pneumonia Normal bowel gas pattern.  No free air. Electronically Signed   By: Franchot Gallo M.D.   On: 11/01/2015 13:29   I have personally reviewed and evaluated these images and lab results as part of my medical decision-making.   EKG  Interpretation None     ED ECG REPORT   Date: 11/01/2015  Rate: 125  Rhythm: sinus tachycardia  QRS Axis: normal  Intervals: Borderline Prolonged QT  interval  ST/T Wave abnormalities: normal  Conduction Disutrbances:none  Narrative Interpretation:   Old EKG Reviewed: unchanged  I have personally reviewed the EKG tracing and agree with the computerized printout as noted.   MDM   Final diagnoses:  Upper GI bleed  HCAP (healthcare-associated pneumonia)  Shortness of breath    BP 124/65 mmHg  Pulse 112  Temp(Src) 98.2 F (36.8 C) (Oral)  Resp 22  Ht 5' (1.524 m)  Wt 70.761 kg  BMI 30.47 kg/m2  SpO2 97%   12:33 PM Patient with history of heartburn here with coffee-ground emesis and black stool concerning for upper GI bleed. She does have diffuse abdominal discomfort, but abdomen is non distended. Does have melena on rectal examination, hemoccult positive.  Work up initiated.  Care discussed with Dr. Kathrynn Humble.    2:41 PM Labs remarkable with a BUN of 52, creatinine 1.8, this is markedly elevated as compared to baseline, and likely reflects GI bleed.. Her current hemoglobin is 9.6. Elevated WBC of 25.6. An acute abdomen series with chest x-ray demonstrated infiltrate in the right low lobe and right upper lobe suspicious for pneumonia. Patient admits that she did cough last night and some today. She has history of breast cancer status post lumpectomy but currently not on chemotherapy. She did endorse some shortness of breath last night. Patient now giving broad spectrum antibiotic to cover for potential pulmonary infectious etiology.  Pt has a Well's criteria of 2.5, which puts her in a moderate risk group.  If she shows no improvement of her sxs after abx, she will need to be work up for PE.    Appreciate consultation from GI specialist PA Earnie Larsson, who agrees to see pt in the ER.  Will consult medicine for admission.    Pt currently receiving IV Protonix and IVF.  Code sepsis initiated.  EMS note indicate pt's O2 sats 87% on RA, 97% on 2L supplemental oxygenation.    3:19 PM Appreciate consultation from [facility],  Dr. Erlinda Hong who agrees to see pt in the ER and will admit for further care.  She request pt to be admitted to step down, under her care.    3:29 PM Repeat sepsis assessment completed.    CRITICAL CARE Performed by: Domenic Moras Total critical care time: 40 minutes Critical care time was exclusive of separately billable procedures and treating other patients. Critical care was necessary to treat or prevent imminent or life-threatening deterioration. Critical care was time spent personally by me on the following activities: development of treatment plan with patient and/or surrogate as well as nursing, discussions with consultants, evaluation of patient's response to treatment, examination of patient, obtaining history from patient or surrogate, ordering and performing treatments and interventions, ordering and review of laboratory studies, ordering and review of radiographic studies, pulse oximetry and re-evaluation of patient's condition.    Domenic Moras, PA-C 11/01/15 [place], MD 11/02/15 [phone]

## 2015-11-01 NOTE — ED Notes (Signed)
Patient transported to X-ray 

## 2015-11-02 ENCOUNTER — Inpatient Hospital Stay (HOSPITAL_COMMUNITY): Payer: Medicare Other

## 2015-11-02 ENCOUNTER — Encounter (HOSPITAL_COMMUNITY)
Admission: EM | Disposition: A | Discharge status: Home Health | Payer: Self-pay | Source: Home / Self Care | Attending: Internal Medicine

## 2015-11-02 ENCOUNTER — Encounter (HOSPITAL_COMMUNITY): Payer: Self-pay | Admitting: *Deleted

## 2015-11-02 DIAGNOSIS — J9601 Acute respiratory failure with hypoxia: Secondary | ICD-10-CM

## 2015-11-02 DIAGNOSIS — R6521 Severe sepsis with septic shock: Secondary | ICD-10-CM

## 2015-11-02 DIAGNOSIS — A419 Sepsis, unspecified organism: Secondary | ICD-10-CM | POA: Diagnosis present

## 2015-11-02 DIAGNOSIS — J181 Lobar pneumonia, unspecified organism: Secondary | ICD-10-CM

## 2015-11-02 DIAGNOSIS — D62 Acute posthemorrhagic anemia: Secondary | ICD-10-CM | POA: Diagnosis present

## 2015-11-02 DIAGNOSIS — N179 Acute kidney failure, unspecified: Secondary | ICD-10-CM | POA: Diagnosis present

## 2015-11-02 HISTORY — PX: ESOPHAGOGASTRODUODENOSCOPY: SHX5428

## 2015-11-02 LAB — GLUCOSE, CAPILLARY
GLUCOSE-CAPILLARY: 99 mg/dL (ref 65–99)
GLUCOSE-CAPILLARY: 91 mg/dL (ref 65–99)
GLUCOSE-CAPILLARY: 93 mg/dL (ref 65–99)
GLUCOSE-CAPILLARY: 117 mg/dL — AB (ref 65–99)

## 2015-11-02 LAB — BASIC METABOLIC PANEL
ANION GAP: 10 (ref 5–15)
BUN: 47 mg/dL — ABNORMAL HIGH (ref 6–20)
CALCIUM: 9.5 mg/dL (ref 8.9–10.3)
CO2: 15 mmol/L — ABNORMAL LOW (ref 22–32)
Chloride: 120 mmol/L — ABNORMAL HIGH (ref 101–111)
Creatinine, Ser: 1.36 mg/dL — ABNORMAL HIGH (ref 0.44–1.00)
GFR, EST AFRICAN AMERICAN: 43 mL/min — AB (ref >60)
GFR, EST NON AFRICAN AMERICAN: 37 mL/min — AB (ref >60)
GLUCOSE: 84 mg/dL (ref 65–99)
POTASSIUM: 4.6 mmol/L (ref 3.5–5.1)
SODIUM: 145 mmol/L (ref 135–145)

## 2015-11-02 LAB — CBC
HCT: 20.6 % — ABNORMAL LOW (ref 36.0–46.0)
HEMOGLOBIN: 6.7 g/dL — AB (ref 12.0–15.0)
MCH: 32.4 pg (ref 26.0–34.0)
MCHC: 32.5 g/dL (ref 30.0–36.0)
MCV: 99.5 fL (ref 78.0–100.0)
Platelets: 178 10*3/uL (ref 150–400)
RBC: 2.07 MIL/uL — AB (ref 3.87–5.11)
RDW: 18.5 % — ABNORMAL HIGH (ref 11.5–15.5)
WBC: 23 10*3/uL — ABNORMAL HIGH (ref 4.0–10.5)

## 2015-11-02 LAB — PREPARE RBC (CROSSMATCH)

## 2015-11-02 LAB — URINE CULTURE: Culture: 1000 — AB

## 2015-11-02 LAB — LACTIC ACID, PLASMA
LACTIC ACID, VENOUS: 3 mmol/L — AB (ref 0.5–1.9)
LACTIC ACID, VENOUS: 1.9 mmol/L (ref 0.5–1.9)
Lactic Acid, Venous: 2.9 mmol/L (ref 0.5–1.9)
Lactic Acid, Venous: 2.2 mmol/L (ref 0.5–1.9)

## 2015-11-02 LAB — MAGNESIUM: MAGNESIUM: 2 mg/dL (ref 1.7–2.4)

## 2015-11-02 LAB — TSH: TSH: 1.037 u[IU]/mL (ref 0.350–4.500)

## 2015-11-02 SURGERY — EGD (ESOPHAGOGASTRODUODENOSCOPY)
Anesthesia: Moderate Sedation

## 2015-11-02 MED ORDER — FLUCONAZOLE 100 MG PO TABS
200.0000 mg | ORAL_TABLET | Freq: Every day | ORAL | Status: AC
Start: 2015-11-02 — End: 2015-11-06
  Administered 2015-11-02 – 2015-11-06 (×5): 200 mg via ORAL
  Filled 2015-11-02 (×5): qty 2

## 2015-11-02 MED ORDER — PANTOPRAZOLE SODIUM 40 MG PO TBEC
40.0000 mg | DELAYED_RELEASE_TABLET | Freq: Every day | ORAL | Status: DC
  Administered 2015-11-02 – 2015-11-09 (×8): 40 mg via ORAL
  Filled 2015-11-02 (×8): qty 1

## 2015-11-02 MED ORDER — METOPROLOL TARTRATE 5 MG/5ML IV SOLN
2.5000 mg | Freq: Two times a day (BID) | INTRAVENOUS | Status: DC | PRN
  Administered 2015-11-02: 2.5 mg via INTRAVENOUS

## 2015-11-02 MED ORDER — BUTAMBEN-TETRACAINE-BENZOCAINE 2-2-14 % EX AERO
INHALATION_SPRAY | CUTANEOUS | Status: DC | PRN
  Administered 2015-11-02: 2 via TOPICAL

## 2015-11-02 MED ORDER — FENTANYL CITRATE (PF) 100 MCG/2ML IJ SOLN
INTRAMUSCULAR | Status: AC
  Filled 2015-11-02: qty 2

## 2015-11-02 MED ORDER — DIPHENHYDRAMINE HCL 50 MG/ML IJ SOLN
INTRAMUSCULAR | Status: AC
  Filled 2015-11-02: qty 1

## 2015-11-02 MED ORDER — MIDAZOLAM HCL 10 MG/2ML IJ SOLN
INTRAMUSCULAR | Status: DC | PRN
  Administered 2015-11-02: 2 mg via INTRAVENOUS
  Administered 2015-11-02: 1 mg via INTRAVENOUS

## 2015-11-02 MED ORDER — FENTANYL CITRATE (PF) 100 MCG/2ML IJ SOLN
INTRAMUSCULAR | Status: DC | PRN
  Administered 2015-11-02: 25 ug via INTRAVENOUS

## 2015-11-02 MED ORDER — SODIUM CHLORIDE 0.9 % IV SOLN
Freq: Once | INTRAVENOUS | Status: AC
  Administered 2015-11-02: 07:00:00 via INTRAVENOUS

## 2015-11-02 MED ORDER — MIDAZOLAM HCL 5 MG/ML IJ SOLN
INTRAMUSCULAR | Status: AC
  Filled 2015-11-02: qty 2

## 2015-11-02 MED ORDER — SODIUM CHLORIDE 0.9 % IV BOLUS (SEPSIS)
500.0000 mL | Freq: Once | INTRAVENOUS | Status: AC
  Administered 2015-11-02: 500 mL via INTRAVENOUS

## 2015-11-02 NOTE — Consult Note (Addendum)
[facility] CONSULT Reason for consult: G.I. bleeding Referring Physician: [facility]. PCP: Dr Dorthy Cooler.  [name] is an 75 y.o. female.  HPI: 75 year old female who has been in the ALF. She has a history of insulin-dependent type II diabetes, mild dementia, and schizophrenia. She has had previous surgery for breast cancer and apparently did not need chemoradiation therapy back in May. Her main ongoing complaint is chronic back pain. According to her into her daughter she takes a large amount of ibuprofen. She admits to taking ibuprofen at least 2 to 4 times daily for chronic back pain which has been going on for several years. She developed hematemesis at the assisted living facility was brought to the emergency room and was slightly orthostatic with systolic blood pressure 86 in heart rate 132. She was also slightly hypoxic. Her stool was positive for blood was dark. Hemoglobin was 10.2 and drop to 6.7 with hydration. She was profoundly iron deficient. She denies ever having a history of ulcers or previous colonoscopy. She denies EGD. It's notable that she was on Nexium is outpatient for esophageal reflux and presumably she had been taking. She does complain of reflux and epigastric pain. She has no family history: cancer to the best of her knowledge  Past Medical History  Diagnosis Date  . Anemia, iron deficiency   . Anxiety   . Type II or unspecified type diabetes mellitus without mention of complication, not stated as uncontrolled   . GERD (gastroesophageal reflux disease)   . HTN (hypertension)   . Peripheral neuropathy ([place])   . Hyperlipidemia   . Allergic rhinitis   . PVD (peripheral vascular disease) (HCC)     Left subclavian 70% stenosis  . Dementia   . IBS (irritable bowel syndrome)   . Syncope     EP study 1996 w/atrial tachycardia  . Esophageal motility disorder   . Renal insufficiency   . Schizophrenia ([place])     paranoid schizophrenic  . Arthritis      back  . CAD (coronary artery disease)     CAD stent placed 2005  . Pneumonia 12-09-2014    adm with CAP requiring mechanical ventilation x 10d  . Cancer [facility])     Left breast invasive ductal carcinoma    Past Surgical History  Procedure Laterality Date  . Abdominal hysterectomy    . Ptca  1995  . Knee arthroplasty      Right   . Breast biopsy      Negative  . Joint replacement Bilateral   . Radioactive seed guided mastectomy with axillary sentinel lymph node biopsy Left 08/17/2015    Procedure: RADIOACTIVE SEED GUIDED LUMPECTOMY WITH AXILLARY SENTINEL LYMPH NODE BIOPSY;  Surgeon: Donnie Mesa, MD;  Location: [place];  Service: General;  Laterality: Left;  . Re-excision of breast lumpectomy Left 09/07/2015    Procedure: RE-EXCISION OF LEFT BREAST LUMPECTOMY;  Surgeon: Donnie Mesa, MD;  Location: [place];  Service: General;  Laterality: Left;    Family History  Problem Relation Age of Onset  . Schizophrenia Neg Hx   . Coronary artery disease Neg Hx   . Colon cancer Neg Hx   . Breast cancer Neg Hx   . Hypertension Other   . Diabetes Other     Social History:  reports that she has quit smoking. She has never used smokeless tobacco. She reports that she does not drink alcohol or use illicit drugs.  Allergies:  Allergies  Allergen Reactions  .  Codeine Phosphate Other (See Comments)    REACTION: unspecified  . Glimepiride Other (See Comments)    REACTION: unspecified  . Glyburide Other (See Comments)    REACTION: unspecified  . Levofloxacin Nausea Only    Made her weak and nauseated  . Shellfish Allergy Swelling  . Tramadol Other (See Comments)    unknown    Medications; Prior to Admission medications   Medication Sig Start Date End Date Taking? Authorizing Provider  acetaminophen (TYLENOL) 500 MG tablet Take 500-1,000 mg by mouth 3 (three) times daily as needed. For back pain    Yes Historical Provider, MD  amLODipine (NORVASC) 5  MG tablet Take 5 mg by mouth daily.   Yes Historical Provider, MD  aspirin 325 MG tablet Take 325 mg by mouth daily.     Yes Historical Provider, MD  atorvastatin (LIPITOR) 40 MG tablet Take 40 mg by mouth daily.   Yes Historical Provider, MD  bismuth subsalicylate (PEPTO BISMOL) 262 MG/15ML suspension Take 30 mLs by mouth every 6 (six) hours as needed for indigestion or diarrhea or loose stools.   Yes Historical Provider, MD  cloZAPine (CLOZARIL) 100 MG tablet Take 1 tablet (100 mg total) by mouth at bedtime. Patient taking differently: Take 150 mg by mouth at bedtime.  12/27/14  Yes Maryellen Pile, MD  donepezil (ARICEPT) 10 MG tablet Take 10 mg by mouth at bedtime.   Yes Historical Provider, MD  esomeprazole (NEXIUM) 40 MG capsule Take 40 mg by mouth daily.  06/23/15  Yes Historical Provider, MD  fluticasone (FLONASE) 50 MCG/ACT nasal spray Place 2 sprays into both nostrils daily as needed for allergies.  02/22/13  Yes Historical Provider, MD  furosemide (LASIX) 40 MG tablet Take 20 mg by mouth daily.    Yes Historical Provider, MD  gabapentin (NEURONTIN) 100 MG capsule Take 100 mg by mouth 4 (four) times daily. 09/04/15  Yes Historical Provider, MD  haloperidol (HALDOL) 5 MG tablet Take 5-10 mg by mouth 2 (two) times daily. Take 1 in the morning and 2 at night   Yes Historical Provider, MD  HYDROcodone-acetaminophen (NORCO/VICODIN) 5-325 MG tablet Take 1 tablet by mouth every 4 (four) hours as needed. 08/17/15  Yes Donnie Mesa, MD  insulin glargine (LANTUS SOLOSTAR) 100 UNIT/ML injection Inject 15 Units into the skin at bedtime. 03/21/11  Yes Neena Rhymes, MD  levocetirizine (XYZAL) 5 MG tablet Take 5 mg by mouth every evening.  03/05/13  Yes Historical Provider, MD  memantine (NAMENDA) 10 MG tablet Take 1 tablet (10 mg total) by mouth 2 (two) times daily. 10/09/12  Yes Neena Rhymes, MD  mirtazapine (REMERON) 30 MG tablet Take 30 mg by mouth at bedtime.   Yes Historical Provider, MD  Multiple  Vitamins-Minerals (MULTIVITAMIN,TX-MINERALS) tablet Take 1 tablet by mouth daily.     Yes Historical Provider, MD  omeprazole (PRILOSEC) 40 MG capsule Take 40 mg by mouth daily.   Yes Historical Provider, MD  pioglitazone (ACTOS) 30 MG tablet Take 1 tablet (30 mg total) by mouth daily. 05/21/12  Yes Neena Rhymes, MD  potassium chloride (KLOR-CON) 8 MEQ tablet Take 1 tablet (8 mEq total) by mouth daily. 04/28/12  Yes Neena Rhymes, MD  ramipril (ALTACE) 10 MG capsule Take 2 capsules (20 mg total) by mouth daily. 12/27/14  Yes Maryellen Pile, MD  traZODone (DESYREL) 100 MG tablet Take 100 mg by mouth at bedtime as needed. sleep 10/19/15  Yes Historical Provider, MD  valsartan (DIOVAN) 160  MG tablet Take 160 mg by mouth daily.   Yes Historical Provider, MD  ciprofloxacin (CIPRO) 500 MG tablet Take 500 mg by mouth 2 (two) times daily. Reported on 11/01/2015 10/20/15   Historical Provider, MD  sulfamethoxazole-trimethoprim (BACTRIM DS,SEPTRA DS) 800-160 MG tablet Take 1 tablet by mouth 2 (two) times daily. Reported on 11/01/2015 10/23/15   Historical Provider, MD   . cloZAPine  150 mg Oral QHS  . donepezil  10 mg Oral QHS  . insulin aspart  0-15 Units Subcutaneous TID WC  . memantine  10 mg Oral BID  . metoprolol  2.5 mg Intravenous Q8H  . [START ON 11/05/2015] pantoprazole  40 mg Intravenous Q12H  . piperacillin-tazobactam (ZOSYN)  IV  3.375 g Intravenous Q8H  . sodium chloride flush  3 mL Intravenous Q12H  . vancomycin  750 mg Intravenous Q24H   PRN Meds fluticasone, HYDROcodone-acetaminophen Results for orders placed or performed during the hospital encounter of 11/01/15 (from the past 48 hour(s))  CBC with Differential/Platelet     Status: Abnormal   Collection Time: 11/01/15 12:36 PM  Result Value Ref Range   WBC 25.6 (H) 4.0 - 10.5 K/uL   RBC 2.87 (L) 3.87 - 5.11 MIL/uL   Hemoglobin 9.6 (L) 12.0 - 15.0 g/dL   HCT 28.6 (L) 36.0 - 46.0 %   MCV 99.7 78.0 - 100.0 fL   MCH 33.4 26.0 - 34.0 pg    MCHC 33.6 30.0 - 36.0 g/dL   RDW 17.8 (H) 11.5 - 15.5 %   Platelets 292 150 - 400 K/uL   Neutrophils Relative % 88 %   Lymphocytes Relative 6 %   Monocytes Relative 6 %   Eosinophils Relative 0 %   Basophils Relative 0 %   Neutro Abs 22.6 (H) 1.7 - 7.7 K/uL   Lymphs Abs 1.5 0.7 - 4.0 K/uL   Monocytes Absolute 1.5 (H) 0.1 - 1.0 K/uL   Eosinophils Absolute 0.0 0.0 - 0.7 K/uL   Basophils Absolute 0.0 0.0 - 0.1 K/uL   Smear Review MORPHOLOGY UNREMARKABLE   Hepatic function panel     Status: Abnormal   Collection Time: 11/01/15 12:36 PM  Result Value Ref Range   Total Protein 6.0 (L) 6.5 - 8.1 g/dL   Albumin 3.0 (L) 3.5 - 5.0 g/dL   AST 13 (L) 15 - 41 U/L   ALT 9 (L) 14 - 54 U/L   Alkaline Phosphatase 55 38 - 126 U/L   Total Bilirubin 0.3 0.3 - 1.2 mg/dL   Bilirubin, Direct <0.1 (L) 0.1 - 0.5 mg/dL   Indirect Bilirubin NOT CALCULATED 0.3 - 0.9 mg/dL  Vitamin B12     Status: Abnormal   Collection Time: 11/01/15 12:36 PM  Result Value Ref Range   Vitamin B-12 1341 (H) 180 - 914 pg/mL    Comment: (NOTE) This assay is not validated for testing neonatal or myeloproliferative syndrome specimens for Vitamin B12 levels. Performed at [facility]   Iron and TIBC     Status: Abnormal   Collection Time: 11/01/15 12:36 PM  Result Value Ref Range   Iron 7 (L) 28 - 170 ug/dL   TIBC 202 (L) 250 - 450 ug/dL   Saturation Ratios 3 (L) 10.4 - 31.8 %   UIBC 195 ug/dL    Comment: Performed at [facility]  Ferritin     Status: None   Collection Time: 11/01/15 12:36 PM  Result Value Ref Range   Ferritin 96 11 -  307 ng/mL    Comment: Performed at [facility]  Reticulocytes     Status: Abnormal   Collection Time: 11/01/15 12:36 PM  Result Value Ref Range   Retic Ct Pct 3.1 0.4 - 3.1 %   RBC. 2.87 (L) 3.87 - 5.11 MIL/uL   Retic Count, Manual 89.0 19.0 - 186.0 K/uL  Type and screen     Status: None (Preliminary result)   Collection Time: 11/01/15 12:36 PM  Result  Value Ref Range   ABO/RH(D) AB POS    Antibody Screen NEG    Sample Expiration 11/04/2015    Unit Number [id number]    Blood Component Type RED CELLS,LR    Unit division 00    Status of Unit ISSUED    Transfusion Status OK TO TRANSFUSE    Crossmatch Result Compatible    Unit Number [id number]    Blood Component Type RED CELLS,LR    Unit division 00    Status of Unit ALLOCATED    Transfusion Status OK TO TRANSFUSE    Crossmatch Result Compatible   Folate     Status: None   Collection Time: 11/01/15 12:36 PM  Result Value Ref Range   Folate 16.6 >5.9 ng/mL    Comment: Performed at [facility]  ABO/Rh     Status: None   Collection Time: 11/01/15 12:36 PM  Result Value Ref Range   ABO/RH(D) AB POS   POC occult blood, ED Provider will collect     Status: Abnormal   Collection Time: 11/01/15 12:56 PM  Result Value Ref Range   Fecal Occult Bld POSITIVE (A) NEGATIVE  I-stat chem 8, ed     Status: Abnormal   Collection Time: 11/01/15  1:00 PM  Result Value Ref Range   Sodium 146 (H) 135 - 145 mmol/L   Potassium 3.9 3.5 - 5.1 mmol/L   Chloride 116 (H) 101 - 111 mmol/L   BUN 52 (H) 6 - 20 mg/dL   Creatinine, Ser 1.80 (H) 0.44 - 1.00 mg/dL   Glucose, Bld 157 (H) 65 - 99 mg/dL   Calcium, Ion 1.48 (H) 1.12 - 1.23 mmol/L   TCO2 23 0 - 100 mmol/L   Hemoglobin 10.2 (L) 12.0 - 15.0 g/dL   HCT 30.0 (L) 36.0 - 46.0 %  I-Stat CG4 Lactic Acid, ED     Status: Abnormal   Collection Time: 11/01/15  1:00 PM  Result Value Ref Range   Lactic Acid, Venous 2.30 (HH) 0.5 - 1.9 mmol/L   Comment NOTIFIED PHYSICIAN   I-stat troponin, ED     Status: None   Collection Time: 11/01/15  3:45 PM  Result Value Ref Range   Troponin i, poc 0.02 0.00 - 0.08 ng/mL   Comment 3            Comment: Due to the release kinetics of cTnI, a negative result within the first hours of the onset of symptoms does not rule out myocardial infarction with certainty. If myocardial infarction is still  suspected, repeat the test at appropriate intervals.   I-Stat CG4 Lactic Acid, ED  (not at  [facility])     Status: None   Collection Time: 11/01/15  3:47 PM  Result Value Ref Range   Lactic Acid, Venous 0.72 0.5 - 1.9 mmol/L  Urinalysis, Routine w reflex microscopic (not at [facility])     Status: Abnormal   Collection Time: 11/01/15  3:54 PM  Result Value Ref Range   Color, Urine AMBER (  A) YELLOW    Comment: BIOCHEMICALS MAY BE AFFECTED BY COLOR   APPearance CLOUDY (A) CLEAR   Specific Gravity, Urine 1.035 (H) 1.005 - 1.030   pH 5.5 5.0 - 8.0   Glucose, UA NEGATIVE NEGATIVE mg/dL   Hgb urine dipstick NEGATIVE NEGATIVE   Bilirubin Urine MODERATE (A) NEGATIVE   Ketones, ur NEGATIVE NEGATIVE mg/dL   Protein, ur NEGATIVE NEGATIVE mg/dL   Nitrite NEGATIVE NEGATIVE   Leukocytes, UA MODERATE (A) NEGATIVE  Urine microscopic-add on     Status: Abnormal   Collection Time: 11/01/15  3:54 PM  Result Value Ref Range   Squamous Epithelial / LPF TOO NUMEROUS TO COUNT (A) NONE SEEN   WBC, UA 6-30 0 - 5 WBC/hpf   RBC / HPF NONE SEEN 0 - 5 RBC/hpf   Bacteria, UA FEW (A) NONE SEEN  MRSA PCR Screening     Status: None   Collection Time: 11/01/15  5:27 PM  Result Value Ref Range   MRSA by PCR NEGATIVE NEGATIVE    Comment:        The GeneXpert MRSA Assay (FDA approved for NASAL specimens only), is one component of a comprehensive MRSA colonization surveillance program. It is not intended to diagnose MRSA infection nor to guide or monitor treatment for MRSA infections.   Hemoglobin and hematocrit, blood     Status: Abnormal   Collection Time: 11/01/15  6:54 PM  Result Value Ref Range   Hemoglobin 8.0 (L) 12.0 - 15.0 g/dL    Comment: DELTA CHECK NOTED REPEATED TO VERIFY    HCT 23.6 (L) 36.0 - 46.0 %  Procalcitonin     Status: None   Collection Time: 11/01/15  6:54 PM  Result Value Ref Range   Procalcitonin 17.46 ng/mL    Comment:        Interpretation: PCT >= 10 ng/mL: Important systemic  inflammatory response, almost exclusively due to severe bacterial sepsis or septic shock. (NOTE)         ICU PCT Algorithm               Non ICU PCT Algorithm    ----------------------------     ------------------------------         PCT < 0.25 ng/mL                 PCT < 0.1 ng/mL     Stopping of antibiotics            Stopping of antibiotics       strongly encouraged.               strongly encouraged.    ----------------------------     ------------------------------       PCT level decrease by               PCT < 0.25 ng/mL       >= 80% from peak PCT       OR PCT 0.25 - 0.5 ng/mL          Stopping of antibiotics                                             encouraged.     Stopping of antibiotics           encouraged.    ----------------------------     ------------------------------  PCT level decrease by              PCT >= 0.25 ng/mL       < 80% from peak PCT        AND PCT >= 0.5 ng/mL             Continuing antibiotics                                              encouraged.       Continuing antibiotics            encouraged.    ----------------------------     ------------------------------     PCT level increase compared          PCT > 0.5 ng/mL         with peak PCT AND          PCT >= 0.5 ng/mL             Escalation of antibiotics                                          strongly encouraged.      Escalation of antibiotics        strongly encouraged.   Lactic acid, plasma     Status: Abnormal   Collection Time: 11/01/15  7:03 PM  Result Value Ref Range   Lactic Acid, Venous 4.7 (HH) 0.5 - 1.9 mmol/L    Comment: CRITICAL RESULT CALLED TO, READ BACK BY AND VERIFIED WITH: MAY,B AT 2109 ON 11/01/15 BY MOSLEY,J   Magnesium     Status: None   Collection Time: 11/01/15  8:00 PM  Result Value Ref Range   Magnesium 2.0 1.7 - 2.4 mg/dL  Glucose, capillary     Status: Abnormal   Collection Time: 11/01/15  9:19 PM  Result Value Ref Range   Glucose-Capillary 176 (H) 65  - 99 mg/dL   Comment 1 Notify RN    Comment 2 Document in Chart   Protime-INR     Status: Abnormal   Collection Time: 11/01/15 11:18 PM  Result Value Ref Range   Prothrombin Time 16.0 (H) 11.6 - 15.2 seconds   INR 1.32 0.00 - 1.49  Lactic acid, plasma     Status: Abnormal   Collection Time: 11/01/15 11:20 PM  Result Value Ref Range   Lactic Acid, Venous 2.9 (HH) 0.5 - 1.9 mmol/L    Comment: CRITICAL RESULT CALLED TO, READ BACK BY AND VERIFIED WITH: B MAY RN @ 0008 ON 11/02/15 BY C DAVIS   Basic metabolic panel     Status: Abnormal   Collection Time: 11/02/15  2:43 AM  Result Value Ref Range   Sodium 145 135 - 145 mmol/L   Potassium 4.6 3.5 - 5.1 mmol/L   Chloride 120 (H) 101 - 111 mmol/L   CO2 15 (L) 22 - 32 mmol/L   Glucose, Bld 84 65 - 99 mg/dL   BUN 47 (H) 6 - 20 mg/dL   Creatinine, Ser 1.36 (H) 0.44 - 1.00 mg/dL   Calcium 9.5 8.9 - 10.3 mg/dL   GFR calc non Af Amer 37 (L) >60 mL/min   GFR calc Af Amer 43 (L) >60 mL/min    Comment: (  NOTE) The eGFR has been calculated using the CKD EPI equation. This calculation has not been validated in all clinical situations. eGFR's persistently <60 mL/min signify possible Chronic Kidney Disease.    Anion gap 10 5 - 15  Magnesium     Status: None   Collection Time: 11/02/15  2:43 AM  Result Value Ref Range   Magnesium 2.0 1.7 - 2.4 mg/dL  Lactic acid, plasma     Status: Abnormal   Collection Time: 11/02/15  2:43 AM  Result Value Ref Range   Lactic Acid, Venous 3.0 (HH) 0.5 - 1.9 mmol/L    Comment: CRITICAL RESULT CALLED TO, READ BACK BY AND VERIFIED WITH: B MAY RN @ 0335 ON 11/02/15 BY C DAVIS   Glucose, capillary     Status: None   Collection Time: 11/02/15  5:43 AM  Result Value Ref Range   Glucose-Capillary 99 65 - 99 mg/dL  CBC     Status: Abnormal   Collection Time: 11/02/15  5:52 AM  Result Value Ref Range   WBC 23.0 (H) 4.0 - 10.5 K/uL    Comment: [name] COUNT CONFIRMED ON SMEAR RARE NRBCs    RBC 2.07 (L) 3.87 -  5.11 MIL/uL   Hemoglobin 6.7 (LL) 12.0 - 15.0 g/dL    Comment: REPEATED TO VERIFY CRITICAL RESULT CALLED TO, READ BACK BY AND VERIFIED WITH: MAY RN AT 3704 ON 07.13.17 BY SHUEA    HCT 20.6 (L) 36.0 - 46.0 %   MCV 99.5 78.0 - 100.0 fL   MCH 32.4 26.0 - 34.0 pg   MCHC 32.5 30.0 - 36.0 g/dL   RDW 18.5 (H) 11.5 - 15.5 %   Platelets 178 150 - 400 K/uL    Comment: PLATELET COUNT CONFIRMED BY SMEAR  TSH     Status: None   Collection Time: 11/02/15  5:52 AM  Result Value Ref Range   TSH 1.037 0.350 - 4.500 uIU/mL  Prepare RBC     Status: None   Collection Time: 11/02/15  7:30 AM  Result Value Ref Range   Order Confirmation ORDER PROCESSED BY BLOOD BANK   Glucose, capillary     Status: None   Collection Time: 11/02/15  7:32 AM  Result Value Ref Range   Glucose-Capillary 91 65 - 99 mg/dL    Ct Head Wo Contrast  11/01/2015  CLINICAL DATA:  75 year old female with confusion EXAM: CT HEAD WITHOUT CONTRAST TECHNIQUE: Contiguous axial images were obtained from the base of the skull through the vertex without intravenous contrast. COMPARISON:  CT dated 12/26/2014 FINDINGS: There is mild age-related atrophy and chronic microvascular ischemic changes. There is no acute intracranial hemorrhage. No mass effect or midline shift noted. The visualized paranasal sinuses and mastoid air cells are clear. The calvarium is intact. IMPRESSION: No acute intracranial hemorrhage. Mild age-related atrophy and chronic microvascular ischemic disease. If symptoms persist and there are no contraindications, MRI may provide better evaluation if clinically indicated Electronically Signed   By: Anner Crete M.D.   On: 11/01/2015 21:51   Dg Abd Acute W/chest  11/01/2015  CLINICAL DATA:  Peptic ulcer disease, concern for GI bleed, check for free air. Abdominal pain. EXAM: DG ABDOMEN ACUTE W/ 1V CHEST COMPARISON:  08/22/2015 FINDINGS: Patchy airspace disease in the right upper lobe and right lower lobe, possible pneumonia.  This was not present on the recent study. No effusion. Left lung clear. Nonobstructive bowel gas pattern.  No free air. Surgical clips in the gallbladder fossa. Lumbar levoscoliosis.  No acute skeletal abnormality. No urinary tract calculi. IMPRESSION: Infiltrate in the right lower lobe and right upper lobe, suspicious for pneumonia Normal bowel gas pattern.  No free air. Electronically Signed   By: Franchot Gallo M.D.   On: 11/01/2015 13:29               Blood pressure 127/44, pulse 96, temperature 98.4 F (36.9 C), temperature source Oral, resp. rate 22, height 5' (1.524 m), weight 156 lb (70.761 kg), SpO2 100 %.  Physical exam:   General-- very sleepy and drowsy African-American female who is receiving a blood transfusion ENT-- nonicteric no signs of bleeding in the mouth Neck-- no lymphadenopathy Heart-- regular rate and rhythm without murmurs are gallops Lungs-- clear Abdomen-- nondistended and soft with good bowel sounds with mild upper tenderness Psych-- alert and seems to answer questions appropriately but is somewhat drowsy   Assessment: 1. G.I. bleed. History of melena and questionable history of hematemesis. She states that she had taken some Pepto-Bismol but doesn't remember when this was. Her hemoglobin dropped considerably in my suspicion is that she is bleeding from in-state induced ulcer. Her BUN and creatinine are both elevated. She was hypotensive. 2. Breast cancer. She status post lumpectomy with radioactive seed implant 3. History of mild dementia 4. GERD 5. CAD with stent placement 6. History of schizophrenia. Lives in assisted living facility 7. Type II diabetes Plan: 1. Will proceed with bedside EGD today. Have discussed this with patient and with daughter [name].   Judiann Celia JR,Trenace Coughlin L 11/02/2015, 9:50 AM   This note was created using voice recognition software and minor errors may Have occurred unintentionally. Pager: [phone] If no answer or  after hours call [phone]

## 2015-11-02 NOTE — Evaluation (Signed)
SLP Cancellation Note  Patient Details Name: [name] MRN: [id number] DOB: [birth year]   Cancelled treatment:       Reason Eval/Treat Not Completed: Other (comment) (pt npo for procedure, has h/o oropharyngeal dysphgia also per chart review, will reattempt at a later time) Luanna Salk, [place] [facility] SLP [phone]

## 2015-11-02 NOTE — Interval H&P Note (Signed)
History and Physical Interval Note:  11/02/2015 12:15 PM  [name]  has presented today for surgery, with the diagnosis of GI Bleed  The various methods of treatment have been discussed with the patient and family. After consideration of risks, benefits and other options for treatment, the patient has consented to  Procedure(s): ESOPHAGOGASTRODUODENOSCOPY (EGD) (N/A) as a surgical intervention .  The patient's history has been reviewed, patient examined, no change in status, stable for surgery.  I have reviewed the patient's chart and labs.  Questions were answered to the patient's satisfaction.     Wasif Simonich JR,Kadie Balestrieri L

## 2015-11-02 NOTE — Progress Notes (Signed)
PROGRESS NOTE                                                                                                                                                                                                             Patient Demographics:    [name], is a 75 y.o. female, DOB - Sep 04, 1940, [id number]  Admit date - 11/01/2015   Admitting Physician Florencia Reasons, MD  Outpatient Primary MD for the patient is [name], MD  LOS - 1  Outpatient Specialists: none  Chief Complaint  Patient presents with  . Weakness  . GI Bleeding  . Hypotension       Brief Narrative   75 year old female with history of mild dementia, schizophrenia, insulin-dependent type 2 diabetes mellitus, CAD, hypertension, history of breast cysts status post lumpectomy in 08/2015 who is a resident of a left was sent to [facility] ED with several days of coffee-ground emesis and dark stools. As per EMS patient was orthostatic with blood pressure of 86/48 mmHg and heart rate of 132. She was also hypoxic with O2 sat of 87% on room air. FOBT was positive. Patient was septic with leukocytosis and lactic acidosis, acute kidney injury and abdominal x-ray with chest showing right upper and lower lobe infiltrate. Patient was admitted to stepdown unit. Subsequent hemoglobin dropped to 6.7 and ordered for 2 units PRBC.   Subjective:   Patient reports feeling weak. Complains of reflux symptoms and epigastric discomfort. No further hematemesis or melena. Shortness of breath better.   Assessment  & Plan :   Principal problem Acute upper GI bleed Continue step down monitoring. Patient reports taking NSAIDs regularly for her back pain. Also is an active smoker. Hold aspirin. 2 units PRBC ordered. Monitor H&H closely. Continue Protonix drip. [facility] consulted and scheduled for endoscopy today. Counseled on smoking cessation and avoiding NSAIDs.  Septic shock  with acute hypoxic respiratory failure Suspect secondary to multilobar pneumonia and acute GI bleed. Patient maintaining O2 sat on 2 L. Blood pressure improved. Still has significant leukocytosis. Follow lactic acid level. Continue maintenance fluid. Empiric vancomycin and Zosyn. Follow cultures. Low suspicion for PE as symptoms (hypoxemia, tachycardia and hypotension) improved with treatment for sepsis. Patient denies any chest pain symptoms or recent travel.  Insulin-dependent type 2 diabetes mellitus Holding Lantus. Monitor on sliding scale coverage. Follow A1c.  Prolonged QTC (502) Hold Haldol. Normal K and magnesium. Repeat EKG.  Acute kidney injury Suspect prerenal due to septic shock. Improving.  Metabolic acidosis  secondary to dehydration/ sepsis with lactic acidosis. Monitor closely.  Code Status : Full code  Family Communication  : None at bedside  Disposition Plan  : Continue step down monitoring. Home once improved  Barriers For Discharge : Active symptoms  Consults  :  [facility]  Procedures  : EGD  DVT Prophylaxis  : SCDs  Lab Results  Component Value Date   PLT 178 11/02/2015    Antibiotics  :    Anti-infectives    Start     Dose/Rate Route Frequency Ordered Stop   11/02/15 1400  vancomycin (VANCOCIN) IVPB 750 mg/150 ml premix     750 mg 150 mL/hr over 60 Minutes Intravenous Every 24 hours 11/01/15 1455     11/01/15 2200  piperacillin-tazobactam (ZOSYN) IVPB 3.375 g     3.375 g 12.5 mL/hr over 240 Minutes Intravenous Every 8 hours 11/01/15 1455     11/01/15 1415  piperacillin-tazobactam (ZOSYN) IVPB 3.375 g     3.375 g 100 mL/hr over 30 Minutes Intravenous  Once 11/01/15 1409 11/01/15 1546   11/01/15 1415  vancomycin (VANCOCIN) IVPB 1000 mg/200 mL premix     1,000 mg 200 mL/hr over 60 Minutes Intravenous  Once 11/01/15 1409 11/01/15 1626        Objective:   Filed Vitals:   11/02/15 1100 11/02/15 1137 11/02/15 1152 11/02/15 1210  BP: 172/58  160/58 156/53 158/54  Pulse: 99 64 28 95  Temp: 98.4 F (36.9 C) 98.3 F (36.8 C) 98.4 F (36.9 C)   TempSrc: Oral Oral Oral   Resp: 17 27 15 27   Height:    5' (1.524 m)  Weight:    70.761 kg (156 lb)  SpO2: 100% 92% 75% 98%    Wt Readings from Last 3 Encounters:  11/02/15 70.761 kg (156 lb)  10/04/15 71.079 kg (156 lb 11.2 oz)  09/07/15 71.668 kg (158 lb)     Intake/Output Summary (Last 24 hours) at 11/02/15 1242 Last data filed at 11/02/15 1152  Gross per 24 hour  Intake 6431.25 ml  Output    315 ml  Net 6116.25 ml     Physical Exam  [id number] fatigued, HEENT: Pallor present, moist mucosa, supple neck Chest: Coarse breath sounds over right lung, scattered rhonchi CVS: N S1&S2, no murmurs, rubs or gallop GI: soft, NT, ND, BS+ Musculoskeletal: warm, no edema CNS: Alert and oriented    Data Review:    CBC  Recent Labs Lab 11/01/15 1236 11/01/15 1300 11/01/15 1854 11/02/15 0552  WBC 25.6*  --   --  23.0*  HGB 9.6* 10.2* 8.0* 6.7*  HCT 28.6* 30.0* 23.6* 20.6*  PLT 292  --   --  178  MCV 99.7  --   --  99.5  MCH 33.4  --   --  32.4  MCHC 33.6  --   --  32.5  RDW 17.8*  --   --  18.5*  LYMPHSABS 1.5  --   --   --   MONOABS 1.5*  --   --   --   EOSABS 0.0  --   --   --   BASOSABS 0.0  --   --   --     Chemistries   Recent Labs Lab 11/01/15 1236 11/01/15 1300 11/01/15 2000 11/02/15 0243  NA  --  146*  --  145  K  --  3.9  --  4.6  CL  --  116*  --  120*  CO2  --   --   --  15*  GLUCOSE  --  157*  --  84  BUN  --  52*  --  47*  CREATININE  --  1.80*  --  1.36*  CALCIUM  --   --   --  9.5  MG  --   --  2.0 2.0  AST 13*  --   --   --   ALT 9*  --   --   --   ALKPHOS 55  --   --   --   BILITOT 0.3  --   --   --    ------------------------------------------------------------------------------------------------------------------ No results for input(s): CHOL, HDL, LDLCALC, TRIG, CHOLHDL, LDLDIRECT in the last 72 hours.  Lab Results    Component Value Date   HGBA1C 6.8* 12/23/2014   ------------------------------------------------------------------------------------------------------------------  Recent Labs  11/02/15 0552  TSH 1.037   ------------------------------------------------------------------------------------------------------------------  Recent Labs  11/01/15 1236  VITAMINB12 1341*  FOLATE 16.6  FERRITIN 96  TIBC 202*  IRON 7*  RETICCTPCT 3.1    Coagulation profile  Recent Labs Lab 11/01/15 2318  INR 1.32    No results for input(s): DDIMER in the last 72 hours.  Cardiac Enzymes No results for input(s): CKMB, TROPONINI, MYOGLOBIN in the last 168 hours.  Invalid input(s): CK ------------------------------------------------------------------------------------------------------------------    Component Value Date/Time   BNP 143.0* 12/10/2014 2311    Inpatient Medications  Scheduled Meds: . cloZAPine  150 mg Oral QHS  . donepezil  10 mg Oral QHS  . insulin aspart  0-15 Units Subcutaneous TID WC  . memantine  10 mg Oral BID  . metoprolol  2.5 mg Intravenous Q8H  . [START ON 11/05/2015] pantoprazole  40 mg Intravenous Q12H  . piperacillin-tazobactam (ZOSYN)  IV  3.375 g Intravenous Q8H  . sodium chloride flush  3 mL Intravenous Q12H  . vancomycin  750 mg Intravenous Q24H   Continuous Infusions: . sodium chloride 100 mL/hr at 11/02/15 0741  . pantoprozole (PROTONIX) infusion 8 mg/hr (11/02/15 0614)   PRN Meds:.fluticasone, HYDROcodone-acetaminophen  Micro Results Recent Results (from the past 240 hour(s))  MRSA PCR Screening     Status: None   Collection Time: 11/01/15  5:27 PM  Result Value Ref Range Status   MRSA by PCR NEGATIVE NEGATIVE Final    Comment:        The GeneXpert MRSA Assay (FDA approved for NASAL specimens only), is one component of a comprehensive MRSA colonization surveillance program. It is not intended to diagnose MRSA infection nor to guide  or monitor treatment for MRSA infections.     Radiology Reports Ct Head Wo Contrast  11/01/2015  CLINICAL DATA:  75 year old female with confusion EXAM: CT HEAD WITHOUT CONTRAST TECHNIQUE: Contiguous axial images were obtained from the base of the skull through the vertex without intravenous contrast. COMPARISON:  CT dated 12/26/2014 FINDINGS: There is mild age-related atrophy and chronic microvascular ischemic changes. There is no acute intracranial hemorrhage. No mass effect or midline shift noted. The visualized paranasal sinuses and mastoid air cells are clear. The calvarium is intact. IMPRESSION: No acute intracranial hemorrhage. Mild age-related atrophy and chronic microvascular ischemic disease. If symptoms persist and there are no contraindications, MRI may provide better evaluation if clinically indicated Electronically Signed   By: Anner Crete M.D.   On:  11/01/2015 21:51   Dg Abd Acute W/chest  11/01/2015  CLINICAL DATA:  Peptic ulcer disease, concern for GI bleed, check for free air. Abdominal pain. EXAM: DG ABDOMEN ACUTE W/ 1V CHEST COMPARISON:  08/22/2015 FINDINGS: Patchy airspace disease in the right upper lobe and right lower lobe, possible pneumonia. This was not present on the recent study. No effusion. Left lung clear. Nonobstructive bowel gas pattern.  No free air. Surgical clips in the gallbladder fossa. Lumbar levoscoliosis. No acute skeletal abnormality. No urinary tract calculi. IMPRESSION: Infiltrate in the right lower lobe and right upper lobe, suspicious for pneumonia Normal bowel gas pattern.  No free air. Electronically Signed   By: Franchot Gallo M.D.   On: 11/01/2015 13:29    Time Spent in minutes  35   Louellen Molder M.D on 11/02/2015 at 12:42 PM  Between 7am to 7pm - Pager - [phone]  After 7pm go to [email or web address] - password [facility]  [facility] -  Office  [phone]

## 2015-11-02 NOTE — H&P (View-Only) (Signed)
[facility] CONSULT Reason for consult: G.I. bleeding Referring Physician: [facility]. PCP: Dr Dorthy Cooler.  [name] is an 75 y.o. female.  HPI: 75 year old female who has been in the ALF. She has a history of insulin-dependent type II diabetes, mild dementia, and schizophrenia. She has had previous surgery for breast cancer and apparently did not need chemoradiation therapy back in May. Her main ongoing complaint is chronic back pain. According to her into her daughter she takes a large amount of ibuprofen. She admits to taking ibuprofen at least 2 to 4 times daily for chronic back pain which has been going on for several years. She developed hematemesis at the assisted living facility was brought to the emergency room and was slightly orthostatic with systolic blood pressure 86 in heart rate 132. She was also slightly hypoxic. Her stool was positive for blood was dark. Hemoglobin was 10.2 and drop to 6.7 with hydration. She was profoundly iron deficient. She denies ever having a history of ulcers or previous colonoscopy. She denies EGD. It's notable that she was on Nexium is outpatient for esophageal reflux and presumably she had been taking. She does complain of reflux and epigastric pain. She has no family history: cancer to the best of her knowledge  Past Medical History  Diagnosis Date  . Anemia, iron deficiency   . Anxiety   . Type II or unspecified type diabetes mellitus without mention of complication, not stated as uncontrolled   . GERD (gastroesophageal reflux disease)   . HTN (hypertension)   . Peripheral neuropathy ([place])   . Hyperlipidemia   . Allergic rhinitis   . PVD (peripheral vascular disease) (HCC)     Left subclavian 70% stenosis  . Dementia   . IBS (irritable bowel syndrome)   . Syncope     EP study 1996 w/atrial tachycardia  . Esophageal motility disorder   . Renal insufficiency   . Schizophrenia ([place])     paranoid schizophrenic  . Arthritis      back  . CAD (coronary artery disease)     CAD stent placed 2005  . Pneumonia 12-09-2014    adm with CAP requiring mechanical ventilation x 10d  . Cancer [facility])     Left breast invasive ductal carcinoma    Past Surgical History  Procedure Laterality Date  . Abdominal hysterectomy    . Ptca  1995  . Knee arthroplasty      Right   . Breast biopsy      Negative  . Joint replacement Bilateral   . Radioactive seed guided mastectomy with axillary sentinel lymph node biopsy Left 08/17/2015    Procedure: RADIOACTIVE SEED GUIDED LUMPECTOMY WITH AXILLARY SENTINEL LYMPH NODE BIOPSY;  Surgeon: Donnie Mesa, MD;  Location: [place];  Service: General;  Laterality: Left;  . Re-excision of breast lumpectomy Left 09/07/2015    Procedure: RE-EXCISION OF LEFT BREAST LUMPECTOMY;  Surgeon: Donnie Mesa, MD;  Location: [place];  Service: General;  Laterality: Left;    Family History  Problem Relation Age of Onset  . Schizophrenia Neg Hx   . Coronary artery disease Neg Hx   . Colon cancer Neg Hx   . Breast cancer Neg Hx   . Hypertension Other   . Diabetes Other     Social History:  reports that she has quit smoking. She has never used smokeless tobacco. She reports that she does not drink alcohol or use illicit drugs.  Allergies:  Allergies  Allergen Reactions  .  Codeine Phosphate Other (See Comments)    REACTION: unspecified  . Glimepiride Other (See Comments)    REACTION: unspecified  . Glyburide Other (See Comments)    REACTION: unspecified  . Levofloxacin Nausea Only    Made her weak and nauseated  . Shellfish Allergy Swelling  . Tramadol Other (See Comments)    unknown    Medications; Prior to Admission medications   Medication Sig Start Date End Date Taking? Authorizing Provider  acetaminophen (TYLENOL) 500 MG tablet Take 500-1,000 mg by mouth 3 (three) times daily as needed. For back pain    Yes Historical Provider, MD  amLODipine (NORVASC) 5  MG tablet Take 5 mg by mouth daily.   Yes Historical Provider, MD  aspirin 325 MG tablet Take 325 mg by mouth daily.     Yes Historical Provider, MD  atorvastatin (LIPITOR) 40 MG tablet Take 40 mg by mouth daily.   Yes Historical Provider, MD  bismuth subsalicylate (PEPTO BISMOL) 262 MG/15ML suspension Take 30 mLs by mouth every 6 (six) hours as needed for indigestion or diarrhea or loose stools.   Yes Historical Provider, MD  cloZAPine (CLOZARIL) 100 MG tablet Take 1 tablet (100 mg total) by mouth at bedtime. Patient taking differently: Take 150 mg by mouth at bedtime.  12/27/14  Yes Maryellen Pile, MD  donepezil (ARICEPT) 10 MG tablet Take 10 mg by mouth at bedtime.   Yes Historical Provider, MD  esomeprazole (NEXIUM) 40 MG capsule Take 40 mg by mouth daily.  06/23/15  Yes Historical Provider, MD  fluticasone (FLONASE) 50 MCG/ACT nasal spray Place 2 sprays into both nostrils daily as needed for allergies.  02/22/13  Yes Historical Provider, MD  furosemide (LASIX) 40 MG tablet Take 20 mg by mouth daily.    Yes Historical Provider, MD  gabapentin (NEURONTIN) 100 MG capsule Take 100 mg by mouth 4 (four) times daily. 09/04/15  Yes Historical Provider, MD  haloperidol (HALDOL) 5 MG tablet Take 5-10 mg by mouth 2 (two) times daily. Take 1 in the morning and 2 at night   Yes Historical Provider, MD  HYDROcodone-acetaminophen (NORCO/VICODIN) 5-325 MG tablet Take 1 tablet by mouth every 4 (four) hours as needed. 08/17/15  Yes Donnie Mesa, MD  insulin glargine (LANTUS SOLOSTAR) 100 UNIT/ML injection Inject 15 Units into the skin at bedtime. 03/21/11  Yes Neena Rhymes, MD  levocetirizine (XYZAL) 5 MG tablet Take 5 mg by mouth every evening.  03/05/13  Yes Historical Provider, MD  memantine (NAMENDA) 10 MG tablet Take 1 tablet (10 mg total) by mouth 2 (two) times daily. 10/09/12  Yes Neena Rhymes, MD  mirtazapine (REMERON) 30 MG tablet Take 30 mg by mouth at bedtime.   Yes Historical Provider, MD  Multiple  Vitamins-Minerals (MULTIVITAMIN,TX-MINERALS) tablet Take 1 tablet by mouth daily.     Yes Historical Provider, MD  omeprazole (PRILOSEC) 40 MG capsule Take 40 mg by mouth daily.   Yes Historical Provider, MD  pioglitazone (ACTOS) 30 MG tablet Take 1 tablet (30 mg total) by mouth daily. 05/21/12  Yes Neena Rhymes, MD  potassium chloride (KLOR-CON) 8 MEQ tablet Take 1 tablet (8 mEq total) by mouth daily. 04/28/12  Yes Neena Rhymes, MD  ramipril (ALTACE) 10 MG capsule Take 2 capsules (20 mg total) by mouth daily. 12/27/14  Yes Maryellen Pile, MD  traZODone (DESYREL) 100 MG tablet Take 100 mg by mouth at bedtime as needed. sleep 10/19/15  Yes Historical Provider, MD  valsartan (DIOVAN) 160  MG tablet Take 160 mg by mouth daily.   Yes Historical Provider, MD  ciprofloxacin (CIPRO) 500 MG tablet Take 500 mg by mouth 2 (two) times daily. Reported on 11/01/2015 10/20/15   Historical Provider, MD  sulfamethoxazole-trimethoprim (BACTRIM DS,SEPTRA DS) 800-160 MG tablet Take 1 tablet by mouth 2 (two) times daily. Reported on 11/01/2015 10/23/15   Historical Provider, MD   . cloZAPine  150 mg Oral QHS  . donepezil  10 mg Oral QHS  . insulin aspart  0-15 Units Subcutaneous TID WC  . memantine  10 mg Oral BID  . metoprolol  2.5 mg Intravenous Q8H  . [START ON 11/05/2015] pantoprazole  40 mg Intravenous Q12H  . piperacillin-tazobactam (ZOSYN)  IV  3.375 g Intravenous Q8H  . sodium chloride flush  3 mL Intravenous Q12H  . vancomycin  750 mg Intravenous Q24H   PRN Meds fluticasone, HYDROcodone-acetaminophen Results for orders placed or performed during the hospital encounter of 11/01/15 (from the past 48 hour(s))  CBC with Differential/Platelet     Status: Abnormal   Collection Time: 11/01/15 12:36 PM  Result Value Ref Range   WBC 25.6 (H) 4.0 - 10.5 K/uL   RBC 2.87 (L) 3.87 - 5.11 MIL/uL   Hemoglobin 9.6 (L) 12.0 - 15.0 g/dL   HCT 28.6 (L) 36.0 - 46.0 %   MCV 99.7 78.0 - 100.0 fL   MCH 33.4 26.0 - 34.0 pg    MCHC 33.6 30.0 - 36.0 g/dL   RDW 17.8 (H) 11.5 - 15.5 %   Platelets 292 150 - 400 K/uL   Neutrophils Relative % 88 %   Lymphocytes Relative 6 %   Monocytes Relative 6 %   Eosinophils Relative 0 %   Basophils Relative 0 %   Neutro Abs 22.6 (H) 1.7 - 7.7 K/uL   Lymphs Abs 1.5 0.7 - 4.0 K/uL   Monocytes Absolute 1.5 (H) 0.1 - 1.0 K/uL   Eosinophils Absolute 0.0 0.0 - 0.7 K/uL   Basophils Absolute 0.0 0.0 - 0.1 K/uL   Smear Review MORPHOLOGY UNREMARKABLE   Hepatic function panel     Status: Abnormal   Collection Time: 11/01/15 12:36 PM  Result Value Ref Range   Total Protein 6.0 (L) 6.5 - 8.1 g/dL   Albumin 3.0 (L) 3.5 - 5.0 g/dL   AST 13 (L) 15 - 41 U/L   ALT 9 (L) 14 - 54 U/L   Alkaline Phosphatase 55 38 - 126 U/L   Total Bilirubin 0.3 0.3 - 1.2 mg/dL   Bilirubin, Direct <0.1 (L) 0.1 - 0.5 mg/dL   Indirect Bilirubin NOT CALCULATED 0.3 - 0.9 mg/dL  Vitamin B12     Status: Abnormal   Collection Time: 11/01/15 12:36 PM  Result Value Ref Range   Vitamin B-12 1341 (H) 180 - 914 pg/mL    Comment: (NOTE) This assay is not validated for testing neonatal or myeloproliferative syndrome specimens for Vitamin B12 levels. Performed at [facility]   Iron and TIBC     Status: Abnormal   Collection Time: 11/01/15 12:36 PM  Result Value Ref Range   Iron 7 (L) 28 - 170 ug/dL   TIBC 202 (L) 250 - 450 ug/dL   Saturation Ratios 3 (L) 10.4 - 31.8 %   UIBC 195 ug/dL    Comment: Performed at [facility]  Ferritin     Status: None   Collection Time: 11/01/15 12:36 PM  Result Value Ref Range   Ferritin 96 11 -  307 ng/mL    Comment: Performed at [facility]  Reticulocytes     Status: Abnormal   Collection Time: 11/01/15 12:36 PM  Result Value Ref Range   Retic Ct Pct 3.1 0.4 - 3.1 %   RBC. 2.87 (L) 3.87 - 5.11 MIL/uL   Retic Count, Manual 89.0 19.0 - 186.0 K/uL  Type and screen     Status: None (Preliminary result)   Collection Time: 11/01/15 12:36 PM  Result  Value Ref Range   ABO/RH(D) AB POS    Antibody Screen NEG    Sample Expiration 11/04/2015    Unit Number [id number]    Blood Component Type RED CELLS,LR    Unit division 00    Status of Unit ISSUED    Transfusion Status OK TO TRANSFUSE    Crossmatch Result Compatible    Unit Number [id number]    Blood Component Type RED CELLS,LR    Unit division 00    Status of Unit ALLOCATED    Transfusion Status OK TO TRANSFUSE    Crossmatch Result Compatible   Folate     Status: None   Collection Time: 11/01/15 12:36 PM  Result Value Ref Range   Folate 16.6 >5.9 ng/mL    Comment: Performed at [facility]  ABO/Rh     Status: None   Collection Time: 11/01/15 12:36 PM  Result Value Ref Range   ABO/RH(D) AB POS   POC occult blood, ED Provider will collect     Status: Abnormal   Collection Time: 11/01/15 12:56 PM  Result Value Ref Range   Fecal Occult Bld POSITIVE (A) NEGATIVE  I-stat chem 8, ed     Status: Abnormal   Collection Time: 11/01/15  1:00 PM  Result Value Ref Range   Sodium 146 (H) 135 - 145 mmol/L   Potassium 3.9 3.5 - 5.1 mmol/L   Chloride 116 (H) 101 - 111 mmol/L   BUN 52 (H) 6 - 20 mg/dL   Creatinine, Ser 1.80 (H) 0.44 - 1.00 mg/dL   Glucose, Bld 157 (H) 65 - 99 mg/dL   Calcium, Ion 1.48 (H) 1.12 - 1.23 mmol/L   TCO2 23 0 - 100 mmol/L   Hemoglobin 10.2 (L) 12.0 - 15.0 g/dL   HCT 30.0 (L) 36.0 - 46.0 %  I-Stat CG4 Lactic Acid, ED     Status: Abnormal   Collection Time: 11/01/15  1:00 PM  Result Value Ref Range   Lactic Acid, Venous 2.30 (HH) 0.5 - 1.9 mmol/L   Comment NOTIFIED PHYSICIAN   I-stat troponin, ED     Status: None   Collection Time: 11/01/15  3:45 PM  Result Value Ref Range   Troponin i, poc 0.02 0.00 - 0.08 ng/mL   Comment 3            Comment: Due to the release kinetics of cTnI, a negative result within the first hours of the onset of symptoms does not rule out myocardial infarction with certainty. If myocardial infarction is still  suspected, repeat the test at appropriate intervals.   I-Stat CG4 Lactic Acid, ED  (not at  [facility])     Status: None   Collection Time: 11/01/15  3:47 PM  Result Value Ref Range   Lactic Acid, Venous 0.72 0.5 - 1.9 mmol/L  Urinalysis, Routine w reflex microscopic (not at [facility])     Status: Abnormal   Collection Time: 11/01/15  3:54 PM  Result Value Ref Range   Color, Urine AMBER (  A) YELLOW    Comment: BIOCHEMICALS MAY BE AFFECTED BY COLOR   APPearance CLOUDY (A) CLEAR   Specific Gravity, Urine 1.035 (H) 1.005 - 1.030   pH 5.5 5.0 - 8.0   Glucose, UA NEGATIVE NEGATIVE mg/dL   Hgb urine dipstick NEGATIVE NEGATIVE   Bilirubin Urine MODERATE (A) NEGATIVE   Ketones, ur NEGATIVE NEGATIVE mg/dL   Protein, ur NEGATIVE NEGATIVE mg/dL   Nitrite NEGATIVE NEGATIVE   Leukocytes, UA MODERATE (A) NEGATIVE  Urine microscopic-add on     Status: Abnormal   Collection Time: 11/01/15  3:54 PM  Result Value Ref Range   Squamous Epithelial / LPF TOO NUMEROUS TO COUNT (A) NONE SEEN   WBC, UA 6-30 0 - 5 WBC/hpf   RBC / HPF NONE SEEN 0 - 5 RBC/hpf   Bacteria, UA FEW (A) NONE SEEN  MRSA PCR Screening     Status: None   Collection Time: 11/01/15  5:27 PM  Result Value Ref Range   MRSA by PCR NEGATIVE NEGATIVE    Comment:        The GeneXpert MRSA Assay (FDA approved for NASAL specimens only), is one component of a comprehensive MRSA colonization surveillance program. It is not intended to diagnose MRSA infection nor to guide or monitor treatment for MRSA infections.   Hemoglobin and hematocrit, blood     Status: Abnormal   Collection Time: 11/01/15  6:54 PM  Result Value Ref Range   Hemoglobin 8.0 (L) 12.0 - 15.0 g/dL    Comment: DELTA CHECK NOTED REPEATED TO VERIFY    HCT 23.6 (L) 36.0 - 46.0 %  Procalcitonin     Status: None   Collection Time: 11/01/15  6:54 PM  Result Value Ref Range   Procalcitonin 17.46 ng/mL    Comment:        Interpretation: PCT >= 10 ng/mL: Important systemic  inflammatory response, almost exclusively due to severe bacterial sepsis or septic shock. (NOTE)         ICU PCT Algorithm               Non ICU PCT Algorithm    ----------------------------     ------------------------------         PCT < 0.25 ng/mL                 PCT < 0.1 ng/mL     Stopping of antibiotics            Stopping of antibiotics       strongly encouraged.               strongly encouraged.    ----------------------------     ------------------------------       PCT level decrease by               PCT < 0.25 ng/mL       >= 80% from peak PCT       OR PCT 0.25 - 0.5 ng/mL          Stopping of antibiotics                                             encouraged.     Stopping of antibiotics           encouraged.    ----------------------------     ------------------------------  PCT level decrease by              PCT >= 0.25 ng/mL       < 80% from peak PCT        AND PCT >= 0.5 ng/mL             Continuing antibiotics                                              encouraged.       Continuing antibiotics            encouraged.    ----------------------------     ------------------------------     PCT level increase compared          PCT > 0.5 ng/mL         with peak PCT AND          PCT >= 0.5 ng/mL             Escalation of antibiotics                                          strongly encouraged.      Escalation of antibiotics        strongly encouraged.   Lactic acid, plasma     Status: Abnormal   Collection Time: 11/01/15  7:03 PM  Result Value Ref Range   Lactic Acid, Venous 4.7 (HH) 0.5 - 1.9 mmol/L    Comment: CRITICAL RESULT CALLED TO, READ BACK BY AND VERIFIED WITH: MAY,B AT 2109 ON 11/01/15 BY MOSLEY,J   Magnesium     Status: None   Collection Time: 11/01/15  8:00 PM  Result Value Ref Range   Magnesium 2.0 1.7 - 2.4 mg/dL  Glucose, capillary     Status: Abnormal   Collection Time: 11/01/15  9:19 PM  Result Value Ref Range   Glucose-Capillary 176 (H) 65  - 99 mg/dL   Comment 1 Notify RN    Comment 2 Document in Chart   Protime-INR     Status: Abnormal   Collection Time: 11/01/15 11:18 PM  Result Value Ref Range   Prothrombin Time 16.0 (H) 11.6 - 15.2 seconds   INR 1.32 0.00 - 1.49  Lactic acid, plasma     Status: Abnormal   Collection Time: 11/01/15 11:20 PM  Result Value Ref Range   Lactic Acid, Venous 2.9 (HH) 0.5 - 1.9 mmol/L    Comment: CRITICAL RESULT CALLED TO, READ BACK BY AND VERIFIED WITH: B MAY RN @ 0008 ON 11/02/15 BY C DAVIS   Basic metabolic panel     Status: Abnormal   Collection Time: 11/02/15  2:43 AM  Result Value Ref Range   Sodium 145 135 - 145 mmol/L   Potassium 4.6 3.5 - 5.1 mmol/L   Chloride 120 (H) 101 - 111 mmol/L   CO2 15 (L) 22 - 32 mmol/L   Glucose, Bld 84 65 - 99 mg/dL   BUN 47 (H) 6 - 20 mg/dL   Creatinine, Ser 1.36 (H) 0.44 - 1.00 mg/dL   Calcium 9.5 8.9 - 10.3 mg/dL   GFR calc non Af Amer 37 (L) >60 mL/min   GFR calc Af Amer 43 (L) >60 mL/min    Comment: (  NOTE) The eGFR has been calculated using the CKD EPI equation. This calculation has not been validated in all clinical situations. eGFR's persistently <60 mL/min signify possible Chronic Kidney Disease.    Anion gap 10 5 - 15  Magnesium     Status: None   Collection Time: 11/02/15  2:43 AM  Result Value Ref Range   Magnesium 2.0 1.7 - 2.4 mg/dL  Lactic acid, plasma     Status: Abnormal   Collection Time: 11/02/15  2:43 AM  Result Value Ref Range   Lactic Acid, Venous 3.0 (HH) 0.5 - 1.9 mmol/L    Comment: CRITICAL RESULT CALLED TO, READ BACK BY AND VERIFIED WITH: B MAY RN @ 0335 ON 11/02/15 BY C DAVIS   Glucose, capillary     Status: None   Collection Time: 11/02/15  5:43 AM  Result Value Ref Range   Glucose-Capillary 99 65 - 99 mg/dL  CBC     Status: Abnormal   Collection Time: 11/02/15  5:52 AM  Result Value Ref Range   WBC 23.0 (H) 4.0 - 10.5 K/uL    Comment: WHITE COUNT CONFIRMED ON SMEAR RARE NRBCs    RBC 2.07 (L) 3.87 -  5.11 MIL/uL   Hemoglobin 6.7 (LL) 12.0 - 15.0 g/dL    Comment: REPEATED TO VERIFY CRITICAL RESULT CALLED TO, READ BACK BY AND VERIFIED WITH: MAY RN AT 2725 ON 07.13.17 BY SHUEA    HCT 20.6 (L) 36.0 - 46.0 %   MCV 99.5 78.0 - 100.0 fL   MCH 32.4 26.0 - 34.0 pg   MCHC 32.5 30.0 - 36.0 g/dL   RDW 18.5 (H) 11.5 - 15.5 %   Platelets 178 150 - 400 K/uL    Comment: PLATELET COUNT CONFIRMED BY SMEAR  TSH     Status: None   Collection Time: 11/02/15  5:52 AM  Result Value Ref Range   TSH 1.037 0.350 - 4.500 uIU/mL  Prepare RBC     Status: None   Collection Time: 11/02/15  7:30 AM  Result Value Ref Range   Order Confirmation ORDER PROCESSED BY BLOOD BANK   Glucose, capillary     Status: None   Collection Time: 11/02/15  7:32 AM  Result Value Ref Range   Glucose-Capillary 91 65 - 99 mg/dL    Ct Head Wo Contrast  11/01/2015  CLINICAL DATA:  75 year old female with confusion EXAM: CT HEAD WITHOUT CONTRAST TECHNIQUE: Contiguous axial images were obtained from the base of the skull through the vertex without intravenous contrast. COMPARISON:  CT dated 12/26/2014 FINDINGS: There is mild age-related atrophy and chronic microvascular ischemic changes. There is no acute intracranial hemorrhage. No mass effect or midline shift noted. The visualized paranasal sinuses and mastoid air cells are clear. The calvarium is intact. IMPRESSION: No acute intracranial hemorrhage. Mild age-related atrophy and chronic microvascular ischemic disease. If symptoms persist and there are no contraindications, MRI may provide better evaluation if clinically indicated Electronically Signed   By: Anner Crete M.D.   On: 11/01/2015 21:51   Dg Abd Acute W/chest  11/01/2015  CLINICAL DATA:  Peptic ulcer disease, concern for GI bleed, check for free air. Abdominal pain. EXAM: DG ABDOMEN ACUTE W/ 1V CHEST COMPARISON:  08/22/2015 FINDINGS: Patchy airspace disease in the right upper lobe and right lower lobe, possible pneumonia.  This was not present on the recent study. No effusion. Left lung clear. Nonobstructive bowel gas pattern.  No free air. Surgical clips in the gallbladder fossa. Lumbar levoscoliosis.  No acute skeletal abnormality. No urinary tract calculi. IMPRESSION: Infiltrate in the right lower lobe and right upper lobe, suspicious for pneumonia Normal bowel gas pattern.  No free air. Electronically Signed   By: Franchot Gallo M.D.   On: 11/01/2015 13:29               Blood pressure 127/44, pulse 96, temperature 98.4 F (36.9 C), temperature source Oral, resp. rate 22, height 5' (1.524 m), weight 156 lb (70.761 kg), SpO2 100 %.  Physical exam:   General-- very sleepy and drowsy African-American female who is receiving a blood transfusion ENT-- nonicteric no signs of bleeding in the mouth Neck-- no lymphadenopathy Heart-- regular rate and rhythm without murmurs are gallops Lungs-- clear Abdomen-- nondistended and soft with good bowel sounds with mild upper tenderness Psych-- alert and seems to answer questions appropriately but is somewhat drowsy   Assessment: 1. G.I. bleed. History of melanoma and questionable history of hematemesis. She states that she had taken some Pepto-Bismol but doesn't remember when this was. Her hemoglobin dropped considerably in my suspicion is that she is bleeding from in-state induced ulcer. Her BUN and creatinine are both elevated. She was hypotensive. 2. Breast cancer. She status post lumpectomy with radioactive seed implant 3. History of mild dementia 4. GERD 5. CAD with stent placement 6. History of schizophrenia. Lives in assisted living facility 7. Type II diabetes Plan: 1. Will proceed with bedside EGD today. Have discussed this with patient and with daughter [name].   Dorette Hartel JR,Magon Croson L 11/02/2015, 9:50 AM   This note was created using voice recognition software and minor errors may Have occurred unintentionally. Pager: [phone] If no answer or  after hours call [phone]

## 2015-11-02 NOTE — Progress Notes (Signed)
Pt is from [facility]. CSW is available to assist with d/c planning if a higher level of care is needed at d/c. PT eval may be helpful, when medically appropriate, determining  level of care needs.  Werner Lean LCSW [phone]

## 2015-11-02 NOTE — Op Note (Signed)
[facility] Patient Name: [name] Procedure Date: 11/02/2015 MRN: [id number] Attending MD: Nancy Fetter Dr., MD Date of Birth: 1941/01/15 CSN: [id number] Age: 75 Admit Type: Inpatient Procedure:                Upper GI endoscopy Indications:              Hematemesis, Melena, Occult blood in stool Providers:                Jeneen Rinks L. Delio Slates Dr., MD, Vista Lawman, RN, Cherylynn Ridges, Technician Referring MD:              Medicines:                Fentanyl 25 micrograms IV, Midazolam 3 mg IV,                            Cetacaine spray Complications:            No immediate complications. Estimated Blood Loss:     Estimated blood loss: none. Procedure:                Pre-Anesthesia Assessment:                           - Prior to the procedure, a History and Physical                            was performed, and patient medications and                            allergies were reviewed. The patient's tolerance of                            previous anesthesia was also reviewed. The risks                            and benefits of the procedure and the sedation                            options and risks were discussed with the patient.                            All questions were answered, and informed consent                            was obtained. Prior Anticoagulants: The patient has                            taken no previous anticoagulant or antiplatelet                            agents. ASA Grade Assessment: III - A patient with  severe systemic disease. After reviewing the risks                            and benefits, the patient was deemed in                            satisfactory condition to undergo the procedure.                           After obtaining informed consent, the endoscope was                            passed under direct vision. Throughout the   procedure, the patient's blood pressure, pulse, and                            oxygen saturations were monitored continuously. The                            EG-[id number] scope was introduced through the                            mouth, and advanced to the second part of duodenum.                            The upper GI endoscopy was accomplished without                            difficulty. The patient tolerated the procedure                            well. Scope In: Scope Out: Findings:      Severe esophagitis with bleeding was found in the entire esophagus.       Biopsies were taken with a cold forceps for histology. Cells for       cytology were obtained by brushing. these will be sent for fungal smear.      The stomach was normal.      The examined duodenum was normal. Impression:               - Severe candidiasis esophagitis. Biopsied. Cells                            for cytology obtained.                           - Normal stomach.                           - Normal examined duodenum. Moderate Sedation:      Moderate (conscious) sedation was administered by the endoscopy nurse       and supervised by the endoscopist. The following parameters were       monitored: oxygen saturation, heart rate, blood pressure, and response       to care. Total physician intraservice time was 15 minutes. Recommendation:           -  Full liquid diet.                           - Diflucan (fluconazole) 100 mg PO daily for 5 days.                           - Continue present medications. Procedure Code(s):        --- Professional ---                           [phone], Esophagogastroduodenoscopy, flexible,                            transoral; with biopsy, single or multiple                           99152, Moderate sedation services provided by the                            same physician or other qualified health care                            professional performing the diagnostic or                             therapeutic service that the sedation supports,                            requiring the presence of an independent trained                            observer to assist in the monitoring of the                            patient's level of consciousness and physiological                            status; initial 15 minutes of intraservice time,                            patient age 17 years or older Diagnosis Code(s):        --- Professional ---                           B37.81, Candidal esophagitis                           K92.0, Hematemesis                           K92.1, Melena (includes Hematochezia) CPT copyright 2016 [employer]. All rights reserved. The codes documented in this report are preliminary and upon coder review may  be revised to meet current compliance requirements. Nancy Fetter Dr., MD 11/02/2015 12:58:38 PM This report has been signed electronically. Number of Addenda: 0

## 2015-11-02 NOTE — Progress Notes (Signed)
CRITICAL VALUE ALERT  Critical value received: Lactic   Date of notification:  11/02/15  Time of notification:  0340  Critical value read back yes   Nurse who received alert:  Theodosia Quay RN   MD notified (1st page):  Baltazar Najjar NP   Time of first page:  0345  MD notified (2nd page):  Time of second page:  Responding MD:   Time MD responded:

## 2015-11-02 NOTE — Progress Notes (Signed)
CRITICAL VALUE ALERT  Critical value received:  HGB 6.7  Date of notification: 11/02/15  Time of notification:  0637  Critical value read back:yes   Nurse who received alert:  Chancy Hurter RN   MD notified (1st page):  Tylene Fantasia NP   Time of first page:  [phone] am   MD notified (2nd page):  Time of second page:  Responding MD:    Time MD responded:

## 2015-11-03 ENCOUNTER — Encounter (HOSPITAL_COMMUNITY): Payer: Self-pay | Admitting: Gastroenterology

## 2015-11-03 ENCOUNTER — Inpatient Hospital Stay (HOSPITAL_COMMUNITY): Payer: Medicare Other

## 2015-11-03 DIAGNOSIS — R0600 Dyspnea, unspecified: Secondary | ICD-10-CM

## 2015-11-03 DIAGNOSIS — B3781 Candidal esophagitis: Secondary | ICD-10-CM

## 2015-11-03 LAB — TYPE AND SCREEN
ABO/RH(D): AB POS
Antibody Screen: NEGATIVE
UNIT DIVISION: 0
Unit division: 0

## 2015-11-03 LAB — ECHOCARDIOGRAM COMPLETE
HEIGHTINCHES: 60 in
Weight: 2496 oz

## 2015-11-03 LAB — CBC
HCT: 29.4 % — ABNORMAL LOW (ref 36.0–46.0)
HEMOGLOBIN: 10 g/dL — AB (ref 12.0–15.0)
MCH: 32.3 pg (ref 26.0–34.0)
MCHC: 34 g/dL (ref 30.0–36.0)
MCV: 94.8 fL (ref 78.0–100.0)
Platelets: 220 10*3/uL (ref 150–400)
RBC: 3.1 MIL/uL — ABNORMAL LOW (ref 3.87–5.11)
RDW: 18.7 % — ABNORMAL HIGH (ref 11.5–15.5)
WBC: 21.3 10*3/uL — AB (ref 4.0–10.5)

## 2015-11-03 LAB — HEMOGLOBIN A1C
Hgb A1c MFr Bld: 6 % — ABNORMAL HIGH (ref 4.8–5.6)
MEAN PLASMA GLUCOSE: 126 mg/dL

## 2015-11-03 LAB — GLUCOSE, CAPILLARY
GLUCOSE-CAPILLARY: 110 mg/dL — AB (ref 65–99)
GLUCOSE-CAPILLARY: 133 mg/dL — AB (ref 65–99)
Glucose-Capillary: 147 mg/dL — ABNORMAL HIGH (ref 65–99)
Glucose-Capillary: 77 mg/dL (ref 65–99)

## 2015-11-03 LAB — BASIC METABOLIC PANEL
ANION GAP: 7 (ref 5–15)
BUN: 26 mg/dL — ABNORMAL HIGH (ref 6–20)
CALCIUM: 8.9 mg/dL (ref 8.9–10.3)
CO2: 17 mmol/L — ABNORMAL LOW (ref 22–32)
Chloride: 122 mmol/L — ABNORMAL HIGH (ref 101–111)
Creatinine, Ser: 0.85 mg/dL (ref 0.44–1.00)
GLUCOSE: 96 mg/dL (ref 65–99)
POTASSIUM: 3.8 mmol/L (ref 3.5–5.1)
SODIUM: 146 mmol/L — AB (ref 135–145)

## 2015-11-03 LAB — HIV ANTIBODY (ROUTINE TESTING W REFLEX): HIV SCREEN 4TH GENERATION: NONREACTIVE

## 2015-11-03 MED ORDER — IRBESARTAN 150 MG PO TABS
75.0000 mg | ORAL_TABLET | Freq: Every day | ORAL | Status: DC
  Administered 2015-11-03 – 2015-11-09 (×7): 75 mg via ORAL
  Filled 2015-11-03 (×8): qty 1

## 2015-11-03 MED ORDER — FUROSEMIDE 20 MG PO TABS
20.0000 mg | ORAL_TABLET | Freq: Every day | ORAL | Status: DC
  Administered 2015-11-03 – 2015-11-09 (×7): 20 mg via ORAL
  Filled 2015-11-03 (×7): qty 1

## 2015-11-03 MED ORDER — VANCOMYCIN HCL 500 MG IV SOLR
500.0000 mg | Freq: Two times a day (BID) | INTRAVENOUS | Status: DC
  Administered 2015-11-03 – 2015-11-05 (×5): 500 mg via INTRAVENOUS
  Filled 2015-11-03 (×6): qty 500

## 2015-11-03 MED ORDER — AMLODIPINE BESYLATE 5 MG PO TABS
5.0000 mg | ORAL_TABLET | Freq: Every day | ORAL | Status: DC
  Administered 2015-11-03 – 2015-11-09 (×7): 5 mg via ORAL
  Filled 2015-11-03 (×7): qty 1

## 2015-11-03 NOTE — Progress Notes (Addendum)
PROGRESS NOTE                                                                                                                                                                                                             Patient Demographics:    [name], is a 75 y.o. female, DOB - [birth year], [id number]  Admit date - 11/01/2015   Admitting Physician Florencia Reasons, MD  Outpatient Primary MD for the patient is [name], MD  LOS - 2  Outpatient Specialists: none  Chief Complaint  Patient presents with  . Weakness  . GI Bleeding  . Hypotension       Brief Narrative   75 year old female with history of mild dementia, schizophrenia, insulin-dependent type 2 diabetes mellitus, CAD, hypertension, history of breast cysts status post lumpectomy in 08/2015 who is a resident of a left was sent to [facility] ED with several days of coffee-ground emesis and dark stools. As per EMS patient was orthostatic with blood pressure of 86/48 mmHg and heart rate of 132. She was also hypoxic with O2 sat of 87% on room air. FOBT was positive. Patient was septic with leukocytosis and lactic acidosis, acute kidney injury and abdominal x-ray with chest showing right upper and lower lobe infiltrate. Patient admitted to stepdown unit With septic shock. Subsequent hemoglobin dropped to 6.7 and wasTransfused with 2 units PRBC. EGD done showed esophageal candidiasis without ulceration or source of bleeding.   Subjective:   Still feels weak. Abdominal pain better.   Assessment  & Plan :   Principal problem Acute upper GI bleed No clear source. EGD negative for ulceration or bleed. Hemoglobin improved with transfusion. Continue oral Protonix. Counseled on smoking cessation and avoiding NSAIDs.  Septic shock with acute hypoxic respiratory failure Suspect secondary to multilobar pneumonia and acute GI bleed. Now maintaining sats on nasal  cannula. Still has leukocytosis but sepsis otherwise resolved. Continue empiric vancomycin and Zosyn. Cultures so far negative.  Esophageal candidiasis Biopsy and cultures sent. Started on fluconazole.  Insulin-dependent type 2 diabetes mellitus Holding Lantus. Monitor on sliding scale coverage. A1c of 6.  Essential hypertension Blood pressure medications were held due to hypotension on presentation. Now elevated. Resume home blood pressure medications.  Prolonged QTC  Haldol held. Normal electrolytes. QTC improved and repeat EKG (491).  Acute kidney injury Suspect prerenal due  to septic shock. Resolved with fluids.  Metabolic acidosis  secondary to dehydration/ sepsis with lactic acidosis. Improving.   Code Status : Full code  Family Communication  : None at bedside. I will update her daughter on the phone.  Disposition Plan  : Transfer to medical floor. PT and swallow evaluation.  Barriers For Discharge : Improving symptoms.  Consults  :  [facility]  Procedures  : EGD  DVT Prophylaxis  : SCDs  Lab Results  Component Value Date   PLT 220 11/03/2015    Antibiotics  :    Anti-infectives    Start     Dose/Rate Route Frequency Ordered Stop   11/03/15 1000  vancomycin (VANCOCIN) 500 mg in sodium chloride 0.9 % 100 mL IVPB     500 mg 100 mL/hr over 60 Minutes Intravenous Every 12 hours 11/03/15 0920     11/02/15 1400  vancomycin (VANCOCIN) IVPB 750 mg/150 ml premix  Status:  Discontinued     750 mg 150 mL/hr over 60 Minutes Intravenous Every 24 hours 11/01/15 1455 11/03/15 0920   11/02/15 1400  fluconazole (DIFLUCAN) tablet 200 mg     200 mg Oral Daily 11/02/15 1306     11/01/15 2200  piperacillin-tazobactam (ZOSYN) IVPB 3.375 g     3.375 g 12.5 mL/hr over 240 Minutes Intravenous Every 8 hours 11/01/15 1455     11/01/15 1415  piperacillin-tazobactam (ZOSYN) IVPB 3.375 g     3.375 g 100 mL/hr over 30 Minutes Intravenous  Once 11/01/15 1409 11/01/15 1546   11/01/15  1415  vancomycin (VANCOCIN) IVPB 1000 mg/200 mL premix     1,000 mg 200 mL/hr over 60 Minutes Intravenous  Once 11/01/15 1409 11/01/15 1626        Objective:   Filed Vitals:   11/03/15 0400 11/03/15 0500 11/03/15 0700 11/03/15 0800  BP: 161/52 163/57 161/61 179/62  Pulse: 87 87 85 91  Temp:    98.8 F (37.1 C)  TempSrc:    Oral  Resp: 22 21 21 23   Height:      Weight:      SpO2: 99% 96% 99% 97%    Wt Readings from Last 3 Encounters:  11/02/15 70.761 kg (156 lb)  10/04/15 71.079 kg (156 lb 11.2 oz)  09/07/15 71.668 kg (158 lb)     Intake/Output Summary (Last 24 hours) at 11/03/15 1023 Last data filed at 11/02/15 2200  Gross per 24 hour  Intake    715 ml  Output    300 ml  Net    415 ml     Physical Exam  [id number] fatigued, HEENT: Pallor present, moist mucosa, supple neck, No oral thrush Chest: Coarse breath sounds over right lung, few scattered rhonchi. CVS: N S1&S2, no murmurs, rubs or gallop GI: soft, NT, ND, BS+ Musculoskeletal: warm, no edema CNS: Alert and oriented    Data Review:    CBC  Recent Labs Lab 11/01/15 1236 11/01/15 1300 11/01/15 1854 11/02/15 0552 11/03/15 0303  WBC 25.6*  --   --  23.0* 21.3*  HGB 9.6* 10.2* 8.0* 6.7* 10.0*  HCT 28.6* 30.0* 23.6* 20.6* 29.4*  PLT 292  --   --  178 220  MCV 99.7  --   --  99.5 94.8  MCH 33.4  --   --  32.4 32.3  MCHC 33.6  --   --  32.5 34.0  RDW 17.8*  --   --  18.5* 18.7*  LYMPHSABS 1.5  --   --   --   --  MONOABS 1.5*  --   --   --   --   EOSABS 0.0  --   --   --   --   BASOSABS 0.0  --   --   --   --     Chemistries   Recent Labs Lab 11/01/15 1236 11/01/15 1300 11/01/15 2000 11/02/15 0243 11/03/15 0303  NA  --  146*  --  145 146*  K  --  3.9  --  4.6 3.8  CL  --  116*  --  120* 122*  CO2  --   --   --  15* 17*  GLUCOSE  --  157*  --  84 96  BUN  --  52*  --  47* 26*  CREATININE  --  1.80*  --  1.36* 0.85  CALCIUM  --   --   --  9.5 8.9  MG  --   --  2.0 2.0  --   AST  13*  --   --   --   --   ALT 9*  --   --   --   --   ALKPHOS 55  --   --   --   --   BILITOT 0.3  --   --   --   --    ------------------------------------------------------------------------------------------------------------------ No results for input(s): CHOL, HDL, LDLCALC, TRIG, CHOLHDL, LDLDIRECT in the last 72 hours.  Lab Results  Component Value Date   HGBA1C 6.0* 11/01/2015   ------------------------------------------------------------------------------------------------------------------  Recent Labs  11/02/15 0552  TSH 1.037   ------------------------------------------------------------------------------------------------------------------  Recent Labs  11/01/15 1236  VITAMINB12 1341*  FOLATE 16.6  FERRITIN 96  TIBC 202*  IRON 7*  RETICCTPCT 3.1    Coagulation profile  Recent Labs Lab 11/01/15 2318  INR 1.32    No results for input(s): DDIMER in the last 72 hours.  Cardiac Enzymes No results for input(s): CKMB, TROPONINI, MYOGLOBIN in the last 168 hours.  Invalid input(s): CK ------------------------------------------------------------------------------------------------------------------    Component Value Date/Time   BNP 143.0* 12/10/2014 2311    Inpatient Medications  Scheduled Meds: . amLODipine  5 mg Oral Daily  . cloZAPine  150 mg Oral QHS  . donepezil  10 mg Oral QHS  . fluconazole  200 mg Oral Daily  . furosemide  20 mg Oral Daily  . insulin aspart  0-15 Units Subcutaneous TID WC  . irbesartan  75 mg Oral Daily  . memantine  10 mg Oral BID  . pantoprazole  40 mg Oral Daily  . piperacillin-tazobactam (ZOSYN)  IV  3.375 g Intravenous Q8H  . sodium chloride flush  3 mL Intravenous Q12H  . vancomycin  500 mg Intravenous Q12H   Continuous Infusions:   PRN Meds:.fluticasone, HYDROcodone-acetaminophen  Micro Results Recent Results (from the past 240 hour(s))  Blood Culture (routine x 2)     Status: None (Preliminary result)    Collection Time: 11/01/15  2:07 PM  Result Value Ref Range Status   Specimen Description BLOOD BLOOD RIGHT FOREARM  Final   Special Requests IN PEDIATRIC BOTTLE 3 CC  Final   Culture   Final    NO GROWTH < 24 HOURS Performed at [facility]    Report Status PENDING  Incomplete  Urine culture     Status: Abnormal   Collection Time: 11/01/15  3:54 PM  Result Value Ref Range Status   Specimen Description URINE, CATHETERIZED  Final   Special Requests NONE  Final   Culture (A)  Final    1,000 COLONIES/mL INSIGNIFICANT GROWTH Performed at [facility]    Report Status 11/02/2015 FINAL  Final  MRSA PCR Screening     Status: None   Collection Time: 11/01/15  5:27 PM  Result Value Ref Range Status   MRSA by PCR NEGATIVE NEGATIVE Final    Comment:        The GeneXpert MRSA Assay (FDA approved for NASAL specimens only), is one component of a comprehensive MRSA colonization surveillance program. It is not intended to diagnose MRSA infection nor to guide or monitor treatment for MRSA infections.   Culture, blood (routine x 2)     Status: None (Preliminary result)   Collection Time: 11/01/15  8:05 PM  Result Value Ref Range Status   Specimen Description BLOOD LEFT HAND  Final   Special Requests IN PEDIATRIC BOTTLE 3CC  Final   Culture   Final    NO GROWTH < 24 HOURS Performed at [facility]    Report Status PENDING  Incomplete  Culture, blood (routine x 2)     Status: None (Preliminary result)   Collection Time: 11/01/15  8:17 PM  Result Value Ref Range Status   Specimen Description BLOOD LEFT ANTECUBITAL  Final   Special Requests IN PEDIATRIC BOTTLE 2CC  Final   Culture   Final    NO GROWTH < 24 HOURS Performed at [facility]    Report Status PENDING  Incomplete    Radiology Reports Ct Head Wo Contrast  11/01/2015  CLINICAL DATA:  75 year old female with confusion EXAM: CT HEAD WITHOUT CONTRAST TECHNIQUE: Contiguous axial images were obtained  from the base of the skull through the vertex without intravenous contrast. COMPARISON:  CT dated 12/26/2014 FINDINGS: There is mild age-related atrophy and chronic microvascular ischemic changes. There is no acute intracranial hemorrhage. No mass effect or midline shift noted. The visualized paranasal sinuses and mastoid air cells are clear. The calvarium is intact. IMPRESSION: No acute intracranial hemorrhage. Mild age-related atrophy and chronic microvascular ischemic disease. If symptoms persist and there are no contraindications, MRI may provide better evaluation if clinically indicated Electronically Signed   By: Anner Crete M.D.   On: 11/01/2015 21:51   Dg Abd Acute W/chest  11/01/2015  CLINICAL DATA:  Peptic ulcer disease, concern for GI bleed, check for free air. Abdominal pain. EXAM: DG ABDOMEN ACUTE W/ 1V CHEST COMPARISON:  08/22/2015 FINDINGS: Patchy airspace disease in the right upper lobe and right lower lobe, possible pneumonia. This was not present on the recent study. No effusion. Left lung clear. Nonobstructive bowel gas pattern.  No free air. Surgical clips in the gallbladder fossa. Lumbar levoscoliosis. No acute skeletal abnormality. No urinary tract calculi. IMPRESSION: Infiltrate in the right lower lobe and right upper lobe, suspicious for pneumonia Normal bowel gas pattern.  No free air. Electronically Signed   By: Franchot Gallo M.D.   On: 11/01/2015 13:29    Time Spent in minutes  25   Louellen Molder M.D on 11/03/2015 at 10:23 AM  Between 7am to 7pm - Pager - [phone]  After 7pm go to [email or web address] - password [facility]  [facility] -  Office  [phone]

## 2015-11-03 NOTE — Progress Notes (Signed)
  Echocardiogram 2D Echocardiogram has been performed.  Darlina Sicilian M 11/03/2015, 8:10 AM

## 2015-11-03 NOTE — Progress Notes (Signed)
Pharmacy Antibiotic Note  [name] is a 75 y.o. female admitted on 11/01/2015 with c/o weakness, hypotension and GIB.  CXR indicated infiltrate in the right low lobe and right upper lobe with abx started on admission for broad empiric coverage for suspected PNA.  S/p EGD on 7/13 that showed severe esophagitis with fluconazole started on 7/13 per GI.  Today, 11/03/2015: - day #2 of abx - afeb, wbc down 21 - scr trending down 0.85 (crcl~51)   Plan: - with improving renal funct, will adjust vancomycin to 500 mg IV q12h -  Continue Zosyn 3.375 gr IV q8h EI.   ___________________________________  Height: 5' (152.4 cm) Weight: 156 lb (70.761 kg) IBW/kg (Calculated) : 45.5  Temp (24hrs), Avg:98.6 F (37 C), Min:98.3 F (36.8 C), Max:98.9 F (37.2 C)   Recent Labs Lab 11/01/15 1236 11/01/15 1300  11/01/15 1903 11/01/15 2320 11/02/15 0243 11/02/15 0552 11/02/15 1615 11/02/15 1827 11/03/15 0303  WBC 25.6*  --   --   --   --   --  23.0*  --   --  21.3*  CREATININE  --  1.80*  --   --   --  1.36*  --   --   --  0.85  LATICACIDVEN  --  2.30*  < > 4.7* 2.9* 3.0*  --  2.2* 1.9  --   < > = values in this interval not displayed.  Estimated Creatinine Clearance: 51 mL/min (by C-G formula based on Cr of 0.85).    Allergies  Allergen Reactions  . Codeine Phosphate Other (See Comments)    REACTION: unspecified  . Glimepiride Other (See Comments)    REACTION: unspecified  . Glyburide Other (See Comments)    REACTION: unspecified  . Levofloxacin Nausea Only    Made her weak and nauseated  . Shellfish Allergy Swelling  . Tramadol Other (See Comments)    unknown    Antimicrobials this admission: 7/12 vancomycin >>   7/12 Zosyn >>  7/13 fluconazole (candida esophagitis)>> (GI said to treat for 5 days)  Dose adjustments this admission: 7/14 changed vanc to 500 mg q12h d/t improving renal funct  Microbiology results: 7/12 BCx x2 (at 2000): 7/12 [place] (at 1400): 7/12 UCx:  insignif growth 7/12 MRSA PCR (-) 7/13 HIV antb (-)  Thank you for allowing pharmacy to be a part of this patient's care.  Dia Sitter, PharmD, BCPS 11/03/2015 9:11 AM

## 2015-11-03 NOTE — Evaluation (Signed)
Physical Therapy Evaluation Patient Details Name: [name] MRN: [id number] DOB: 1941-03-01 Today's Date: 11/03/2015   History of Present Illness  75 year old female with history of mild dementia, schizophrenia, insulin-dependent type 2 diabetes mellitus, CAD, hypertension, history of breast cysts status post lumpectomy in 08/2015 and admitted for acute upper GI bleed and septic shock.  Clinical Impression  Pt admitted with above diagnosis. Pt currently with functional limitations due to the deficits listed below (see PT Problem List).  Pt will benefit from skilled PT to increase their independence and safety with mobility to allow discharge to the venue listed below.  Pt very fatigued and shivering during session.  Pt only able to tolerate standing briefly and then assisted back to bed.  Pt states she is from independent living and would benefit from ST-SNF upon d/c.     Follow Up Recommendations SNF;Supervision/Assistance - 24 hour    Equipment Recommendations  None recommended by PT    Recommendations for Other Services       Precautions / Restrictions Precautions Precautions: Fall      Mobility  Bed Mobility Overal bed mobility: Needs Assistance Bed Mobility: Supine to Sit;Sit to Supine     Supine to sit: Max assist;HOB elevated Sit to supine: Mod assist   General bed mobility comments: multimodal cues for technique, assist for LEs over EOB and trunk upright, assist for LEs onto bed  Transfers Overall transfer level: Needs assistance Equipment used: Rolling walker (2 wheeled) Transfers: Sit to/from Stand Sit to Stand: Min assist;From elevated surface         General transfer comment: verbal cues for hand placement, assist to rise and steady, pt able to stand approx 1 minute, fatigues quickly (changed bed pads due to urine - RN notified)  Ambulation/Gait                Stairs            Wheelchair Mobility    Modified Rankin (Stroke Patients  Only)       Balance                                             Pertinent Vitals/Pain Pain Assessment: No/denies pain    Home Living       Type of Home: Independent living facility         Home Equipment: Walker - 2 wheels      Prior Function Level of Independence: Independent with assistive device(s)               Hand Dominance        Extremity/Trunk Assessment   Upper Extremity Assessment: Generalized weakness           Lower Extremity Assessment: Generalized weakness         Communication   Communication: No difficulties  Cognition Arousal/Alertness: Awake/alert Behavior During Therapy: WFL for tasks assessed/performed Overall Cognitive Status: Within Functional Limits for tasks assessed                      General Comments      Exercises        Assessment/Plan    PT Assessment Patient needs continued PT services  PT Diagnosis Difficulty walking;Generalized weakness   PT Problem List Decreased strength;Decreased activity tolerance;Decreased mobility;Decreased knowledge of use of DME  PT Treatment Interventions DME instruction;Gait training;Functional  mobility training;Patient/family education;Therapeutic activities;Therapeutic exercise   PT Goals (Current goals can be found in the Care Plan section) Acute Rehab PT Goals PT Goal Formulation: With patient Time For Goal Achievement: 11/17/15 Potential to Achieve Goals: Good    Frequency Min 3X/week   Barriers to discharge        Co-evaluation               End of Session Equipment Utilized During Treatment: Gait belt;Oxygen Activity Tolerance: Patient limited by fatigue Patient left: in bed;with call bell/phone within reach;with bed alarm set Nurse Communication: Mobility status         Time: 1400-1415 PT Time Calculation (min) (ACUTE ONLY): 15 min   Charges:   PT Evaluation $PT Eval Low Complexity: 1 Procedure     PT G Codes:         Caio Devera,KATHrine E 11/03/2015, 2:40 PM Carmelia Bake, PT, DPT 11/03/2015 Pager: [phone]

## 2015-11-03 NOTE — Evaluation (Signed)
Clinical/Bedside Swallow Evaluation Patient Details  Name: [name] MRN: [id number] Date of Birth: [birth year]  Today's Date: 11/03/2015 Time: SLP Start Time (ACUTE ONLY): 1310 SLP Stop Time (ACUTE ONLY): 1335 SLP Time Calculation (min) (ACUTE ONLY): 25 min  Past Medical History:  Past Medical History  Diagnosis Date  . Anemia, iron deficiency   . Anxiety   . Type II or unspecified type diabetes mellitus without mention of complication, not stated as uncontrolled   . GERD (gastroesophageal reflux disease)   . HTN (hypertension)   . Peripheral neuropathy ([place])   . Hyperlipidemia   . Allergic rhinitis   . PVD (peripheral vascular disease) ([facility])     Left subclavian 70% stenosis  . Dementia   . IBS (irritable bowel syndrome)   . Syncope     EP study 1996 w/atrial tachycardia  . Esophageal motility disorder   . Renal insufficiency   . Schizophrenia ([place])     paranoid schizophrenic  . Arthritis     back  . CAD (coronary artery disease)     CAD stent placed 2005  . Pneumonia 12-09-2014    adm with CAP requiring mechanical ventilation x 10d  . Cancer [facility])     Left breast invasive ductal carcinoma   Past Surgical History:  Past Surgical History  Procedure Laterality Date  . Abdominal hysterectomy    . Ptca  1995  . Knee arthroplasty      Right   . Breast biopsy      Negative  . Joint replacement Bilateral   . Radioactive seed guided mastectomy with axillary sentinel lymph node biopsy Left 08/17/2015    Procedure: RADIOACTIVE SEED GUIDED LUMPECTOMY WITH AXILLARY SENTINEL LYMPH NODE BIOPSY;  Surgeon: Donnie Mesa, MD;  Location: [place];  Service: General;  Laterality: Left;  . Re-excision of breast lumpectomy Left 09/07/2015    Procedure: RE-EXCISION OF LEFT BREAST LUMPECTOMY;  Surgeon: Donnie Mesa, MD;  Location: [place];  Service: General;  Laterality: Left;  . Esophagogastroduodenoscopy N/A 11/02/2015    Procedure:  ESOPHAGOGASTRODUODENOSCOPY (EGD);  Surgeon: Laurence Spates, MD;  Location: [facility];  Service: Endoscopy;  Laterality: N/A;   HPI:  75 year old female who has been in the ALF. She has a history of insulin-dependent type II diabetes, mild dementia, and schizophrenia. She has had previous surgery for breast cancer and apparently did not need chemoradiation therapy back in May. Her main ongoing complaint is chronic back pain. According to her into her daughter she takes a large amount of ibuprofen. She admits to taking ibuprofen at least 2 to 4 times daily for chronic back pain which has been going on for several years. She developed hematemesis at the assisted living facility was brought to the emergency room and was slightly orthostatic with systolic blood pressure 86 in heart rate 132. She was also slightly hypoxic. Her stool was positive for blood was dark. Hemoglobin was 10.2 and drop to 6.7 with hydration. She was profoundly iron deficient. She denies ever having a history of ulcers or previous colonoscopy. She denies EGD. It's notable that she was on Nexium is outpatient for esophageal reflux and presumably she had been taking. She does complain of reflux and epigastric pain. She has no family history: cancer to the best of her knowledge; CXR on 11/01/15 indicated Infiltrate in the right lower lobe and right upper lobe, suspicious for pneumonia; pt c/o globus sensation intermittently during po intake; 11/01/15, CT head indicated No acute intracranial hemorrhage; MRI  recommended.  Assessment / Plan / Recommendation Clinical Impression   Pt with what appears to be primary esophageal dysphagia characterized by delayed cough with solids, easier transition of liquids/puree during intake, but need for liquids to transition puree after 2-3 consecutive small bites with clearance noted with liquid wash; pt c/o globus sensation during intake intermittently; EGD performed on 11/02/15; pt may benefit from esophageal  work-up and/or instrumental assessment for swallowing pending diet tolerance with Dysphagia 1 (puree)/thin liquids utilizing swallowing precautions to decrease esophageal symptoms (i.e.: globus sensation) of small bites/sips, alternating solids/liquids and using effortful swallow during po intake.  ST will f/u for diet tolerance/swallowing safety/education during po intake.     Aspiration Risk  Mild aspiration risk    Diet Recommendation   Dysphagia 1 (puree)/thin liquids  Medication Administration: Whole meds with puree    Other  Recommendations Recommended Consults: Consider esophageal assessment Oral Care Recommendations: Oral care BID   Follow up Recommendations  Other (comment) (TBD)    Frequency and Duration min 2x/week  1 week       Prognosis Prognosis for Safe Diet Advancement: Fair      Swallow Study   General Date of Onset: 11/01/15 HPI: 75 year old female who has been in the ALF. She has a history of insulin-dependent type II diabetes, mild dementia, and schizophrenia. She has had previous surgery for breast cancer and apparently did not need chemoradiation therapy back in May. Her main ongoing complaint is chronic back pain. According to her into her daughter she takes a large amount of ibuprofen. She admits to taking ibuprofen at least 2 to 4 times daily for chronic back pain which has been going on for several years. She developed hematemesis at the assisted living facility was brought to the emergency room and was slightly orthostatic with systolic blood pressure 86 in heart rate 132. She was also slightly hypoxic. Her stool was positive for blood was dark. Hemoglobin was 10.2 and drop to 6.7 with hydration. She was profoundly iron deficient. She denies ever having a history of ulcers or previous colonoscopy. She denies EGD. It's notable that she was on Nexium is outpatient for esophageal reflux and presumably she had been taking. She does complain of reflux and epigastric  pain. She has no family history: cancer to the best of her knowledge Type of Study: Bedside Swallow Evaluation Diet Prior to this Study: Thin liquids;Other (Comment) (Full liquids) Temperature Spikes Noted: Yes Respiratory Status: Nasal cannula History of Recent Intubation: No Behavior/Cognition: Alert;Cooperative Oral Cavity Assessment: Within Functional Limits Oral Care Completed by SLP: No Oral Cavity - Dentition: Adequate natural dentition;Missing dentition Vision: Impaired for self-feeding Self-Feeding Abilities: Needs assist;Needs set up Patient Positioning: Upright in bed Baseline Vocal Quality: Low vocal intensity Volitional Cough: Weak Volitional Swallow: Able to elicit    Oral/Motor/Sensory Function Overall Oral Motor/Sensory Function: Within functional limits   Ice Chips Ice chips: Within functional limits Presentation: Spoon   Thin Liquid Thin Liquid: Within functional limits Presentation: Cup;Straw    Nectar Thick Nectar Thick Liquid: Not tested   Honey Thick Honey Thick Liquid: Not tested   Puree Puree: Impaired Presentation: Spoon Pharyngeal Phase Impairments: Multiple swallows Other Comments: after several swallows, she needs liquid wash   Solid      Solid: Impaired Presentation: Spoon Pharyngeal Phase Impairments: Multiple swallows;Cough - Delayed    Functional Assessment Tool Used: NOMS Functional Limitations: Swallowing Swallow Current Status [id number]): At least 40 percent but less than 60 percent impaired, limited or restricted Swallow  Goal Status [phone]): At least 20 percent but less than 40 percent impaired, limited or restricted   Noelia Lenart,PAT, M.S., CCC-SLP 11/03/2015,1:48 PM

## 2015-11-03 NOTE — Progress Notes (Signed)
[facility] PROGRESS NOTE Subjective no obvious problems after EGD and biopsy yesterday. Patient states that she is having some discomfort swallowing.  Objective: Vital signs in last 24 hours: Temp:  [98.3 F (36.8 C)-99 F (37.2 C)] 99 F (37.2 C) (07/14 1045) Pulse Rate:  [82-108] 104 (07/14 1045) Resp:  [20-37] 20 (07/14 1045) BP: (92-187)/(39-87) 148/71 mmHg (07/14 1045) SpO2:  [92 %-100 %] 96 % (07/14 1045) Last BM Date: 11/01/15  Intake/Output from previous day: 07/13 0701 - 07/14 0700 In: 1045 [Blood:945; IV Piggyback:100] Out: 300 [Urine:300] Intake/Output this shift:    PE: General-- alert but somewhat somnolent  Abdomen-- mild epigastric tenderness  Lab Results:  Recent Labs  11/01/15 1236 11/01/15 1300 11/01/15 1854 11/02/15 0552 11/03/15 0303  WBC 25.6*  --   --  23.0* 21.3*  HGB 9.6* 10.2* 8.0* 6.7* 10.0*  HCT 28.6* 30.0* 23.6* 20.6* 29.4*  PLT 292  --   --  178 220   BMET  Recent Labs  11/01/15 1300 11/02/15 0243 11/03/15 0303  NA 146* 145 146*  K 3.9 4.6 3.8  CL 116* 120* 122*  CO2  --  15* 17*  CREATININE 1.80* 1.36* 0.85   LFT  Recent Labs  11/01/15 1236  PROT 6.0*  AST 13*  ALT 9*  ALKPHOS 55  BILITOT 0.3  BILIDIR <0.1*  IBILI NOT CALCULATED   PT/INR  Recent Labs  11/01/15 2318  LABPROT 16.0*  INR 1.32   PANCREAS No results for input(s): LIPASE in the last 72 hours.       Studies/Results: Ct Head Wo Contrast  11/01/2015  CLINICAL DATA:  75 year old female with confusion EXAM: CT HEAD WITHOUT CONTRAST TECHNIQUE: Contiguous axial images were obtained from the base of the skull through the vertex without intravenous contrast. COMPARISON:  CT dated 12/26/2014 FINDINGS: There is mild age-related atrophy and chronic microvascular ischemic changes. There is no acute intracranial hemorrhage. No mass effect or midline shift noted. The visualized paranasal sinuses and mastoid air cells are clear. The calvarium is  intact. IMPRESSION: No acute intracranial hemorrhage. Mild age-related atrophy and chronic microvascular ischemic disease. If symptoms persist and there are no contraindications, MRI may provide better evaluation if clinically indicated Electronically Signed   By: Anner Crete M.D.   On: 11/01/2015 21:51   Dg Abd Acute W/chest  11/01/2015  CLINICAL DATA:  Peptic ulcer disease, concern for GI bleed, check for free air. Abdominal pain. EXAM: DG ABDOMEN ACUTE W/ 1V CHEST COMPARISON:  08/22/2015 FINDINGS: Patchy airspace disease in the right upper lobe and right lower lobe, possible pneumonia. This was not present on the recent study. No effusion. Left lung clear. Nonobstructive bowel gas pattern.  No free air. Surgical clips in the gallbladder fossa. Lumbar levoscoliosis. No acute skeletal abnormality. No urinary tract calculi. IMPRESSION: Infiltrate in the right lower lobe and right upper lobe, suspicious for pneumonia Normal bowel gas pattern.  No free air. Electronically Signed   By: Franchot Gallo M.D.   On: 11/01/2015 13:29    Medications: I have reviewed the patient's current medications.  Assessment/Plan: 1. Severe esophagitis probably fungal esophagitis based on endoscopic picture. Fungal smears and biopsies pending. Patient has been placed on empiric Diflucan. We will check on her Sunday to see how she is doing and to confirm that this is in fact fungal esophagitis.   Charde Macfarlane JR,Damacio Weisgerber L 11/03/2015, 12:27 PM  This note was created using voice recognition software. Minor errors may Have occurred unintentionally.  Pager:  [phone] If no answer or after hours call [phone]

## 2015-11-03 NOTE — Progress Notes (Signed)
CSW spoke with pt's daughter, [name], about current discharge plans. Pt is from independent living and pt's daughter would like for her to return with home-health. Pt's daughter stated that she currently already has a home-health aid that comes everyday and she would only need minimal PT assistance.    Kingsley Spittle, [place] Clinical Social Worker [phone]

## 2015-11-04 DIAGNOSIS — D72829 Elevated white blood cell count, unspecified: Secondary | ICD-10-CM

## 2015-11-04 LAB — BASIC METABOLIC PANEL
ANION GAP: 9 (ref 5–15)
BUN: 14 mg/dL (ref 6–20)
CO2: 19 mmol/L — ABNORMAL LOW (ref 22–32)
Calcium: 8.7 mg/dL — ABNORMAL LOW (ref 8.9–10.3)
Chloride: 116 mmol/L — ABNORMAL HIGH (ref 101–111)
Creatinine, Ser: 0.62 mg/dL (ref 0.44–1.00)
GFR calc Af Amer: >60 mL/min (ref >60)
Glucose, Bld: 107 mg/dL — ABNORMAL HIGH (ref 65–99)
POTASSIUM: 3 mmol/L — AB (ref 3.5–5.1)
SODIUM: 144 mmol/L (ref 135–145)

## 2015-11-04 LAB — CBC
HCT: 30.8 % — ABNORMAL LOW (ref 36.0–46.0)
Hemoglobin: 10.6 g/dL — ABNORMAL LOW (ref 12.0–15.0)
MCH: 32.3 pg (ref 26.0–34.0)
MCHC: 34.4 g/dL (ref 30.0–36.0)
MCV: 93.9 fL (ref 78.0–100.0)
PLATELETS: 206 10*3/uL (ref 150–400)
RBC: 3.28 MIL/uL — AB (ref 3.87–5.11)
RDW: 18.1 % — ABNORMAL HIGH (ref 11.5–15.5)
WBC: 20.5 10*3/uL — AB (ref 4.0–10.5)

## 2015-11-04 LAB — GLUCOSE, CAPILLARY
GLUCOSE-CAPILLARY: 241 mg/dL — AB (ref 65–99)
Glucose-Capillary: 113 mg/dL — ABNORMAL HIGH (ref 65–99)
Glucose-Capillary: 133 mg/dL — ABNORMAL HIGH (ref 65–99)
Glucose-Capillary: 172 mg/dL — ABNORMAL HIGH (ref 65–99)

## 2015-11-04 MED ORDER — POTASSIUM CHLORIDE CRYS ER 20 MEQ PO TBCR
40.0000 meq | EXTENDED_RELEASE_TABLET | Freq: Once | ORAL | Status: AC
  Administered 2015-11-04: 40 meq via ORAL
  Filled 2015-11-04: qty 2

## 2015-11-04 NOTE — Progress Notes (Signed)
Speech Language Pathology Treatment: Dysphagia  Patient Details Name: [name] MRN: [id number] DOB: 1941/03/20 Today's Date: 11/04/2015 Time: [id number] SLP Time Calculation (min) (ACUTE ONLY): 10 min  Assessment / Plan / Recommendation Clinical Impression  Pt consumed purees and thin liquids without overt s/s of aspiration across challenging. She does have prolonged oral phase with pt reporting that she has "too much" in her mouth with even small bites. Liquid washes provided to clear. Recommend to continue current diet with SLP f/u for solid advancement.   HPI HPI: 75 year old female who has been in the ALF. She has a history of insulin-dependent type II diabetes, mild dementia, and schizophrenia. She has had previous surgery for breast cancer and apparently did not need chemoradiation therapy back in May. Her main ongoing complaint is chronic back pain. According to her into her daughter she takes a large amount of ibuprofen. She admits to taking ibuprofen at least 2 to 4 times daily for chronic back pain which has been going on for several years. She developed hematemesis at the assisted living facility was brought to the emergency room and was slightly orthostatic with systolic blood pressure 86 in heart rate 132. She was also slightly hypoxic. Her stool was positive for blood was dark. Hemoglobin was 10.2 and drop to 6.7 with hydration. She was profoundly iron deficient. She denies ever having a history of ulcers or previous colonoscopy. She denies EGD. It's notable that she was on Nexium is outpatient for esophageal reflux and presumably she had been taking. She does complain of reflux and epigastric pain. She has no family history: cancer to the best of her knowledge      SLP Plan  Continue with current plan of care     Recommendations  Diet recommendations: Dysphagia 1 (puree);Thin liquid Liquids provided via: Straw;Cup Medication Administration: Whole meds with puree Supervision:  Full supervision/cueing for compensatory strategies;Staff to assist with self feeding Compensations: Slow rate;Small sips/bites;Follow solids with liquid Postural Changes and/or Swallow Maneuvers: Seated upright 90 degrees;Upright 30-60 min after meal             Oral Care Recommendations: Oral care BID Follow up Recommendations: Skilled Nursing facility Plan: Continue with current plan of care     GO               Germain Osgood, M.A. CCC-SLP [phone]  Germain Osgood 11/04/2015, 11:27 AM

## 2015-11-04 NOTE — Progress Notes (Signed)
PROGRESS NOTE                                                                                                                                                                                                             Patient Demographics:    [name], is a 75 y.o. female, DOB - 06/16/1940, [id number]  Admit date - 11/01/2015   Admitting Physician Florencia Reasons, MD  Outpatient Primary MD for the patient is [name], MD  LOS - 3  Outpatient Specialists: none  Chief Complaint  Patient presents with  . Weakness  . GI Bleeding  . Hypotension       Brief Narrative   75 year old female with history of mild dementia, schizophrenia, insulin-dependent type 2 diabetes mellitus, CAD, hypertension, history of breast cysts status post lumpectomy in 08/2015 who is a resident of a left was sent to [facility] ED with several days of coffee-ground emesis and dark stools. As per EMS patient was orthostatic with blood pressure of 86/48 mmHg and heart rate of 132. She was also hypoxic with O2 sat of 87% on room air. FOBT was positive. Patient was septic with leukocytosis and lactic acidosis, acute kidney injury and abdominal x-ray with chest showing right upper and lower lobe infiltrate. Patient admitted to stepdown unit With septic shock. Subsequent hemoglobin dropped to 6.7 and wasTransfused with 2 units PRBC. EGD done showed esophageal candidiasis without ulceration or source of bleeding.   Subjective:   Still reports feeling weak. Denies abdominal pain.   Assessment  & Plan :   Principal problem Acute upper GI bleed No clear source. EGD negative for ulceration or bleed. Hemoglobin improved After transfusion. Continue oral Protonix. Counseled on smoking cessation and avoiding NSAIDs.  Septic shock with acute hypoxic respiratory failure Suspect secondary to multilobar pneumonia and acute GI bleed. Respiratory failure  resolved.. Sepsis resolved except for leukocytosis.  Continue empiric vancomycin and Zosyn. Cultures so far negative.  Esophageal candidiasis Biopsy and cultures sent. Started on fluconazole.  Dysphagia Seen by speech and swallow and recommend dysphagia level I diet with thin liquid.  Insulin-dependent type 2 diabetes mellitus Holding Lantus.  on sliding scale coverage. A1c of 6.  Essential hypertension Blood pressure medications were held due to hypotension on presentation. Now elevated. Resumed home blood pressure medications.  Prolonged QTC  Haldol held. Normal electrolytes.  QTC improved and repeat EKG (491).  Acute kidney injury Suspect prerenal due to septic shock. Resolved with fluids.  Metabolic acidosis  secondary to dehydration/ sepsis with lactic acidosis. Improving.   Code Status : Full code  Family Communication  : Spoke with daughter on the phone on 7/14. See informed that patient takes a lot of over-the-counter medications including Pepto-Bismol and NSAIDs complaining of epigastric pain and acid reflux.  Disposition Plan  : PT recommends SNF however daughter wishes to take patient home with home health. Possibly discharge on 7/17 if continues to improve.  Barriers For Discharge : Improving symptoms.  Consults  :  [facility]  Procedures  : EGD  DVT Prophylaxis  : SCDs  Lab Results  Component Value Date   PLT 206 11/04/2015    Antibiotics  :    Anti-infectives    Start     Dose/Rate Route Frequency Ordered Stop   11/03/15 1000  vancomycin (VANCOCIN) 500 mg in sodium chloride 0.9 % 100 mL IVPB     500 mg 100 mL/hr over 60 Minutes Intravenous Every 12 hours 11/03/15 0920     11/02/15 1400  vancomycin (VANCOCIN) IVPB 750 mg/150 ml premix  Status:  Discontinued     750 mg 150 mL/hr over 60 Minutes Intravenous Every 24 hours 11/01/15 1455 11/03/15 0920   11/02/15 1400  fluconazole (DIFLUCAN) tablet 200 mg     200 mg Oral Daily 11/02/15 1306     11/01/15  2200  piperacillin-tazobactam (ZOSYN) IVPB 3.375 g     3.375 g 12.5 mL/hr over 240 Minutes Intravenous Every 8 hours 11/01/15 1455     11/01/15 1415  piperacillin-tazobactam (ZOSYN) IVPB 3.375 g     3.375 g 100 mL/hr over 30 Minutes Intravenous  Once 11/01/15 1409 11/01/15 1546   11/01/15 1415  vancomycin (VANCOCIN) IVPB 1000 mg/200 mL premix     1,000 mg 200 mL/hr over 60 Minutes Intravenous  Once 11/01/15 1409 11/01/15 1626        Objective:   Filed Vitals:   11/03/15 1332 11/03/15 2147 11/04/15 0444 11/04/15 1105  BP: 176/78 175/68 167/66   Pulse: 103 107 98   Temp: 98.7 F (37.1 C) 99.8 F (37.7 C) 98.8 F (37.1 C) 98.8 F (37.1 C)  TempSrc: Oral Oral Oral Oral  Resp: 20 20 20    Height:      Weight:   72.938 kg (160 lb 12.8 oz)   SpO2: 98% 95% 98%     Wt Readings from Last 3 Encounters:  11/04/15 72.938 kg (160 lb 12.8 oz)  10/04/15 71.079 kg (156 lb 11.2 oz)  09/07/15 71.668 kg (158 lb)     Intake/Output Summary (Last 24 hours) at 11/04/15 1207 Last data filed at 11/04/15 0914  Gross per 24 hour  Intake    475 ml  Output    200 ml  Net    275 ml     Physical Exam  [id number] fatigued, HEENT: moist mucosa, supple neck,  Chest: Coarse breath sounds over right lung, No rhonchi or wheeze CVS: N S1&S2, no murmurs, rubs or gallop GI: soft, NT, ND, BS+ Musculoskeletal: warm, no edema CNS: Alert and oriented    Data Review:    CBC  Recent Labs Lab 11/01/15 1236 11/01/15 1300 11/01/15 1854 11/02/15 0552 11/03/15 0303 11/04/15 0352  WBC 25.6*  --   --  23.0* 21.3* 20.5*  HGB 9.6* 10.2* 8.0* 6.7* 10.0* 10.6*  HCT 28.6* 30.0* 23.6* 20.6* 29.4*  30.8*  PLT 292  --   --  178 220 206  MCV 99.7  --   --  99.5 94.8 93.9  MCH 33.4  --   --  32.4 32.3 32.3  MCHC 33.6  --   --  32.5 34.0 34.4  RDW 17.8*  --   --  18.5* 18.7* 18.1*  LYMPHSABS 1.5  --   --   --   --   --   MONOABS 1.5*  --   --   --   --   --   EOSABS 0.0  --   --   --   --   --     BASOSABS 0.0  --   --   --   --   --     Chemistries   Recent Labs Lab 11/01/15 1236 11/01/15 1300 11/01/15 2000 11/02/15 0243 11/03/15 0303 11/04/15 0352  NA  --  146*  --  145 146* 144  K  --  3.9  --  4.6 3.8 3.0*  CL  --  116*  --  120* 122* 116*  CO2  --   --   --  15* 17* 19*  GLUCOSE  --  157*  --  84 96 107*  BUN  --  52*  --  47* 26* 14  CREATININE  --  1.80*  --  1.36* 0.85 0.62  CALCIUM  --   --   --  9.5 8.9 8.7*  MG  --   --  2.0 2.0  --   --   AST 13*  --   --   --   --   --   ALT 9*  --   --   --   --   --   ALKPHOS 55  --   --   --   --   --   BILITOT 0.3  --   --   --   --   --    ------------------------------------------------------------------------------------------------------------------ No results for input(s): CHOL, HDL, LDLCALC, TRIG, CHOLHDL, LDLDIRECT in the last 72 hours.  Lab Results  Component Value Date   HGBA1C 6.0* 11/01/2015   ------------------------------------------------------------------------------------------------------------------  Recent Labs  11/02/15 0552  TSH 1.037   ------------------------------------------------------------------------------------------------------------------  Recent Labs  11/01/15 1236  VITAMINB12 1341*  FOLATE 16.6  FERRITIN 96  TIBC 202*  IRON 7*  RETICCTPCT 3.1    Coagulation profile  Recent Labs Lab 11/01/15 2318  INR 1.32    No results for input(s): DDIMER in the last 72 hours.  Cardiac Enzymes No results for input(s): CKMB, TROPONINI, MYOGLOBIN in the last 168 hours.  Invalid input(s): CK ------------------------------------------------------------------------------------------------------------------    Component Value Date/Time   BNP 143.0* 12/10/2014 2311    Inpatient Medications  Scheduled Meds: . amLODipine  5 mg Oral Daily  . cloZAPine  150 mg Oral QHS  . donepezil  10 mg Oral QHS  . fluconazole  200 mg Oral Daily  . furosemide  20 mg Oral Daily  .  insulin aspart  0-15 Units Subcutaneous TID WC  . irbesartan  75 mg Oral Daily  . memantine  10 mg Oral BID  . pantoprazole  40 mg Oral Daily  . piperacillin-tazobactam (ZOSYN)  IV  3.375 g Intravenous Q8H  . potassium chloride  40 mEq Oral Once  . sodium chloride flush  3 mL Intravenous Q12H  . vancomycin  500 mg Intravenous Q12H   Continuous Infusions:   PRN Meds:.fluticasone,  HYDROcodone-acetaminophen  Micro Results Recent Results (from the past 240 hour(s))  Blood Culture (routine x 2)     Status: None (Preliminary result)   Collection Time: 11/01/15  2:07 PM  Result Value Ref Range Status   Specimen Description BLOOD BLOOD RIGHT FOREARM  Final   Special Requests IN PEDIATRIC BOTTLE 3 CC  Final   Culture   Final    NO GROWTH 2 DAYS Performed at [facility]    Report Status PENDING  Incomplete  Urine culture     Status: Abnormal   Collection Time: 11/01/15  3:54 PM  Result Value Ref Range Status   Specimen Description URINE, CATHETERIZED  Final   Special Requests NONE  Final   Culture (A)  Final    1,000 COLONIES/mL INSIGNIFICANT GROWTH Performed at [facility]    Report Status 11/02/2015 FINAL  Final  MRSA PCR Screening     Status: None   Collection Time: 11/01/15  5:27 PM  Result Value Ref Range Status   MRSA by PCR NEGATIVE NEGATIVE Final    Comment:        The GeneXpert MRSA Assay (FDA approved for NASAL specimens only), is one component of a comprehensive MRSA colonization surveillance program. It is not intended to diagnose MRSA infection nor to guide or monitor treatment for MRSA infections.   Culture, blood (routine x 2)     Status: None (Preliminary result)   Collection Time: 11/01/15  8:05 PM  Result Value Ref Range Status   Specimen Description BLOOD LEFT HAND  Final   Special Requests IN PEDIATRIC BOTTLE 3CC  Final   Culture   Final    NO GROWTH 2 DAYS Performed at [facility]    Report Status PENDING  Incomplete    Culture, blood (routine x 2)     Status: None (Preliminary result)   Collection Time: 11/01/15  8:17 PM  Result Value Ref Range Status   Specimen Description BLOOD LEFT ANTECUBITAL  Final   Special Requests IN PEDIATRIC BOTTLE 2CC  Final   Culture   Final    NO GROWTH 2 DAYS Performed at [facility]    Report Status PENDING  Incomplete    Radiology Reports Ct Head Wo Contrast  11/01/2015  CLINICAL DATA:  75 year old female with confusion EXAM: CT HEAD WITHOUT CONTRAST TECHNIQUE: Contiguous axial images were obtained from the base of the skull through the vertex without intravenous contrast. COMPARISON:  CT dated 12/26/2014 FINDINGS: There is mild age-related atrophy and chronic microvascular ischemic changes. There is no acute intracranial hemorrhage. No mass effect or midline shift noted. The visualized paranasal sinuses and mastoid air cells are clear. The calvarium is intact. IMPRESSION: No acute intracranial hemorrhage. Mild age-related atrophy and chronic microvascular ischemic disease. If symptoms persist and there are no contraindications, MRI may provide better evaluation if clinically indicated Electronically Signed   By: Anner Crete M.D.   On: 11/01/2015 21:51   Dg Abd Acute W/chest  11/01/2015  CLINICAL DATA:  Peptic ulcer disease, concern for GI bleed, check for free air. Abdominal pain. EXAM: DG ABDOMEN ACUTE W/ 1V CHEST COMPARISON:  08/22/2015 FINDINGS: Patchy airspace disease in the right upper lobe and right lower lobe, possible pneumonia. This was not present on the recent study. No effusion. Left lung clear. Nonobstructive bowel gas pattern.  No free air. Surgical clips in the gallbladder fossa. Lumbar levoscoliosis. No acute skeletal abnormality. No urinary tract calculi. IMPRESSION: Infiltrate in the right  lower lobe and right upper lobe, suspicious for pneumonia Normal bowel gas pattern.  No free air. Electronically Signed   By: Franchot Gallo M.D.   On:  11/01/2015 13:29    Time Spent in minutes  25   Louellen Molder M.D on 11/04/2015 at 12:07 PM  Between 7am to 7pm - Pager - [phone]  After 7pm go to [email or web address] - password [facility]  [facility] -  Office  [phone]

## 2015-11-05 ENCOUNTER — Inpatient Hospital Stay (HOSPITAL_COMMUNITY): Payer: Medicare Other

## 2015-11-05 ENCOUNTER — Encounter (HOSPITAL_COMMUNITY): Payer: Self-pay | Admitting: Certified Registered"

## 2015-11-05 DIAGNOSIS — K253 Acute gastric ulcer without hemorrhage or perforation: Secondary | ICD-10-CM

## 2015-11-05 DIAGNOSIS — J690 Pneumonitis due to inhalation of food and vomit: Secondary | ICD-10-CM

## 2015-11-05 LAB — CBC
HEMATOCRIT: 29.5 % — AB (ref 36.0–46.0)
Hemoglobin: 9.7 g/dL — ABNORMAL LOW (ref 12.0–15.0)
MCH: 31.5 pg (ref 26.0–34.0)
MCHC: 32.9 g/dL (ref 30.0–36.0)
MCV: 95.8 fL (ref 78.0–100.0)
PLATELETS: 202 10*3/uL (ref 150–400)
RBC: 3.08 MIL/uL — ABNORMAL LOW (ref 3.87–5.11)
RDW: 17.6 % — AB (ref 11.5–15.5)
WBC: 16.5 10*3/uL — ABNORMAL HIGH (ref 4.0–10.5)

## 2015-11-05 LAB — GLUCOSE, CAPILLARY
GLUCOSE-CAPILLARY: 95 mg/dL (ref 65–99)
Glucose-Capillary: 120 mg/dL — ABNORMAL HIGH (ref 65–99)
Glucose-Capillary: 127 mg/dL — ABNORMAL HIGH (ref 65–99)
Glucose-Capillary: 181 mg/dL — ABNORMAL HIGH (ref 65–99)
Glucose-Capillary: 93 mg/dL (ref 65–99)

## 2015-11-05 LAB — VANCOMYCIN, TROUGH: VANCOMYCIN TR: 10 ug/mL — AB (ref 15–20)

## 2015-11-05 MED ORDER — SODIUM CHLORIDE 0.9% FLUSH: 10.0000 mL | INTRAVENOUS | Status: DC | PRN

## 2015-11-05 MED ORDER — SODIUM CHLORIDE 0.9% FLUSH
10.0000 mL | Freq: Two times a day (BID) | INTRAVENOUS | Status: DC
  Administered 2015-11-06: 10 mL

## 2015-11-05 MED ORDER — VANCOMYCIN HCL IN DEXTROSE 750-5 MG/150ML-% IV SOLN
750.0000 mg | Freq: Two times a day (BID) | INTRAVENOUS | Status: DC
  Administered 2015-11-05: 750 mg via INTRAVENOUS
  Filled 2015-11-05 (×4): qty 150

## 2015-11-05 MED ORDER — VANCOMYCIN HCL 500 MG IV SOLR
500.0000 mg | Freq: Once | INTRAVENOUS | Status: AC
  Administered 2015-11-05: 500 mg via INTRAVENOUS
  Filled 2015-11-05: qty 500

## 2015-11-05 MED ORDER — FUROSEMIDE 10 MG/ML IJ SOLN
60.0000 mg | Freq: Once | INTRAMUSCULAR | Status: AC
  Administered 2015-11-05: 60 mg via INTRAVENOUS
  Filled 2015-11-05: qty 6

## 2015-11-05 NOTE — Progress Notes (Signed)
MD notified regarding difficulty starting iv. Pt has pink restricted extremity arm band in place on left side.  History in chart reveals pt had breast cancer with a lumpectomy and a sentinel node biopsy, however no other lymph nodes were removed.  Per MD it is ok to unrestrict the left side and use it to obtain IV access.

## 2015-11-05 NOTE — Progress Notes (Signed)
Swallowing ok. No complaints. Her BX from esophagus shows ulcer, no mention of fungus. Brushing and culture pending from what I see. Continue PPI and Diflucan (for now). We will sign off. Call if needed.

## 2015-11-05 NOTE — Progress Notes (Signed)
Peripherally Inserted Central Catheter/Midline Placement  The IV Nurse has discussed with the patient and/or persons authorized to consent for the patient, the purpose of this procedure and the potential benefits and risks involved with this procedure.  The benefits include less needle sticks, lab draws from the catheter, ability to perform PICC exchange if ordered by the physician and patient may be discharged home with the catheter.  Risks include, but not limited to, infection, bleeding, blood clot (thrombus formation), and puncture of an artery; nerve damage and irregular heat beat.  Alternatives to this procedure were also discussed.  Bard educational packet left at bedside for family and pt.  Dtr gave telephone consent for PICC placement  PICC/Midline Placement Documentation  PICC Single Lumen [id number] PICC Right Basilic 35 cm 0 cm (Active)  Indication for Insertion or Continuance of Line Limited venous access - need for IV therapy >5 days (PICC only);Poor Vasculature-patient has had multiple peripheral attempts or PIVs lasting less than 24 hours 11/05/2015  3:44 PM  Exposed Catheter (cm) 0 cm 11/05/2015  3:44 PM  Site Assessment Clean;Dry;Intact 11/05/2015  3:44 PM  Line Status Flushed;Saline locked;Blood return noted 11/05/2015  3:44 PM  Dressing Type Transparent 11/05/2015  3:44 PM  Dressing Status Clean;Dry;Intact;Antimicrobial disc in place 11/05/2015  3:44 PM  Line Care Connections checked and tightened 11/05/2015  3:44 PM  Line Adjustment (NICU/IV Team Only) No 11/05/2015  3:44 PM  Dressing Intervention New dressing 11/05/2015  3:44 PM  Dressing Change Due 11/12/15 11/05/2015  3:44 PM       Rolena Infante 11/05/2015, 3:45 PM

## 2015-11-05 NOTE — Progress Notes (Addendum)
PROGRESS NOTE                                                                                                                                                                                                             Patient Demographics:    [name], is a 75 y.o. female, DOB - 05/14/1940, [id number]  Admit date - 11/01/2015   Admitting Physician Florencia Reasons, MD  Outpatient Primary MD for the patient is Jane Amel, MD  LOS - 4  Outpatient Specialists: none  Chief Complaint  Patient presents with  . Weakness  . GI Bleeding  . Hypotension       Brief Narrative   75 year old female with history of mild dementia, schizophrenia, insulin-dependent type 2 diabetes mellitus, CAD, hypertension, history of breast cysts status post lumpectomy in 08/2015 who is a resident of a left was sent to [facility] ED with several days of coffee-ground emesis and dark stools. As per EMS patient was orthostatic with blood pressure of 86/48 mmHg and heart rate of 132. She was also hypoxic with O2 sat of 87% on room air. FOBT was positive. Patient was septic with leukocytosis and lactic acidosis, acute kidney injury and abdominal x-ray with chest showing right upper and lower lobe infiltrate. Patient admitted to stepdown unit With septic shock. Subsequent hemoglobin dropped to 6.7 and wasTransfused with 2 units PRBC. EGD done showed esophageal candidiasis without ulceration or source of bleeding.   Subjective:   Pt febrile to 100.7 overnight   Assessment  & Plan :   Principal problem Acute upper GI bleed . EGD negative for ulceration or bleed. Surgical pathology shows an ulcer.( may e the source for bleeding) Fungal culture pending. Hemoglobin improved after transfusion. Continue oral Protonix. Counseled on smoking cessation and avoiding NSAIDs.  Septic shock with acute hypoxic respiratory failure secondary to multilobar pneumonia.  Suspect aspiration. Had fever overnight. Two-view chest x-ray showing progressive asymmetry bilaterally is disease with pleural effusions. Will order IV Lasix. Wbc improving. Continue empiric antibiotics. Cultures so far negative. Continue nebs.  Esophageal candidiasis Biopsy shows ulcer.  cultures sent. Started on fluconazole.  Dysphagia Seen by speech and swallow and recommend dysphagia level I diet with thin liquid. Given worsened PNA will keep her NPO and have SLP see her again.  Insulin-dependent type 2 diabetes mellitus Holding Lantus.  on  sliding scale coverage. A1c of 6.  Essential hypertension Blood pressure medications were held due to hypotension on presentation. Now elevated. Resumed home blood pressure medications.  Prolonged QTC  Haldol held. Normal electrolytes. QTC improved and repeat EKG (491).  Acute kidney injury Suspect prerenal due to septic shock. Resolved with fluids.  Metabolic acidosis  secondary to dehydration/ sepsis with lactic acidosis. Improving.  Poor IV access: PICC line ordered   Code Status : Full code  Family Communication  : Spoke with daughter on the phone on 7/14.  Informed me  that patient takes a lot of over-the-counter medications including Pepto-Bismol and NSAIDs complaining of epigastric pain and acid reflux.  Disposition Plan  : PT recommends SNF however daughter wishes to take patient home with home health. Patient still symptomatic.  Barriers For Discharge : Persistent symptoms.  Consults  :  [facility]  Procedures  : EGD  DVT Prophylaxis  : SCDs  Lab Results  Component Value Date   PLT 202 11/05/2015    Antibiotics  :    Anti-infectives    Start     Dose/Rate Route Frequency Ordered Stop   11/05/15 2200  vancomycin (VANCOCIN) IVPB 750 mg/150 ml premix     750 mg 150 mL/hr over 60 Minutes Intravenous Every 12 hours 11/05/15 1119     11/05/15 1130  vancomycin (VANCOCIN) 500 mg in sodium chloride 0.9 % 100 mL IVPB     500  mg 100 mL/hr over 60 Minutes Intravenous  Once 11/05/15 1119     11/03/15 1000  vancomycin (VANCOCIN) 500 mg in sodium chloride 0.9 % 100 mL IVPB  Status:  Discontinued     500 mg 100 mL/hr over 60 Minutes Intravenous Every 12 hours 11/03/15 0920 11/05/15 1119   11/02/15 1400  vancomycin (VANCOCIN) IVPB 750 mg/150 ml premix  Status:  Discontinued     750 mg 150 mL/hr over 60 Minutes Intravenous Every 24 hours 11/01/15 1455 11/03/15 0920   11/02/15 1400  fluconazole (DIFLUCAN) tablet 200 mg     200 mg Oral Daily 11/02/15 1306     11/01/15 2200  piperacillin-tazobactam (ZOSYN) IVPB 3.375 g     3.375 g 12.5 mL/hr over 240 Minutes Intravenous Every 8 hours 11/01/15 1455     11/01/15 1415  piperacillin-tazobactam (ZOSYN) IVPB 3.375 g     3.375 g 100 mL/hr over 30 Minutes Intravenous  Once 11/01/15 1409 11/01/15 1546   11/01/15 1415  vancomycin (VANCOCIN) IVPB 1000 mg/200 mL premix     1,000 mg 200 mL/hr over 60 Minutes Intravenous  Once 11/01/15 1409 11/01/15 1626        Objective:   Filed Vitals:   11/04/15 1105 11/04/15 1409 11/04/15 2039 11/05/15 0520  BP:  177/72 163/71 145/58  Pulse:  107 102 87  Temp: 98.8 F (37.1 C) 98.2 F (36.8 C) 100.7 F (38.2 C) 99.9 F (37.7 C)  TempSrc: Oral Oral Oral Axillary  Resp:  22 32 24  Height:      Weight:      SpO2:  98% 95% 97%    Wt Readings from Last 3 Encounters:  11/04/15 72.938 kg (160 lb 12.8 oz)  10/04/15 71.079 kg (156 lb 11.2 oz)  09/07/15 71.668 kg (158 lb)     Intake/Output Summary (Last 24 hours) at 11/05/15 1159 Last data filed at 11/05/15 1026  Gross per 24 hour  Intake   1590 ml  Output      0 ml  Net  1590 ml     Physical Exam  [id number] to be sleepy with some drooling on the side. arousable easily HEENT: moist mucosa, supple neck,  Chest: Coarse breath sounds over right lung, No rhonchi or wheeze CVS: N S1&S2, no murmurs, rubs or gallop GI: soft, NT, ND, BS+ Musculoskeletal: warm, no  edema CNS: Alert and oriented    Data Review:    CBC  Recent Labs Lab 11/01/15 1236  11/01/15 1854 11/02/15 0552 11/03/15 0303 11/04/15 0352 11/05/15 0408  WBC 25.6*  --   --  23.0* 21.3* 20.5* 16.5*  HGB 9.6*  < > 8.0* 6.7* 10.0* 10.6* 9.7*  HCT 28.6*  < > 23.6* 20.6* 29.4* 30.8* 29.5*  PLT 292  --   --  178 220 206 202  MCV 99.7  --   --  99.5 94.8 93.9 95.8  MCH 33.4  --   --  32.4 32.3 32.3 31.5  MCHC 33.6  --   --  32.5 34.0 34.4 32.9  RDW 17.8*  --   --  18.5* 18.7* 18.1* 17.6*  LYMPHSABS 1.5  --   --   --   --   --   --   MONOABS 1.5*  --   --   --   --   --   --   EOSABS 0.0  --   --   --   --   --   --   BASOSABS 0.0  --   --   --   --   --   --   < > = values in this interval not displayed.  Chemistries   Recent Labs Lab 11/01/15 1236 11/01/15 1300 11/01/15 2000 11/02/15 0243 11/03/15 0303 11/04/15 0352  NA  --  146*  --  145 146* 144  K  --  3.9  --  4.6 3.8 3.0*  CL  --  116*  --  120* 122* 116*  CO2  --   --   --  15* 17* 19*  GLUCOSE  --  157*  --  84 96 107*  BUN  --  52*  --  47* 26* 14  CREATININE  --  1.80*  --  1.36* 0.85 0.62  CALCIUM  --   --   --  9.5 8.9 8.7*  MG  --   --  2.0 2.0  --   --   AST 13*  --   --   --   --   --   ALT 9*  --   --   --   --   --   ALKPHOS 55  --   --   --   --   --   BILITOT 0.3  --   --   --   --   --    ------------------------------------------------------------------------------------------------------------------ No results for input(s): CHOL, HDL, LDLCALC, TRIG, CHOLHDL, LDLDIRECT in the last 72 hours.  Lab Results  Component Value Date   HGBA1C 6.0* 11/01/2015   ------------------------------------------------------------------------------------------------------------------ No results for input(s): TSH, T4TOTAL, T3FREE, THYROIDAB in the last 72 hours.  Invalid input(s): FREET3 ------------------------------------------------------------------------------------------------------------------ No  results for input(s): VITAMINB12, FOLATE, FERRITIN, TIBC, IRON, RETICCTPCT in the last 72 hours.  Coagulation profile  Recent Labs Lab 11/01/15 2318  INR 1.32    No results for input(s): DDIMER in the last 72 hours.  Cardiac Enzymes No results for input(s): CKMB, TROPONINI, MYOGLOBIN in the last 168 hours.  Invalid input(s): CK ------------------------------------------------------------------------------------------------------------------  Component Value Date/Time   BNP 143.0* 12/10/2014 2311    Inpatient Medications  Scheduled Meds: . amLODipine  5 mg Oral Daily  . cloZAPine  150 mg Oral QHS  . donepezil  10 mg Oral QHS  . fluconazole  200 mg Oral Daily  . furosemide  60 mg Intravenous Once  . furosemide  20 mg Oral Daily  . insulin aspart  0-15 Units Subcutaneous TID WC  . irbesartan  75 mg Oral Daily  . memantine  10 mg Oral BID  . pantoprazole  40 mg Oral Daily  . piperacillin-tazobactam (ZOSYN)  IV  3.375 g Intravenous Q8H  . sodium chloride flush  3 mL Intravenous Q12H  . vancomycin  500 mg Intravenous Once  . vancomycin  750 mg Intravenous Q12H   Continuous Infusions:   PRN Meds:.fluticasone, HYDROcodone-acetaminophen  Micro Results Recent Results (from the past 240 hour(s))  Blood Culture (routine x 2)     Status: None (Preliminary result)   Collection Time: 11/01/15  2:07 PM  Result Value Ref Range Status   Specimen Description BLOOD BLOOD RIGHT FOREARM  Final   Special Requests IN PEDIATRIC BOTTLE 3 CC  Final   Culture   Final    NO GROWTH 3 DAYS Performed at [facility]    Report Status PENDING  Incomplete  Urine culture     Status: Abnormal   Collection Time: 11/01/15  3:54 PM  Result Value Ref Range Status   Specimen Description URINE, CATHETERIZED  Final   Special Requests NONE  Final   Culture (A)  Final    1,000 COLONIES/mL INSIGNIFICANT GROWTH Performed at [facility]    Report Status 11/02/2015 FINAL  Final  MRSA  PCR Screening     Status: None   Collection Time: 11/01/15  5:27 PM  Result Value Ref Range Status   MRSA by PCR NEGATIVE NEGATIVE Final    Comment:        The GeneXpert MRSA Assay (FDA approved for NASAL specimens only), is one component of a comprehensive MRSA colonization surveillance program. It is not intended to diagnose MRSA infection nor to guide or monitor treatment for MRSA infections.   Culture, blood (routine x 2)     Status: None (Preliminary result)   Collection Time: 11/01/15  8:05 PM  Result Value Ref Range Status   Specimen Description BLOOD LEFT HAND  Final   Special Requests IN PEDIATRIC BOTTLE 3CC  Final   Culture   Final    NO GROWTH 3 DAYS Performed at [facility]    Report Status PENDING  Incomplete  Culture, blood (routine x 2)     Status: None (Preliminary result)   Collection Time: 11/01/15  8:17 PM  Result Value Ref Range Status   Specimen Description BLOOD LEFT ANTECUBITAL  Final   Special Requests IN PEDIATRIC BOTTLE [place]  Final   Culture   Final    NO GROWTH 3 DAYS Performed at [facility]    Report Status PENDING  Incomplete    Radiology Reports Dg Chest 2 View  11/05/2015  CLINICAL DATA:  Lethargy. EXAM: CHEST  2 VIEW COMPARISON:  11/01/2015 FINDINGS: AP and lateral views of the chest show bilateral asymmetric airspace disease, right mid lung and retrocardiac left base with probable small pleural effusions. Disease is progressive in the interval. The cardiopericardial silhouette is within normal limits for size. The visualized bony structures of the thorax are intact. IMPRESSION: Progression of asymmetric bilateral  airspace disease with pleural effusions. Asymmetric edema versus diffuse infection. Electronically Signed   By: Misty Stanley M.D.   On: 11/05/2015 11:07   Ct Head Wo Contrast  11/01/2015  CLINICAL DATA:  75 year old female with confusion EXAM: CT HEAD WITHOUT CONTRAST TECHNIQUE: Contiguous axial images were obtained  from the base of the skull through the vertex without intravenous contrast. COMPARISON:  CT dated 12/26/2014 FINDINGS: There is mild age-related atrophy and chronic microvascular ischemic changes. There is no acute intracranial hemorrhage. No mass effect or midline shift noted. The visualized paranasal sinuses and mastoid air cells are clear. The calvarium is intact. IMPRESSION: No acute intracranial hemorrhage. Mild age-related atrophy and chronic microvascular ischemic disease. If symptoms persist and there are no contraindications, MRI may provide better evaluation if clinically indicated Electronically Signed   By: Anner Crete M.D.   On: 11/01/2015 21:51   Dg Abd Acute W/chest  11/01/2015  CLINICAL DATA:  Peptic ulcer disease, concern for GI bleed, check for free air. Abdominal pain. EXAM: DG ABDOMEN ACUTE W/ 1V CHEST COMPARISON:  08/22/2015 FINDINGS: Patchy airspace disease in the right upper lobe and right lower lobe, possible pneumonia. This was not present on the recent study. No effusion. Left lung clear. Nonobstructive bowel gas pattern.  No free air. Surgical clips in the gallbladder fossa. Lumbar levoscoliosis. No acute skeletal abnormality. No urinary tract calculi. IMPRESSION: Infiltrate in the right lower lobe and right upper lobe, suspicious for pneumonia Normal bowel gas pattern.  No free air. Electronically Signed   By: Franchot Gallo M.D.   On: 11/01/2015 13:29    Time Spent in minutes  25   Louellen Molder M.D on 11/05/2015 at 11:59 AM  Between 7am to 7pm - Pager - [phone]  After 7pm go to [email or web address] - password [facility]  [facility] -  Office  [phone]

## 2015-11-05 NOTE — Progress Notes (Signed)
SLP Cancellation Note  Patient Details Name: [name] MRN: [id number] DOB: 06-11-1940   Cancelled treatment:       Reason Eval/Treat Not Completed: Patient at procedure or test/unavailable; ST to re assess as soon as possible   Arvil Chaco MA, [place] Pathologist    Levi Aland 11/05/2015, 3:18 PM

## 2015-11-05 NOTE — Progress Notes (Signed)
Pharmacy Antibiotic Note  [name] is a 75 y.o. female admitted on 11/01/2015 with c/o weakness, hypotension and GIB.  CXR indicated infiltrate in the right low lobe and right upper lobe with abx started on admission for broad empiric coverage for suspected PNA.  S/p EGD on 7/13 that showed severe esophagitis with fluconazole started on 7/13 per GI.  Today, 11/05/2015: - day #4 of abx - Tmax 100.7, wbc trending down - scr trending down 0.62 (crcl~55) - vancomycin trough level now back sub-therapeutic at 10 (goal 15-20)   Plan: -  vanc 500 mg IV x1, then increase to vancomycin 750 mg IV q12h  - Zosyn 3.375 gr IV q8h EI.   ___________________________________  Height: 5' (152.4 cm) Weight: 160 lb 12.8 oz (72.938 kg) IBW/kg (Calculated) : 45.5  Temp (24hrs), Avg:99.6 F (37.6 C), Min:98.2 F (36.8 C), Max:100.7 F (38.2 C)   Recent Labs Lab 11/01/15 1236 11/01/15 1300  11/01/15 1903 11/01/15 2320 11/02/15 0243 11/02/15 0552 11/02/15 1615 11/02/15 1827 11/03/15 0303 11/04/15 0352 11/05/15 0408 11/05/15 0941  WBC 25.6*  --   --   --   --   --  23.0*  --   --  21.3* 20.5* 16.5*  --   CREATININE  --  1.80*  --   --   --  1.36*  --   --   --  0.85 0.62  --   --   LATICACIDVEN  --  2.30*  < > 4.7* 2.9* 3.0*  --  2.2* 1.9  --   --   --   --   VANCOTROUGH  --   --   --   --   --   --   --   --   --   --   --   --  10*  < > = values in this interval not displayed.  Estimated Creatinine Clearance: 55 mL/min (by C-G formula based on Cr of 0.62).    Allergies  Allergen Reactions  . Codeine Phosphate Other (See Comments)    REACTION: unspecified  . Glimepiride Other (See Comments)    REACTION: unspecified  . Glyburide Other (See Comments)    REACTION: unspecified  . Levofloxacin Nausea Only    Made her weak and nauseated  . Shellfish Allergy Swelling  . Tramadol Other (See Comments)    unknown   Antimicrobials this admission: 7/12 vancomycin >>   7/12 Zosyn >>   7/13 fluconazole (candida esophagitis)>> (GI said to treat for 5 days)  Dose adjustments this admission: 7/14 changed vanc to 500 mg q12h d/t improving renal funct 7/16 VT at 0930 = 10 (on 500 mg q12h) --> 500 mg x1, then change to 750 mg q12h  Microbiology results: 7/12 BCx x2 (at 2000): 7/12 [place] (at 1400): 7/12 UCx: insignif growth FINAL 7/12 MRSA PCR (-) 7/13 HIV antb (-)   Thank you for allowing pharmacy to be a part of this patient's care.  Dia Sitter, PharmD, BCPS 11/05/2015 11:13 AM

## 2015-11-06 LAB — BASIC METABOLIC PANEL
Anion gap: 9 (ref 5–15)
BUN: 9 mg/dL (ref 6–20)
CHLORIDE: 111 mmol/L (ref 101–111)
CO2: 24 mmol/L (ref 22–32)
CREATININE: 0.83 mg/dL (ref 0.44–1.00)
Calcium: 9 mg/dL (ref 8.9–10.3)
GFR calc Af Amer: >60 mL/min (ref >60)
GFR calc non Af Amer: >60 mL/min (ref >60)
GLUCOSE: 107 mg/dL — AB (ref 65–99)
POTASSIUM: 3.3 mmol/L — AB (ref 3.5–5.1)
Sodium: 144 mmol/L (ref 135–145)

## 2015-11-06 LAB — CULTURE, BLOOD (ROUTINE X 2)
CULTURE: NO GROWTH
Culture: NO GROWTH
Culture: NO GROWTH

## 2015-11-06 LAB — GLUCOSE, CAPILLARY
Glucose-Capillary: 107 mg/dL — ABNORMAL HIGH (ref 65–99)
Glucose-Capillary: 104 mg/dL — ABNORMAL HIGH (ref 65–99)
Glucose-Capillary: 89 mg/dL (ref 65–99)
Glucose-Capillary: 101 mg/dL — ABNORMAL HIGH (ref 65–99)
Glucose-Capillary: 221 mg/dL — ABNORMAL HIGH (ref 65–99)

## 2015-11-06 LAB — CBC
HEMATOCRIT: 30.5 % — AB (ref 36.0–46.0)
HEMOGLOBIN: 10.3 g/dL — AB (ref 12.0–15.0)
MCH: 32.4 pg (ref 26.0–34.0)
MCHC: 33.8 g/dL (ref 30.0–36.0)
MCV: 95.9 fL (ref 78.0–100.0)
Platelets: 260 10*3/uL (ref 150–400)
RBC: 3.18 MIL/uL — ABNORMAL LOW (ref 3.87–5.11)
RDW: 17.4 % — ABNORMAL HIGH (ref 11.5–15.5)
WBC: 17.1 10*3/uL — ABNORMAL HIGH (ref 4.0–10.5)

## 2015-11-06 MED ORDER — MAGNESIUM SULFATE 2 GM/50ML IV SOLN
2.0000 g | Freq: Once | INTRAVENOUS | Status: AC
  Administered 2015-11-06: 2 g via INTRAVENOUS
  Filled 2015-11-06: qty 50

## 2015-11-06 MED ORDER — HALOPERIDOL 5 MG PO TABS: 5.0000 mg | ORAL_TABLET | Freq: Two times a day (BID) | ORAL | Status: DC

## 2015-11-06 MED ORDER — POTASSIUM CHLORIDE CRYS ER 20 MEQ PO TBCR
40.0000 meq | EXTENDED_RELEASE_TABLET | Freq: Once | ORAL | Status: AC
  Administered 2015-11-06: 40 meq via ORAL
  Filled 2015-11-06: qty 2

## 2015-11-06 MED ORDER — POTASSIUM CHLORIDE CRYS ER 20 MEQ PO TBCR
40.0000 meq | EXTENDED_RELEASE_TABLET | ORAL | Status: AC
  Administered 2015-11-06 (×2): 40 meq via ORAL
  Filled 2015-11-06 (×2): qty 2

## 2015-11-06 MED ORDER — SODIUM CHLORIDE 0.9 % IV SOLN
3.0000 g | Freq: Four times a day (QID) | INTRAVENOUS | Status: AC
  Administered 2015-11-06 – 2015-11-08 (×11): 3 g via INTRAVENOUS
  Filled 2015-11-06 (×12): qty 3

## 2015-11-06 MED ORDER — CETYLPYRIDINIUM CHLORIDE 0.05 % MT LIQD
7.0000 mL | Freq: Two times a day (BID) | OROMUCOSAL | Status: DC
  Administered 2015-11-06 – 2015-11-09 (×4): 7 mL via OROMUCOSAL

## 2015-11-06 MED ORDER — HALOPERIDOL 5 MG PO TABS
5.0000 mg | ORAL_TABLET | Freq: Every morning | ORAL | Status: DC
  Administered 2015-11-06 – 2015-11-09 (×4): 5 mg via ORAL
  Filled 2015-11-06 (×4): qty 1

## 2015-11-06 MED ORDER — CHLORHEXIDINE GLUCONATE 0.12 % MT SOLN
15.0000 mL | Freq: Two times a day (BID) | OROMUCOSAL | Status: DC
  Administered 2015-11-06 – 2015-11-08 (×6): 15 mL via OROMUCOSAL
  Filled 2015-11-06 (×6): qty 15

## 2015-11-06 MED ORDER — HALOPERIDOL 5 MG PO TABS
10.0000 mg | ORAL_TABLET | Freq: Every day | ORAL | Status: DC
  Administered 2015-11-06 – 2015-11-08 (×3): 10 mg via ORAL
  Filled 2015-11-06 (×3): qty 2

## 2015-11-06 NOTE — Progress Notes (Addendum)
Physical Therapy Treatment Patient Details Name: [name] MRN: [id number] DOB: [birth year] Today's Date: 11/06/2015    History of Present Illness 75 year old female with history of mild dementia, schizophrenia, insulin-dependent type 2 diabetes mellitus, CAD, hypertension, history of breast cysts status post lumpectomy in 08/2015 and admitted for acute upper GI bleed and septic shock.    PT Comments    Pt initially sleepy/groggy.  Increased time to arouse.  Assisted OOB to [facility] then amb a limited distance in hallway.  Positioned in recliner.    Follow Up Recommendations  SNF     Equipment Recommendations       Recommendations for Other Services       Precautions / Restrictions Precautions Precautions: Fall Restrictions Weight Bearing Restrictions: No    Mobility  Bed Mobility Overal bed mobility: Needs Assistance Bed Mobility: Supine to Sit     Supine to sit: Mod assist;Max assist     General bed mobility comments: repeat functional VC's to complete task.  Increased time to initiate but demonstarted increased self ability.    Transfers Overall transfer level: Needs assistance Equipment used: None Transfers: Stand Pivot Transfers Sit to Stand: Min assist;Mod assist         General transfer comment: 50% VC's on proper hand placement and increased time to encourage self participation.  Assisted from elevated bed to [facility].  Assisted with sit to stand + 2 assist for safety and peri care.    Ambulation/Gait Ambulation/Gait assistance: Mod assist;+2 physical assistance;+2 safety/equipment Ambulation Distance (Feet): 14 Feet Assistive device: Rolling walker (2 wheeled) Gait Pattern/deviations: Step-to pattern;Step-through pattern;Decreased step length - right;Decreased step length - left;Shuffle Gait velocity: decreased   General Gait Details: + 2 assist with recliner following for safety.  Pt tolerated increased distance.  Mad c/o low back pain (chronic) and  unsteady shaky gait which increases with fatigue.  HIGH FALL RISK.   Stairs            Wheelchair Mobility    Modified Rankin (Stroke Patients Only)       Balance                                    Cognition Arousal/Alertness: Awake/alert Behavior During Therapy: WFL for tasks assessed/performed Overall Cognitive Status: Within Functional Limits for tasks assessed                      Exercises      General Comments        Pertinent Vitals/Pain Pain Assessment: No/denies pain    Home Living                      Prior Function            PT Goals (current goals can now be found in the care plan section) Progress towards PT goals: Progressing toward goals    Frequency  Min 3X/week    PT Plan Current plan remains appropriate    Co-evaluation             End of Session Equipment Utilized During Treatment: Gait belt Activity Tolerance: Patient tolerated treatment well Patient left: in chair;with call bell/phone within reach;with chair alarm set     Time: 1010-1043 PT Time Calculation (min) (ACUTE ONLY): 33 min  Charges:  $Gait Training: 8-22 mins $Therapeutic Activity: 8-22 mins  G Codes:      Rica Koyanagi  PTA [facility]  Acute  Rehab Pager      [phone]

## 2015-11-06 NOTE — Progress Notes (Signed)
Pharmacy Antibiotic Note  [name] is a 75 y.o. female admitted on 11/01/2015 with c/o weakness, hypotension and GIB. CXR indicated infiltrate in the right low lobe and right upper lobe with abx started on admission for broad empiric coverage for suspected PNA.  Pharmacy now has been consulted for unasyn dosing.  Plan: unasyn 3gm IV q6h for CrCl > 57mls/min Follow renal function  Height: 5' (152.4 cm) Weight: 160 lb 12.8 oz (72.938 kg) IBW/kg (Calculated) : 45.5  Temp (24hrs), Avg:99 F (37.2 C), Min:97.9 F (36.6 C), Max:99.9 F (37.7 C)   Recent Labs Lab 11/01/15 1300  11/01/15 1903 11/01/15 2320 11/02/15 0243 11/02/15 0552 11/02/15 1615 11/02/15 1827 11/03/15 0303 11/04/15 0352 11/05/15 0408 11/05/15 0941 11/06/15 0515  WBC  --   --   --   --   --  23.0*  --   --  21.3* 20.5* 16.5*  --  17.1*  CREATININE 1.80*  --   --   --  1.36*  --   --   --  0.85 0.62  --   --  0.83  LATICACIDVEN 2.30*  < > 4.7* 2.9* 3.0*  --  2.2* 1.9  --   --   --   --   --   VANCOTROUGH  --   --   --   --   --   --   --   --   --   --   --  10*  --   < > = values in this interval not displayed.  Estimated Creatinine Clearance: 53 mL/min (by C-G formula based on Cr of 0.83).    Allergies  Allergen Reactions  . Codeine Phosphate Other (See Comments)    REACTION: unspecified  . Glimepiride Other (See Comments)    REACTION: unspecified  . Glyburide Other (See Comments)    REACTION: unspecified  . Levofloxacin Nausea Only    Made her weak and nauseated  . Shellfish Allergy Swelling  . Tramadol Other (See Comments)    unknown    Antimicrobials this admission: 7/12 vancomycin >> 7/17 7/12 Zosyn >> 7/17 7/13 fluconazole (candida esophagitis)>> 7/17 7/17 unasyn >>  Dose adjustments this admission: 7/14 changed vanc to 500 mg q12h d/t improving renal funct 7/16 VT at 0930 = 10 (on 500 mg q12h) --> 500 mg x1, then change to 750 mg q12h  Microbiology results: 7/12 BCx x2 (at  2000): NGTD 7/12 [place] (at 1400): NGTD 7/12 UCx: insignif growth FINAL 7/12 MRSA PCR (-) 7/13 HIV antb (-)  Thank you for allowing pharmacy to be a part of this patient's care.  Dolly Rias RPh 11/06/2015, 9:25 AM Pager [phone]

## 2015-11-06 NOTE — Progress Notes (Signed)
Speech Language Pathology Treatment:    Patient Details Name: [name] MRN: [id number] DOB: 1940-07-09 Today's Date: 11/06/2015 Time: [id number] SLP Time Calculation (min) (ACUTE ONLY): 30 min  Assessment / Plan / Recommendation Clinical Impression  Pt reports desire for water - admits to 2-3 week h/o feeling nauseated with po intake and delayed clearance (pointing to proximal esophagus).  Trials of liquids observed = after consumption of 4 ounces of juice, pt sensed delayed clearance.  Consumption of thin warm water nor dry swallows faciliated sensation of clearance.  NO indication of airway compromise with intake - no coughing or throat clearing.  Pt did present with wet voice x1 that cleared with cued throat clear.  Pt denies sensation of pharyngeal residuals or aspiration.    Given lack of significant neuro hx and h/o patulous esophagus, reflux and now with candida esophagitis and ulcer, suspect aspiration risk is esophageal.    Pt also admits this dysphagia has been present x 2-3 weeks. She admits to improvement of reflux with PPI and s/p endoscopy but states dysphagia remains.  Hopeful for continued improvement with medical management.  Provided pt with written/verbal precautions/mitigation strategies using teach back for reinforcement.   Recommend full liquids initially with general precautions.   Given pt aware of her dysphagia, do not deem full supervision is required.  SLP to follow up.  Thanks.    HPI HPI: 75 year old female who has been in the ALF. She has a history of insulin-dependent type II diabetes, mild dementia, and schizophrenia. She has had previous surgery for breast cancer and apparently did not need chemoradiation therapy back in May. Her main ongoing complaint is chronic back pain. According to her into her daughter she takes a large amount of ibuprofen. She admits to taking ibuprofen at least 2 to 4 times daily for chronic back pain which has been going on for several  years. She developed hematemesis at the assisted living facility was brought to the emergency room and was slightly orthostatic with systolic blood pressure 86 in heart rate 132. She was also slightly hypoxic. Her stool was positive for blood was dark. Hemoglobin was 10.2 and drop to 6.7 with hydration. She was profoundly iron deficient. She denies ever having a history of ulcers or previous colonoscopy. She denies EGD. It's notable that she was on Nexium is outpatient for esophageal reflux and presumably she had been taking. She does complain of reflux and epigastric pain. She has no family history: cancer to the best of her knowledge      SLP Plan  Continue with current plan of care     Recommendations  Diet recommendations: Thin liquid (full liquids) Liquids provided via: Straw;Cup Medication Administration: Whole meds with puree Supervision: Full supervision/cueing for compensatory strategies;Staff to assist with self feeding Compensations: Slow rate;Small sips/bites;Follow solids with liquid Postural Changes and/or Swallow Maneuvers: Seated upright 90 degrees;Upright 30-60 min after meal             Oral Care Recommendations: Oral care BID Follow up Recommendations: Skilled Nursing facility Plan: Continue with current plan of care     [place], [place] [facility] SLP [phone]

## 2015-11-06 NOTE — Progress Notes (Signed)
PROGRESS NOTE                                                                                                                                                                                                             Patient Demographics:    [name], is a 75 y.o. female, DOB - [birth year], [id number]  Admit date - 11/01/2015   Admitting Physician Florencia Reasons, MD  Outpatient Primary MD for the patient is Jane Amel, MD  LOS - 5  Outpatient Specialists: none  Chief Complaint  Patient presents with  . Weakness  . GI Bleeding  . Hypotension       Brief Narrative   75 year old female with history of mild dementia, schizophrenia, insulin-dependent type 2 diabetes mellitus, CAD, hypertension, history of breast cysts status post lumpectomy in 08/2015 who is a resident of a left was sent to [facility] ED with several days of coffee-ground emesis and dark stools. As per EMS patient was orthostatic with blood pressure of 86/48 mmHg and heart rate of 132. She was also hypoxic with O2 sat of 87% on room air. FOBT was positive. Patient was septic with leukocytosis and lactic acidosis, acute kidney injury and abdominal x-ray with chest showing right upper and lower lobe infiltrate. Patient admitted to stepdown unit With septic shock. Subsequent hemoglobin dropped to 6.7 and wasTransfused with 2 units PRBC. EGD done showed esophageal candidiasis without ulceration or source of bleeding.   Subjective:   Patient afebrile but reports feeling very weak. PICC line placed yesterday for IV access.   Assessment  & Plan :   Principal problem Acute upper GI bleed . EGD negative for ulceration or bleed. Surgical pathology shows an ulcer.( may e the source for bleeding) Fungal culture pending. Hemoglobin improved after transfusion. Continue oral Protonix. Counseled on smoking cessation and avoiding NSAIDs.  Septic shock with acute  hypoxic respiratory failure secondary to multilobar pneumonia. Suspect aspiration. Two-view chest x-ray showing progressive asymmetry bilaterally is disease with pleural effusions. Continue empiric antibiotics and Lasix as needed. Culture so far negative. Continue nebs.  Esophageal candidiasis Biopsy shows ulcer.  cultures pending. on fluconazole.  Dysphagia Seen by speech and swallow and recommend dysphagia level I diet with thin liquid. Given worsened PNA was kept nothing by mouth. She was evaluated and recommended full liquid diet.  Insulin-dependent type 2  diabetes mellitus Off Lantus.  on sliding scale coverage. A1c of 6.  Essential hypertension Continue home blood pressure medications.  Prolonged QTC  Lateral held on admission. Now she normal. Has resumed..  Acute kidney injury Suspect prerenal due to septic shock. Resolved with fluids.  Hypokalemia Replenished  Metabolic acidosis  secondary to dehydration/ sepsis with lactic acidosis. Improving.  Poor IV access: PICC line placed on 7/16.   Code Status : Full code  Family Communication  : will update her daughter. Patient is very weak and would benefit from going to skilled nursing facility (although daughter was refusing earlier wide to take at home)  Disposition Plan  : PT recommends SNF however daughter wishes to take patient home with home health. Patient still symptomatic.  Barriers For Discharge : Persistent symptoms.  Consults  :  [facility]  Procedures  : EGD  DVT Prophylaxis  : SCDs  Lab Results  Component Value Date   PLT 260 11/06/2015    Antibiotics  :    Anti-infectives    Start     Dose/Rate Route Frequency Ordered Stop   11/06/15 1000  Ampicillin-Sulbactam (UNASYN) 3 g in sodium chloride 0.9 % 100 mL IVPB     3 g 100 mL/hr over 60 Minutes Intravenous Every 6 hours 11/06/15 0924     11/05/15 2200  vancomycin (VANCOCIN) IVPB 750 mg/150 ml premix  Status:  Discontinued     750 mg 150 mL/hr  over 60 Minutes Intravenous Every 12 hours 11/05/15 1119 11/06/15 0915   11/05/15 1130  vancomycin (VANCOCIN) 500 mg in sodium chloride 0.9 % 100 mL IVPB     500 mg 100 mL/hr over 60 Minutes Intravenous  Once 11/05/15 1119 11/05/15 1439   11/03/15 1000  vancomycin (VANCOCIN) 500 mg in sodium chloride 0.9 % 100 mL IVPB  Status:  Discontinued     500 mg 100 mL/hr over 60 Minutes Intravenous Every 12 hours 11/03/15 0920 11/05/15 1119   11/02/15 1400  vancomycin (VANCOCIN) IVPB 750 mg/150 ml premix  Status:  Discontinued     750 mg 150 mL/hr over 60 Minutes Intravenous Every 24 hours 11/01/15 1455 11/03/15 0920   11/02/15 1400  fluconazole (DIFLUCAN) tablet 200 mg     200 mg Oral Daily 11/02/15 1306 11/06/15 0822   11/01/15 2200  piperacillin-tazobactam (ZOSYN) IVPB 3.375 g  Status:  Discontinued     3.375 g 12.5 mL/hr over 240 Minutes Intravenous Every 8 hours 11/01/15 1455 11/06/15 0915   11/01/15 1415  piperacillin-tazobactam (ZOSYN) IVPB 3.375 g     3.375 g 100 mL/hr over 30 Minutes Intravenous  Once 11/01/15 1409 11/01/15 1546   11/01/15 1415  vancomycin (VANCOCIN) IVPB 1000 mg/200 mL premix     1,000 mg 200 mL/hr over 60 Minutes Intravenous  Once 11/01/15 1409 11/01/15 1626        Objective:   Filed Vitals:   11/05/15 1314 11/05/15 2043 11/06/15 0528 11/06/15 1225  BP: 147/69 149/66 105/80 151/72  Pulse: 91 88 108 87  Temp: 99.1 F (37.3 C) 99.9 F (37.7 C) 97.9 F (36.6 C) 99.5 F (37.5 C)  TempSrc: Oral Oral Oral Oral  Resp: 22 22 20 19   Height:      Weight:      SpO2: 99% 96% 92% 99%    Wt Readings from Last 3 Encounters:  11/04/15 72.938 kg (160 lb 12.8 oz)  10/04/15 71.079 kg (156 lb 11.2 oz)  09/07/15 71.668 kg (158 lb)  Intake/Output Summary (Last 24 hours) at 11/06/15 1414 Last data filed at 11/06/15 0529  Gross per 24 hour  Intake      0 ml  Output    200 ml  Net   -200 ml     Physical Exam  [id number], not in distress HEENT: moist mucosa,  supple neck,  Chest: Improved breath sounds over right lung, No rhonchi or wheeze CVS: N S1&S2, no murmurs, rubs or gallop GI: soft, NT, ND, BS+ Musculoskeletal: warm, no edema CNS: Alert and oriented    Data Review:    CBC  Recent Labs Lab 11/01/15 1236  11/02/15 0552 11/03/15 0303 11/04/15 0352 11/05/15 0408 11/06/15 0515  WBC 25.6*  --  23.0* 21.3* 20.5* 16.5* 17.1*  HGB 9.6*  < > 6.7* 10.0* 10.6* 9.7* 10.3*  HCT 28.6*  < > 20.6* 29.4* 30.8* 29.5* 30.5*  PLT 292  --  178 220 206 202 260  MCV 99.7  --  99.5 94.8 93.9 95.8 95.9  MCH 33.4  --  32.4 32.3 32.3 31.5 32.4  MCHC 33.6  --  32.5 34.0 34.4 32.9 33.8  RDW 17.8*  --  18.5* 18.7* 18.1* 17.6* 17.4*  LYMPHSABS 1.5  --   --   --   --   --   --   MONOABS 1.5*  --   --   --   --   --   --   EOSABS 0.0  --   --   --   --   --   --   BASOSABS 0.0  --   --   --   --   --   --   < > = values in this interval not displayed.  Chemistries   Recent Labs Lab 11/01/15 1236 11/01/15 1300 11/01/15 2000 11/02/15 0243 11/03/15 0303 11/04/15 0352 11/06/15 0515  NA  --  146*  --  145 146* 144 144  K  --  3.9  --  4.6 3.8 3.0* 3.3*  CL  --  116*  --  120* 122* 116* 111  CO2  --   --   --  15* 17* 19* 24  GLUCOSE  --  157*  --  84 96 107* 107*  BUN  --  52*  --  47* 26* 14 9  CREATININE  --  1.80*  --  1.36* 0.85 0.62 0.83  CALCIUM  --   --   --  9.5 8.9 8.7* 9.0  MG  --   --  2.0 2.0  --   --   --   AST 13*  --   --   --   --   --   --   ALT 9*  --   --   --   --   --   --   ALKPHOS 55  --   --   --   --   --   --   BILITOT 0.3  --   --   --   --   --   --    ------------------------------------------------------------------------------------------------------------------ No results for input(s): CHOL, HDL, LDLCALC, TRIG, CHOLHDL, LDLDIRECT in the last 72 hours.  Lab Results  Component Value Date   HGBA1C 6.0* 11/01/2015    ------------------------------------------------------------------------------------------------------------------ No results for input(s): TSH, T4TOTAL, T3FREE, THYROIDAB in the last 72 hours.  Invalid input(s): FREET3 ------------------------------------------------------------------------------------------------------------------ No results for input(s): VITAMINB12, FOLATE, FERRITIN, TIBC, IRON, RETICCTPCT in the last 72 hours.  Coagulation  profile  Recent Labs Lab 11/01/15 2318  INR 1.32    No results for input(s): DDIMER in the last 72 hours.  Cardiac Enzymes No results for input(s): CKMB, TROPONINI, MYOGLOBIN in the last 168 hours.  Invalid input(s): CK ------------------------------------------------------------------------------------------------------------------    Component Value Date/Time   BNP 143.0* 12/10/2014 2311    Inpatient Medications  Scheduled Meds: . amLODipine  5 mg Oral Daily  . ampicillin-sulbactam (UNASYN) IV  3 g Intravenous Q6H  . antiseptic oral rinse  7 mL Mouth Rinse q12n4p  . chlorhexidine  15 mL Mouth Rinse BID  . cloZAPine  150 mg Oral QHS  . donepezil  10 mg Oral QHS  . furosemide  20 mg Oral Daily  . insulin aspart  0-15 Units Subcutaneous TID WC  . irbesartan  75 mg Oral Daily  . memantine  10 mg Oral BID  . pantoprazole  40 mg Oral Daily  . sodium chloride flush  10-40 mL Intracatheter Q12H  . sodium chloride flush  3 mL Intravenous Q12H   Continuous Infusions:   PRN Meds:.fluticasone, HYDROcodone-acetaminophen, sodium chloride flush  Micro Results Recent Results (from the past 240 hour(s))  Blood Culture (routine x 2)     Status: None   Collection Time: 11/01/15  2:07 PM  Result Value Ref Range Status   Specimen Description BLOOD BLOOD RIGHT FOREARM  Final   Special Requests IN PEDIATRIC BOTTLE 3 CC  Final   Culture   Final    NO GROWTH 5 DAYS Performed at [facility]    Report Status 11/06/2015 FINAL  Final   Urine culture     Status: Abnormal   Collection Time: 11/01/15  3:54 PM  Result Value Ref Range Status   Specimen Description URINE, CATHETERIZED  Final   Special Requests NONE  Final   Culture (A)  Final    1,000 COLONIES/mL INSIGNIFICANT GROWTH Performed at [facility]    Report Status 11/02/2015 FINAL  Final  MRSA PCR Screening     Status: None   Collection Time: 11/01/15  5:27 PM  Result Value Ref Range Status   MRSA by PCR NEGATIVE NEGATIVE Final    Comment:        The GeneXpert MRSA Assay (FDA approved for NASAL specimens only), is one component of a comprehensive MRSA colonization surveillance program. It is not intended to diagnose MRSA infection nor to guide or monitor treatment for MRSA infections.   Culture, blood (routine x 2)     Status: None   Collection Time: 11/01/15  8:05 PM  Result Value Ref Range Status   Specimen Description BLOOD LEFT HAND  Final   Special Requests IN PEDIATRIC BOTTLE 3CC  Final   Culture   Final    NO GROWTH 5 DAYS Performed at [facility]    Report Status 11/06/2015 FINAL  Final  Culture, blood (routine x 2)     Status: None   Collection Time: 11/01/15  8:17 PM  Result Value Ref Range Status   Specimen Description BLOOD LEFT ANTECUBITAL  Final   Special Requests IN PEDIATRIC BOTTLE [facility]  Final   Culture   Final    NO GROWTH 5 DAYS Performed at [facility]    Report Status 11/06/2015 FINAL  Final    Radiology Reports Dg Chest 2 View  11/05/2015  CLINICAL DATA:  Lethargy. EXAM: CHEST  2 VIEW COMPARISON:  11/01/2015 FINDINGS: AP and lateral views of the chest show bilateral asymmetric  airspace disease, right mid lung and retrocardiac left base with probable small pleural effusions. Disease is progressive in the interval. The cardiopericardial silhouette is within normal limits for size. The visualized bony structures of the thorax are intact. IMPRESSION: Progression of asymmetric bilateral airspace disease  with pleural effusions. Asymmetric edema versus diffuse infection. Electronically Signed   By: Misty Stanley M.D.   On: 11/05/2015 11:07   Ct Head Wo Contrast  11/01/2015  CLINICAL DATA:  75 year old female with confusion EXAM: CT HEAD WITHOUT CONTRAST TECHNIQUE: Contiguous axial images were obtained from the base of the skull through the vertex without intravenous contrast. COMPARISON:  CT dated 12/26/2014 FINDINGS: There is mild age-related atrophy and chronic microvascular ischemic changes. There is no acute intracranial hemorrhage. No mass effect or midline shift noted. The visualized paranasal sinuses and mastoid air cells are clear. The calvarium is intact. IMPRESSION: No acute intracranial hemorrhage. Mild age-related atrophy and chronic microvascular ischemic disease. If symptoms persist and there are no contraindications, MRI may provide better evaluation if clinically indicated Electronically Signed   By: Anner Crete M.D.   On: 11/01/2015 21:51   Dg Abd Acute W/chest  11/01/2015  CLINICAL DATA:  Peptic ulcer disease, concern for GI bleed, check for free air. Abdominal pain. EXAM: DG ABDOMEN ACUTE W/ 1V CHEST COMPARISON:  08/22/2015 FINDINGS: Patchy airspace disease in the right upper lobe and right lower lobe, possible pneumonia. This was not present on the recent study. No effusion. Left lung clear. Nonobstructive bowel gas pattern.  No free air. Surgical clips in the gallbladder fossa. Lumbar levoscoliosis. No acute skeletal abnormality. No urinary tract calculi. IMPRESSION: Infiltrate in the right lower lobe and right upper lobe, suspicious for pneumonia Normal bowel gas pattern.  No free air. Electronically Signed   By: Franchot Gallo M.D.   On: 11/01/2015 13:29    Time Spent in minutes  25   Louellen Molder M.D on 11/06/2015 at 2:14 PM  Between 7am to 7pm - Pager - [phone]  After 7pm go to [email or web address] - password [facility]  [facility] -  Office  [phone]

## 2015-11-07 LAB — BASIC METABOLIC PANEL
ANION GAP: 5 (ref 5–15)
BUN: 7 mg/dL (ref 6–20)
CALCIUM: 8.5 mg/dL — AB (ref 8.9–10.3)
CO2: 26 mmol/L (ref 22–32)
Chloride: 111 mmol/L (ref 101–111)
Creatinine, Ser: 0.7 mg/dL (ref 0.44–1.00)
GFR calc Af Amer: >60 mL/min (ref >60)
Glucose, Bld: 96 mg/dL (ref 65–99)
POTASSIUM: 4.4 mmol/L (ref 3.5–5.1)
SODIUM: 142 mmol/L (ref 135–145)

## 2015-11-07 LAB — CBC
HEMATOCRIT: 28 % — AB (ref 36.0–46.0)
Hemoglobin: 9.3 g/dL — ABNORMAL LOW (ref 12.0–15.0)
MCH: 32.3 pg (ref 26.0–34.0)
MCHC: 33.2 g/dL (ref 30.0–36.0)
MCV: 97.2 fL (ref 78.0–100.0)
Platelets: 302 10*3/uL (ref 150–400)
RBC: 2.88 MIL/uL — ABNORMAL LOW (ref 3.87–5.11)
RDW: 17 % — ABNORMAL HIGH (ref 11.5–15.5)
WBC: 13 10*3/uL — AB (ref 4.0–10.5)

## 2015-11-07 LAB — GLUCOSE, CAPILLARY
GLUCOSE-CAPILLARY: 144 mg/dL — AB (ref 65–99)
GLUCOSE-CAPILLARY: 147 mg/dL — AB (ref 65–99)
Glucose-Capillary: 205 mg/dL — ABNORMAL HIGH (ref 65–99)
Glucose-Capillary: 176 mg/dL — ABNORMAL HIGH (ref 65–99)

## 2015-11-07 MED ORDER — GLUCERNA SHAKE PO LIQD
237.0000 mL | Freq: Three times a day (TID) | ORAL | Status: DC
  Administered 2015-11-07 – 2015-11-09 (×4): 237 mL via ORAL
  Filled 2015-11-07 (×8): qty 237

## 2015-11-07 MED ORDER — ONDANSETRON HCL 4 MG/2ML IJ SOLN
4.0000 mg | Freq: Four times a day (QID) | INTRAMUSCULAR | Status: DC | PRN
  Administered 2015-11-07: 4 mg via INTRAVENOUS
  Filled 2015-11-07: qty 2

## 2015-11-07 NOTE — Care Management Important Message (Signed)
Important Message  Patient Details  Name: Hunter Abdulmalik MRN: [id number] Date of Birth: 10-Dec-1940   Medicare Important Message Given:  Yes    Camillo Flaming 11/07/2015, 10:03 AMImportant Message  Patient Details  Name: Laporcha Cappelletti MRN: [id number] Date of Birth: [birth year]   Medicare Important Message Given:  Yes    Camillo Flaming 11/07/2015, 10:03 AM

## 2015-11-07 NOTE — Progress Notes (Signed)
This CM called and spoke with [name] (pt daughter) about disposition planning. Pt defers decisions to [facility].  PT recommendations were discussed with [name] and this CM expressed concern about pt going back to independent living in current condition with only receiving [facility] services and a personal aide three hours per day. [name] states "she has not been in the hospital long enough to warrant her going to a SNF".  CM encouraged Valarie to come and see pt when she works with PT so she can see how she does to make the best decision for her mother. [name] states she will do this. [name] states that pt has used [facility] in the past and would like to use them again for [facility] services if pt is to go back home. CM will continue to follow along. Marney Doctor RN,BSN,NCM [phone]

## 2015-11-07 NOTE — Progress Notes (Signed)
Speech Language Pathology Treatment: Dysphagia  Patient Details Name: Viveca Raitz MRN: [id number] DOB: 26-Nov-1940 Today's Date: 11/07/2015 Time: [id number] SLP Time Calculation (min) (ACUTE ONLY): 25 min  Assessment / Plan / Recommendation Clinical Impression  Pt intake listed as 90% yesterday, she did state is was mildly "rough" pointing to proximal esophagus to indicate are of delayed clearance.  Daughter Theophilus Kinds present and SLP reviewed findings of prior MBS and swallow studies with pt and daughter.  Reviewed compensation strategies with pt and daughter providing information in writing and verbally.    Given pt reports more difficulties with solids than liquid and states yesterday's intake as mildly "rough", recommend continue full liquids/thin with precautions.   Pt and daughter agreeable to plan.  SLP to follow up to assess continued po tolerance, readiness for dietary advancement  and for family/pt education.     HPI HPI: 75 year old female who has been in the ALF. She has a history of insulin-dependent type II diabetes, mild dementia, and schizophrenia. She has had previous surgery for breast cancer and apparently did not need chemoradiation therapy back in May. Her main ongoing complaint is chronic back pain. According to her into her daughter she takes a large amount of ibuprofen. She admits to taking ibuprofen at least 2 to 4 times daily for chronic back pain which has been going on for several years. She developed hematemesis at the assisted living facility was brought to the emergency room and was slightly orthostatic with systolic blood pressure 86 in heart rate 132. She was also slightly hypoxic. Her stool was positive for blood was dark. Hemoglobin was 10.2 and drop to 6.7 with hydration. She was profoundly iron deficient. She denies ever having a history of ulcers or previous colonoscopy. She denies EGD. It's notable that she was on Nexium is outpatient for esophageal reflux and  presumably she had been taking. She does complain of reflux and epigastric pain. She has no family history: cancer to the best of her knowledge      SLP Plan  Continue with current plan of care     Recommendations  Diet recommendations: Thin liquid;Nectar-thick liquid (fulll liquids) Liquids provided via: Straw;Cup Medication Administration: Whole meds with puree Supervision: Full supervision/cueing for compensatory strategies;Staff to assist with self feeding Compensations: Slow rate;Small sips/bites;Follow solids with liquid ("swallow hard") Postural Changes and/or Swallow Maneuvers: Seated upright 90 degrees;Upright 30-60 min after meal             Oral Care Recommendations: Oral care BID Follow up Recommendations: Skilled Nursing facility Plan: Continue with current plan of care     [place], [place] [facility] SLP [phone]

## 2015-11-07 NOTE — Progress Notes (Addendum)
PROGRESS NOTE                                                                                                                                                                                                             Patient Demographics:    [name], is a 75 y.o. female, DOB - 12-Apr-1941, [id number]  Admit date - 11/01/2015   Admitting Physician Florencia Reasons, MD  Outpatient Primary MD for the patient is Jane Amel, MD  LOS - 6  Outpatient Specialists: none  Chief Complaint  Patient presents with  . Weakness  . GI Bleeding  . Hypotension       Brief Narrative   75 year old female with history of mild dementia, schizophrenia, insulin-dependent type 2 diabetes mellitus, CAD, hypertension, history of breast cysts status post lumpectomy in 08/2015 who is a resident of a left was sent to [facility] ED with several days of coffee-ground emesis and dark stools. As per EMS patient was orthostatic with blood pressure of 86/48 mmHg and heart rate of 132. She was also hypoxic with O2 sat of 87% on room air. FOBT was positive. Patient was septic with leukocytosis and lactic acidosis, acute kidney injury and abdominal x-ray with chest showing right upper and lower lobe infiltrate. Patient admitted to stepdown unit With septic shock. Subsequent hemoglobin dropped to 6.7 and wasTransfused with 2 units PRBC. EGD done showed esophageal candidiasis without ulceration or source of bleeding.   Subjective:   Pt still feels very weak   Assessment  & Plan :   Principal problem Acute upper GI bleed EGD negative for ulceration or bleed. Surgical pathology shows an ulcer.( may e the source for bleeding) Fungal culture pending. Hemoglobin improved after transfusion. Continue oral Protonix. Counseled on smoking cessation and avoiding NSAIDs.  Septic shock with acute hypoxic respiratory failure secondary to multilobar pneumonia.  Suspect aspiration. Two-view chest x-ray showing progressive asymmetry bilaterally is disease with pleural effusions. Continue empiric antibiotics for 8 days and Lasix as needed. Culture so far negative. Continue nebs.  Esophageal candidiasis Biopsy shows ulcer.  cultures pending. on fluconazole.  Dysphagia Seen by speech and swallow and recommended full liquid diet. tolerating well.  Insulin-dependent type 2 diabetes mellitus Off Lantus.  on sliding scale coverage. A1c of 6.  Essential hypertension Continue home blood pressure medications.  Prolonged QTC  haldol  resumed after bweing held for few days ( Qtc normalized)  Acute kidney injury Suspect prerenal due to septic shock. Resolved with fluids.  Hypokalemia Replenished  Metabolic acidosis  secondary to dehydration/ sepsis with lactic acidosis. Improving.  Poor IV access: PICC line placed on 7/16.   Code Status : Full code  Family Communication  : called daughter [name] and left a msg  Disposition Plan  : PT recommends SNF. Pt is extremely weak to go back to independent living or with [facility]. She herself expresses wanting to go to SNF.  Barriers For Discharge : needs SNF and d/w family  Consults  :  [facility]  Procedures  : EGD  DVT Prophylaxis  : SCDs  Lab Results  Component Value Date   PLT 302 11/07/2015    Antibiotics  :  Completes 8 day course on 7/19  Anti-infectives    Start     Dose/Rate Route Frequency Ordered Stop   11/06/15 1000  Ampicillin-Sulbactam (UNASYN) 3 g in sodium chloride 0.9 % 100 mL IVPB     3 g 100 mL/hr over 60 Minutes Intravenous Every 6 hours 11/06/15 0924     11/05/15 2200  vancomycin (VANCOCIN) IVPB 750 mg/150 ml premix  Status:  Discontinued     750 mg 150 mL/hr over 60 Minutes Intravenous Every 12 hours 11/05/15 1119 11/06/15 0915   11/05/15 1130  vancomycin (VANCOCIN) 500 mg in sodium chloride 0.9 % 100 mL IVPB     500 mg 100 mL/hr over 60 Minutes Intravenous  Once 11/05/15  1119 11/05/15 1439   11/03/15 1000  vancomycin (VANCOCIN) 500 mg in sodium chloride 0.9 % 100 mL IVPB  Status:  Discontinued     500 mg 100 mL/hr over 60 Minutes Intravenous Every 12 hours 11/03/15 0920 11/05/15 1119   11/02/15 1400  vancomycin (VANCOCIN) IVPB 750 mg/150 ml premix  Status:  Discontinued     750 mg 150 mL/hr over 60 Minutes Intravenous Every 24 hours 11/01/15 1455 11/03/15 0920   11/02/15 1400  fluconazole (DIFLUCAN) tablet 200 mg     200 mg Oral Daily 11/02/15 1306 11/06/15 0822   11/01/15 2200  piperacillin-tazobactam (ZOSYN) IVPB 3.375 g  Status:  Discontinued     3.375 g 12.5 mL/hr over 240 Minutes Intravenous Every 8 hours 11/01/15 1455 11/06/15 0915   11/01/15 1415  piperacillin-tazobactam (ZOSYN) IVPB 3.375 g     3.375 g 100 mL/hr over 30 Minutes Intravenous  Once 11/01/15 1409 11/01/15 1546   11/01/15 1415  vancomycin (VANCOCIN) IVPB 1000 mg/200 mL premix     1,000 mg 200 mL/hr over 60 Minutes Intravenous  Once 11/01/15 1409 11/01/15 1626        Objective:   Filed Vitals:   11/06/15 0528 11/06/15 1225 11/06/15 2223 11/07/15 0606  BP: 105/80 151/72 143/57 136/60  Pulse: 108 87 93 90  Temp: 97.9 F (36.6 C) 99.5 F (37.5 C) 98.5 F (36.9 C) 98.1 F (36.7 C)  TempSrc: Oral Oral Oral Oral  Resp: 20 19 20 18   Height:      Weight:      SpO2: 92% 99% 98% 96%    Wt Readings from Last 3 Encounters:  11/04/15 72.938 kg (160 lb 12.8 oz)  10/04/15 71.079 kg (156 lb 11.2 oz)  09/07/15 71.668 kg (158 lb)     Intake/Output Summary (Last 24 hours) at 11/07/15 1615 Last data filed at 11/07/15 1013  Gross per 24 hour  Intake    480  ml  Output      0 ml  Net    480 ml     Physical Exam  [id number], not in distress HEENT: moist mucosa, supple neck,  Chest: clear b/l No rhonchi or wheeze CVS: N S1&S2, no murmurs, rubs or gallop GI: soft, NT, ND, BS+ Musculoskeletal: warm, no edema CNS: Alert and oriented    Data Review:    CBC  Recent  Labs Lab 11/01/15 1236  11/03/15 0303 11/04/15 0352 11/05/15 0408 11/06/15 0515 11/07/15 1035  WBC 25.6*  < > 21.3* 20.5* 16.5* 17.1* 13.0*  HGB 9.6*  < > 10.0* 10.6* 9.7* 10.3* 9.3*  HCT 28.6*  < > 29.4* 30.8* 29.5* 30.5* 28.0*  PLT 292  < > 220 206 202 260 302  MCV 99.7  < > 94.8 93.9 95.8 95.9 97.2  MCH 33.4  < > 32.3 32.3 31.5 32.4 32.3  MCHC 33.6  < > 34.0 34.4 32.9 33.8 33.2  RDW 17.8*  < > 18.7* 18.1* 17.6* 17.4* 17.0*  LYMPHSABS 1.5  --   --   --   --   --   --   MONOABS 1.5*  --   --   --   --   --   --   EOSABS 0.0  --   --   --   --   --   --   BASOSABS 0.0  --   --   --   --   --   --   < > = values in this interval not displayed.  Chemistries   Recent Labs Lab 11/01/15 1236  11/01/15 2000 11/02/15 0243 11/03/15 0303 11/04/15 0352 11/06/15 0515 11/07/15 0526  NA  --   < >  --  145 146* 144 144 142  K  --   < >  --  4.6 3.8 3.0* 3.3* 4.4  CL  --   < >  --  120* 122* 116* 111 111  CO2  --   --   --  15* 17* 19* 24 26  GLUCOSE  --   < >  --  84 96 107* 107* 96  BUN  --   < >  --  47* 26* 14 9 7   CREATININE  --   < >  --  1.36* 0.85 0.62 0.83 0.70  CALCIUM  --   --   --  9.5 8.9 8.7* 9.0 8.5*  MG  --   --  2.0 2.0  --   --   --   --   AST 13*  --   --   --   --   --   --   --   ALT 9*  --   --   --   --   --   --   --   ALKPHOS 55  --   --   --   --   --   --   --   BILITOT 0.3  --   --   --   --   --   --   --   < > = values in this interval not displayed. ------------------------------------------------------------------------------------------------------------------ No results for input(s): CHOL, HDL, LDLCALC, TRIG, CHOLHDL, LDLDIRECT in the last 72 hours.  Lab Results  Component Value Date   HGBA1C 6.0* 11/01/2015   ------------------------------------------------------------------------------------------------------------------ No results for input(s): TSH, T4TOTAL, T3FREE, THYROIDAB in the last 72 hours.  Invalid input(s):  FREET3 ------------------------------------------------------------------------------------------------------------------ No  results for input(s): VITAMINB12, FOLATE, FERRITIN, TIBC, IRON, RETICCTPCT in the last 72 hours.  Coagulation profile  Recent Labs Lab 11/01/15 2318  INR 1.32    No results for input(s): DDIMER in the last 72 hours.  Cardiac Enzymes No results for input(s): CKMB, TROPONINI, MYOGLOBIN in the last 168 hours.  Invalid input(s): CK ------------------------------------------------------------------------------------------------------------------    Component Value Date/Time   BNP 143.0* 12/10/2014 2311    Inpatient Medications  Scheduled Meds: . amLODipine  5 mg Oral Daily  . ampicillin-sulbactam (UNASYN) IV  3 g Intravenous Q6H  . antiseptic oral rinse  7 mL Mouth Rinse q12n4p  . chlorhexidine  15 mL Mouth Rinse BID  . cloZAPine  150 mg Oral QHS  . donepezil  10 mg Oral QHS  . feeding supplement (GLUCERNA SHAKE)  237 mL Oral TID BM  . furosemide  20 mg Oral Daily  . haloperidol  5 mg Oral q morning - 10a   And  . haloperidol  10 mg Oral QHS  . insulin aspart  0-15 Units Subcutaneous TID WC  . irbesartan  75 mg Oral Daily  . memantine  10 mg Oral BID  . pantoprazole  40 mg Oral Daily  . sodium chloride flush  10-40 mL Intracatheter Q12H  . sodium chloride flush  3 mL Intravenous Q12H   Continuous Infusions:   PRN Meds:.fluticasone, HYDROcodone-acetaminophen, ondansetron, sodium chloride flush  Micro Results Recent Results (from the past 240 hour(s))  Blood Culture (routine x 2)     Status: None   Collection Time: 11/01/15  2:07 PM  Result Value Ref Range Status   Specimen Description BLOOD BLOOD RIGHT FOREARM  Final   Special Requests IN PEDIATRIC BOTTLE 3 CC  Final   Culture   Final    NO GROWTH 5 DAYS Performed at [facility]    Report Status 11/06/2015 FINAL  Final  Urine culture     Status: Abnormal   Collection Time:  11/01/15  3:54 PM  Result Value Ref Range Status   Specimen Description URINE, CATHETERIZED  Final   Special Requests NONE  Final   Culture (A)  Final    1,000 COLONIES/mL INSIGNIFICANT GROWTH Performed at [facility]    Report Status 11/02/2015 FINAL  Final  MRSA PCR Screening     Status: None   Collection Time: 11/01/15  5:27 PM  Result Value Ref Range Status   MRSA by PCR NEGATIVE NEGATIVE Final    Comment:        The GeneXpert MRSA Assay (FDA approved for NASAL specimens only), is one component of a comprehensive MRSA colonization surveillance program. It is not intended to diagnose MRSA infection nor to guide or monitor treatment for MRSA infections.   Culture, blood (routine x 2)     Status: None   Collection Time: 11/01/15  8:05 PM  Result Value Ref Range Status   Specimen Description BLOOD LEFT HAND  Final   Special Requests IN PEDIATRIC BOTTLE 3CC  Final   Culture   Final    NO GROWTH 5 DAYS Performed at [facility]    Report Status 11/06/2015 FINAL  Final  Culture, blood (routine x 2)     Status: None   Collection Time: 11/01/15  8:17 PM  Result Value Ref Range Status   Specimen Description BLOOD LEFT ANTECUBITAL  Final   Special Requests IN PEDIATRIC BOTTLE [facility]  Final   Culture   Final    NO GROWTH  5 DAYS Performed at [facility]    Report Status 11/06/2015 FINAL  Final    Radiology Reports Dg Chest 2 View  11/05/2015  CLINICAL DATA:  Lethargy. EXAM: CHEST  2 VIEW COMPARISON:  11/01/2015 FINDINGS: AP and lateral views of the chest show bilateral asymmetric airspace disease, right mid lung and retrocardiac left base with probable small pleural effusions. Disease is progressive in the interval. The cardiopericardial silhouette is within normal limits for size. The visualized bony structures of the thorax are intact. IMPRESSION: Progression of asymmetric bilateral airspace disease with pleural effusions. Asymmetric edema versus diffuse  infection. Electronically Signed   By: Misty Stanley M.D.   On: 11/05/2015 11:07   Ct Head Wo Contrast  11/01/2015  CLINICAL DATA:  75 year old female with confusion EXAM: CT HEAD WITHOUT CONTRAST TECHNIQUE: Contiguous axial images were obtained from the base of the skull through the vertex without intravenous contrast. COMPARISON:  CT dated 12/26/2014 FINDINGS: There is mild age-related atrophy and chronic microvascular ischemic changes. There is no acute intracranial hemorrhage. No mass effect or midline shift noted. The visualized paranasal sinuses and mastoid air cells are clear. The calvarium is intact. IMPRESSION: No acute intracranial hemorrhage. Mild age-related atrophy and chronic microvascular ischemic disease. If symptoms persist and there are no contraindications, MRI may provide better evaluation if clinically indicated Electronically Signed   By: Anner Crete M.D.   On: 11/01/2015 21:51   Dg Abd Acute W/chest  11/01/2015  CLINICAL DATA:  Peptic ulcer disease, concern for GI bleed, check for free air. Abdominal pain. EXAM: DG ABDOMEN ACUTE W/ 1V CHEST COMPARISON:  08/22/2015 FINDINGS: Patchy airspace disease in the right upper lobe and right lower lobe, possible pneumonia. This was not present on the recent study. No effusion. Left lung clear. Nonobstructive bowel gas pattern.  No free air. Surgical clips in the gallbladder fossa. Lumbar levoscoliosis. No acute skeletal abnormality. No urinary tract calculi. IMPRESSION: Infiltrate in the right lower lobe and right upper lobe, suspicious for pneumonia Normal bowel gas pattern.  No free air. Electronically Signed   By: Franchot Gallo M.D.   On: 11/01/2015 13:29    Time Spent in minutes  25   Louellen Molder M.D on 11/07/2015 at 4:15 PM  Between 7am to 7pm - Pager - [phone]  After 7pm go to [email or web address] - password [facility]  [facility] -  Office  [phone]

## 2015-11-08 DIAGNOSIS — R6521 Severe sepsis with septic shock: Secondary | ICD-10-CM

## 2015-11-08 DIAGNOSIS — E1140 Type 2 diabetes mellitus with diabetic neuropathy, unspecified: Secondary | ICD-10-CM

## 2015-11-08 DIAGNOSIS — F209 Schizophrenia, unspecified: Secondary | ICD-10-CM

## 2015-11-08 DIAGNOSIS — D62 Acute posthemorrhagic anemia: Secondary | ICD-10-CM

## 2015-11-08 LAB — CBC WITH DIFFERENTIAL/PLATELET
BASOS ABS: 0 10*3/uL (ref 0.0–0.1)
Basophils Relative: 0 %
EOS ABS: 0.2 10*3/uL (ref 0.0–0.7)
Eosinophils Relative: 2 %
HEMATOCRIT: 25.7 % — AB (ref 36.0–46.0)
Hemoglobin: 8.5 g/dL — ABNORMAL LOW (ref 12.0–15.0)
LYMPHS ABS: 2.6 10*3/uL (ref 0.7–4.0)
Lymphocytes Relative: 23 %
MCH: 32.3 pg (ref 26.0–34.0)
MCHC: 33.1 g/dL (ref 30.0–36.0)
MCV: 97.7 fL (ref 78.0–100.0)
MONO ABS: 1.5 10*3/uL — AB (ref 0.1–1.0)
Monocytes Relative: 13 %
NEUTROS PCT: 62 %
Neutro Abs: 6.9 10*3/uL (ref 1.7–7.7)
PLATELETS: 330 10*3/uL (ref 150–400)
RBC: 2.63 MIL/uL — AB (ref 3.87–5.11)
RDW: 16.9 % — AB (ref 11.5–15.5)
WBC: 11.2 10*3/uL — AB (ref 4.0–10.5)

## 2015-11-08 LAB — GLUCOSE, CAPILLARY
GLUCOSE-CAPILLARY: 130 mg/dL — AB (ref 65–99)
GLUCOSE-CAPILLARY: 98 mg/dL (ref 65–99)
Glucose-Capillary: 164 mg/dL — ABNORMAL HIGH (ref 65–99)
Glucose-Capillary: 166 mg/dL — ABNORMAL HIGH (ref 65–99)

## 2015-11-08 MED ORDER — SACCHAROMYCES BOULARDII 250 MG PO CAPS
250.0000 mg | ORAL_CAPSULE | Freq: Two times a day (BID) | ORAL | Status: DC
  Administered 2015-11-08 – 2015-11-09 (×3): 250 mg via ORAL
  Filled 2015-11-08 (×3): qty 1

## 2015-11-08 MED ORDER — HEPARIN SOD (PORK) LOCK FLUSH 100 UNIT/ML IV SOLN: 500.0000 [IU] | INTRAVENOUS | Status: DC | PRN

## 2015-11-08 NOTE — Progress Notes (Signed)
Per PT patient did better today and they are now recommending HHPT. This CM met with pt at bedside and confirmed she would like to go back to her home with HHPT from [facility]. [facility] rep alerted of referral. CM will continue to follow for needs. Marney Doctor RN,BSN,NCM [phone]

## 2015-11-08 NOTE — Progress Notes (Signed)
PROGRESS NOTE                                                                                                                                                                                                             Patient Demographics:    [name], is a 75 y.o. female, DOB - 1940/04/25, [id number]  Admit date - 11/01/2015   Admitting Physician Florencia Reasons, MD  Outpatient Primary MD for the patient is [name], MD  LOS - 7  Outpatient Specialists: none  Chief Complaint  Patient presents with  . Weakness  . GI Bleeding  . Hypotension       Brief Narrative   75 year old female with history of mild dementia, schizophrenia, insulin-dependent type 2 diabetes mellitus, CAD, hypertension, history of breast cysts status post lumpectomy in 08/2015 who is a resident of a left was sent to [facility] ED with several days of coffee-ground emesis and dark stools. As per EMS patient was orthostatic with blood pressure of 86/48 mmHg and heart rate of 132. She was also hypoxic with O2 sat of 87% on room air. FOBT was positive. Patient was septic with leukocytosis and lactic acidosis, acute kidney injury and abdominal x-ray with chest showing right upper and lower lobe infiltrate. Patient admitted to stepdown unit With septic shock. Subsequent hemoglobin dropped to 6.7 and wasTransfused with 2 units PRBC. EGD done showed esophageal candidiasis without ulceration or source of bleeding.   Subjective:   Pt still feels very weak but eating and drinking well.    Assessment  & Plan :   Principal problem Acute upper GI bleed EGD negative for ulceration or bleed. Surgical pathology shows an ulcer.( may e the source for bleeding) Fungal culture pending. Hemoglobin improved after transfusion. Continue oral Protonix. Counseled on smoking cessation and avoiding NSAIDs.  Septic shock with acute hypoxic respiratory failure secondary  to multilobar pneumonia. Suspect aspiration. Two-view chest x-ray showing progressive asymmetry bilaterally is disease with pleural effusions. Continue empiric antibiotics for 8 days and Lasix as needed. Culture so far negative. Continue nebs.  Esophageal candidiasis Biopsy shows ulcer.  cultures pending. on fluconazole.  Dysphagia Seen by speech and swallow and recommended full liquid diet. tolerating well.  Insulin-dependent type 2 diabetes mellitus Off Lantus.  on sliding scale coverage. A1c of 6.   CBG (last 3)  Recent Labs  11/07/15 2128 11/08/15 0743 11/08/15 1214  GLUCAP 147* 130* 164*   Essential hypertension Continue home blood pressure medications.  Prolonged QTC  haldol resumed after being held for few days ( Qtc normalized)  Acute kidney injury Suspect prerenal due to septic shock. Resolved with fluids.  Hypokalemia Replenished  Metabolic acidosis  secondary to dehydration/ sepsis with lactic acidosis. Improving.  Poor IV access: PICC line placed on 7/16.   Code Status : Full code  Family Communication  : called daughter valerie and left a msg  Disposition Plan  : Plan DC home 7/20.   Barriers For Discharge : needs SNF and d/w family (Pt has decided against SNF and would go home with [facility])   Consults  :  [facility]  Procedures  : EGD  DVT Prophylaxis  : SCDs  Lab Results  Component Value Date   PLT 330 11/08/2015    Antibiotics  :  Completes 8 day course to complete on 7/19   Anti-infectives    Start     Dose/Rate Route Frequency Ordered Stop   11/06/15 1000  Ampicillin-Sulbactam (UNASYN) 3 g in sodium chloride 0.9 % 100 mL IVPB     3 g 100 mL/hr over 60 Minutes Intravenous Every 6 hours 11/06/15 0924 11/09/15 0359   11/05/15 2200  vancomycin (VANCOCIN) IVPB 750 mg/150 ml premix  Status:  Discontinued     750 mg 150 mL/hr over 60 Minutes Intravenous Every 12 hours 11/05/15 1119 11/06/15 0915   11/05/15 1130  vancomycin (VANCOCIN) 500 mg in  sodium chloride 0.9 % 100 mL IVPB     500 mg 100 mL/hr over 60 Minutes Intravenous  Once 11/05/15 1119 11/05/15 1439   11/03/15 1000  vancomycin (VANCOCIN) 500 mg in sodium chloride 0.9 % 100 mL IVPB  Status:  Discontinued     500 mg 100 mL/hr over 60 Minutes Intravenous Every 12 hours 11/03/15 0920 11/05/15 1119   11/02/15 1400  vancomycin (VANCOCIN) IVPB 750 mg/150 ml premix  Status:  Discontinued     750 mg 150 mL/hr over 60 Minutes Intravenous Every 24 hours 11/01/15 1455 11/03/15 0920   11/02/15 1400  fluconazole (DIFLUCAN) tablet 200 mg     200 mg Oral Daily 11/02/15 1306 11/06/15 0822   11/01/15 2200  piperacillin-tazobactam (ZOSYN) IVPB 3.375 g  Status:  Discontinued     3.375 g 12.5 mL/hr over 240 Minutes Intravenous Every 8 hours 11/01/15 1455 11/06/15 0915   11/01/15 1415  piperacillin-tazobactam (ZOSYN) IVPB 3.375 g     3.375 g 100 mL/hr over 30 Minutes Intravenous  Once 11/01/15 1409 11/01/15 1546   11/01/15 1415  vancomycin (VANCOCIN) IVPB 1000 mg/200 mL premix     1,000 mg 200 mL/hr over 60 Minutes Intravenous  Once 11/01/15 1409 11/01/15 1626        Objective:   Filed Vitals:   11/07/15 0606 11/07/15 2111 11/08/15 0624 11/08/15 1419  BP: 136/60 156/63 148/69 152/59  Pulse: 90 94 88 97  Temp: 98.1 F (36.7 C) 98 F (36.7 C) 97.7 F (36.5 C) 98.7 F (37.1 C)  TempSrc: Oral Oral Oral Oral  Resp: 18 17 17 18   Height:      Weight:      SpO2: 96% 98% 98% 98%    Wt Readings from Last 3 Encounters:  11/04/15 72.938 kg (160 lb 12.8 oz)  10/04/15 71.079 kg (156 lb 11.2 oz)  09/07/15 71.668 kg (158 lb)     Intake/Output  Summary (Last 24 hours) at 11/08/15 1528 Last data filed at 11/08/15 1020  Gross per 24 hour  Intake    480 ml  Output      0 ml  Net    480 ml     Physical Exam  [id number], not in distress HEENT: moist mucosa, supple neck,  Chest: clear b/l No rhonchi or wheeze CVS: N S1&S2, no murmurs, rubs or gallop GI: soft, NT, ND,  BS+ Musculoskeletal: warm, no edema CNS: Alert and oriented    Data Review:    CBC  Recent Labs Lab 11/04/15 0352 11/05/15 0408 11/06/15 0515 11/07/15 1035 11/08/15 0550  WBC 20.5* 16.5* 17.1* 13.0* 11.2*  HGB 10.6* 9.7* 10.3* 9.3* 8.5*  HCT 30.8* 29.5* 30.5* 28.0* 25.7*  PLT 206 202 260 302 330  MCV 93.9 95.8 95.9 97.2 97.7  MCH 32.3 31.5 32.4 32.3 32.3  MCHC 34.4 32.9 33.8 33.2 33.1  RDW 18.1* 17.6* 17.4* 17.0* 16.9*  LYMPHSABS  --   --   --   --  2.6  MONOABS  --   --   --   --  1.5*  EOSABS  --   --   --   --  0.2  BASOSABS  --   --   --   --  0.0    Chemistries   Recent Labs Lab 11/01/15 2000 11/02/15 0243 11/03/15 0303 11/04/15 0352 11/06/15 0515 11/07/15 0526  NA  --  145 146* 144 144 142  K  --  4.6 3.8 3.0* 3.3* 4.4  CL  --  120* 122* 116* 111 111  CO2  --  15* 17* 19* 24 26  GLUCOSE  --  84 96 107* 107* 96  BUN  --  47* 26* 14 9 7   CREATININE  --  1.36* 0.85 0.62 0.83 0.70  CALCIUM  --  9.5 8.9 8.7* 9.0 8.5*  MG 2.0 2.0  --   --   --   --    ------------------------------------------------------------------------------------------------------------------ No results for input(s): CHOL, HDL, LDLCALC, TRIG, CHOLHDL, LDLDIRECT in the last 72 hours.  Lab Results  Component Value Date   HGBA1C 6.0* 11/01/2015   ------------------------------------------------------------------------------------------------------------------ No results for input(s): TSH, T4TOTAL, T3FREE, THYROIDAB in the last 72 hours.  Invalid input(s): FREET3 ------------------------------------------------------------------------------------------------------------------ No results for input(s): VITAMINB12, FOLATE, FERRITIN, TIBC, IRON, RETICCTPCT in the last 72 hours.  Coagulation profile  Recent Labs Lab 11/01/15 2318  INR 1.32    No results for input(s): DDIMER in the last 72 hours.  Cardiac Enzymes No results for input(s): CKMB, TROPONINI, MYOGLOBIN in the last 168  hours.  Invalid input(s): CK ------------------------------------------------------------------------------------------------------------------    Component Value Date/Time   BNP 143.0* 12/10/2014 2311    Inpatient Medications  Scheduled Meds: . amLODipine  5 mg Oral Daily  . ampicillin-sulbactam (UNASYN) IV  3 g Intravenous Q6H  . antiseptic oral rinse  7 mL Mouth Rinse q12n4p  . chlorhexidine  15 mL Mouth Rinse BID  . cloZAPine  150 mg Oral QHS  . donepezil  10 mg Oral QHS  . feeding supplement (GLUCERNA SHAKE)  237 mL Oral TID BM  . furosemide  20 mg Oral Daily  . haloperidol  5 mg Oral q morning - 10a   And  . haloperidol  10 mg Oral QHS  . insulin aspart  0-15 Units Subcutaneous TID WC  . irbesartan  75 mg Oral Daily  . memantine  10 mg Oral BID  . pantoprazole  40 mg Oral Daily  . sodium chloride flush  10-40 mL Intracatheter Q12H  . sodium chloride flush  3 mL Intravenous Q12H   Continuous Infusions:   PRN Meds:.fluticasone, HYDROcodone-acetaminophen, ondansetron, sodium chloride flush  Micro Results Recent Results (from the past 240 hour(s))  Blood Culture (routine x 2)     Status: None   Collection Time: 11/01/15  2:07 PM  Result Value Ref Range Status   Specimen Description BLOOD BLOOD RIGHT FOREARM  Final   Special Requests IN PEDIATRIC BOTTLE 3 CC  Final   Culture   Final    NO GROWTH 5 DAYS Performed at [facility]    Report Status 11/06/2015 FINAL  Final  Urine culture     Status: Abnormal   Collection Time: 11/01/15  3:54 PM  Result Value Ref Range Status   Specimen Description URINE, CATHETERIZED  Final   Special Requests NONE  Final   Culture (A)  Final    1,000 COLONIES/mL INSIGNIFICANT GROWTH Performed at [facility]    Report Status 11/02/2015 FINAL  Final  MRSA PCR Screening     Status: None   Collection Time: 11/01/15  5:27 PM  Result Value Ref Range Status   MRSA by PCR NEGATIVE NEGATIVE Final    Comment:        The  GeneXpert MRSA Assay (FDA approved for NASAL specimens only), is one component of a comprehensive MRSA colonization surveillance program. It is not intended to diagnose MRSA infection nor to guide or monitor treatment for MRSA infections.   Culture, blood (routine x 2)     Status: None   Collection Time: 11/01/15  8:05 PM  Result Value Ref Range Status   Specimen Description BLOOD LEFT HAND  Final   Special Requests IN PEDIATRIC BOTTLE 3CC  Final   Culture   Final    NO GROWTH 5 DAYS Performed at [facility]    Report Status 11/06/2015 FINAL  Final  Culture, blood (routine x 2)     Status: None   Collection Time: 11/01/15  8:17 PM  Result Value Ref Range Status   Specimen Description BLOOD LEFT ANTECUBITAL  Final   Special Requests IN PEDIATRIC BOTTLE [facility]  Final   Culture   Final    NO GROWTH 5 DAYS Performed at [facility]    Report Status 11/06/2015 FINAL  Final    Radiology Reports Dg Chest 2 View  11/05/2015  CLINICAL DATA:  Lethargy. EXAM: CHEST  2 VIEW COMPARISON:  11/01/2015 FINDINGS: AP and lateral views of the chest show bilateral asymmetric airspace disease, right mid lung and retrocardiac left base with probable small pleural effusions. Disease is progressive in the interval. The cardiopericardial silhouette is within normal limits for size. The visualized bony structures of the thorax are intact. IMPRESSION: Progression of asymmetric bilateral airspace disease with pleural effusions. Asymmetric edema versus diffuse infection. Electronically Signed   By: Misty Stanley M.D.   On: 11/05/2015 11:07   Ct Head Wo Contrast  11/01/2015  CLINICAL DATA:  75 year old female with confusion EXAM: CT HEAD WITHOUT CONTRAST TECHNIQUE: Contiguous axial images were obtained from the base of the skull through the vertex without intravenous contrast. COMPARISON:  CT dated 12/26/2014 FINDINGS: There is mild age-related atrophy and chronic microvascular ischemic changes.  There is no acute intracranial hemorrhage. No mass effect or midline shift noted. The visualized paranasal sinuses and mastoid air cells are clear. The calvarium is intact. IMPRESSION: No acute  intracranial hemorrhage. Mild age-related atrophy and chronic microvascular ischemic disease. If symptoms persist and there are no contraindications, MRI may provide better evaluation if clinically indicated Electronically Signed   By: Anner Crete M.D.   On: 11/01/2015 21:51   Dg Abd Acute W/chest  11/01/2015  CLINICAL DATA:  Peptic ulcer disease, concern for GI bleed, check for free air. Abdominal pain. EXAM: DG ABDOMEN ACUTE W/ 1V CHEST COMPARISON:  08/22/2015 FINDINGS: Patchy airspace disease in the right upper lobe and right lower lobe, possible pneumonia. This was not present on the recent study. No effusion. Left lung clear. Nonobstructive bowel gas pattern.  No free air. Surgical clips in the gallbladder fossa. Lumbar levoscoliosis. No acute skeletal abnormality. No urinary tract calculi. IMPRESSION: Infiltrate in the right lower lobe and right upper lobe, suspicious for pneumonia Normal bowel gas pattern.  No free air. Electronically Signed   By: Franchot Gallo M.D.   On: 11/01/2015 13:29    Time Spent in minutes  25   Irwin Brakeman M.D on 11/08/2015 at 3:28 PM  Between 7am to 7pm - Pager - [phone]  After 7pm go to [email or web address] - password [facility]  [facility] -  Office  [phone]

## 2015-11-08 NOTE — Progress Notes (Signed)
Physical Therapy Treatment Patient Details Name: [name] MRN: [id number] DOB: 04-11-1941 Today's Date: 11/08/2015    History of Present Illness 75 year old female with history of mild dementia, schizophrenia, insulin-dependent type 2 diabetes mellitus, CAD, hypertension, history of breast cysts status post lumpectomy in 08/2015 and admitted for acute upper GI bleed and septic shock.    PT Comments    Pt OOB in recliner on 2 lts nasal at 99%.  RN just assisted on/off BSC.  Pt c/o weakness/fatigue.  Removed O2 for trial and assisted with amb a greater distance in hallway.  Used rollator walker as that is what pt used prior.  RA avg 99% with highest HR 112.  Tolerated increased distance and was steady.  Pt admits she does not get out much but was able to get around her apt and perform lite house keeping.  Daughter gets groceries and meds.  Pt appears close to prior level and wishes to return home vs SNF.  Will consult LPT .  Follow Up Recommendations  Home health PT (will update LPT pt's wishes to return to her apt.  Has a life line and aide 3 hrs/day)     Equipment Recommendations   (has a rollator at home)    Recommendations for Other Services       Precautions / Restrictions Precautions Precautions: Fall Restrictions Weight Bearing Restrictions: No    Mobility  Bed Mobility               General bed mobility comments: Pt OOB in recliner  Transfers Overall transfer level: Needs assistance Equipment used: None Transfers: Sit to/from Stand Sit to Stand: Supervision;Min guard         General transfer comment: pt able to self rise from recliner with use B UE's to push self up and transfer hands to walker safely.    Ambulation/Gait Ambulation/Gait assistance: Min guard Ambulation Distance (Feet): 110 Feet (55 feet x 2 one sitting rest break) Assistive device: 4-wheeled walker Gait Pattern/deviations: Step-through pattern;Decreased stride length Gait velocity:  decreased   General Gait Details: used rollator walker pt tolerated amb a greater distance.  Required one sitting rest break.  RA avg 98% with highest HR 112.  Mild dyspnea and Moc c/o weakness/fatigue.  Steady.  No LOB. Pt is aware of her mobility capacity.  Appears close to prior level.     Stairs            Wheelchair Mobility    Modified Rankin (Stroke Patients Only)       Balance                                    Cognition Arousal/Alertness: Awake/alert Behavior During Therapy: WFL for tasks assessed/performed Overall Cognitive Status: Within Functional Limits for tasks assessed                      Exercises      General Comments        Pertinent Vitals/Pain Pain Assessment: No/denies pain    Home Living                      Prior Function            PT Goals (current goals can now be found in the care plan section) Progress towards PT goals: Progressing toward goals    Frequency  PT Plan Current plan remains appropriate    Co-evaluation             End of Session Equipment Utilized During Treatment: Gait belt Activity Tolerance: Patient tolerated treatment well Patient left: in chair;with call bell/phone within reach;with chair alarm set     Time: [id number] PT Time Calculation (min) (ACUTE ONLY): 34 min  Charges:  $Gait Training: 8-22 mins $Therapeutic Activity: 8-22 mins                    G Codes:      Rica Koyanagi  PTA [facility]  Acute  Rehab Pager      [phone]

## 2015-11-09 DIAGNOSIS — I10 Essential (primary) hypertension: Secondary | ICD-10-CM

## 2015-11-09 LAB — GLUCOSE, CAPILLARY
Glucose-Capillary: 98 mg/dL (ref 65–99)
Glucose-Capillary: 108 mg/dL — ABNORMAL HIGH (ref 65–99)
Glucose-Capillary: 244 mg/dL — ABNORMAL HIGH (ref 65–99)

## 2015-11-09 MED ORDER — SACCHAROMYCES BOULARDII 250 MG PO CAPS: 250.0000 mg | ORAL_CAPSULE | Freq: Two times a day (BID) | ORAL | Status: DC

## 2015-11-09 MED ORDER — GLUCERNA SHAKE PO LIQD: 237.0000 mL | Freq: Three times a day (TID) | ORAL | Status: DC

## 2015-11-09 NOTE — Progress Notes (Signed)
Nursing Discharge Summary  Patient ID: Satrina Kieschnick MRN: [id number] DOB/AGE: [birth year] 75 y.o.  Admit date: 11/01/2015 Discharge date: 11/09/2015  Discharged Condition: good  Disposition: 01-Home or Self Care  Follow-up Information    Follow up with [facility].   Contact information:   [address] [place] [address] [phone]       Follow up with [name], MD. Schedule an appointment as soon as possible for a visit in 1 week.   Specialty:  Family Medicine   Why:  Hospital Follow Up   Contact information:   [address] [place] [address] [phone]       Follow up with Winfield Cunas, MD. Schedule an appointment as soon as possible for a visit in 1 month.   Specialty:  Gastroenterology   Why:  Hospital Follow Up - EGD biopsy results   Contact information:   [address] [place]  [address] [phone]       Prescriptions Given: Prescriptions called into the Pharmacy in [place].  Patient follow up appointments and medications discussed and daughter verbalized understanding.    Means of Discharge: Patient to be taken downstairs via wheelchair to be discharged home via private vehicle.  Signed: Buel Ream 11/09/2015, 2:54 PM

## 2015-11-09 NOTE — Progress Notes (Signed)
[facility]  Patient Status: New  [facility] is providing the following services: RN, PT, OT, MSW and HHA  If patient discharges after hours, please call [phone].   [name] 11/09/2015, 10:59 AM

## 2015-11-09 NOTE — Progress Notes (Signed)
Physical Therapy Treatment Patient Details Name: [name] MRN: [id number] DOB: [birth year] Today's Date: 11/09/2015    History of Present Illness 75 year old female with history of mild dementia, schizophrenia, insulin-dependent type 2 diabetes mellitus, CAD, hypertension, history of breast cysts status post lumpectomy in 08/2015 and admitted for acute upper GI bleed and septic shock.    PT Comments    Pt OOB in recliner.  Assisted with amb a greater using Rollator walker and one sitting rest break.  RA 100%.  Assisted on/off BSC then back to recliner positioned to comfort.  Back issues.  Pt is eager to D/C to home.    Follow Up Recommendations  Home health PT     Equipment Recommendations       Recommendations for Other Services       Precautions / Restrictions Precautions Precautions: Fall Restrictions Weight Bearing Restrictions: No    Mobility  Bed Mobility               General bed mobility comments: Pt OOB in recliner sleeping  Transfers Overall transfer level: Needs assistance Equipment used: None Transfers: Sit to/from Stand Sit to Stand: Supervision;Min guard         General transfer comment: pt able to self rise from recliner with use B UE's to push self up and transfer hands to walker safely.    also assisted on/off BSC  Ambulation/Gait Ambulation/Gait assistance: Min guard;Supervision Ambulation Distance (Feet): 140 Feet (120 x 2 one sitting rest break) Assistive device: 4-wheeled walker       General Gait Details: used rollator walker pt tolerated amb a greater distance.  Required one sitting rest break.  RA avg 98% with highest HR 112.  Mild dyspnea and Mod c/o weakness/fatigue.  Steady.  No LOB. Pt is aware of her mobility capacity.  Appears close to prior level.     Stairs            Wheelchair Mobility    Modified Rankin (Stroke Patients Only)       Balance                                    Cognition  Arousal/Alertness: Awake/alert Behavior During Therapy: WFL for tasks assessed/performed Overall Cognitive Status: Within Functional Limits for tasks assessed                      Exercises      General Comments        Pertinent Vitals/Pain Pain Assessment: Faces Pain Score: 2  Faces Pain Scale: Hurts a little bit Pain Location: back Pain Descriptors / Indicators: Aching;Grimacing Pain Intervention(s): Monitored during session;Repositioned    Home Living                      Prior Function            PT Goals (current goals can now be found in the care plan section) Progress towards PT goals: Progressing toward goals    Frequency       PT Plan Current plan remains appropriate    Co-evaluation             End of Session Equipment Utilized During Treatment: Gait belt Activity Tolerance: Patient tolerated treatment well Patient left: in chair;with call bell/phone within reach;with chair alarm set     Time: 1122-1150 PT Time Calculation (min) (ACUTE ONLY): 28  min  Charges:  $Gait Training: 8-22 mins $Therapeutic Activity: 8-22 mins                    G Codes:      Rica Koyanagi  PTA [facility]  Acute  Rehab Pager      [phone]

## 2015-11-09 NOTE — Discharge Summary (Signed)
Physician Discharge Summary  Chyanne Paddock [id number] DOB: [birth year] DOA: 11/01/2015  PCP: [name], MD  Admit date: 11/01/2015 Discharge date: 11/09/2015  Admitted From: Home Disposition:  Home  Recommendations for Outpatient Follow-up:  1. Follow up with PCP in 1 weeks 2. Please obtain BMP/CBC in one week  Discharge Condition:Stable  CODE STATUS: FULL Diet recommendation: Diet recommendations: Dysphagia 3 (mechanical soft);Thin liquid Liquids provided via: Cup;Straw Medication Administration: Whole meds with liquid Supervision: Patient able to self feed Compensations: Slow rate;Small sips/bites;Other (Comment) (consume liquids throughout meals) Postural Changes and/or Swallow Maneuvers: Seated upright 90 degrees; Upright 30-60 min after meal           Brief/Interim Summary: 75 year old female with history of mild dementia, schizophrenia, insulin-dependent type 2 diabetes mellitus, CAD, hypertension, history of breast cysts status post lumpectomy in 08/2015 who is a resident of a left was sent to [facility] ED with several days of coffee-ground emesis and dark stools. As per EMS patient was orthostatic with blood pressure of 86/48 mmHg and heart rate of 132. She was also hypoxic with O2 sat of 87% on room air. FOBT was positive. Patient was septic with leukocytosis and lactic acidosis, acute kidney injury and abdominal x-ray with chest showing right upper and lower lobe infiltrate. Patient admitted to stepdown unit With septic shock. Subsequent hemoglobin dropped to 6.7 and wasTransfused with 2 units PRBC. EGD done showed esophageal candidiasis without ulceration or source of bleeding.   Assessment & Plan :   Principal problem Acute upper GI bleed EGD done.  Surgical pathology shows an ulcer. (may be the source for bleeding) Fungal culture pending. Hemoglobin improved after transfusion. Continue oral PPI. Counseled on smoking cessation and avoiding  NSAIDs.  Septic shock with acute hypoxic respiratory failure secondary to multilobar pneumonia. Suspect aspiration. Two-view chest x-ray showing progressive asymmetry bilaterally is disease with pleural effusions. Completed empiric antibiotics for 8 days. Cultures so far negative.   Severe Esophageal candidiasis Biopsy shows ulcer. cultures pending. Completed course of fluconazole x 5 days.  Dysphagia Tolerating dys 3 diet.    Insulin-dependent type 2 diabetes mellitus Off Lantus. on sliding scale coverage. A1c of 6. Did not resume lantus at discharge.  Did not resume actos.  Pt blood sugars good without being on those meds.   CBG (last 3)   Recent Labs (last 2 labs)      Recent Labs  11/07/15 2128 11/08/15 0743 11/08/15 1214  GLUCAP 147* 130* 164*     Essential hypertension Continue home blood pressure medications.  Prolonged QTC  haldol resumed after being held for few days ( Qtc normalized)  Acute kidney injury Suspect prerenal due to septic shock. Resolved with fluids.  Hypokalemia Replenished  Metabolic acidosis secondary to dehydration/ sepsis with lactic acidosis. Resolved now.  Poor IV access: PICC line placed on 7/16.  DC prior to discharge.     Code Status : Full code  Family Communication : called daughter valerie and left a msg  Disposition Plan : Plan DC home 7/20.   Barriers For Discharge : needs SNF and d/w family (Pt has decided against SNF and would go home with [facility])   Consults : [facility]  Procedures : EGD  DVT Prophylaxis : SCDs      Discharge Diagnoses:  Principal Problem:   Septic shock ([place]) Active Problems:   Diabetes mellitus, type 2 ([place])   Schizophrenia, unspecified type ([place])   Essential hypertension   TOBACCO USE, QUIT   CAP (community acquired pneumonia)  Upper GI bleed   GI bleed   Acute blood loss anemia   Acute kidney injury [facility])  Discharge Instructions      Discharge Instructions     Increase activity slowly    Complete by:  As directed             Medication List    STOP taking these medications        ciprofloxacin 500 MG tablet  Commonly known as:  CIPRO     insulin glargine 100 UNIT/ML injection  Commonly known as:  LANTUS SOLOSTAR     levocetirizine 5 MG tablet  Commonly known as:  XYZAL     mirtazapine 30 MG tablet  Commonly known as:  REMERON     omeprazole 40 MG capsule  Commonly known as:  PRILOSEC     pioglitazone 30 MG tablet  Commonly known as:  ACTOS     ramipril 10 MG capsule  Commonly known as:  ALTACE     sulfamethoxazole-trimethoprim 800-160 MG tablet  Commonly known as:  BACTRIM DS,SEPTRA DS     traZODone 100 MG tablet  Commonly known as:  DESYREL      TAKE these medications        acetaminophen 500 MG tablet  Commonly known as:  TYLENOL  Take 500-1,000 mg by mouth 3 (three) times daily as needed. For back pain     amLODipine 5 MG tablet  Commonly known as:  NORVASC  Take 5 mg by mouth daily.     aspirin 325 MG tablet  Take 325 mg by mouth daily.     atorvastatin 40 MG tablet  Commonly known as:  LIPITOR  Take 40 mg by mouth daily.     bismuth subsalicylate [id number] suspension  Commonly known as:  PEPTO BISMOL  Take 30 mLs by mouth every 6 (six) hours as needed for indigestion or diarrhea or loose stools.     cloZAPine 100 MG tablet  Commonly known as:  CLOZARIL  Take 1 tablet (100 mg total) by mouth at bedtime.     donepezil 10 MG tablet  Commonly known as:  ARICEPT  Take 10 mg by mouth at bedtime.     esomeprazole 40 MG capsule  Commonly known as:  NEXIUM  Take 40 mg by mouth daily.     feeding supplement (GLUCERNA SHAKE) Liqd  Take 237 mLs by mouth 3 (three) times daily between meals.     fluticasone 50 MCG/ACT nasal spray  Commonly known as:  FLONASE  Place 2 sprays into both nostrils daily as needed for allergies.     furosemide 40 MG tablet  Commonly known as:  LASIX  Take 20 mg by mouth  daily.     gabapentin 100 MG capsule  Commonly known as:  NEURONTIN  Take 100 mg by mouth 4 (four) times daily.     haloperidol 5 MG tablet  Commonly known as:  HALDOL  Take 5-10 mg by mouth 2 (two) times daily. Take 1 in the morning and 2 at night     HYDROcodone-acetaminophen 5-325 MG tablet  Commonly known as:  NORCO/VICODIN  Take 1 tablet by mouth every 4 (four) hours as needed.     memantine 10 MG tablet  Commonly known as:  NAMENDA  Take 1 tablet (10 mg total) by mouth 2 (two) times daily.     multivitamin,tx-minerals tablet  Take 1 tablet by mouth daily.     potassium chloride 8 MEQ tablet  Commonly known as:  KLOR-CON  Take 1 tablet (8 mEq total) by mouth daily.     saccharomyces boulardii 250 MG capsule  Commonly known as:  FLORASTOR  Take 1 capsule (250 mg total) by mouth 2 (two) times daily.     valsartan 160 MG tablet  Commonly known as:  DIOVAN  Take 160 mg by mouth daily.       Follow-up Information    Follow up with [facility].   Contact information:   [address] [place] [address] [phone]       Follow up with [name], MD. Schedule an appointment as soon as possible for a visit in 1 week.   Specialty:  Family Medicine   Why:  Hospital Follow Up   Contact information:   [address] [place]  [address] [phone]      Allergies  Allergen Reactions  . Codeine Phosphate Other (See Comments)    REACTION: unspecified  . Glimepiride Other (See Comments)    REACTION: unspecified  . Glyburide Other (See Comments)    REACTION: unspecified  . Levofloxacin Nausea Only    Made her weak and nauseated  . Shellfish Allergy Swelling  . Tramadol Other (See Comments)    unknown   Procedures/Studies: Dg Chest 2 View  11/05/2015  CLINICAL DATA:  Lethargy. EXAM: CHEST  2 VIEW COMPARISON:  11/01/2015 FINDINGS: AP and lateral views of the chest show bilateral asymmetric airspace disease,  right mid lung and retrocardiac left base with probable small pleural effusions. Disease is progressive in the interval. The cardiopericardial silhouette is within normal limits for size. The visualized bony structures of the thorax are intact. IMPRESSION: Progression of asymmetric bilateral airspace disease with pleural effusions. Asymmetric edema versus diffuse infection. Electronically Signed   By: Misty Stanley M.D.   On: 11/05/2015 11:07   Ct Head Wo Contrast  11/01/2015  CLINICAL DATA:  75 year old female with confusion EXAM: CT HEAD WITHOUT CONTRAST TECHNIQUE: Contiguous axial images were obtained from the base of the skull through the vertex without intravenous contrast. COMPARISON:  CT dated 12/26/2014 FINDINGS: There is mild age-related atrophy and chronic microvascular ischemic changes. There is no acute intracranial hemorrhage. No mass effect or midline shift noted. The visualized paranasal sinuses and mastoid air cells are clear. The calvarium is intact. IMPRESSION: No acute intracranial hemorrhage. Mild age-related atrophy and chronic microvascular ischemic disease. If symptoms persist and there are no contraindications, MRI may provide better evaluation if clinically indicated Electronically Signed   By: Anner Crete M.D.   On: 11/01/2015 21:51   Dg Abd Acute W/chest  11/01/2015  CLINICAL DATA:  Peptic ulcer disease, concern for GI bleed, check for free air. Abdominal pain. EXAM: DG ABDOMEN ACUTE W/ 1V CHEST COMPARISON:  08/22/2015 FINDINGS: Patchy airspace disease in the right upper lobe and right lower lobe, possible pneumonia. This was not present on the recent study. No effusion. Left lung clear. Nonobstructive bowel gas pattern.  No free air. Surgical clips in the gallbladder fossa. Lumbar levoscoliosis. No acute skeletal abnormality. No urinary tract calculi. IMPRESSION: Infiltrate in the right lower lobe and right upper lobe, suspicious for pneumonia Normal bowel gas pattern.  No free  air. Electronically Signed   By: Franchot Gallo M.D.   On: 11/01/2015 13:29    Subjective: Pt ambulating much better.  Pt without complaints.   Says she would like to go home, again declines SNF placement.   Discharge Exam: Danley Danker  Vitals:   11/08/15 2114 11/09/15 0519  BP: 155/77 147/89  Pulse: 102 101  Temp: 98.7 F (37.1 C) 98.5 F (36.9 C)  Resp: 18 18   Filed Vitals:   11/08/15 1419 11/08/15 2114 11/09/15 0519 11/09/15 1132  BP: 152/59 155/77 147/89   Pulse: 97 102 101   Temp: 98.7 F (37.1 C) 98.7 F (37.1 C) 98.5 F (36.9 C)   TempSrc: Oral Oral Oral   Resp: 18 18 18    Height:      Weight:      SpO2: 98% 98% 94% 100%    [id number], not in distress HEENT: moist mucosa, supple neck,  Chest: clear b/l No rhonchi or wheeze CVS: N S1&S2, no murmurs, rubs or gallop GI: soft, NT, ND, BS+ Musculoskeletal: warm, no edema CNS: Alert and oriented  The results of significant diagnostics from this hospitalization (including imaging, microbiology, ancillary and laboratory) are listed below for reference.     Microbiology: Recent Results (from the past 240 hour(s))  Blood Culture (routine x 2)     Status: None   Collection Time: 11/01/15  2:07 PM  Result Value Ref Range Status   Specimen Description BLOOD BLOOD RIGHT FOREARM  Final   Special Requests IN PEDIATRIC BOTTLE 3 CC  Final   Culture   Final    NO GROWTH 5 DAYS Performed at [facility]    Report Status 11/06/2015 FINAL  Final  Urine culture     Status: Abnormal   Collection Time: 11/01/15  3:54 PM  Result Value Ref Range Status   Specimen Description URINE, CATHETERIZED  Final   Special Requests NONE  Final   Culture (A)  Final    1,000 COLONIES/mL INSIGNIFICANT GROWTH Performed at [facility]    Report Status 11/02/2015 FINAL  Final  MRSA PCR Screening     Status: None   Collection Time: 11/01/15  5:27 PM  Result Value Ref Range Status   MRSA by PCR NEGATIVE NEGATIVE Final     Comment:        The GeneXpert MRSA Assay (FDA approved for NASAL specimens only), is one component of a comprehensive MRSA colonization surveillance program. It is not intended to diagnose MRSA infection nor to guide or monitor treatment for MRSA infections.   Culture, blood (routine x 2)     Status: None   Collection Time: 11/01/15  8:05 PM  Result Value Ref Range Status   Specimen Description BLOOD LEFT HAND  Final   Special Requests IN PEDIATRIC BOTTLE 3CC  Final   Culture   Final    NO GROWTH 5 DAYS Performed at [facility]    Report Status 11/06/2015 FINAL  Final  Culture, blood (routine x 2)     Status: None   Collection Time: 11/01/15  8:17 PM  Result Value Ref Range Status   Specimen Description BLOOD LEFT ANTECUBITAL  Final   Special Requests IN PEDIATRIC BOTTLE [facility]  Final   Culture   Final    NO GROWTH 5 DAYS Performed at [facility]    Report Status 11/06/2015 FINAL  Final     Labs: BNP (last 3 results)  Recent Labs  12/09/14 1415 12/10/14 2311  BNP 83.0 123456*   Basic Metabolic Panel:  Recent Labs Lab 11/03/15 0303 11/04/15 0352 11/06/15 0515 11/07/15 0526  NA 146* 144 144 142  K 3.8 3.0* 3.3* 4.4  CL 122* 116* 111 111  CO2 17* 19* 24 26  GLUCOSE 96 107* 107* 96  BUN 26* 14 9 7   CREATININE 0.85 0.62 0.83 0.70  CALCIUM 8.9 8.7* 9.0 8.5*   Liver Function Tests: No results for input(s): AST, ALT, ALKPHOS, BILITOT, PROT, ALBUMIN in the last 168 hours. No results for input(s): LIPASE, AMYLASE in the last 168 hours. No results for input(s): AMMONIA in the last 168 hours. CBC:  Recent Labs Lab 11/04/15 0352 11/05/15 0408 11/06/15 0515 11/07/15 1035 11/08/15 0550  WBC 20.5* 16.5* 17.1* 13.0* 11.2*  NEUTROABS  --   --   --   --  6.9  HGB 10.6* 9.7* 10.3* 9.3* 8.5*  HCT 30.8* 29.5* 30.5* 28.0* 25.7*  MCV 93.9 95.8 95.9 97.2 97.7  PLT 206 202 260 302 330   Cardiac Enzymes: No results for input(s): CKTOTAL, CKMB,  CKMBINDEX, TROPONINI in the last 168 hours. BNP: Invalid input(s): POCBNP CBG:  Recent Labs Lab 11/08/15 1214 11/08/15 1715 11/08/15 2201 11/09/15 0526 11/09/15 0749  GLUCAP 164* 166* 98 98 108*   D-Dimer No results for input(s): DDIMER in the last 72 hours. Hgb A1c No results for input(s): HGBA1C in the last 72 hours. Lipid Profile No results for input(s): CHOL, HDL, LDLCALC, TRIG, CHOLHDL, LDLDIRECT in the last 72 hours. Thyroid function studies No results for input(s): TSH, T4TOTAL, T3FREE, THYROIDAB in the last 72 hours.  Invalid input(s): FREET3 Anemia work up No results for input(s): VITAMINB12, FOLATE, FERRITIN, TIBC, IRON, RETICCTPCT in the last 72 hours. Urinalysis    Component Value Date/Time   COLORURINE AMBER* 11/01/2015 1554   APPEARANCEUR CLOUDY* 11/01/2015 1554   LABSPEC 1.035* 11/01/2015 1554   PHURINE 5.5 11/01/2015 1554   GLUCOSEU NEGATIVE 11/01/2015 1554   GLUCOSEU NEGATIVE 06/22/2008 0957   HGBUR NEGATIVE 11/01/2015 1554   HGBUR negative 12/12/2009 1102   BILIRUBINUR MODERATE* 11/01/2015 1554   BILIRUBINUR small 11/09/2010 1155   KETONESUR NEGATIVE 11/01/2015 1554   PROTEINUR NEGATIVE 11/01/2015 1554   PROTEINUR trace 11/09/2010 1155   UROBILINOGEN 1.0 12/12/2014 1100   UROBILINOGEN 1.0 11/09/2010 1155   NITRITE NEGATIVE 11/01/2015 1554   NITRITE n 11/09/2010 1155   LEUKOCYTESUR MODERATE* 11/01/2015 1554   Sepsis Labs Invalid input(s): PROCALCITONIN,  WBC,  LACTICIDVEN Microbiology Recent Results (from the past 240 hour(s))  Blood Culture (routine x 2)     Status: None   Collection Time: 11/01/15  2:07 PM  Result Value Ref Range Status   Specimen Description BLOOD BLOOD RIGHT FOREARM  Final   Special Requests IN PEDIATRIC BOTTLE 3 CC  Final   Culture   Final    NO GROWTH 5 DAYS Performed at [facility]    Report Status 11/06/2015 FINAL  Final  Urine culture     Status: Abnormal   Collection Time: 11/01/15  3:54 PM  Result  Value Ref Range Status   Specimen Description URINE, CATHETERIZED  Final   Special Requests NONE  Final   Culture (A)  Final    1,000 COLONIES/mL INSIGNIFICANT GROWTH Performed at [facility]    Report Status 11/02/2015 FINAL  Final  MRSA PCR Screening     Status: None   Collection Time: 11/01/15  5:27 PM  Result Value Ref Range Status   MRSA by PCR NEGATIVE NEGATIVE Final    Comment:        The GeneXpert MRSA Assay (FDA approved for NASAL specimens only), is one component of a comprehensive MRSA colonization surveillance program. It is not intended to diagnose MRSA infection nor to  guide or monitor treatment for MRSA infections.   Culture, blood (routine x 2)     Status: None   Collection Time: 11/01/15  8:05 PM  Result Value Ref Range Status   Specimen Description BLOOD LEFT HAND  Final   Special Requests IN PEDIATRIC BOTTLE 3CC  Final   Culture   Final    NO GROWTH 5 DAYS Performed at [facility]    Report Status 11/06/2015 FINAL  Final  Culture, blood (routine x 2)     Status: None   Collection Time: 11/01/15  8:17 PM  Result Value Ref Range Status   Specimen Description BLOOD LEFT ANTECUBITAL  Final   Special Requests IN PEDIATRIC BOTTLE [facility]  Final   Culture   Final    NO GROWTH 5 DAYS Performed at [facility]    Report Status 11/06/2015 FINAL  Final   Time coordinating discharge: 34 minutes  SIGNED:  Irwin Brakeman, MD  [facility] 11/09/2015, 11:41 AM Pager   If 7PM-7AM, please contact night-coverage [email or web address] Password [email or web address]

## 2015-11-09 NOTE — Progress Notes (Addendum)
Speech Language Pathology Treatment:    Patient Details Name: [name] MRN: [id number] DOB: 07-Jul-1940 Today's Date: 11/09/2015 Time: [id number] SLP Time Calculation (min) (ACUTE ONLY): 19 min  Assessment / Plan / Recommendation Clinical Impression  Pt reports improved swallow ability today and readiness to advance diet.   Pt observed consuming fruit and water - no indication of airway compromise.  However after a few bites of fruit pt reports lodging in proximal esophagus. SLP encouraged her to consume liquids to facilitate clearance- which was effective per pt statement.  Pt able to verbalize dysphagia precautions today with mod I - much improvement over prior session!      Reviewed aspiration/dysphagia mitigation strategies.  Suspect swallow function to be near baseline which pt confirms. Diet advancement to soft/ground meats/thin recommended.  If pt remains in hospital, will follow up x1 to assure tolerance.   Encouraged pt to take swallow precaution signs and esophageal information with her.     HPI HPI: 75 year old female who has been in the ALF. She has a history of insulin-dependent type II diabetes, mild dementia, and schizophrenia. She has had previous surgery for breast cancer and apparently did not need chemoradiation therapy back in May. Her main ongoing complaint is chronic back pain. According to her into her daughter she takes a large amount of ibuprofen. She admits to taking ibuprofen at least 2 to 4 times daily for chronic back pain which has been going on for several years. She developed hematemesis at the assisted living facility was brought to the emergency room and was slightly orthostatic with systolic blood pressure 86 in heart rate 132. She was also slightly hypoxic. Her stool was positive for blood was dark. Hemoglobin was 10.2 and drop to 6.7 with hydration. She was profoundly iron deficient. She denies ever having a history of ulcers or previous colonoscopy. She denies  EGD. It's notable that she was on Nexium is outpatient for esophageal reflux and presumably she had been taking. She does complain of reflux and epigastric pain. She has no family history: cancer to the best of her knowledge      SLP Plan  Continue with current plan of care     Recommendations  Diet recommendations: Dysphagia 3 (mechanical soft);Thin liquid Liquids provided via: Cup;Straw Medication Administration: Whole meds with liquid Supervision: Patient able to self feed Compensations: Slow rate;Small sips/bites;Other (Comment) (consume liquids throughout meals) Postural Changes and/or Swallow Maneuvers: Seated upright 90 degrees;Upright 30-60 min after meal             Oral Care Recommendations: Oral care BID Plan: Continue with current plan of care     [place], [facility] SLP [phone]

## 2015-11-15 DIAGNOSIS — D649 Anemia, unspecified: Secondary | ICD-10-CM | POA: Diagnosis not present

## 2015-11-15 DIAGNOSIS — F0390 Unspecified dementia without behavioral disturbance: Secondary | ICD-10-CM | POA: Diagnosis not present

## 2015-11-15 DIAGNOSIS — K922 Gastrointestinal hemorrhage, unspecified: Secondary | ICD-10-CM | POA: Diagnosis not present

## 2015-11-15 DIAGNOSIS — E1165 Type 2 diabetes mellitus with hyperglycemia: Secondary | ICD-10-CM | POA: Diagnosis not present

## 2015-11-15 DIAGNOSIS — I10 Essential (primary) hypertension: Secondary | ICD-10-CM | POA: Diagnosis not present

## 2015-11-15 DIAGNOSIS — Z794 Long term (current) use of insulin: Secondary | ICD-10-CM | POA: Diagnosis not present

## 2015-11-20 DIAGNOSIS — D649 Anemia, unspecified: Secondary | ICD-10-CM | POA: Diagnosis not present

## 2015-11-20 DIAGNOSIS — I951 Orthostatic hypotension: Secondary | ICD-10-CM | POA: Diagnosis not present

## 2015-11-20 DIAGNOSIS — Z7982 Long term (current) use of aspirin: Secondary | ICD-10-CM | POA: Diagnosis not present

## 2015-11-20 DIAGNOSIS — R1310 Dysphagia, unspecified: Secondary | ICD-10-CM | POA: Diagnosis not present

## 2015-11-20 DIAGNOSIS — I2510 Atherosclerotic heart disease of native coronary artery without angina pectoris: Secondary | ICD-10-CM | POA: Diagnosis not present

## 2015-11-20 DIAGNOSIS — E785 Hyperlipidemia, unspecified: Secondary | ICD-10-CM | POA: Diagnosis not present

## 2015-11-20 DIAGNOSIS — F209 Schizophrenia, unspecified: Secondary | ICD-10-CM | POA: Diagnosis not present

## 2015-11-20 DIAGNOSIS — F0390 Unspecified dementia without behavioral disturbance: Secondary | ICD-10-CM | POA: Diagnosis not present

## 2015-11-20 DIAGNOSIS — K589 Irritable bowel syndrome without diarrhea: Secondary | ICD-10-CM | POA: Diagnosis not present

## 2015-11-20 DIAGNOSIS — Z853 Personal history of malignant neoplasm of breast: Secondary | ICD-10-CM | POA: Diagnosis not present

## 2015-11-20 DIAGNOSIS — K922 Gastrointestinal hemorrhage, unspecified: Secondary | ICD-10-CM | POA: Diagnosis not present

## 2015-11-20 DIAGNOSIS — I10 Essential (primary) hypertension: Secondary | ICD-10-CM | POA: Diagnosis not present

## 2015-11-20 DIAGNOSIS — Z87891 Personal history of nicotine dependence: Secondary | ICD-10-CM | POA: Diagnosis not present

## 2015-11-20 DIAGNOSIS — I739 Peripheral vascular disease, unspecified: Secondary | ICD-10-CM | POA: Diagnosis not present

## 2015-11-20 DIAGNOSIS — Z794 Long term (current) use of insulin: Secondary | ICD-10-CM | POA: Diagnosis not present

## 2015-11-20 DIAGNOSIS — B3781 Candidal esophagitis: Secondary | ICD-10-CM | POA: Diagnosis not present

## 2015-11-20 DIAGNOSIS — E1142 Type 2 diabetes mellitus with diabetic polyneuropathy: Secondary | ICD-10-CM | POA: Diagnosis not present

## 2015-11-21 ENCOUNTER — Emergency Department (HOSPITAL_COMMUNITY): Payer: Medicare Other

## 2015-11-21 ENCOUNTER — Encounter (HOSPITAL_COMMUNITY): Payer: Self-pay

## 2015-11-21 ENCOUNTER — Emergency Department (HOSPITAL_COMMUNITY)
Admission: EM | Admit: 2015-11-21 | Discharge: 2015-11-21 | Disposition: A | Discharge status: Home / Self Care | Payer: Medicare Other | Attending: Emergency Medicine | Admitting: Emergency Medicine

## 2015-11-21 DIAGNOSIS — I10 Essential (primary) hypertension: Secondary | ICD-10-CM | POA: Insufficient documentation

## 2015-11-21 DIAGNOSIS — I2510 Atherosclerotic heart disease of native coronary artery without angina pectoris: Secondary | ICD-10-CM | POA: Diagnosis not present

## 2015-11-21 DIAGNOSIS — E119 Type 2 diabetes mellitus without complications: Secondary | ICD-10-CM | POA: Diagnosis not present

## 2015-11-21 DIAGNOSIS — R05 Cough: Secondary | ICD-10-CM | POA: Diagnosis not present

## 2015-11-21 DIAGNOSIS — Z87891 Personal history of nicotine dependence: Secondary | ICD-10-CM | POA: Diagnosis not present

## 2015-11-21 DIAGNOSIS — Z853 Personal history of malignant neoplasm of breast: Secondary | ICD-10-CM | POA: Diagnosis not present

## 2015-11-21 DIAGNOSIS — Z79899 Other long term (current) drug therapy: Secondary | ICD-10-CM | POA: Insufficient documentation

## 2015-11-21 DIAGNOSIS — R0602 Shortness of breath: Secondary | ICD-10-CM | POA: Diagnosis not present

## 2015-11-21 DIAGNOSIS — R42 Dizziness and giddiness: Secondary | ICD-10-CM | POA: Diagnosis not present

## 2015-11-21 DIAGNOSIS — R404 Transient alteration of awareness: Secondary | ICD-10-CM | POA: Diagnosis not present

## 2015-11-21 DIAGNOSIS — Z7984 Long term (current) use of oral hypoglycemic drugs: Secondary | ICD-10-CM | POA: Diagnosis not present

## 2015-11-21 DIAGNOSIS — R531 Weakness: Secondary | ICD-10-CM

## 2015-11-21 LAB — URINALYSIS, ROUTINE W REFLEX MICROSCOPIC
Bilirubin Urine: NEGATIVE
GLUCOSE, UA: NEGATIVE mg/dL
HGB URINE DIPSTICK: NEGATIVE
KETONES UR: NEGATIVE mg/dL
LEUKOCYTES UA: NEGATIVE
Nitrite: NEGATIVE
PH: 7.5 (ref 5.0–8.0)
Protein, ur: NEGATIVE mg/dL
Specific Gravity, Urine: 1.013 (ref 1.005–1.030)

## 2015-11-21 LAB — COMPREHENSIVE METABOLIC PANEL
ALBUMIN: 3.1 g/dL — AB (ref 3.5–5.0)
ALK PHOS: 96 U/L (ref 38–126)
ALT: 13 U/L — ABNORMAL LOW (ref 14–54)
ANION GAP: 7 (ref 5–15)
AST: 16 U/L (ref 15–41)
BILIRUBIN TOTAL: 0.7 mg/dL (ref 0.3–1.2)
BUN: 7 mg/dL (ref 6–20)
CALCIUM: 8.8 mg/dL — AB (ref 8.9–10.3)
CO2: 27 mmol/L (ref 22–32)
Chloride: 110 mmol/L (ref 101–111)
Creatinine, Ser: 0.75 mg/dL (ref 0.44–1.00)
GFR calc Af Amer: >60 mL/min (ref >60)
GFR calc non Af Amer: >60 mL/min (ref >60)
GLUCOSE: 113 mg/dL — AB (ref 65–99)
Potassium: 3.5 mmol/L (ref 3.5–5.1)
SODIUM: 144 mmol/L (ref 135–145)
Total Protein: 7.1 g/dL (ref 6.5–8.1)

## 2015-11-21 LAB — CBC WITH DIFFERENTIAL/PLATELET
BASOS ABS: 0 10*3/uL (ref 0.0–0.1)
BASOS PCT: 0 %
EOS ABS: 0.1 10*3/uL (ref 0.0–0.7)
Eosinophils Relative: 1 %
HEMATOCRIT: 35.8 % — AB (ref 36.0–46.0)
HEMOGLOBIN: 11.2 g/dL — AB (ref 12.0–15.0)
Lymphocytes Relative: 22 %
Lymphs Abs: 1.7 10*3/uL (ref 0.7–4.0)
MCH: 30.8 pg (ref 26.0–34.0)
MCHC: 31.3 g/dL (ref 30.0–36.0)
MCV: 98.4 fL (ref 78.0–100.0)
MONOS PCT: 10 %
Monocytes Absolute: 0.8 10*3/uL (ref 0.1–1.0)
NEUTROS ABS: 5.1 10*3/uL (ref 1.7–7.7)
NEUTROS PCT: 67 %
Platelets: 395 10*3/uL (ref 150–400)
RBC: 3.64 MIL/uL — AB (ref 3.87–5.11)
RDW: 15.6 % — ABNORMAL HIGH (ref 11.5–15.5)
WBC: 7.7 10*3/uL (ref 4.0–10.5)

## 2015-11-21 LAB — TROPONIN I: Troponin I: <0.03 ng/mL (ref <0.03)

## 2015-11-21 MED ORDER — SODIUM CHLORIDE 0.9 % IV BOLUS (SEPSIS)
500.0000 mL | Freq: Once | INTRAVENOUS | Status: AC
  Administered 2015-11-21: 500 mL via INTRAVENOUS

## 2015-11-21 NOTE — ED Triage Notes (Signed)
PT RECEIVED FROM [facility] FOR WEAKNESS AND DIZZINESS X2 WEEKS. PT STATES SHE WAS TREATED FOR PNEUMONIA 2 WEEKS AGO, AND SINCE SHE LEFT THE HOSPITAL SHE HAS BEEN VERY WEAK. PT WAS UNABLE TO ANSWER HER DOOR FOR THE AIDE THIS MORNING. [id number].

## 2015-11-21 NOTE — ED Notes (Signed)
Bed: WA19 Expected date:  Expected time:  Means of arrival:  Comments: EMS- [facility] 

## 2015-11-21 NOTE — ED Notes (Signed)
Pt walked to the restroom,tolerated ambulation well.

## 2015-11-21 NOTE — ED Provider Notes (Signed)
[facility] DEPT Provider Note   CSN: [id number] Arrival date & time: 11/21/15  1122  First Provider Contact:  First MD Initiated Contact with Patient 11/21/15 1157        History   Chief Complaint Chief Complaint  Patient presents with  . Weakness    HPI [name] is a 75 y.o. female.  Patient is brought to the emergency department for generalized weakness today.  She was recently hospitalized and they recommended rehabilitation center placement after inpatient treatment for pneumonia however the patient and family declined and she currently has home health at home.  Family reports that she felt weak today and was unable to get out of bed open the door for the home health nurse.  She states she's been eating and drinking normally.  She denies fevers and chills.  She denies cough or shortness of breath.  She denies urinary symptoms.  She reports that her appetite has been good.   The history is provided by the patient and medical records.       Past Medical History:  Diagnosis Date  . Allergic rhinitis   . Anemia, iron deficiency   . Anxiety   . Arthritis    back  . CAD (coronary artery disease)    CAD stent placed 2005  . Cancer ([place])    Left breast invasive ductal carcinoma  . Dementia   . Esophageal motility disorder   . GERD (gastroesophageal reflux disease)   . HTN (hypertension)   . Hyperlipidemia   . IBS (irritable bowel syndrome)   . Peripheral neuropathy ([place])   . Pneumonia 12-09-2014   adm with CAP requiring mechanical ventilation x 10d  . PVD (peripheral vascular disease) (HCC)    Left subclavian 70% stenosis  . Renal insufficiency   . 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 Syncope    EP study 1996 w/atrial tachycardia  . Type II or unspecified type diabetes mellitus without mention of complication, not stated as uncontrolled     Patient Active Problem List   Diagnosis Date Noted  . Septic shock ([place]) 11/02/2015  . Acute blood  loss anemia 11/02/2015  . Acute kidney injury ([place]) 11/02/2015  . Upper GI bleed 11/01/2015  . GI bleed 11/01/2015  . Breast cancer of upper-outer quadrant of left female breast ([place]) 08/29/2015  . Hypernatremia 12/23/2014  . Hypokalemia 12/23/2014  . Sepsis ([place]) 12/23/2014  . Anemia 12/23/2014  . Acute respiratory failure with hypoxemia ([place])   . Acute respiratory failure with hypoxia ([place]) 12/10/2014  . ARDS (adult respiratory distress syndrome) ([place]) 12/10/2014  . Acute encephalopathy   . Blood poisoning ([place])   . CAP (community acquired pneumonia) 12/09/2014  . DYSPHAGIA, PHARYNGOESOPHAGEAL PHASE 02/07/2009  . RENAL INSUFFICIENCY 02/05/2009  . TOBACCO USE, QUIT 02/03/2009  . VERTIGO 06/22/2008  . BACK PAIN 10/26/2007  . HYPERLIPIDEMIA 06/02/2007  . Dementia 06/02/2007  . Schizophrenia, unspecified type ([place]) 06/02/2007  . PERIPHERAL VASCULAR DISEASE 06/02/2007  . ALLERGIC RHINITIS 06/02/2007  . IBS 06/02/2007  . FATIGUE 06/02/2007  . PERIPHERAL EDEMA 06/02/2007  . Diabetes mellitus, type 2 ([place]) 09/04/2006  . ANEMIA-IRON DEFICIENCY 09/04/2006  . ANXIETY 09/04/2006  . PERIPHERAL NEUROPATHY 09/04/2006  . Essential hypertension 09/04/2006  . CORONARY ARTERY DISEASE 09/04/2006  . GERD 09/04/2006  . HX, PERSONAL, SCHIZOPHRENIA 09/04/2006    Past Surgical History:  Procedure Laterality Date  . ABDOMINAL HYSTERECTOMY    . BREAST BIOPSY     Negative  . ESOPHAGOGASTRODUODENOSCOPY N/A  11/02/2015   Procedure: ESOPHAGOGASTRODUODENOSCOPY (EGD);  Surgeon: Laurence Spates, MD;  Location: [facility];  Service: Endoscopy;  Laterality: N/A;  . JOINT REPLACEMENT Bilateral   . KNEE ARTHROPLASTY     Right   . PTCA  1995  . RADIOACTIVE SEED GUIDED MASTECTOMY WITH AXILLARY SENTINEL LYMPH NODE BIOPSY Left 08/17/2015   Procedure: RADIOACTIVE SEED GUIDED LUMPECTOMY WITH AXILLARY SENTINEL LYMPH NODE BIOPSY;  Surgeon: Donnie Mesa, MD;  Location: [facility];  Service:  General;  Laterality: Left;  . RE-EXCISION OF BREAST LUMPECTOMY Left 09/07/2015   Procedure: RE-EXCISION OF LEFT BREAST LUMPECTOMY;  Surgeon: Donnie Mesa, MD;  Location: [facility];  Service: General;  Laterality: Left;    OB History    No data available       Home Medications    Prior to Admission medications   Medication Sig Start Date End Date Taking? Authorizing Provider  acetaminophen (TYLENOL) 500 MG tablet Take 500-1,000 mg by mouth 3 (three) times daily as needed. For back pain    Yes Historical Provider, MD  amLODipine (NORVASC) 10 MG tablet Take 10 mg by mouth daily.   Yes Historical Provider, MD  atorvastatin (LIPITOR) 40 MG tablet Take 40 mg by mouth daily.   Yes Historical Provider, MD  bismuth subsalicylate (PEPTO BISMOL) 262 MG/15ML suspension Take 30 mLs by mouth every 6 (six) hours as needed for indigestion or diarrhea or loose stools.   Yes Historical Provider, MD  cloZAPine (CLOZARIL) 100 MG tablet Take 1 tablet (100 mg total) by mouth at bedtime. Patient taking differently: Take 150 mg by mouth at bedtime.  12/27/14  Yes Maryellen Pile, MD  donepezil (ARICEPT) 10 MG tablet Take 10 mg by mouth at bedtime.   Yes Historical Provider, MD  feeding supplement, GLUCERNA SHAKE, (GLUCERNA SHAKE) LIQD Take 237 mLs by mouth 3 (three) times daily between meals. Patient taking differently: Take 237 mLs by mouth daily.  11/09/15  Yes Clanford Marisa Hua, MD  furosemide (LASIX) 40 MG tablet Take 20 mg by mouth daily.    Yes Historical Provider, MD  haloperidol (HALDOL) 5 MG tablet Take 5-10 mg by mouth See admin instructions. Take 1 in the morning and 2 at night    Yes Historical Provider, MD  levocetirizine (XYZAL) 5 MG tablet Take 5 mg by mouth every evening.   Yes Historical Provider, MD  memantine (NAMENDA) 10 MG tablet Take 1 tablet (10 mg total) by mouth 2 (two) times daily. 10/09/12  Yes Neena Rhymes, MD  mirtazapine (REMERON) 30 MG tablet Take 30 mg by mouth at  bedtime.   Yes Historical Provider, MD  omeprazole (PRILOSEC) 40 MG capsule Take 40 mg by mouth daily.   Yes Historical Provider, MD  pantoprazole (PROTONIX) 40 MG tablet Take 40 mg by mouth daily.   Yes Historical Provider, MD  pioglitazone (ACTOS) 30 MG tablet Take 30 mg by mouth daily.   Yes Historical Provider, MD  potassium chloride (KLOR-CON) 8 MEQ tablet Take 1 tablet (8 mEq total) by mouth daily. 04/28/12  Yes Neena Rhymes, MD  ramipril (ALTACE) 10 MG capsule Take 10 mg by mouth daily.   Yes Historical Provider, MD  saccharomyces boulardii (FLORASTOR) 250 MG capsule Take 1 capsule (250 mg total) by mouth 2 (two) times daily. 11/09/15  Yes Clanford Marisa Hua, MD  traZODone (DESYREL) 100 MG tablet Take 100 mg by mouth at bedtime as needed for sleep (insomnia).   Yes Historical Provider, MD  valsartan (  DIOVAN) 160 MG tablet Take 160 mg by mouth daily.   Yes Historical Provider, MD    Family History Family History  Problem Relation Age of Onset  . Hypertension Other   . Diabetes Other   . Schizophrenia Neg Hx   . Coronary artery disease Neg Hx   . Colon cancer Neg Hx   . Breast cancer Neg Hx     Social History Social History  Substance Use Topics  . Smoking status: Former Research scientist (life sciences)  . Smokeless tobacco: Never Used  . Alcohol use No     Allergies   Codeine phosphate; Glimepiride; Glyburide; Levofloxacin; Shellfish allergy; and Tramadol   Review of Systems Review of Systems  All other systems reviewed and are negative.    Physical Exam Updated Vital Signs BP 157/66   Pulse 106   Temp 97.3 F (36.3 C) (Oral)   Resp 18   Ht 5' (1.524 m)   Wt 150 lb (68 kg)   SpO2 95%   BMI 29.29 kg/m   Physical Exam  Constitutional: She is oriented to person, place, and time. She appears well-developed and well-nourished. No distress.  HENT:  Head: Normocephalic and atraumatic.  Eyes: EOM are normal.  Neck: Normal range of motion.  Cardiovascular: Normal rate, regular rhythm  and normal heart sounds.   Pulmonary/Chest: Effort normal and breath sounds normal.  Abdominal: Soft. She exhibits no distension. There is no tenderness.  Musculoskeletal: Normal range of motion.  Neurological: She is alert and oriented to person, place, and time.  Skin: Skin is warm and dry.  Psychiatric: She has a normal mood and affect. Judgment normal.  Nursing note and vitals reviewed.    ED Treatments / Results  Labs (all labs ordered are listed, but only abnormal results are displayed) Labs Reviewed  CBC WITH DIFFERENTIAL/PLATELET - Abnormal; Notable for the following:       Result Value   RBC 3.64 (*)    Hemoglobin 11.2 (*)    HCT 35.8 (*)    RDW 15.6 (*)    All other components within normal limits  COMPREHENSIVE METABOLIC PANEL - Abnormal; Notable for the following:    Glucose, Bld 113 (*)    Calcium 8.8 (*)    Albumin 3.1 (*)    ALT 13 (*)    All other components within normal limits  URINALYSIS, ROUTINE W REFLEX MICROSCOPIC (NOT AT [facility]) - Abnormal; Notable for the following:    APPearance CLOUDY (*)    All other components within normal limits  TROPONIN I    EKG  EKG Interpretation  Date/Time:  Tuesday November 21 2015 11:34:45 EDT Ventricular Rate:  91 PR Interval:    QRS Duration: 85 QT Interval:  410 QTC Calculation: 505 R Axis:   -9 Text Interpretation:  Sinus rhythm Probable left atrial enlargement Prolonged QT interval No significant change was found Confirmed by Josanne Boerema  MD, Lennette Bihari ([id number]) on 11/21/2015 12:25:45 PM       Radiology Dg Chest 2 View  Result Date: 11/21/2015 CLINICAL DATA:  Nausea, weakness, dizziness, congestion, and productive cough with shortness of breath for 2-3 weeks. EXAM: CHEST  2 VIEW COMPARISON:  11/05/2015 FINDINGS: The patient is rotated to the right. The cardiomediastinal silhouette is grossly unchanged within this limitation. Bilateral airspace disease on the prior study has largely resolved in the interim. There is mild  residual ill-defined opacity in both lung bases. There is mild peribronchial thickening. No pleural effusion or pneumothorax is seen. No acute osseous  abnormality is identified. IMPRESSION: Interval clearing of bilateral airspace opacities. Mild residual opacity in both lung bases could reflect resolving infection. Electronically Signed   By: Logan Bores M.D.   On: 11/21/2015 13:29    Procedures Procedures (including critical care time)  Medications Ordered in ED Medications  sodium chloride 0.9 % bolus 500 mL (0 mLs Intravenous Stopped 11/21/15 1610)     Initial Impression / Assessment and Plan / ED Course  I have reviewed the triage vital signs and the nursing notes.  Pertinent labs & imaging results that were available during my care of the patient were reviewed by me and considered in my medical decision making (see chart for details).  Clinical Course    Patient feels much better.  She was able to ambulate to the restroom without any difficulty and without any assistance.  Feels better after small normal saline bolus.  Her labs and chest x-ray without significant abnormality.  I spoke with her daughter who will continue working with a home health agency with physical therapy at home to strengthen the patient.  Patient and daughter are agreeable to outpatient management.  They both understand to return to the ER for new or worsening symptoms  Final Clinical Impressions(s) / ED Diagnoses   Final diagnoses:  None    New Prescriptions New Prescriptions   No medications on file     Jola Schmidt, MD 11/21/15 1627

## 2015-11-21 NOTE — ED Notes (Signed)
PT DISCHARGED. INSTRUCTIONS GIVEN. AAOX4. PT IN NO APPARENT DISTRESS OR PAIN. THE OPPORTUNITY TO ASK QUESTIONS WAS PROVIDED. 

## 2015-11-21 NOTE — ED Notes (Signed)
THE DAUGHTER, DOREEN DAWKINS MADE AWARE OF PT'S DISCHARGE HOME. THE DAUGHTER STATES HER SISTER IS ON HER WAY TO PICK UP THE PT.

## 2015-11-22 DIAGNOSIS — B3781 Candidal esophagitis: Secondary | ICD-10-CM | POA: Diagnosis not present

## 2015-11-22 DIAGNOSIS — R1310 Dysphagia, unspecified: Secondary | ICD-10-CM | POA: Diagnosis not present

## 2015-11-22 DIAGNOSIS — E1142 Type 2 diabetes mellitus with diabetic polyneuropathy: Secondary | ICD-10-CM | POA: Diagnosis not present

## 2015-11-22 DIAGNOSIS — F209 Schizophrenia, unspecified: Secondary | ICD-10-CM | POA: Diagnosis not present

## 2015-11-22 DIAGNOSIS — K922 Gastrointestinal hemorrhage, unspecified: Secondary | ICD-10-CM | POA: Diagnosis not present

## 2015-11-22 DIAGNOSIS — F0390 Unspecified dementia without behavioral disturbance: Secondary | ICD-10-CM | POA: Diagnosis not present

## 2015-11-23 DIAGNOSIS — E1142 Type 2 diabetes mellitus with diabetic polyneuropathy: Secondary | ICD-10-CM | POA: Diagnosis not present

## 2015-11-23 DIAGNOSIS — R1310 Dysphagia, unspecified: Secondary | ICD-10-CM | POA: Diagnosis not present

## 2015-11-23 DIAGNOSIS — F0390 Unspecified dementia without behavioral disturbance: Secondary | ICD-10-CM | POA: Diagnosis not present

## 2015-11-23 DIAGNOSIS — F209 Schizophrenia, unspecified: Secondary | ICD-10-CM | POA: Diagnosis not present

## 2015-11-23 DIAGNOSIS — B3781 Candidal esophagitis: Secondary | ICD-10-CM | POA: Diagnosis not present

## 2015-11-23 DIAGNOSIS — K922 Gastrointestinal hemorrhage, unspecified: Secondary | ICD-10-CM | POA: Diagnosis not present

## 2015-11-27 DIAGNOSIS — F209 Schizophrenia, unspecified: Secondary | ICD-10-CM | POA: Diagnosis not present

## 2015-11-27 DIAGNOSIS — K922 Gastrointestinal hemorrhage, unspecified: Secondary | ICD-10-CM | POA: Diagnosis not present

## 2015-11-27 DIAGNOSIS — B3781 Candidal esophagitis: Secondary | ICD-10-CM | POA: Diagnosis not present

## 2015-11-27 DIAGNOSIS — E1142 Type 2 diabetes mellitus with diabetic polyneuropathy: Secondary | ICD-10-CM | POA: Diagnosis not present

## 2015-11-27 DIAGNOSIS — F0390 Unspecified dementia without behavioral disturbance: Secondary | ICD-10-CM | POA: Diagnosis not present

## 2015-11-27 DIAGNOSIS — R1310 Dysphagia, unspecified: Secondary | ICD-10-CM | POA: Diagnosis not present

## 2015-11-28 DIAGNOSIS — K209 Esophagitis, unspecified: Secondary | ICD-10-CM | POA: Diagnosis not present

## 2015-11-28 DIAGNOSIS — D649 Anemia, unspecified: Secondary | ICD-10-CM | POA: Diagnosis not present

## 2015-11-28 DIAGNOSIS — E1165 Type 2 diabetes mellitus with hyperglycemia: Secondary | ICD-10-CM | POA: Diagnosis not present

## 2015-11-28 DIAGNOSIS — Z794 Long term (current) use of insulin: Secondary | ICD-10-CM | POA: Diagnosis not present

## 2015-11-29 DIAGNOSIS — F209 Schizophrenia, unspecified: Secondary | ICD-10-CM | POA: Diagnosis not present

## 2015-11-29 DIAGNOSIS — E1142 Type 2 diabetes mellitus with diabetic polyneuropathy: Secondary | ICD-10-CM | POA: Diagnosis not present

## 2015-11-29 DIAGNOSIS — K922 Gastrointestinal hemorrhage, unspecified: Secondary | ICD-10-CM | POA: Diagnosis not present

## 2015-11-29 DIAGNOSIS — B3781 Candidal esophagitis: Secondary | ICD-10-CM | POA: Diagnosis not present

## 2015-11-29 DIAGNOSIS — R1310 Dysphagia, unspecified: Secondary | ICD-10-CM | POA: Diagnosis not present

## 2015-11-29 DIAGNOSIS — F0390 Unspecified dementia without behavioral disturbance: Secondary | ICD-10-CM | POA: Diagnosis not present

## 2015-12-01 DIAGNOSIS — E1142 Type 2 diabetes mellitus with diabetic polyneuropathy: Secondary | ICD-10-CM | POA: Diagnosis not present

## 2015-12-01 DIAGNOSIS — F209 Schizophrenia, unspecified: Secondary | ICD-10-CM | POA: Diagnosis not present

## 2015-12-01 DIAGNOSIS — F0390 Unspecified dementia without behavioral disturbance: Secondary | ICD-10-CM | POA: Diagnosis not present

## 2015-12-01 DIAGNOSIS — K922 Gastrointestinal hemorrhage, unspecified: Secondary | ICD-10-CM | POA: Diagnosis not present

## 2015-12-01 DIAGNOSIS — B3781 Candidal esophagitis: Secondary | ICD-10-CM | POA: Diagnosis not present

## 2015-12-01 DIAGNOSIS — R1310 Dysphagia, unspecified: Secondary | ICD-10-CM | POA: Diagnosis not present

## 2015-12-04 DIAGNOSIS — E1142 Type 2 diabetes mellitus with diabetic polyneuropathy: Secondary | ICD-10-CM | POA: Diagnosis not present

## 2015-12-04 DIAGNOSIS — R1310 Dysphagia, unspecified: Secondary | ICD-10-CM | POA: Diagnosis not present

## 2015-12-04 DIAGNOSIS — F209 Schizophrenia, unspecified: Secondary | ICD-10-CM | POA: Diagnosis not present

## 2015-12-04 DIAGNOSIS — B3781 Candidal esophagitis: Secondary | ICD-10-CM | POA: Diagnosis not present

## 2015-12-04 DIAGNOSIS — K922 Gastrointestinal hemorrhage, unspecified: Secondary | ICD-10-CM | POA: Diagnosis not present

## 2015-12-04 DIAGNOSIS — F0390 Unspecified dementia without behavioral disturbance: Secondary | ICD-10-CM | POA: Diagnosis not present

## 2015-12-06 DIAGNOSIS — E1142 Type 2 diabetes mellitus with diabetic polyneuropathy: Secondary | ICD-10-CM | POA: Diagnosis not present

## 2015-12-06 DIAGNOSIS — R1310 Dysphagia, unspecified: Secondary | ICD-10-CM | POA: Diagnosis not present

## 2015-12-06 DIAGNOSIS — K922 Gastrointestinal hemorrhage, unspecified: Secondary | ICD-10-CM | POA: Diagnosis not present

## 2015-12-06 DIAGNOSIS — B3781 Candidal esophagitis: Secondary | ICD-10-CM | POA: Diagnosis not present

## 2015-12-06 DIAGNOSIS — F209 Schizophrenia, unspecified: Secondary | ICD-10-CM | POA: Diagnosis not present

## 2015-12-06 DIAGNOSIS — F0390 Unspecified dementia without behavioral disturbance: Secondary | ICD-10-CM | POA: Diagnosis not present

## 2015-12-07 DIAGNOSIS — F0390 Unspecified dementia without behavioral disturbance: Secondary | ICD-10-CM | POA: Diagnosis not present

## 2015-12-07 DIAGNOSIS — K922 Gastrointestinal hemorrhage, unspecified: Secondary | ICD-10-CM | POA: Diagnosis not present

## 2015-12-07 DIAGNOSIS — E1142 Type 2 diabetes mellitus with diabetic polyneuropathy: Secondary | ICD-10-CM | POA: Diagnosis not present

## 2015-12-07 DIAGNOSIS — F209 Schizophrenia, unspecified: Secondary | ICD-10-CM | POA: Diagnosis not present

## 2015-12-07 DIAGNOSIS — R1310 Dysphagia, unspecified: Secondary | ICD-10-CM | POA: Diagnosis not present

## 2015-12-07 DIAGNOSIS — B3781 Candidal esophagitis: Secondary | ICD-10-CM | POA: Diagnosis not present

## 2015-12-12 DIAGNOSIS — K922 Gastrointestinal hemorrhage, unspecified: Secondary | ICD-10-CM | POA: Diagnosis not present

## 2015-12-12 DIAGNOSIS — E1142 Type 2 diabetes mellitus with diabetic polyneuropathy: Secondary | ICD-10-CM | POA: Diagnosis not present

## 2015-12-12 DIAGNOSIS — F209 Schizophrenia, unspecified: Secondary | ICD-10-CM | POA: Diagnosis not present

## 2015-12-12 DIAGNOSIS — R1310 Dysphagia, unspecified: Secondary | ICD-10-CM | POA: Diagnosis not present

## 2015-12-12 DIAGNOSIS — B3781 Candidal esophagitis: Secondary | ICD-10-CM | POA: Diagnosis not present

## 2015-12-12 DIAGNOSIS — F0390 Unspecified dementia without behavioral disturbance: Secondary | ICD-10-CM | POA: Diagnosis not present

## 2015-12-15 DIAGNOSIS — K922 Gastrointestinal hemorrhage, unspecified: Secondary | ICD-10-CM | POA: Diagnosis not present

## 2015-12-15 DIAGNOSIS — F0390 Unspecified dementia without behavioral disturbance: Secondary | ICD-10-CM | POA: Diagnosis not present

## 2015-12-15 DIAGNOSIS — E1142 Type 2 diabetes mellitus with diabetic polyneuropathy: Secondary | ICD-10-CM | POA: Diagnosis not present

## 2015-12-15 DIAGNOSIS — F209 Schizophrenia, unspecified: Secondary | ICD-10-CM | POA: Diagnosis not present

## 2015-12-15 DIAGNOSIS — B3781 Candidal esophagitis: Secondary | ICD-10-CM | POA: Diagnosis not present

## 2015-12-15 DIAGNOSIS — R1310 Dysphagia, unspecified: Secondary | ICD-10-CM | POA: Diagnosis not present

## 2015-12-19 DIAGNOSIS — E1142 Type 2 diabetes mellitus with diabetic polyneuropathy: Secondary | ICD-10-CM | POA: Diagnosis not present

## 2015-12-19 DIAGNOSIS — F0390 Unspecified dementia without behavioral disturbance: Secondary | ICD-10-CM | POA: Diagnosis not present

## 2015-12-19 DIAGNOSIS — B3781 Candidal esophagitis: Secondary | ICD-10-CM | POA: Diagnosis not present

## 2015-12-19 DIAGNOSIS — K922 Gastrointestinal hemorrhage, unspecified: Secondary | ICD-10-CM | POA: Diagnosis not present

## 2015-12-19 DIAGNOSIS — R1310 Dysphagia, unspecified: Secondary | ICD-10-CM | POA: Diagnosis not present

## 2015-12-19 DIAGNOSIS — F209 Schizophrenia, unspecified: Secondary | ICD-10-CM | POA: Diagnosis not present

## 2015-12-21 DIAGNOSIS — B3781 Candidal esophagitis: Secondary | ICD-10-CM | POA: Diagnosis not present

## 2015-12-21 DIAGNOSIS — E1142 Type 2 diabetes mellitus with diabetic polyneuropathy: Secondary | ICD-10-CM | POA: Diagnosis not present

## 2015-12-21 DIAGNOSIS — F0390 Unspecified dementia without behavioral disturbance: Secondary | ICD-10-CM | POA: Diagnosis not present

## 2015-12-21 DIAGNOSIS — F209 Schizophrenia, unspecified: Secondary | ICD-10-CM | POA: Diagnosis not present

## 2015-12-21 DIAGNOSIS — R1310 Dysphagia, unspecified: Secondary | ICD-10-CM | POA: Diagnosis not present

## 2015-12-21 DIAGNOSIS — K922 Gastrointestinal hemorrhage, unspecified: Secondary | ICD-10-CM | POA: Diagnosis not present

## 2015-12-22 DIAGNOSIS — F0390 Unspecified dementia without behavioral disturbance: Secondary | ICD-10-CM | POA: Diagnosis not present

## 2015-12-22 DIAGNOSIS — E1142 Type 2 diabetes mellitus with diabetic polyneuropathy: Secondary | ICD-10-CM | POA: Diagnosis not present

## 2015-12-22 DIAGNOSIS — B3781 Candidal esophagitis: Secondary | ICD-10-CM | POA: Diagnosis not present

## 2015-12-22 DIAGNOSIS — F209 Schizophrenia, unspecified: Secondary | ICD-10-CM | POA: Diagnosis not present

## 2015-12-22 DIAGNOSIS — R1310 Dysphagia, unspecified: Secondary | ICD-10-CM | POA: Diagnosis not present

## 2015-12-22 DIAGNOSIS — K922 Gastrointestinal hemorrhage, unspecified: Secondary | ICD-10-CM | POA: Diagnosis not present

## 2015-12-26 DIAGNOSIS — Z794 Long term (current) use of insulin: Secondary | ICD-10-CM | POA: Diagnosis not present

## 2015-12-26 DIAGNOSIS — Z0001 Encounter for general adult medical examination with abnormal findings: Secondary | ICD-10-CM | POA: Diagnosis not present

## 2015-12-26 DIAGNOSIS — M545 Low back pain: Secondary | ICD-10-CM | POA: Diagnosis not present

## 2015-12-26 DIAGNOSIS — Z79899 Other long term (current) drug therapy: Secondary | ICD-10-CM | POA: Diagnosis not present

## 2015-12-26 DIAGNOSIS — D649 Anemia, unspecified: Secondary | ICD-10-CM | POA: Diagnosis not present

## 2015-12-26 DIAGNOSIS — F0390 Unspecified dementia without behavioral disturbance: Secondary | ICD-10-CM | POA: Diagnosis not present

## 2015-12-26 DIAGNOSIS — F209 Schizophrenia, unspecified: Secondary | ICD-10-CM | POA: Diagnosis not present

## 2015-12-26 DIAGNOSIS — I10 Essential (primary) hypertension: Secondary | ICD-10-CM | POA: Diagnosis not present

## 2015-12-26 DIAGNOSIS — E1165 Type 2 diabetes mellitus with hyperglycemia: Secondary | ICD-10-CM | POA: Diagnosis not present

## 2015-12-27 DIAGNOSIS — F209 Schizophrenia, unspecified: Secondary | ICD-10-CM | POA: Diagnosis not present

## 2015-12-27 DIAGNOSIS — E1142 Type 2 diabetes mellitus with diabetic polyneuropathy: Secondary | ICD-10-CM | POA: Diagnosis not present

## 2015-12-27 DIAGNOSIS — R1310 Dysphagia, unspecified: Secondary | ICD-10-CM | POA: Diagnosis not present

## 2015-12-27 DIAGNOSIS — B3781 Candidal esophagitis: Secondary | ICD-10-CM | POA: Diagnosis not present

## 2015-12-27 DIAGNOSIS — K922 Gastrointestinal hemorrhage, unspecified: Secondary | ICD-10-CM | POA: Diagnosis not present

## 2015-12-27 DIAGNOSIS — F0390 Unspecified dementia without behavioral disturbance: Secondary | ICD-10-CM | POA: Diagnosis not present

## 2015-12-29 DIAGNOSIS — F209 Schizophrenia, unspecified: Secondary | ICD-10-CM | POA: Diagnosis not present

## 2015-12-29 DIAGNOSIS — R1310 Dysphagia, unspecified: Secondary | ICD-10-CM | POA: Diagnosis not present

## 2015-12-29 DIAGNOSIS — E1142 Type 2 diabetes mellitus with diabetic polyneuropathy: Secondary | ICD-10-CM | POA: Diagnosis not present

## 2015-12-29 DIAGNOSIS — F0390 Unspecified dementia without behavioral disturbance: Secondary | ICD-10-CM | POA: Diagnosis not present

## 2015-12-29 DIAGNOSIS — B3781 Candidal esophagitis: Secondary | ICD-10-CM | POA: Diagnosis not present

## 2015-12-29 DIAGNOSIS — K922 Gastrointestinal hemorrhage, unspecified: Secondary | ICD-10-CM | POA: Diagnosis not present

## 2016-01-09 DIAGNOSIS — F209 Schizophrenia, unspecified: Secondary | ICD-10-CM | POA: Diagnosis not present

## 2016-01-09 DIAGNOSIS — K922 Gastrointestinal hemorrhage, unspecified: Secondary | ICD-10-CM | POA: Diagnosis not present

## 2016-01-09 DIAGNOSIS — R1310 Dysphagia, unspecified: Secondary | ICD-10-CM | POA: Diagnosis not present

## 2016-01-09 DIAGNOSIS — F0390 Unspecified dementia without behavioral disturbance: Secondary | ICD-10-CM | POA: Diagnosis not present

## 2016-01-09 DIAGNOSIS — B3781 Candidal esophagitis: Secondary | ICD-10-CM | POA: Diagnosis not present

## 2016-01-09 DIAGNOSIS — E1142 Type 2 diabetes mellitus with diabetic polyneuropathy: Secondary | ICD-10-CM | POA: Diagnosis not present

## 2016-01-12 DIAGNOSIS — B3781 Candidal esophagitis: Secondary | ICD-10-CM | POA: Diagnosis not present

## 2016-01-12 DIAGNOSIS — R1310 Dysphagia, unspecified: Secondary | ICD-10-CM | POA: Diagnosis not present

## 2016-01-12 DIAGNOSIS — E1142 Type 2 diabetes mellitus with diabetic polyneuropathy: Secondary | ICD-10-CM | POA: Diagnosis not present

## 2016-01-12 DIAGNOSIS — K922 Gastrointestinal hemorrhage, unspecified: Secondary | ICD-10-CM | POA: Diagnosis not present

## 2016-01-12 DIAGNOSIS — F0390 Unspecified dementia without behavioral disturbance: Secondary | ICD-10-CM | POA: Diagnosis not present

## 2016-01-12 DIAGNOSIS — F209 Schizophrenia, unspecified: Secondary | ICD-10-CM | POA: Diagnosis not present

## 2016-01-16 DIAGNOSIS — F209 Schizophrenia, unspecified: Secondary | ICD-10-CM | POA: Diagnosis not present

## 2016-01-16 DIAGNOSIS — Z79899 Other long term (current) drug therapy: Secondary | ICD-10-CM | POA: Diagnosis not present

## 2016-01-22 ENCOUNTER — Emergency Department (HOSPITAL_COMMUNITY): Payer: Medicare Other

## 2016-01-22 ENCOUNTER — Encounter (HOSPITAL_COMMUNITY): Payer: Self-pay | Admitting: Emergency Medicine

## 2016-01-22 ENCOUNTER — Emergency Department (HOSPITAL_COMMUNITY)
Admission: EM | Admit: 2016-01-22 | Discharge: 2016-01-22 | Disposition: A | Discharge status: Home / Self Care | Payer: Medicare Other | Attending: Emergency Medicine | Admitting: Emergency Medicine

## 2016-01-22 DIAGNOSIS — Z794 Long term (current) use of insulin: Secondary | ICD-10-CM | POA: Diagnosis not present

## 2016-01-22 DIAGNOSIS — Z87891 Personal history of nicotine dependence: Secondary | ICD-10-CM | POA: Insufficient documentation

## 2016-01-22 DIAGNOSIS — M545 Low back pain, unspecified: Secondary | ICD-10-CM

## 2016-01-22 DIAGNOSIS — I2510 Atherosclerotic heart disease of native coronary artery without angina pectoris: Secondary | ICD-10-CM | POA: Diagnosis not present

## 2016-01-22 DIAGNOSIS — E1140 Type 2 diabetes mellitus with diabetic neuropathy, unspecified: Secondary | ICD-10-CM | POA: Diagnosis not present

## 2016-01-22 DIAGNOSIS — I10 Essential (primary) hypertension: Secondary | ICD-10-CM | POA: Diagnosis not present

## 2016-01-22 DIAGNOSIS — Z96651 Presence of right artificial knee joint: Secondary | ICD-10-CM | POA: Diagnosis not present

## 2016-01-22 DIAGNOSIS — Z853 Personal history of malignant neoplasm of breast: Secondary | ICD-10-CM | POA: Insufficient documentation

## 2016-01-22 DIAGNOSIS — Z955 Presence of coronary angioplasty implant and graft: Secondary | ICD-10-CM | POA: Insufficient documentation

## 2016-01-22 DIAGNOSIS — R109 Unspecified abdominal pain: Secondary | ICD-10-CM | POA: Diagnosis not present

## 2016-01-22 MED ORDER — OXYCODONE-ACETAMINOPHEN 5-325 MG PO TABS
1.0000 | ORAL_TABLET | Freq: Once | ORAL | Status: AC
  Administered 2016-01-22: 1 via ORAL
  Filled 2016-01-22: qty 1

## 2016-01-22 MED ORDER — HYDROCODONE-ACETAMINOPHEN 5-325 MG PO TABS: 1.0000 | ORAL_TABLET | Freq: Four times a day (QID) | ORAL | 0 refills | Status: DC | PRN

## 2016-01-22 NOTE — ED Triage Notes (Signed)
Per EMS, Pt from home c/o nontraumatic lumbar and bilateral flank pain, woke up Saturday morning with the pain. Denies numbness and tingling. Tender to palpation, no deformity noted. Denies urinary symptoms. A&Ox4, ambulatory with cane.

## 2016-01-22 NOTE — Discharge Instructions (Signed)
It was our pleasure to provide your ER care today - we hope that you feel better.  Rest. Avoid heavy lifting > 10 lbs, or bending at waist for the next week.  Take motrin or aleve as need for pain. You may also take hydrocodone as need for pain. No driving for the next 6 hours or when taking hydrocodone. Also, do not take tylenol or acetaminophen containing medication when taking hydrocodone.  Follow up with primary care doctor in the coming week  - also have your blood pressure rechecked then as it is high today.   Return to ER if worse, new symptoms, fevers, intractable pain, numbness/weakness, other concern.

## 2016-01-22 NOTE — ED Provider Notes (Signed)
[facility] DEPT Provider Note   CSN: [id number] Arrival date & time: 01/22/16  1206     History   Chief Complaint Chief Complaint  Patient presents with  . Back Pain  . Flank Pain    HPI [name] is a 75 y.o. female.  Patient c/o low back pain for the past few days. Pain constant, dull, mod-severe, worse w position changes, turning, or bending. Denies fall or injury. Denies hx chronic back pain. Patient denies radicular pain. No numbness/wekaness. No trouble with urine retention or incontinence. No dysuria or hematuria. Denies fever or chills. Indicates had not yet taken any medication for the pain.       Back Pain   Pertinent negatives include no chest pain, no fever, no numbness, no headaches, no abdominal pain, no dysuria and no weakness.  Flank Pain  Pertinent negatives include no chest pain, no abdominal pain, no headaches and no shortness of breath.    Past Medical History:  Diagnosis Date  . Allergic rhinitis   . Anemia, iron deficiency   . Anxiety   . Arthritis    back  . CAD (coronary artery disease)    CAD stent placed 2005  . Cancer ([place])    Left breast invasive ductal carcinoma  . Dementia   . Esophageal motility disorder   . GERD (gastroesophageal reflux disease)   . HTN (hypertension)   . Hyperlipidemia   . IBS (irritable bowel syndrome)   . Peripheral neuropathy ([place])   . Pneumonia 12-09-2014   adm with CAP requiring mechanical ventilation x 10d  . PVD (peripheral vascular disease) (HCC)    Left subclavian 70% stenosis  . Renal insufficiency   . 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 Syncope    EP study 1996 w/atrial tachycardia  . Type II or unspecified type diabetes mellitus without mention of complication, not stated as uncontrolled     Patient Active Problem List   Diagnosis Date Noted  . Septic shock ([place]) 11/02/2015  . Acute blood loss anemia 11/02/2015  . Acute kidney injury ([place]) 11/02/2015  . Upper GI bleed  11/01/2015  . GI bleed 11/01/2015  . Breast cancer of upper-outer quadrant of left female breast ([place]) 08/29/2015  . Hypernatremia 12/23/2014  . Hypokalemia 12/23/2014  . Sepsis ([place]) 12/23/2014  . Anemia 12/23/2014  . Acute respiratory failure with hypoxemia ([place])   . Acute respiratory failure with hypoxia ([place]) 12/10/2014  . ARDS (adult respiratory distress syndrome) ([place]) 12/10/2014  . Acute encephalopathy   . Blood poisoning ([place])   . CAP (community acquired pneumonia) 12/09/2014  . DYSPHAGIA, PHARYNGOESOPHAGEAL PHASE 02/07/2009  . RENAL INSUFFICIENCY 02/05/2009  . TOBACCO USE, QUIT 02/03/2009  . VERTIGO 06/22/2008  . BACK PAIN 10/26/2007  . HYPERLIPIDEMIA 06/02/2007  . Dementia 06/02/2007  . Schizophrenia, unspecified type ([place]) 06/02/2007  . PERIPHERAL VASCULAR DISEASE 06/02/2007  . ALLERGIC RHINITIS 06/02/2007  . IBS 06/02/2007  . FATIGUE 06/02/2007  . PERIPHERAL EDEMA 06/02/2007  . Diabetes mellitus, type 2 ([place]) 09/04/2006  . ANEMIA-IRON DEFICIENCY 09/04/2006  . ANXIETY 09/04/2006  . PERIPHERAL NEUROPATHY 09/04/2006  . Essential hypertension 09/04/2006  . CORONARY ARTERY DISEASE 09/04/2006  . GERD 09/04/2006  . HX, PERSONAL, SCHIZOPHRENIA 09/04/2006    Past Surgical History:  Procedure Laterality Date  . ABDOMINAL HYSTERECTOMY    . BREAST BIOPSY     Negative  . ESOPHAGOGASTRODUODENOSCOPY N/A 11/02/2015   Procedure: ESOPHAGOGASTRODUODENOSCOPY (EGD);  Surgeon: Laurence Spates, MD;  Location: [facility];  Service:  Endoscopy;  Laterality: N/A;  . JOINT REPLACEMENT Bilateral   . KNEE ARTHROPLASTY     Right   . PTCA  1995  . RADIOACTIVE SEED GUIDED MASTECTOMY WITH AXILLARY SENTINEL LYMPH NODE BIOPSY Left 08/17/2015   Procedure: RADIOACTIVE SEED GUIDED LUMPECTOMY WITH AXILLARY SENTINEL LYMPH NODE BIOPSY;  Surgeon: Donnie Mesa, MD;  Location: [facility];  Service: General;  Laterality: Left;  . RE-EXCISION OF BREAST LUMPECTOMY Left 09/07/2015    Procedure: RE-EXCISION OF LEFT BREAST LUMPECTOMY;  Surgeon: Donnie Mesa, MD;  Location: [facility];  Service: General;  Laterality: Left;    OB History    No data available       Home Medications    Prior to Admission medications   Medication Sig Start Date End Date Taking? Authorizing Provider  acetaminophen (TYLENOL) 500 MG tablet Take 500-1,000 mg by mouth 3 (three) times daily as needed. For back pain    Yes Historical Provider, MD  amLODipine (NORVASC) 5 MG tablet Take 5 mg by mouth daily.   Yes Historical Provider, MD  atorvastatin (LIPITOR) 40 MG tablet Take 40 mg by mouth daily.   Yes Historical Provider, MD  cloZAPine (CLOZARIL) 100 MG tablet Take 1 tablet (100 mg total) by mouth at bedtime. Patient taking differently: Take 150 mg by mouth at bedtime.  12/27/14  Yes Maryellen Pile, MD  donepezil (ARICEPT) 10 MG tablet Take 10 mg by mouth at bedtime.   Yes Historical Provider, MD  feeding supplement, GLUCERNA SHAKE, (GLUCERNA SHAKE) LIQD Take 237 mLs by mouth 3 (three) times daily between meals. Patient taking differently: Take 237 mLs by mouth daily as needed (poor appetite).  11/09/15  Yes Clanford Marisa Hua, MD  furosemide (LASIX) 40 MG tablet Take 20 mg by mouth daily.    Yes Historical Provider, MD  haloperidol (HALDOL) 5 MG tablet Take 5-10 mg by mouth See admin instructions. Take 1 in the morning and 2 at night    Yes Historical Provider, MD  insulin glargine (LANTUS) 100 UNIT/ML injection Inject 15 Units into the skin at bedtime.   Yes Historical Provider, MD  levocetirizine (XYZAL) 5 MG tablet Take 5 mg by mouth every evening.   Yes Historical Provider, MD  memantine (NAMENDA) 10 MG tablet Take 1 tablet (10 mg total) by mouth 2 (two) times daily. 10/09/12  Yes Neena Rhymes, MD  mirtazapine (REMERON) 30 MG tablet Take 30 mg by mouth at bedtime.   Yes Historical Provider, MD  omeprazole (PRILOSEC) 40 MG capsule Take 40 mg by mouth daily.   Yes Historical  Provider, MD  pantoprazole (PROTONIX) 40 MG tablet Take 40 mg by mouth daily.   Yes Historical Provider, MD  potassium chloride (KLOR-CON) 8 MEQ tablet Take 1 tablet (8 mEq total) by mouth daily. 04/28/12  Yes Neena Rhymes, MD  ramipril (ALTACE) 10 MG capsule Take 10 mg by mouth daily.   Yes Historical Provider, MD  saccharomyces boulardii (FLORASTOR) 250 MG capsule Take 1 capsule (250 mg total) by mouth 2 (two) times daily. 11/09/15  Yes Clanford Marisa Hua, MD  traZODone (DESYREL) 100 MG tablet Take 100 mg by mouth at bedtime as needed for sleep (insomnia).   Yes Historical Provider, MD  valsartan (DIOVAN) 160 MG tablet Take 160 mg by mouth daily.   Yes Historical Provider, MD    Family History Family History  Problem Relation Age of Onset  . Hypertension Other   . Diabetes Other   . Schizophrenia  Neg Hx   . Coronary artery disease Neg Hx   . Colon cancer Neg Hx   . Breast cancer Neg Hx     Social History Social History  Substance Use Topics  . Smoking status: Former Research scientist (life sciences)  . Smokeless tobacco: Never Used  . Alcohol use No     Allergies   Codeine phosphate; Glimepiride; Glyburide; Levofloxacin; Shellfish allergy; and Tramadol   Review of Systems Review of Systems  Constitutional: Negative for fever.  HENT: Negative for sore throat.   Eyes: Negative for visual disturbance.  Respiratory: Negative for cough and shortness of breath.   Cardiovascular: Negative for chest pain.  Gastrointestinal: Negative for abdominal pain, diarrhea and vomiting.  Genitourinary: Negative for dysuria and hematuria.  Musculoskeletal: Positive for back pain. Negative for neck pain.  Skin: Negative for rash.  Neurological: Negative for weakness, numbness and headaches.  Hematological: Does not bruise/bleed easily.  Psychiatric/Behavioral: Negative for confusion.     Physical Exam Updated Vital Signs BP 183/72 (BP Location: Left Arm)   Pulse 100   Temp 98.2 F (36.8 C) (Oral)   Resp 18    SpO2 97%   Physical Exam  Constitutional: She appears well-developed and well-nourished. No distress.  HENT:  Mouth/Throat: Oropharynx is clear and moist.  Eyes: Conjunctivae are normal. No scleral icterus.  Neck: Neck supple. No tracheal deviation present.  Cardiovascular: Normal rate, regular rhythm, normal heart sounds and intact distal pulses.   Pulmonary/Chest: Effort normal and breath sounds normal. No respiratory distress.  Abdominal: Soft. Normal appearance and bowel sounds are normal. She exhibits no distension and no mass. There is no tenderness. There is no rebound and no guarding. No hernia.  No pulsatile mass  Genitourinary:  Genitourinary Comments: No cva tenderness  Musculoskeletal: She exhibits no edema.  Neurological: She is alert.  Skin: Skin is warm and dry. No rash noted. She is not diaphoretic.  No shingles/rash in area of pain  Psychiatric: She has a normal mood and affect.  Nursing note and vitals reviewed.    ED Treatments / Results  Labs (all labs ordered are listed, but only abnormal results are displayed) Labs Reviewed - No data to display  EKG  EKG Interpretation None       Radiology Dg Lumbar Spine Complete  Result Date: 01/22/2016 CLINICAL DATA:  Pain lumbar region for 3 days. No known injury. Initial encounter. EXAM: LUMBAR SPINE - COMPLETE 4+ VIEW COMPARISON:  11/01/2015 abdominal film.  01/02/2012 lumbar spine MR. FINDINGS: Moderate scoliosis lumbar spine convex to left with significant disc space narrowing and osteophyte formation upper lumbar region most notable on the right at the L2-3 level. Findings have progressed since the prior MR. No pars defect detected. Vascular calcifications. IMPRESSION: Progressive degenerative changes throughout the lumbar spine with disc space narrowing and osteophyte formation most notable on the right at the L2-3 level. Aortic atherosclerosis. Electronically Signed   By: Genia Del M.D.   On: 01/22/2016  13:50    Procedures Procedures (including critical care time)  Medications Ordered in ED Medications  oxyCODONE-acetaminophen (PERCOCET/ROXICET) 5-325 MG per tablet 1 tablet (not administered)     Initial Impression / Assessment and Plan / ED Course  I have reviewed the triage vital signs and the nursing notes.  Pertinent labs & imaging results that were available during my care of the patient were reviewed by me and considered in my medical decision making (see chart for details).  Clinical Course    xrays ordered.  Percocet 1 po.  Reviewed nursing notes and prior charts for additional history.   Patients pain consistently reproduced with turning on bed, sitting up, positional changes, and appears musculoskeletal in etiology.  Patient currently appears stable for d/c.     Final Clinical Impressions(s) / ED Diagnoses   Final diagnoses:  None    New Prescriptions New Prescriptions   No medications on file     Lajean Saver, MD 01/22/16 1412

## 2016-01-22 NOTE — ED Notes (Signed)
Pt refusing to get in gown at this time due to pain.

## 2016-02-09 DIAGNOSIS — M545 Low back pain: Secondary | ICD-10-CM | POA: Diagnosis not present

## 2016-02-09 DIAGNOSIS — M5136 Other intervertebral disc degeneration, lumbar region: Secondary | ICD-10-CM | POA: Diagnosis not present

## 2016-02-09 DIAGNOSIS — I10 Essential (primary) hypertension: Secondary | ICD-10-CM | POA: Diagnosis not present

## 2016-03-01 DIAGNOSIS — F251 Schizoaffective disorder, depressive type: Secondary | ICD-10-CM | POA: Diagnosis not present

## 2016-03-01 DIAGNOSIS — F250 Schizoaffective disorder, bipolar type: Secondary | ICD-10-CM | POA: Diagnosis not present

## 2016-03-01 DIAGNOSIS — F209 Schizophrenia, unspecified: Secondary | ICD-10-CM | POA: Diagnosis not present

## 2016-03-01 DIAGNOSIS — Z79899 Other long term (current) drug therapy: Secondary | ICD-10-CM | POA: Diagnosis not present

## 2016-03-05 DIAGNOSIS — D649 Anemia, unspecified: Secondary | ICD-10-CM | POA: Diagnosis not present

## 2016-03-05 DIAGNOSIS — Z794 Long term (current) use of insulin: Secondary | ICD-10-CM | POA: Diagnosis not present

## 2016-03-05 DIAGNOSIS — F0390 Unspecified dementia without behavioral disturbance: Secondary | ICD-10-CM | POA: Diagnosis not present

## 2016-03-05 DIAGNOSIS — Z87891 Personal history of nicotine dependence: Secondary | ICD-10-CM | POA: Diagnosis not present

## 2016-03-05 DIAGNOSIS — M1990 Unspecified osteoarthritis, unspecified site: Secondary | ICD-10-CM | POA: Diagnosis not present

## 2016-03-05 DIAGNOSIS — E1142 Type 2 diabetes mellitus with diabetic polyneuropathy: Secondary | ICD-10-CM | POA: Diagnosis not present

## 2016-03-05 DIAGNOSIS — Z96653 Presence of artificial knee joint, bilateral: Secondary | ICD-10-CM | POA: Diagnosis not present

## 2016-03-05 DIAGNOSIS — M5136 Other intervertebral disc degeneration, lumbar region: Secondary | ICD-10-CM | POA: Diagnosis not present

## 2016-03-05 DIAGNOSIS — F200 Paranoid schizophrenia: Secondary | ICD-10-CM | POA: Diagnosis not present

## 2016-03-05 DIAGNOSIS — Z955 Presence of coronary angioplasty implant and graft: Secondary | ICD-10-CM | POA: Diagnosis not present

## 2016-03-05 DIAGNOSIS — I10 Essential (primary) hypertension: Secondary | ICD-10-CM | POA: Diagnosis not present

## 2016-03-08 DIAGNOSIS — F0390 Unspecified dementia without behavioral disturbance: Secondary | ICD-10-CM | POA: Diagnosis not present

## 2016-03-08 DIAGNOSIS — E1142 Type 2 diabetes mellitus with diabetic polyneuropathy: Secondary | ICD-10-CM | POA: Diagnosis not present

## 2016-03-08 DIAGNOSIS — M1990 Unspecified osteoarthritis, unspecified site: Secondary | ICD-10-CM | POA: Diagnosis not present

## 2016-03-08 DIAGNOSIS — I10 Essential (primary) hypertension: Secondary | ICD-10-CM | POA: Diagnosis not present

## 2016-03-08 DIAGNOSIS — F200 Paranoid schizophrenia: Secondary | ICD-10-CM | POA: Diagnosis not present

## 2016-03-08 DIAGNOSIS — M5136 Other intervertebral disc degeneration, lumbar region: Secondary | ICD-10-CM | POA: Diagnosis not present

## 2016-03-12 DIAGNOSIS — M5136 Other intervertebral disc degeneration, lumbar region: Secondary | ICD-10-CM | POA: Diagnosis not present

## 2016-03-12 DIAGNOSIS — F0390 Unspecified dementia without behavioral disturbance: Secondary | ICD-10-CM | POA: Diagnosis not present

## 2016-03-12 DIAGNOSIS — I10 Essential (primary) hypertension: Secondary | ICD-10-CM | POA: Diagnosis not present

## 2016-03-12 DIAGNOSIS — M1990 Unspecified osteoarthritis, unspecified site: Secondary | ICD-10-CM | POA: Diagnosis not present

## 2016-03-12 DIAGNOSIS — E1142 Type 2 diabetes mellitus with diabetic polyneuropathy: Secondary | ICD-10-CM | POA: Diagnosis not present

## 2016-03-12 DIAGNOSIS — F200 Paranoid schizophrenia: Secondary | ICD-10-CM | POA: Diagnosis not present

## 2016-03-15 DIAGNOSIS — F0390 Unspecified dementia without behavioral disturbance: Secondary | ICD-10-CM | POA: Diagnosis not present

## 2016-03-15 DIAGNOSIS — I10 Essential (primary) hypertension: Secondary | ICD-10-CM | POA: Diagnosis not present

## 2016-03-15 DIAGNOSIS — F200 Paranoid schizophrenia: Secondary | ICD-10-CM | POA: Diagnosis not present

## 2016-03-15 DIAGNOSIS — M1990 Unspecified osteoarthritis, unspecified site: Secondary | ICD-10-CM | POA: Diagnosis not present

## 2016-03-15 DIAGNOSIS — E1142 Type 2 diabetes mellitus with diabetic polyneuropathy: Secondary | ICD-10-CM | POA: Diagnosis not present

## 2016-03-15 DIAGNOSIS — M5136 Other intervertebral disc degeneration, lumbar region: Secondary | ICD-10-CM | POA: Diagnosis not present

## 2016-03-18 DIAGNOSIS — M1990 Unspecified osteoarthritis, unspecified site: Secondary | ICD-10-CM | POA: Diagnosis not present

## 2016-03-18 DIAGNOSIS — M5136 Other intervertebral disc degeneration, lumbar region: Secondary | ICD-10-CM | POA: Diagnosis not present

## 2016-03-18 DIAGNOSIS — F0390 Unspecified dementia without behavioral disturbance: Secondary | ICD-10-CM | POA: Diagnosis not present

## 2016-03-18 DIAGNOSIS — I10 Essential (primary) hypertension: Secondary | ICD-10-CM | POA: Diagnosis not present

## 2016-03-18 DIAGNOSIS — F200 Paranoid schizophrenia: Secondary | ICD-10-CM | POA: Diagnosis not present

## 2016-03-18 DIAGNOSIS — E1142 Type 2 diabetes mellitus with diabetic polyneuropathy: Secondary | ICD-10-CM | POA: Diagnosis not present

## 2016-03-21 DIAGNOSIS — F200 Paranoid schizophrenia: Secondary | ICD-10-CM | POA: Diagnosis not present

## 2016-03-21 DIAGNOSIS — E1142 Type 2 diabetes mellitus with diabetic polyneuropathy: Secondary | ICD-10-CM | POA: Diagnosis not present

## 2016-03-21 DIAGNOSIS — M1990 Unspecified osteoarthritis, unspecified site: Secondary | ICD-10-CM | POA: Diagnosis not present

## 2016-03-21 DIAGNOSIS — I10 Essential (primary) hypertension: Secondary | ICD-10-CM | POA: Diagnosis not present

## 2016-03-21 DIAGNOSIS — M5136 Other intervertebral disc degeneration, lumbar region: Secondary | ICD-10-CM | POA: Diagnosis not present

## 2016-03-21 DIAGNOSIS — F0390 Unspecified dementia without behavioral disturbance: Secondary | ICD-10-CM | POA: Diagnosis not present

## 2016-03-25 ENCOUNTER — Other Ambulatory Visit: Payer: Self-pay | Admitting: Surgery

## 2016-03-25 DIAGNOSIS — C50912 Malignant neoplasm of unspecified site of left female breast: Secondary | ICD-10-CM | POA: Diagnosis not present

## 2016-03-25 DIAGNOSIS — C50512 Malignant neoplasm of lower-outer quadrant of left female breast: Secondary | ICD-10-CM

## 2016-03-25 DIAGNOSIS — Z170 Estrogen receptor positive status [ER+]: Principal | ICD-10-CM

## 2016-03-26 DIAGNOSIS — M5136 Other intervertebral disc degeneration, lumbar region: Secondary | ICD-10-CM | POA: Diagnosis not present

## 2016-03-26 DIAGNOSIS — F0390 Unspecified dementia without behavioral disturbance: Secondary | ICD-10-CM | POA: Diagnosis not present

## 2016-03-26 DIAGNOSIS — M1990 Unspecified osteoarthritis, unspecified site: Secondary | ICD-10-CM | POA: Diagnosis not present

## 2016-03-26 DIAGNOSIS — E1142 Type 2 diabetes mellitus with diabetic polyneuropathy: Secondary | ICD-10-CM | POA: Diagnosis not present

## 2016-03-26 DIAGNOSIS — I10 Essential (primary) hypertension: Secondary | ICD-10-CM | POA: Diagnosis not present

## 2016-03-26 DIAGNOSIS — F200 Paranoid schizophrenia: Secondary | ICD-10-CM | POA: Diagnosis not present

## 2016-03-28 DIAGNOSIS — M1990 Unspecified osteoarthritis, unspecified site: Secondary | ICD-10-CM | POA: Diagnosis not present

## 2016-03-28 DIAGNOSIS — F0390 Unspecified dementia without behavioral disturbance: Secondary | ICD-10-CM | POA: Diagnosis not present

## 2016-03-28 DIAGNOSIS — M5136 Other intervertebral disc degeneration, lumbar region: Secondary | ICD-10-CM | POA: Diagnosis not present

## 2016-03-28 DIAGNOSIS — F200 Paranoid schizophrenia: Secondary | ICD-10-CM | POA: Diagnosis not present

## 2016-03-28 DIAGNOSIS — E1142 Type 2 diabetes mellitus with diabetic polyneuropathy: Secondary | ICD-10-CM | POA: Diagnosis not present

## 2016-03-28 DIAGNOSIS — I10 Essential (primary) hypertension: Secondary | ICD-10-CM | POA: Diagnosis not present

## 2016-04-02 DIAGNOSIS — E1142 Type 2 diabetes mellitus with diabetic polyneuropathy: Secondary | ICD-10-CM | POA: Diagnosis not present

## 2016-04-02 DIAGNOSIS — M1990 Unspecified osteoarthritis, unspecified site: Secondary | ICD-10-CM | POA: Diagnosis not present

## 2016-04-02 DIAGNOSIS — I10 Essential (primary) hypertension: Secondary | ICD-10-CM | POA: Diagnosis not present

## 2016-04-02 DIAGNOSIS — F200 Paranoid schizophrenia: Secondary | ICD-10-CM | POA: Diagnosis not present

## 2016-04-02 DIAGNOSIS — F0390 Unspecified dementia without behavioral disturbance: Secondary | ICD-10-CM | POA: Diagnosis not present

## 2016-04-02 DIAGNOSIS — M5136 Other intervertebral disc degeneration, lumbar region: Secondary | ICD-10-CM | POA: Diagnosis not present

## 2016-04-30 DIAGNOSIS — F200 Paranoid schizophrenia: Secondary | ICD-10-CM | POA: Diagnosis not present

## 2016-04-30 DIAGNOSIS — Z79899 Other long term (current) drug therapy: Secondary | ICD-10-CM | POA: Diagnosis not present

## 2016-04-30 DIAGNOSIS — F251 Schizoaffective disorder, depressive type: Secondary | ICD-10-CM | POA: Diagnosis not present

## 2016-05-16 DIAGNOSIS — F251 Schizoaffective disorder, depressive type: Secondary | ICD-10-CM | POA: Diagnosis not present

## 2016-06-21 DIAGNOSIS — Z79899 Other long term (current) drug therapy: Secondary | ICD-10-CM | POA: Diagnosis not present

## 2016-06-21 DIAGNOSIS — F250 Schizoaffective disorder, bipolar type: Secondary | ICD-10-CM | POA: Diagnosis not present

## 2016-06-26 ENCOUNTER — Other Ambulatory Visit: Payer: Self-pay | Admitting: Surgery

## 2016-06-26 DIAGNOSIS — Z853 Personal history of malignant neoplasm of breast: Secondary | ICD-10-CM

## 2016-07-25 DIAGNOSIS — Z79899 Other long term (current) drug therapy: Secondary | ICD-10-CM | POA: Diagnosis not present

## 2016-07-25 DIAGNOSIS — F250 Schizoaffective disorder, bipolar type: Secondary | ICD-10-CM | POA: Diagnosis not present

## 2016-07-30 DIAGNOSIS — F209 Schizophrenia, unspecified: Secondary | ICD-10-CM | POA: Diagnosis not present

## 2016-08-13 DIAGNOSIS — L739 Follicular disorder, unspecified: Secondary | ICD-10-CM | POA: Diagnosis not present

## 2016-08-13 DIAGNOSIS — C50112 Malignant neoplasm of central portion of left female breast: Secondary | ICD-10-CM | POA: Diagnosis not present

## 2016-08-29 DIAGNOSIS — F209 Schizophrenia, unspecified: Secondary | ICD-10-CM | POA: Diagnosis not present

## 2016-09-19 DIAGNOSIS — Z79899 Other long term (current) drug therapy: Secondary | ICD-10-CM | POA: Diagnosis not present

## 2016-09-19 DIAGNOSIS — F250 Schizoaffective disorder, bipolar type: Secondary | ICD-10-CM | POA: Diagnosis not present

## 2016-09-26 ENCOUNTER — Ambulatory Visit: Payer: Medicare Other | Admitting: Podiatry

## 2016-09-26 DIAGNOSIS — F209 Schizophrenia, unspecified: Secondary | ICD-10-CM | POA: Diagnosis not present

## 2016-10-03 ENCOUNTER — Ambulatory Visit: Payer: Medicare Other | Admitting: Podiatry

## 2016-10-10 DIAGNOSIS — Z853 Personal history of malignant neoplasm of breast: Secondary | ICD-10-CM | POA: Diagnosis not present

## 2016-10-10 DIAGNOSIS — R921 Mammographic calcification found on diagnostic imaging of breast: Secondary | ICD-10-CM | POA: Diagnosis not present

## 2016-10-10 DIAGNOSIS — N6311 Unspecified lump in the right breast, upper outer quadrant: Secondary | ICD-10-CM | POA: Diagnosis not present

## 2016-10-14 ENCOUNTER — Other Ambulatory Visit: Payer: Self-pay | Admitting: Radiology

## 2016-10-14 DIAGNOSIS — R921 Mammographic calcification found on diagnostic imaging of breast: Secondary | ICD-10-CM | POA: Diagnosis not present

## 2016-10-14 DIAGNOSIS — D241 Benign neoplasm of right breast: Secondary | ICD-10-CM | POA: Diagnosis not present

## 2016-10-18 DIAGNOSIS — E1165 Type 2 diabetes mellitus with hyperglycemia: Secondary | ICD-10-CM | POA: Diagnosis not present

## 2016-10-18 DIAGNOSIS — G629 Polyneuropathy, unspecified: Secondary | ICD-10-CM | POA: Diagnosis not present

## 2016-10-18 DIAGNOSIS — F0390 Unspecified dementia without behavioral disturbance: Secondary | ICD-10-CM | POA: Diagnosis not present

## 2016-10-18 DIAGNOSIS — D649 Anemia, unspecified: Secondary | ICD-10-CM | POA: Diagnosis not present

## 2016-10-18 DIAGNOSIS — Z79899 Other long term (current) drug therapy: Secondary | ICD-10-CM | POA: Diagnosis not present

## 2016-10-18 DIAGNOSIS — E7800 Pure hypercholesterolemia, unspecified: Secondary | ICD-10-CM | POA: Diagnosis not present

## 2016-10-18 DIAGNOSIS — I10 Essential (primary) hypertension: Secondary | ICD-10-CM | POA: Diagnosis not present

## 2016-10-18 DIAGNOSIS — F209 Schizophrenia, unspecified: Secondary | ICD-10-CM | POA: Diagnosis not present

## 2016-10-18 DIAGNOSIS — E1140 Type 2 diabetes mellitus with diabetic neuropathy, unspecified: Secondary | ICD-10-CM | POA: Diagnosis not present

## 2016-10-24 DIAGNOSIS — Z79899 Other long term (current) drug therapy: Secondary | ICD-10-CM | POA: Diagnosis not present

## 2016-10-24 DIAGNOSIS — F250 Schizoaffective disorder, bipolar type: Secondary | ICD-10-CM | POA: Diagnosis not present

## 2016-11-01 DIAGNOSIS — Z79899 Other long term (current) drug therapy: Secondary | ICD-10-CM | POA: Diagnosis not present

## 2016-11-12 DIAGNOSIS — J329 Chronic sinusitis, unspecified: Secondary | ICD-10-CM | POA: Diagnosis not present

## 2016-11-12 DIAGNOSIS — R42 Dizziness and giddiness: Secondary | ICD-10-CM | POA: Diagnosis not present

## 2016-11-12 DIAGNOSIS — I10 Essential (primary) hypertension: Secondary | ICD-10-CM | POA: Diagnosis not present

## 2016-11-20 DIAGNOSIS — I10 Essential (primary) hypertension: Secondary | ICD-10-CM | POA: Diagnosis not present

## 2016-11-20 DIAGNOSIS — F0390 Unspecified dementia without behavioral disturbance: Secondary | ICD-10-CM | POA: Diagnosis not present

## 2016-11-20 DIAGNOSIS — D649 Anemia, unspecified: Secondary | ICD-10-CM | POA: Diagnosis not present

## 2016-11-20 DIAGNOSIS — E1165 Type 2 diabetes mellitus with hyperglycemia: Secondary | ICD-10-CM | POA: Diagnosis not present

## 2016-11-20 DIAGNOSIS — G629 Polyneuropathy, unspecified: Secondary | ICD-10-CM | POA: Diagnosis not present

## 2016-11-20 DIAGNOSIS — R0981 Nasal congestion: Secondary | ICD-10-CM | POA: Diagnosis not present

## 2016-11-20 DIAGNOSIS — R42 Dizziness and giddiness: Secondary | ICD-10-CM | POA: Diagnosis not present

## 2016-12-06 DIAGNOSIS — Z79899 Other long term (current) drug therapy: Secondary | ICD-10-CM | POA: Diagnosis not present

## 2016-12-06 DIAGNOSIS — F250 Schizoaffective disorder, bipolar type: Secondary | ICD-10-CM | POA: Diagnosis not present

## 2016-12-27 DIAGNOSIS — Z79899 Other long term (current) drug therapy: Secondary | ICD-10-CM | POA: Diagnosis not present

## 2016-12-27 DIAGNOSIS — E7800 Pure hypercholesterolemia, unspecified: Secondary | ICD-10-CM | POA: Diagnosis not present

## 2016-12-27 DIAGNOSIS — E1165 Type 2 diabetes mellitus with hyperglycemia: Secondary | ICD-10-CM | POA: Diagnosis not present

## 2016-12-27 DIAGNOSIS — I10 Essential (primary) hypertension: Secondary | ICD-10-CM | POA: Diagnosis not present

## 2017-01-07 DIAGNOSIS — Z79899 Other long term (current) drug therapy: Secondary | ICD-10-CM | POA: Diagnosis not present

## 2017-01-07 DIAGNOSIS — F250 Schizoaffective disorder, bipolar type: Secondary | ICD-10-CM | POA: Diagnosis not present

## 2017-01-24 DIAGNOSIS — Z79899 Other long term (current) drug therapy: Secondary | ICD-10-CM | POA: Diagnosis not present

## 2017-01-24 DIAGNOSIS — E1165 Type 2 diabetes mellitus with hyperglycemia: Secondary | ICD-10-CM | POA: Diagnosis not present

## 2017-02-21 DIAGNOSIS — F209 Schizophrenia, unspecified: Secondary | ICD-10-CM | POA: Diagnosis not present

## 2017-02-27 DIAGNOSIS — M545 Low back pain: Secondary | ICD-10-CM | POA: Diagnosis not present

## 2017-02-27 DIAGNOSIS — E1165 Type 2 diabetes mellitus with hyperglycemia: Secondary | ICD-10-CM | POA: Diagnosis not present

## 2017-02-27 DIAGNOSIS — I10 Essential (primary) hypertension: Secondary | ICD-10-CM | POA: Diagnosis not present

## 2017-03-01 IMAGING — CR DG LUMBAR SPINE COMPLETE 4+V
5 series · 5 of 5 positions shown · non-contrast
Comparison: 11/01/2015 abdominal film.  01/02/2012 lumbar spine MR.

CLINICAL DATA: Pain lumbar region for 3 days. No known injury.
Initial encounter.

EXAM:
LUMBAR SPINE - COMPLETE 4+ VIEW

[t lumbar spine ap]
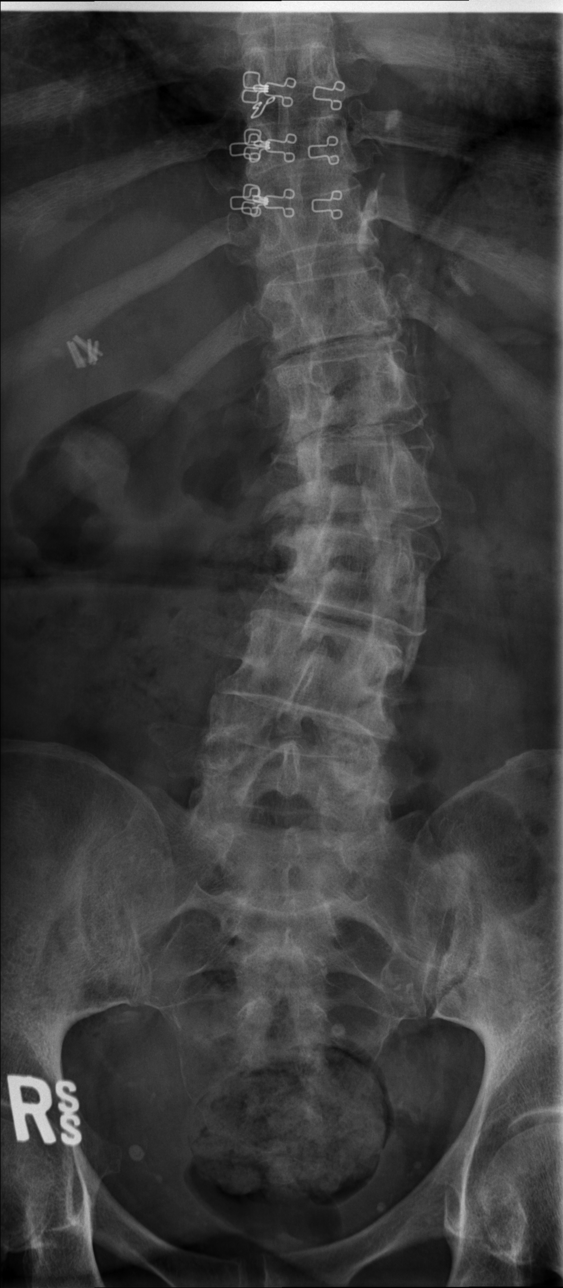

[t lumbar spine obl (1 of 2)]
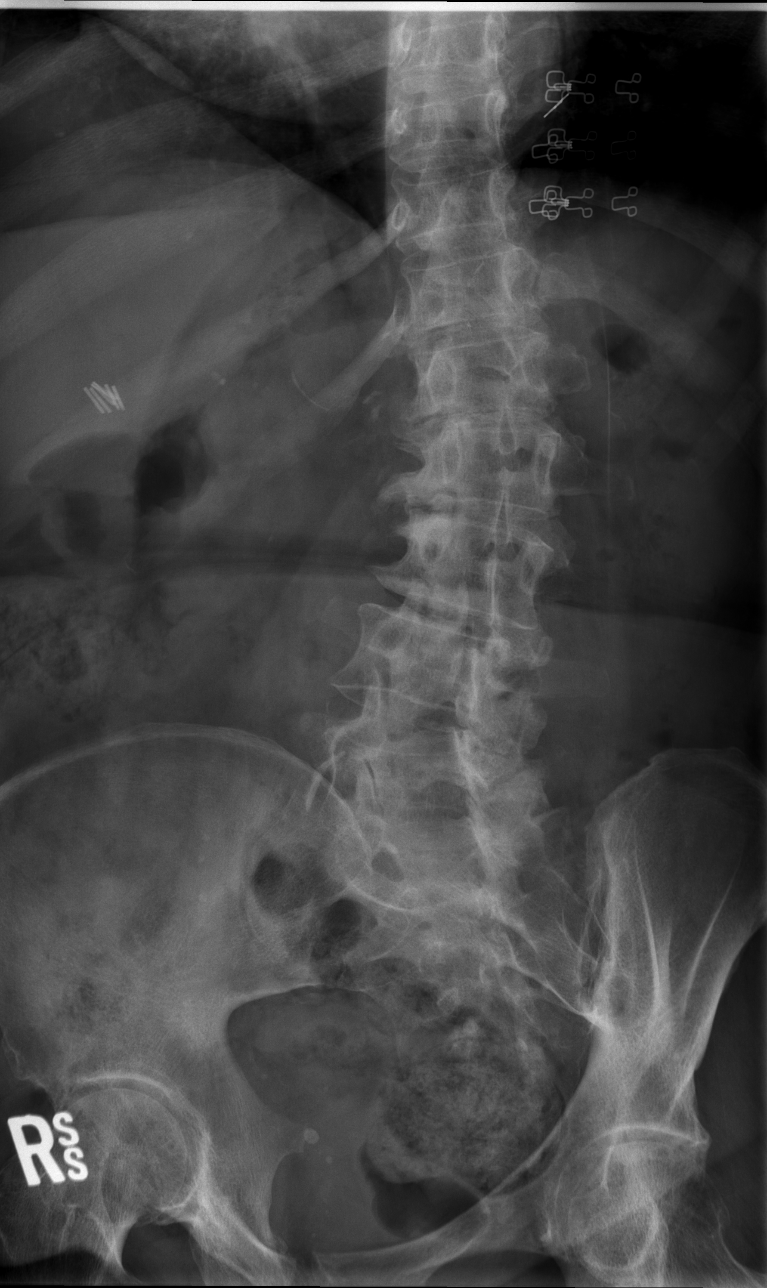

[t lumbar spine obl (2 of 2)]
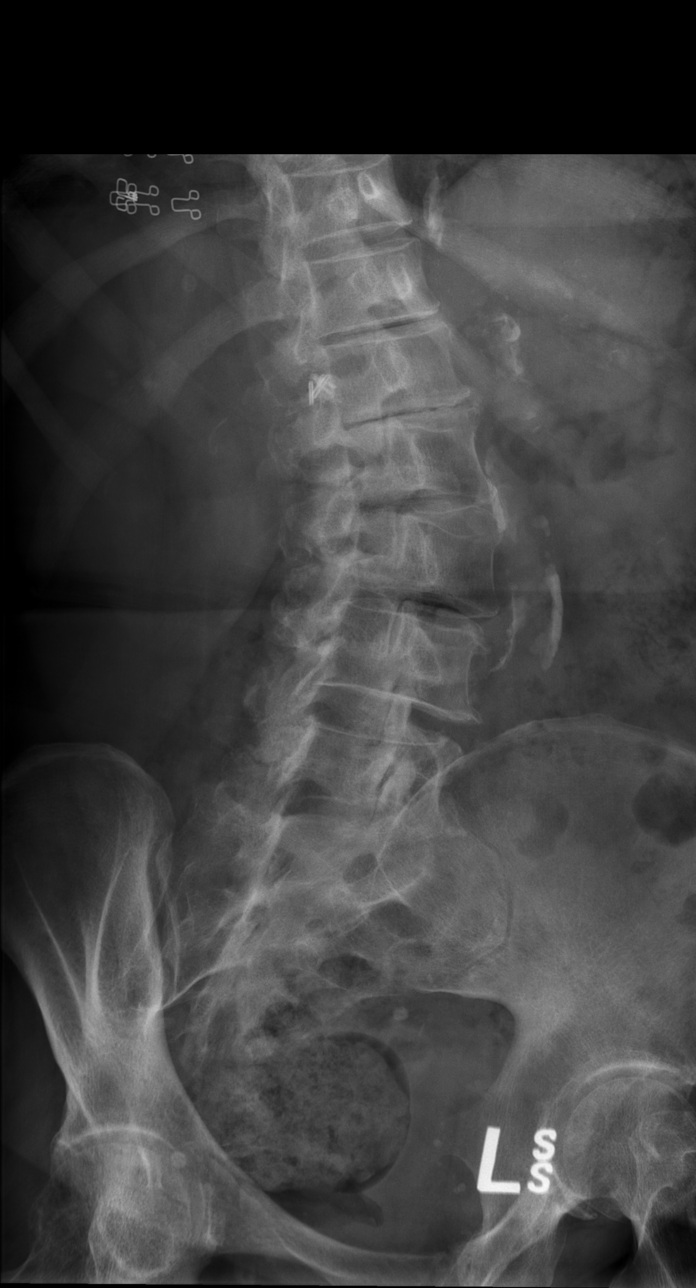

[t lumbar spine lat]
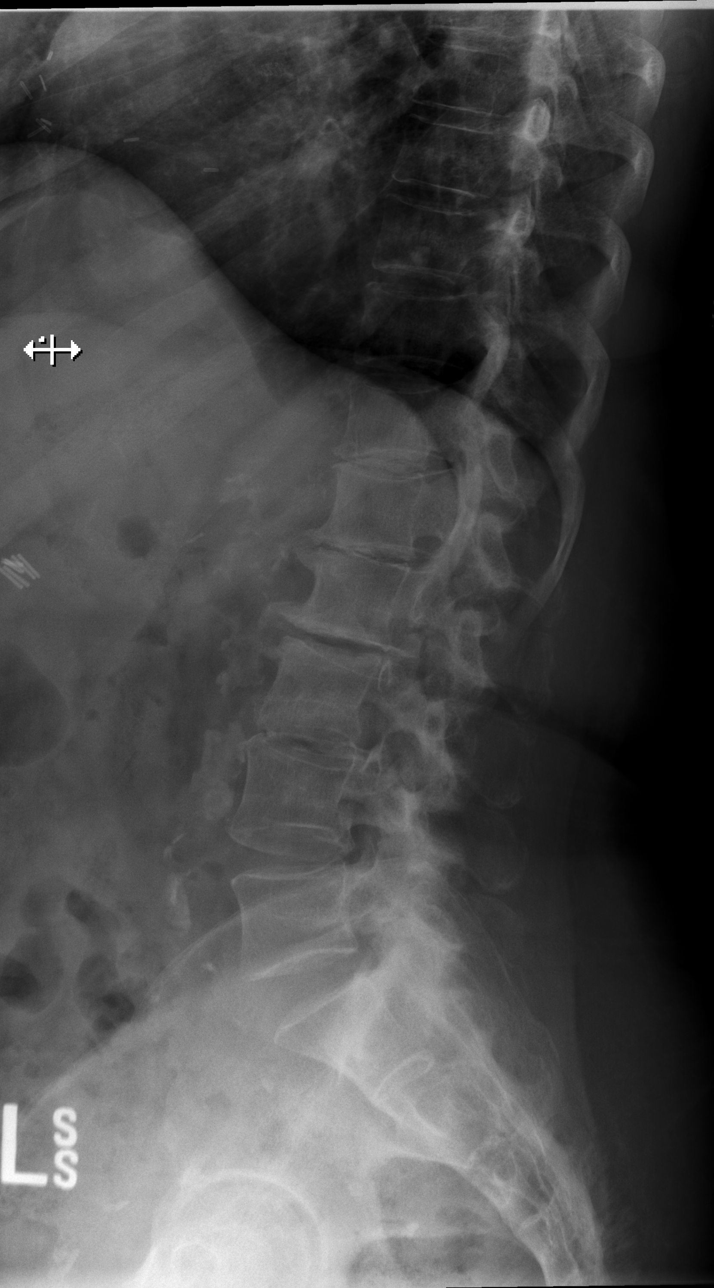

[t lumbar l-5 s-1 spot]
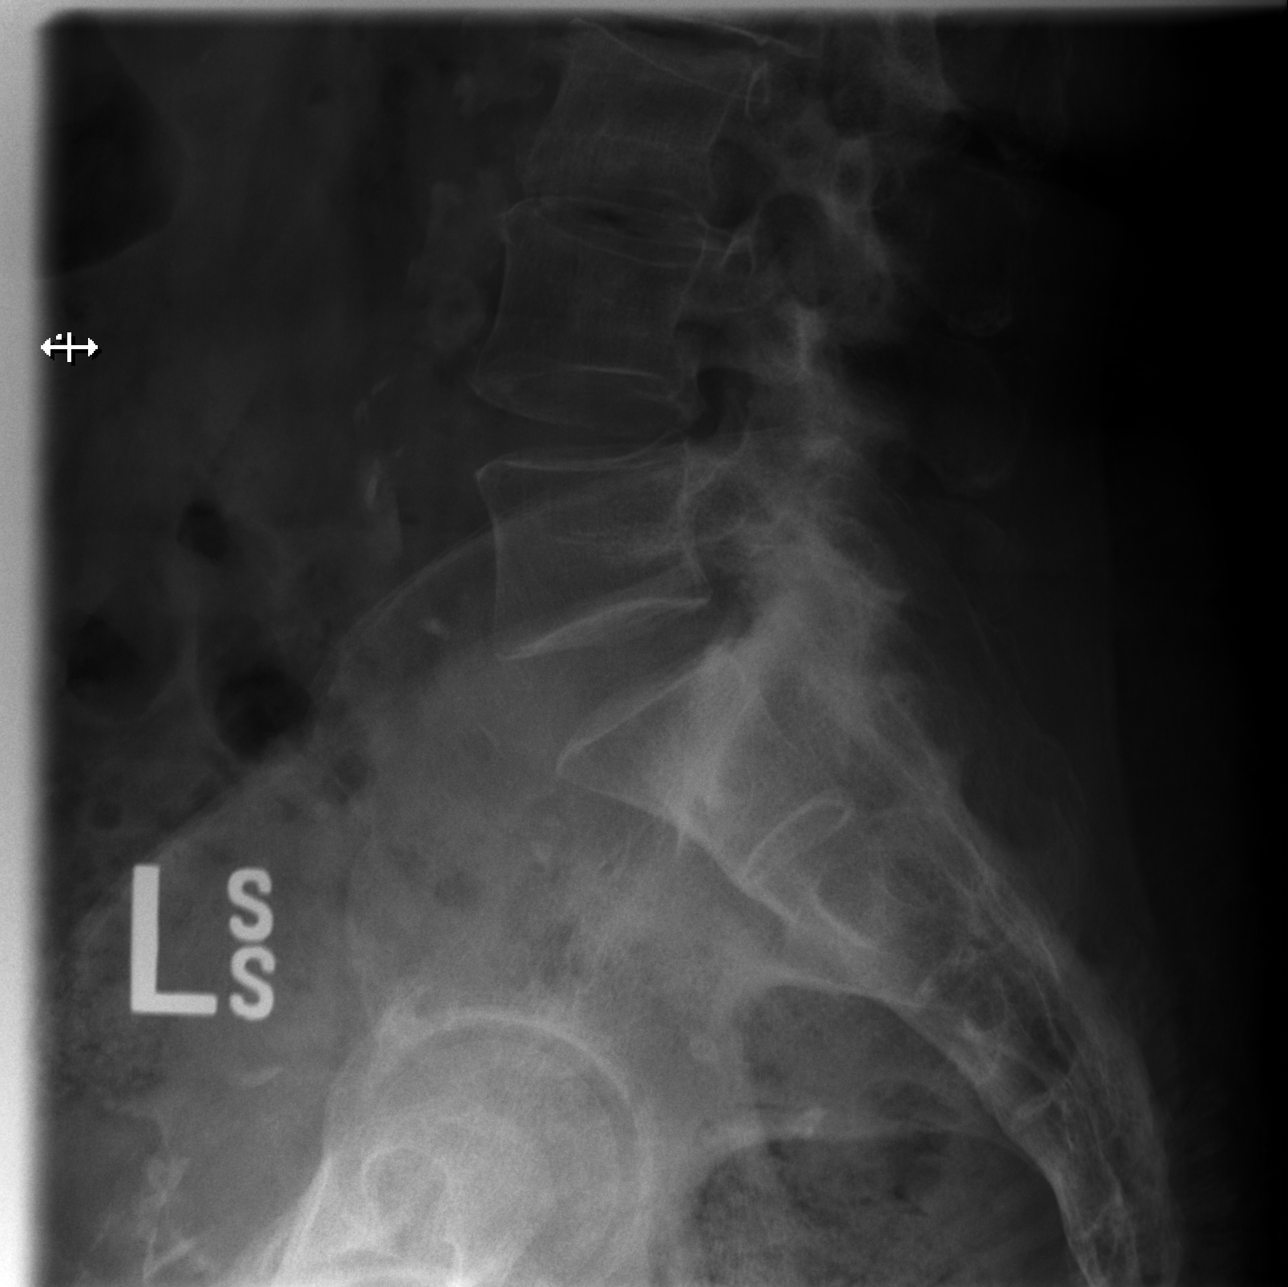

[5 of 5 positions shown; findings below may reference images not displayed]

FINDINGS: Moderate scoliosis lumbar spine convex to left with significant disc
space narrowing and osteophyte formation upper lumbar region most
notable on the right at the L2-3 level. Findings have progressed
since the prior MR.

No pars defect detected.

Vascular calcifications.
IMPRESSION: Progressive degenerative changes throughout the lumbar spine with
disc space narrowing and osteophyte formation most notable on the
right at the L2-3 level.

Aortic atherosclerosis.

## 2017-04-03 DIAGNOSIS — F209 Schizophrenia, unspecified: Secondary | ICD-10-CM | POA: Diagnosis not present

## 2017-05-01 DIAGNOSIS — G629 Polyneuropathy, unspecified: Secondary | ICD-10-CM | POA: Diagnosis not present

## 2017-05-01 DIAGNOSIS — Z7984 Long term (current) use of oral hypoglycemic drugs: Secondary | ICD-10-CM | POA: Diagnosis not present

## 2017-05-01 DIAGNOSIS — E1165 Type 2 diabetes mellitus with hyperglycemia: Secondary | ICD-10-CM | POA: Diagnosis not present

## 2017-05-01 DIAGNOSIS — I10 Essential (primary) hypertension: Secondary | ICD-10-CM | POA: Diagnosis not present

## 2017-05-01 DIAGNOSIS — E1140 Type 2 diabetes mellitus with diabetic neuropathy, unspecified: Secondary | ICD-10-CM | POA: Diagnosis not present

## 2017-05-01 DIAGNOSIS — E7800 Pure hypercholesterolemia, unspecified: Secondary | ICD-10-CM | POA: Diagnosis not present

## 2017-05-08 DIAGNOSIS — Z79899 Other long term (current) drug therapy: Secondary | ICD-10-CM | POA: Diagnosis not present

## 2017-05-08 DIAGNOSIS — F24 Shared psychotic disorder: Secondary | ICD-10-CM | POA: Diagnosis not present

## 2017-06-10 DIAGNOSIS — Z79899 Other long term (current) drug therapy: Secondary | ICD-10-CM | POA: Diagnosis not present

## 2017-06-10 DIAGNOSIS — F250 Schizoaffective disorder, bipolar type: Secondary | ICD-10-CM | POA: Diagnosis not present

## 2017-06-27 DIAGNOSIS — R42 Dizziness and giddiness: Secondary | ICD-10-CM | POA: Diagnosis not present

## 2017-06-27 DIAGNOSIS — Z79899 Other long term (current) drug therapy: Secondary | ICD-10-CM | POA: Diagnosis not present

## 2017-06-27 DIAGNOSIS — M5136 Other intervertebral disc degeneration, lumbar region: Secondary | ICD-10-CM | POA: Diagnosis not present

## 2017-06-27 DIAGNOSIS — I10 Essential (primary) hypertension: Secondary | ICD-10-CM | POA: Diagnosis not present

## 2017-06-27 DIAGNOSIS — R5383 Other fatigue: Secondary | ICD-10-CM | POA: Diagnosis not present

## 2017-06-30 ENCOUNTER — Other Ambulatory Visit: Payer: Self-pay | Admitting: Family Medicine

## 2017-06-30 DIAGNOSIS — R42 Dizziness and giddiness: Secondary | ICD-10-CM

## 2017-07-01 DIAGNOSIS — F209 Schizophrenia, unspecified: Secondary | ICD-10-CM | POA: Diagnosis not present

## 2017-07-09 DIAGNOSIS — M25551 Pain in right hip: Secondary | ICD-10-CM | POA: Diagnosis not present

## 2017-07-09 DIAGNOSIS — M545 Low back pain: Secondary | ICD-10-CM | POA: Diagnosis not present

## 2017-07-19 ENCOUNTER — Other Ambulatory Visit: Payer: Self-pay

## 2017-07-19 ENCOUNTER — Ambulatory Visit (HOSPITAL_COMMUNITY)
Admission: EM | Admit: 2017-07-19 | Discharge: 2017-07-19 | Disposition: A | Discharge status: Home / Self Care | Payer: Medicare Other | Attending: Family Medicine | Admitting: Family Medicine

## 2017-07-19 ENCOUNTER — Encounter (HOSPITAL_COMMUNITY): Payer: Self-pay | Admitting: Emergency Medicine

## 2017-07-19 ENCOUNTER — Ambulatory Visit (INDEPENDENT_AMBULATORY_CARE_PROVIDER_SITE_OTHER): Payer: Medicare Other

## 2017-07-19 DIAGNOSIS — R05 Cough: Secondary | ICD-10-CM

## 2017-07-19 DIAGNOSIS — R0989 Other specified symptoms and signs involving the circulatory and respiratory systems: Secondary | ICD-10-CM

## 2017-07-19 DIAGNOSIS — R059 Cough, unspecified: Secondary | ICD-10-CM

## 2017-07-19 MED ORDER — AZITHROMYCIN 250 MG PO TABS: 250.0000 mg | ORAL_TABLET | Freq: Every day | ORAL | 0 refills | Status: DC

## 2017-07-19 NOTE — ED Triage Notes (Signed)
Pt reports congestion, weakness and dizziness x2 weeks that is getting progressively worse.  She states she is choking on her mucus when she lays down at night.

## 2017-07-19 NOTE — ED Provider Notes (Signed)
[facility]   [id number] 07/19/17 Arrival Time: 1204  ASSESSMENT & PLAN:  1. Cough   2. Chest congestion     Meds ordered this encounter  Medications  . azithromycin (ZITHROMAX) 250 MG tablet    Sig: Take 1 tablet (250 mg total) by mouth daily. Take first 2 tablets together, then 1 every day until finished.    Dispense:  6 tablet    Refill:  0   Given duration of symptoms will treat. OTC symptom care as needed. Ensure adequate fluid intake and rest. May f/u with PCP or here as needed.  Reviewed expectations re: course of current medical issues. Questions answered. Outlined signs and symptoms indicating need for more acute intervention. Patient verbalized understanding. After Visit Summary given.   SUBJECTIVE: History from: patient and family.  [name] is a 77 y.o. female who presents with complaint of nasal congestion, post-nasal drainage, and a persistent dry cough. Onset gradually, over the past 2 weeks ago. Overall fatigued with body aches. SOB: none. Wheezing: none. "Coughing up a lot of mucous." Fever: no. Overall normal PO intake without n/v. Sick contacts: no. Occasional lightheadedness. Not drinking as much as usual. OTC treatment: Vicks rub with mild relief.   Social History   Tobacco Use  Smoking Status Former Smoker  Smokeless Tobacco Never Used    ROS: As per HPI.   OBJECTIVE:  Vitals:   07/19/17 1235  BP: 118/85  Pulse: 84  Temp: 98.4 F (36.9 C)  TempSrc: Oral  SpO2: 98%     General appearance: alert; appears fatigued HEENT: nasal congestion; clear runny nose; throat irritation secondary to post-nasal drainage Neck: supple without LAD Lungs: unlabored respirations, symmetrical air entry; cough: mild; no respiratory distress Skin: warm and dry Psychological: alert and cooperative; normal mood and affect  Imaging: Dg Chest 2 View  Result Date: 07/19/2017 CLINICAL DATA:  Cough.  Allergies. EXAM: CHEST - 2 VIEW  COMPARISON:  11/21/2015 FINDINGS: Midline trachea. Mild cardiomegaly. Tortuous thoracic aorta. No pleural effusion or pneumothorax. Surgical clips project over the left breast/chest wall. Minimal scarring in the medial left lung base. IMPRESSION: No acute cardiopulmonary disease. Cardiomegaly without congestive failure. Electronically Signed   By: Abigail Miyamoto M.D.   On: 07/19/2017 13:27    Allergies  Allergen Reactions  . Codeine Phosphate Other (See Comments)    REACTION: unspecified  . Glimepiride Other (See Comments)    REACTION: unspecified  . Glyburide Other (See Comments)    REACTION: unspecified  . Levofloxacin Nausea Only    Made her weak and nauseated  . Shellfish Allergy Swelling  . Tramadol Other (See Comments)    unknown    Past Medical History:  Diagnosis Date  . Allergic rhinitis   . Anemia, iron deficiency   . Anxiety   . Arthritis    back  . CAD (coronary artery disease)    CAD stent placed 2005  . Cancer ([place])    Left breast invasive ductal carcinoma  . Dementia   . Esophageal motility disorder   . GERD (gastroesophageal reflux disease)   . HTN (hypertension)   . Hyperlipidemia   . IBS (irritable bowel syndrome)   . Peripheral neuropathy   . Pneumonia 12-09-2014   adm with CAP requiring mechanical ventilation x 10d  . PVD (peripheral vascular disease) ([facility])    Left subclavian 70% stenosis  . Renal insufficiency   . Schizophrenia ([place])    paranoid schizophrenic  . Syncope    EP study  1996 w/atrial tachycardia  . Type II or unspecified type diabetes mellitus without mention of complication, not stated as uncontrolled    Family History  Problem Relation Age of Onset  . Hypertension Other   . Diabetes Other   . Schizophrenia Neg Hx   . Coronary artery disease Neg Hx   . Colon cancer Neg Hx   . Breast cancer Neg Hx    Social History   Socioeconomic History  . Marital status: Single    Spouse name: Not on file  . Number of children: Not on file    . Years of education: Not on file  . Highest education level: Not on file  Occupational History  . Not on file  Social Needs  . Financial resource strain: Not on file  . Food insecurity:    Worry: Not on file    Inability: Not on file  . Transportation needs:    Medical: Not on file    Non-medical: Not on file  Tobacco Use  . Smoking status: Former Research scientist (life sciences)  . Smokeless tobacco: Never Used  Substance and Sexual Activity  . Alcohol use: No  . Drug use: No  . Sexual activity: Never  Lifestyle  . Physical activity:    Days per week: Not on file    Minutes per session: Not on file  . Stress: Not on file  Relationships  . Social connections:    Talks on phone: Not on file    Gets together: Not on file    Attends religious service: Not on file    Active member of club or organization: Not on file    Attends meetings of clubs or organizations: Not on file    Relationship status: Not on file  . Intimate partner violence:    Fear of current or ex partner: Not on file    Emotionally abused: Not on file    Physically abused: Not on file    Forced sexual activity: Not on file  Other Topics Concern  . Not on file  Social History Narrative   11th grade education  Never married  5 children: 4 daughters, 1 son, 7 grandchildren  Lives alone  Has supportive family                    Vanessa Kick, MD 07/19/17 1349

## 2017-08-06 DIAGNOSIS — M545 Low back pain: Secondary | ICD-10-CM | POA: Diagnosis not present

## 2017-08-07 DIAGNOSIS — F209 Schizophrenia, unspecified: Secondary | ICD-10-CM | POA: Diagnosis not present

## 2017-08-14 DIAGNOSIS — L739 Follicular disorder, unspecified: Secondary | ICD-10-CM | POA: Diagnosis not present

## 2017-08-14 DIAGNOSIS — Z6828 Body mass index (BMI) 28.0-28.9, adult: Secondary | ICD-10-CM | POA: Diagnosis not present

## 2017-08-14 DIAGNOSIS — Z01411 Encounter for gynecological examination (general) (routine) with abnormal findings: Secondary | ICD-10-CM | POA: Diagnosis not present

## 2017-09-09 DIAGNOSIS — F209 Schizophrenia, unspecified: Secondary | ICD-10-CM | POA: Diagnosis not present

## 2017-09-12 DIAGNOSIS — R112 Nausea with vomiting, unspecified: Secondary | ICD-10-CM | POA: Diagnosis not present

## 2017-09-12 DIAGNOSIS — R42 Dizziness and giddiness: Secondary | ICD-10-CM | POA: Diagnosis not present

## 2017-10-03 DIAGNOSIS — K210 Gastro-esophageal reflux disease with esophagitis: Secondary | ICD-10-CM | POA: Diagnosis not present

## 2017-10-06 DIAGNOSIS — H2513 Age-related nuclear cataract, bilateral: Secondary | ICD-10-CM | POA: Diagnosis not present

## 2017-10-06 DIAGNOSIS — E119 Type 2 diabetes mellitus without complications: Secondary | ICD-10-CM | POA: Diagnosis not present

## 2017-10-06 DIAGNOSIS — Z794 Long term (current) use of insulin: Secondary | ICD-10-CM | POA: Diagnosis not present

## 2017-10-06 DIAGNOSIS — H25013 Cortical age-related cataract, bilateral: Secondary | ICD-10-CM | POA: Diagnosis not present

## 2017-10-09 DIAGNOSIS — F209 Schizophrenia, unspecified: Secondary | ICD-10-CM | POA: Diagnosis not present

## 2017-10-13 DIAGNOSIS — R928 Other abnormal and inconclusive findings on diagnostic imaging of breast: Secondary | ICD-10-CM | POA: Diagnosis not present

## 2017-11-12 DIAGNOSIS — M545 Low back pain: Secondary | ICD-10-CM | POA: Diagnosis not present

## 2017-11-12 DIAGNOSIS — M25532 Pain in left wrist: Secondary | ICD-10-CM | POA: Diagnosis not present

## 2017-11-12 DIAGNOSIS — M25531 Pain in right wrist: Secondary | ICD-10-CM | POA: Diagnosis not present

## 2017-11-13 DIAGNOSIS — F209 Schizophrenia, unspecified: Secondary | ICD-10-CM | POA: Diagnosis not present

## 2017-11-13 DIAGNOSIS — Z79899 Other long term (current) drug therapy: Secondary | ICD-10-CM | POA: Diagnosis not present

## 2017-11-15 ENCOUNTER — Emergency Department (HOSPITAL_COMMUNITY): Payer: Medicare Other

## 2017-11-15 ENCOUNTER — Encounter (HOSPITAL_COMMUNITY): Payer: Self-pay

## 2017-11-15 ENCOUNTER — Inpatient Hospital Stay (HOSPITAL_COMMUNITY)
Admission: EM | Admit: 2017-11-15 | Discharge: 2017-11-21 | DRG: 070 | Disposition: A | Discharge status: Home Health | Payer: Medicare Other | Attending: Family Medicine | Admitting: Family Medicine

## 2017-11-15 DIAGNOSIS — I739 Peripheral vascular disease, unspecified: Secondary | ICD-10-CM | POA: Diagnosis present

## 2017-11-15 DIAGNOSIS — R531 Weakness: Secondary | ICD-10-CM | POA: Diagnosis not present

## 2017-11-15 DIAGNOSIS — F200 Paranoid schizophrenia: Secondary | ICD-10-CM

## 2017-11-15 DIAGNOSIS — I10 Essential (primary) hypertension: Secondary | ICD-10-CM | POA: Diagnosis present

## 2017-11-15 DIAGNOSIS — G9341 Metabolic encephalopathy: Principal | ICD-10-CM | POA: Diagnosis present

## 2017-11-15 DIAGNOSIS — Z09 Encounter for follow-up examination after completed treatment for conditions other than malignant neoplasm: Secondary | ICD-10-CM

## 2017-11-15 DIAGNOSIS — Z90710 Acquired absence of both cervix and uterus: Secondary | ICD-10-CM

## 2017-11-15 DIAGNOSIS — R443 Hallucinations, unspecified: Secondary | ICD-10-CM | POA: Diagnosis not present

## 2017-11-15 DIAGNOSIS — R42 Dizziness and giddiness: Secondary | ICD-10-CM | POA: Diagnosis not present

## 2017-11-15 DIAGNOSIS — I509 Heart failure, unspecified: Secondary | ICD-10-CM | POA: Diagnosis not present

## 2017-11-15 DIAGNOSIS — Z96651 Presence of right artificial knee joint: Secondary | ICD-10-CM | POA: Diagnosis present

## 2017-11-15 DIAGNOSIS — Z79899 Other long term (current) drug therapy: Secondary | ICD-10-CM

## 2017-11-15 DIAGNOSIS — E785 Hyperlipidemia, unspecified: Secondary | ICD-10-CM | POA: Diagnosis present

## 2017-11-15 DIAGNOSIS — Z955 Presence of coronary angioplasty implant and graft: Secondary | ICD-10-CM

## 2017-11-15 DIAGNOSIS — I160 Hypertensive urgency: Secondary | ICD-10-CM | POA: Diagnosis not present

## 2017-11-15 DIAGNOSIS — E119 Type 2 diabetes mellitus without complications: Secondary | ICD-10-CM

## 2017-11-15 DIAGNOSIS — Z9119 Patient's noncompliance with other medical treatment and regimen: Secondary | ICD-10-CM

## 2017-11-15 DIAGNOSIS — Z881 Allergy status to other antibiotic agents status: Secondary | ICD-10-CM

## 2017-11-15 DIAGNOSIS — R4182 Altered mental status, unspecified: Secondary | ICD-10-CM

## 2017-11-15 DIAGNOSIS — R4020 Unspecified coma: Secondary | ICD-10-CM | POA: Diagnosis not present

## 2017-11-15 DIAGNOSIS — C50412 Malignant neoplasm of upper-outer quadrant of left female breast: Secondary | ICD-10-CM | POA: Diagnosis present

## 2017-11-15 DIAGNOSIS — I5033 Acute on chronic diastolic (congestive) heart failure: Secondary | ICD-10-CM | POA: Diagnosis present

## 2017-11-15 DIAGNOSIS — I2510 Atherosclerotic heart disease of native coronary artery without angina pectoris: Secondary | ICD-10-CM | POA: Diagnosis present

## 2017-11-15 DIAGNOSIS — Z794 Long term (current) use of insulin: Secondary | ICD-10-CM

## 2017-11-15 DIAGNOSIS — R197 Diarrhea, unspecified: Secondary | ICD-10-CM | POA: Diagnosis not present

## 2017-11-15 DIAGNOSIS — I110 Hypertensive heart disease with heart failure: Secondary | ICD-10-CM | POA: Diagnosis present

## 2017-11-15 DIAGNOSIS — K219 Gastro-esophageal reflux disease without esophagitis: Secondary | ICD-10-CM | POA: Diagnosis present

## 2017-11-15 DIAGNOSIS — Z885 Allergy status to narcotic agent status: Secondary | ICD-10-CM

## 2017-11-15 DIAGNOSIS — Z8249 Family history of ischemic heart disease and other diseases of the circulatory system: Secondary | ICD-10-CM

## 2017-11-15 DIAGNOSIS — K580 Irritable bowel syndrome with diarrhea: Secondary | ICD-10-CM | POA: Diagnosis present

## 2017-11-15 DIAGNOSIS — G259 Extrapyramidal and movement disorder, unspecified: Secondary | ICD-10-CM | POA: Diagnosis present

## 2017-11-15 DIAGNOSIS — Z888 Allergy status to other drugs, medicaments and biological substances status: Secondary | ICD-10-CM

## 2017-11-15 DIAGNOSIS — D72829 Elevated white blood cell count, unspecified: Secondary | ICD-10-CM | POA: Diagnosis present

## 2017-11-15 DIAGNOSIS — F0390 Unspecified dementia without behavioral disturbance: Secondary | ICD-10-CM | POA: Diagnosis present

## 2017-11-15 DIAGNOSIS — Z87891 Personal history of nicotine dependence: Secondary | ICD-10-CM

## 2017-11-15 DIAGNOSIS — R509 Fever, unspecified: Secondary | ICD-10-CM | POA: Diagnosis present

## 2017-11-15 DIAGNOSIS — E1151 Type 2 diabetes mellitus with diabetic peripheral angiopathy without gangrene: Secondary | ICD-10-CM | POA: Diagnosis present

## 2017-11-15 LAB — CBC WITH DIFFERENTIAL/PLATELET
Abs Immature Granulocytes: 0 10*3/uL (ref 0.0–0.1)
BASOS ABS: 0 10*3/uL (ref 0.0–0.1)
Basophils Relative: 0 %
Eosinophils Absolute: 0 10*3/uL (ref 0.0–0.7)
Eosinophils Relative: 0 %
HCT: 39.9 % (ref 36.0–46.0)
Hemoglobin: 12.2 g/dL (ref 12.0–15.0)
IMMATURE GRANULOCYTES: 0 %
Lymphocytes Relative: 25 %
Lymphs Abs: 2.3 10*3/uL (ref 0.7–4.0)
MCH: 29.5 pg (ref 26.0–34.0)
MCHC: 30.6 g/dL (ref 30.0–36.0)
MCV: 96.6 fL (ref 78.0–100.0)
MONO ABS: 0.5 10*3/uL (ref 0.1–1.0)
Monocytes Relative: 5 %
Neutro Abs: 6.2 10*3/uL (ref 1.7–7.7)
Neutrophils Relative %: 70 %
Platelets: 290 10*3/uL (ref 150–400)
RBC: 4.13 MIL/uL (ref 3.87–5.11)
RDW: 14.2 % (ref 11.5–15.5)
WBC: 9.1 10*3/uL (ref 4.0–10.5)

## 2017-11-15 LAB — COMPREHENSIVE METABOLIC PANEL
ALT: 26 U/L (ref 0–44)
AST: 24 U/L (ref 15–41)
Albumin: 3.7 g/dL (ref 3.5–5.0)
Alkaline Phosphatase: 90 U/L (ref 38–126)
Anion gap: 9 (ref 5–15)
BILIRUBIN TOTAL: 0.5 mg/dL (ref 0.3–1.2)
BUN: 11 mg/dL (ref 8–23)
CO2: 25 mmol/L (ref 22–32)
CREATININE: 0.94 mg/dL (ref 0.44–1.00)
Calcium: 9.6 mg/dL (ref 8.9–10.3)
Chloride: 111 mmol/L (ref 98–111)
GFR calc Af Amer: >60 mL/min (ref >60)
GFR calc non Af Amer: 57 mL/min — ABNORMAL LOW (ref >60)
Glucose, Bld: 111 mg/dL — ABNORMAL HIGH (ref 70–99)
POTASSIUM: 3.8 mmol/L (ref 3.5–5.1)
Sodium: 145 mmol/L (ref 135–145)
Total Protein: 7.6 g/dL (ref 6.5–8.1)

## 2017-11-15 LAB — CBG MONITORING, ED: Glucose-Capillary: 104 mg/dL — ABNORMAL HIGH (ref 70–99)

## 2017-11-15 LAB — URINALYSIS, ROUTINE W REFLEX MICROSCOPIC
Bilirubin Urine: NEGATIVE
GLUCOSE, UA: NEGATIVE mg/dL
Hgb urine dipstick: NEGATIVE
Ketones, ur: NEGATIVE mg/dL
LEUKOCYTES UA: NEGATIVE
Nitrite: NEGATIVE
PH: 8 (ref 5.0–8.0)
Protein, ur: NEGATIVE mg/dL
Specific Gravity, Urine: 1.01 (ref 1.005–1.030)

## 2017-11-15 LAB — ACETAMINOPHEN LEVEL

## 2017-11-15 LAB — LIPASE, BLOOD: Lipase: 25 U/L (ref 11–51)

## 2017-11-15 MED ORDER — HALOPERIDOL 5 MG PO TABS: 10.0000 mg | ORAL_TABLET | Freq: Once | ORAL | Status: DC

## 2017-11-15 MED ORDER — AMLODIPINE BESYLATE 5 MG PO TABS
5.0000 mg | ORAL_TABLET | Freq: Once | ORAL | Status: AC
  Administered 2017-11-15: 5 mg via ORAL
  Filled 2017-11-15: qty 1

## 2017-11-15 MED ORDER — HALOPERIDOL 5 MG PO TABS: 5.0000 mg | ORAL_TABLET | Freq: Once | ORAL | Status: DC

## 2017-11-15 MED ORDER — HYDRALAZINE HCL 20 MG/ML IJ SOLN
5.0000 mg | INTRAMUSCULAR | Status: DC | PRN
  Administered 2017-11-15 – 2017-11-16 (×2): 5 mg via INTRAVENOUS
  Filled 2017-11-15 (×2): qty 1

## 2017-11-15 MED ORDER — SODIUM CHLORIDE 0.9 % IV BOLUS
500.0000 mL | Freq: Once | INTRAVENOUS | Status: AC
  Administered 2017-11-15: 500 mL via INTRAVENOUS

## 2017-11-15 NOTE — ED Notes (Signed)
Pt stated she did not feel comfortable ambulating. Will attempt again at another time.

## 2017-11-15 NOTE — ED Provider Notes (Signed)
[facility] EMERGENCY DEPARTMENT Provider Note   CSN: [id number] Arrival date & time: 11/15/17  1449     History   Chief Complaint Chief Complaint  Patient presents with  . Dizziness    Weakness/Diarrhea    HPI [name] is a 76 y.o. female.  Patient is a 77 year old female who presents with generalized weakness.  She states that she was feeling fine this morning and then around lunchtime went to the bathroom and had a large amount of watery diarrhea.  She did not see any blood but she says she did not really look at it.  Since that time she felt overall weak and felt lightheaded.  She denies a sensation of room spinning.  She says that she gets lightheaded when she tries to stand up.  She does not really have any dizziness when she is lying down.  She denies any ongoing diarrhea.  No nausea or vomiting.  No associated abdominal pain.  No chest pain or shortness of breath.  No urinary symptoms.     Past Medical History:  Diagnosis Date  . Allergic rhinitis   . Anemia, iron deficiency   . Anxiety   . Arthritis    back  . CAD (coronary artery disease)    CAD stent placed 2005  . Cancer ([place])    Left breast invasive ductal carcinoma  . Dementia   . Esophageal motility disorder   . GERD (gastroesophageal reflux disease)   . HTN (hypertension)   . Hyperlipidemia   . IBS (irritable bowel syndrome)   . Peripheral neuropathy   . Pneumonia 12-09-2014   adm with CAP requiring mechanical ventilation x 10d  . PVD (peripheral vascular disease) ([facility])    Left subclavian 70% stenosis  . Renal insufficiency   . Schizophrenia ([place])    paranoid schizophrenic  . Syncope    EP study 1996 w/atrial tachycardia  . Type II or unspecified type diabetes mellitus without mention of complication, not stated as uncontrolled     Patient Active Problem List   Diagnosis Date Noted  . Septic shock ([place]) 11/02/2015  . Acute blood loss anemia 11/02/2015  . Acute kidney  injury ([place]) 11/02/2015  . Upper GI bleed 11/01/2015  . GI bleed 11/01/2015  . Breast cancer of upper-outer quadrant of left female breast ([place]) 08/29/2015  . Hypernatremia 12/23/2014  . Hypokalemia 12/23/2014  . Sepsis ([place]) 12/23/2014  . Anemia 12/23/2014  . Acute respiratory failure with hypoxemia ([place])   . Acute respiratory failure with hypoxia ([place]) 12/10/2014  . ARDS (adult respiratory distress syndrome) ([place]) 12/10/2014  . Acute encephalopathy   . Blood poisoning   . CAP (community acquired pneumonia) 12/09/2014  . DYSPHAGIA, PHARYNGOESOPHAGEAL PHASE 02/07/2009  . RENAL INSUFFICIENCY 02/05/2009  . TOBACCO USE, QUIT 02/03/2009  . VERTIGO 06/22/2008  . BACK PAIN 10/26/2007  . HYPERLIPIDEMIA 06/02/2007  . Dementia 06/02/2007  . Schizophrenia, unspecified type ([place]) 06/02/2007  . PERIPHERAL VASCULAR DISEASE 06/02/2007  . ALLERGIC RHINITIS 06/02/2007  . IBS 06/02/2007  . FATIGUE 06/02/2007  . PERIPHERAL EDEMA 06/02/2007  . Diabetes mellitus, type 2 ([place]) 09/04/2006  . ANEMIA-IRON DEFICIENCY 09/04/2006  . ANXIETY 09/04/2006  . PERIPHERAL NEUROPATHY 09/04/2006  . Essential hypertension 09/04/2006  . CORONARY ARTERY DISEASE 09/04/2006  . GERD 09/04/2006  . HX, PERSONAL, SCHIZOPHRENIA 09/04/2006    Past Surgical History:  Procedure Laterality Date  . ABDOMINAL HYSTERECTOMY    . BREAST BIOPSY     Negative  . ESOPHAGOGASTRODUODENOSCOPY N/A  11/02/2015   Procedure: ESOPHAGOGASTRODUODENOSCOPY (EGD);  Surgeon: Laurence Spates, MD;  Location: [facility];  Service: Endoscopy;  Laterality: N/A;  . JOINT REPLACEMENT Bilateral   . KNEE ARTHROPLASTY     Right   . PTCA  1995  . RADIOACTIVE SEED GUIDED PARTIAL MASTECTOMY WITH AXILLARY SENTINEL LYMPH NODE BIOPSY Left 08/17/2015   Procedure: RADIOACTIVE SEED GUIDED LUMPECTOMY WITH AXILLARY SENTINEL LYMPH NODE BIOPSY;  Surgeon: Donnie Mesa, MD;  Location: [facility];  Service: General;  Laterality: Left;  . RE-EXCISION  OF BREAST LUMPECTOMY Left 09/07/2015   Procedure: RE-EXCISION OF LEFT BREAST LUMPECTOMY;  Surgeon: Donnie Mesa, MD;  Location: [facility];  Service: General;  Laterality: Left;     OB History   None      Home Medications    Prior to Admission medications   Medication Sig Start Date End Date Taking? Authorizing Provider  acetaminophen (TYLENOL) 500 MG tablet Take 500-1,000 mg by mouth 3 (three) times daily as needed. For back pain     [provider]  amLODipine (NORVASC) 5 MG tablet Take 5 mg by mouth daily.    [provider]  atorvastatin (LIPITOR) 40 MG tablet Take 40 mg by mouth daily.    [provider]  azithromycin (ZITHROMAX) 250 MG tablet Take 1 tablet (250 mg total) by mouth daily. Take first 2 tablets together, then 1 every day until finished. 07/19/17   Vanessa Kick, MD  cloZAPine (CLOZARIL) 100 MG tablet Take 1 tablet (100 mg total) by mouth at bedtime. Patient taking differently: Take 150 mg by mouth at bedtime.  12/27/14   Maryellen Pile, MD  donepezil (ARICEPT) 10 MG tablet Take 10 mg by mouth at bedtime.    [provider]  feeding supplement, GLUCERNA SHAKE, (GLUCERNA SHAKE) LIQD Take 237 mLs by mouth 3 (three) times daily between meals. Patient taking differently: Take 237 mLs by mouth daily as needed (poor appetite).  11/09/15   [place], Clanford L, MD  furosemide (LASIX) 40 MG tablet Take 20 mg by mouth daily.     [provider]  haloperidol (HALDOL) 5 MG tablet Take 5-10 mg by mouth See admin instructions. Take 1 in the morning and 2 at night     [provider]  HYDROcodone-acetaminophen (NORCO/VICODIN) 5-325 MG tablet Take 1-2 tablets by mouth every 6 (six) hours as needed for moderate pain. 01/22/16   Lajean Saver, MD  insulin glargine (LANTUS) 100 UNIT/ML injection Inject 15 Units into the skin at bedtime.    [provider]  levocetirizine (XYZAL) 5 MG tablet Take 5 mg by mouth every  evening.    [provider]  memantine (NAMENDA) 10 MG tablet Take 1 tablet (10 mg total) by mouth 2 (two) times daily. 10/09/12   Norins, Heinz Knuckles, MD  mirtazapine (REMERON) 30 MG tablet Take 30 mg by mouth at bedtime.    [provider]  omeprazole (PRILOSEC) 40 MG capsule Take 40 mg by mouth daily.    [provider]  pantoprazole (PROTONIX) 40 MG tablet Take 40 mg by mouth daily.    [provider]  potassium chloride (KLOR-CON) 8 MEQ tablet Take 1 tablet (8 mEq total) by mouth daily. 04/28/12   Norins, Heinz Knuckles, MD  ramipril (ALTACE) 10 MG capsule Take 10 mg by mouth daily.    [provider]  saccharomyces boulardii (FLORASTOR) 250 MG capsule Take 1 capsule (250 mg total) by mouth 2 (two) times daily. 11/09/15  [place], Clanford L, MD  traZODone (DESYREL) 100 MG tablet Take 100 mg by mouth at bedtime as needed for sleep (insomnia).    [provider]  valsartan (DIOVAN) 160 MG tablet Take 160 mg by mouth daily.    [provider]    Family History Family History  Problem Relation Age of Onset  . Hypertension Other   . Diabetes Other   . Schizophrenia Neg Hx   . Coronary artery disease Neg Hx   . Colon cancer Neg Hx   . Breast cancer Neg Hx     Social History Social History   Tobacco Use  . Smoking status: Former Research scientist (life sciences)  . Smokeless tobacco: Never Used  Substance Use Topics  . Alcohol use: No  . Drug use: No     Allergies   Codeine phosphate; Glimepiride; Glyburide; Levofloxacin; Shellfish allergy; and Tramadol   Review of Systems Review of Systems  Constitutional: Positive for fatigue. Negative for chills, diaphoresis and fever.  HENT: Negative for congestion, rhinorrhea and sneezing.   Eyes: Negative.   Respiratory: Negative for cough, chest tightness and shortness of breath.   Cardiovascular: Negative for chest pain and leg swelling.  Gastrointestinal: Positive for diarrhea. Negative for abdominal  pain, blood in stool, nausea and vomiting.  Genitourinary: Negative for difficulty urinating, flank pain, frequency and hematuria.  Musculoskeletal: Negative for arthralgias and back pain.  Skin: Negative for rash.  Neurological: Positive for dizziness, weakness (Generalized) and light-headedness. Negative for speech difficulty, numbness and headaches.     Physical Exam Updated Vital Signs BP (!) 184/86   Pulse 92   Temp 98.1 F (36.7 C)   Resp 20   SpO2 99%   Physical Exam  Constitutional: She is oriented to person, place, and time. She appears well-developed and well-nourished.  HENT:  Head: Normocephalic and atraumatic.  Eyes: Pupils are equal, round, and reactive to light.  No nystagmus  Neck: Normal range of motion. Neck supple.  Cardiovascular: Normal rate, regular rhythm and normal heart sounds.  Pulmonary/Chest: Effort normal and breath sounds normal. No respiratory distress. She has no wheezes. She has no rales. She exhibits no tenderness.  Abdominal: Soft. Bowel sounds are normal. There is no tenderness. There is no rebound and no guarding.  Musculoskeletal: Normal range of motion. She exhibits no edema.  Lymphadenopathy:    She has no cervical adenopathy.  Neurological: She is alert and oriented to person, place, and time.  Motor 5/5 all extremities Sensation grossly intact to LT all extremities Finger to Nose intact, no pronator drift CN II-XII grossly intact Gait normal   Skin: Skin is warm and dry. No rash noted.  Psychiatric: She has a normal mood and affect.     ED Treatments / Results  Labs (all labs ordered are listed, but only abnormal results are displayed) Labs Reviewed  COMPREHENSIVE METABOLIC PANEL - Abnormal; Notable for the following components:      Result Value   Glucose, Bld 111 (*)    GFR calc non Af Amer 57 (*)    All other components within normal limits  URINALYSIS, ROUTINE W REFLEX MICROSCOPIC - Abnormal; Notable for the following  components:   APPearance HAZY (*)    All other components within normal limits  CBG MONITORING, ED - Abnormal; Notable for the following components:   Glucose-Capillary 104 (*)    All other components within normal limits  LIPASE, BLOOD  CBC WITH DIFFERENTIAL/PLATELET  ACETAMINOPHEN LEVEL    EKG EKG Interpretation  Date/Time:  Saturday November 15 2017 15:09:43 EDT Ventricular Rate:  82 PR Interval:  126 QRS Duration: 78 QT Interval:  384 QTC Calculation: 448 R Axis:   13 Text Interpretation:  Normal sinus rhythm Normal ECG since last tracing no significant change Confirmed by Malvin Johns [phone]) on 11/15/2017 3:58:39 PM Also confirmed by Malvin Johns [phone]), editor Philomena Doheny [phone])  on 11/15/2017 4:49:15 PM   Radiology Ct Head Wo Contrast  Result Date: 11/15/2017 CLINICAL DATA:  Altered level of consciousness EXAM: CT HEAD WITHOUT CONTRAST TECHNIQUE: Contiguous axial images were obtained from the base of the skull through the vertex without intravenous contrast. COMPARISON:  11/01/2015 FINDINGS: Brain: Age related volume loss. No acute intracranial abnormality. Specifically, no hemorrhage, hydrocephalus, mass lesion, acute infarction, or significant intracranial injury. Vascular: No hyperdense vessel or unexpected calcification. Skull: No acute calvarial abnormality. Sinuses/Orbits: Visualized paranasal sinuses and mastoids clear. Orbital soft tissues unremarkable. Other: None IMPRESSION: No acute intracranial abnormality. Electronically Signed   By: Rolm Baptise M.D.   On: 11/15/2017 20:56    Procedures Procedures (including critical care time)  Medications Ordered in ED Medications  haloperidol (HALDOL) tablet 10 mg (has no administration in time range)  sodium chloride 0.9 % bolus 500 mL (0 mLs Intravenous Stopped 11/15/17 1819)  amLODipine (NORVASC) tablet 5 mg (5 mg Oral Given 11/15/17 2135)     Initial Impression / Assessment and Plan / ED Course  I have reviewed  the triage vital signs and the nursing notes.  Pertinent labs & imaging results that were available during my care of the patient were reviewed by me and considered in my medical decision making (see chart for details).     Patient is a 77 year old female with a history of mild dementia and schizophrenia who presents after an episode of diarrhea.  Her labs are non-concerning.  Her abdomen is nontender.  She has no evidence of a UTI.  When I went back to reexamine her, her family is in the room and states that she has been more confused.  She does have a history of schizophrenia and they are not sure if she has been taking her medications as directed.  She has recently started an over-the-counter medicine for her chronic back pain which is called Backaid.  This has Tylenol and a diuretic.  She has not started any other new medicines.  She did have a head CT today which shows no acute abnormalities.  There is no focal neurologic deficits.  She was given a dose of Haldol in the ED.  I spoke with Dr. Hal Hope who will admit the patient for observation.  Final Clinical Impressions(s) / ED Diagnoses   Final diagnoses:  Altered mental status, unspecified altered mental status type    ED Discharge Orders    None       Malvin Johns, MD 11/15/17 2155

## 2017-11-15 NOTE — ED Triage Notes (Signed)
Pt arrived via [facility] with c/o dizziness, weakness, and diarrhea this am with decreased appetite past few days. Pt from home; hx of DM, HTN; 170/100; 108; 90; 97% on RA

## 2017-11-16 ENCOUNTER — Inpatient Hospital Stay (HOSPITAL_COMMUNITY): Payer: Medicare Other

## 2017-11-16 ENCOUNTER — Encounter (HOSPITAL_COMMUNITY): Payer: Self-pay | Admitting: Internal Medicine

## 2017-11-16 DIAGNOSIS — R4182 Altered mental status, unspecified: Secondary | ICD-10-CM | POA: Diagnosis not present

## 2017-11-16 DIAGNOSIS — F0390 Unspecified dementia without behavioral disturbance: Secondary | ICD-10-CM | POA: Diagnosis present

## 2017-11-16 DIAGNOSIS — Z955 Presence of coronary angioplasty implant and graft: Secondary | ICD-10-CM | POA: Diagnosis not present

## 2017-11-16 DIAGNOSIS — Z888 Allergy status to other drugs, medicaments and biological substances status: Secondary | ICD-10-CM | POA: Diagnosis not present

## 2017-11-16 DIAGNOSIS — G9340 Encephalopathy, unspecified: Secondary | ICD-10-CM | POA: Diagnosis not present

## 2017-11-16 DIAGNOSIS — I110 Hypertensive heart disease with heart failure: Secondary | ICD-10-CM | POA: Diagnosis present

## 2017-11-16 DIAGNOSIS — R401 Stupor: Secondary | ICD-10-CM | POA: Diagnosis not present

## 2017-11-16 DIAGNOSIS — F419 Anxiety disorder, unspecified: Secondary | ICD-10-CM | POA: Diagnosis not present

## 2017-11-16 DIAGNOSIS — F200 Paranoid schizophrenia: Secondary | ICD-10-CM | POA: Diagnosis not present

## 2017-11-16 DIAGNOSIS — G9341 Metabolic encephalopathy: Secondary | ICD-10-CM | POA: Diagnosis present

## 2017-11-16 DIAGNOSIS — K580 Irritable bowel syndrome with diarrhea: Secondary | ICD-10-CM | POA: Diagnosis present

## 2017-11-16 DIAGNOSIS — C50412 Malignant neoplasm of upper-outer quadrant of left female breast: Secondary | ICD-10-CM | POA: Diagnosis not present

## 2017-11-16 DIAGNOSIS — Z8249 Family history of ischemic heart disease and other diseases of the circulatory system: Secondary | ICD-10-CM | POA: Diagnosis not present

## 2017-11-16 DIAGNOSIS — Z881 Allergy status to other antibiotic agents status: Secondary | ICD-10-CM | POA: Diagnosis not present

## 2017-11-16 DIAGNOSIS — Z885 Allergy status to narcotic agent status: Secondary | ICD-10-CM | POA: Diagnosis not present

## 2017-11-16 DIAGNOSIS — R509 Fever, unspecified: Secondary | ICD-10-CM | POA: Diagnosis present

## 2017-11-16 DIAGNOSIS — Z9119 Patient's noncompliance with other medical treatment and regimen: Secondary | ICD-10-CM | POA: Diagnosis not present

## 2017-11-16 DIAGNOSIS — Z87891 Personal history of nicotine dependence: Secondary | ICD-10-CM | POA: Diagnosis not present

## 2017-11-16 DIAGNOSIS — I160 Hypertensive urgency: Secondary | ICD-10-CM | POA: Diagnosis present

## 2017-11-16 DIAGNOSIS — E119 Type 2 diabetes mellitus without complications: Secondary | ICD-10-CM | POA: Diagnosis not present

## 2017-11-16 DIAGNOSIS — Z90710 Acquired absence of both cervix and uterus: Secondary | ICD-10-CM | POA: Diagnosis not present

## 2017-11-16 DIAGNOSIS — Z818 Family history of other mental and behavioral disorders: Secondary | ICD-10-CM | POA: Diagnosis not present

## 2017-11-16 DIAGNOSIS — E1151 Type 2 diabetes mellitus with diabetic peripheral angiopathy without gangrene: Secondary | ICD-10-CM | POA: Diagnosis present

## 2017-11-16 DIAGNOSIS — F209 Schizophrenia, unspecified: Secondary | ICD-10-CM | POA: Diagnosis not present

## 2017-11-16 DIAGNOSIS — G259 Extrapyramidal and movement disorder, unspecified: Secondary | ICD-10-CM | POA: Diagnosis not present

## 2017-11-16 DIAGNOSIS — I10 Essential (primary) hypertension: Secondary | ICD-10-CM | POA: Diagnosis not present

## 2017-11-16 DIAGNOSIS — Z79899 Other long term (current) drug therapy: Secondary | ICD-10-CM | POA: Diagnosis not present

## 2017-11-16 DIAGNOSIS — I509 Heart failure, unspecified: Secondary | ICD-10-CM | POA: Diagnosis present

## 2017-11-16 DIAGNOSIS — J811 Chronic pulmonary edema: Secondary | ICD-10-CM | POA: Diagnosis not present

## 2017-11-16 DIAGNOSIS — I5033 Acute on chronic diastolic (congestive) heart failure: Secondary | ICD-10-CM | POA: Diagnosis present

## 2017-11-16 DIAGNOSIS — G4700 Insomnia, unspecified: Secondary | ICD-10-CM | POA: Diagnosis not present

## 2017-11-16 DIAGNOSIS — R410 Disorientation, unspecified: Secondary | ICD-10-CM | POA: Diagnosis not present

## 2017-11-16 DIAGNOSIS — I2510 Atherosclerotic heart disease of native coronary artery without angina pectoris: Secondary | ICD-10-CM | POA: Diagnosis present

## 2017-11-16 DIAGNOSIS — I739 Peripheral vascular disease, unspecified: Secondary | ICD-10-CM | POA: Diagnosis not present

## 2017-11-16 DIAGNOSIS — Z794 Long term (current) use of insulin: Secondary | ICD-10-CM | POA: Diagnosis not present

## 2017-11-16 DIAGNOSIS — I313 Pericardial effusion (noninflammatory): Secondary | ICD-10-CM | POA: Diagnosis not present

## 2017-11-16 DIAGNOSIS — R443 Hallucinations, unspecified: Secondary | ICD-10-CM | POA: Diagnosis present

## 2017-11-16 DIAGNOSIS — Z96651 Presence of right artificial knee joint: Secondary | ICD-10-CM | POA: Diagnosis present

## 2017-11-16 DIAGNOSIS — M48061 Spinal stenosis, lumbar region without neurogenic claudication: Secondary | ICD-10-CM | POA: Diagnosis not present

## 2017-11-16 LAB — GLUCOSE, CAPILLARY
GLUCOSE-CAPILLARY: 94 mg/dL (ref 70–99)
GLUCOSE-CAPILLARY: 138 mg/dL — AB (ref 70–99)
GLUCOSE-CAPILLARY: 147 mg/dL — AB (ref 70–99)
GLUCOSE-CAPILLARY: 134 mg/dL — AB (ref 70–99)
Glucose-Capillary: 89 mg/dL (ref 70–99)

## 2017-11-16 LAB — MRSA PCR SCREENING: MRSA BY PCR: NEGATIVE

## 2017-11-16 MED ORDER — ACETAMINOPHEN 650 MG RE SUPP: 650.0000 mg | Freq: Four times a day (QID) | RECTAL | Status: DC | PRN

## 2017-11-16 MED ORDER — ONDANSETRON HCL 4 MG/2ML IJ SOLN: 4.0000 mg | Freq: Four times a day (QID) | INTRAMUSCULAR | Status: DC | PRN

## 2017-11-16 MED ORDER — ACETAMINOPHEN 325 MG PO TABS
650.0000 mg | ORAL_TABLET | Freq: Four times a day (QID) | ORAL | Status: DC | PRN
  Administered 2017-11-19: 650 mg via ORAL
  Filled 2017-11-16: qty 2

## 2017-11-16 MED ORDER — HALOPERIDOL LACTATE 5 MG/ML IJ SOLN
0.5000 mg | Freq: Four times a day (QID) | INTRAMUSCULAR | Status: DC | PRN
  Administered 2017-11-16: 0.5 mg via INTRAVENOUS
  Filled 2017-11-16: qty 1

## 2017-11-16 MED ORDER — SODIUM CHLORIDE 0.9 % IV SOLN
INTRAVENOUS | Status: DC
  Administered 2017-11-16: 15:00:00 via INTRAVENOUS

## 2017-11-16 MED ORDER — IRBESARTAN 150 MG PO TABS
150.0000 mg | ORAL_TABLET | Freq: Every day | ORAL | Status: DC
  Administered 2017-11-16: 150 mg via ORAL
  Filled 2017-11-16 (×3): qty 1

## 2017-11-16 MED ORDER — INSULIN ASPART 100 UNIT/ML ~~LOC~~ SOLN
0.0000 [IU] | Freq: Three times a day (TID) | SUBCUTANEOUS | Status: DC
  Administered 2017-11-17 (×3): 1 [IU] via SUBCUTANEOUS
  Administered 2017-11-18: 2 [IU] via SUBCUTANEOUS
  Administered 2017-11-18: 1 [IU] via SUBCUTANEOUS
  Administered 2017-11-19: 2 [IU] via SUBCUTANEOUS
  Administered 2017-11-19 – 2017-11-20 (×2): 3 [IU] via SUBCUTANEOUS
  Administered 2017-11-20 (×2): 1 [IU] via SUBCUTANEOUS

## 2017-11-16 MED ORDER — RAMIPRIL 10 MG PO CAPS
10.0000 mg | ORAL_CAPSULE | Freq: Every day | ORAL | Status: DC
  Administered 2017-11-16: 10 mg via ORAL
  Filled 2017-11-16: qty 1
  Filled 2017-11-16: qty 4
  Filled 2017-11-16: qty 1

## 2017-11-16 MED ORDER — ONDANSETRON HCL 4 MG PO TABS: 4.0000 mg | ORAL_TABLET | Freq: Four times a day (QID) | ORAL | Status: DC | PRN

## 2017-11-16 MED ORDER — HYDRALAZINE HCL 20 MG/ML IJ SOLN
10.0000 mg | INTRAMUSCULAR | Status: DC | PRN
  Administered 2017-11-16 (×2): 10 mg via INTRAVENOUS
  Filled 2017-11-16 (×2): qty 1

## 2017-11-16 MED ORDER — ATORVASTATIN CALCIUM 40 MG PO TABS
40.0000 mg | ORAL_TABLET | Freq: Every day | ORAL | Status: DC
  Filled 2017-11-16: qty 1

## 2017-11-16 MED ORDER — AMLODIPINE BESYLATE 5 MG PO TABS
5.0000 mg | ORAL_TABLET | Freq: Every day | ORAL | Status: DC
  Administered 2017-11-16: 5 mg via ORAL
  Filled 2017-11-16 (×2): qty 1

## 2017-11-16 MED ORDER — ENOXAPARIN SODIUM 40 MG/0.4ML ~~LOC~~ SOLN
40.0000 mg | SUBCUTANEOUS | Status: DC
  Administered 2017-11-16 – 2017-11-17 (×2): 40 mg via SUBCUTANEOUS
  Filled 2017-11-16 (×2): qty 0.4

## 2017-11-16 MED ORDER — TRAZODONE HCL 100 MG PO TABS: 100.0000 mg | ORAL_TABLET | Freq: Every evening | ORAL | Status: DC | PRN

## 2017-11-16 NOTE — Progress Notes (Signed)
Received pt from ED. VS taken. Tele on .BP elevated. Unable to obtain nursing history. Pt is confused

## 2017-11-16 NOTE — H&P (Signed)
History and Physical    [name] KDX:[id number] DOB: [birth year] DOA: 11/15/2017  PCP: [name], MD  Patient coming from: Home.  History obtained from ER physician who has discussed with patient's daughter over the phone.  Chief Complaint: Dizziness confusion.  HPI: [name] is a 77 y.o. female with history of schizophrenia, hypertension, dementia, diabetes mellitus type 2 was brought to the ER after patient was complaining of dizziness and generalized weakness since yesterday morning.  Patient also had a one episode of diarrhea.  Denies any chest pain or shortness of breath or any nausea vomiting or productive cough.  Patient's daughter also states that patient has been having hallucinations with change in mental status since morning.  ED Course: In the ER patient appears generally weak and CT head was unremarkable.  Patient was afebrile and chest x-ray and UA was unremarkable.  Patient's nurse also noted that patient has benign.  So the patient was appearing confused and talking unrelated things.  Patient admitted for further observation.  Patient blood pressure is markedly elevated.  Was given amlodipine in the ER.  Review of Systems: As per HPI, rest all negative.   Past Medical History:  Diagnosis Date  . Allergic rhinitis   . Anemia, iron deficiency   . Anxiety   . Arthritis    back  . CAD (coronary artery disease)    CAD stent placed 2005  . Cancer ([place])    Left breast invasive ductal carcinoma  . Dementia   . Esophageal motility disorder   . GERD (gastroesophageal reflux disease)   . HTN (hypertension)   . Hyperlipidemia   . IBS (irritable bowel syndrome)   . Peripheral neuropathy   . Pneumonia 12-09-2014   adm with CAP requiring mechanical ventilation x 10d  . PVD (peripheral vascular disease) (HCC)    Left subclavian 70% stenosis  . Renal insufficiency   . Schizophrenia ([place])    paranoid schizophrenic  . Syncope    EP study 1996 w/atrial  tachycardia  . Type II or unspecified type diabetes mellitus without mention of complication, not stated as uncontrolled     Past Surgical History:  Procedure Laterality Date  . ABDOMINAL HYSTERECTOMY    . BREAST BIOPSY     Negative  . ESOPHAGOGASTRODUODENOSCOPY N/A 11/02/2015   Procedure: ESOPHAGOGASTRODUODENOSCOPY (EGD);  Surgeon: Laurence Spates, MD;  Location: [facility];  Service: Endoscopy;  Laterality: N/A;  . JOINT REPLACEMENT Bilateral   . KNEE ARTHROPLASTY     Right   . PTCA  1995  . RADIOACTIVE SEED GUIDED PARTIAL MASTECTOMY WITH AXILLARY SENTINEL LYMPH NODE BIOPSY Left 08/17/2015   Procedure: RADIOACTIVE SEED GUIDED LUMPECTOMY WITH AXILLARY SENTINEL LYMPH NODE BIOPSY;  Surgeon: Donnie Mesa, MD;  Location: [facility];  Service: General;  Laterality: Left;  . RE-EXCISION OF BREAST LUMPECTOMY Left 09/07/2015   Procedure: RE-EXCISION OF LEFT BREAST LUMPECTOMY;  Surgeon: Donnie Mesa, MD;  Location: [facility];  Service: General;  Laterality: Left;     reports that she has quit smoking. She has never used smokeless tobacco. She reports that she does not drink alcohol or use drugs.  Allergies  Allergen Reactions  . Codeine Phosphate Other (See Comments)    REACTION: unspecified  . Glimepiride Other (See Comments)    REACTION: unspecified  . Glyburide Other (See Comments)    REACTION: unspecified  . Levofloxacin Nausea Only    Made her weak and nauseated  . Shellfish Allergy Swelling  . Tramadol  Other (See Comments)    unknown    Family History  Problem Relation Age of Onset  . Hypertension Other   . Diabetes Other   . Schizophrenia Neg Hx   . Coronary artery disease Neg Hx   . Colon cancer Neg Hx   . Breast cancer Neg Hx     Prior to Admission medications   Medication Sig Start Date End Date Taking? Authorizing Provider  acetaminophen (TYLENOL) 500 MG tablet Take 500-1,000 mg by mouth 3 (three) times daily as needed. For back pain      [provider]  amLODipine (NORVASC) 5 MG tablet Take 5 mg by mouth daily.    [provider]  atorvastatin (LIPITOR) 40 MG tablet Take 40 mg by mouth daily.    [provider]  azithromycin (ZITHROMAX) 250 MG tablet Take 1 tablet (250 mg total) by mouth daily. Take first 2 tablets together, then 1 every day until finished. 07/19/17   Vanessa Kick, MD  cloZAPine (CLOZARIL) 100 MG tablet Take 1 tablet (100 mg total) by mouth at bedtime. Patient taking differently: Take 150 mg by mouth at bedtime.  12/27/14   Maryellen Pile, MD  donepezil (ARICEPT) 10 MG tablet Take 10 mg by mouth at bedtime.    [provider]  feeding supplement, GLUCERNA SHAKE, (GLUCERNA SHAKE) LIQD Take 237 mLs by mouth 3 (three) times daily between meals. Patient taking differently: Take 237 mLs by mouth daily as needed (poor appetite).  11/09/15   Johnson, Clanford L, MD  furosemide (LASIX) 40 MG tablet Take 20 mg by mouth daily.     [provider]  haloperidol (HALDOL) 5 MG tablet Take 5-10 mg by mouth See admin instructions. Take 1 in the morning and 2 at night     [provider]  HYDROcodone-acetaminophen (NORCO/VICODIN) 5-325 MG tablet Take 1-2 tablets by mouth every 6 (six) hours as needed for moderate pain. 01/22/16   Lajean Saver, MD  insulin glargine (LANTUS) 100 UNIT/ML injection Inject 15 Units into the skin at bedtime.    [provider]  levocetirizine (XYZAL) 5 MG tablet Take 5 mg by mouth every evening.    [provider]  memantine (NAMENDA) 10 MG tablet Take 1 tablet (10 mg total) by mouth 2 (two) times daily. 10/09/12   Norins, Heinz Knuckles, MD  mirtazapine (REMERON) 30 MG tablet Take 30 mg by mouth at bedtime.    [provider]  omeprazole (PRILOSEC) 40 MG capsule Take 40 mg by mouth daily.    [provider]  pantoprazole (PROTONIX) 40 MG tablet Take 40 mg by mouth daily.    [provider]  potassium chloride  (KLOR-CON) 8 MEQ tablet Take 1 tablet (8 mEq total) by mouth daily. 04/28/12   Norins, Heinz Knuckles, MD  ramipril (ALTACE) 10 MG capsule Take 10 mg by mouth daily.    [provider]  saccharomyces boulardii (FLORASTOR) 250 MG capsule Take 1 capsule (250 mg total) by mouth 2 (two) times daily. 11/09/15   Johnson, Clanford L, MD  traZODone (DESYREL) 100 MG tablet Take 100 mg by mouth at bedtime as needed for sleep (insomnia).    [provider]  valsartan (DIOVAN) 160 MG tablet Take 160 mg by mouth daily.    [provider]    Physical Exam: Vitals:   11/15/17 2330 11/15/17 2340 11/15/17 2345 11/16/17 0015  BP: (!) 206/95 (!) 196/95 (!) 198/98 (!) 185/101  Pulse: (!) 104 (!) 104  Marland Kitchen)  107  Resp: (!) 24 18  16   Temp:    (!) 97.5 F (36.4 C)  TempSrc:    Oral  SpO2: 96% 100% 99% 95%  Weight:    64.9 kg (143 lb)  Height:    5\' 4"  (1.626 m)      Constitutional: Moderately built and nourished. Vitals:   11/15/17 2330 11/15/17 2340 11/15/17 2345 11/16/17 0015  BP: (!) 206/95 (!) 196/95 (!) 198/98 (!) 185/101  Pulse: (!) 104 (!) 104  (!) 107  Resp: (!) 24 18  16   Temp:    (!) 97.5 F (36.4 C)  TempSrc:    Oral  SpO2: 96% 100% 99% 95%  Weight:    64.9 kg (143 lb)  Height:    5\' 4"  (1.626 m)   Eyes: Anicteric no pallor. ENMT: No discharge from the ears eyes nose or mouth. Neck: No mass felt.  No neck rigidity.  No JVD appreciated. Respiratory: No rhonchi or crepitations. Cardiovascular: S1-S2 heard no murmurs appreciated. Abdomen: Soft nontender bowel sounds present. Musculoskeletal: No edema.  No joint effusion. Skin: No rash.  Skin appears warm. Neurologic: Alert awake oriented to time place and person.  Moves all extremities 5 x 5 but appears generally weak. Psychiatric: Appears normal per normal affect.   Labs on Admission: I have personally reviewed following labs and imaging studies  CBC: Recent Labs  Lab 11/15/17 1601  WBC 9.1  NEUTROABS 6.2    HGB 12.2  HCT 39.9  MCV 96.6  PLT 161   Basic Metabolic Panel: Recent Labs  Lab 11/15/17 1601  NA 145  K 3.8  CL 111  CO2 25  GLUCOSE 111*  BUN 11  CREATININE 0.94  CALCIUM 9.6   GFR: Estimated Creatinine Clearance: 44 mL/min (by C-G formula based on SCr of 0.94 mg/dL). Liver Function Tests: Recent Labs  Lab 11/15/17 1601  AST 24  ALT 26  ALKPHOS 90  BILITOT 0.5  PROT 7.6  ALBUMIN 3.7   Recent Labs  Lab 11/15/17 1601  LIPASE 25   No results for input(s): AMMONIA in the last 168 hours. Coagulation Profile: No results for input(s): INR, PROTIME in the last 168 hours. Cardiac Enzymes: No results for input(s): CKTOTAL, CKMB, CKMBINDEX, TROPONINI in the last 168 hours. BNP (last 3 results) No results for input(s): PROBNP in the last 8760 hours. HbA1C: No results for input(s): HGBA1C in the last 72 hours. CBG: Recent Labs  Lab 11/15/17 1515  GLUCAP 104*   Lipid Profile: No results for input(s): CHOL, HDL, LDLCALC, TRIG, CHOLHDL, LDLDIRECT in the last 72 hours. Thyroid Function Tests: No results for input(s): TSH, T4TOTAL, FREET4, T3FREE, THYROIDAB in the last 72 hours. Anemia Panel: No results for input(s): VITAMINB12, FOLATE, FERRITIN, TIBC, IRON, RETICCTPCT in the last 72 hours. Urine analysis:    Component Value Date/Time   COLORURINE YELLOW 11/15/2017 1820   APPEARANCEUR HAZY (A) 11/15/2017 1820   LABSPEC 1.010 11/15/2017 1820   PHURINE 8.0 11/15/2017 1820   GLUCOSEU NEGATIVE 11/15/2017 1820   GLUCOSEU NEGATIVE 06/22/2008 0957   HGBUR NEGATIVE 11/15/2017 1820   HGBUR negative 12/12/2009 1102   BILIRUBINUR NEGATIVE 11/15/2017 1820   BILIRUBINUR small 11/09/2010 [place] 11/15/2017 1820   PROTEINUR NEGATIVE 11/15/2017 1820   UROBILINOGEN 1.0 12/12/2014 1100   NITRITE NEGATIVE 11/15/2017 1820   LEUKOCYTESUR NEGATIVE 11/15/2017 1820   Sepsis Labs: @LABRCNTIP (procalcitonin:4,lacticidven:4) )No results found for this or any  previous visit (from the past 240 hour(s)).  Radiological Exams on Admission: Ct Head Wo Contrast  Result Date: 11/15/2017 CLINICAL DATA:  Altered level of consciousness EXAM: CT HEAD WITHOUT CONTRAST TECHNIQUE: Contiguous axial images were obtained from the base of the skull through the vertex without intravenous contrast. COMPARISON:  11/01/2015 FINDINGS: Brain: Age related volume loss. No acute intracranial abnormality. Specifically, no hemorrhage, hydrocephalus, mass lesion, acute infarction, or significant intracranial injury. Vascular: No hyperdense vessel or unexpected calcification. Skull: No acute calvarial abnormality. Sinuses/Orbits: Visualized paranasal sinuses and mastoids clear. Orbital soft tissues unremarkable. Other: None IMPRESSION: No acute intracranial abnormality. Electronically Signed   By: Rolm Baptise M.D.   On: 11/15/2017 20:56    EKG: Independently reviewed.  Normal sinus rhythm.  Assessment/Plan Principal Problem:   Acute encephalopathy Active Problems:   Diabetes mellitus, type 2 (HCC)   Schizophrenia, unspecified type ([place])   Essential hypertension   PERIPHERAL VASCULAR DISEASE   Breast cancer of upper-outer quadrant of left female breast ([place])    1. Acute encephalopathy with hallucinations -could be related to patient's schizophrenia.  But family states that this is something new and so we will check ammonia levels and MRI brain.  Patient blood pressure is also markedly elevated which could be contributing. 2. Hypertensive urgency on amlodipine ARB and lisinopril.  Will keep patient on PRN IV hydralazine.  We will gently try to reduce blood pressure and avoid drastic drops. 3. Dizziness -check orthostatics in the morning get physical therapy consult.  Follow MRI brain. 4. Diabetes mellitus type 2 -for now we will keep patient on sliding scale coverage.  Home dose of insulin not clear. 5. History of dementia and schizophrenia -Home medication has to be verified.   See #1.  May need psychiatric consult if patient continues to have hallucination and if MRI is brain is negative.  Patient's home medications has to be verified.  For now I have not ordered medications in the medication list since it was not updated.  Only continue with patient's antihypertensives.  Patient's dementia medication and schizophrenia medication and diabetic medications has to be continued after verifying once list available and family available.  DVT prophylaxis: Lovenox. Code Status: Full code. Family Communication: No family at the bedside. Disposition Plan: Home. Consults called: None. Admission status: Observation.   Rise Patience MD [facility] Pager [phone].  If 7PM-7AM, please contact night-coverage [email or web address] Password [email or web address]  11/16/2017, 1:25 AM

## 2017-11-16 NOTE — Progress Notes (Signed)
Tried to get patient to take medication.   Patient refused.   Family show up at bedside-  Per Dr. Marthenia Rolling- can try to let family get her to take medications.   Family managed to get her to open her mouth, but she would never swallow them.   I continued to try to get her to follow it with some water, pudding and some applesauce.  Patient locked her jaw- would not let me get in with a yonker or a mouth swab.   Finally managed to get patient to gradually drink some apple juice, but this was only in small sips After about 1 hour- I did get to see inside mouth while she was talking- no medications seen.  Told patient's family to keep head of bed elevated and monitor closely

## 2017-11-16 NOTE — Progress Notes (Addendum)
Patient's daughter reported patient just started on new medication this past week.  Reports patient took 1st dose of baclofen on Thursday.  Reported changes in patient on Saturday   Daughter does not want patient to take medication any more.  Patient is still not taking any medication by mouth.   Daughter concerned for pt not getting clozapine  Paged Doctor

## 2017-11-16 NOTE — Consult Note (Signed)
Patient is extremely confused, disoriented and unable to communicate. It is difficult to obtain information from patient due to acute encephalopathy. Recommendation: -Pls re-consult psych when patient is alert.  Jane Quist,MD

## 2017-11-16 NOTE — Progress Notes (Signed)
Admitted earlier today with hallucinations, acute encephalopathy, hypertensive urgency and noncompliance with medical management.  Will start patient on IV haloperidol 0.5 mg every 6 hourly as needed.  We will get a psychiatric consult.  We will continue to look for possible organic cause for acute encephalopathy.  Further management depend on hospital course.

## 2017-11-16 NOTE — ED Notes (Signed)
Report given to RN in [facility]east.

## 2017-11-17 ENCOUNTER — Encounter (HOSPITAL_COMMUNITY): Payer: Self-pay | Admitting: Nephrology

## 2017-11-17 ENCOUNTER — Inpatient Hospital Stay (HOSPITAL_COMMUNITY): Payer: Medicare Other

## 2017-11-17 DIAGNOSIS — R4182 Altered mental status, unspecified: Secondary | ICD-10-CM | POA: Diagnosis present

## 2017-11-17 DIAGNOSIS — R401 Stupor: Secondary | ICD-10-CM

## 2017-11-17 DIAGNOSIS — G259 Extrapyramidal and movement disorder, unspecified: Secondary | ICD-10-CM

## 2017-11-17 DIAGNOSIS — F209 Schizophrenia, unspecified: Secondary | ICD-10-CM

## 2017-11-17 LAB — GLUCOSE, CAPILLARY
GLUCOSE-CAPILLARY: 132 mg/dL — AB (ref 70–99)
GLUCOSE-CAPILLARY: 136 mg/dL — AB (ref 70–99)
Glucose-Capillary: 132 mg/dL — ABNORMAL HIGH (ref 70–99)
Glucose-Capillary: 124 mg/dL — ABNORMAL HIGH (ref 70–99)
Glucose-Capillary: 106 mg/dL — ABNORMAL HIGH (ref 70–99)

## 2017-11-17 MED ORDER — KCL IN DEXTROSE-NACL 20-5-0.45 MEQ/L-%-% IV SOLN: INTRAVENOUS | Status: DC

## 2017-11-17 MED ORDER — ENSURE ENLIVE PO LIQD: 237.0000 mL | Freq: Two times a day (BID) | ORAL | Status: DC

## 2017-11-17 MED ORDER — BENZTROPINE MESYLATE 1 MG/ML IJ SOLN
1.0000 mg | Freq: Two times a day (BID) | INTRAMUSCULAR | Status: DC
  Administered 2017-11-17 – 2017-11-21 (×8): 1 mg via INTRAVENOUS
  Filled 2017-11-17 (×8): qty 1

## 2017-11-17 MED ORDER — SODIUM CHLORIDE 0.45 % IV SOLN
INTRAVENOUS | Status: DC
  Administered 2017-11-17: 16:00:00 via INTRAVENOUS

## 2017-11-17 MED ORDER — AMLODIPINE BESYLATE 5 MG PO TABS: 5.0000 mg | ORAL_TABLET | Freq: Two times a day (BID) | ORAL | Status: DC

## 2017-11-17 MED ORDER — BENZTROPINE MESYLATE 1 MG/ML IJ SOLN
1.0000 mg | Freq: Two times a day (BID) | INTRAMUSCULAR | Status: DC
  Filled 2017-11-17: qty 1

## 2017-11-17 MED ORDER — GLUCERNA SHAKE PO LIQD
237.0000 mL | Freq: Three times a day (TID) | ORAL | Status: DC
  Administered 2017-11-18 – 2017-11-19 (×4): 237 mL via ORAL

## 2017-11-17 MED ORDER — CLONIDINE HCL 0.2 MG/24HR TD PTWK
0.2000 mg | MEDICATED_PATCH | TRANSDERMAL | Status: DC
  Administered 2017-11-17: 0.2 mg via TRANSDERMAL
  Filled 2017-11-17: qty 1

## 2017-11-17 MED ORDER — METOPROLOL TARTRATE 5 MG/5ML IV SOLN
5.0000 mg | Freq: Four times a day (QID) | INTRAVENOUS | Status: DC
  Administered 2017-11-17 – 2017-11-18 (×4): 5 mg via INTRAVENOUS
  Filled 2017-11-17 (×4): qty 5

## 2017-11-17 MED ORDER — HYDRALAZINE HCL 20 MG/ML IJ SOLN: 10.0000 mg | INTRAMUSCULAR | Status: DC | PRN

## 2017-11-17 NOTE — Progress Notes (Addendum)
Patient did not respond/open eyes to pain response, but did respond to movement/reposition.  Paged doctor- patient seems worse from yesterday.   Family brought product that they use in pt hair, also washed hair.

## 2017-11-17 NOTE — Progress Notes (Addendum)
RN accompanied patient down to MRI. MRI unable to be completed. Patient has a jerry curl with a gel like substance in her hair. Per MRI tech, we have to be able to verify substance to ensure there are no petroleum products due to petroleum products being flamable. RN will communicate to dayshift RN to get family to verify ingredients in hair product to ensure safety. Patient is in bed eyes open and starring off. Still no response to painful stimuli and continues to be non verbal and crying. Will continue to monitor patient.

## 2017-11-17 NOTE — Progress Notes (Addendum)
[facility] Progress Note  Subjective: has been doing better for RN, speaking some today  Vitals:   11/17/17 0031 11/17/17 0338 11/17/17 0522 11/17/17 1331  BP: (!) 154/69 (!) 177/64  (!) 178/77  Pulse: (!) 111 (!) 114  (!) 110  Resp:  20    Temp:  98.1 F (36.7 C)    TempSrc:  Oral    SpO2:  99%  99%  Weight:   65.4 kg (144 lb 2.9 oz)   Height:        Inpatient medications: . amLODipine  5 mg Oral Daily  . atorvastatin  40 mg Oral Daily  . insulin aspart  0-9 Units Subcutaneous TID WC  . irbesartan  150 mg Oral Daily   . sodium chloride     acetaminophen **OR** acetaminophen, haloperidol lactate, hydrALAZINE, ondansetron **OR** ondansetron (ZOFRAN) IV, traZODone  Exam:  much more alert and fluid, tremors / eps in the face and jaw are resolved  no jvd  chest cta bilat  cor reg no mrg  abd obese soft ntnd no ascites +bs  ext no edema LE's or UE's  follows simple commands, trying to speak but not real intelligible, moves ext x 4  Presentation Summary: [name] is a 77 y.o. female with history of schizophrenia, hypertension, dementia, diabetes mellitus type 2 was brought to the ER after patient was complaining of dizziness and generalized weakness since yesterday morning.  Patient also had a one episode of diarrhea.  Denies any chest pain or shortness of breath or any nausea vomiting or productive cough.  Patient's daughter also states that patient has been having hallucinations with change in mental status since morning.  ED Course: In the ER patient appears generally weak and CT head was unremarkable.  Patient was afebrile and chest x-ray and UA was unremarkable.  Patient's nurse also noted that patient has been appearing confused and talking unrelated things.  Patient admitted for further observation.  Patient blood pressure is markedly elevated.  Was given amlodipine in the ER.     Home meds:  - clozapine 200 mg hs/ haldol 5mg  am+ 10 mg pm  - mirtazapine 45 mg  hs  - neurontin 100 mg hs prn pain  - namenda 10 mg bid/ aricept 10 mg hs  - norvasc 10 qd/ lasix 40 qd/ valsartan 160 qd  - ppi/ zantac/ lipitor/ Kdur/ norco prn  - lantus 15 u hs     Hospital Course/ Plan:  1) Acute encephalopathy with hallucinations/ history of dementia and schizophrenia: daughter says at baseline pt is functional w/ mild memory deficits and lives alone w/ an aid or family helping daily - suspect major issue is EPS reaction to chronic psych meds; sig better today off haldol and on cogentin, still has some room for improvement though - continue Cogentin 1 mg bid IV / IM - will consult psychiatry again as patient is more alert and need help adjusting meds /when to restart (long hx schizophrenia and dementia as well) - 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 Acute, mild, no hx of CHF. Pt not in any distress - get echo and dc IVF's - start small dose IV lasix - po KCl    4) Diabetes mellitus type 2 -  - for now we will keep patient on sliding scale coverage, not eating much yet - is on lantus 15 u qhs at night, holding for now  5) Low grade fever : 99.2 overnight - UA on admit was negative - CXR w/o pna  - continue to monitor, nothing on exam to suggest acute infection  5) Hx breast Ca  6) CAD hx stent 2005   Kelly Splinter MD [facility] pgr [phone] 09/14/2017, 9:25 AM   Code Status: full DVT Prophylaxis: scd's Family Communication: talked w/ dtr on phone Disposition Plan: not sure yet Status: inpatient    Recent Labs  Lab 11/15/17 1601   NA 145  K 3.8  CL 111  CO2 25  GLUCOSE 111*  BUN 11  CREATININE 0.94  CALCIUM 9.6   Recent Labs  Lab 11/15/17 1601  AST 24  ALT 26  ALKPHOS 90  BILITOT 0.5  PROT 7.6  ALBUMIN 3.7   Recent Labs  Lab 11/15/17 1601  WBC 9.1  NEUTROABS 6.2  HGB 12.2  HCT 39.9  MCV 96.6  PLT 290   Iron/TIBC/Ferritin/ %Sat    Component Value Date/Time   IRON 7 (L) 11/01/2015 1236   TIBC 202 (L) 11/01/2015 1236   FERRITIN 96 11/01/2015 1236   IRONPCTSAT 3 (L) 11/01/2015 1236

## 2017-11-17 NOTE — Significant Event (Signed)
Rapid Response Event Note  Overview: AMS - Encephalopathy  Initial Focused Assessment:  Called by RN about being encephalopathic, patient was crying, has not been talking, facial twitching at times. Pupils are reactive, + gag/cough. Localizes to pain in all extremities. Not in acute distress, blood sugar was normal, hypertensive at times (treated with Hydralazine) and mild tachycardia 100s, RR and oxygen saturations are normal. Per RN, these findings are not new for her shift but this is not the patient's baseline. Temperature - afebrile. Hard to obtain neuro assessment because lack of cooperation from patient.  Interventions: - I spoke with [facility] NP, patient is prescribed several medications for schizophrenia, dementia, and neuropathy but RN is unsure if patient took them earlier (has been spitting them out or pocketing them in her mouth).  - RN to coordinate with MRI and have patient get MRI done. Patient is calm now.   Plan of Care: - MRI - neuro checks   Event Summary:    at     Call Time [name]  [name]

## 2017-11-17 NOTE — Progress Notes (Addendum)
Called patient daughter- need to bring bottle solution that is used on her hair to verify it does not have petroleum in it prior to performing MRI  Left message for her to bring to [place]

## 2017-11-17 NOTE — Progress Notes (Signed)
Initial Nutrition Assessment  DOCUMENTATION CODES:   Not applicable  INTERVENTION:   - Glucerna Shake po TID, each supplement provides 220 kcal and 10 grams of protein  - If pt remains lethargic and unresponsive and is unable to take PO nutrition, recommend placement of a Cortrak feeding tube and intiation of enteral nutrition. [facility] team is available on Monday, Wednesday, Friday, and Saturday from 0800 to 1600.  NUTRITION DIAGNOSIS:   Inadequate oral intake related to lethargy/confusion as evidenced by meal completion < 25%, per patient/family report.  GOAL:   Patient will meet greater than or equal to 90% of their needs  MONITOR:   PO intake, Supplement acceptance, Labs, I & O's, Weight trends  REASON FOR ASSESSMENT:   Low Braden    ASSESSMENT:   77 year old female who presented to the ED with dizziness, weakness, and diarrhea. PMH significant for dementia, schizophrenia, hypertension, hyperlipidemia, IBS, GERD, CAD, and type 2 diabetes mellitus.  Noted rapid response event this AM.  Discussed pt with RN who reports pt ate some of breakfast but none of lunch. RD noted untouched lunch meal tray in room at time of visit. MD leaving pt's room at time of RD visit.  No family present at time of visit so unable to obtain a detailed diet or weight history. Weight history in chart is limited as last weight recorded PTA was in August 2017.  Meal Completion: 0-10%  Medications reviewed and include: sliding scale Novolog  Labs reviewed. CBG's: 132, 124, 132, 134, 147 x 24 hours  UOP: 625 ml x 24 hours  NUTRITION - FOCUSED PHYSICAL EXAM:    Most Recent Value  Orbital Region  No depletion  Upper Arm Region  No depletion  Thoracic and Lumbar Region  No depletion  Buccal Region  No depletion  Temple Region  Mild depletion  Clavicle Bone Region  No depletion  Clavicle and Acromion Bone Region  No depletion  Scapular Bone Region  Unable to assess  Dorsal Hand  No  depletion  Patellar Region  No depletion  Anterior Thigh Region  Mild depletion  Posterior Calf Region  No depletion  Edema (RD Assessment)  Moderate  Hair  Reviewed  Eyes  Unable to assess  Mouth  Unable to assess  Skin  Reviewed  Nails  Reviewed       Diet Order:   Diet Order           Diet heart healthy/carb modified Room service appropriate? Yes; Fluid consistency: Thin  Diet effective now          EDUCATION NEEDS:   Not appropriate for education at this time  Skin:  Skin Assessment: Reviewed RN Assessment  Last BM:  11/17/17  Height:   Ht Readings from Last 1 Encounters:  11/16/17 5\' 4"  (1.626 m)    Weight:   Wt Readings from Last 1 Encounters:  11/17/17 144 lb 2.9 oz (65.4 kg)    Ideal Body Weight:  54.55 kg  BMI:  Body mass index is 24.75 kg/m.  Estimated Nutritional Needs:   Kcal:  1600-1800 kcal/day  Protein:  80-95 grams/day  Fluid:  1.6-1.8 L/day    Gaynell Face, MS, RD, LDN Pager: [phone] Weekend/After Hours: [phone]

## 2017-11-18 ENCOUNTER — Encounter (HOSPITAL_COMMUNITY): Payer: Self-pay | Admitting: General Practice

## 2017-11-18 ENCOUNTER — Inpatient Hospital Stay (HOSPITAL_COMMUNITY): Payer: Medicare Other

## 2017-11-18 ENCOUNTER — Other Ambulatory Visit: Payer: Self-pay

## 2017-11-18 LAB — COMPREHENSIVE METABOLIC PANEL
ALBUMIN: 3 g/dL — AB (ref 3.5–5.0)
ALK PHOS: 78 U/L (ref 38–126)
ALT: 21 U/L (ref 0–44)
AST: 22 U/L (ref 15–41)
Anion gap: 7 (ref 5–15)
BILIRUBIN TOTAL: 0.7 mg/dL (ref 0.3–1.2)
BUN: 30 mg/dL — AB (ref 8–23)
CALCIUM: 9.6 mg/dL (ref 8.9–10.3)
CO2: 22 mmol/L (ref 22–32)
CREATININE: 1.01 mg/dL — AB (ref 0.44–1.00)
Chloride: 113 mmol/L — ABNORMAL HIGH (ref 98–111)
GFR calc Af Amer: >60 mL/min (ref >60)
GFR calc non Af Amer: 53 mL/min — ABNORMAL LOW (ref >60)
GLUCOSE: 124 mg/dL — AB (ref 70–99)
Potassium: 3.4 mmol/L — ABNORMAL LOW (ref 3.5–5.1)
SODIUM: 142 mmol/L (ref 135–145)
TOTAL PROTEIN: 6.1 g/dL — AB (ref 6.5–8.1)

## 2017-11-18 LAB — CBC
HCT: 35.1 % — ABNORMAL LOW (ref 36.0–46.0)
Hemoglobin: 11 g/dL — ABNORMAL LOW (ref 12.0–15.0)
MCH: 29.7 pg (ref 26.0–34.0)
MCHC: 31.3 g/dL (ref 30.0–36.0)
MCV: 94.9 fL (ref 78.0–100.0)
PLATELETS: 269 10*3/uL (ref 150–400)
RBC: 3.7 MIL/uL — ABNORMAL LOW (ref 3.87–5.11)
RDW: 14.5 % (ref 11.5–15.5)
WBC: 12.2 10*3/uL — ABNORMAL HIGH (ref 4.0–10.5)

## 2017-11-18 LAB — GLUCOSE, CAPILLARY
GLUCOSE-CAPILLARY: 130 mg/dL — AB (ref 70–99)
Glucose-Capillary: 113 mg/dL — ABNORMAL HIGH (ref 70–99)
Glucose-Capillary: 184 mg/dL — ABNORMAL HIGH (ref 70–99)
Glucose-Capillary: 179 mg/dL — ABNORMAL HIGH (ref 70–99)

## 2017-11-18 MED ORDER — POTASSIUM CHLORIDE CRYS ER 20 MEQ PO TBCR
30.0000 meq | EXTENDED_RELEASE_TABLET | Freq: Three times a day (TID) | ORAL | Status: AC
  Administered 2017-11-18 – 2017-11-19 (×3): 30 meq via ORAL
  Filled 2017-11-18 (×3): qty 1

## 2017-11-18 MED ORDER — IRBESARTAN 300 MG PO TABS
150.0000 mg | ORAL_TABLET | Freq: Every day | ORAL | Status: DC
  Administered 2017-11-18 – 2017-11-21 (×4): 150 mg via ORAL
  Filled 2017-11-18 (×4): qty 1

## 2017-11-18 MED ORDER — MIRTAZAPINE 15 MG PO TABS
45.0000 mg | ORAL_TABLET | Freq: Every day | ORAL | Status: DC
  Administered 2017-11-18 – 2017-11-20 (×3): 45 mg via ORAL
  Filled 2017-11-18 (×4): qty 3

## 2017-11-18 MED ORDER — FUROSEMIDE 10 MG/ML IJ SOLN: 20.0000 mg | Freq: Every day | INTRAMUSCULAR | Status: DC

## 2017-11-18 MED ORDER — POTASSIUM CHLORIDE 10 MEQ/100ML IV SOLN
10.0000 meq | INTRAVENOUS | Status: DC
  Administered 2017-11-18: 10 meq via INTRAVENOUS
  Filled 2017-11-18: qty 100

## 2017-11-18 MED ORDER — FUROSEMIDE 10 MG/ML IJ SOLN
20.0000 mg | Freq: Two times a day (BID) | INTRAMUSCULAR | Status: DC
  Administered 2017-11-18 – 2017-11-19 (×3): 20 mg via INTRAVENOUS
  Filled 2017-11-18 (×3): qty 2

## 2017-11-18 MED ORDER — AMLODIPINE BESYLATE 10 MG PO TABS
10.0000 mg | ORAL_TABLET | Freq: Every day | ORAL | Status: DC
  Administered 2017-11-18 – 2017-11-21 (×4): 10 mg via ORAL
  Filled 2017-11-18 (×4): qty 1

## 2017-11-18 MED ORDER — CLOZAPINE 100 MG PO TABS
200.0000 mg | ORAL_TABLET | Freq: Every day | ORAL | Status: DC
  Administered 2017-11-18 – 2017-11-20 (×3): 200 mg via ORAL
  Filled 2017-11-18 (×3): qty 2

## 2017-11-18 MED ORDER — METOPROLOL TARTRATE 5 MG/5ML IV SOLN
2.5000 mg | Freq: Three times a day (TID) | INTRAVENOUS | Status: DC
  Administered 2017-11-18 – 2017-11-19 (×2): 2.5 mg via INTRAVENOUS
  Filled 2017-11-18 (×2): qty 5

## 2017-11-18 NOTE — Progress Notes (Signed)
Patient more alert and engaged. Answering questions appropriately. Patient was able to drink 120cc of ensure and ate pudding with no issues. Patient in bed resting. Call bell within reach. Bed alarm activated. Will continue to monitor patient.

## 2017-11-18 NOTE — Progress Notes (Signed)
[facility] Progress Note  Subjective: has been doing better for RN, speaking some today  Vitals:   11/17/17 2347 11/18/17 0511 11/18/17 0633 11/18/17 1040  BP: (!) 167/62 (!) 135/58 (!) 158/64 (!) 160/61  Pulse: 98 81 70 77  Resp:   18 18  Temp:   98.1 F (36.7 C)   TempSrc:   Oral   SpO2:   98% 99%  Weight:   66.1 kg (145 lb 11.6 oz)   Height:        Inpatient medications: . benztropine mesylate  1 mg Intravenous BID  . cloNIDine  0.2 mg Transdermal Q Mon-1800  . cloZAPine  200 mg Oral QHS  . feeding supplement (GLUCERNA SHAKE)  237 mL Oral TID BM  . furosemide  20 mg Intravenous Q12H  . insulin aspart  0-9 Units Subcutaneous TID WC  . metoprolol tartrate  5 mg Intravenous Q6H  . mirtazapine  45 mg Oral QHS  . potassium chloride  30 mEq Oral TID    acetaminophen **OR** acetaminophen, hydrALAZINE, ondansetron **OR** ondansetron (ZOFRAN) IV  Exam:  much more alert and fluid, tremors / eps in the face and jaw are resolved  no jvd  chest cta bilat  cor reg no mrg  abd obese soft ntnd no ascites +bs  ext no edema LE's or UE's  follows simple commands, trying to speak but not real intelligible, moves ext x 4  Presentation Summary: [name] is a 77 y.o. female with history of schizophrenia, hypertension, dementia, diabetes mellitus type 2 was brought to the ER after patient was complaining of dizziness and generalized weakness since yesterday morning.  Patient also had a one episode of diarrhea.  Denies any chest pain or shortness of breath or any nausea vomiting or productive cough.  Patient's daughter also states that patient has been having hallucinations with change in mental status since morning.  ED Course: In the ER patient appears generally weak and CT head was unremarkable.  Patient was afebrile and chest x-ray and UA was unremarkable.  Patient's nurse also noted that patient has been appearing confused and talking unrelated things.  Patient admitted for  further observation.  Patient blood pressure is markedly elevated.  Was given amlodipine in the ER.     Home meds:  - clozapine 200 mg hs/ haldol 5mg  am+ 10 mg pm  - mirtazapine 45 mg hs  - neurontin 100 mg hs prn pain  - namenda 10 mg bid/ aricept 10 mg hs  - norvasc 10 qd/ lasix 40 qd/ valsartan 160 qd  - ppi/ zantac/ lipitor/ Kdur/ norco prn  - lantus 15 u hs     Hospital Course/ Plan:  1) Acute encephalopathy with hallucinations/ history of dementia and schizophrenia: at baseline per daughter pt is functional w/ mild memory deficits and lives alone w/ an aid or family helping daily - suspect major issue is EPS reaction to chronic psych meds; sig better today off haldol and on cogentin, still has some room for improvement though - continue Cogentin 1 mg bid IV / IM - spoke w/ Dr Mariea Clonts of psychiatry today, will resume clozapine (low risk of EPS) and remeron, cont to hold haldol for now; she will see patient but not likely until tomorrow - 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 Acute, mild, no hx of CHF. Pt not in any distress - get echo and dc IVF's - start small dose IV lasix - po KCl    4) Diabetes mellitus type 2 -  - for now we will keep patient on sliding scale coverage, not eating much yet - is on lantus 15 u qhs at night, holding for now  5) Low grade fever : 99.2 overnight - UA on admit was negative - CXR w/o pna  - continue to monitor, nothing on exam to suggest acute infection  5) Hx breast Ca  6) CAD hx stent 2005   Kelly Splinter MD [facility] pgr [phone] 09/14/2017, 9:25 AM   Code Status: full DVT Prophylaxis: scd's Family Communication: talked w/ dtr on phone Disposition Plan: not sure yet Status: inpatient    Recent Labs  Lab 11/15/17 1601 11/18/17 0525  NA 145 142  K 3.8 3.4*  CL 111 113*  CO2 25 22  GLUCOSE 111* 124*  BUN 11 30*  CREATININE 0.94 1.01*  CALCIUM 9.6 9.6   Recent Labs  Lab 11/15/17 1601 11/18/17 0525  AST 24 22  ALT 26 21  ALKPHOS 90 78  BILITOT 0.5 0.7  PROT 7.6 6.1*  ALBUMIN 3.7 3.0*   Recent Labs  Lab 11/15/17 1601 11/18/17 0525  WBC 9.1 12.2*  NEUTROABS 6.2  --   HGB 12.2 11.0*  HCT 39.9 35.1*  MCV 96.6 94.9  PLT 290 269   Iron/TIBC/Ferritin/ %Sat    Component Value Date/Time   IRON 7 (L) 11/01/2015 1236   TIBC 202 (L) 11/01/2015 1236   FERRITIN 96 11/01/2015 1236   IRONPCTSAT 3 (L) 11/01/2015 1236

## 2017-11-18 NOTE — Progress Notes (Signed)
   11/18/17 0213  Urine Characteristics  Urinary Interventions Intermittent/Straight cath  Bladder Scan Volume (mL) 550 mL  Intermittent/Straight Cath (mL) 600 mL  Intermittent Catheter Size 16  Post Void Cath Residual (mL) 68 mL  Hygiene Peri care  In and out performed per order.

## 2017-11-19 ENCOUNTER — Inpatient Hospital Stay (HOSPITAL_COMMUNITY): Payer: Medicare Other

## 2017-11-19 DIAGNOSIS — Z818 Family history of other mental and behavioral disorders: Secondary | ICD-10-CM

## 2017-11-19 DIAGNOSIS — G4700 Insomnia, unspecified: Secondary | ICD-10-CM

## 2017-11-19 DIAGNOSIS — I313 Pericardial effusion (noninflammatory): Secondary | ICD-10-CM

## 2017-11-19 DIAGNOSIS — Z87891 Personal history of nicotine dependence: Secondary | ICD-10-CM

## 2017-11-19 DIAGNOSIS — F200 Paranoid schizophrenia: Secondary | ICD-10-CM

## 2017-11-19 DIAGNOSIS — F419 Anxiety disorder, unspecified: Secondary | ICD-10-CM

## 2017-11-19 LAB — BASIC METABOLIC PANEL
Anion gap: 9 (ref 5–15)
BUN: 25 mg/dL — AB (ref 8–23)
CHLORIDE: 113 mmol/L — AB (ref 98–111)
CO2: 22 mmol/L (ref 22–32)
Calcium: 9.4 mg/dL (ref 8.9–10.3)
Creatinine, Ser: 0.95 mg/dL (ref 0.44–1.00)
GFR calc Af Amer: >60 mL/min (ref >60)
GFR calc non Af Amer: 57 mL/min — ABNORMAL LOW (ref >60)
GLUCOSE: 152 mg/dL — AB (ref 70–99)
POTASSIUM: 4.5 mmol/L (ref 3.5–5.1)
SODIUM: 144 mmol/L (ref 135–145)

## 2017-11-19 LAB — GLUCOSE, CAPILLARY
GLUCOSE-CAPILLARY: 161 mg/dL — AB (ref 70–99)
Glucose-Capillary: 159 mg/dL — ABNORMAL HIGH (ref 70–99)
Glucose-Capillary: 127 mg/dL — ABNORMAL HIGH (ref 70–99)
Glucose-Capillary: 160 mg/dL — ABNORMAL HIGH (ref 70–99)

## 2017-11-19 LAB — CBC
HEMATOCRIT: 38.8 % (ref 36.0–46.0)
HEMOGLOBIN: 12 g/dL (ref 12.0–15.0)
MCH: 29.6 pg (ref 26.0–34.0)
MCHC: 30.9 g/dL (ref 30.0–36.0)
MCV: 95.8 fL (ref 78.0–100.0)
Platelets: 230 10*3/uL (ref 150–400)
RBC: 4.05 MIL/uL (ref 3.87–5.11)
RDW: 14.4 % (ref 11.5–15.5)
WBC: 12.8 10*3/uL — ABNORMAL HIGH (ref 4.0–10.5)

## 2017-11-19 LAB — ECHOCARDIOGRAM COMPLETE
Height: 64 in
Weight: 2285.73 oz

## 2017-11-19 MED ORDER — ADULT MULTIVITAMIN W/MINERALS CH
1.0000 | ORAL_TABLET | Freq: Every day | ORAL | Status: DC
  Administered 2017-11-19 – 2017-11-21 (×3): 1 via ORAL
  Filled 2017-11-19 (×3): qty 1

## 2017-11-19 MED ORDER — CARVEDILOL 12.5 MG PO TABS
12.5000 mg | ORAL_TABLET | Freq: Two times a day (BID) | ORAL | Status: DC
  Administered 2017-11-19 – 2017-11-20 (×3): 12.5 mg via ORAL
  Filled 2017-11-19 (×4): qty 1

## 2017-11-19 MED ORDER — ENOXAPARIN SODIUM 40 MG/0.4ML ~~LOC~~ SOLN
40.0000 mg | SUBCUTANEOUS | Status: DC
  Administered 2017-11-19 – 2017-11-20 (×2): 40 mg via SUBCUTANEOUS
  Filled 2017-11-19 (×2): qty 0.4

## 2017-11-19 MED ORDER — FUROSEMIDE 40 MG PO TABS
40.0000 mg | ORAL_TABLET | Freq: Every day | ORAL | Status: DC
  Administered 2017-11-19 – 2017-11-21 (×3): 40 mg via ORAL
  Filled 2017-11-19 (×3): qty 1

## 2017-11-19 MED ORDER — ENSURE ENLIVE PO LIQD
237.0000 mL | Freq: Three times a day (TID) | ORAL | Status: DC
  Administered 2017-11-19 – 2017-11-21 (×6): 237 mL via ORAL

## 2017-11-19 MED ORDER — HALOPERIDOL 5 MG PO TABS
5.0000 mg | ORAL_TABLET | Freq: Every day | ORAL | Status: DC
  Administered 2017-11-19 – 2017-11-20 (×2): 5 mg via ORAL
  Filled 2017-11-19 (×2): qty 1

## 2017-11-19 NOTE — Progress Notes (Signed)
Nutrition Follow-up  DOCUMENTATION CODES:   Not applicable  INTERVENTION:   -D/c Glucerna Shake po TID, each supplement provides 220 kcal and 10 grams of protein -MVI with minerals daily -Feeding assistance with meals -Ensure Enlive po TID, each supplement provides 350 kcal and 20 grams of protein -If pt remains with poor oral intake, continue to recommend placement of temporary feeding tube (ex cortrak) for initiation of enteral nutrition. Cortrak team is available on Monday, Wednesday, Friday, and Saturday from 0800-1600  NUTRITION DIAGNOSIS:   Inadequate oral intake related to lethargy/confusion as evidenced by meal completion < 25%, per patient/family report.  Ongoing  GOAL:   Patient will meet greater than or equal to 90% of their needs  Progressing  MONITOR:   PO intake, Supplement acceptance, Labs, I & O's, Weight trends  REASON FOR ASSESSMENT:   Low Braden    ASSESSMENT:   77 year old female who presented to the ED with dizziness, weakness, and diarrhea. PMH significant for dementia, schizophrenia, hypertension, hyperlipidemia, IBS, GERD, CAD, and type 2 diabetes mellitus.  Staff report that pt's mental status has improved over the past few days.   Spoke with pt, who was very selective about which questions she answered. RD spent time with pt and rephrased questions. Pt shares that her appetite is still poor, however, consumed some grits for breakfast. She is drinking "some" of her Glucerna shakes when offered. Meal completion remains poor; PO: 0-25%.   0-25%.   Assisted nutrition ambassador in room with obtaining pt's lunch order. Discussed with pt importance of consuming both meals and supplements to promote healing.   Pt with poor oral intake and would benefit from nutrient dense supplement. One Ensure Enlive supplement provides 350 kcals, 20 grams protein, and 44-45 grams of carbohydrate vs one Glucerna shake supplement, which provides 220 kcals, 10 grams of  protein, and 26 grams of carbohydrate. Given pt's hx of DM, RD will continue to monitor PO intake, CBGS, and adjust supplement regimen as appropriate.   Labs reviewed: CBGS: 127-179(inpatient orders for glycemic control are 0-9 units insulin aspart TID with meals).   Diet Order:   Diet Order           Diet heart healthy/carb modified Room service appropriate? Yes; Fluid consistency: Thin  Diet effective now          EDUCATION NEEDS:   Not appropriate for education at this time  Skin:  Skin Assessment: Reviewed RN Assessment  Last BM:  11/18/17  Height:   Ht Readings from Last 1 Encounters:  11/16/17 5\' 4"  (1.626 m)    Weight:   Wt Readings from Last 1 Encounters:  11/19/17 142 lb 13.7 oz (64.8 kg)    Ideal Body Weight:  54.55 kg  BMI:  Body mass index is 24.52 kg/m.  Estimated Nutritional Needs:   Kcal:  1600-1800 kcal/day  Protein:  80-95 grams/day  Fluid:  1.6-1.8 L/day   Aanshi Batchelder A. Jimmye Norman, RD, LDN, CDE Pager: [phone] After hours Pager: [phone]

## 2017-11-19 NOTE — Progress Notes (Signed)
  Echocardiogram 2D Echocardiogram has been performed.  Jennette Dubin 11/19/2017, 2:02 PM

## 2017-11-19 NOTE — Consult Note (Signed)
[facility] Consult   Reason for Consult:  Medication management  Referring Physician:  Dr. Jonnie Finner Patient Identification: [name] MRN:  [id number] Principal Diagnosis: Paranoid schizophrenia [facility]) Diagnosis:   Patient Active Problem List   Diagnosis Date Noted  . Altered mental status [R41.82]   . Septic shock ([place]) [A41.9, R65.21] 11/02/2015  . Acute blood loss anemia [D62] 11/02/2015  . Acute kidney injury ([place]) [N17.9] 11/02/2015  . Upper GI bleed [K92.2] 11/01/2015  . GI bleed [K92.2] 11/01/2015  . Breast cancer of upper-outer quadrant of left female breast ([place]) [C50.412] 08/29/2015  . Hypernatremia [E87.0] 12/23/2014  . Hypokalemia [E87.6] 12/23/2014  . Sepsis ([place]) [A41.9] 12/23/2014  . Anemia [D64.9] 12/23/2014  . Acute respiratory failure with hypoxemia ([place]) [J96.01]   . Acute respiratory failure with hypoxia ([place]) [J96.01] 12/10/2014  . ARDS (adult respiratory distress syndrome) ([place]) [J80] 12/10/2014  . 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 Blood poisoning [IMO0001]   . CAP (community acquired pneumonia) [J18.9] 12/09/2014  . DYSPHAGIA, PHARYNGOESOPHAGEAL PHASE [R13.14] 02/07/2009  . RENAL INSUFFICIENCY [N25.9] 02/05/2009  . TOBACCO USE, QUIT [Z87.891] 02/03/2009  . VERTIGO [R42] 06/22/2008  . BACK PAIN [M54.9] 10/26/2007  . HYPERLIPIDEMIA [E78.5] 06/02/2007  . Dementia [F03.90] 06/02/2007  . Schizophrenia, unspecified type ([place]) [F20.9] 06/02/2007  . PERIPHERAL VASCULAR DISEASE [I73.9] 06/02/2007  . ALLERGIC RHINITIS [J30.9] 06/02/2007  . IBS [K58.9] 06/02/2007  . FATIGUE [R53.81, R53.83] 06/02/2007  . PERIPHERAL EDEMA [R60.9] 06/02/2007  . Type II diabetes mellitus, well controlled ([place]) [E11.9] 09/04/2006  . ANEMIA-IRON DEFICIENCY [D50.9] 09/04/2006  . ANXIETY [F41.1] 09/04/2006  . PERIPHERAL NEUROPATHY [G60.9] 09/04/2006  . Essential hypertension [I10] 09/04/2006  . CORONARY ARTERY DISEASE [I25.10] 09/04/2006  . GERD [K21.9]  09/04/2006  . HX, PERSONAL, SCHIZOPHRENIA [Z86.59] 09/04/2006    Total Time spent with patient: 1 hour  Subjective:   [name] is a 77 y.o. female patient admitted with acute encephalopathy with psychosis.  HPI:  Per chart review, patient was admitted with acute encephalopathy with psychosis. She has a history of dementia and schizophrenia. Her daughter reports that at baseline she has mild memory deficits. She lives alone and has an aid or family assisting her daily. She presented to the ED with dizziness and generalized weakness for one day. Her daughter also reported that she was having hallucinations for the same time period. Primary team suspect that she had EPS due to psychotropic medications. Home medications include Clozaril 200 mg qhs, Donepezil 10 mg qhs, Gabapentin 100 mg qhs, Haldol 5 mg q am and 10 mg qhs, Namenda 10 mg BID and Remeron 45 mg qhs. Her medications were held on admission. Clozaril and Remeron were restarted yesterday. She reportedly started Baclofen a week ago.   On interview, [name] is slow to respond and provides brief but appropriate responses. She reports feeling anxious about being in the hospital although she was doing okay prior to recent symptom onset. She denies SI, HI or AVH. She reports last experiencing hallucinations on Saturday after the onset of weakness, dizziness and confusion. She reports poor sleep. She was recently restarted on Remeron. She reports no problems with appetite. She reports compliance with her medications. Her daughter was at bedside with her consent and provided any additional information that she was unable to provide. She reports that her mother has taken her current medication regimen for years and has been stable. She worries about decompensation if her medications are not restarted.   Past Psychiatric History: Schizophrenia and dementia.  Risk to Self:  None. Denies SI.  Risk to Others:  None. Denies HI. Prior Inpatient  Therapy:  Denies  Prior Outpatient Therapy:  She is followed by her PCP.   Past Medical History:  Past Medical History:  Diagnosis Date  . Allergic rhinitis   . Anemia, iron deficiency   . Anxiety   . Arthritis    back  . CAD (coronary artery disease)    CAD stent placed 2005  . Cancer ([place])    Left breast invasive ductal carcinoma  . Dementia   . Esophageal motility disorder   . GERD (gastroesophageal reflux disease)   . HTN (hypertension)   . Hyperlipidemia   . IBS (irritable bowel syndrome)   . Peripheral neuropathy   . Pneumonia 12-09-2014   adm with CAP requiring mechanical ventilation x 10d  . PVD (peripheral vascular disease) ([facility])    Left subclavian 70% stenosis  . Renal insufficiency   . Schizophrenia ([place])    paranoid schizophrenic  . Syncope    EP study 1996 w/atrial tachycardia  . Type II or unspecified type diabetes mellitus without mention of complication, not stated as uncontrolled     Past Surgical History:  Procedure Laterality Date  . ABDOMINAL HYSTERECTOMY    . BREAST BIOPSY     Negative  . ESOPHAGOGASTRODUODENOSCOPY N/A 11/02/2015   Procedure: ESOPHAGOGASTRODUODENOSCOPY (EGD);  Surgeon: Laurence Spates, MD;  Location: [facility];  Service: Endoscopy;  Laterality: N/A;  . JOINT REPLACEMENT Bilateral   . KNEE ARTHROPLASTY     Right   . PTCA  1995  . RADIOACTIVE SEED GUIDED PARTIAL MASTECTOMY WITH AXILLARY SENTINEL LYMPH NODE BIOPSY Left 08/17/2015   Procedure: RADIOACTIVE SEED GUIDED LUMPECTOMY WITH AXILLARY SENTINEL LYMPH NODE BIOPSY;  Surgeon: Donnie Mesa, MD;  Location: [facility];  Service: General;  Laterality: Left;  . RE-EXCISION OF BREAST LUMPECTOMY Left 09/07/2015   Procedure: RE-EXCISION OF LEFT BREAST LUMPECTOMY;  Surgeon: Donnie Mesa, MD;  Location: [facility];  Service: General;  Laterality: Left;   Family History:  Family History  Problem Relation Age of Onset  . Hypertension Other   . Diabetes Other    . Schizophrenia Neg Hx   . Coronary artery disease Neg Hx   . Colon cancer Neg Hx   . Breast cancer Neg Hx    Family Psychiatric  History: Sister with schizophrenia and son and 2 daughters with BPAD. Social History:  Social History   Substance and Sexual Activity  Alcohol Use No     Social History   Substance and Sexual Activity  Drug Use No    Social History   Socioeconomic History  . Marital status: Single    Spouse name: Not on file  . Number of children: Not on file  . Years of education: Not on file  . Highest education level: Not on file  Occupational History  . Not on file  Social Needs  . Financial resource strain: Not on file  . Food insecurity:    Worry: Not on file    Inability: Not on file  . Transportation needs:    Medical: Not on file    Non-medical: Not on file  Tobacco Use  . Smoking status: Former Research scientist (life sciences)  . Smokeless tobacco: Never Used  Substance and Sexual Activity  . Alcohol use: No  . Drug use: No  . Sexual activity: Not Currently  Lifestyle  . Physical activity:    Days per week: Not on file  Minutes per session: Not on file  . Stress: Not on file  Relationships  . Social connections:    Talks on phone: Not on file    Gets together: Not on file    Attends religious service: Not on file    Active member of club or organization: Not on file    Attends meetings of clubs or organizations: Not on file    Relationship status: Not on file  Other Topics Concern  . Not on file  Social History Narrative   11th grade education  Never married  5 children: 4 daughters, 1 son, 7 grandchildren  Lives alone  Has supportive family            Additional Social History: She lives at home alone. She has an aid that comes to her home daily. She has 4 daughters and 1 son and multiple grandchildren. She was a homemaker. She denies alcohol or illicit substance use.      Allergies:   Allergies  Allergen Reactions  . Codeine Phosphate Other (See  Comments)    Unknown reaction  . Dexilant [Dexlansoprazole] Other (See Comments)    weakness  . Glimepiride Other (See Comments)    Unknown reaction  . Glyburide Other (See Comments)    Unknown reaction  . Levofloxacin Nausea Only    Made her weak and nauseated  . Shellfish Allergy Swelling    Lip swelling  . Tramadol Other (See Comments)    Unknown reaction    Labs:  Results for orders placed or performed during the hospital encounter of 11/15/17 (from the past 48 hour(s))  Glucose, capillary     Status: Abnormal   Collection Time: 11/17/17 12:51 PM  Result Value Ref Range   Glucose-Capillary 132 (H) 70 - 99 mg/dL  Culture, blood (Routine X 2) w Reflex to ID Panel     Status: None (Preliminary result)   Collection Time: 11/17/17  3:40 PM  Result Value Ref Range   Specimen Description BLOOD RIGHT HAND    Special Requests      BOTTLES DRAWN AEROBIC ONLY Blood Culture adequate volume   Culture      NO GROWTH < 24 HOURS Performed at [facility] [address], [place], [place] [address]    Report Status PENDING   Culture, blood (Routine X 2) w Reflex to ID Panel     Status: None (Preliminary result)   Collection Time: 11/17/17  3:47 PM  Result Value Ref Range   Specimen Description BLOOD RIGHT HAND    Special Requests      BOTTLES DRAWN AEROBIC ONLY Blood Culture adequate volume   Culture      NO GROWTH < 24 HOURS Performed at [facility], [place] [address], [place], [place] [address]    Report Status PENDING   Glucose, capillary     Status: Abnormal   Collection Time: 11/17/17  4:42 PM  Result Value Ref Range   Glucose-Capillary 136 (H) 70 - 99 mg/dL  Glucose, capillary     Status: Abnormal   Collection Time: 11/17/17  9:15 PM  Result Value Ref Range   Glucose-Capillary 106 (H) 70 - 99 mg/dL   Comment 1 Notify RN    Comment 2 Document in Chart   Comprehensive metabolic panel     Status: Abnormal   Collection Time: 11/18/17  5:25 AM  Result Value Ref  Range   Sodium 142 135 - 145 mmol/L   Potassium 3.4 (  L) 3.5 - 5.1 mmol/L   Chloride 113 (H) 98 - 111 mmol/L   CO2 22 22 - 32 mmol/L   Glucose, Bld 124 (H) 70 - 99 mg/dL   BUN 30 (H) 8 - 23 mg/dL   Creatinine, Ser 1.01 (H) 0.44 - 1.00 mg/dL   Calcium 9.6 8.9 - 10.3 mg/dL   Total Protein 6.1 (L) 6.5 - 8.1 g/dL   Albumin 3.0 (L) 3.5 - 5.0 g/dL   AST 22 15 - 41 U/L   ALT 21 0 - 44 U/L   Alkaline Phosphatase 78 38 - 126 U/L   Total Bilirubin 0.7 0.3 - 1.2 mg/dL   GFR calc non Af Amer 53 (L) >60 mL/min   GFR calc Af Amer >60 >60 mL/min    Comment: (NOTE) The eGFR has been calculated using the CKD EPI equation. This calculation has not been validated in all clinical situations. eGFR's persistently <60 mL/min signify possible Chronic Kidney Disease.    Anion gap 7 5 - 15    Comment: Performed at [facility] [address] [place], [place] [address]  CBC     Status: Abnormal   Collection Time: 11/18/17  5:25 AM  Result Value Ref Range   WBC 12.2 (H) 4.0 - 10.5 K/uL   RBC 3.70 (L) 3.87 - 5.11 MIL/uL   Hemoglobin 11.0 (L) 12.0 - 15.0 g/dL   HCT 35.1 (L) 36.0 - 46.0 %   MCV 94.9 78.0 - 100.0 fL   MCH 29.7 26.0 - 34.0 pg   MCHC 31.3 30.0 - 36.0 g/dL   RDW 14.5 11.5 - 15.5 %   Platelets 269 150 - 400 K/uL    Comment: Performed at [facility], [place] [address] [place], [place] [address]  Glucose, capillary     Status: Abnormal   Collection Time: 11/18/17  7:31 AM  Result Value Ref Range   Glucose-Capillary 113 (H) 70 - 99 mg/dL  Glucose, capillary     Status: Abnormal   Collection Time: 11/18/17 11:18 AM  Result Value Ref Range   Glucose-Capillary 184 (H) 70 - 99 mg/dL  Glucose, capillary     Status: Abnormal   Collection Time: 11/18/17  4:30 PM  Result Value Ref Range   Glucose-Capillary 130 (H) 70 - 99 mg/dL  Glucose, capillary     Status: Abnormal   Collection Time: 11/18/17  9:47 PM  Result Value Ref Range   Glucose-Capillary 179 (H) 70 - 99 mg/dL   Comment 1  Notify RN    Comment 2 Document in Chart   Basic metabolic panel     Status: Abnormal   Collection Time: 11/19/17  6:15 AM  Result Value Ref Range   Sodium 144 135 - 145 mmol/L   Potassium 4.5 3.5 - 5.1 mmol/L   Chloride 113 (H) 98 - 111 mmol/L   CO2 22 22 - 32 mmol/L   Glucose, Bld 152 (H) 70 - 99 mg/dL   BUN 25 (H) 8 - 23 mg/dL   Creatinine, Ser 0.95 0.44 - 1.00 mg/dL   Calcium 9.4 8.9 - 10.3 mg/dL   GFR calc non Af Amer 57 (L) >60 mL/min   GFR calc Af Amer >60 >60 mL/min    Comment: (NOTE) The eGFR has been calculated using the CKD EPI equation. This calculation has not been validated in all clinical situations. eGFR's persistently <60 mL/min signify possible Chronic Kidney Disease.    Anion gap 9 5 - 15  Comment: Performed at [facility], [place] [address], [place], [place] [address]  CBC     Status: Abnormal   Collection Time: 11/19/17  6:15 AM  Result Value Ref Range   WBC 12.8 (H) 4.0 - 10.5 K/uL   RBC 4.05 3.87 - 5.11 MIL/uL   Hemoglobin 12.0 12.0 - 15.0 g/dL   HCT 38.8 36.0 - 46.0 %   MCV 95.8 78.0 - 100.0 fL   MCH 29.6 26.0 - 34.0 pg   MCHC 30.9 30.0 - 36.0 g/dL   RDW 14.4 11.5 - 15.5 %   Platelets 230 150 - 400 K/uL    Comment: Performed at [facility], [place] [address], [place], [place] [address]  Glucose, capillary     Status: Abnormal   Collection Time: 11/19/17  7:36 AM  Result Value Ref Range   Glucose-Capillary 159 (H) 70 - 99 mg/dL   Comment 1 Notify RN    Comment 2 Document in Chart   Glucose, capillary     Status: Abnormal   Collection Time: 11/19/17 11:44 AM  Result Value Ref Range   Glucose-Capillary 127 (H) 70 - 99 mg/dL   Comment 1 Notify RN    Comment 2 Document in Chart     Current Facility-Administered Medications  Medication Dose Route Frequency Provider Last Rate Last Dose  . acetaminophen (TYLENOL) tablet 650 mg  650 mg Oral Q6H PRN Rise Patience, MD       Or  . acetaminophen (TYLENOL) suppository 650 mg  650 mg  Rectal Q6H PRN Rise Patience, MD      . amLODipine (NORVASC) tablet 10 mg  10 mg Oral Daily Roney Jaffe, MD   10 mg at 11/19/17 0813  . benztropine mesylate (COGENTIN) injection 1 mg  1 mg Intravenous BID Roney Jaffe, MD   1 mg at 11/19/17 0814  . cloNIDine (CATAPRES - Dosed in mg/24 hr) patch 0.2 mg  0.2 mg Transdermal Q Mon-1800 Roney Jaffe, MD   0.2 mg at 11/17/17 1750  . cloZAPine (CLOZARIL) tablet 200 mg  200 mg Oral QHS Roney Jaffe, MD   200 mg at 11/18/17 1617  . feeding supplement (ENSURE ENLIVE) (ENSURE ENLIVE) liquid 237 mL  237 mL Oral TID BM Danford, Suann Larry, MD      . furosemide (LASIX) injection 20 mg  20 mg Intravenous Q12H Roney Jaffe, MD   20 mg at 11/19/17 0815  . hydrALAZINE (APRESOLINE) injection 10-20 mg  10-20 mg Intravenous Q4H PRN Roney Jaffe, MD      . insulin aspart (novoLOG) injection 0-9 Units  0-9 Units Subcutaneous TID WC Rise Patience, MD   2 Units at 11/19/17 [phone]  . irbesartan (AVAPRO) tablet 150 mg  150 mg Oral Daily Roney Jaffe, MD   150 mg at 11/19/17 0813  . metoprolol tartrate (LOPRESSOR) injection 2.5 mg  2.5 mg Intravenous Q8H Roney Jaffe, MD   2.5 mg at 11/19/17 0646  . mirtazapine (REMERON) tablet 45 mg  45 mg Oral QHS Roney Jaffe, MD   45 mg at 11/18/17 1613  . multivitamin with minerals tablet 1 tablet  1 tablet Oral Daily Danford, Suann Larry, MD      . ondansetron (ZOFRAN) tablet 4 mg  4 mg Oral Q6H PRN Rise Patience, MD       Or  . ondansetron [facility]) injection 4 mg  4 mg Intravenous Q6H PRN Rise Patience, MD  Musculoskeletal: Strength & Muscle Tone: within normal limits Gait & Station: UTA since patient is lying in bed. Patient leans: N/A  Psychiatric Specialty Exam: Physical Exam  Nursing note and vitals reviewed. Constitutional: She is oriented to person, place, and time. She appears well-developed and well-nourished.  HENT:  Head: Normocephalic and atraumatic.   Neck: Normal range of motion.  Respiratory: Effort normal.  Musculoskeletal: Normal range of motion.  Neurological: She is alert and oriented to person, place, and time.  Skin: No rash noted.  Psychiatric: She has a normal mood and affect. Her speech is normal and behavior is normal. Judgment and thought content normal. Cognition and memory are normal.    Review of Systems  Constitutional: Positive for chills. Negative for fever.  Cardiovascular: Negative for chest pain.  Gastrointestinal: Positive for diarrhea. Negative for abdominal pain, constipation, nausea and vomiting.  Psychiatric/Behavioral: Negative for depression, hallucinations, substance abuse and suicidal ideas. The patient is nervous/anxious and has insomnia.   All other systems reviewed and are negative.   Blood pressure (!) 155/70, pulse 90, temperature 98 F (36.7 C), temperature source Oral, resp. rate 20, height '5\' 4"'  (1.626 m), weight 64.8 kg (142 lb 13.7 oz), SpO2 97 %.Body mass index is 24.52 kg/m.  General Appearance: Fairly Groomed, elderly, African American female, wearing a hospital gown with short hair and lying in bed. NAD.   Eye Contact:  Good  Speech:  Clear and Coherent and Normal Rate  Volume:  Normal  Mood:  Anxious  Affect:  Congruent  Thought Process:  Goal Directed, Linear and Descriptions of Associations: Intact  Orientation:  Full (Time, Place, and Person)  Thought Content:  Logical  Suicidal Thoughts:  No  Homicidal Thoughts:  No  Memory:  Immediate;   Fair Recent;   Fair Remote;   Fair  Judgement:  Fair  Insight:  Fair  Psychomotor Activity:  Decreased  Concentration:  Concentration: Good and Attention Span: Good  Recall:  [employer] of Knowledge:  Fair  Language:  Good  Akathisia:  No  Handed:  Right  AIMS (if indicated):   N/A  Assets:  Communication Skills Housing Social Support  ADL's:  Impaired  Cognition:  WNL  Sleep:   Poor   Assessment:  [name] is a 77 y.o.  female who was admitted with acute encephalopathy. Primary team was concerned for EPS symptoms (muscle rigitidy and locked jaw) so psychotropic medications were held. She reportedly started Baclofen 2 days prior to symptom onset. Her symptoms were most likely related to starting Baclofen given the acute nature of onset. She has been taking her psychotropic medication regimen for years without changes so this was least likely the cause of her symptoms. She does not appear to have muscle rigidity on exam although she slightly tensed her muscles at times. She denies SI, HI or AVH. She does not appear to be responding to internal stimuli. She does not warrant inpatient psychiatric hospitalization at this time.   Treatment Plan Summary: -Continue Clozaril 200 mg qhs for schizophrenia. -Continue Remeron 45 mg qhs for depression and insomnia. -Can consider Melatonin 3-6 mg qhs PRN if problems with sleep persist but likely experiencing insomnia secondary to being in the hospital.  -Restart Haldol 5 mg qhs, increase to 5 mg BID on 8/1 then to 5 mg q am and 10 mg qhs on 8/3. If patient appears overly sedated then can lower to prior tolerated dose.  -Continue Cogentin 1 mg BID for EPS prophylaxis. Outpatient  doctor may determine if medication should be continued since Cogentin can increase risk for tardive dyskinesia.  -Patient should follow up with her outpatient provider for further medication management.  -Psychiatry will sign off on patient at this time. Please consult psychiatry again as needed.   Disposition: No evidence of imminent risk to self or others at present.   Patient does not meet criteria for psychiatric inpatient admission.  Faythe Dingwall, DO 11/19/2017 12:48 PM

## 2017-11-19 NOTE — Progress Notes (Signed)
PROGRESS NOTE    [name]  IRJ:[id number] DOB: 1940-09-17 DOA: 11/15/2017 PCP: [name], MD      Brief Narrative:  [name] is a 77 y.o. F with mild dementia, community dwelling, paranoid schizophrenia, HTN, and DM who presented with generalized weakness and hallucinations and new confusion.  Apparently, at baseline, patient is able to live alone with intermittent supervision only.  She initially reported some vague/nonspecific complaints of generalized weakness, dizziness and diarrhea to EDP.  On evaluation by admitting MD, she was delirious, "talking unrelated things", confused, but overall nonfocal.  Admitted, started on IV Haldol q6H and worsened.  Then Haldol stopped, started on Cogentin given concern for EPS/parkinsonism.      Assessment & Plan:  Acute metabolic encephalopathy Differential includes baclofen induced, hypertensive encephalopathy, metabolic from occult infection.  Psychiatry evaluated patient in person today, do not suspect EPS, do not suspect psychosis from schizophrenia. -BP control -Rule out infection  Leukocytosis Unclear cause.  Has a low grade fever and complains of wrosening of chronic back pain.  Urinalysis and CXR suggest no infection.  Blood cultures negative. -MRI lumbar spine -Follow blood cultures  Hypertensive urgency -Continue amlodipine and valsartan, Lasix -Hold IV metoprolol -Start carvedilol -Continue clonidine patch  Acute on chronic diastolic CHF No hypoxia, respiratory distress.  Appears euvolemic.  Creatinine stable.   -Stop IV Lasix -Resume home oral Lasix -Continue daily weights, I/Os  Schizophrenia -Continue clozapine -Restart home oral Haldol  -Consult to Psychiatry, appreciate cares -Continue Cogentin for now -Continue mirtazapine    DVT prophylaxis: Lovenox Code Status: FULL Family Communication: Daughter at bedside MDM and disposition Plan: The below labs and imaging reports were reviewed and  summarized above.    The patient was admitted with encephalopathy of unclear cause.  She remains confused.  Will obtain MRI spine today, then PT eval.  Continue to adjust BP medicines monitro mental status.   Consultants:   Psychiatry  Procedures:   MRI brain - no stroke  CT head on admission unremakrable  Echocardiogram  ------------------------------------------------------------------- Study Conclusions  - Left ventricle: The cavity size was normal. Systolic function was   vigorous. The estimated ejection fraction was in the range of 65%   to 70%. Wall motion was normal; there were no regional wall   motion abnormalities. Doppler parameters are consistent with   abnormal left ventricular relaxation (grade 1 diastolic   dysfunction). - Mitral valve: Calcified annulus. - Pericardium, extracardiac: A small to moderate pericardial   effusion was identified posterior to the heart. There was no   evidence of hemodynamic compromise.  Antimicrobials:   None   Cultures:   7/29 Blood cultures x2     Subjective: Oriented to [facility] and 2019, but still not fully oriented.  Attention diminished.  No vomiting, respiratory distress.  No eizures, no LOC, no rigidity, no cough, no diarrhea  Objective: Vitals:   11/18/17 1040 11/18/17 1959 11/19/17 0422 11/19/17 1149  BP: (!) 160/61 (!) 173/66 (!) 151/72 (!) 155/70  Pulse: 77 (!) 104 98 90  Resp: 18 20 18 20   Temp:  97.6 F (36.4 C) 98 F (36.7 C) (!) 96.7 F (35.9 C)  TempSrc:  Oral Oral Oral  SpO2: 99% 96% 99% 97%  Weight:   64.8 kg (142 lb 13.7 oz)   Height:        Intake/Output Summary (Last 24 hours) at 11/19/2017 1609 Last data filed at 11/19/2017 1500 Gross per 24 hour  Intake -  Output 800 ml  Net -800 ml   Filed Weights   11/17/17 0522 11/18/17 0633 11/19/17 0422  Weight: 65.4 kg (144 lb 2.9 oz) 66.1 kg (145 lb 11.6 oz) 64.8 kg (142 lb 13.7 oz)    Examination: General appearance: elderly adult female,  alert and in no acute distress.   HEENT: Anicteric, conjunctiva pink, lids and lashes normal. No nasal deformity, discharge, epistaxis.  Lips moist, dentures in place, OP dry, no oral lesions, hearing normal.   Skin: Warm and dry.  no jaundice.  No suspicious rashes or lesions. Cardiac: RRR, nl S1-S2, no murmurs appreciated.  Capillary refill is brisk.  JVP normal.  No LE edema.  Radia  pulses 2+ and symmetric. Respiratory: Normal respiratory rate and rhythm.  CTAB without rales or wheezes. Abdomen: Abdomen soft.  no TTP. No ascites, distension, hepatosplenomegaly.   MSK: No deformities or effusions in large joints of upper or lower extremities bilaterally. Neuro: Awake and reponsive to questions but psychomotor slowing noted.  EOMI, 5/5 strength in upper extremities symmetricallly.  Lower extremity weakness bilaterally, but symmetric. Speech fluent.    Psych: Sensorium intact and responding to questions, attention dimiihsed. Affect blunted.  Judgment and insight appear severely impaired.    Data Reviewed: I have personally reviewed following labs and imaging studies:  CBC: Recent Labs  Lab 11/15/17 1601 11/18/17 0525 11/19/17 0615  WBC 9.1 12.2* 12.8*  NEUTROABS 6.2  --   --   HGB 12.2 11.0* 12.0  HCT 39.9 35.1* 38.8  MCV 96.6 94.9 95.8  PLT 290 269 703   Basic Metabolic Panel: Recent Labs  Lab 11/15/17 1601 11/18/17 0525 11/19/17 0615  NA 145 142 144  K 3.8 3.4* 4.5  CL 111 113* 113*  CO2 25 22 22   GLUCOSE 111* 124* 152*  BUN 11 30* 25*  CREATININE 0.94 1.01* 0.95  CALCIUM 9.6 9.6 9.4   GFR: Estimated Creatinine Clearance: 43.5 mL/min (by C-G formula based on SCr of 0.95 mg/dL). Liver Function Tests: Recent Labs  Lab 11/15/17 1601 11/18/17 0525  AST 24 22  ALT 26 21  ALKPHOS 90 78  BILITOT 0.5 0.7  PROT 7.6 6.1*  ALBUMIN 3.7 3.0*   Recent Labs  Lab 11/15/17 1601  LIPASE 25   No results for input(s): AMMONIA in the last 168 hours. Coagulation  Profile: No results for input(s): INR, PROTIME in the last 168 hours. Cardiac Enzymes: No results for input(s): CKTOTAL, CKMB, CKMBINDEX, TROPONINI in the last 168 hours. BNP (last 3 results) No results for input(s): PROBNP in the last 8760 hours. HbA1C: No results for input(s): HGBA1C in the last 72 hours. CBG: Recent Labs  Lab 11/18/17 1118 11/18/17 1630 11/18/17 2147 11/19/17 0736 11/19/17 1144  GLUCAP 184* 130* 179* 159* 127*   Lipid Profile: No results for input(s): CHOL, HDL, LDLCALC, TRIG, CHOLHDL, LDLDIRECT in the last 72 hours. Thyroid Function Tests: No results for input(s): TSH, T4TOTAL, FREET4, T3FREE, THYROIDAB in the last 72 hours. Anemia Panel: No results for input(s): VITAMINB12, FOLATE, FERRITIN, TIBC, IRON, RETICCTPCT in the last 72 hours. Urine analysis:    Component Value Date/Time   COLORURINE YELLOW 11/15/2017 1820   APPEARANCEUR HAZY (A) 11/15/2017 1820   LABSPEC 1.010 11/15/2017 1820   PHURINE 8.0 11/15/2017 1820   GLUCOSEU NEGATIVE 11/15/2017 1820   GLUCOSEU NEGATIVE 06/22/2008 0957   HGBUR NEGATIVE 11/15/2017 1820   HGBUR negative 12/12/2009 [place] 11/15/2017 1820   BILIRUBINUR small 11/09/2010 [place] 11/15/2017 1820  PROTEINUR NEGATIVE 11/15/2017 1820   UROBILINOGEN 1.0 12/12/2014 1100   NITRITE NEGATIVE 11/15/2017 1820   LEUKOCYTESUR NEGATIVE 11/15/2017 1820   Sepsis Labs: @LABRCNTIP (procalcitonin:4,lacticacidven:4)  ) Recent Results (from the past 240 hour(s))  MRSA PCR Screening     Status: None   Collection Time: 11/16/17 12:29 AM  Result Value Ref Range Status   MRSA by PCR NEGATIVE NEGATIVE Final    Comment:        The GeneXpert MRSA Assay (FDA approved for NASAL specimens only), is one component of a comprehensive MRSA colonization surveillance program. It is not intended to diagnose MRSA infection nor to guide or monitor treatment for MRSA infections. Performed at [facility], [place] [address] [place], [place] [address]   Culture, blood (Routine X 2) w Reflex to ID Panel     Status: None (Preliminary result)   Collection Time: 11/17/17  3:40 PM  Result Value Ref Range Status   Specimen Description BLOOD RIGHT HAND  Final   Special Requests   Final    BOTTLES DRAWN AEROBIC ONLY Blood Culture adequate volume   Culture   Final    NO GROWTH 2 DAYS Performed at [facility], [place] [address], [place], [place] [address]    Report Status PENDING  Incomplete  Culture, blood (Routine X 2) w Reflex to ID Panel     Status: None (Preliminary result)   Collection Time: 11/17/17  3:47 PM  Result Value Ref Range Status   Specimen Description BLOOD RIGHT HAND  Final   Special Requests   Final    BOTTLES DRAWN AEROBIC ONLY Blood Culture adequate volume   Culture   Final    NO GROWTH 2 DAYS Performed at [facility], [place] [address] [place], [place] [address]    Report Status PENDING  Incomplete         Radiology Studies: Dg Chest Port 1 View  Result Date: 11/18/2017 CLINICAL DATA:  Follow-up exam EXAM: PORTABLE CHEST 1 VIEW COMPARISON:  11/16/2017 FINDINGS: Cardiomegaly with vascular congestion. Interstitial prominence could reflect early interstitial edema. Elevation of the right hemidiaphragm, stable. No confluent opacities or visible effusions. IMPRESSION: Cardiomegaly with vascular congestion and suspected early interstitial edema. Stable elevation of the right hemidiaphragm. Electronically Signed   By: Rolm Baptise M.D.   On: 11/18/2017 09:20        Scheduled Meds: . amLODipine  10 mg Oral Daily  . benztropine mesylate  1 mg Intravenous BID  . cloNIDine  0.2 mg Transdermal Q Mon-1800  . cloZAPine  200 mg Oral QHS  . feeding supplement (ENSURE ENLIVE)  237 mL Oral TID BM  . furosemide  20 mg Intravenous Q12H  . haloperidol  5 mg Oral QHS  . insulin aspart  0-9 Units Subcutaneous TID WC  . irbesartan  150 mg Oral Daily  . metoprolol  tartrate  2.5 mg Intravenous Q8H  . mirtazapine  45 mg Oral QHS  . multivitamin with minerals  1 tablet Oral Daily   Continuous Infusions:   LOS: 3 days    Time spent: 25 minutes    Edwin Dada, MD [facility] 11/19/2017, 4:09 PM     Pager [phone] --- please page though AMION:  [email or web address] If 7PM-7AM, please contact night-coverage

## 2017-11-20 DIAGNOSIS — I739 Peripheral vascular disease, unspecified: Secondary | ICD-10-CM

## 2017-11-20 DIAGNOSIS — E119 Type 2 diabetes mellitus without complications: Secondary | ICD-10-CM

## 2017-11-20 DIAGNOSIS — I10 Essential (primary) hypertension: Secondary | ICD-10-CM

## 2017-11-20 DIAGNOSIS — C50412 Malignant neoplasm of upper-outer quadrant of left female breast: Secondary | ICD-10-CM

## 2017-11-20 DIAGNOSIS — R410 Disorientation, unspecified: Secondary | ICD-10-CM

## 2017-11-20 LAB — BASIC METABOLIC PANEL
Anion gap: 9 (ref 5–15)
BUN: 28 mg/dL — ABNORMAL HIGH (ref 8–23)
CHLORIDE: 111 mmol/L (ref 98–111)
CO2: 23 mmol/L (ref 22–32)
Calcium: 9.6 mg/dL (ref 8.9–10.3)
Creatinine, Ser: 1.19 mg/dL — ABNORMAL HIGH (ref 0.44–1.00)
GFR calc non Af Amer: 43 mL/min — ABNORMAL LOW (ref >60)
GFR, EST AFRICAN AMERICAN: 50 mL/min — AB (ref >60)
GLUCOSE: 189 mg/dL — AB (ref 70–99)
Potassium: 4.9 mmol/L (ref 3.5–5.1)
Sodium: 143 mmol/L (ref 135–145)

## 2017-11-20 LAB — GLUCOSE, CAPILLARY
GLUCOSE-CAPILLARY: 221 mg/dL — AB (ref 70–99)
GLUCOSE-CAPILLARY: 126 mg/dL — AB (ref 70–99)
Glucose-Capillary: 132 mg/dL — ABNORMAL HIGH (ref 70–99)
Glucose-Capillary: 189 mg/dL — ABNORMAL HIGH (ref 70–99)

## 2017-11-20 MED ORDER — INSULIN ASPART 100 UNIT/ML ~~LOC~~ SOLN
0.0000 [IU] | Freq: Three times a day (TID) | SUBCUTANEOUS | Status: DC
Start: 2017-11-21 — End: 2017-11-21
  Administered 2017-11-21 (×2): 2 [IU] via SUBCUTANEOUS

## 2017-11-20 MED ORDER — SODIUM CHLORIDE 0.9 % IV SOLN
INTRAVENOUS | Status: DC
  Administered 2017-11-20 – 2017-11-21 (×2): via INTRAVENOUS

## 2017-11-20 MED ORDER — CARVEDILOL 12.5 MG PO TABS
12.5000 mg | ORAL_TABLET | Freq: Two times a day (BID) | ORAL | Status: DC
  Administered 2017-11-21: 12.5 mg via ORAL
  Filled 2017-11-20: qty 1

## 2017-11-20 NOTE — Evaluation (Signed)
Physical Therapy Evaluation Patient Details Name: [name] MRN: [id number] DOB: 1941-04-08 Today's Date: 11/20/2017   History of Present Illness  Mrs. Martinezgarcia is a 77 y.o. F with mild dementia, community dwelling, paranoid schizophrenia, HTN, and DM who presented with generalized weakness and hallucinations and new confusion.  Apparently, at baseline, patient is able to live alone with intermittent supervision only.  She initially reported some vague/nonspecific complaints of generalized weakness, dizziness and diarrhea to EDP.  On evaluation by admitting MD, she was delirious, "talking unrelated things", confused, but overall nonfocal.    Clinical Impression  Pt admitted with above diagnosis. Pt currently with functional limitations due to the deficits listed below (see PT Problem List). Pt with altered mental status, unreliable historian no family present to provide acurate history. Pt with inconsistent replies to questions, AO to person and place only. At time of eval, pt lethargic, taking increased time to process requests and requiring max A to stand after several failed attempts. Unable to side step along bed or take steps off bed safely at this time.  Pt will benefit from skilled PT to increase their independence and safety with mobility to allow discharge to the venue listed below.        Follow Up Recommendations SNF;Supervision/Assistance - 24 hour    Equipment Recommendations  (TBD )    Recommendations for Other Services OT consult     Precautions / Restrictions Precautions Precautions: Fall Restrictions Weight Bearing Restrictions: No      Mobility  Bed Mobility Overal bed mobility: Needs Assistance Bed Mobility: Supine to Sit;Sit to Supine     Supine to sit: Mod assist Sit to supine: Mod assist   General bed mobility comments: Mod A to help body up and legs over EOB  Transfers Overall transfer level: Needs assistance   Transfers: Sit to/from Stand Sit to  Stand: Max assist         General transfer comment: Max A, several trials to come to standing. Pt unable to take safe steps off bed, or side step up along bed. collapses back onto bed.   Ambulation/Gait             General Gait Details: unable at this time  Stairs            Wheelchair Mobility    Modified Rankin (Stroke Patients Only)       Balance Overall balance assessment: Needs assistance   Sitting balance-Leahy Scale: Fair       Standing balance-Leahy Scale: Poor                               Pertinent Vitals/Pain Pain Assessment: Faces Faces Pain Scale: Hurts little more Pain Location: Back Pain Descriptors / Indicators: Aching Pain Intervention(s): Limited activity within patient's tolerance    Home Living Family/patient expects to be discharged to:: Unsure                 Additional Comments: (Pt unreliable historian, states she lives in ALF for years)    Prior Function           Comments: (Pt unreliable historian )     Hand Dominance        Extremity/Trunk Assessment   Upper Extremity Assessment Upper Extremity Assessment: Generalized weakness;Difficult to assess due to impaired cognition    Lower Extremity Assessment Lower Extremity Assessment: Generalized weakness;Difficult to assess due to impaired cognition    Cervical /  Trunk Assessment Cervical / Trunk Assessment: Normal  Communication   Communication: No difficulties  Cognition Arousal/Alertness: Lethargic Behavior During Therapy: Flat affect Overall Cognitive Status: No family/caregiver present to determine baseline cognitive functioning                                 General Comments: AO to person, place only at this time. Following commands with increased time       General Comments      Exercises     Assessment/Plan    PT Assessment Patient needs continued PT services  PT Problem List Decreased strength;Decreased  range of motion;Decreased activity tolerance;Decreased balance;Decreased mobility;Decreased coordination;Decreased cognition;Decreased safety awareness;Pain       PT Treatment Interventions DME instruction;Gait training;Stair training;Functional mobility training;Therapeutic activities;Therapeutic exercise;Balance training    PT Goals (Current goals can be found in the Care Plan section)  Acute Rehab PT Goals Patient Stated Goal: non stated PT Goal Formulation: With patient Time For Goal Achievement: 11/27/17 Potential to Achieve Goals: Fair    Frequency Min 3X/week   Barriers to discharge        Co-evaluation               AM-PAC PT "6 Clicks" Daily Activity  Outcome Measure Difficulty turning over in bed (including adjusting bedclothes, sheets and blankets)?: Unable Difficulty moving from lying on back to sitting on the side of the bed? : Unable Difficulty sitting down on and standing up from a chair with arms (e.g., wheelchair, bedside commode, etc,.)?: Unable Help needed moving to and from a bed to chair (including a wheelchair)?: Total Help needed walking in hospital room?: Total Help needed climbing 3-5 steps with a railing? : Total 6 Click Score: 6    End of Session Equipment Utilized During Treatment: Gait belt Activity Tolerance: Patient limited by fatigue Patient left: in bed;with call bell/phone within reach;with bed alarm set Nurse Communication: Mobility status PT Visit Diagnosis: Unsteadiness on feet (R26.81);Pain;Muscle weakness (generalized) (M62.81) Pain - part of body: (back)    Time: 5208-0223 PT Time Calculation (min) (ACUTE ONLY): 27 min   Charges:   PT Evaluation $PT Eval Moderate Complexity: 1 Mod PT Treatments $Therapeutic Activity: 8-22 mins        Reinaldo Berber, PT, DPT Acute Rehab Services Pager: [phone]    Reinaldo Berber 11/20/2017, 2:21 PM

## 2017-11-20 NOTE — Care Management Important Message (Signed)
Important Message  Patient Details  Name: [name] MRN: [id number] Date of Birth: 1940/10/29   Medicare Important Message Given:  Yes    [name] P [place] 11/20/2017, 3:41 PM

## 2017-11-20 NOTE — Progress Notes (Signed)
PROGRESS NOTE    Ronna Herskowitz  HYI:[id number] DOB: [birth year] DOA: 11/15/2017 PCP: [name], MD      Brief Narrative:  [name] is a 77 y.o. F with mild dementia, community dwelling, paranoid schizophrenia, HTN, and DM who presented with generalized weakness and hallucinations and new confusion.  Apparently, at baseline, patient is able to live alone with intermittent supervision only.  She initially reported some vague/nonspecific complaints of generalized weakness, dizziness and diarrhea to EDP.  On evaluation by admitting MD, she was delirious, "talking unrelated things", confused, but overall nonfocal.  Admitted, started on IV Haldol q6H and worsened.  Then Haldol stopped, started on Cogentin given concern for EPS/parkinsonism.          Assessment & Plan:  Acute metabolic encephalopathy Etiology not entirely clear.  CT and MRI brain unremarkable.  Urine and CXR clear, blood cultures negative, no fever in last 3 days.  MRI spine for occult infection (given increased back pain) was pursued but was negative, showed only severe foraminal disease.    The differential at this time probably still only includes dehydration, baclofen reaction, or hypertensive encephalopathy.  Large doses of IV Haldol on hospital day 1 confound the picture.  Psychiatry evaluated patient in person, do not suspect EPS, do not suspect psychosis from schizophrenia.  This appears to be improving. -IV fluids overnight   Hypertensive urgency BP finally controlled. -Continue amlodipine, valsartan, Lasix, carvedilol, clonidine   Acute on chronic diastolic CHF No hypoxia, respiratory distress.  Appears euvolemic.  Creatinine stable.   -Continue home Lasix -Continue daily weights, I/Os  Schizophrenia -Continue clozapine, Haldol -Consult to Psychiatry, appreciate cares -Continue Cogentin for now, discontinue at discharge -Continue mirtazapine  Degenerative disk disease MRI spine shows  bilateral severe foraminal narrowing.  She appears globally weak, however, not focally in her legs, and I do not think this DDD is causing her primary complaint (confusion) obviously, so will recommend outpaitent follow up with her orthopedist -Follow up MRI with outpatient pain specialist, PCP or surgeon  Leukocytosis Unclear cause.   Urinalysis and CXR suggest no infection.  Blood cultures negative.  MRI negative.  No other localizing source.  No fever.      DVT prophylaxis: Lovenox Code Status: FULL Family Communication: None present MDM and disposition Plan: The below labs and imaging reports were reviewed and summarized above.  The patient was admitted with encephalopathy of unclear cause, At baseline, she is oriented to self, interactive, able to live with intermittent supervision.  However here she is mostly unable to follow commands, unable to answer more than simple questions, intermittently disoriented still.  Overall she is becoming more interactive, and today is oriented to where she is, but still very weak.  We will get a PT evaluation, and determine disposition whether it is safe to go home or if she needs SNF.      Consultants:   Psychiatry  Procedures:   MRI brain - no stroke  CT head on admission unremakrable  Echocardiogram  ------------------------------------------------------------------- Study Conclusions  - Left ventricle: The cavity size was normal. Systolic function was   vigorous. The estimated ejection fraction was in the range of 65%   to 70%. Wall motion was normal; there were no regional wall   motion abnormalities. Doppler parameters are consistent with   abnormal left ventricular relaxation (grade 1 diastolic   dysfunction). - Mitral valve: Calcified annulus. - Pericardium, extracardiac: A small to moderate pericardial   effusion was identified posterior to the heart.  There was no   evidence of hemodynamic compromise.    MRI lumbar spine  7/31 -- negative, only degenerative disk disease  Antimicrobials:   None   Cultures:   7/29 Blood cultures x2     Subjective: Oriented to come in hospital and the year, but answers are slow and frequently she does not answer questions.  No new fever, cough, sputum.  No urinary symptoms.  No agitation or respiratory distress.  No vomiting.  Objective: Vitals:   11/19/17 2214 11/20/17 0554 11/20/17 0918 11/20/17 1200  BP: 130/69 136/61 135/72 (!) 131/58  Pulse: 92 71 85 64  Resp:  18  18  Temp: 98.8 F (37.1 C) 98.5 F (36.9 C)  97.7 F (36.5 C)  TempSrc: Oral Oral  Oral  SpO2: 95% 98% 100% 96%  Weight:  64.7 kg (142 lb 10.2 oz)    Height:        Intake/Output Summary (Last 24 hours) at 11/20/2017 1550 Last data filed at 11/20/2017 1351 Gross per 24 hour  Intake 360 ml  Output 851 ml  Net -491 ml   Filed Weights   11/18/17 0633 11/19/17 0422 11/20/17 0554  Weight: 66.1 kg (145 lb 11.6 oz) 64.8 kg (142 lb 13.7 oz) 64.7 kg (142 lb 10.2 oz)    Examination: General appearance: Thin elderly female, lying in bed, appears tired, no acute distress HEENT: Anicteric, conjunctival pink, lids and lashes normal.  No nasal deformity, discharge, or epistaxis.  Lips moist, dentures in place.  Oropharynx dry, no oral lesions, hearing normal. Skin: Warm and dry, no jaundice, no suspicious rashes or lesions  cardiac: Rate and rhythm, no murmurs, no lower extremity edema Respiratory: Normal respiratory rate and rhythm, lungs clear without rales or wheezes. Abdomen: Abdomen soft without tenderness to palpation, hepatosplenomegaly.   MSK: No deformities or effusions in large joints of upper or lower extremities bilaterally. Neuro: Awake and responsive to questions, but still psychomotor slowing is noted.  Extraocular movements intact, 5 out of 5 strength in upper extremities, 5- out of 5 strength in bilateral lower extremities.  Speech fluent. Psych: Sensorium intact and responding to  questions, attention diminished, affect blunted, judgment and insight appear impaired, no hallucinations, psychomotor slowing is noted.    Data Reviewed: I have personally reviewed following labs and imaging studies:  CBC: Recent Labs  Lab 11/15/17 1601 11/18/17 0525 11/19/17 0615  WBC 9.1 12.2* 12.8*  NEUTROABS 6.2  --   --   HGB 12.2 11.0* 12.0  HCT 39.9 35.1* 38.8  MCV 96.6 94.9 95.8  PLT 290 269 494   Basic Metabolic Panel: Recent Labs  Lab 11/15/17 1601 11/18/17 0525 11/19/17 0615 11/20/17 0401  NA 145 142 144 143  K 3.8 3.4* 4.5 4.9  CL 111 113* 113* 111  CO2 25 22 22 23   GLUCOSE 111* 124* 152* 189*  BUN 11 30* 25* 28*  CREATININE 0.94 1.01* 0.95 1.19*  CALCIUM 9.6 9.6 9.4 9.6   GFR: Estimated Creatinine Clearance: 34.7 mL/min (A) (by C-G formula based on SCr of 1.19 mg/dL (H)). Liver Function Tests: Recent Labs  Lab 11/15/17 1601 11/18/17 0525  AST 24 22  ALT 26 21  ALKPHOS 90 78  BILITOT 0.5 0.7  PROT 7.6 6.1*  ALBUMIN 3.7 3.0*   Recent Labs  Lab 11/15/17 1601  LIPASE 25   No results for input(s): AMMONIA in the last 168 hours. Coagulation Profile: No results for input(s): INR, PROTIME in the last 168 hours. Cardiac  Enzymes: No results for input(s): CKTOTAL, CKMB, CKMBINDEX, TROPONINI in the last 168 hours. BNP (last 3 results) No results for input(s): PROBNP in the last 8760 hours. HbA1C: No results for input(s): HGBA1C in the last 72 hours. CBG: Recent Labs  Lab 11/19/17 1144 11/19/17 1736 11/19/17 2211 11/20/17 0759 11/20/17 1116  GLUCAP 127* 161* 160* 132* 221*   Lipid Profile: No results for input(s): CHOL, HDL, LDLCALC, TRIG, CHOLHDL, LDLDIRECT in the last 72 hours. Thyroid Function Tests: No results for input(s): TSH, T4TOTAL, FREET4, T3FREE, THYROIDAB in the last 72 hours. Anemia Panel: No results for input(s): VITAMINB12, FOLATE, FERRITIN, TIBC, IRON, RETICCTPCT in the last 72 hours. Urine analysis:    Component Value  Date/Time   COLORURINE YELLOW 11/15/2017 1820   APPEARANCEUR HAZY (A) 11/15/2017 1820   LABSPEC 1.010 11/15/2017 1820   PHURINE 8.0 11/15/2017 1820   GLUCOSEU NEGATIVE 11/15/2017 1820   GLUCOSEU NEGATIVE 06/22/2008 0957   HGBUR NEGATIVE 11/15/2017 1820   HGBUR negative 12/12/2009 1102   BILIRUBINUR NEGATIVE 11/15/2017 1820   BILIRUBINUR small 11/09/2010 [place] 11/15/2017 1820   PROTEINUR NEGATIVE 11/15/2017 1820   UROBILINOGEN 1.0 12/12/2014 1100   NITRITE NEGATIVE 11/15/2017 1820   LEUKOCYTESUR NEGATIVE 11/15/2017 1820   Sepsis Labs: @LABRCNTIP (procalcitonin:4,lacticacidven:4)  ) Recent Results (from the past 240 hour(s))  MRSA PCR Screening     Status: None   Collection Time: 11/16/17 12:29 AM  Result Value Ref Range Status   MRSA by PCR NEGATIVE NEGATIVE Final    Comment:        The GeneXpert MRSA Assay (FDA approved for NASAL specimens only), is one component of a comprehensive MRSA colonization surveillance program. It is not intended to diagnose MRSA infection nor to guide or monitor treatment for MRSA infections. Performed at [facility], [place] [address] [place], [place] [address]   Culture, blood (Routine X 2) w Reflex to ID Panel     Status: None (Preliminary result)   Collection Time: 11/17/17  3:40 PM  Result Value Ref Range Status   Specimen Description BLOOD RIGHT HAND  Final   Special Requests   Final    BOTTLES DRAWN AEROBIC ONLY Blood Culture adequate volume   Culture   Final    NO GROWTH 3 DAYS Performed at [facility], [place] [address] [place], [place] [address]    Report Status PENDING  Incomplete  Culture, blood (Routine X 2) w Reflex to ID Panel     Status: None (Preliminary result)   Collection Time: 11/17/17  3:47 PM  Result Value Ref Range Status   Specimen Description BLOOD RIGHT HAND  Final   Special Requests   Final    BOTTLES DRAWN AEROBIC ONLY Blood Culture adequate volume   Culture   Final    NO  GROWTH 3 DAYS Performed at [facility], [place] [address], [place], [place] [address]    Report Status PENDING  Incomplete         Radiology Studies: Mr Lumbar Spine Wo Contrast  Result Date: 11/19/2017 CLINICAL DATA:  Back pain.  Generalized weakness. EXAM: MRI LUMBAR SPINE WITHOUT CONTRAST TECHNIQUE: Multiplanar, multisequence MR imaging of the lumbar spine was performed. No intravenous contrast was administered. COMPARISON:  Lumbar spine radiograph 01/22/2016 Lumbar spine MRI 01/02/2012 FINDINGS: Segmentation: Normal. The lowest disc space is considered to be L5-S1. Alignment:  Left convex scoliosis apex at L3. Vertebrae: No acute compression fracture, discitis-osteomyelitis of focal marrow lesion. Conus  medullaris and cauda equina: The conus medullaris terminates at the L2-3 level. The cauda equina and conus medullaris are both normal. Paraspinal and other soft tissues: The visualized aorta, IVC and iliac vessels are normal. The visualized retroperitoneal organs and paraspinal soft tissues are normal. Disc levels: T10-T12 is visualized in the sagittal plane only, with no disc herniation or stenosis. T12-L1: Minimal disc bulge without stenosis. L1-L2: Right eccentric small disc bulge. No spinal canal stenosis. Unchanged mild right foraminal stenosis. L2-L3: Right eccentric disc bulge and osteophyte. No spinal canal stenosis. Moderate right neural foraminal stenosis, unchanged. L3-L4: Medium-sized disc bulge with moderate facet hypertrophy. Severe right and moderate left neural foraminal stenosis, progressed from prior MRI. Mild spinal canal stenosis has slightly worsened. L4-L5: Moderate right and severe left facet hypertrophy. Small disc bulge. Severe left and mild right foraminal stenosis. There is mild spinal canal stenosis with left lateral recess narrowing, unchanged. The left neural foraminal stenosis has progressed. L5-S1: Preserved disc height. Severe facet arthrosis. Moderate left  foraminal stenosis with small left foraminal protrusion. This is unchanged. The visualized sacrum is normal. IMPRESSION: 1. No acute fracture or discitis-osteomyelitis of the lumbar spine. 2. Severe right, moderate left L3-4 neural foraminal stenosis, progressed since 01/02/2012. 3. Severe left L4-5 neural foraminal stenosis, also progressed. 4. Mild L3-4 and L4-5 spinal canal stenosis. 5. Left convex scoliosis with apex at L3. Electronically Signed   By: Ulyses Jarred M.D.   On: 11/19/2017 22:43        Scheduled Meds: . amLODipine  10 mg Oral Daily  . benztropine mesylate  1 mg Intravenous BID  . carvedilol  12.5 mg Oral BID WC  . cloNIDine  0.2 mg Transdermal Q Mon-1800  . cloZAPine  200 mg Oral QHS  . enoxaparin (LOVENOX) injection  40 mg Subcutaneous Q24H  . feeding supplement (ENSURE ENLIVE)  237 mL Oral TID BM  . furosemide  40 mg Oral Daily  . haloperidol  5 mg Oral QHS  . insulin aspart  0-9 Units Subcutaneous TID WC  . irbesartan  150 mg Oral Daily  . mirtazapine  45 mg Oral QHS  . multivitamin with minerals  1 tablet Oral Daily   Continuous Infusions: . sodium chloride       LOS: 4 days    Time spent: 25 minutes    Edwin Dada, MD [facility] 11/20/2017, 3:50 PM     Pager [phone] --- please page though AMION:  [email or web address] If 7PM-7AM, please contact night-coverage

## 2017-11-20 NOTE — Plan of Care (Signed)

## 2017-11-20 NOTE — Clinical Social Work Note (Addendum)
Clinical Social Work Assessment  Patient Details  Name: [name] MRN: [id number] Date of Birth: 1941-01-03  Date of referral:  11/20/17               Reason for consult:  Facility Placement, Discharge Planning                Permission sought to share information with:  Facility Sport and exercise psychologist, Family Supports Permission granted to share information::  Yes, Verbal Permission Granted  Name::     [name]  Agency::     Relationship::  Daughter  Contact Information:  [phone]  Housing/Transportation Living arrangements for the past 2 months:  Apartment Source of Information:  Patient, Medical Team Patient Interpreter Needed:  None Criminal Activity/Legal Involvement Pertinent to Current Situation/Hospitalization:  No - Comment as needed Significant Relationships:  Adult Children Lives with:  Self Do you feel safe going back to the place where you live?  Yes Need for family participation in patient care:  Yes (Comment)  Care giving concerns:  PT recommending SNF once medically stable for discharge.   Social Worker assessment / plan:  CSW met with patient. No supports at bedside. CSW introduced role and explained that PT recommendations would be discussed. Patient slow to respond but per RN, she is fully oriented. Patient does not want SNF placement. Discussed concern for safety at home alone and asked patient if I could contact one of her daughter to discuss PT recommendations. She gave permission to contact daughter [name] but stated she is likely at work. CSW called and left voicemail. No further concerns. CSW encouraged patient to contact CSW as needed. CSW will continue to follow patient for support and facilitate discharge to SNF, if agreeable, once medically stable.  4:24 pm: Received call back from patient's daughter. She is agreeable to patient returning home at discharge. She stated she believes patient's lack of eating has caused weakness but she ate more  today at lunch than she has in a while. Patient has a walker and rollator at home. She has an aide come to her home Monday-Friday for 3 1/2-4 hours. She is agreeable to home health through [facility]. RNCM notified. No further concerns. CSW signing off as social work intervention is no longer needed.   Employment status:  Retired Forensic scientist:  [employer] PT Recommendations:  [place] / Referral to community resources:  [place]  Patient/Family's Response to care: Patient refusing SNF. Will discuss with daughter, [name] Patient's daughters supportive and involved in patient's care. Patient appreciated social work intervention.  Patient/Family's Understanding of and Emotional Response to Diagnosis, Current Treatment, and Prognosis:  Patient has a good understanding of the reason for admission and social work consult. Patient appears pleased with hospital care but states she is weak because she has been in the hospital and she is ready to return home.  Emotional Assessment Appearance:  Appears stated age Attitude/Demeanor/Rapport:  Engaged Affect (typically observed):  Appropriate, Calm, Flat Orientation:  Oriented to Self, Oriented to Place, Oriented to  Time, Oriented to Situation Alcohol / Substance use:  Never Used Psych involvement (Current and /or in the community):  Outpatient Provider, Yes (Comment)(Psych evaluated for medication adjustments)  Discharge Needs  Concerns to be addressed:  Care Coordination Readmission within the last 30 days:  No Current discharge risk:  Dependent with Mobility, Lives alone Barriers to Discharge:  Continued Medical Work up   Candie Chroman, LCSW 11/20/2017, 4:07 PM

## 2017-11-21 ENCOUNTER — Encounter (HOSPITAL_COMMUNITY): Payer: Self-pay

## 2017-11-21 LAB — CBC
HCT: 39.4 % (ref 36.0–46.0)
Hemoglobin: 11.8 g/dL — ABNORMAL LOW (ref 12.0–15.0)
MCH: 29.9 pg (ref 26.0–34.0)
MCHC: 29.9 g/dL — AB (ref 30.0–36.0)
MCV: 99.7 fL (ref 78.0–100.0)
PLATELETS: 208 10*3/uL (ref 150–400)
RBC: 3.95 MIL/uL (ref 3.87–5.11)
RDW: 14.3 % (ref 11.5–15.5)
WBC: 12.4 10*3/uL — ABNORMAL HIGH (ref 4.0–10.5)

## 2017-11-21 LAB — BASIC METABOLIC PANEL
Anion gap: 10 (ref 5–15)
BUN: 24 mg/dL — AB (ref 8–23)
CALCIUM: 9.4 mg/dL (ref 8.9–10.3)
CO2: 22 mmol/L (ref 22–32)
Chloride: 110 mmol/L (ref 98–111)
Creatinine, Ser: 0.97 mg/dL (ref 0.44–1.00)
GFR calc Af Amer: >60 mL/min (ref >60)
GFR, EST NON AFRICAN AMERICAN: 55 mL/min — AB (ref >60)
GLUCOSE: 174 mg/dL — AB (ref 70–99)
Potassium: 4.7 mmol/L (ref 3.5–5.1)
Sodium: 142 mmol/L (ref 135–145)

## 2017-11-21 LAB — GLUCOSE, CAPILLARY
GLUCOSE-CAPILLARY: 157 mg/dL — AB (ref 70–99)
Glucose-Capillary: 192 mg/dL — ABNORMAL HIGH (ref 70–99)

## 2017-11-21 MED ORDER — CARVEDILOL 12.5 MG PO TABS: 12.5000 mg | ORAL_TABLET | Freq: Two times a day (BID) | ORAL | 0 refills | Status: DC

## 2017-11-21 MED ORDER — CLONIDINE 0.2 MG/24HR TD PTWK: 0.2000 mg | MEDICATED_PATCH | TRANSDERMAL | 12 refills | Status: DC

## 2017-11-21 NOTE — Care Management Note (Signed)
Case Management Note  Patient Details  Name: [name] MRN: [id number] Date of Birth: 09/07/1940  Subjective/Objective:    Paranoid Schizophrena               Action/Plan: CM talked to patient at the bedside, she gave CM permission to talk to her daughter [name] TCT Valerie call placed on speaker phone; patient wants to return home at discharge with [facility] services; she has a personal care service that provides care for 3 hrs 5 days a week; [place] choice offered, [name] chose [facility]; referral made as requested; TCT Tiffany with [facility], they have a Psych Program that the patient would benefit from. DME - rollater, walker and bedside commode.  Expected Discharge Date:  Possibly 11/21/2017              Expected Discharge Plan:  [place]  Discharge planning Services  CM Consult  Choice offered to:  Adult Children  HH Arranged:  RN, PT HH Agency:  [facility] ([facility]  Status of Service:  In process, will continue to follow  Sherrilyn Rist [phone] 11/21/2017, 10:32 AM

## 2017-11-21 NOTE — Progress Notes (Signed)
Discussed discharge instruction with patient and family, including follow up with home health and PCP and medications.  Patient did not have any questions at this time.  Removed IV - site clean dry and intact- pressure bandage placed.

## 2017-11-21 NOTE — Discharge Summary (Signed)
Physician Discharge Summary  [name] YNW:[id number] DOB: 22-May-1940 DOA: 11/15/2017  PCP: Jane Amel, MD  Admit date: 11/15/2017 Discharge date: 11/21/2017  Admitted From: Home  Disposition:  Home   Recommendations for Outpatient Follow-up:  1. Follow up with PCP in 1-2 weeks 2. Please obtain BMP in one week to check Creatinine after restarting BP medicines 3. Home-health: Please obtain BP this week to monitor on new antihypertensives 4. Please obtain CBC in one week: to re-evaluate hemoglobin     Home Health: Yes  Equipment/Devices: TBD  Discharge Condition: Fair  CODE STATUS: FULL Diet recommendation: Cardiac  Brief/Interim Summary: [name] is a 77 y.o. F with mild dementia, community dwelling, paranoid schizophrenia, HTN, and DM who presented with generalized weakness and hallucinations and new confusion.  Apparently, at baseline, patient is able to live alone with intermittent supervision only.  She initially reported some vague/nonspecific complaints of generalized weakness, dizziness and diarrhea to EDP.  On evaluation by admitting MD, she was delirious, "talking unrelated things", confused, but overall nonfocal.  Admitted, started on IV Haldol q6H and worsened.  Then Haldol stopped, started on Cogentin given concern for EPS/parkinsonism.          Discharge Diagnoses:   Acute metabolic encephalopathy Etiology not entirely clear.  CT and MRI brain unremarkable.  Urine and CXR clear, blood cultures negative, no fever during 4 days hospitalization.  MRI spine for occult infection (given increased back pain) was pursued but was negative, showed only severe foraminal disease.    The differential at this time probably still only includes dehydration, baclofen reaction (patient had taken 2 doses of a new medicine baclofen, just prior to symptom onset), or hypertensive encephalopathy.  Large doses of IV Haldol on hospital day 1 confounded the picture by making her  very sedated.  Psychiatry evaluated patient in person, did not suspect EPS, do not suspect psychosis from schizophrenia as the cause of her symptoms.    In the last 2 days, she has improved near to baseline.  SNF was recommended but family and patient refused, home health was arranged.    Hypertensive urgency BP initially >200/100.  Clonidine and carvedilol were added.  BP finally controlled.    Acute on chronic diastolic CHF Diuresed initially, then resumed on home oral Lasix.   Creatinine stable.  Schizophrenia Initially treated with higher doses IV Haldol, then resulting in sedation.  Brief concern for parkinsonism/EPS as cause of her symptoms, but evaluated by Psychiatry who felt this unlikely.  No change to regimen at discharge, follow up with outpatient Psych.  Degenerative disk disease MRI spine shows bilateral severe foraminal narrowing.  She appears globally weak, however, not focally in her legs, and I do not think this DDD is causing her primary complaint (confusion) obviously, so will recommend outpaitent follow up with her orthopedist -Follow up MRI with outpatient pain specialist, PCP or surgeon  Leukocytosis Unclear cause.   Urinalysis and CXR suggest no infection.  Blood cultures negative.  MRI negative.  No other localizing source.  No fever.          Discharge Instructions  Discharge Instructions    Diet - low sodium heart healthy   Complete by:  As directed    Discharge instructions   Complete by:  As directed    From [name]: You were admitted for confusion. We ruled out that this was from stroke or infection while you were here. At first, it seemed like this was maybe psychosis, and so  you were treated with strong doses of Haldol, but this actually seemed to make things worse, so that was held for a few days. In the end, I suspect this was a combination of dehydration, that new medicine baclofen, and high blood pressure affecting the  brain.  Work with PT and OT to get stronger at home. Follow up with [name] (your primary care) within 1 week.  Call them for an appointment today.  Ask them about the MRI findings, and if you don't get better strength in your legs within a month, or if your legs get worse, call your orthopedic surgeon's office.  For blood pressure: Take two new medicines: Carvedilol twice daily Clonidine patch (apply new one weekly)  Have the home health agency check your blood pressure three times a week, and call your primary care doctors office if the blood pressure is less than 100/60 mmHg. Continue taking your regular furosemide, amlodipine and valsartan   Resume all your normal medicines like Clozaril, Haldol, mirtazapine/Remeron, donepezil and memantine  Avoid baclofen   Increase activity slowly   Complete by:  As directed      Allergies as of 11/21/2017      Reactions   Codeine Phosphate Other (See Comments)   Unknown reaction   Dexilant [dexlansoprazole] Other (See Comments)   weakness   Glimepiride Other (See Comments)   Unknown reaction   Glyburide Other (See Comments)   Unknown reaction   Levofloxacin Nausea Only   Made her weak and nauseated   Shellfish Allergy Swelling   Lip swelling   Tramadol Other (See Comments)   Unknown reaction      Medication List    TAKE these medications   ACETAMINOPHEN-PAMABROM PO Take 1 tablet by mouth 2 (two) times daily. "Back Aid"   ALKA-SELTZER PLUS COLD PO Take 1 tablet by mouth 2 (two) times daily.   amLODipine 10 MG tablet Commonly known as:  NORVASC Take 10 mg by mouth daily.   atorvastatin 40 MG tablet Commonly known as:  LIPITOR Take 40 mg by mouth daily.   carvedilol 12.5 MG tablet Commonly known as:  COREG Take 1 tablet (12.5 mg total) by mouth 2 (two) times daily with a meal.   cloNIDine 0.2 mg/24hr patch Commonly known as:  CATAPRES - Dosed in mg/24 hr Place 1 patch (0.2 mg total) onto the skin every Monday at 6  PM. Start taking on:  11/24/2017   cloZAPine 100 MG tablet Commonly known as:  CLOZARIL Take 1 tablet (100 mg total) by mouth at bedtime. What changed:  how much to take   donepezil 10 MG tablet Commonly known as:  ARICEPT Take 10 mg by mouth at bedtime.   esomeprazole 40 MG capsule Commonly known as:  NEXIUM Take 40 mg by mouth 2 (two) times daily.   feeding supplement (GLUCERNA SHAKE) Liqd Take 237 mLs by mouth 3 (three) times daily between meals.   furosemide 40 MG tablet Commonly known as:  LASIX Take 40 mg by mouth daily.   gabapentin 100 MG capsule Commonly known as:  NEURONTIN Take 100 mg by mouth at bedtime as needed (pain).   haloperidol 5 MG tablet Commonly known as:  HALDOL Take 5-10 mg by mouth See admin instructions. Take one tablet (5 mg) by mouth every morning and two tablets (10 mg) at bedtime as tolerated   HYDROcodone-acetaminophen 5-325 MG tablet Commonly known as:  NORCO/VICODIN Take 1-2 tablets by mouth every 6 (six) hours as needed for moderate  pain.   insulin glargine 100 UNIT/ML injection Commonly known as:  LANTUS Inject 15 Units into the skin at bedtime.   memantine 10 MG tablet Commonly known as:  NAMENDA Take 1 tablet (10 mg total) by mouth 2 (two) times daily.   mirtazapine 45 MG tablet Commonly known as:  REMERON Take 45 mg by mouth at bedtime.   potassium chloride 8 MEQ tablet Commonly known as:  KLOR-CON Take 1 tablet (8 mEq total) by mouth daily. What changed:  how much to take   ranitidine 150 MG capsule Commonly known as:  ZANTAC Take 150 mg by mouth 2 (two) times daily.   valsartan 160 MG tablet Commonly known as:  DIOVAN Take 160 mg by mouth daily.      Follow-up Information    [facility] Follow up.   Specialty:  Home Health Services Why:  They will do your home health care at your home Contact information: [place] [address] [phone]        Jane Amel, MD On 12/05/2017.    Specialty:  Family Medicine Why:  @11 :30 am Contact information: [address] [place] [address] [phone]          Allergies  Allergen Reactions  . Codeine Phosphate Other (See Comments)    Unknown reaction  . Dexilant [Dexlansoprazole] Other (See Comments)    weakness  . Glimepiride Other (See Comments)    Unknown reaction  . Glyburide Other (See Comments)    Unknown reaction  . Levofloxacin Nausea Only    Made her weak and nauseated  . Shellfish Allergy Swelling    Lip swelling  . Tramadol Other (See Comments)    Unknown reaction    Consultations:  Psychiatry   Procedures/Studies: Ct Head Wo Contrast  Result Date: 11/15/2017 CLINICAL DATA:  Altered level of consciousness EXAM: CT HEAD WITHOUT CONTRAST TECHNIQUE: Contiguous axial images were obtained from the base of the skull through the vertex without intravenous contrast. COMPARISON:  11/01/2015 FINDINGS: Brain: Age related volume loss. No acute intracranial abnormality. Specifically, no hemorrhage, hydrocephalus, mass lesion, acute infarction, or significant intracranial injury. Vascular: No hyperdense vessel or unexpected calcification. Skull: No acute calvarial abnormality. Sinuses/Orbits: Visualized paranasal sinuses and mastoids clear. Orbital soft tissues unremarkable. Other: None IMPRESSION: No acute intracranial abnormality. Electronically Signed   By: Rolm Baptise M.D.   On: 11/15/2017 20:56   Mr Brain Wo Contrast  Result Date: 11/17/2017 CLINICAL DATA:  Dizziness, confusion, and generalized weakness. EXAM: MRI HEAD WITHOUT CONTRAST TECHNIQUE: Multiplanar, multiecho pulse sequences of the brain and surrounding structures were obtained without intravenous contrast. COMPARISON:  Head CT 11/15/2017 FINDINGS: Brain: There is no evidence of acute infarct, mass, midline shift, or extra-axial fluid collection. Chronic microhemorrhages are noted in the thalami, cerebellum, and scattered in  both cerebral hemispheres, possibly reflecting chronic hypertension. Chronic lacunar infarcts are noted in the basal ganglia, thalami, cerebral white matter, pons, and left cerebellum. There is mild-to-moderate cerebral atrophy. Vascular: Major intracranial vascular flow voids are preserved. Skull and upper cervical spine: Unremarkable bone marrow signal. Sinuses/Orbits: Unremarkable orbits. Paranasal sinuses and mastoid air cells are clear. Other: None. IMPRESSION: 1. No acute intracranial abnormality. 2. 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 Electronically Signed   By: Logan Bores M.D.   On: 11/17/2017 12:35   Mr Lumbar Spine Wo Contrast  Result Date: 11/19/2017 CLINICAL DATA:  Back pain.  Generalized weakness. EXAM: MRI  LUMBAR SPINE WITHOUT CONTRAST TECHNIQUE: Multiplanar, multisequence MR imaging of the lumbar spine was performed. No intravenous contrast was administered. COMPARISON:  Lumbar spine radiograph 01/22/2016 Lumbar spine MRI 01/02/2012 FINDINGS: Segmentation: Normal. The lowest disc space is considered to be L5-S1. Alignment:  Left convex scoliosis apex at L3. Vertebrae: No acute compression fracture, discitis-osteomyelitis of focal marrow lesion. Conus medullaris and cauda equina: The conus medullaris terminates at the L2-3 level. The cauda equina and conus medullaris are both normal. Paraspinal and other soft tissues: The visualized aorta, IVC and iliac vessels are normal. The visualized retroperitoneal organs and paraspinal soft tissues are normal. Disc levels: T10-T12 is visualized in the sagittal plane only, with no disc herniation or stenosis. T12-L1: Minimal disc bulge without stenosis. L1-L2: Right eccentric small disc bulge. No spinal canal stenosis. Unchanged mild right foraminal stenosis. L2-L3: Right eccentric disc bulge and osteophyte. No spinal canal stenosis. Moderate right neural foraminal stenosis, unchanged. L3-L4: Medium-sized disc bulge  with moderate facet hypertrophy. Severe right and moderate left neural foraminal stenosis, progressed from prior MRI. Mild spinal canal stenosis has slightly worsened. L4-L5: Moderate right and severe left facet hypertrophy. Small disc bulge. Severe left and mild right foraminal stenosis. There is mild spinal canal stenosis with left lateral recess narrowing, unchanged. The left neural foraminal stenosis has progressed. L5-S1: Preserved disc height. Severe facet arthrosis. Moderate left foraminal stenosis with small left foraminal protrusion. This is unchanged. The visualized sacrum is normal. IMPRESSION: 1. No acute fracture or discitis-osteomyelitis of the lumbar spine. 2. Severe right, moderate left L3-4 neural foraminal stenosis, progressed since 01/02/2012. 3. Severe left L4-5 neural foraminal stenosis, also progressed. 4. Mild L3-4 and L4-5 spinal canal stenosis. 5. Left convex scoliosis with apex at L3. Electronically Signed   By: Ulyses Jarred M.D.   On: 11/19/2017 22:43   Dg Chest Port 1 View  Result Date: 11/18/2017 CLINICAL DATA:  Follow-up exam EXAM: PORTABLE CHEST 1 VIEW COMPARISON:  11/16/2017 FINDINGS: Cardiomegaly with vascular congestion. Interstitial prominence could reflect early interstitial edema. Elevation of the right hemidiaphragm, stable. No confluent opacities or visible effusions. IMPRESSION: Cardiomegaly with vascular congestion and suspected early interstitial edema. Stable elevation of the right hemidiaphragm. Electronically Signed   By: Rolm Baptise M.D.   On: 11/18/2017 09:20   Dg Chest Port 1 View  Result Date: 11/16/2017 CLINICAL DATA:  Altered mental status EXAM: PORTABLE CHEST 1 VIEW COMPARISON:  07/19/2017 FINDINGS: Low volume chest which accentuates chronic interstitial coarsening. Normal heart size for technique. Negative aortic and hilar contours. Postoperative left breast. IMPRESSION: Low volume chest without acute finding. Electronically Signed   By: Monte Fantasia  M.D.   On: 11/16/2017 17:08       Subjective: Overall tired, very globally weak.  Possibly more weak in legs.  Able to sit up today, stronger.  Mentation more clear today, oriented to family, place, time.  No fever, cough, sputum, dysuria, change in back pain, no new abdominal pain.  Discharge Exam: Vitals:   11/21/17 1112 11/21/17 1145  BP: (!) 161/76   Pulse: 75   Resp:    Temp:  98.4 F (36.9 C)  SpO2: 96%    Vitals:   11/20/17 1953 11/21/17 0354 11/21/17 1112 11/21/17 1145  BP: (!) 134/52 (!) 144/62 (!) 161/76   Pulse: 66 77 75   Resp: 20 20    Temp: 98 F (36.7 C) (!) 97.5 F (36.4 C)  98.4 F (36.9 C)  TempSrc: Oral Oral  Oral  SpO2: 96%  100% 96%   Weight:  64 kg (141 lb 1.5 oz)    Height:        General: Pt is awake, tired, not in acute distress, lying in bed, later sitting on side of bed Cardiovascular: RRR, S1/S2 +, no rubs, no gallops Respiratory: CTA bilaterally, no wheezing, no rhonchi Abdominal: Soft, NT, ND, bowel sounds + Extremities: no edema, no cyanosis    The results of significant diagnostics from this hospitalization (including imaging, microbiology, ancillary and laboratory) are listed below for reference.     Microbiology: Recent Results (from the past 240 hour(s))  MRSA PCR Screening     Status: None   Collection Time: 11/16/17 12:29 AM  Result Value Ref Range Status   MRSA by PCR NEGATIVE NEGATIVE Final    Comment:        The GeneXpert MRSA Assay (FDA approved for NASAL specimens only), is one component of a comprehensive MRSA colonization surveillance program. It is not intended to diagnose MRSA infection nor to guide or monitor treatment for MRSA infections. Performed at [facility], [place] [address] [place], [place] [address]   Culture, blood (Routine X 2) w Reflex to ID Panel     Status: None (Preliminary result)   Collection Time: 11/17/17  3:40 PM  Result Value Ref Range Status   Specimen Description BLOOD RIGHT  HAND  Final   Special Requests   Final    BOTTLES DRAWN AEROBIC ONLY Blood Culture adequate volume   Culture   Final    NO GROWTH 4 DAYS Performed at [facility], [place] [address], [place], [place] [address]    Report Status PENDING  Incomplete  Culture, blood (Routine X 2) w Reflex to ID Panel     Status: None (Preliminary result)   Collection Time: 11/17/17  3:47 PM  Result Value Ref Range Status   Specimen Description BLOOD RIGHT HAND  Final   Special Requests   Final    BOTTLES DRAWN AEROBIC ONLY Blood Culture adequate volume   Culture   Final    NO GROWTH 4 DAYS Performed at [facility], [place] [address], [place], [place] [address]    Report Status PENDING  Incomplete     Labs: BNP (last 3 results) No results for input(s): BNP in the last 8760 hours. Basic Metabolic Panel: Recent Labs  Lab 11/15/17 1601 11/18/17 0525 11/19/17 0615 11/20/17 0401 11/21/17 0314  NA 145 142 144 143 142  K 3.8 3.4* 4.5 4.9 4.7  CL 111 113* 113* 111 110  CO2 25 22 22 23 22   GLUCOSE 111* 124* 152* 189* 174*  BUN 11 30* 25* 28* 24*  CREATININE 0.94 1.01* 0.95 1.19* 0.97  CALCIUM 9.6 9.6 9.4 9.6 9.4   Liver Function Tests: Recent Labs  Lab 11/15/17 1601 11/18/17 0525  AST 24 22  ALT 26 21  ALKPHOS 90 78  BILITOT 0.5 0.7  PROT 7.6 6.1*  ALBUMIN 3.7 3.0*   Recent Labs  Lab 11/15/17 1601  LIPASE 25   No results for input(s): AMMONIA in the last 168 hours. CBC: Recent Labs  Lab 11/15/17 1601 11/18/17 0525 11/19/17 0615 11/21/17 0314  WBC 9.1 12.2* 12.8* 12.4*  NEUTROABS 6.2  --   --   --   HGB 12.2 11.0* 12.0 11.8*  HCT 39.9 35.1* 38.8 39.4  MCV 96.6 94.9 95.8 99.7  PLT 290 269 230 208   Cardiac Enzymes: No results for input(s): CKTOTAL,  CKMB, CKMBINDEX, TROPONINI in the last 168 hours. BNP: Invalid input(s): POCBNP CBG: Recent Labs  Lab 11/20/17 1116 11/20/17 1628 11/20/17 2052 11/21/17 0749 11/21/17 1205  GLUCAP 221* 126* 189* 157* 192*    D-Dimer No results for input(s): DDIMER in the last 72 hours. Hgb A1c No results for input(s): HGBA1C in the last 72 hours. Lipid Profile No results for input(s): CHOL, HDL, LDLCALC, TRIG, CHOLHDL, LDLDIRECT in the last 72 hours. Thyroid function studies No results for input(s): TSH, T4TOTAL, T3FREE, THYROIDAB in the last 72 hours.  Invalid input(s): FREET3 Anemia work up No results for input(s): VITAMINB12, FOLATE, FERRITIN, TIBC, IRON, RETICCTPCT in the last 72 hours. Urinalysis    Component Value Date/Time   COLORURINE YELLOW 11/15/2017 1820   APPEARANCEUR HAZY (A) 11/15/2017 1820   LABSPEC 1.010 11/15/2017 1820   PHURINE 8.0 11/15/2017 1820   GLUCOSEU NEGATIVE 11/15/2017 1820   GLUCOSEU NEGATIVE 06/22/2008 0957   HGBUR NEGATIVE 11/15/2017 1820   HGBUR negative 12/12/2009 1102   BILIRUBINUR NEGATIVE 11/15/2017 1820   BILIRUBINUR small 11/09/2010 1155   KETONESUR NEGATIVE 11/15/2017 1820   PROTEINUR NEGATIVE 11/15/2017 1820   UROBILINOGEN 1.0 12/12/2014 1100   NITRITE NEGATIVE 11/15/2017 1820   LEUKOCYTESUR NEGATIVE 11/15/2017 1820   Sepsis Labs Invalid input(s): PROCALCITONIN,  WBC,  LACTICIDVEN Microbiology Recent Results (from the past 240 hour(s))  MRSA PCR Screening     Status: None   Collection Time: 11/16/17 12:29 AM  Result Value Ref Range Status   MRSA by PCR NEGATIVE NEGATIVE Final    Comment:        The GeneXpert MRSA Assay (FDA approved for NASAL specimens only), is one component of a comprehensive MRSA colonization surveillance program. It is not intended to diagnose MRSA infection nor to guide or monitor treatment for MRSA infections. Performed at [facility], [place] [address] [place], [place] [address]   Culture, blood (Routine X 2) w Reflex to ID Panel     Status: None (Preliminary result)   Collection Time: 11/17/17  3:40 PM  Result Value Ref Range Status   Specimen Description BLOOD RIGHT HAND  Final   Special Requests   Final     BOTTLES DRAWN AEROBIC ONLY Blood Culture adequate volume   Culture   Final    NO GROWTH 4 DAYS Performed at [facility], [place] [address] [place], [place] [address]    Report Status PENDING  Incomplete  Culture, blood (Routine X 2) w Reflex to ID Panel     Status: None (Preliminary result)   Collection Time: 11/17/17  3:47 PM  Result Value Ref Range Status   Specimen Description BLOOD RIGHT HAND  Final   Special Requests   Final    BOTTLES DRAWN AEROBIC ONLY Blood Culture adequate volume   Culture   Final    NO GROWTH 4 DAYS Performed at [facility], [place] [address], [place], [place] [address]    Report Status PENDING  Incomplete     Time coordinating discharge: 40 minutes       SIGNED:   Edwin Dada, MD  [facility] 11/21/2017, 7:54 PM

## 2017-11-22 ENCOUNTER — Encounter (HOSPITAL_COMMUNITY): Payer: Self-pay

## 2017-11-22 ENCOUNTER — Inpatient Hospital Stay (HOSPITAL_COMMUNITY)
Admission: EM | Admit: 2017-11-22 | Discharge: 2017-11-27 | DRG: 871 | Disposition: A | Discharge status: Skilled Nursing Facility | Payer: Medicare Other | Attending: Internal Medicine | Admitting: Internal Medicine

## 2017-11-22 ENCOUNTER — Other Ambulatory Visit: Payer: Self-pay

## 2017-11-22 ENCOUNTER — Emergency Department (HOSPITAL_COMMUNITY): Payer: Medicare Other

## 2017-11-22 DIAGNOSIS — N3000 Acute cystitis without hematuria: Secondary | ICD-10-CM | POA: Diagnosis not present

## 2017-11-22 DIAGNOSIS — Z885 Allergy status to narcotic agent status: Secondary | ICD-10-CM

## 2017-11-22 DIAGNOSIS — R079 Chest pain, unspecified: Secondary | ICD-10-CM | POA: Diagnosis not present

## 2017-11-22 DIAGNOSIS — N12 Tubulo-interstitial nephritis, not specified as acute or chronic: Secondary | ICD-10-CM | POA: Diagnosis present

## 2017-11-22 DIAGNOSIS — F200 Paranoid schizophrenia: Secondary | ICD-10-CM | POA: Diagnosis present

## 2017-11-22 DIAGNOSIS — G9341 Metabolic encephalopathy: Secondary | ICD-10-CM | POA: Diagnosis present

## 2017-11-22 DIAGNOSIS — M6282 Rhabdomyolysis: Secondary | ICD-10-CM | POA: Diagnosis present

## 2017-11-22 DIAGNOSIS — Z881 Allergy status to other antibiotic agents status: Secondary | ICD-10-CM

## 2017-11-22 DIAGNOSIS — A419 Sepsis, unspecified organism: Secondary | ICD-10-CM | POA: Diagnosis not present

## 2017-11-22 DIAGNOSIS — Y92009 Unspecified place in unspecified non-institutional (private) residence as the place of occurrence of the external cause: Secondary | ICD-10-CM

## 2017-11-22 DIAGNOSIS — R410 Disorientation, unspecified: Secondary | ICD-10-CM | POA: Diagnosis not present

## 2017-11-22 DIAGNOSIS — E876 Hypokalemia: Secondary | ICD-10-CM | POA: Diagnosis present

## 2017-11-22 DIAGNOSIS — E785 Hyperlipidemia, unspecified: Secondary | ICD-10-CM | POA: Diagnosis present

## 2017-11-22 DIAGNOSIS — Z8249 Family history of ischemic heart disease and other diseases of the circulatory system: Secondary | ICD-10-CM

## 2017-11-22 DIAGNOSIS — W19XXXA Unspecified fall, initial encounter: Secondary | ICD-10-CM | POA: Diagnosis present

## 2017-11-22 DIAGNOSIS — Z888 Allergy status to other drugs, medicaments and biological substances status: Secondary | ICD-10-CM

## 2017-11-22 DIAGNOSIS — I5032 Chronic diastolic (congestive) heart failure: Secondary | ICD-10-CM | POA: Diagnosis not present

## 2017-11-22 DIAGNOSIS — E1151 Type 2 diabetes mellitus with diabetic peripheral angiopathy without gangrene: Secondary | ICD-10-CM | POA: Diagnosis present

## 2017-11-22 DIAGNOSIS — R1111 Vomiting without nausea: Secondary | ICD-10-CM | POA: Diagnosis not present

## 2017-11-22 DIAGNOSIS — M549 Dorsalgia, unspecified: Secondary | ICD-10-CM

## 2017-11-22 DIAGNOSIS — R6510 Systemic inflammatory response syndrome (SIRS) of non-infectious origin without acute organ dysfunction: Secondary | ICD-10-CM | POA: Diagnosis present

## 2017-11-22 DIAGNOSIS — Z87891 Personal history of nicotine dependence: Secondary | ICD-10-CM

## 2017-11-22 DIAGNOSIS — I10 Essential (primary) hypertension: Secondary | ICD-10-CM | POA: Diagnosis present

## 2017-11-22 DIAGNOSIS — E860 Dehydration: Secondary | ICD-10-CM | POA: Diagnosis present

## 2017-11-22 DIAGNOSIS — Z853 Personal history of malignant neoplasm of breast: Secondary | ICD-10-CM

## 2017-11-22 DIAGNOSIS — H6692 Otitis media, unspecified, left ear: Secondary | ICD-10-CM | POA: Diagnosis present

## 2017-11-22 DIAGNOSIS — Z90710 Acquired absence of both cervix and uterus: Secondary | ICD-10-CM

## 2017-11-22 DIAGNOSIS — K589 Irritable bowel syndrome without diarrhea: Secondary | ICD-10-CM | POA: Diagnosis present

## 2017-11-22 DIAGNOSIS — E119 Type 2 diabetes mellitus without complications: Secondary | ICD-10-CM

## 2017-11-22 DIAGNOSIS — D509 Iron deficiency anemia, unspecified: Secondary | ICD-10-CM | POA: Diagnosis present

## 2017-11-22 DIAGNOSIS — Z833 Family history of diabetes mellitus: Secondary | ICD-10-CM

## 2017-11-22 DIAGNOSIS — R531 Weakness: Secondary | ICD-10-CM | POA: Diagnosis not present

## 2017-11-22 DIAGNOSIS — R4182 Altered mental status, unspecified: Secondary | ICD-10-CM | POA: Diagnosis not present

## 2017-11-22 DIAGNOSIS — R52 Pain, unspecified: Secondary | ICD-10-CM

## 2017-11-22 DIAGNOSIS — K219 Gastro-esophageal reflux disease without esophagitis: Secondary | ICD-10-CM | POA: Diagnosis present

## 2017-11-22 DIAGNOSIS — I2510 Atherosclerotic heart disease of native coronary artery without angina pectoris: Secondary | ICD-10-CM | POA: Diagnosis present

## 2017-11-22 DIAGNOSIS — G8929 Other chronic pain: Secondary | ICD-10-CM | POA: Diagnosis present

## 2017-11-22 DIAGNOSIS — Z794 Long term (current) use of insulin: Secondary | ICD-10-CM

## 2017-11-22 DIAGNOSIS — F0390 Unspecified dementia without behavioral disturbance: Secondary | ICD-10-CM | POA: Diagnosis present

## 2017-11-22 DIAGNOSIS — Z91013 Allergy to seafood: Secondary | ICD-10-CM

## 2017-11-22 DIAGNOSIS — K224 Dyskinesia of esophagus: Secondary | ICD-10-CM | POA: Diagnosis present

## 2017-11-22 DIAGNOSIS — I110 Hypertensive heart disease with heart failure: Secondary | ICD-10-CM | POA: Diagnosis present

## 2017-11-22 DIAGNOSIS — I160 Hypertensive urgency: Secondary | ICD-10-CM | POA: Diagnosis present

## 2017-11-22 LAB — CULTURE, BLOOD (ROUTINE X 2)
CULTURE: NO GROWTH
Culture: NO GROWTH
SPECIAL REQUESTS: ADEQUATE
Special Requests: ADEQUATE

## 2017-11-22 LAB — URINALYSIS, ROUTINE W REFLEX MICROSCOPIC
Bilirubin Urine: NEGATIVE
GLUCOSE, UA: NEGATIVE mg/dL
KETONES UR: NEGATIVE mg/dL
Leukocytes, UA: NEGATIVE
NITRITE: NEGATIVE
PROTEIN: NEGATIVE mg/dL
Specific Gravity, Urine: 1.012 (ref 1.005–1.030)
pH: 6 (ref 5.0–8.0)

## 2017-11-22 LAB — RAPID URINE DRUG SCREEN, HOSP PERFORMED
Amphetamines: NOT DETECTED
BARBITURATES: NOT DETECTED
Benzodiazepines: NOT DETECTED
COCAINE: NOT DETECTED
Opiates: NOT DETECTED
Tetrahydrocannabinol: NOT DETECTED

## 2017-11-22 LAB — COMPREHENSIVE METABOLIC PANEL
ALK PHOS: 73 U/L (ref 38–126)
ALT: 27 U/L (ref 0–44)
ANION GAP: 10 (ref 5–15)
AST: 34 U/L (ref 15–41)
Albumin: 3 g/dL — ABNORMAL LOW (ref 3.5–5.0)
BILIRUBIN TOTAL: 0.6 mg/dL (ref 0.3–1.2)
BUN: 22 mg/dL (ref 8–23)
CALCIUM: 9.6 mg/dL (ref 8.9–10.3)
CO2: 24 mmol/L (ref 22–32)
Chloride: 108 mmol/L (ref 98–111)
Creatinine, Ser: 0.94 mg/dL (ref 0.44–1.00)
GFR calc Af Amer: >60 mL/min (ref >60)
GFR, EST NON AFRICAN AMERICAN: 57 mL/min — AB (ref >60)
Glucose, Bld: 186 mg/dL — ABNORMAL HIGH (ref 70–99)
Potassium: 3.7 mmol/L (ref 3.5–5.1)
Sodium: 142 mmol/L (ref 135–145)
TOTAL PROTEIN: 6.7 g/dL (ref 6.5–8.1)

## 2017-11-22 LAB — CBC WITH DIFFERENTIAL/PLATELET
Abs Immature Granulocytes: 0 10*3/uL (ref 0.0–0.1)
BASOS ABS: 0 10*3/uL (ref 0.0–0.1)
BASOS PCT: 0 %
EOS PCT: 0 %
Eosinophils Absolute: 0.1 10*3/uL (ref 0.0–0.7)
HCT: 34.1 % — ABNORMAL LOW (ref 36.0–46.0)
Hemoglobin: 10.4 g/dL — ABNORMAL LOW (ref 12.0–15.0)
Immature Granulocytes: 0 %
Lymphocytes Relative: 14 %
Lymphs Abs: 2.2 10*3/uL (ref 0.7–4.0)
MCH: 29.3 pg (ref 26.0–34.0)
MCHC: 30.5 g/dL (ref 30.0–36.0)
MCV: 96.1 fL (ref 78.0–100.0)
Monocytes Absolute: 1.2 10*3/uL — ABNORMAL HIGH (ref 0.1–1.0)
Monocytes Relative: 8 %
Neutro Abs: 11.7 10*3/uL — ABNORMAL HIGH (ref 1.7–7.7)
Neutrophils Relative %: 78 %
PLATELETS: 248 10*3/uL (ref 150–400)
RBC: 3.55 MIL/uL — AB (ref 3.87–5.11)
RDW: 13.7 % (ref 11.5–15.5)
WBC: 15.2 10*3/uL — AB (ref 4.0–10.5)

## 2017-11-22 LAB — CK: Total CK: 784 U/L — ABNORMAL HIGH (ref 38–234)

## 2017-11-22 LAB — TROPONIN I: Troponin I: <0.03 ng/mL (ref <0.03)

## 2017-11-22 LAB — ETHANOL

## 2017-11-22 MED ORDER — LACTATED RINGERS IV BOLUS
1000.0000 mL | Freq: Once | INTRAVENOUS | Status: AC
  Administered 2017-11-22: 1000 mL via INTRAVENOUS

## 2017-11-22 MED ORDER — SODIUM CHLORIDE 0.9 % IV SOLN
2.0000 g | Freq: Once | INTRAVENOUS | Status: AC
  Administered 2017-11-22: 2 g via INTRAVENOUS
  Filled 2017-11-22: qty 20

## 2017-11-22 MED ORDER — HYDRALAZINE HCL 20 MG/ML IJ SOLN
10.0000 mg | INTRAMUSCULAR | Status: DC | PRN
  Administered 2017-11-24: 10 mg via INTRAVENOUS
  Filled 2017-11-22: qty 1

## 2017-11-22 NOTE — ED Notes (Signed)
Pt refused blood draw,   Keeps moving arm away. I will notify nurse.

## 2017-11-22 NOTE — ED Notes (Signed)
Patient placed on bedpan.

## 2017-11-22 NOTE — ED Notes (Signed)
Pt placed on bedpan

## 2017-11-22 NOTE — ED Provider Notes (Signed)
Transfer of Care Note I assumed care of [facility] on 11/22/2017   Briefly, Katreena Schupp is a 77 y.o. female who:  Lives alone  Found down in vomit today  Recently discharged  Schizophrenia  The plan includes:  F/u labs  F/u head CT  Admit (recently discharged for a metabolic encephalopathy)   Please refer to the original provider's note for additional information regarding the care of [facility].  ### Reassessment: I personally reassessed the patient:  Vital Signs:  The most current vitals were:   Vitals:   11/22/17 2257 11/22/17 2300  BP:  (!) 183/72  Pulse:  82  Resp:  16  Temp: (!) 101.4 F (38.6 C)   SpO2:  94%    Hemodynamics:  The patient is hemodynamically stable. Mental Status:  The patient is Alert but oriented x2  Additional MDM:   Clinical Course as of Nov 23 2327  Sat Nov 22, 2017  1517 Urine not obtained.  In/out cath ordered. Other labs reviewed.  Leukocytosis to 15.2 (up from recent admission).  CK elevated at 784, this is especially concerning since she was found down and is consistent with rhabdo.  Troponin not elevated.  Her ECG revealed a sinus rhythm with no signs of acute ischemia or dysryhtmia.  No Brugada, prolonged QT, LVH, RV strain.  CXR and CT head reassuring with no acute abnormalities.   [NA]  2305 The patient is now febrile to 101.4.  Given her leukocytosis and altered mental status, code sepsis was initiated.  Her most likely source is her urine.  Her UA is concerning for UTI. Blood cultures and lactic acid pending. Rocephin was ordered.  She has received 30 cc/kg fluid.  She was admitted to the hospitalist service. I suspect that this is the cause of her confusion and metabolic encephalopathy.  She was admitted to the floor in [facility] for sepsis and metabolic encephalopathy.  [NA]    Clinical Course User Index [NA] Alford Highland, MD     Alford Highland, MD 11/22/17 2351    Margette Fast, MD 11/23/17 1034

## 2017-11-22 NOTE — ED Triage Notes (Signed)
Pt from home by [facility] EMS pt was found on the ground with vomit around her. Unknown time down pt was confused at home but with EMS is oriented x4

## 2017-11-22 NOTE — ED Provider Notes (Signed)
[facility] EMERGENCY DEPARTMENT Provider Note   CSN: [id number] Arrival date & time: 11/22/17  1351     History   Chief Complaint Chief Complaint  Patient presents with  . Fall    HPI [name] is a 77 y.o. female who presents with a fall and vomiting. PMH significant for CAD, schizophrenia, HTN, DM, hx of breast cancer. She lives alone and has family members who live nearby. She typically ambulates with a walker or cane. She had a recent hospitalization from 7/27-8/2 for AMS. It was thought her AMS was multifactorial due to dehydration, SE of baclofen vs hypertensive encephalopathy. Infection and stroke were ruled out. Her daughters are at bedside and state that she went home because she was doing somewhat better in the hospital but was very weak and still wasn't eating. They thought they could manage her at home with a home health aide. When her daughter went to visit her this morning she found her on the floor near her bed covered in vomit. The patient is a very poor historian and states that she was going back to her bed after finishing going to the bathroom but couldn't get back in and she fell on the ground. She denies feeling dizzy or lightheaded. She denies injury from the fall. She reports ongoing diarrhea but the nausea/vomiting is new. She denies chest pain, SOB, abdominal pain. She endorses ongoing back pain which is chronic. Daughters state the patient has had possible psychotic behavior and has been accusing them of hurting her and various other paranoid behaviors.  HPI  Past Medical History:  Diagnosis Date  . Allergic rhinitis   . Anemia, iron deficiency   . Anxiety   . Arthritis    back  . CAD (coronary artery disease)    CAD stent placed 2005  . Cancer ([place])    Left breast invasive ductal carcinoma  . Dementia   . Esophageal motility disorder   . GERD (gastroesophageal reflux disease)   . HTN (hypertension)   . Hyperlipidemia   . IBS  (irritable bowel syndrome)   . Peripheral neuropathy   . Pneumonia 12-09-2014   adm with CAP requiring mechanical ventilation x 10d  . PVD (peripheral vascular disease) (HCC)    Left subclavian 70% stenosis  . Renal insufficiency   . Schizophrenia ([place])    paranoid schizophrenic  . Syncope    EP study 1996 w/atrial tachycardia  . Type II or unspecified type diabetes mellitus without mention of complication, not stated as uncontrolled     Patient Active Problem List   Diagnosis Date Noted  . Altered mental status   . Septic shock ([place]) 11/02/2015  . Acute blood loss anemia 11/02/2015  . Acute kidney injury ([place]) 11/02/2015  . Upper GI bleed 11/01/2015  . GI bleed 11/01/2015  . Breast cancer of upper-outer quadrant of left female breast ([place]) 08/29/2015  . Hypernatremia 12/23/2014  . Hypokalemia 12/23/2014  . Sepsis ([place]) 12/23/2014  . Anemia 12/23/2014  . Acute respiratory failure with hypoxemia ([place])   . Acute respiratory failure with hypoxia ([place]) 12/10/2014  . ARDS (adult respiratory distress syndrome) ([place]) 12/10/2014  . Extrapyramidal disorder   . Blood poisoning   . CAP (community acquired pneumonia) 12/09/2014  . DYSPHAGIA, PHARYNGOESOPHAGEAL PHASE 02/07/2009  . RENAL INSUFFICIENCY 02/05/2009  . TOBACCO USE, QUIT 02/03/2009  . VERTIGO 06/22/2008  . BACK PAIN 10/26/2007  . HYPERLIPIDEMIA 06/02/2007  . Dementia 06/02/2007  . Paranoid schizophrenia ([place]) 06/02/2007  .  PERIPHERAL VASCULAR DISEASE 06/02/2007  . ALLERGIC RHINITIS 06/02/2007  . IBS 06/02/2007  . FATIGUE 06/02/2007  . PERIPHERAL EDEMA 06/02/2007  . Type II diabetes mellitus, well controlled ([place]) 09/04/2006  . ANEMIA-IRON DEFICIENCY 09/04/2006  . ANXIETY 09/04/2006  . PERIPHERAL NEUROPATHY 09/04/2006  . Essential hypertension 09/04/2006  . CORONARY ARTERY DISEASE 09/04/2006  . GERD 09/04/2006  . HX, PERSONAL, SCHIZOPHRENIA 09/04/2006    Past Surgical History:  Procedure Laterality Date  .  ABDOMINAL HYSTERECTOMY    . BREAST BIOPSY     Negative  . ESOPHAGOGASTRODUODENOSCOPY N/A 11/02/2015   Procedure: ESOPHAGOGASTRODUODENOSCOPY (EGD);  Surgeon: Laurence Spates, MD;  Location: [facility];  Service: Endoscopy;  Laterality: N/A;  . JOINT REPLACEMENT Bilateral   . KNEE ARTHROPLASTY     Right   . PTCA  1995  . RADIOACTIVE SEED GUIDED PARTIAL MASTECTOMY WITH AXILLARY SENTINEL LYMPH NODE BIOPSY Left 08/17/2015   Procedure: RADIOACTIVE SEED GUIDED LUMPECTOMY WITH AXILLARY SENTINEL LYMPH NODE BIOPSY;  Surgeon: Donnie Mesa, MD;  Location: [facility];  Service: General;  Laterality: Left;  . RE-EXCISION OF BREAST LUMPECTOMY Left 09/07/2015   Procedure: RE-EXCISION OF LEFT BREAST LUMPECTOMY;  Surgeon: Donnie Mesa, MD;  Location: [facility];  Service: General;  Laterality: Left;     OB History   None      Home Medications    Prior to Admission medications   Medication Sig Start Date End Date Taking? Authorizing Provider  ACETAMINOPHEN-PAMABROM PO Take 1 tablet by mouth 2 (two) times daily. "Back Aid"    [provider]  amLODipine (NORVASC) 10 MG tablet Take 10 mg by mouth daily.     [provider]  atorvastatin (LIPITOR) 40 MG tablet Take 40 mg by mouth daily.    [provider]  carvedilol (COREG) 12.5 MG tablet Take 1 tablet (12.5 mg total) by mouth 2 (two) times daily with a meal. 11/21/17   Danford, Suann Larry, MD  Chlorphen-Phenyleph-ASA (ALKA-SELTZER PLUS COLD PO) Take 1 tablet by mouth 2 (two) times daily.    [provider]  cloNIDine (CATAPRES - DOSED IN MG/24 HR) 0.2 mg/24hr patch Place 1 patch (0.2 mg total) onto the skin every Monday at 6 PM. 11/24/17   Danford, Suann Larry, MD  cloZAPine (CLOZARIL) 100 MG tablet Take 1 tablet (100 mg total) by mouth at bedtime. Patient taking differently: Take 200 mg by mouth at bedtime.  12/27/14   Maryellen Pile, MD  donepezil (ARICEPT) 10 MG tablet Take 10 mg by  mouth at bedtime.    [provider]  esomeprazole (NEXIUM) 40 MG capsule Take 40 mg by mouth 2 (two) times daily.    [provider]  feeding supplement, GLUCERNA SHAKE, (GLUCERNA SHAKE) LIQD Take 237 mLs by mouth 3 (three) times daily between meals. Patient not taking: Reported on 11/16/2017 11/09/15   Murlean Iba, MD  furosemide (LASIX) 40 MG tablet Take 40 mg by mouth daily.     [provider]  gabapentin (NEURONTIN) 100 MG capsule Take 100 mg by mouth at bedtime as needed (pain).    [provider]  haloperidol (HALDOL) 5 MG tablet Take 5-10 mg by mouth See admin instructions. Take one tablet (5 mg) by mouth every morning and two tablets (10 mg) at bedtime as tolerated    [provider]  HYDROcodone-acetaminophen (NORCO/VICODIN) 5-325 MG tablet Take 1-2 tablets by mouth every 6 (six) hours as needed for moderate pain. Patient not taking: Reported on 11/16/2017  01/22/16   Lajean Saver, MD  insulin glargine (LANTUS) 100 UNIT/ML injection Inject 15 Units into the skin at bedtime.    [provider]  memantine (NAMENDA) 10 MG tablet Take 1 tablet (10 mg total) by mouth 2 (two) times daily. 10/09/12   Norins, Heinz Knuckles, MD  mirtazapine (REMERON) 45 MG tablet Take 45 mg by mouth at bedtime.     [provider]  potassium chloride (KLOR-CON) 8 MEQ tablet Take 1 tablet (8 mEq total) by mouth daily. Patient taking differently: Take 16 mEq by mouth daily.  04/28/12   Norins, Heinz Knuckles, MD  ranitidine (ZANTAC) 150 MG capsule Take 150 mg by mouth 2 (two) times daily.    [provider]  valsartan (DIOVAN) 160 MG tablet Take 160 mg by mouth daily.    [provider]    Family History Family History  Problem Relation Age of Onset  . Hypertension Other   . Diabetes Other   . Schizophrenia Neg Hx   . Coronary artery disease Neg Hx   . Colon cancer Neg Hx   . Breast cancer Neg Hx     Social History Social History     Tobacco Use  . Smoking status: Former Research scientist (life sciences)  . Smokeless tobacco: Never Used  Substance Use Topics  . Alcohol use: No  . Drug use: No     Allergies   Codeine phosphate; Dexilant [dexlansoprazole]; Glimepiride; Glyburide; Levofloxacin; Shellfish allergy; and Tramadol   Review of Systems Review of Systems  Constitutional: Negative for chills and fever.  Respiratory: Negative for shortness of breath.   Cardiovascular: Negative for chest pain.  Gastrointestinal: Positive for diarrhea, nausea and vomiting. Negative for abdominal pain.  Genitourinary: Negative for dysuria.  Neurological: Negative for dizziness, syncope and headaches.  All other systems reviewed and are negative.    Physical Exam Updated Vital Signs BP (!) 172/72   Pulse 85   Temp 98.3 F (36.8 C) (Oral)   Resp (!) 27   Ht 5' (1.524 m)   Wt 64 kg (141 lb)   SpO2 97%   BMI 27.54 kg/m   Physical Exam  Constitutional: She is oriented to person, place, and time. She appears well-developed and well-nourished. No distress.  Chronically ill appearing. Very slow to respond but not altered  HENT:  Head: Normocephalic and atraumatic.  Eyes: Pupils are equal, round, and reactive to light. Conjunctivae are normal. Right eye exhibits no discharge. Left eye exhibits no discharge. No scleral icterus.  Neck: Normal range of motion.  Cardiovascular: Normal rate and regular rhythm.  Pulmonary/Chest: Effort normal and breath sounds normal. No respiratory distress.  Abdominal: Soft. Bowel sounds are normal. She exhibits no distension. There is no tenderness.  Neurological: She is alert and oriented to person, place, and time.  Skin: Skin is warm and dry.  Psychiatric: She has a normal mood and affect. Her behavior is normal.  Nursing note and vitals reviewed.    ED Treatments / Results  Labs (all labs ordered are listed, but only abnormal results are displayed) Labs Reviewed  COMPREHENSIVE METABOLIC PANEL -  Abnormal; Notable for the following components:      Result Value   Glucose, Bld 186 (*)    Albumin 3.0 (*)    GFR calc non Af Amer 57 (*)    All other components within normal limits  CBC WITH DIFFERENTIAL/PLATELET - Abnormal; Notable for the following components:   WBC 15.2 (*)    RBC 3.55 (*)  Hemoglobin 10.4 (*)    HCT 34.1 (*)    Neutro Abs 11.7 (*)    Monocytes Absolute 1.2 (*)    All other components within normal limits  URINALYSIS, ROUTINE W REFLEX MICROSCOPIC - Abnormal; Notable for the following components:   APPearance HAZY (*)    Hgb urine dipstick SMALL (*)    Bacteria, UA MANY (*)    All other components within normal limits  CK - Abnormal; Notable for the following components:   Total CK 784 (*)    All other components within normal limits  BASIC METABOLIC PANEL - Abnormal; Notable for the following components:   CO2 21 (*)    Glucose, Bld 162 (*)    All other components within normal limits  CBC WITH DIFFERENTIAL/PLATELET - Abnormal; Notable for the following components:   WBC 12.3 (*)    RBC 3.59 (*)    Hemoglobin 10.7 (*)    HCT 34.5 (*)    Neutro Abs 8.4 (*)    All other components within normal limits  GLUCOSE, CAPILLARY - Abnormal; Notable for the following components:   Glucose-Capillary 123 (*)    All other components within normal limits  CBG MONITORING, ED - Abnormal; Notable for the following components:   Glucose-Capillary 168 (*)    All other components within normal limits  CULTURE, BLOOD (ROUTINE X 2)  CULTURE, BLOOD (ROUTINE X 2)  URINE CULTURE  ETHANOL  RAPID URINE DRUG SCREEN, HOSP PERFORMED  TROPONIN I  LACTIC ACID, PLASMA  I-STAT CG4 LACTIC ACID, ED    EKG None  Radiology Dg Chest 2 View  Result Date: 11/22/2017 CLINICAL DATA:  Chest pain EXAM: CHEST - 2 VIEW COMPARISON:  11/18/2017 FINDINGS: Lungs are essentially clear. No frank interstitial edema. No pleural effusion or pneumothorax. The heart is top-normal in size. Mild  eventration of the right hemidiaphragm. Lateral view is nondiagnostic due to the patient's arms. IMPRESSION: No evidence of acute cardiopulmonary disease. Electronically Signed   By: Julian Hy M.D.   On: 11/22/2017 18:23   Ct Head Wo Contrast  Result Date: 11/22/2017 CLINICAL DATA:  Found down at home.  Vomiting.  Confusion. EXAM: CT HEAD WITHOUT CONTRAST TECHNIQUE: Contiguous axial images were obtained from the base of the skull through the vertex without intravenous contrast. COMPARISON:  CT head 11/15/2017.  MRI brain 11/17/2017. FINDINGS: Brain: There is no evidence of acute intracranial hemorrhage, mass lesion, brain edema or extra-axial fluid collection. There is stable mild generalized atrophy with mild chronic periventricular white matter disease. No hydrocephalus. There is no CT evidence of acute cortical infarction. Vascular: Intracranial vascular calcifications. No hyperdense vessel identified. Skull: Negative for fracture or focal lesion. Sinuses/Orbits: There is mild mucosal thickening throughout the left paranasal sinuses. The mastoid air cells and middle ears are clear. Probable cerumen in the left external auditory canal. No orbital abnormalities are seen. Other: None. IMPRESSION: No acute intracranial findings. Mild mucosal thickening throughout the left paranasal sinuses. Electronically Signed   By: Richardean Sale M.D.   On: 11/22/2017 16:30    Procedures Procedures (including critical care time)  Medications Ordered in ED Medications  hydrALAZINE (APRESOLINE) injection 10 mg (has no administration in time range)  amLODipine (NORVASC) tablet 10 mg (has no administration in time range)  atorvastatin (LIPITOR) tablet 40 mg (has no administration in time range)  carvedilol (COREG) tablet 12.5 mg (has no administration in time range)  cloNIDine (CATAPRES - Dosed in mg/24 hr) patch 0.2 mg (has no administration  in time range)  cloZAPine (CLOZARIL) tablet 200 mg (200 mg Oral Not  Given 11/23/17 0115)  donepezil (ARICEPT) tablet 10 mg (has no administration in time range)  pantoprazole (PROTONIX) EC tablet 40 mg (has no administration in time range)  furosemide (LASIX) tablet 40 mg (has no administration in time range)  gabapentin (NEURONTIN) capsule 100 mg (has no administration in time range)  HYDROcodone-acetaminophen (NORCO/VICODIN) 5-325 MG per tablet 1-2 tablet (has no administration in time range)  memantine (NAMENDA) tablet 10 mg (has no administration in time range)  mirtazapine (REMERON) tablet 45 mg (has no administration in time range)  potassium chloride (K-DUR,KLOR-CON) CR tablet 20 mEq (has no administration in time range)  famotidine (PEPCID) tablet 20 mg (has no administration in time range)  irbesartan (AVAPRO) tablet 150 mg (has no administration in time range)  enoxaparin (LOVENOX) injection 40 mg (has no administration in time range)  sodium chloride flush (NS) 0.9 % injection 3 mL (has no administration in time range)  sodium chloride flush (NS) 0.9 % injection 3 mL (has no administration in time range)  0.9 %  sodium chloride infusion (has no administration in time range)  acetaminophen (TYLENOL) tablet 650 mg (has no administration in time range)    Or  acetaminophen (TYLENOL) suppository 650 mg (has no administration in time range)  senna-docusate (Senokot-S) tablet 1 tablet (has no administration in time range)  ondansetron (ZOFRAN) tablet 4 mg (has no administration in time range)    Or  ondansetron (ZOFRAN) injection 4 mg (has no administration in time range)  insulin glargine (LANTUS) injection 8 Units (8 Units Subcutaneous Given 11/23/17 0246)  insulin aspart (novoLOG) injection 0-9 Units (has no administration in time range)  insulin aspart (novoLOG) injection 0-5 Units (0 Units Subcutaneous Not Given 11/23/17 0222)  ceFEPIme (MAXIPIME) 1 g in sodium chloride 0.9 % 100 mL IVPB (has no administration in time range)  haloperidol (HALDOL) tablet  5 mg (has no administration in time range)    And  haloperidol (HALDOL) tablet 10 mg (has no administration in time range)  lactated ringers bolus 1,000 mL (0 mLs Intravenous Stopped 11/22/17 1958)  cefTRIAXone (ROCEPHIN) 2 g in sodium chloride 0.9 % 100 mL IVPB (0 g Intravenous Stopped 11/22/17 2324)  acetaminophen (TYLENOL) solution 650 mg (650 mg Oral Given 11/23/17 0024)     Initial Impression / Assessment and Plan / ED Course  I have reviewed the triage vital signs and the nursing notes.  Pertinent labs & imaging results that were available during my care of the patient were reviewed by me and considered in my medical decision making (see chart for details).  77 year old female presents with a fall and altered mental status.  Daughters are at bedside states this is the exact presentation he had several days ago when she was admitted to the hospital.  She is hypertensive but otherwise vital signs are normal.  On exam the patient is alert and answers to questions however is somewhat altered.  She only complains of back pain which is chronic for her.  Rectal temp, labs, head CT ordered since patient had an unwitnessed fall and is an unreliable historian.  Care transferred to Dr. Drue Flirt who will dispose accordingly.  Final Clinical Impressions(s) / ED Diagnoses   Final diagnoses:  Acute cystitis without hematuria  Sepsis, due to unspecified organism [facility])  Metabolic encephalopathy    ED Discharge Orders    None       Recardo Evangelist, PA-C 11/23/17  [id number]    Pattricia Boss, MD 11/23/17 1536

## 2017-11-23 DIAGNOSIS — I10 Essential (primary) hypertension: Secondary | ICD-10-CM | POA: Diagnosis not present

## 2017-11-23 DIAGNOSIS — I5032 Chronic diastolic (congestive) heart failure: Secondary | ICD-10-CM | POA: Diagnosis not present

## 2017-11-23 DIAGNOSIS — M545 Low back pain: Secondary | ICD-10-CM | POA: Diagnosis not present

## 2017-11-23 DIAGNOSIS — E119 Type 2 diabetes mellitus without complications: Secondary | ICD-10-CM | POA: Diagnosis not present

## 2017-11-23 DIAGNOSIS — R6510 Systemic inflammatory response syndrome (SIRS) of non-infectious origin without acute organ dysfunction: Secondary | ICD-10-CM | POA: Diagnosis not present

## 2017-11-23 DIAGNOSIS — F200 Paranoid schizophrenia: Secondary | ICD-10-CM

## 2017-11-23 DIAGNOSIS — D509 Iron deficiency anemia, unspecified: Secondary | ICD-10-CM

## 2017-11-23 DIAGNOSIS — F0390 Unspecified dementia without behavioral disturbance: Secondary | ICD-10-CM

## 2017-11-23 DIAGNOSIS — G8929 Other chronic pain: Secondary | ICD-10-CM | POA: Diagnosis not present

## 2017-11-23 LAB — BASIC METABOLIC PANEL
ANION GAP: 12 (ref 5–15)
BUN: 16 mg/dL (ref 8–23)
CALCIUM: 9.4 mg/dL (ref 8.9–10.3)
CO2: 21 mmol/L — AB (ref 22–32)
Chloride: 108 mmol/L (ref 98–111)
Creatinine, Ser: 0.85 mg/dL (ref 0.44–1.00)
GFR calc Af Amer: >60 mL/min (ref >60)
GFR calc non Af Amer: >60 mL/min (ref >60)
GLUCOSE: 162 mg/dL — AB (ref 70–99)
POTASSIUM: 3.7 mmol/L (ref 3.5–5.1)
Sodium: 141 mmol/L (ref 135–145)

## 2017-11-23 LAB — CBC WITH DIFFERENTIAL/PLATELET
ABS IMMATURE GRANULOCYTES: 0 10*3/uL (ref 0.0–0.1)
Basophils Absolute: 0.1 10*3/uL (ref 0.0–0.1)
Basophils Relative: 0 %
Eosinophils Absolute: 0.1 10*3/uL (ref 0.0–0.7)
Eosinophils Relative: 1 %
HCT: 34.5 % — ABNORMAL LOW (ref 36.0–46.0)
Hemoglobin: 10.7 g/dL — ABNORMAL LOW (ref 12.0–15.0)
IMMATURE GRANULOCYTES: 0 %
LYMPHS ABS: 2.7 10*3/uL (ref 0.7–4.0)
Lymphocytes Relative: 22 %
MCH: 29.8 pg (ref 26.0–34.0)
MCHC: 31 g/dL (ref 30.0–36.0)
MCV: 96.1 fL (ref 78.0–100.0)
MONO ABS: 1 10*3/uL (ref 0.1–1.0)
MONOS PCT: 8 %
NEUTROS ABS: 8.4 10*3/uL — AB (ref 1.7–7.7)
Neutrophils Relative %: 69 %
PLATELETS: 240 10*3/uL (ref 150–400)
RBC: 3.59 MIL/uL — ABNORMAL LOW (ref 3.87–5.11)
RDW: 13.6 % (ref 11.5–15.5)
WBC: 12.3 10*3/uL — ABNORMAL HIGH (ref 4.0–10.5)

## 2017-11-23 LAB — GLUCOSE, CAPILLARY
GLUCOSE-CAPILLARY: 126 mg/dL — AB (ref 70–99)
GLUCOSE-CAPILLARY: 231 mg/dL — AB (ref 70–99)
GLUCOSE-CAPILLARY: 112 mg/dL — AB (ref 70–99)
Glucose-Capillary: 123 mg/dL — ABNORMAL HIGH (ref 70–99)

## 2017-11-23 LAB — I-STAT CG4 LACTIC ACID, ED: Lactic Acid, Venous: 0.79 mmol/L (ref 0.5–1.9)

## 2017-11-23 LAB — CBG MONITORING, ED: GLUCOSE-CAPILLARY: 168 mg/dL — AB (ref 70–99)

## 2017-11-23 LAB — LACTIC ACID, PLASMA: Lactic Acid, Venous: 1 mmol/L (ref 0.5–1.9)

## 2017-11-23 MED ORDER — MEMANTINE HCL 10 MG PO TABS
10.0000 mg | ORAL_TABLET | Freq: Two times a day (BID) | ORAL | Status: DC
  Administered 2017-11-23 – 2017-11-27 (×9): 10 mg via ORAL
  Filled 2017-11-23 (×10): qty 1

## 2017-11-23 MED ORDER — MIRTAZAPINE 45 MG PO TABS
45.0000 mg | ORAL_TABLET | Freq: Every day | ORAL | Status: DC
  Administered 2017-11-23 – 2017-11-26 (×4): 45 mg via ORAL
  Filled 2017-11-23 (×2): qty 1
  Filled 2017-11-23: qty 3
  Filled 2017-11-23: qty 1
  Filled 2017-11-23: qty 3
  Filled 2017-11-23 (×2): qty 1
  Filled 2017-11-23 (×2): qty 3

## 2017-11-23 MED ORDER — INSULIN GLARGINE 100 UNIT/ML ~~LOC~~ SOLN
8.0000 [IU] | Freq: Every day | SUBCUTANEOUS | Status: DC
  Administered 2017-11-23 – 2017-11-26 (×5): 8 [IU] via SUBCUTANEOUS
  Filled 2017-11-23 (×6): qty 0.08

## 2017-11-23 MED ORDER — SENNOSIDES-DOCUSATE SODIUM 8.6-50 MG PO TABS: 1.0000 | ORAL_TABLET | Freq: Every evening | ORAL | Status: DC | PRN

## 2017-11-23 MED ORDER — FUROSEMIDE 20 MG PO TABS
40.0000 mg | ORAL_TABLET | Freq: Every day | ORAL | Status: DC
  Administered 2017-11-23: 40 mg via ORAL
  Filled 2017-11-23: qty 2

## 2017-11-23 MED ORDER — HYDROCODONE-ACETAMINOPHEN 5-325 MG PO TABS: 1.0000 | ORAL_TABLET | Freq: Four times a day (QID) | ORAL | Status: DC | PRN

## 2017-11-23 MED ORDER — GABAPENTIN 100 MG PO CAPS
100.0000 mg | ORAL_CAPSULE | Freq: Every evening | ORAL | Status: DC | PRN
  Administered 2017-11-23: 100 mg via ORAL
  Filled 2017-11-23: qty 1

## 2017-11-23 MED ORDER — CLONIDINE HCL 0.2 MG/24HR TD PTWK
0.2000 mg | MEDICATED_PATCH | TRANSDERMAL | Status: DC
  Administered 2017-11-24: 0.2 mg via TRANSDERMAL
  Filled 2017-11-23: qty 1

## 2017-11-23 MED ORDER — ENOXAPARIN SODIUM 40 MG/0.4ML ~~LOC~~ SOLN
40.0000 mg | Freq: Every day | SUBCUTANEOUS | Status: DC
  Administered 2017-11-23 – 2017-11-27 (×5): 40 mg via SUBCUTANEOUS
  Filled 2017-11-23 (×5): qty 0.4

## 2017-11-23 MED ORDER — PANTOPRAZOLE SODIUM 40 MG PO TBEC
40.0000 mg | DELAYED_RELEASE_TABLET | Freq: Two times a day (BID) | ORAL | Status: DC
  Administered 2017-11-23 – 2017-11-27 (×10): 40 mg via ORAL
  Filled 2017-11-23 (×10): qty 1

## 2017-11-23 MED ORDER — INSULIN ASPART 100 UNIT/ML ~~LOC~~ SOLN
0.0000 [IU] | Freq: Three times a day (TID) | SUBCUTANEOUS | Status: DC
  Administered 2017-11-23: 3 [IU] via SUBCUTANEOUS
  Administered 2017-11-23: 1 [IU] via SUBCUTANEOUS
  Administered 2017-11-24: 5 [IU] via SUBCUTANEOUS
  Administered 2017-11-25 (×3): 1 [IU] via SUBCUTANEOUS
  Administered 2017-11-26: 2 [IU] via SUBCUTANEOUS
  Administered 2017-11-26 – 2017-11-27 (×2): 1 [IU] via SUBCUTANEOUS
  Administered 2017-11-27: 3 [IU] via SUBCUTANEOUS
  Administered 2017-11-27: 1 [IU] via SUBCUTANEOUS

## 2017-11-23 MED ORDER — SODIUM CHLORIDE 0.9 % IV SOLN
INTRAVENOUS | Status: AC
  Administered 2017-11-23 – 2017-11-24 (×2): via INTRAVENOUS

## 2017-11-23 MED ORDER — INSULIN ASPART 100 UNIT/ML ~~LOC~~ SOLN
0.0000 [IU] | Freq: Every day | SUBCUTANEOUS | Status: DC
Start: 2017-11-23 — End: 2017-11-28
  Administered 2017-11-24: 2 [IU] via SUBCUTANEOUS

## 2017-11-23 MED ORDER — SODIUM CHLORIDE 0.9 % IV SOLN
2.0000 g | INTRAVENOUS | Status: DC
  Administered 2017-11-23 – 2017-11-27 (×4): 2 g via INTRAVENOUS
  Filled 2017-11-23 (×5): qty 20

## 2017-11-23 MED ORDER — CLOZAPINE 100 MG PO TABS
200.0000 mg | ORAL_TABLET | Freq: Every day | ORAL | Status: DC
  Administered 2017-11-23 – 2017-11-26 (×4): 200 mg via ORAL
  Filled 2017-11-23 (×6): qty 2

## 2017-11-23 MED ORDER — ONDANSETRON HCL 4 MG PO TABS: 4.0000 mg | ORAL_TABLET | Freq: Four times a day (QID) | ORAL | Status: DC | PRN

## 2017-11-23 MED ORDER — ONDANSETRON HCL 4 MG/2ML IJ SOLN: 4.0000 mg | Freq: Four times a day (QID) | INTRAMUSCULAR | Status: DC | PRN

## 2017-11-23 MED ORDER — HALOPERIDOL 5 MG PO TABS
5.0000 mg | ORAL_TABLET | Freq: Every day | ORAL | Status: DC
  Administered 2017-11-23 – 2017-11-27 (×5): 5 mg via ORAL
  Filled 2017-11-23 (×5): qty 1

## 2017-11-23 MED ORDER — HALOPERIDOL 5 MG PO TABS
10.0000 mg | ORAL_TABLET | Freq: Every day | ORAL | Status: DC
  Administered 2017-11-23 – 2017-11-26 (×4): 10 mg via ORAL
  Filled 2017-11-23 (×5): qty 2

## 2017-11-23 MED ORDER — SODIUM CHLORIDE 0.9% FLUSH
3.0000 mL | Freq: Two times a day (BID) | INTRAVENOUS | Status: DC
  Administered 2017-11-23 – 2017-11-27 (×9): 3 mL via INTRAVENOUS

## 2017-11-23 MED ORDER — SODIUM CHLORIDE 0.9% FLUSH: 3.0000 mL | INTRAVENOUS | Status: DC | PRN

## 2017-11-23 MED ORDER — POTASSIUM CHLORIDE CRYS ER 10 MEQ PO TBCR
20.0000 meq | EXTENDED_RELEASE_TABLET | Freq: Every day | ORAL | Status: DC
  Administered 2017-11-23 – 2017-11-24 (×2): 20 meq via ORAL
  Filled 2017-11-23 (×2): qty 2

## 2017-11-23 MED ORDER — HALOPERIDOL 5 MG PO TABS: 5.0000 mg | ORAL_TABLET | ORAL | Status: DC

## 2017-11-23 MED ORDER — SODIUM CHLORIDE 0.9 % IV SOLN
1.0000 g | Freq: Two times a day (BID) | INTRAVENOUS | Status: DC
  Administered 2017-11-23: 1 g via INTRAVENOUS
  Filled 2017-11-23 (×2): qty 1

## 2017-11-23 MED ORDER — FAMOTIDINE 20 MG PO TABS
20.0000 mg | ORAL_TABLET | Freq: Two times a day (BID) | ORAL | Status: DC
  Administered 2017-11-23 – 2017-11-27 (×9): 20 mg via ORAL
  Filled 2017-11-23 (×9): qty 1

## 2017-11-23 MED ORDER — CARVEDILOL 12.5 MG PO TABS
12.5000 mg | ORAL_TABLET | Freq: Two times a day (BID) | ORAL | Status: DC
  Administered 2017-11-23 – 2017-11-25 (×5): 12.5 mg via ORAL
  Filled 2017-11-23 (×5): qty 1

## 2017-11-23 MED ORDER — ACETAMINOPHEN 650 MG RE SUPP
650.0000 mg | Freq: Four times a day (QID) | RECTAL | Status: DC | PRN
Start: 2017-11-23 — End: 2017-11-28

## 2017-11-23 MED ORDER — ATORVASTATIN CALCIUM 20 MG PO TABS
40.0000 mg | ORAL_TABLET | Freq: Every day | ORAL | Status: DC
  Administered 2017-11-23 – 2017-11-27 (×5): 40 mg via ORAL
  Filled 2017-11-23 (×6): qty 2

## 2017-11-23 MED ORDER — SODIUM CHLORIDE 0.9 % IV SOLN
250.0000 mL | INTRAVENOUS | Status: DC | PRN
  Administered 2017-11-23: 250 mL via INTRAVENOUS

## 2017-11-23 MED ORDER — ACETAMINOPHEN 325 MG PO TABS
650.0000 mg | ORAL_TABLET | Freq: Four times a day (QID) | ORAL | Status: DC | PRN
  Administered 2017-11-24 – 2017-11-26 (×4): 650 mg via ORAL
  Filled 2017-11-23 (×4): qty 2

## 2017-11-23 MED ORDER — AMLODIPINE BESYLATE 5 MG PO TABS
10.0000 mg | ORAL_TABLET | Freq: Every day | ORAL | Status: DC
  Administered 2017-11-23 – 2017-11-27 (×5): 10 mg via ORAL
  Filled 2017-11-23 (×5): qty 2

## 2017-11-23 MED ORDER — ACETAMINOPHEN 160 MG/5ML PO SOLN
650.0000 mg | Freq: Once | ORAL | Status: AC
  Administered 2017-11-23: 650 mg via ORAL
  Filled 2017-11-23: qty 20.3

## 2017-11-23 MED ORDER — IRBESARTAN 150 MG PO TABS
150.0000 mg | ORAL_TABLET | Freq: Every day | ORAL | Status: DC
  Administered 2017-11-23 – 2017-11-27 (×5): 150 mg via ORAL
  Filled 2017-11-23 (×6): qty 1

## 2017-11-23 MED ORDER — DONEPEZIL HCL 10 MG PO TABS
10.0000 mg | ORAL_TABLET | Freq: Every day | ORAL | Status: DC
  Administered 2017-11-23 – 2017-11-26 (×4): 10 mg via ORAL
  Filled 2017-11-23 (×4): qty 1

## 2017-11-23 NOTE — Progress Notes (Signed)
PROGRESS NOTE    [name]  QBH:[id number] DOB: 1940/10/15 DOA: 11/22/2017 PCP: [name], MD      Brief Narrative:  [name] is a62 y.o.F with mild dementia, community dwelling, paranoid schizophrenia, HTN, and DM who was admitted earlier this week with metabolic encephalopathy, thought to be medication induced or hypertensive encephalopathy, discharged to home --> found in the morning at home on the floor, covered in vomit and stool, and brought back to the hospital.  There found to have fever 101.26F, worsening leukocytosis, still clear CXR but new bacteriuria.  Started on antibiotics and admitted for UTI, rhabdomyolysis and ambulatory dysfunction.     Assessment & Plan:  Pyelonephritis Presents with fever, leukocytosis, vomiting and inability to stand.  Urine with bacteruria.  CXR clear.   -IV fluids -Change back to ceftriaxone -Follow WBC -Obtain Urine culture which was unfortunately not obtained in the ER   Acute metabolic encephalopathy At baseline, she is independent with ADLs, able to fix meals, able to hold conversations.  At present she is sluggish and confused and unable to answer questions properly.  Hypertensive urgency Chronic diastolic CHF Appears euvolemic.  BP elevated, family had misunderstood and were not taking her amlodipine and furosemide -Continue amlodipine, carvedilol, furosemide, clonidine, ARB -Continue furosemide home dose -Strict I'Os, daily weights -Continue statin  Schizophrenia Dementia -Continue Clozaril, Haldol -Continue donepezil, mematine, mirtazapine  Diabetes -Continue glargine -Continue SSI  Chronic pain -Continue home pain Norco PRN -Continue gabapentin  Anemia, acute normocytic Likely dilutional     DVT prophylaxis: Lovenox Code Status: FULL Family Communication: Daughter at bedside and other by phone MDM and disposition Plan: The below labs and imaging reports were reviewed and summarized above.     The patient was admitted with UTI/SIRS, vomiting, inability to stand and altered mentation/confusion.  Will treat with IV antibiotics and wait for culture.  If able to stand, likely to SNF within 2 days.      Consultants:   None  Procedures:   None  Antimicrobials:   Ceftriaxone 8/3 >>    Subjective:   Objective: Vitals:   11/23/17 0130 11/23/17 0200 11/23/17 0230 11/23/17 0337  BP: (!) 160/63 (!) 147/59 (!) 143/56 (!) 137/59  Pulse: 79 75 77 80  Resp: (!) 21 (!) 21  (!) 22  Temp:    98.4 F (36.9 C)  TempSrc:    Axillary  SpO2: 92% 90% 90% 95%  Weight:    66.8 kg (147 lb 4.8 oz)  Height:    5' (1.524 m)    Intake/Output Summary (Last 24 hours) at 11/23/2017 1518 Last data filed at 11/23/2017 0950 Gross per 24 hour  Intake 340 ml  Output 0 ml  Net 340 ml   Filed Weights   11/22/17 1358 11/23/17 0337  Weight: 64 kg (141 lb) 66.8 kg (147 lb 4.8 oz)    Examination: The patient was seen and examined. For further details please see Hand P by Dr. Myna Hidalgo from [facility] today.   Data Reviewed: I have personally reviewed following labs and imaging studies:  CBC: Recent Labs  Lab 11/18/17 0525 11/19/17 0615 11/21/17 0314 11/22/17 1716 11/23/17 0304  WBC 12.2* 12.8* 12.4* 15.2* 12.3*  NEUTROABS  --   --   --  11.7* 8.4*  HGB 11.0* 12.0 11.8* 10.4* 10.7*  HCT 35.1* 38.8 39.4 34.1* 34.5*  MCV 94.9 95.8 99.7 96.1 96.1  PLT 269 230 208 248 097   Basic Metabolic Panel: Recent Labs  Lab 11/19/17 0615  11/20/17 0401 11/21/17 0314 11/22/17 1716 11/23/17 0304  NA 144 143 142 142 141  K 4.5 4.9 4.7 3.7 3.7  CL 113* 111 110 108 108  CO2 22 23 22 24  21*  GLUCOSE 152* 189* 174* 186* 162*  BUN 25* 28* 24* 22 16  CREATININE 0.95 1.19* 0.97 0.94 0.85  CALCIUM 9.4 9.6 9.4 9.6 9.4   GFR: Estimated Creatinine Clearance: 48 mL/min (by C-G formula based on SCr of 0.85 mg/dL). Liver Function Tests: Recent Labs  Lab 11/18/17 0525 11/22/17 1716  AST 22 34  ALT 21  27  ALKPHOS 78 73  BILITOT 0.7 0.6  PROT 6.1* 6.7  ALBUMIN 3.0* 3.0*   No results for input(s): LIPASE, AMYLASE in the last 168 hours. No results for input(s): AMMONIA in the last 168 hours. Coagulation Profile: No results for input(s): INR, PROTIME in the last 168 hours. Cardiac Enzymes: Recent Labs  Lab 11/22/17 1717  CKTOTAL 784*  TROPONINI <0.03   BNP (last 3 results) No results for input(s): PROBNP in the last 8760 hours. HbA1C: No results for input(s): HGBA1C in the last 72 hours. CBG: Recent Labs  Lab 11/21/17 0749 11/21/17 1205 11/23/17 0207 11/23/17 0749 11/23/17 1142  GLUCAP 157* 192* 168* 123* 126*   Lipid Profile: No results for input(s): CHOL, HDL, LDLCALC, TRIG, CHOLHDL, LDLDIRECT in the last 72 hours. Thyroid Function Tests: No results for input(s): TSH, T4TOTAL, FREET4, T3FREE, THYROIDAB in the last 72 hours. Anemia Panel: No results for input(s): VITAMINB12, FOLATE, FERRITIN, TIBC, IRON, RETICCTPCT in the last 72 hours. Urine analysis:    Component Value Date/Time   COLORURINE YELLOW 11/22/2017 2137   APPEARANCEUR HAZY (A) 11/22/2017 2137   LABSPEC 1.012 11/22/2017 2137   PHURINE 6.0 11/22/2017 2137   GLUCOSEU NEGATIVE 11/22/2017 2137   GLUCOSEU NEGATIVE 06/22/2008 0957   HGBUR SMALL (A) 11/22/2017 2137   HGBUR negative 12/12/2009 1102   BILIRUBINUR NEGATIVE 11/22/2017 2137   BILIRUBINUR small 11/09/2010 [place] 11/22/2017 2137   PROTEINUR NEGATIVE 11/22/2017 2137   UROBILINOGEN 1.0 12/12/2014 1100   NITRITE NEGATIVE 11/22/2017 2137   LEUKOCYTESUR NEGATIVE 11/22/2017 2137   Sepsis Labs: @LABRCNTIP (procalcitonin:4,lacticacidven:4)  ) Recent Results (from the past 240 hour(s))  MRSA PCR Screening     Status: None   Collection Time: 11/16/17 12:29 AM  Result Value Ref Range Status   MRSA by PCR NEGATIVE NEGATIVE Final    Comment:        The GeneXpert MRSA Assay (FDA approved for NASAL specimens only), is one component  of a comprehensive MRSA colonization surveillance program. It is not intended to diagnose MRSA infection nor to guide or monitor treatment for MRSA infections. Performed at [facility], [place] [address], [place], [place] [address]   Culture, blood (Routine X 2) w Reflex to ID Panel     Status: None   Collection Time: 11/17/17  3:40 PM  Result Value Ref Range Status   Specimen Description BLOOD RIGHT HAND  Final   Special Requests   Final    BOTTLES DRAWN AEROBIC ONLY Blood Culture adequate volume   Culture   Final    NO GROWTH 5 DAYS Performed at [facility], [place] [address], [place], [place] [address]    Report Status 11/22/2017 FINAL  Final  Culture, blood (Routine X 2) w Reflex to ID Panel     Status: None   Collection Time: 11/17/17  3:47 PM  Result Value Ref Range  Status   Specimen Description BLOOD RIGHT HAND  Final   Special Requests   Final    BOTTLES DRAWN AEROBIC ONLY Blood Culture adequate volume   Culture   Final    NO GROWTH 5 DAYS Performed at [facility], [address], [place], [place] [address]    Report Status 11/22/2017 FINAL  Final         Radiology Studies: Dg Chest 2 View  Result Date: 11/22/2017 CLINICAL DATA:  Chest pain EXAM: CHEST - 2 VIEW COMPARISON:  11/18/2017 FINDINGS: Lungs are essentially clear. No frank interstitial edema. No pleural effusion or pneumothorax. The heart is top-normal in size. Mild eventration of the right hemidiaphragm. Lateral view is nondiagnostic due to the patient's arms. IMPRESSION: No evidence of acute cardiopulmonary disease. Electronically Signed   By: Julian Hy M.D.   On: 11/22/2017 18:23   Ct Head Wo Contrast  Result Date: 11/22/2017 CLINICAL DATA:  Found down at home.  Vomiting.  Confusion. EXAM: CT HEAD WITHOUT CONTRAST TECHNIQUE: Contiguous axial images were obtained from the base of the skull through the vertex without intravenous contrast. COMPARISON:  CT head 11/15/2017.  MRI brain  11/17/2017. FINDINGS: Brain: There is no evidence of acute intracranial hemorrhage, mass lesion, brain edema or extra-axial fluid collection. There is stable mild generalized atrophy with mild chronic periventricular white matter disease. No hydrocephalus. There is no CT evidence of acute cortical infarction. Vascular: Intracranial vascular calcifications. No hyperdense vessel identified. Skull: Negative for fracture or focal lesion. Sinuses/Orbits: There is mild mucosal thickening throughout the left paranasal sinuses. The mastoid air cells and middle ears are clear. Probable cerumen in the left external auditory canal. No orbital abnormalities are seen. Other: None. IMPRESSION: No acute intracranial findings. Mild mucosal thickening throughout the left paranasal sinuses. Electronically Signed   By: Richardean Sale M.D.   On: 11/22/2017 16:30        Scheduled Meds: . amLODipine  10 mg Oral Daily  . atorvastatin  40 mg Oral q1800  . carvedilol  12.5 mg Oral BID WC  . [START ON 11/24/2017] cloNIDine  0.2 mg Transdermal Q Mon-1800  . cloZAPine  200 mg Oral QHS  . donepezil  10 mg Oral QHS  . enoxaparin (LOVENOX) injection  40 mg Subcutaneous Daily  . famotidine  20 mg Oral BID  . furosemide  40 mg Oral Daily  . haloperidol  5 mg Oral Daily   And  . haloperidol  10 mg Oral QHS  . insulin aspart  0-5 Units Subcutaneous QHS  . insulin aspart  0-9 Units Subcutaneous TID WC  . insulin glargine  8 Units Subcutaneous QHS  . irbesartan  150 mg Oral Daily  . memantine  10 mg Oral BID  . mirtazapine  45 mg Oral QHS  . pantoprazole  40 mg Oral BID AC  . potassium chloride  20 mEq Oral Daily  . sodium chloride flush  3 mL Intravenous Q12H   Continuous Infusions: . sodium chloride 250 mL (11/23/17 0909)  . ceFEPime (MAXIPIME) IV 1 g (11/23/17 0911)     LOS: 0 days    Time spent: [place], MD [facility] 11/23/2017, 3:18 PM     Pager [phone] --- please page  though AMION:  [email or web address] If 7PM-7AM, please contact night-coverage

## 2017-11-23 NOTE — H&P (Signed)
History and Physical    [name] YWV:[id number] DOB: [birth year] DOA: 11/22/2017  PCP: [name], MD   Patient coming from: Home   Chief Complaint: Gen weakness, vomiting  HPI: [name] is a 77 y.o. female with medical history significant for dementia, paranoid schizophrenia, resistant hypertension, coronary artery disease, chronic diastolic CHF, and insulin-dependent diabetes mellitus, now presenting to the emergency department after being found on the floor with vomit.  Patient had been discharged from the hospital yesterday after admission for altered mental status, during which she was evaluated by psychiatry, improved, and was discharged home after family declined the recommended SNF placement.  Today, patient was found on the floor after apparently vomiting.  She seemed generally weak, but did not have any complaints.  Family brought her into the ED for evaluation.  She had not been complaining of anything leading up to this and denies any pain on arrival.  ED Course: Upon arrival to the ED, patient is found to be febrile to 38.6 C, saturating adequately on room air, and with vitals otherwise stable.  EKG features a sinus rhythm and chest x-ray is negative for acute cardiopulmonary disease.  Noncontrast head CT is negative for acute intracranial abnormality.  Chemistry panel is unremarkable and CBC is notable for an increased leukocytosis, now 15,200, and a normocytic anemia with hemoglobin 10.4.  Troponin is undetectable, BNP is 784, ethanol and UDS are negative, and urinalysis is notable for many bacteria with no WBC on micro.  Blood cultures were collected, 1 L of lactated Ringer's given, and the patient was treated with empiric Rocephin 2 g IV for suspected UTI.  She remains hemodynamically stable, in no apparent respiratory distress, and will be observed for ongoing evaluation and management of SIRS.  Review of Systems:  Unable to complete ROS secondary to patient's clinical  condition.  Past Medical History:  Diagnosis Date  . Allergic rhinitis   . Anemia, iron deficiency   . Anxiety   . Arthritis    back  . CAD (coronary artery disease)    CAD stent placed 2005  . Cancer ([place])    Left breast invasive ductal carcinoma  . Dementia   . Esophageal motility disorder   . GERD (gastroesophageal reflux disease)   . HTN (hypertension)   . Hyperlipidemia   . IBS (irritable bowel syndrome)   . Peripheral neuropathy   . Pneumonia 12-09-2014   adm with CAP requiring mechanical ventilation x 10d  . PVD (peripheral vascular disease) ([facility])    Left subclavian 70% stenosis  . Renal insufficiency   . Schizophrenia ([place])    paranoid schizophrenic  . Syncope    EP study 1996 w/atrial tachycardia  . Type II or unspecified type diabetes mellitus without mention of complication, not stated as uncontrolled     Past Surgical History:  Procedure Laterality Date  . ABDOMINAL HYSTERECTOMY    . BREAST BIOPSY     Negative  . ESOPHAGOGASTRODUODENOSCOPY N/A 11/02/2015   Procedure: ESOPHAGOGASTRODUODENOSCOPY (EGD);  Surgeon: Laurence Spates, MD;  Location: [facility];  Service: Endoscopy;  Laterality: N/A;  . JOINT REPLACEMENT Bilateral   . KNEE ARTHROPLASTY     Right   . PTCA  1995  . RADIOACTIVE SEED GUIDED PARTIAL MASTECTOMY WITH AXILLARY SENTINEL LYMPH NODE BIOPSY Left 08/17/2015   Procedure: RADIOACTIVE SEED GUIDED LUMPECTOMY WITH AXILLARY SENTINEL LYMPH NODE BIOPSY;  Surgeon: Donnie Mesa, MD;  Location: [facility];  Service: General;  Laterality: Left;  . RE-EXCISION OF BREAST  LUMPECTOMY Left 09/07/2015   Procedure: RE-EXCISION OF LEFT BREAST LUMPECTOMY;  Surgeon: Donnie Mesa, MD;  Location: [facility];  Service: General;  Laterality: Left;     reports that she has quit smoking. She has never used smokeless tobacco. She reports that she does not drink alcohol or use drugs.  Allergies  Allergen Reactions  . Codeine Phosphate Other  (See Comments)    Unknown reaction  . Dexilant [Dexlansoprazole] Other (See Comments)    weakness  . Glimepiride Other (See Comments)    Unknown reaction  . Glyburide Other (See Comments)    Unknown reaction  . Levofloxacin Nausea Only    Made her weak and nauseated  . Shellfish Allergy Swelling    Lip swelling  . Tramadol Other (See Comments)    Unknown reaction    Family History  Problem Relation Age of Onset  . Hypertension Other   . Diabetes Other   . Schizophrenia Neg Hx   . Coronary artery disease Neg Hx   . Colon cancer Neg Hx   . Breast cancer Neg Hx      Prior to Admission medications   Medication Sig Start Date End Date Taking? Authorizing Provider  ACETAMINOPHEN-PAMABROM PO Take 1 tablet by mouth 2 (two) times daily. "Back Aid"    [provider]  amLODipine (NORVASC) 10 MG tablet Take 10 mg by mouth daily.     [provider]  atorvastatin (LIPITOR) 40 MG tablet Take 40 mg by mouth daily.    [provider]  carvedilol (COREG) 12.5 MG tablet Take 1 tablet (12.5 mg total) by mouth 2 (two) times daily with a meal. 11/21/17   Danford, Suann Larry, MD  Chlorphen-Phenyleph-ASA (ALKA-SELTZER PLUS COLD PO) Take 1 tablet by mouth 2 (two) times daily.    [provider]  cloNIDine (CATAPRES - DOSED IN MG/24 HR) 0.2 mg/24hr patch Place 1 patch (0.2 mg total) onto the skin every Monday at 6 PM. 11/24/17   Danford, Suann Larry, MD  cloZAPine (CLOZARIL) 100 MG tablet Take 1 tablet (100 mg total) by mouth at bedtime. Patient taking differently: Take 200 mg by mouth at bedtime.  12/27/14   Maryellen Pile, MD  donepezil (ARICEPT) 10 MG tablet Take 10 mg by mouth at bedtime.    [provider]  esomeprazole (NEXIUM) 40 MG capsule Take 40 mg by mouth 2 (two) times daily.    [provider]  feeding supplement, GLUCERNA SHAKE, (GLUCERNA SHAKE) LIQD Take 237 mLs by mouth 3 (three) times daily between meals. Patient not taking:  Reported on 11/16/2017 11/09/15   Murlean Iba, MD  furosemide (LASIX) 40 MG tablet Take 40 mg by mouth daily.     [provider]  gabapentin (NEURONTIN) 100 MG capsule Take 100 mg by mouth at bedtime as needed (pain).    [provider]  haloperidol (HALDOL) 5 MG tablet Take 5-10 mg by mouth See admin instructions. Take one tablet (5 mg) by mouth every morning and two tablets (10 mg) at bedtime as tolerated    [provider]  HYDROcodone-acetaminophen (NORCO/VICODIN) 5-325 MG tablet Take 1-2 tablets by mouth every 6 (six) hours as needed for moderate pain. Patient not taking: Reported on 11/16/2017 01/22/16   Lajean Saver, MD  insulin glargine (LANTUS) 100 UNIT/ML injection Inject 15 Units into the skin at bedtime.    [provider]  memantine (NAMENDA) 10 MG tablet Take 1 tablet (10 mg total) by mouth 2 (two)  times daily. 10/09/12   Norins, Heinz Knuckles, MD  mirtazapine (REMERON) 45 MG tablet Take 45 mg by mouth at bedtime.     [provider]  potassium chloride (KLOR-CON) 8 MEQ tablet Take 1 tablet (8 mEq total) by mouth daily. Patient taking differently: Take 16 mEq by mouth daily.  04/28/12   Norins, Heinz Knuckles, MD  ranitidine (ZANTAC) 150 MG capsule Take 150 mg by mouth 2 (two) times daily.    [provider]  valsartan (DIOVAN) 160 MG tablet Take 160 mg by mouth daily.    [provider]    Physical Exam: Vitals:   11/22/17 2230 11/22/17 2257 11/22/17 2300 11/22/17 2330  BP: (!) 172/83  (!) 183/72 (!) 166/74  Pulse: 86  82 95  Resp: (!) 21  16 18   Temp:  (!) 101.4 F (38.6 C)    TempSrc:      SpO2: 93%  94% 92%  Weight:      Height:          Constitutional: NAD, calm  Eyes: PERTLA, lids and conjunctivae normal ENMT: Mucous membranes are moist. Posterior pharynx clear of any exudate or lesions.   Neck: normal, supple, no masses, no thyromegaly Respiratory: clear to auscultation bilaterally, no wheezing, no  crackles. Normal respiratory effort.   Cardiovascular: S1 & S2 heard, regular rate and rhythm. Trace pitting edema bilaterally. Abdomen: No distension, no tenderness, soft. Bowel sounds normal.  Musculoskeletal: no clubbing / cyanosis. No joint deformity upper and lower extremities.    Skin: no significant rashes, lesions, ulcers. Warm, dry, well-perfused. Neurologic: No facial asymmetry. Sensation intact. Moving all extremities.  Psychiatric: Alert and oriented to person and place, not oriented to month or year. Calm.     Labs on Admission: I have personally reviewed following labs and imaging studies  CBC: Recent Labs  Lab 11/18/17 0525 11/19/17 0615 11/21/17 0314 11/22/17 1716  WBC 12.2* 12.8* 12.4* 15.2*  NEUTROABS  --   --   --  11.7*  HGB 11.0* 12.0 11.8* 10.4*  HCT 35.1* 38.8 39.4 34.1*  MCV 94.9 95.8 99.7 96.1  PLT 269 230 208 867   Basic Metabolic Panel: Recent Labs  Lab 11/18/17 0525 11/19/17 0615 11/20/17 0401 11/21/17 0314 11/22/17 1716  NA 142 144 143 142 142  K 3.4* 4.5 4.9 4.7 3.7  CL 113* 113* 111 110 108  CO2 22 22 23 22 24   GLUCOSE 124* 152* 189* 174* 186*  BUN 30* 25* 28* 24* 22  CREATININE 1.01* 0.95 1.19* 0.97 0.94  CALCIUM 9.6 9.4 9.6 9.4 9.6   GFR: Estimated Creatinine Clearance: 42.5 mL/min (by C-G formula based on SCr of 0.94 mg/dL). Liver Function Tests: Recent Labs  Lab 11/18/17 0525 11/22/17 1716  AST 22 34  ALT 21 27  ALKPHOS 78 73  BILITOT 0.7 0.6  PROT 6.1* 6.7  ALBUMIN 3.0* 3.0*   No results for input(s): LIPASE, AMYLASE in the last 168 hours. No results for input(s): AMMONIA in the last 168 hours. Coagulation Profile: No results for input(s): INR, PROTIME in the last 168 hours. Cardiac Enzymes: Recent Labs  Lab 11/22/17 1717  CKTOTAL 784*  TROPONINI <0.03   BNP (last 3 results) No results for input(s): PROBNP in the last 8760 hours. HbA1C: No results for input(s): HGBA1C in the last 72 hours. CBG: Recent Labs    Lab 11/20/17 1116 11/20/17 1628 11/20/17 2052 11/21/17 0749 11/21/17 1205  GLUCAP 221* 126* 189* 157* 192*   Lipid  Profile: No results for input(s): CHOL, HDL, LDLCALC, TRIG, CHOLHDL, LDLDIRECT in the last 72 hours. Thyroid Function Tests: No results for input(s): TSH, T4TOTAL, FREET4, T3FREE, THYROIDAB in the last 72 hours. Anemia Panel: No results for input(s): VITAMINB12, FOLATE, FERRITIN, TIBC, IRON, RETICCTPCT in the last 72 hours. Urine analysis:    Component Value Date/Time   COLORURINE YELLOW 11/22/2017 2137   APPEARANCEUR HAZY (A) 11/22/2017 2137   LABSPEC 1.012 11/22/2017 2137   PHURINE 6.0 11/22/2017 2137   GLUCOSEU NEGATIVE 11/22/2017 2137   GLUCOSEU NEGATIVE 06/22/2008 0957   HGBUR SMALL (A) 11/22/2017 2137   HGBUR negative 12/12/2009 1102   BILIRUBINUR NEGATIVE 11/22/2017 2137   BILIRUBINUR small 11/09/2010 [place] 11/22/2017 2137   PROTEINUR NEGATIVE 11/22/2017 2137   UROBILINOGEN 1.0 12/12/2014 1100   NITRITE NEGATIVE 11/22/2017 2137   LEUKOCYTESUR NEGATIVE 11/22/2017 2137   Sepsis Labs: @LABRCNTIP (procalcitonin:4,lacticidven:4) ) Recent Results (from the past 240 hour(s))  MRSA PCR Screening     Status: None   Collection Time: 11/16/17 12:29 AM  Result Value Ref Range Status   MRSA by PCR NEGATIVE NEGATIVE Final    Comment:        The GeneXpert MRSA Assay (FDA approved for NASAL specimens only), is one component of a comprehensive MRSA colonization surveillance program. It is not intended to diagnose MRSA infection nor to guide or monitor treatment for MRSA infections. Performed at [facility], [place] [address] [place], [place] [address]   Culture, blood (Routine X 2) w Reflex to ID Panel     Status: None   Collection Time: 11/17/17  3:40 PM  Result Value Ref Range Status   Specimen Description BLOOD RIGHT HAND  Final   Special Requests   Final    BOTTLES DRAWN AEROBIC ONLY Blood Culture adequate volume   Culture    Final    NO GROWTH 5 DAYS Performed at [facility], [place] [address] [place],  [address]    Report Status 11/22/2017 FINAL  Final  Culture, blood (Routine X 2) w Reflex to ID Panel     Status: None   Collection Time: 11/17/17  3:47 PM  Result Value Ref Range Status   Specimen Description BLOOD RIGHT HAND  Final   Special Requests   Final    BOTTLES DRAWN AEROBIC ONLY Blood Culture adequate volume   Culture   Final    NO GROWTH 5 DAYS Performed at [facility], [place] [address], [place],  [address]    Report Status 11/22/2017 FINAL  Final     Radiological Exams on Admission: Dg Chest 2 View  Result Date: 11/22/2017 CLINICAL DATA:  Chest pain EXAM: CHEST - 2 VIEW COMPARISON:  11/18/2017 FINDINGS: Lungs are essentially clear. No frank interstitial edema. No pleural effusion or pneumothorax. The heart is top-normal in size. Mild eventration of the right hemidiaphragm. Lateral view is nondiagnostic due to the patient's arms. IMPRESSION: No evidence of acute cardiopulmonary disease. Electronically Signed   By: Julian Hy M.D.   On: 11/22/2017 18:23   Ct Head Wo Contrast  Result Date: 11/22/2017 CLINICAL DATA:  Found down at home.  Vomiting.  Confusion. EXAM: CT HEAD WITHOUT CONTRAST TECHNIQUE: Contiguous axial images were obtained from the base of the skull through the vertex without intravenous contrast. COMPARISON:  CT head 11/15/2017.  MRI brain 11/17/2017. FINDINGS: Brain: There is no evidence of acute intracranial hemorrhage, mass lesion, brain edema or extra-axial fluid collection. There  is stable mild generalized atrophy with mild chronic periventricular white matter disease. No hydrocephalus. There is no CT evidence of acute cortical infarction. Vascular: Intracranial vascular calcifications. No hyperdense vessel identified. Skull: Negative for fracture or focal lesion. Sinuses/Orbits: There is mild mucosal thickening throughout the left paranasal sinuses. The  mastoid air cells and middle ears are clear. Probable cerumen in the left external auditory canal. No orbital abnormalities are seen. Other: None. IMPRESSION: No acute intracranial findings. Mild mucosal thickening throughout the left paranasal sinuses. Electronically Signed   By: Richardean Sale M.D.   On: 11/22/2017 16:30    EKG: Independently reviewed. Sinus rhythm.   Assessment/Plan  1. SIRS, ?UTI  - Pt was discharged home yesterday after admission for AMS with family declining recommendation for SNF, now presenting with generalized weakness after being found down at home with vomit  - She is found to be febrile in ED with leukocytosis, stable BP, no tachycardia  - CXR is clear, UA features many bacteria but no WBC on micro, abd exam is benign, and there are no wounds or meningismus  - Blood cultures were collected in ED, 1 liter IVF was given, and she was treated with Rocephin 2 g IV for possible UTI  - Send urine for culture, follow cultures and clinical course, continue empiric cefepime for now    2. Insulin-dependent DM  - A1c was only 6.0% one year ago  - Managed at home with Lantus 15 units qHS  - Check CBG's, continue Lantus with 7 units qHS and start SSI with Novolog as needed   3. Hypertension  - Pt has hx of resistant HTN, BP slightly elevated in ED  - Continue Norvasc, Coreg, clonidine, ARB, and Lasix   4. Chronic diastolic CHF  - Appears well-compensated  - She was given 1 liter NS in ED  - SLIV now and resume Lasix in am, continue Coreg and ARB, follow daily wts    5. Dementia  - Continue Namenda, Aricept    6. Schizophrenia  - Continue Clozaril, Remeron   7. Chronic pain  - No pain complaints on admission  - Continue home regimen with Neurontin, prn APAP, and prn Norco     DVT prophylaxis: Lovenox Code Status: Full  Family Communication: Discussed with patient  Consults called: None Admission status: Observation     Vianne Bulls, MD [facility] Pager [phone]  If 7PM-7AM, please contact night-coverage [email or web address] Password [email or web address]  11/23/2017, 12:11 AM

## 2017-11-23 NOTE — Progress Notes (Signed)
Pharmacy Antibiotic Note  [name] is a 77 y.o. female admitted on 11/22/2017 with weakness and N/V, possible UTI.  Pharmacy has been consulted for Cefepime dosing.  Plan: Cefepime 1 g IV q12h  Height: 5' (152.4 cm) Weight: 141 lb (64 kg) IBW/kg (Calculated) : 45.5  Temp (24hrs), Avg:99.7 F (37.6 C), Min:98.3 F (36.8 C), Max:101.4 F (38.6 C)  Recent Labs  Lab 11/18/17 0525 11/19/17 0615 11/20/17 0401 11/21/17 0314 11/22/17 1716 11/23/17 0003  WBC 12.2* 12.8*  --  12.4* 15.2*  --   CREATININE 1.01* 0.95 1.19* 0.97 0.94  --   LATICACIDVEN  --   --   --   --   --  0.79    Estimated Creatinine Clearance: 42.5 mL/min (by C-G formula based on SCr of 0.94 mg/dL).    Allergies  Allergen Reactions  . Codeine Phosphate Other (See Comments)    Unknown reaction  . Dexilant [Dexlansoprazole] Other (See Comments)    weakness  . Glimepiride Other (See Comments)    Unknown reaction  . Glyburide Other (See Comments)    Unknown reaction  . Levofloxacin Nausea Only    Made her weak and nauseated  . Shellfish Allergy Swelling    Lip swelling  . Tramadol Other (See Comments)    Unknown reaction   Keina Mutch, [place] 11/23/2017 1:26 AM

## 2017-11-23 NOTE — ED Notes (Signed)
Nurse will collect labs. 

## 2017-11-23 NOTE — Plan of Care (Signed)

## 2017-11-23 NOTE — Progress Notes (Signed)
Ambulated Pt to and from [facility] x4 but could not urinate, in and out done at 18:10 per MD, urine output 359mL, urine sample labeled and sent to lab. Microbiology confirmed receipt of urine sample for culture.

## 2017-11-24 DIAGNOSIS — Y92009 Unspecified place in unspecified non-institutional (private) residence as the place of occurrence of the external cause: Secondary | ICD-10-CM | POA: Diagnosis not present

## 2017-11-24 DIAGNOSIS — R278 Other lack of coordination: Secondary | ICD-10-CM | POA: Diagnosis not present

## 2017-11-24 DIAGNOSIS — Z90710 Acquired absence of both cervix and uterus: Secondary | ICD-10-CM | POA: Diagnosis not present

## 2017-11-24 DIAGNOSIS — K589 Irritable bowel syndrome without diarrhea: Secondary | ICD-10-CM | POA: Diagnosis present

## 2017-11-24 DIAGNOSIS — I110 Hypertensive heart disease with heart failure: Secondary | ICD-10-CM | POA: Diagnosis present

## 2017-11-24 DIAGNOSIS — M6282 Rhabdomyolysis: Secondary | ICD-10-CM | POA: Diagnosis present

## 2017-11-24 DIAGNOSIS — M545 Low back pain: Secondary | ICD-10-CM | POA: Diagnosis not present

## 2017-11-24 DIAGNOSIS — K219 Gastro-esophageal reflux disease without esophagitis: Secondary | ICD-10-CM | POA: Diagnosis present

## 2017-11-24 DIAGNOSIS — R2689 Other abnormalities of gait and mobility: Secondary | ICD-10-CM | POA: Diagnosis not present

## 2017-11-24 DIAGNOSIS — K224 Dyskinesia of esophagus: Secondary | ICD-10-CM | POA: Diagnosis present

## 2017-11-24 DIAGNOSIS — R4182 Altered mental status, unspecified: Secondary | ICD-10-CM | POA: Diagnosis not present

## 2017-11-24 DIAGNOSIS — D509 Iron deficiency anemia, unspecified: Secondary | ICD-10-CM | POA: Diagnosis present

## 2017-11-24 DIAGNOSIS — E860 Dehydration: Secondary | ICD-10-CM | POA: Diagnosis present

## 2017-11-24 DIAGNOSIS — I5032 Chronic diastolic (congestive) heart failure: Secondary | ICD-10-CM | POA: Diagnosis present

## 2017-11-24 DIAGNOSIS — E785 Hyperlipidemia, unspecified: Secondary | ICD-10-CM | POA: Diagnosis present

## 2017-11-24 DIAGNOSIS — Z794 Long term (current) use of insulin: Secondary | ICD-10-CM | POA: Diagnosis not present

## 2017-11-24 DIAGNOSIS — R5381 Other malaise: Secondary | ICD-10-CM | POA: Diagnosis not present

## 2017-11-24 DIAGNOSIS — N3000 Acute cystitis without hematuria: Secondary | ICD-10-CM | POA: Diagnosis present

## 2017-11-24 DIAGNOSIS — I2510 Atherosclerotic heart disease of native coronary artery without angina pectoris: Secondary | ICD-10-CM | POA: Diagnosis present

## 2017-11-24 DIAGNOSIS — R41841 Cognitive communication deficit: Secondary | ICD-10-CM | POA: Diagnosis not present

## 2017-11-24 DIAGNOSIS — G9341 Metabolic encephalopathy: Secondary | ICD-10-CM | POA: Diagnosis present

## 2017-11-24 DIAGNOSIS — I10 Essential (primary) hypertension: Secondary | ICD-10-CM | POA: Diagnosis not present

## 2017-11-24 DIAGNOSIS — E119 Type 2 diabetes mellitus without complications: Secondary | ICD-10-CM | POA: Diagnosis not present

## 2017-11-24 DIAGNOSIS — A419 Sepsis, unspecified organism: Secondary | ICD-10-CM | POA: Diagnosis present

## 2017-11-24 DIAGNOSIS — F200 Paranoid schizophrenia: Secondary | ICD-10-CM | POA: Diagnosis present

## 2017-11-24 DIAGNOSIS — E876 Hypokalemia: Secondary | ICD-10-CM | POA: Diagnosis present

## 2017-11-24 DIAGNOSIS — Z8249 Family history of ischemic heart disease and other diseases of the circulatory system: Secondary | ICD-10-CM | POA: Diagnosis not present

## 2017-11-24 DIAGNOSIS — I160 Hypertensive urgency: Secondary | ICD-10-CM | POA: Diagnosis present

## 2017-11-24 DIAGNOSIS — R410 Disorientation, unspecified: Secondary | ICD-10-CM | POA: Diagnosis not present

## 2017-11-24 DIAGNOSIS — E1151 Type 2 diabetes mellitus with diabetic peripheral angiopathy without gangrene: Secondary | ICD-10-CM | POA: Diagnosis present

## 2017-11-24 DIAGNOSIS — F0390 Unspecified dementia without behavioral disturbance: Secondary | ICD-10-CM | POA: Diagnosis present

## 2017-11-24 DIAGNOSIS — N12 Tubulo-interstitial nephritis, not specified as acute or chronic: Secondary | ICD-10-CM | POA: Diagnosis present

## 2017-11-24 DIAGNOSIS — R6510 Systemic inflammatory response syndrome (SIRS) of non-infectious origin without acute organ dysfunction: Secondary | ICD-10-CM | POA: Diagnosis not present

## 2017-11-24 DIAGNOSIS — M6281 Muscle weakness (generalized): Secondary | ICD-10-CM | POA: Diagnosis not present

## 2017-11-24 DIAGNOSIS — W19XXXA Unspecified fall, initial encounter: Secondary | ICD-10-CM | POA: Diagnosis present

## 2017-11-24 DIAGNOSIS — Z743 Need for continuous supervision: Secondary | ICD-10-CM | POA: Diagnosis not present

## 2017-11-24 DIAGNOSIS — R279 Unspecified lack of coordination: Secondary | ICD-10-CM | POA: Diagnosis not present

## 2017-11-24 DIAGNOSIS — G8929 Other chronic pain: Secondary | ICD-10-CM | POA: Diagnosis present

## 2017-11-24 DIAGNOSIS — R1111 Vomiting without nausea: Secondary | ICD-10-CM | POA: Diagnosis not present

## 2017-11-24 DIAGNOSIS — Z833 Family history of diabetes mellitus: Secondary | ICD-10-CM | POA: Diagnosis not present

## 2017-11-24 LAB — URINE CULTURE: Culture: NO GROWTH

## 2017-11-24 LAB — GLUCOSE, CAPILLARY
GLUCOSE-CAPILLARY: 104 mg/dL — AB (ref 70–99)
GLUCOSE-CAPILLARY: 276 mg/dL — AB (ref 70–99)
Glucose-Capillary: 115 mg/dL — ABNORMAL HIGH (ref 70–99)

## 2017-11-24 LAB — BASIC METABOLIC PANEL
ANION GAP: 10 (ref 5–15)
BUN: 15 mg/dL (ref 8–23)
CALCIUM: 8.8 mg/dL — AB (ref 8.9–10.3)
CO2: 22 mmol/L (ref 22–32)
Chloride: 110 mmol/L (ref 98–111)
Creatinine, Ser: 1.04 mg/dL — ABNORMAL HIGH (ref 0.44–1.00)
GFR, EST AFRICAN AMERICAN: 59 mL/min — AB (ref >60)
GFR, EST NON AFRICAN AMERICAN: 51 mL/min — AB (ref >60)
Glucose, Bld: 128 mg/dL — ABNORMAL HIGH (ref 70–99)
Potassium: 3.4 mmol/L — ABNORMAL LOW (ref 3.5–5.1)
Sodium: 142 mmol/L (ref 135–145)

## 2017-11-24 LAB — CBC
HCT: 29.4 % — ABNORMAL LOW (ref 36.0–46.0)
Hemoglobin: 9.2 g/dL — ABNORMAL LOW (ref 12.0–15.0)
MCH: 29.6 pg (ref 26.0–34.0)
MCHC: 31.3 g/dL (ref 30.0–36.0)
MCV: 94.5 fL (ref 78.0–100.0)
PLATELETS: 230 10*3/uL (ref 150–400)
RBC: 3.11 MIL/uL — AB (ref 3.87–5.11)
RDW: 13.8 % (ref 11.5–15.5)
WBC: 9.1 10*3/uL (ref 4.0–10.5)

## 2017-11-24 MED ORDER — CARBAMIDE PEROXIDE 6.5 % OT SOLN
5.0000 [drp] | Freq: Two times a day (BID) | OTIC | Status: DC
  Administered 2017-11-24 – 2017-11-27 (×7): 5 [drp] via OTIC
  Filled 2017-11-24: qty 15

## 2017-11-24 MED ORDER — POTASSIUM CHLORIDE CRYS ER 20 MEQ PO TBCR
40.0000 meq | EXTENDED_RELEASE_TABLET | Freq: Every day | ORAL | Status: DC
  Administered 2017-11-24 – 2017-11-27 (×4): 40 meq via ORAL
  Filled 2017-11-24 (×4): qty 2

## 2017-11-24 NOTE — Progress Notes (Signed)
PROGRESS NOTE    [name]  IHK:[id number] DOB: Mar 06, 1941 DOA: 11/22/2017 PCP: [name], MD      Brief Narrative:  [name] is a54 y.o.F with mild dementia, community dwelling, paranoid schizophrenia, HTN, and DM who was admitted earlier this week with metabolic encephalopathy, thought to be medication induced or hypertensive encephalopathy, discharged to home --> found in the morning at home on the floor, covered in vomit and stool, and brought back to the hospital.  There found to have fever 101.60F, worsening leukocytosis, still clear CXR but new bacteriuria.  Started on antibiotics and admitted for UTI, rhabdomyolysis and ambulatory dysfunction.     Assessment & Plan:  Pyelonephritis Presents with fever, leukocytosis, vomiting and inability to stand.  Urine with bacteruria. Cultuer pending.  CXR clear.  WBC resolved, no more fever.   -Continue IV fluids -Continue Ceftriaxone -Follow WBC -Obtain Urine culture which was unfortunately not obtained in the ER   Left Ear pain Left ear obscured by wax, but does not appear bulging or red.  Will have nursing remove wax and will repeat eval tomorrow.  Acute metabolic encephalopathy At baseline, she is independent with ADLs, able to fix meals, able to hold conversations.  More alert today.  Hypertensive urgency Chronic diastolic CHF Appears euvolemic.  BP elevated, family had misunderstood and were not taking her amlodipine and furosemide -Continue amlodipine, carvedilol, furosemide, clonidine, ARB -Strict I'Os, daily weights -Continue statin  Schizophrenia Dementia -Continue Clazuril, Haldol, donepezil, memantine, mirtazapine   Diabetes -Continue glargine -Continue SSI  Chronic pain -Continue home pain Norco PRN -Continue gabapentin  Anemia, acute normocytic Likely dilutional, stable Hgb. -Trend Hgb  Hypokalemia -Continue K     DVT prophylaxis: Lovenox Code Status: FULL Family Communication:  Daughter at bedside and other by phone MDM and disposition Plan: The below labs and imaging were reviewed and summarized above.  Medication managemetn as above.     The patient was admitted with UTI/SIRS, vomiting, inability to stand and altered mentation/confusion. She is more oriented and alert but still unable to stand. Will continue IV antibiotics, fluids.  Wait for culture , given her systemic symptoms at admission, feel risk of transition to orals without speciation and microbe directed therapy is too high.      Consultants:   None  Procedures:   None  Antimicrobials:   Ceftriaxone 8/3 >>    Subjective: Feeling somewhat better.  Mentation better.  No more fever.  Oral intake minimal.  Ear pain today.    Objective: Vitals:   11/23/17 2116 11/24/17 0447 11/24/17 0614 11/24/17 0810  BP: (!) 119/44 (!) 144/54  (!) 154/60  Pulse: 66 75  78  Resp: 18 18    Temp: 99.2 F (37.3 C) 98.9 F (37.2 C)    TempSrc: Oral     SpO2: 93% 97%    Weight:   67.5 kg (148 lb 12.8 oz)   Height:        Intake/Output Summary (Last 24 hours) at 11/24/2017 1106 Last data filed at 11/24/2017 0948 Gross per 24 hour  Intake 1170.09 ml  Output 350 ml  Net 820.09 ml   Filed Weights   11/22/17 1358 11/23/17 0337 11/24/17 0614  Weight: 64 kg (141 lb) 66.8 kg (147 lb 4.8 oz) 67.5 kg (148 lb 12.8 oz)    Examination:   General: Elderly female, lying in bed, appears tired, sluggish HEENT: Right ear canal and TM normal.  Left canal occluded with wax, rim of ear drum visible,  does not appear inflamed.  Corneas clear, conjunctivae and sclerae normal without injection or icterus, lids and lashes normal.  Visual tracking smooth.  OP moist without erythema, exudates, cobblestoning, or ulcers.  No airway deformities.  Neck supple.   Cardiac: RRR, nl S1-S2, no murmurs, rubs, gallops.  No LE edema.  Respiratory: Normal respiratory rate and rhythm.  CTAB without rales or wheezes. Abdomen: BS present. Mild  nonfocal TTP.  No rebound.  Vountary guarding noted.  No ascites, distension. Extremities: No deformities/injuries.    Extremities are warm and well-perfused. Neuro: Sensorium intact but psychomotor slowing noted.  Speech is dysarthric at baseline. Motor strength symmetric but weak. Psych: Blunted affect.  Attention diminished, judgment appears poor.     Data Reviewed: I have personally reviewed following labs and imaging studies:  CBC: Recent Labs  Lab 11/19/17 0615 11/21/17 0314 11/22/17 1716 11/23/17 0304 11/24/17 0635  WBC 12.8* 12.4* 15.2* 12.3* 9.1  NEUTROABS  --   --  11.7* 8.4*  --   HGB 12.0 11.8* 10.4* 10.7* 9.2*  HCT 38.8 39.4 34.1* 34.5* 29.4*  MCV 95.8 99.7 96.1 96.1 94.5  PLT 230 208 248 240 542   Basic Metabolic Panel: Recent Labs  Lab 11/20/17 0401 11/21/17 0314 11/22/17 1716 11/23/17 0304 11/24/17 0635  NA 143 142 142 141 142  K 4.9 4.7 3.7 3.7 3.4*  CL 111 110 108 108 110  CO2 23 22 24  21* 22  GLUCOSE 189* 174* 186* 162* 128*  BUN 28* 24* 22 16 15   CREATININE 1.19* 0.97 0.94 0.85 1.04*  CALCIUM 9.6 9.4 9.6 9.4 8.8*   GFR: Estimated Creatinine Clearance: 39.4 mL/min (A) (by C-G formula based on SCr of 1.04 mg/dL (H)). Liver Function Tests: Recent Labs  Lab 11/18/17 0525 11/22/17 1716  AST 22 34  ALT 21 27  ALKPHOS 78 73  BILITOT 0.7 0.6  PROT 6.1* 6.7  ALBUMIN 3.0* 3.0*   No results for input(s): LIPASE, AMYLASE in the last 168 hours. No results for input(s): AMMONIA in the last 168 hours. Coagulation Profile: No results for input(s): INR, PROTIME in the last 168 hours. Cardiac Enzymes: Recent Labs  Lab 11/22/17 1717  CKTOTAL 784*  TROPONINI <0.03   BNP (last 3 results) No results for input(s): PROBNP in the last 8760 hours. HbA1C: No results for input(s): HGBA1C in the last 72 hours. CBG: Recent Labs  Lab 11/23/17 0749 11/23/17 1142 11/23/17 1654 11/23/17 2112 11/24/17 0754  GLUCAP 123* 126* 231* 112* 104*   Lipid  Profile: No results for input(s): CHOL, HDL, LDLCALC, TRIG, CHOLHDL, LDLDIRECT in the last 72 hours. Thyroid Function Tests: No results for input(s): TSH, T4TOTAL, FREET4, T3FREE, THYROIDAB in the last 72 hours. Anemia Panel: No results for input(s): VITAMINB12, FOLATE, FERRITIN, TIBC, IRON, RETICCTPCT in the last 72 hours. Urine analysis:    Component Value Date/Time   COLORURINE YELLOW 11/22/2017 2137   APPEARANCEUR HAZY (A) 11/22/2017 2137   LABSPEC 1.012 11/22/2017 2137   PHURINE 6.0 11/22/2017 2137   GLUCOSEU NEGATIVE 11/22/2017 2137   GLUCOSEU NEGATIVE 06/22/2008 0957   HGBUR SMALL (A) 11/22/2017 2137   HGBUR negative 12/12/2009 1102   BILIRUBINUR NEGATIVE 11/22/2017 2137   BILIRUBINUR small 11/09/2010 [place] 11/22/2017 2137   PROTEINUR NEGATIVE 11/22/2017 2137   UROBILINOGEN 1.0 12/12/2014 1100   NITRITE NEGATIVE 11/22/2017 2137   LEUKOCYTESUR NEGATIVE 11/22/2017 2137   Sepsis Labs: @LABRCNTIP (procalcitonin:4,lacticacidven:4)  ) Recent Results (from the past 240 hour(s))  MRSA PCR  Screening     Status: None   Collection Time: 11/16/17 12:29 AM  Result Value Ref Range Status   MRSA by PCR NEGATIVE NEGATIVE Final    Comment:        The GeneXpert MRSA Assay (FDA approved for NASAL specimens only), is one component of a comprehensive MRSA colonization surveillance program. It is not intended to diagnose MRSA infection nor to guide or monitor treatment for MRSA infections. Performed at [facility], [place] [address] [place], [place] [address]   Culture, blood (Routine X 2) w Reflex to ID Panel     Status: None   Collection Time: 11/17/17  3:40 PM  Result Value Ref Range Status   Specimen Description BLOOD RIGHT HAND  Final   Special Requests   Final    BOTTLES DRAWN AEROBIC ONLY Blood Culture adequate volume   Culture   Final    NO GROWTH 5 DAYS Performed at [facility], [place] [address] [place], [place] [address]    Report  Status 11/22/2017 FINAL  Final  Culture, blood (Routine X 2) w Reflex to ID Panel     Status: None   Collection Time: 11/17/17  3:47 PM  Result Value Ref Range Status   Specimen Description BLOOD RIGHT HAND  Final   Special Requests   Final    BOTTLES DRAWN AEROBIC ONLY Blood Culture adequate volume   Culture   Final    NO GROWTH 5 DAYS Performed at [facility], [place] [address] [place], [place] [address]    Report Status 11/22/2017 FINAL  Final         Radiology Studies: Dg Chest 2 View  Result Date: 11/22/2017 CLINICAL DATA:  Chest pain EXAM: CHEST - 2 VIEW COMPARISON:  11/18/2017 FINDINGS: Lungs are essentially clear. No frank interstitial edema. No pleural effusion or pneumothorax. The heart is top-normal in size. Mild eventration of the right hemidiaphragm. Lateral view is nondiagnostic due to the patient's arms. IMPRESSION: No evidence of acute cardiopulmonary disease. Electronically Signed   By: Julian Hy M.D.   On: 11/22/2017 18:23   Ct Head Wo Contrast  Result Date: 11/22/2017 CLINICAL DATA:  Found down at home.  Vomiting.  Confusion. EXAM: CT HEAD WITHOUT CONTRAST TECHNIQUE: Contiguous axial images were obtained from the base of the skull through the vertex without intravenous contrast. COMPARISON:  CT head 11/15/2017.  MRI brain 11/17/2017. FINDINGS: Brain: There is no evidence of acute intracranial hemorrhage, mass lesion, brain edema or extra-axial fluid collection. There is stable mild generalized atrophy with mild chronic periventricular white matter disease. No hydrocephalus. There is no CT evidence of acute cortical infarction. Vascular: Intracranial vascular calcifications. No hyperdense vessel identified. Skull: Negative for fracture or focal lesion. Sinuses/Orbits: There is mild mucosal thickening throughout the left paranasal sinuses. The mastoid air cells and middle ears are clear. Probable cerumen in the left external auditory canal. No orbital  abnormalities are seen. Other: None. IMPRESSION: No acute intracranial findings. Mild mucosal thickening throughout the left paranasal sinuses. Electronically Signed   By: Richardean Sale M.D.   On: 11/22/2017 16:30        Scheduled Meds: . amLODipine  10 mg Oral Daily  . atorvastatin  40 mg Oral q1800  . carvedilol  12.5 mg Oral BID WC  . cloNIDine  0.2 mg Transdermal Q Mon-1800  . cloZAPine  200 mg Oral QHS  . donepezil  10 mg Oral QHS  . enoxaparin (LOVENOX) injection  40 mg Subcutaneous Daily  . famotidine  20 mg Oral BID  . haloperidol  5 mg Oral Daily   And  . haloperidol  10 mg Oral QHS  . insulin aspart  0-5 Units Subcutaneous QHS  . insulin aspart  0-9 Units Subcutaneous TID WC  . insulin glargine  8 Units Subcutaneous QHS  . irbesartan  150 mg Oral Daily  . memantine  10 mg Oral BID  . mirtazapine  45 mg Oral QHS  . pantoprazole  40 mg Oral BID AC  . potassium chloride  40 mEq Oral Daily  . sodium chloride flush  3 mL Intravenous Q12H   Continuous Infusions: . sodium chloride 10 mL/hr at 11/23/17 1527  . cefTRIAXone (ROCEPHIN)  IV Stopped (11/23/17 2218)     LOS: 0 days    Time spent: 25 minutes    Edwin Dada, MD [facility] 11/24/2017, 11:06 AM     Pager [phone] --- please page though AMION:  [email or web address] Password [email or web address] If 7PM-7AM, please contact night-coverage

## 2017-11-24 NOTE — Progress Notes (Signed)
Received pt alert and oriented. Ambulated to room. Pt complains of headache. Pt oriented to room and use of call light.

## 2017-11-24 NOTE — Progress Notes (Signed)
IV to right  Hand infiltrated, right hand elevated on pillow.

## 2017-11-25 LAB — BASIC METABOLIC PANEL
Anion gap: 12 (ref 5–15)
BUN: 11 mg/dL (ref 8–23)
CHLORIDE: 107 mmol/L (ref 98–111)
CO2: 22 mmol/L (ref 22–32)
Calcium: 9.3 mg/dL (ref 8.9–10.3)
Creatinine, Ser: 0.97 mg/dL (ref 0.44–1.00)
GFR calc non Af Amer: 55 mL/min — ABNORMAL LOW (ref >60)
GLUCOSE: 176 mg/dL — AB (ref 70–99)
Potassium: 3.8 mmol/L (ref 3.5–5.1)
SODIUM: 141 mmol/L (ref 135–145)

## 2017-11-25 LAB — GLUCOSE, CAPILLARY
GLUCOSE-CAPILLARY: 219 mg/dL — AB (ref 70–99)
GLUCOSE-CAPILLARY: 148 mg/dL — AB (ref 70–99)
Glucose-Capillary: 146 mg/dL — ABNORMAL HIGH (ref 70–99)
Glucose-Capillary: 190 mg/dL — ABNORMAL HIGH (ref 70–99)
Glucose-Capillary: 136 mg/dL — ABNORMAL HIGH (ref 70–99)
Glucose-Capillary: 165 mg/dL — ABNORMAL HIGH (ref 70–99)

## 2017-11-25 LAB — CBC
HCT: 32.2 % — ABNORMAL LOW (ref 36.0–46.0)
HEMOGLOBIN: 10.1 g/dL — AB (ref 12.0–15.0)
MCH: 29.2 pg (ref 26.0–34.0)
MCHC: 31.4 g/dL (ref 30.0–36.0)
MCV: 93.1 fL (ref 78.0–100.0)
Platelets: 262 10*3/uL (ref 150–400)
RBC: 3.46 MIL/uL — ABNORMAL LOW (ref 3.87–5.11)
RDW: 13.6 % (ref 11.5–15.5)
WBC: 10.2 10*3/uL (ref 4.0–10.5)

## 2017-11-25 LAB — CK: CK TOTAL: 514 U/L — AB (ref 38–234)

## 2017-11-25 MED ORDER — INSULIN ASPART 100 UNIT/ML ~~LOC~~ SOLN
3.0000 [IU] | Freq: Three times a day (TID) | SUBCUTANEOUS | Status: DC
  Administered 2017-11-25 – 2017-11-27 (×6): 3 [IU] via SUBCUTANEOUS

## 2017-11-25 MED ORDER — CARVEDILOL 25 MG PO TABS
25.0000 mg | ORAL_TABLET | Freq: Two times a day (BID) | ORAL | Status: DC
  Administered 2017-11-25 – 2017-11-27 (×5): 25 mg via ORAL
  Filled 2017-11-25 (×5): qty 1

## 2017-11-25 MED ORDER — FUROSEMIDE 40 MG PO TABS
40.0000 mg | ORAL_TABLET | Freq: Every day | ORAL | Status: DC
  Administered 2017-11-25 – 2017-11-27 (×3): 40 mg via ORAL
  Filled 2017-11-25 (×3): qty 1

## 2017-11-25 NOTE — NC FL2 (Addendum)
[place] MEDICAID FL2 LEVEL OF CARE SCREENING TOOL     IDENTIFICATION  Patient Name: [name] Birthdate: 1941/02/08 Sex: female Admission Date (Current Location): 11/22/2017  [place] and [place] Number:  Kathleen Argue [id number] [place] and Address:  [facility] [place], [place] [address], [place], [place] [address]      Provider Number: [id number]  Attending Physician Name and Address:  Edwin Dada, *  Relative Name and Phone Number:  Shakeyla Giebler; daughter; [phone]    Current Level of Care: SNF Recommended Level of Care: [place] Prior Approval Number:    Date Approved/Denied:   PASRR Number: [id number]  Discharge Plan: SNF    Current Diagnoses: Patient Active Problem List   Diagnosis Date Noted  . Chronic diastolic CHF (congestive heart failure) ([place]) 11/22/2017  . Breast cancer of upper-outer quadrant of left female breast ([place]) 08/29/2015  . SIRS (systemic inflammatory response syndrome) ([place]) 12/23/2014  . Extrapyramidal disorder   . DYSPHAGIA, PHARYNGOESOPHAGEAL PHASE 02/07/2009  . TOBACCO USE, QUIT 02/03/2009  . VERTIGO 06/22/2008  . Chronic back pain 10/26/2007  . HYPERLIPIDEMIA 06/02/2007  . Dementia 06/02/2007  . Paranoid schizophrenia ([place]) 06/02/2007  . PERIPHERAL VASCULAR DISEASE 06/02/2007  . ALLERGIC RHINITIS 06/02/2007  . IBS 06/02/2007  . FATIGUE 06/02/2007  . PERIPHERAL EDEMA 06/02/2007  . Type II diabetes mellitus, well controlled ([place]) 09/04/2006  . ANEMIA-IRON DEFICIENCY 09/04/2006  . ANXIETY 09/04/2006  . PERIPHERAL NEUROPATHY 09/04/2006  . Essential hypertension 09/04/2006  . CORONARY ARTERY DISEASE 09/04/2006  . GERD 09/04/2006    Orientation RESPIRATION BLADDER Height & Weight     Self, Time, Situation, Place  Normal Continent Weight: 151 lb 6.4 oz (68.7 kg) Height:  5' (152.4 cm)  BEHAVIORAL SYMPTOMS/MOOD NEUROLOGICAL BOWEL NUTRITION STATUS      Continent Diet(see discharge  summary)  AMBULATORY STATUS COMMUNICATION OF NEEDS Skin   Limited Assist Verbally Other (Comment)(MASD on sacrum with foam dressing)                       Personal Care Assistance Level of Assistance  Bathing, Feeding, Dressing Bathing Assistance: Limited assistance Feeding assistance: Independent Dressing Assistance: Limited assistance     Functional Limitations Info  Hearing, Sight, Speech Sight Info: Adequate(wears glasses) Hearing Info: Adequate Speech Info: Adequate    SPECIAL CARE FACTORS FREQUENCY  PT (By licensed PT), OT (By licensed OT)     PT Frequency: 5x week OT Frequency: 5x week            Contractures Contractures Info: Not present    Additional Factors Info  Code Status, Allergies, Sliding Scale Code Status Info: Full Code Allergies Info: CODEINE PHOSPHATE, DEXILANT DEXLANSOPRAZOLE, GLIMEPIRIDE, GLYBURIDE, LEVOFLOXACIN, SHELLFISH ALLERGY, TRAMADOL   Sliding Scale Info: Lantus 15 units QHS          Current Medications (11/25/2017):  This is the current hospital active medication list Current Facility-Administered Medications  Medication Dose Route Frequency Provider Last Rate Last Dose  . 0.9 %  sodium chloride infusion  250 mL Intravenous PRN Opyd, Ilene Qua, MD 0 mL/hr at 11/24/17 1400    . acetaminophen (TYLENOL) tablet 650 mg  650 mg Oral Q6H PRN Vianne Bulls, MD   650 mg at 11/24/17 2331   Or  . acetaminophen (TYLENOL) suppository 650 mg  650 mg Rectal Q6H PRN Opyd, Ilene Qua, MD      . amLODipine (NORVASC) tablet 10 mg  10 mg Oral Daily Opyd, Timothy S,  MD   10 mg at 11/24/17 0946  . atorvastatin (LIPITOR) tablet 40 mg  40 mg Oral q1800 Opyd, Ilene Qua, MD   40 mg at 11/24/17 1759  . carbamide peroxide (DEBROX) 6.5 % OTIC (EAR) solution 5 drop  5 drop Left EAR BID Edwin Dada, MD   5 drop at 11/24/17 2214  . carvedilol (COREG) tablet 12.5 mg  12.5 mg Oral BID WC Opyd, Ilene Qua, MD   12.5 mg at 11/25/17 0804  . cefTRIAXone  (ROCEPHIN) 2 g in sodium chloride 0.9 % 100 mL IVPB  2 g Intravenous Q24H Edwin Dada, MD 200 mL/hr at 11/24/17 2218 2 g at 11/24/17 2218  . cloNIDine (CATAPRES - Dosed in mg/24 hr) patch 0.2 mg  0.2 mg Transdermal Q Mon-1800 Opyd, Ilene Qua, MD   0.2 mg at 11/24/17 1759  . cloZAPine (CLOZARIL) tablet 200 mg  200 mg Oral QHS Opyd, Ilene Qua, MD   200 mg at 11/24/17 2124  . donepezil (ARICEPT) tablet 10 mg  10 mg Oral QHS Opyd, Ilene Qua, MD   10 mg at 11/24/17 2124  . enoxaparin (LOVENOX) injection 40 mg  40 mg Subcutaneous Daily Opyd, Ilene Qua, MD   40 mg at 11/24/17 0947  . famotidine (PEPCID) tablet 20 mg  20 mg Oral BID Opyd, Ilene Qua, MD   20 mg at 11/24/17 2124  . gabapentin (NEURONTIN) capsule 100 mg  100 mg Oral QHS PRN Opyd, Ilene Qua, MD   100 mg at 11/23/17 1944  . haloperidol (HALDOL) tablet 5 mg  5 mg Oral Daily Opyd, Ilene Qua, MD   5 mg at 11/24/17 0945   And  . haloperidol (HALDOL) tablet 10 mg  10 mg Oral QHS Opyd, Ilene Qua, MD   10 mg at 11/24/17 2124  . hydrALAZINE (APRESOLINE) injection 10 mg  10 mg Intravenous Q4H PRN Vianne Bulls, MD   10 mg at 11/24/17 2213  . HYDROcodone-acetaminophen (NORCO/VICODIN) 5-325 MG per tablet 1-2 tablet  1-2 tablet Oral Q6H PRN Opyd, Ilene Qua, MD      . insulin aspart (novoLOG) injection 0-5 Units  0-5 Units Subcutaneous QHS Opyd, Ilene Qua, MD   2 Units at 11/24/17 2210  . insulin aspart (novoLOG) injection 0-9 Units  0-9 Units Subcutaneous TID WC Opyd, Ilene Qua, MD   1 Units at 11/25/17 0804  . insulin aspart (novoLOG) injection 3 Units  3 Units Subcutaneous TID WC Danford, Christopher P, MD      . insulin glargine (LANTUS) injection 8 Units  8 Units Subcutaneous QHS Opyd, Ilene Qua, MD   8 Units at 11/24/17 2211  . irbesartan (AVAPRO) tablet 150 mg  150 mg Oral Daily Opyd, Ilene Qua, MD   150 mg at 11/24/17 0945  . memantine (NAMENDA) tablet 10 mg  10 mg Oral BID Opyd, Ilene Qua, MD   10 mg at 11/24/17 2124  . mirtazapine  (REMERON) tablet 45 mg  45 mg Oral QHS Opyd, Ilene Qua, MD   45 mg at 11/24/17 2124  . ondansetron (ZOFRAN) tablet 4 mg  4 mg Oral Q6H PRN Opyd, Ilene Qua, MD       Or  . ondansetron (ZOFRAN) injection 4 mg  4 mg Intravenous Q6H PRN Opyd, Ilene Qua, MD      . pantoprazole (PROTONIX) EC tablet 40 mg  40 mg Oral BID AC Opyd, Ilene Qua, MD   40 mg at 11/25/17 0804  . potassium chloride SA (  K-DUR,KLOR-CON) CR tablet 40 mEq  40 mEq Oral Daily Edwin Dada, MD   40 mEq at 11/24/17 1314  . senna-docusate (Senokot-S) tablet 1 tablet  1 tablet Oral QHS PRN Opyd, Ilene Qua, MD      . sodium chloride flush (NS) 0.9 % injection 3 mL  3 mL Intravenous Q12H Opyd, Ilene Qua, MD   3 mL at 11/24/17 2213  . sodium chloride flush (NS) 0.9 % injection 3 mL  3 mL Intravenous PRN Opyd, Ilene Qua, MD         Discharge Medications: Please see discharge summary for a list of discharge medications.  Relevant Imaging Results:  Relevant Lab Results:   Additional Information SS# [place], [place]

## 2017-11-25 NOTE — Progress Notes (Signed)
Inpatient Diabetes Program Recommendations  AACE/ADA: New Consensus Statement on Inpatient Glycemic Control (2015)  Target Ranges:  Prepandial:   less than 140 mg/dL      Peak postprandial:   less than 180 mg/dL (1-2 hours)      Critically ill patients:  140 - 180 mg/dL    Results for PRINCELLA, JASKIEWICZ (MRN [id number]) as of 11/25/2017 07:41  Ref. Range 11/24/2017 07:54 11/24/2017 11:58 11/24/2017 17:30 11/24/2017 21:37  Glucose-Capillary Latest Ref Range: 70 - 99 mg/dL 104 (H)  0 units NOVOLOG  276 (H)  5 units NOVOLOG  115 (H)  0 units NOVOLOG  219 (H)  2 units NOVOLOG +  8 units LANTUS     Home DM Meds: Lantus 15 units QHS  Current Orders: Lantus 8 units QHS      Novolog Sensitive Correction Scale/ SSI (0-9 units) TID AC + HS      Patient ate well yesterday (08/05).  Ate 75-100% of meals.  Having elevated glucose levels after meals.    MD- Please consider adding Novolog Meal Coverage to current In-hospital insulin regimen:  Novolog 3 units TID with meals   (Please add the following Hold Parameters: Hold if pt eats <50% of meal, Hold if pt NPO)     --Will follow patient during hospitalization--  Wyn Quaker RN, MSN, CDE Diabetes Coordinator Inpatient Glycemic Control Team Team Pager: [phone] (8a-5p)

## 2017-11-25 NOTE — Clinical Social Work Note (Signed)
Clinical Social Work Assessment  Patient Details  Name: [name] MRN: [id number] Date of Birth: 07/26/1940  Date of referral:  11/25/17               Reason for consult:  Facility Placement, Discharge Planning                Permission sought to share information with:  Facility Sport and exercise psychologist, Family Supports Permission granted to share information::  Yes, Verbal Permission Granted  Name::     Jane agent::   SNFs ([facility] preferred)  Relationship::  daughter  Contact Information:     Housing/Transportation Living arrangements for the past 2 months:  [place] of Information:  Patient Patient Interpreter Needed:    Criminal Activity/Legal Involvement Pertinent to Current Situation/Hospitalization:  No - Comment as needed Significant Relationships:  Adult Children Lives with:  Self Do you feel safe going back to the place where you live?  No Need for family participation in patient care:  Yes (Comment)  Care giving concerns:  Pt lives alone in apartment, she has five children in [place] that come by and visit, and has an aide that comes by but she feels that she has weakened over the past month or so. During recent hospitalization pt felt that she was strong enough to go home but now is amenable to SNF.    Social Worker assessment / plan:  CSW spoke with pt at bedside, pt states that she lives alone in an apartment in [place]. Pt states she has multiple children that live in town and come by to check on her but are unable to be with her 24/7. During previous hospitalization pt refused SNF but now is amenable for "two weeks or so to get stronger." CSW explained SNF referral process. Pt requests referral sent to [facility].   Pt requests that CSW speak with pt daughter. Pt daughter agrees with request for SNF, states that her mother is usually very independent but would like SNF (preference again for [facility]). CSW reviewed SNF  process, will f/u with SNF referrals.   Employment status:  Retired Forensic scientist:  Medicare PT Recommendations:  Not assessed at this time [place] / Referral to community resources:  [place]  Patient/Family's Response to care:  Pt and pt family understanding of CSW role and referral process. Pt and pt daughter amenable to SNF placement if possible.   Patient/Family's Understanding of and Emotional Response to Diagnosis, Current Treatment, and Prognosis:  Pt and pt family state understanding of diagnosis, current treatment and prognosis. Pt states that she usually is at home with assistance from family and an aide but expressed desire for more help before returning home.   Pt emotionally flat during assessment but responds appropriately to assessment questions, requests support appropriately from her daughter.   Emotional Assessment Appearance:  Appears stated age Attitude/Demeanor/Rapport:  Gracious, Engaged Affect (typically observed):  Accepting, Appropriate, Flat, Quiet Orientation:  Oriented to Situation, Oriented to  Time, Oriented to Place, Oriented to Self Alcohol / Substance use:  Tobacco Use(former smoker) Psych involvement (Current and /or in the community):  Outpatient Provider  Discharge Needs  Concerns to be addressed:  Discharge Planning Concerns Readmission within the last 30 days:  No Current discharge risk:  Lives alone, Physical Impairment Barriers to Discharge:  Joseph Tour manager), Continued Medical Work up   [employer], [place] 11/25/2017, 10:08 AM

## 2017-11-25 NOTE — Evaluation (Addendum)
Physical Therapy Evaluation Patient Details Name: [name] MRN: [id number] DOB: 1940-09-16 Today's Date: 11/25/2017   History of Present Illness  [name] is 77 y.o. F who presented to the ED with generalized weakness 8/03 after vomiting and falling. Pt had been d/c home after family declined SNF 8/02. PMH includes dementia, paranoid schizophrenia, HTN, T2DM, CHF, extrapyramidal disorder, LBP.   Clinical Impression  Pt presents with problems above and deficits below. Pt performed sit<>stand transfers and ambulated with RW and MinG. Pt presents with unsteadiness and increased pain, which limited gait distance. Pt is at increased risk for falls and has limited ability to perform ADLs. Will continue to follow acutely to support independence, mobility, and safety.     Follow Up Recommendations SNF;Supervision/Assistance - 24 hour    Equipment Recommendations  None recommended by PT    Recommendations for Other Services OT consult     Precautions / Restrictions Precautions Precautions: Fall Restrictions Weight Bearing Restrictions: No      Mobility  Bed Mobility               General bed mobility comments: Pt in chair upon arrival  Transfers Overall transfer level: Needs assistance Equipment used: Rolling walker (2 wheeled) Transfers: Sit to/from Stand Sit to Stand: Min guard         General transfer comment: Pt performed sit<>stand using RW and MinG. Pt relied on rocking for momentum to stand. Pt required cues for handplacement and sequencing. Pt also required increased time.   Ambulation/Gait Ambulation/Gait assistance: Min guard Gait Distance (Feet): 40 Feet Assistive device: Rolling walker (2 wheeled) Gait Pattern/deviations: Trunk flexed;Wide base of support;Step-through pattern;Decreased stride length Gait velocity: Decreased   General Gait Details: Pt ambulated with RW and MinG. Pt takes short steps and is flexed at the trunk, even with cues to  stand tall. Pt moves slowly, and sways from side to side with ambulation. Pt is reliant on RW for balance and seems unsteady. Pt reported L hip pain when performing L turn. Pt required cues for safe use of RW. Pt continued to complain of hip pain when returned to chair. Pt left with in chair with legs not elevated due to hip pain.   Stairs            Wheelchair Mobility    Modified Rankin (Stroke Patients Only)       Balance Overall balance assessment: Needs assistance Sitting-balance support: Feet supported;Bilateral upper extremity supported Sitting balance-Leahy Scale: Poor Sitting balance - Comments: Pt in recliner with back support and feet on the ground. Pt resistant to lift arms while PT put on gait belt.    Standing balance support: Bilateral upper extremity supported Standing balance-Leahy Scale: Poor Standing balance comment: Pt reliant on RW for support.                              Pertinent Vitals/Pain Pain Assessment: 0-10 Pain Score: 10-Worst pain ever Pain Location: Back, L hip Pain Descriptors / Indicators: Grimacing;Guarding Pain Intervention(s): Limited activity within patient's tolerance;Monitored during session;Repositioned    Home Living Family/patient expects to be discharged to:: Skilled nursing facility(Prefers [name])                      Prior Function Level of Independence: Independent with assistive device(s)         Comments: Pt reports using Rollator     Hand Dominance  Extremity/Trunk Assessment   Upper Extremity Assessment Upper Extremity Assessment: Defer to OT evaluation    Lower Extremity Assessment Lower Extremity Assessment: Generalized weakness;LLE deficits/detail LLE Deficits / Details: Guarded due to pain    Cervical / Trunk Assessment Cervical / Trunk Assessment: Other exceptions Cervical / Trunk Exceptions: Pt stands with trunk flexed even when cued to stand up tall   Communication   Communication: No difficulties  Cognition Arousal/Alertness: Awake/alert Behavior During Therapy: WFL for tasks assessed/performed Overall Cognitive Status: No family/caregiver present to determine baseline cognitive functioning                                 General Comments: 4x oriented. Would occasionally comment about unrelated topics during conversation. Able to follow simple directions.       General Comments General comments (skin integrity, edema, etc.): Pt is pleasant. Complaining of back and hip pain.    Exercises     Assessment/Plan    PT Assessment Patient needs continued PT services  PT Problem List Decreased strength;Decreased range of motion;Decreased activity tolerance;Decreased balance;Decreased mobility;Decreased knowledge of use of DME;Pain       PT Treatment Interventions DME instruction;Gait training;Functional mobility training;Therapeutic activities;Therapeutic exercise;Balance training;Patient/family education    PT Goals (Current goals can be found in the Care Plan section)  Acute Rehab PT Goals Patient Stated Goal: To go to rehab PT Goal Formulation: With patient Time For Goal Achievement: 12/09/17 Potential to Achieve Goals: Fair    Frequency Min 2X/week   Barriers to discharge        Co-evaluation               AM-PAC PT "6 Clicks" Daily Activity  Outcome Measure Difficulty turning over in bed (including adjusting bedclothes, sheets and blankets)?: Unable Difficulty moving from lying on back to sitting on the side of the bed? : Unable Difficulty sitting down on and standing up from a chair with arms (e.g., wheelchair, bedside commode, etc,.)?: Unable Help needed moving to and from a bed to chair (including a wheelchair)?: A Little Help needed walking in hospital room?: A Little Help needed climbing 3-5 steps with a railing? : A Lot 6 Click Score: 11    End of Session Equipment Utilized During  Treatment: Gait belt Activity Tolerance: Patient limited by pain Patient left: in chair;with call bell/phone within reach;with chair alarm set;with family/visitor present Nurse Communication: Mobility status PT Visit Diagnosis: Other abnormalities of gait and mobility (R26.89);Unsteadiness on feet (R26.81);Muscle weakness (generalized) (M62.81);History of falling (Z91.81);Difficulty in walking, not elsewhere classified (R26.2);Pain Pain - Right/Left: Left Pain - part of body: Hip(Back)    Time: 4982-6415 PT Time Calculation (min) (ACUTE ONLY): 14 min    Charges:   PT Evaluation $PT Eval Low Complexity: [place], S-DPT [place] [phone]    11/25/2017, 2:59 PM

## 2017-11-25 NOTE — Social Work (Signed)
Spoke with pt's preferred SNF, they would like to see PT note for skillable need prior to accepting pt. CSW notes that attending MD has ordered this.   [place], [place] Work [phone]

## 2017-11-25 NOTE — Progress Notes (Signed)
PROGRESS NOTE    [name]  UUV:[id number] DOB: 08/03/1940 DOA: 11/22/2017 PCP: Jane Amel, MD      Brief Narrative:  [name] is a14 y.o.F with mild dementia, community dwelling, paranoid schizophrenia usually compensated, HTN, and DM who was admitted earlier this week with metabolic encephalopathy, thought to be medication induced or hypertensive encephalopathy, discharged to home --> found the next day at home on the floor, covered in vomit and stool, and brought back to the hospital.  There found to have fever 101.70F, worsening leukocytosis, still clear CXR but new bacteriuria.  Started on antibiotics and admitted for UTI, rhabdomyolysis and ambulatory dysfunction.     Assessment & Plan:  SIRS likely pyelonephritis Presented with fever, leukocytosis, vomiting and inability to stand.  CXR clear and BCx negative, but urine microscopy with bacteria.  Started on empiric ceftriaxone.  WBC resolved, no more fever.     Urine culture no growth. I would favor that this is because UCx obtained after two doses broad spectrum antibiotics, but cannot rule out that fever was elsewhere, in particular LEFT acute otitis media. -Continue IV ceftriaxone    Left Ear pain Left ear obscured by wax.  Attempted lavage but no success.  Discussed with ENT, given clinical improvement on Ceftriaxone, will: -Narrow to Cefdinir for d/c (cover UTI and AOM).  Dr. Noreene Filbert office are working this afternoon to schedule her for Thurs Aug 8 in the afternoon.  Family will transport her from SNF.   -If AOM, Dr. Erik Obey can transition to Augmentin or other.  Rhabdomyolysis Given IV fluids.   Mild, from being found down. -Repeat CK  Acute metabolic encephalopathy At baseline, she is independent with ADLs, able to fix meals, able to hold conversations.  Mostly resolved, still extremely weak.  Hypertensive urgency Chronic diastolic CHF Appears euvolemic.  BP elevated still.  On amlodipine, ARB and  furosemide before last admission.  Clonidine and carvedilol added. -Continue amlodipine, carvedilol, clonidine, ARB -Restart Lasix -Continue statin  Schizophrenia Dementia -Continue clozapine, Haldol, donepezil, memantine, mirtazapine   Diabetes Well-controlled -Continue glargine and SSI  Chronic pain -Continue home Norco, PRN -Continue gabapentin   Anemia, acute normocytic Likely dilutional, stable Hgb.  No clinical bleeding. -Trend Hgb  Hypokalemia -Continue K     DVT prophylaxis: Lovenox Code Status: FULL Family Communication: Daughter by phone MDM and disposition Plan: The below labs and imaging reports were reviewed and summarized above.  Medication management as above.     The patient was admitted with UTI/SIRS, vomiting, inability to stand and altered mentation/confusion.   She continues to be more oriented, but is still extremely weak and unable to stand.  She was previously discharged to home and quickly readmitted, after our recommendation for SNF discharge.  At present, a safe discharge plan and SNF placement are still pending, so we will continue IV antibiotics, and trend her CK.  Likely discharge tomorrow.      Consultants:   None  Procedures:   None  Antimicrobials:   Ceftriaxone 8/3 >>    Subjective: No more fever, confusion, vomiting.  Poor oral intake, but better than yesterday.  No cough, sputum.  Ear pain no significant change, no ear drainage.  No dysuria, urinary frequency.  Objective: Vitals:   11/25/17 0128 11/25/17 0355 11/25/17 0800 11/25/17 1409  BP: (!) 144/65 (!) 162/64 (!) 166/65 (!) 178/72  Pulse: 74 80 89 87  Resp:    16  Temp:  98.8 F (37.1 C)  98.3 F (36.8 C)  TempSrc:  Oral  Oral  SpO2: 97% 98%  99%  Weight:   68.7 kg (151 lb 6.4 oz)   Height:        Intake/Output Summary (Last 24 hours) at 11/25/2017 1605 Last data filed at 11/25/2017 0745 Gross per 24 hour  Intake 700 ml  Output 3400 ml  Net -2700 ml    Filed Weights   11/23/17 0337 11/24/17 0614 11/25/17 0800  Weight: 66.8 kg (147 lb 4.8 oz) 67.5 kg (148 lb 12.8 oz) 68.7 kg (151 lb 6.4 oz)    Examination:   General: Elderly female, lying in bed, tired but interactive HEENT: Left ear canal has been divided, but is still occluded by wax, corneas and conjunctive are clear, moves limbs and lashes normal.  No nasal deformity or discharge or epistaxis.  Oropharynx moist, no oral lesions, hearing normal.   Cardiac: RRR, no murmurs, no lower extremity edema. Respiratory: Normal respiratory rate and rhythm, lungs clear without rales or wheezes Abdomen: No tenderness to palpation, no rebound, voluntary guarding, no ascites or distention. Extremities: No deformities/injuries.    Extremities are warm and well-perfused. Neuro: Sensorium intact but psychomotor slowing still noted.  Speech is dysarthric at baseline.  Motor strength symmetric but 5-/5 in upper extremities bilaterally, 4/5 or weaker in legs. Psych: Blunted affect, attention normal, judgment appears poor.   Data Reviewed: I have personally reviewed following labs and imaging studies:  CBC: Recent Labs  Lab 11/21/17 0314 11/22/17 1716 11/23/17 0304 11/24/17 0635 11/25/17 0509  WBC 12.4* 15.2* 12.3* 9.1 10.2  NEUTROABS  --  11.7* 8.4*  --   --   HGB 11.8* 10.4* 10.7* 9.2* 10.1*  HCT 39.4 34.1* 34.5* 29.4* 32.2*  MCV 99.7 96.1 96.1 94.5 93.1  PLT 208 248 240 230 725   Basic Metabolic Panel: Recent Labs  Lab 11/21/17 0314 11/22/17 1716 11/23/17 0304 11/24/17 0635 11/25/17 0509  NA 142 142 141 142 141  K 4.7 3.7 3.7 3.4* 3.8  CL 110 108 108 110 107  CO2 22 24 21* 22 22  GLUCOSE 174* 186* 162* 128* 176*  BUN 24* 22 16 15 11   CREATININE 0.97 0.94 0.85 1.04* 0.97  CALCIUM 9.4 9.6 9.4 8.8* 9.3   GFR: Estimated Creatinine Clearance: 42.7 mL/min (by C-G formula based on SCr of 0.97 mg/dL). Liver Function Tests: Recent Labs  Lab 11/22/17 1716  AST 34  ALT 27   ALKPHOS 73  BILITOT 0.6  PROT 6.7  ALBUMIN 3.0*   No results for input(s): LIPASE, AMYLASE in the last 168 hours. No results for input(s): AMMONIA in the last 168 hours. Coagulation Profile: No results for input(s): INR, PROTIME in the last 168 hours. Cardiac Enzymes: Recent Labs  Lab 11/22/17 1717  CKTOTAL 784*  TROPONINI <0.03   BNP (last 3 results) No results for input(s): PROBNP in the last 8760 hours. HbA1C: No results for input(s): HGBA1C in the last 72 hours. CBG: Recent Labs  Lab 11/24/17 1730 11/24/17 2137 11/25/17 0753 11/25/17 1152 11/25/17 1325  GLUCAP 115* 219* 146* 190* 148*   Lipid Profile: No results for input(s): CHOL, HDL, LDLCALC, TRIG, CHOLHDL, LDLDIRECT in the last 72 hours. Thyroid Function Tests: No results for input(s): TSH, T4TOTAL, FREET4, T3FREE, THYROIDAB in the last 72 hours. Anemia Panel: No results for input(s): VITAMINB12, FOLATE, FERRITIN, TIBC, IRON, RETICCTPCT in the last 72 hours. Urine analysis:    Component Value Date/Time   COLORURINE YELLOW 11/22/2017 2137   APPEARANCEUR HAZY (A) 11/22/2017  2137   LABSPEC 1.012 11/22/2017 2137   PHURINE 6.0 11/22/2017 2137   GLUCOSEU NEGATIVE 11/22/2017 2137   GLUCOSEU NEGATIVE 06/22/2008 0957   HGBUR SMALL (A) 11/22/2017 2137   HGBUR negative 12/12/2009 1102   BILIRUBINUR NEGATIVE 11/22/2017 2137   BILIRUBINUR small 11/09/2010 [place] 11/22/2017 2137   PROTEINUR NEGATIVE 11/22/2017 2137   UROBILINOGEN 1.0 12/12/2014 1100   NITRITE NEGATIVE 11/22/2017 2137   LEUKOCYTESUR NEGATIVE 11/22/2017 2137   Sepsis Labs: @LABRCNTIP (procalcitonin:4,lacticacidven:4)  ) Recent Results (from the past 240 hour(s))  MRSA PCR Screening     Status: None   Collection Time: 11/16/17 12:29 AM  Result Value Ref Range Status   MRSA by PCR NEGATIVE NEGATIVE Final    Comment:        The GeneXpert MRSA Assay (FDA approved for NASAL specimens only), is one component of a comprehensive  MRSA colonization surveillance program. It is not intended to diagnose MRSA infection nor to guide or monitor treatment for MRSA infections. Performed at [facility], [place] [address], [place], [place] [address]   Culture, blood (Routine X 2) w Reflex to ID Panel     Status: None   Collection Time: 11/17/17  3:40 PM  Result Value Ref Range Status   Specimen Description BLOOD RIGHT HAND  Final   Special Requests   Final    BOTTLES DRAWN AEROBIC ONLY Blood Culture adequate volume   Culture   Final    NO GROWTH 5 DAYS Performed at [facility], [place] [address], [place], [place] [address]    Report Status 11/22/2017 FINAL  Final  Culture, blood (Routine X 2) w Reflex to ID Panel     Status: None   Collection Time: 11/17/17  3:47 PM  Result Value Ref Range Status   Specimen Description BLOOD RIGHT HAND  Final   Special Requests   Final    BOTTLES DRAWN AEROBIC ONLY Blood Culture adequate volume   Culture   Final    NO GROWTH 5 DAYS Performed at [facility], [place] [address], [place], [place] [address]    Report Status 11/22/2017 FINAL  Final  Blood Culture (routine x 2)     Status: None (Preliminary result)   Collection Time: 11/22/17 11:40 PM  Result Value Ref Range Status   Specimen Description BLOOD RIGHT FOREARM  Final   Special Requests   Final    BOTTLES DRAWN AEROBIC AND ANAEROBIC Blood Culture results may not be optimal due to an inadequate volume of blood received in culture bottles   Culture   Final    NO GROWTH 2 DAYS Performed at [facility], [place] [address] [place], [place] [address]    Report Status PENDING  Incomplete  Blood Culture (routine x 2)     Status: None (Preliminary result)   Collection Time: 11/22/17 11:55 PM  Result Value Ref Range Status   Specimen Description BLOOD LEFT HAND  Final   Special Requests   Final    BOTTLES DRAWN AEROBIC AND ANAEROBIC Blood Culture results may not be optimal due to an inadequate volume of blood  received in culture bottles   Culture   Final    NO GROWTH 2 DAYS Performed at [facility], [place] [address], [place],  [address]    Report Status PENDING  Incomplete  Urine culture     Status: None   Collection Time: 11/23/17 12:11 AM  Result Value  Ref Range Status   Specimen Description URINE, RANDOM  Final   Special Requests NONE  Final   Culture   Final    NO GROWTH Performed at [facility], [address], [place], [place] [address]    Report Status 11/24/2017 FINAL  Final         Radiology Studies: No results found.      Scheduled Meds: . amLODipine  10 mg Oral Daily  . atorvastatin  40 mg Oral q1800  . carbamide peroxide  5 drop Left EAR BID  . carvedilol  12.5 mg Oral BID WC  . cloNIDine  0.2 mg Transdermal Q Mon-1800  . cloZAPine  200 mg Oral QHS  . donepezil  10 mg Oral QHS  . enoxaparin (LOVENOX) injection  40 mg Subcutaneous Daily  . famotidine  20 mg Oral BID  . haloperidol  5 mg Oral Daily   And  . haloperidol  10 mg Oral QHS  . insulin aspart  0-5 Units Subcutaneous QHS  . insulin aspart  0-9 Units Subcutaneous TID WC  . insulin aspart  3 Units Subcutaneous TID WC  . insulin glargine  8 Units Subcutaneous QHS  . irbesartan  150 mg Oral Daily  . memantine  10 mg Oral BID  . mirtazapine  45 mg Oral QHS  . pantoprazole  40 mg Oral BID AC  . potassium chloride  40 mEq Oral Daily  . sodium chloride flush  3 mL Intravenous Q12H   Continuous Infusions: . sodium chloride 0 mL/hr at 11/24/17 1400  . cefTRIAXone (ROCEPHIN)  IV 2 g (11/24/17 2218)     LOS: 1 day    Time spent: 25 minutes    Edwin Dada, MD [facility] 11/25/2017, 4:05 PM     Pager [phone] --- please page though AMION:  [email or web address] If 7PM-7AM, please contact night-coverage

## 2017-11-26 DIAGNOSIS — A419 Sepsis, unspecified organism: Principal | ICD-10-CM

## 2017-11-26 DIAGNOSIS — G9341 Metabolic encephalopathy: Secondary | ICD-10-CM

## 2017-11-26 DIAGNOSIS — N3000 Acute cystitis without hematuria: Secondary | ICD-10-CM

## 2017-11-26 LAB — CBC
HCT: 31.2 % — ABNORMAL LOW (ref 36.0–46.0)
HEMOGLOBIN: 9.3 g/dL — AB (ref 12.0–15.0)
MCH: 28.9 pg (ref 26.0–34.0)
MCHC: 29.8 g/dL — ABNORMAL LOW (ref 30.0–36.0)
MCV: 96.9 fL (ref 78.0–100.0)
PLATELETS: 221 10*3/uL (ref 150–400)
RBC: 3.22 MIL/uL — AB (ref 3.87–5.11)
RDW: 13.9 % (ref 11.5–15.5)
WBC: 9.1 10*3/uL (ref 4.0–10.5)

## 2017-11-26 LAB — BASIC METABOLIC PANEL
Anion gap: 11 (ref 5–15)
BUN: 10 mg/dL (ref 8–23)
CHLORIDE: 109 mmol/L (ref 98–111)
CO2: 21 mmol/L — ABNORMAL LOW (ref 22–32)
Calcium: 9.4 mg/dL (ref 8.9–10.3)
Creatinine, Ser: 1 mg/dL (ref 0.44–1.00)
GFR, EST NON AFRICAN AMERICAN: 53 mL/min — AB (ref >60)
Glucose, Bld: 154 mg/dL — ABNORMAL HIGH (ref 70–99)
POTASSIUM: 4.2 mmol/L (ref 3.5–5.1)
SODIUM: 141 mmol/L (ref 135–145)

## 2017-11-26 LAB — GLUCOSE, CAPILLARY
GLUCOSE-CAPILLARY: 113 mg/dL — AB (ref 70–99)
GLUCOSE-CAPILLARY: 168 mg/dL — AB (ref 70–99)
Glucose-Capillary: 147 mg/dL — ABNORMAL HIGH (ref 70–99)
Glucose-Capillary: 178 mg/dL — ABNORMAL HIGH (ref 70–99)

## 2017-11-26 NOTE — Progress Notes (Signed)
PROGRESS NOTE    [name]  HDQ:[id number] DOB: 07-04-1940 DOA: 11/22/2017 PCP: [name], MD    Brief Narrative:  Mrs. Musleh is a65 y.o.F with mild dementia, community dwelling, paranoid schizophrenia usually compensated, HTN, and DM who was admitted earlier this week with metabolic encephalopathy, thought to be medication induced or hypertensive encephalopathy, discharged to home --> found the next day at home on the floor, covered in vomit and stool, and brought back to the hospital.  There found to have fever 101.63F, worsening leukocytosis, still clear CXR but new bacteriuria.  Started on antibiotics and admitted for UTI, rhabdomyolysis and ambulatory dysfunction.     Assessment & Plan:   Principal Problem:   SIRS (systemic inflammatory response syndrome) (HCC) Active Problems:   Type II diabetes mellitus, well controlled (HCC)   ANEMIA-IRON DEFICIENCY   Dementia   Paranoid schizophrenia ([place])   Essential hypertension   Acute cystitis without hematuria   Chronic back pain   Chronic diastolic CHF (congestive heart failure) (HCC)   Metabolic encephalopathy   Sepsis ([place])  SIRS likely pyelonephritis Presented with fever, leukocytosis, vomiting and inability to stand.    Chest x-ray done was unremarkable.  Blood cultures with no growth to date.  Urine microscopy with bacteriuria.  WBC trended down.  Fever curve is improved.  Patient on IV Rocephin.  Urine cultures were negative however patient had received 2 doses of broad-spectrum antibiotics prior to urine cultures being obtained.  Patient also with a left questionable acute otitis media.  Continue empiric IV Rocephin and likely transition to oral antibiotics on discharge.      Left Ear pain Left ear obscured by wax.  Attempted lavage but no success.    Dr. Loleta Books discussed case with ENT as such we will continue IV Rocephin and on discharge will transition to Cefdinir for d/c (cover UTI and AOM).  Dr. Noreene Filbert  office are working to schedule her for [facility] Aug 8 in the afternoon.  As patient is still hospitalized may need to reschedule outpatient appointment. Family will transport her from SNF.   -If AOM, Dr. Erik Obey can transition to Augmentin or other.  Rhabdomyolysis Mild secondary to being found down.  Check a CK in the morning.  Clinical improvement.   Acute metabolic encephalopathy Improved.  Likely close to baseline. Patient answering questions appropriately.  Patient is independent with ADLs, able to fix meals, able to hold conversations.   Extremely weak.  Hypertensive urgency Chronic diastolic CHF Currently euvolemic.  Blood pressure has improved.  Continue current regimen of Norvasc, Coreg, clonidine patch, Lasix, Avapro, statin.   Schizophrenia Dementia -Continue clozapine, Haldol, donepezil, memantine, mirtazapine   Diabetes Well-controlled.  Check a hemoglobin A1c.  CBG of 147 this morning.  Continue current regimen of Lantus and sliding scale insulin.  Chronic pain -Stable.  Continue current regimen of Norco and gabapentin.  Anemia, acute normocytic Likely dilutional, hemoglobin currently stable.  Follow H&H.    Hypokalemia -Potassium repleted.  Monitor closely with potassium supplementation.  Follow.       DVT prophylaxis: Lovenox Code Status: Full Family Communication: No family at bedside.  Disposition Plan: Skilled nursing facility when bed available.   Consultants:   None  Procedures:   CT head 11/22/2017  Chest x-ray 11/22/2017    Antimicrobials:   IV Rocephin 11/22/2017   Subjective: Sitting up eating her lunch.  Denies any chest pain no shortness of breath.  States she feels better than on admission.  Patient denies any ear  pain.  Objective: Vitals:   11/26/17 0008 11/26/17 0512 11/26/17 0858 11/26/17 1413  BP: (!) 167/75 (!) 126/58  119/66  Pulse:  68  64  Resp:  16  18  Temp:  98.8 F (37.1 C)  98.7 F (37.1 C)  TempSrc:  Oral  Oral   SpO2:  92%  100%  Weight:   68.5 kg (151 lb)   Height:        Intake/Output Summary (Last 24 hours) at 11/26/2017 2016 Last data filed at 11/26/2017 1900 Gross per 24 hour  Intake 820 ml  Output 308 ml  Net 512 ml   Filed Weights   11/24/17 0614 11/25/17 0800 11/26/17 0858  Weight: 67.5 kg (148 lb 12.8 oz) 68.7 kg (151 lb 6.4 oz) 68.5 kg (151 lb)    Examination:  General exam: Appears calm and comfortable  Respiratory system: Clear to auscultation. Respiratory effort normal. Cardiovascular system: S1 & S2 heard, RRR. No JVD, murmurs, rubs, gallops or clicks. No pedal edema. Gastrointestinal system: Abdomen is nondistended, soft and nontender. No organomegaly or masses felt. Normal bowel sounds heard. Central nervous system: Alert and oriented. No focal neurological deficits. Extremities: Symmetric 5 x 5 power. Skin: No rashes, lesions or ulcers Psychiatry: Judgement and insight appear normal. Mood & affect appropriate.     Data Reviewed: I have personally reviewed following labs and imaging studies  CBC: Recent Labs  Lab 11/22/17 1716 11/23/17 0304 11/24/17 0635 11/25/17 0509 11/26/17 0538  WBC 15.2* 12.3* 9.1 10.2 9.1  NEUTROABS 11.7* 8.4*  --   --   --   HGB 10.4* 10.7* 9.2* 10.1* 9.3*  HCT 34.1* 34.5* 29.4* 32.2* 31.2*  MCV 96.1 96.1 94.5 93.1 96.9  PLT 248 240 230 262 300   Basic Metabolic Panel: Recent Labs  Lab 11/22/17 1716 11/23/17 0304 11/24/17 0635 11/25/17 0509 11/26/17 0538  NA 142 141 142 141 141  K 3.7 3.7 3.4* 3.8 4.2  CL 108 108 110 107 109  CO2 24 21* 22 22 21*  GLUCOSE 186* 162* 128* 176* 154*  BUN 22 16 15 11 10   CREATININE 0.94 0.85 1.04* 0.97 1.00  CALCIUM 9.6 9.4 8.8* 9.3 9.4   GFR: Estimated Creatinine Clearance: 41.3 mL/min (by C-G formula based on SCr of 1 mg/dL). Liver Function Tests: Recent Labs  Lab 11/22/17 1716  AST 34  ALT 27  ALKPHOS 73  BILITOT 0.6  PROT 6.7  ALBUMIN 3.0*   No results for input(s): LIPASE,  AMYLASE in the last 168 hours. No results for input(s): AMMONIA in the last 168 hours. Coagulation Profile: No results for input(s): INR, PROTIME in the last 168 hours. Cardiac Enzymes: Recent Labs  Lab 11/22/17 1717 11/25/17 1703  CKTOTAL 784* 514*  TROPONINI <0.03  --    BNP (last 3 results) No results for input(s): PROBNP in the last 8760 hours. HbA1C: No results for input(s): HGBA1C in the last 72 hours. CBG: Recent Labs  Lab 11/25/17 1708 11/25/17 2052 11/26/17 0747 11/26/17 1210 11/26/17 1712  GLUCAP 136* 165* 147* 178* 113*   Lipid Profile: No results for input(s): CHOL, HDL, LDLCALC, TRIG, CHOLHDL, LDLDIRECT in the last 72 hours. Thyroid Function Tests: No results for input(s): TSH, T4TOTAL, FREET4, T3FREE, THYROIDAB in the last 72 hours. Anemia Panel: No results for input(s): VITAMINB12, FOLATE, FERRITIN, TIBC, IRON, RETICCTPCT in the last 72 hours. Sepsis Labs: Recent Labs  Lab 11/23/17 0003 11/23/17 0304  LATICACIDVEN 0.79 1.0    Recent Results (from  the past 240 hour(s))  Culture, blood (Routine X 2) w Reflex to ID Panel     Status: None   Collection Time: 11/17/17  3:40 PM  Result Value Ref Range Status   Specimen Description BLOOD RIGHT HAND  Final   Special Requests   Final    BOTTLES DRAWN AEROBIC ONLY Blood Culture adequate volume   Culture   Final    NO GROWTH 5 DAYS Performed at [facility], [address] [place], [place] [address]    Report Status 11/22/2017 FINAL  Final  Culture, blood (Routine X 2) w Reflex to ID Panel     Status: None   Collection Time: 11/17/17  3:47 PM  Result Value Ref Range Status   Specimen Description BLOOD RIGHT HAND  Final   Special Requests   Final    BOTTLES DRAWN AEROBIC ONLY Blood Culture adequate volume   Culture   Final    NO GROWTH 5 DAYS Performed at [facility], [place] [address], [place], [place] [address]    Report Status 11/22/2017 FINAL  Final  Blood Culture (routine x 2)      Status: None (Preliminary result)   Collection Time: 11/22/17 11:40 PM  Result Value Ref Range Status   Specimen Description BLOOD RIGHT FOREARM  Final   Special Requests   Final    BOTTLES DRAWN AEROBIC AND ANAEROBIC Blood Culture results may not be optimal due to an inadequate volume of blood received in culture bottles   Culture   Final    NO GROWTH 3 DAYS Performed at [facility], [place] [address], [place], [place] [address]    Report Status PENDING  Incomplete  Blood Culture (routine x 2)     Status: None (Preliminary result)   Collection Time: 11/22/17 11:55 PM  Result Value Ref Range Status   Specimen Description BLOOD LEFT HAND  Final   Special Requests   Final    BOTTLES DRAWN AEROBIC AND ANAEROBIC Blood Culture results may not be optimal due to an inadequate volume of blood received in culture bottles   Culture   Final    NO GROWTH 3 DAYS Performed at [facility], [place] [address] [place], [place] [address]    Report Status PENDING  Incomplete  Urine culture     Status: None   Collection Time: 11/23/17 12:11 AM  Result Value Ref Range Status   Specimen Description URINE, RANDOM  Final   Special Requests NONE  Final   Culture   Final    NO GROWTH Performed at [facility], [address] [place], [place] [address]    Report Status 11/24/2017 FINAL  Final         Radiology Studies: No results found.      Scheduled Meds: . amLODipine  10 mg Oral Daily  . atorvastatin  40 mg Oral q1800  . carbamide peroxide  5 drop Left EAR BID  . carvedilol  25 mg Oral BID WC  . cloNIDine  0.2 mg Transdermal Q Mon-1800  . cloZAPine  200 mg Oral QHS  . donepezil  10 mg Oral QHS  . enoxaparin (LOVENOX) injection  40 mg Subcutaneous Daily  . famotidine  20 mg Oral BID  . furosemide  40 mg Oral Daily  . haloperidol  5 mg Oral Daily   And  . haloperidol  10 mg Oral QHS  . insulin aspart  0-5 Units Subcutaneous QHS  .  insulin aspart  0-9 Units  Subcutaneous TID WC  . insulin aspart  3 Units Subcutaneous TID WC  . insulin glargine  8 Units Subcutaneous QHS  . irbesartan  150 mg Oral Daily  . memantine  10 mg Oral BID  . mirtazapine  45 mg Oral QHS  . pantoprazole  40 mg Oral BID AC  . potassium chloride  40 mEq Oral Daily  . sodium chloride flush  3 mL Intravenous Q12H   Continuous Infusions: . sodium chloride 0 mL/hr at 11/24/17 1400  . cefTRIAXone (ROCEPHIN)  IV 2 g (11/25/17 2111)     LOS: 2 days    Time spent: 35 minutes    Irine Seal, MD [facility] Pager [phone]  If 7PM-7AM, please contact night-coverage [email or web address] Password [email or web address] 11/26/2017, 8:16 PM

## 2017-11-26 NOTE — Evaluation (Signed)
Occupational Therapy Evaluation Patient Details Name: [name] MRN: [id number] DOB: 29-Dec-1940 Today's Date: 11/26/2017    History of Present Illness [name] is 77 y.o. F who presented to the ED with generalized weakness 8/03 after vomiting and falling. Pt had been d/c home after family declined SNF 8/02. PMH includes dementia, paranoid schizophrenia, HTN, T2DM, CHF, extrapyramidal disorder, LBP.    Clinical Impression   Pt typically walks with a rollator and is independent in self care. Pt limited by back pain this visit, requiring moderate assistance for LB ADL. Pt instructed in log rolling for bed mobility for comfort. She performed toileting and standing grooming with min guard assist. Will follow acutely. Recommending continued rehab in SNF and pt is agreeable.     Follow Up Recommendations  SNF;Supervision/Assistance - 24 hour    Equipment Recommendations       Recommendations for Other Services       Precautions / Restrictions Precautions Precautions: Fall Restrictions Weight Bearing Restrictions: No      Mobility Bed Mobility Overal bed mobility: Needs Assistance Bed Mobility: Rolling;Sidelying to Sit;Sit to Sidelying Rolling: Min assist Sidelying to sit: Mod assist     Sit to sidelying: Mod assist General bed mobility comments: instructed in log rolling for comfort, assist to raise trunk and for LEs back into bed  Transfers Overall transfer level: Needs assistance Equipment used: Rolling walker (2 wheeled) Transfers: Sit to/from Stand Sit to Stand: Min guard         General transfer comment: slow to rise, min guard for safety, cues for hand placement    Balance Overall balance assessment: Needs assistance   Sitting balance-Leahy Scale: Fair       Standing balance-Leahy Scale: Poor Standing balance comment: Pt reliant on RW for support.                            ADL either performed or assessed with clinical judgement    ADL Overall ADL's : Needs assistance/impaired Eating/Feeding: Independent;Sitting   Grooming: Wash/dry hands;Standing;Min guard   Upper Body Bathing: Minimal assistance;Sitting   Lower Body Bathing: Moderate assistance;Sit to/from stand   Upper Body Dressing : Minimal assistance;Sitting   Lower Body Dressing: Moderate assistance;Sit to/from stand   Toilet Transfer: Min guard;RW;Ambulation   Toileting- Water quality scientist and Hygiene: Min guard;Sit to/from stand       Functional mobility during ADLs: Min guard;Rolling walker General ADL Comments: LB ADL hindered by back pain     Vision Baseline Vision/History: Wears glasses Wears Glasses: Reading only Patient Visual Report: No change from baseline       Perception     Praxis      Pertinent Vitals/Pain Pain Assessment: Faces Faces Pain Scale: Hurts even more Pain Location: back Pain Descriptors / Indicators: Grimacing;Guarding Pain Intervention(s): RN gave pain meds during session;Repositioned;Heat applied     Hand Dominance Right   Extremity/Trunk Assessment Upper Extremity Assessment Upper Extremity Assessment: Overall WFL for tasks assessed   Lower Extremity Assessment Lower Extremity Assessment: Defer to PT evaluation   Cervical / Trunk Assessment Cervical / Trunk Assessment: Other exceptions Cervical / Trunk Exceptions: chronic back pain, flexed posture   Communication Communication Communication: No difficulties   Cognition Arousal/Alertness: Awake/alert Behavior During Therapy: WFL for tasks assessed/performed Overall Cognitive Status: No family/caregiver present to determine baseline cognitive functioning  General Comments: somewhat slow processing, following commands and oriented   General Comments       Exercises     Shoulder Instructions      Home Living Family/patient expects to be discharged to:: Skilled nursing facility                                         Prior Functioning/Environment Level of Independence: Independent with assistive device(s)        Comments: Pt reports using Rollator        OT Problem List: Decreased strength;Decreased activity tolerance;Impaired balance (sitting and/or standing);Increased edema;Decreased knowledge of use of DME or AE      OT Treatment/Interventions: Self-care/ADL training;DME and/or AE instruction;Patient/family education;Balance training;Therapeutic activities    OT Goals(Current goals can be found in the care plan section) Acute Rehab OT Goals Patient Stated Goal: To go to rehab OT Goal Formulation: With patient Time For Goal Achievement: 12/10/17 Potential to Achieve Goals: Good ADL Goals Pt Will Perform Grooming: with supervision;standing Pt Will Perform Lower Body Bathing: with supervision;with adaptive equipment;sit to/from stand Pt Will Perform Lower Body Dressing: with supervision;sit to/from stand;with adaptive equipment Pt Will Transfer to Toilet: with supervision;ambulating Pt Will Perform Toileting - Clothing Manipulation and hygiene: with supervision;sit to/from stand  OT Frequency: Min 2X/week   Barriers to D/C:            Co-evaluation              AM-PAC PT "6 Clicks" Daily Activity     Outcome Measure Help from another person eating meals?: None Help from another person taking care of personal grooming?: A Little Help from another person toileting, which includes using toliet, bedpan, or urinal?: A Little Help from another person bathing (including washing, rinsing, drying)?: A Lot Help from another person to put on and taking off regular upper body clothing?: A Little Help from another person to put on and taking off regular lower body clothing?: A Lot 6 Click Score: 17   End of Session Equipment Utilized During Treatment: Gait belt;Rolling walker Nurse Communication: Patient requests pain meds  Activity Tolerance:  Patient limited by pain Patient left: in bed;with call bell/phone within reach  OT Visit Diagnosis: Other abnormalities of gait and mobility (R26.89);Unsteadiness on feet (R26.81);Pain;Muscle weakness (generalized) (M62.81)                Time: 9233-0076 OT Time Calculation (min): 20 min Charges:  OT General Charges $OT Visit: 1 Visit OT Evaluation $OT Eval Moderate Complexity: 1 Mod  11/26/2017 Nestor Lewandowsky, OTR/L Pager: [phone]  Werner Lean, Haze Boyden 11/26/2017, 12:15 PM

## 2017-11-26 NOTE — Social Work (Addendum)
CSW received call from bedside RN Di Kindle, stating that a member of the pt's ACT team called her. Unsure who ACT team representative is, HIPPA compliant message left at given number [phone].  3:17pm- Spoke with Natasha Bence with [facility], her ACT team member. ACT team office number is [phone], ACT team state it is vital for her to maintain current psychotropic medication regimine.   3:28pm- CSW spoke with pt daughter [name], she understands that [facility] will have bed tomorrow if a current resident discharges. CSW will send further information regarding additional bed offers to [email or web address]. Pt daughter Theophilus Kinds will complete paperwork and support pt discharge when medically appropriate.    Alexander Mt, [place] Work [phone]

## 2017-11-27 DIAGNOSIS — F419 Anxiety disorder, unspecified: Secondary | ICD-10-CM | POA: Diagnosis not present

## 2017-11-27 DIAGNOSIS — E119 Type 2 diabetes mellitus without complications: Secondary | ICD-10-CM | POA: Diagnosis not present

## 2017-11-27 DIAGNOSIS — N390 Urinary tract infection, site not specified: Secondary | ICD-10-CM | POA: Diagnosis not present

## 2017-11-27 DIAGNOSIS — L89152 Pressure ulcer of sacral region, stage 2: Secondary | ICD-10-CM | POA: Diagnosis not present

## 2017-11-27 DIAGNOSIS — R410 Disorientation, unspecified: Secondary | ICD-10-CM | POA: Diagnosis not present

## 2017-11-27 DIAGNOSIS — M545 Low back pain: Secondary | ICD-10-CM | POA: Diagnosis not present

## 2017-11-27 DIAGNOSIS — R6889 Other general symptoms and signs: Secondary | ICD-10-CM | POA: Diagnosis not present

## 2017-11-27 DIAGNOSIS — H6122 Impacted cerumen, left ear: Secondary | ICD-10-CM | POA: Diagnosis not present

## 2017-11-27 DIAGNOSIS — I5032 Chronic diastolic (congestive) heart failure: Secondary | ICD-10-CM | POA: Diagnosis not present

## 2017-11-27 DIAGNOSIS — N3000 Acute cystitis without hematuria: Secondary | ICD-10-CM | POA: Diagnosis not present

## 2017-11-27 DIAGNOSIS — F0390 Unspecified dementia without behavioral disturbance: Secondary | ICD-10-CM | POA: Diagnosis not present

## 2017-11-27 DIAGNOSIS — Z743 Need for continuous supervision: Secondary | ICD-10-CM | POA: Diagnosis not present

## 2017-11-27 DIAGNOSIS — D509 Iron deficiency anemia, unspecified: Secondary | ICD-10-CM | POA: Diagnosis not present

## 2017-11-27 DIAGNOSIS — I2510 Atherosclerotic heart disease of native coronary artery without angina pectoris: Secondary | ICD-10-CM | POA: Diagnosis not present

## 2017-11-27 DIAGNOSIS — R278 Other lack of coordination: Secondary | ICD-10-CM | POA: Diagnosis not present

## 2017-11-27 DIAGNOSIS — I509 Heart failure, unspecified: Secondary | ICD-10-CM | POA: Diagnosis not present

## 2017-11-27 DIAGNOSIS — F200 Paranoid schizophrenia: Secondary | ICD-10-CM | POA: Diagnosis not present

## 2017-11-27 DIAGNOSIS — R6510 Systemic inflammatory response syndrome (SIRS) of non-infectious origin without acute organ dysfunction: Secondary | ICD-10-CM | POA: Diagnosis not present

## 2017-11-27 DIAGNOSIS — F209 Schizophrenia, unspecified: Secondary | ICD-10-CM | POA: Diagnosis not present

## 2017-11-27 DIAGNOSIS — A419 Sepsis, unspecified organism: Secondary | ICD-10-CM | POA: Diagnosis not present

## 2017-11-27 DIAGNOSIS — M549 Dorsalgia, unspecified: Secondary | ICD-10-CM | POA: Diagnosis not present

## 2017-11-27 DIAGNOSIS — R279 Unspecified lack of coordination: Secondary | ICD-10-CM | POA: Diagnosis not present

## 2017-11-27 DIAGNOSIS — K219 Gastro-esophageal reflux disease without esophagitis: Secondary | ICD-10-CM | POA: Diagnosis not present

## 2017-11-27 DIAGNOSIS — G8929 Other chronic pain: Secondary | ICD-10-CM | POA: Diagnosis not present

## 2017-11-27 DIAGNOSIS — I10 Essential (primary) hypertension: Secondary | ICD-10-CM | POA: Diagnosis not present

## 2017-11-27 DIAGNOSIS — G9341 Metabolic encephalopathy: Secondary | ICD-10-CM | POA: Diagnosis not present

## 2017-11-27 DIAGNOSIS — R2689 Other abnormalities of gait and mobility: Secondary | ICD-10-CM | POA: Diagnosis not present

## 2017-11-27 DIAGNOSIS — R1111 Vomiting without nausea: Secondary | ICD-10-CM | POA: Diagnosis not present

## 2017-11-27 DIAGNOSIS — F321 Major depressive disorder, single episode, moderate: Secondary | ICD-10-CM | POA: Diagnosis not present

## 2017-11-27 DIAGNOSIS — M6281 Muscle weakness (generalized): Secondary | ICD-10-CM | POA: Diagnosis not present

## 2017-11-27 DIAGNOSIS — N12 Tubulo-interstitial nephritis, not specified as acute or chronic: Secondary | ICD-10-CM | POA: Diagnosis not present

## 2017-11-27 DIAGNOSIS — R5381 Other malaise: Secondary | ICD-10-CM | POA: Diagnosis not present

## 2017-11-27 DIAGNOSIS — R41841 Cognitive communication deficit: Secondary | ICD-10-CM | POA: Diagnosis not present

## 2017-11-27 DIAGNOSIS — E785 Hyperlipidemia, unspecified: Secondary | ICD-10-CM | POA: Diagnosis not present

## 2017-11-27 LAB — CBC
HCT: 30.8 % — ABNORMAL LOW (ref 36.0–46.0)
Hemoglobin: 9.4 g/dL — ABNORMAL LOW (ref 12.0–15.0)
MCH: 29.1 pg (ref 26.0–34.0)
MCHC: 30.5 g/dL (ref 30.0–36.0)
MCV: 95.4 fL (ref 78.0–100.0)
PLATELETS: 292 10*3/uL (ref 150–400)
RBC: 3.23 MIL/uL — AB (ref 3.87–5.11)
RDW: 14 % (ref 11.5–15.5)
WBC: 9 10*3/uL (ref 4.0–10.5)

## 2017-11-27 LAB — BASIC METABOLIC PANEL
Anion gap: 12 (ref 5–15)
BUN: 14 mg/dL (ref 8–23)
CO2: 22 mmol/L (ref 22–32)
CREATININE: 1.21 mg/dL — AB (ref 0.44–1.00)
Calcium: 9.4 mg/dL (ref 8.9–10.3)
Chloride: 108 mmol/L (ref 98–111)
GFR calc Af Amer: 49 mL/min — ABNORMAL LOW (ref >60)
GFR, EST NON AFRICAN AMERICAN: 42 mL/min — AB (ref >60)
Glucose, Bld: 124 mg/dL — ABNORMAL HIGH (ref 70–99)
POTASSIUM: 3.9 mmol/L (ref 3.5–5.1)
Sodium: 142 mmol/L (ref 135–145)

## 2017-11-27 LAB — GLUCOSE, CAPILLARY
GLUCOSE-CAPILLARY: 169 mg/dL — AB (ref 70–99)
Glucose-Capillary: 135 mg/dL — ABNORMAL HIGH (ref 70–99)
Glucose-Capillary: 134 mg/dL — ABNORMAL HIGH (ref 70–99)

## 2017-11-27 LAB — HEMOGLOBIN A1C
Hgb A1c MFr Bld: 6.7 % — ABNORMAL HIGH (ref 4.8–5.6)
Mean Plasma Glucose: 145.59 mg/dL

## 2017-11-27 MED ORDER — HYDROCODONE-ACETAMINOPHEN 5-325 MG PO TABS: 1.0000 | ORAL_TABLET | Freq: Four times a day (QID) | ORAL | 0 refills | Status: DC | PRN

## 2017-11-27 MED ORDER — FLUTICASONE PROPIONATE 50 MCG/ACT NA SUSP
2.0000 | Freq: Every day | NASAL | Status: DC
  Administered 2017-11-27: 2 via NASAL
  Filled 2017-11-27: qty 16

## 2017-11-27 MED ORDER — SENNOSIDES-DOCUSATE SODIUM 8.6-50 MG PO TABS: 1.0000 | ORAL_TABLET | Freq: Every evening | ORAL | Status: DC | PRN

## 2017-11-27 MED ORDER — CEFDINIR 300 MG PO CAPS: 300.0000 mg | ORAL_CAPSULE | Freq: Two times a day (BID) | ORAL | 0 refills | Status: AC

## 2017-11-27 MED ORDER — INSULIN GLARGINE 100 UNIT/ML ~~LOC~~ SOLN: 8.0000 [IU] | Freq: Every day | SUBCUTANEOUS | 0 refills | Status: DC

## 2017-11-27 MED ORDER — FLUTICASONE PROPIONATE 50 MCG/ACT NA SUSP: 2.0000 | Freq: Every day | NASAL | 0 refills | Status: DC

## 2017-11-27 MED ORDER — LORATADINE 10 MG PO TABS: 10.0000 mg | ORAL_TABLET | Freq: Every day | ORAL | Status: DC

## 2017-11-27 MED ORDER — INSULIN ASPART 100 UNIT/ML ~~LOC~~ SOLN: 3.0000 [IU] | Freq: Three times a day (TID) | SUBCUTANEOUS | 0 refills | Status: DC

## 2017-11-27 MED ORDER — LORATADINE 10 MG PO TABS
10.0000 mg | ORAL_TABLET | Freq: Every day | ORAL | Status: DC
  Administered 2017-11-27: 10 mg via ORAL
  Filled 2017-11-27: qty 1

## 2017-11-27 MED ORDER — CARBAMIDE PEROXIDE 6.5 % OT SOLN: 5.0000 [drp] | Freq: Two times a day (BID) | OTIC | 0 refills | Status: AC

## 2017-11-27 MED ORDER — CLOZAPINE 100 MG PO TABS: 200.0000 mg | ORAL_TABLET | Freq: Every day | ORAL | Status: DC

## 2017-11-27 NOTE — Consult Note (Signed)
[facility] CM Primary Care Navigator  11/27/2017  [name] November 24, 1940 [id number]   Met with patientat the bedsideto identify possible discharge needs. Patientreports thatshe had "weakness, dizziness, fainted and fell that resulted to this admission. (SIRS- systemic inflammatory response syndrome likely pyelonephritis, sepsis, acute metabolic encephalopathy)  Patientendorses Dr. Lauretta Grill Koirala with [place] [facility] as her primary care provider.    [place] to obtain medications withoutany problem.  Patientreports thatdaughter [name]) has been helping her manage her medications at [facility] use of "pill packs".  Patientverbalized thather children have been providing Chief Technology Officer' appointments.  Patientreports living by herself but daughter has been her primary caregiver at home.   Anticipateddischargeplan isskilled nursing facility per therapy recommendation (SNF).    Patientvoiced understanding to call primary care provider's office whenshe getsback home, for a post discharge follow-up within1- 2weeksor sooner if needs arise. Patient letter (with PCP's contact number) was provided asareminder.   Discussed with patientregarding [facility] CM services available for health managementand resourcesat home but indicated that her health conditions are under control. Patientencouraged to talk with primary care provider on her next visit about needs for further management of health issues when discharge to home.  Patient verbalizedunderstandingto seekreferralfrom primary care provider to [facility] care management ifdeemed necessary and appropriatefor services in the nearfuture- once she getsback home.  [facility] care management information was provided for future needsthatshe may have.  Primary care provider's office is listed as providing transition of care  (TOC) follow-up.     For additional questions please contact:  Edwena Felty A. Norvella Loscalzo, BSN, RN-BC [facility] PRIMARY CARE Navigator Cell: [phone]

## 2017-11-27 NOTE — Clinical Social Work Placement (Signed)
   CLINICAL SOCIAL WORK PLACEMENT  NOTE [facility]  RN to call report to [phone]  Date:  11/27/2017  Patient Details  Name: [name] MRN: [id number] Date of Birth: 24-Oct-1940  Clinical Social Work is seeking post-discharge placement for this patient at the [place] level of care (*CSW will initial, date and re-position this form in  chart as items are completed):  Yes   Patient/family provided with [place] Work Department's list of facilities offering this level of care within the geographic area requested by the patient (or if unable, by the patient's family).  Yes   Patient/family informed of their freedom to choose among providers that offer the needed level of care, that participate in Medicare, Medicaid or managed care program needed by the patient, have an available bed and are willing to accept the patient.  Yes   Patient/family informed of [facility]'s ownership interest in [facility] and [facility], as well as of the fact that they are under no obligation to receive care at these facilities.  PASRR submitted to EDS on       PASRR number received on       Existing PASRR number confirmed on 11/25/17     FL2 transmitted to all facilities in geographic area requested by pt/family on 11/25/17     FL2 transmitted to all facilities within larger geographic area on       Patient informed that his/her managed care company has contracts with or will negotiate with certain facilities, including the following:        Yes   Patient/family informed of bed offers received.  Patient chooses bed at [facility]     Physician recommends and patient chooses bed at      Patient to be transferred to [facility] on  .  Patient to be transferred to facility by [facility]     Patient family notified on 11/27/17 of transfer.  Name of family member notified:  daughter, Theophilus Kinds     PHYSICIAN Please prepare  priority discharge summary, including medications, Please prepare prescriptions     Additional Comment:    _______________________________________________ Alexander Mt, LCSWA 11/27/2017, 10:03 AM

## 2017-11-27 NOTE — Progress Notes (Signed)
Physical Therapy Treatment Patient Details Name: [name] MRN: [id number] DOB: 1940/08/29 Today's Date: 11/27/2017    History of Present Illness Zaara Sprowl is 77 y.o. F who presented to the ED with generalized weakness 8/03 after vomiting and falling. Pt had been d/c home after family declined SNF 8/02. PMH includes dementia, paranoid schizophrenia, HTN, T2DM, CHF, extrapyramidal disorder, LBP.     PT Comments    Pt performed gait training and functional mobility with min guard assistance for safety.  Pt continues to report pain in LLE and noticeably weaker on L side with activities in supine.  Continue to recommend SNF to improve strength and function before returning home.  Pt remains motivated to participate.    Follow Up Recommendations  SNF;Supervision/Assistance - 24 hour     Equipment Recommendations  None recommended by PT    Recommendations for Other Services       Precautions / Restrictions Precautions Precautions: Fall Restrictions Weight Bearing Restrictions: No    Mobility  Bed Mobility Overal bed mobility: Needs Assistance Bed Mobility: Sit to Supine       Sit to supine: Supervision   General bed mobility comments: increased time but no assistance needed.    Transfers Overall transfer level: Needs assistance Equipment used: Rolling walker (2 wheeled) Transfers: Sit to/from Stand Sit to Stand: Min guard         General transfer comment: slow to rise, min guard for safety, cues for hand placement. Poor eccentric loading returning to seated surface.    Ambulation/Gait Ambulation/Gait assistance: Min guard Gait Distance (Feet): 120 Feet Assistive device: Rolling walker (2 wheeled) Gait Pattern/deviations: Trunk flexed;Wide base of support;Step-through pattern;Decreased stride length Gait velocity: Decreased   General Gait Details: Cues for trunk and hip extension, obstacle negotiation and pacing.     Stairs             Wheelchair  Mobility    Modified Rankin (Stroke Patients Only)       Balance Overall balance assessment: Needs assistance Sitting-balance support: Feet supported;Bilateral upper extremity supported Sitting balance-Leahy Scale: Fair       Standing balance-Leahy Scale: Poor Standing balance comment: Pt reliant on RW for support.                             Cognition Arousal/Alertness: Awake/alert Behavior During Therapy: WFL for tasks assessed/performed Overall Cognitive Status: History of cognitive impairments - at baseline                                 General Comments: dementia at baseline      Exercises Total Joint Exercises Ankle Circles/Pumps: AROM;Both;Supine;20 reps Quad Sets: AROM;Both;10 reps;Supine Heel Slides: AROM;Both;10 reps;Supine Hip ABduction/ADduction: AROM;Both;10 reps;Supine    General Comments        Pertinent Vitals/Pain Pain Assessment: Faces Pain Score: 4  Pain Location: L leg Pain Descriptors / Indicators: Grimacing;Guarding Pain Intervention(s): Monitored during session;Repositioned    Home Living                      Prior Function            PT Goals (current goals can now be found in the care plan section) Acute Rehab PT Goals Patient Stated Goal: To go to rehab Potential to Achieve Goals: Good Progress towards PT goals: Progressing toward goals    Frequency  Min 2X/week      PT Plan Current plan remains appropriate    Co-evaluation              AM-PAC PT "6 Clicks" Daily Activity  Outcome Measure  Difficulty turning over in bed (including adjusting bedclothes, sheets and blankets)?: Unable Difficulty moving from lying on back to sitting on the side of the bed? : Unable Difficulty sitting down on and standing up from a chair with arms (e.g., wheelchair, bedside commode, etc,.)?: Unable Help needed moving to and from a bed to chair (including a wheelchair)?: A Little Help needed  walking in hospital room?: A Little Help needed climbing 3-5 steps with a railing? : A Lot 6 Click Score: 11    End of Session Equipment Utilized During Treatment: Gait belt Activity Tolerance: Patient limited by pain Patient left: in chair;with call bell/phone within reach;with chair alarm set;with family/visitor present Nurse Communication: Mobility status PT Visit Diagnosis: Other abnormalities of gait and mobility (R26.89);Unsteadiness on feet (R26.81);Muscle weakness (generalized) (M62.81);History of falling (Z91.81);Difficulty in walking, not elsewhere classified (R26.2);Pain Pain - Right/Left: Left Pain - part of body: (Back)     Time: 6578-4696 PT Time Calculation (min) (ACUTE ONLY): 15 min  Charges:  $Therapeutic Activity: 8-22 mins                     Governor Rooks, PTA pager [phone]    Cristela Blue 11/27/2017, 2:58 PM

## 2017-11-27 NOTE — Discharge Summary (Signed)
Physician Discharge Summary  [name] LNL:[id number] DOB: 1940/05/06 DOA: 11/22/2017  PCP: [name], MD  Admit date: 11/22/2017 Discharge date: 11/27/2017  Time spent: 65 minutes  Recommendations for Outpatient Follow-up:  1. Follow-up with [facility] ENT as scheduled. 2. Follow-up with MD at skilled nursing facility.  Patient will need a basic metabolic profile done in 1 week as well as a CBC done to follow-up on electrolytes, renal function and hemoglobin.   Discharge Diagnoses:  Principal Problem:   SIRS (systemic inflammatory response syndrome) (HCC) Active Problems:   Type II diabetes mellitus, well controlled (HCC)   ANEMIA-IRON DEFICIENCY   Dementia   Paranoid schizophrenia ([place])   Essential hypertension   Acute cystitis without hematuria   Chronic back pain   Chronic diastolic CHF (congestive heart failure) ([place])   Metabolic encephalopathy   Sepsis ([place])   Discharge Condition: Stable and improved  Diet recommendation: Heart healthy  Filed Weights   11/25/17 0800 11/26/17 0858 11/27/17 0448  Weight: 68.7 kg 68.5 kg 67.3 kg    History of present illness:  Per Dr Jenetta Downer is a 77 y.o. female with medical history significant for dementia, paranoid schizophrenia, resistant hypertension, coronary artery disease, chronic diastolic CHF, and insulin-dependent diabetes mellitus, who presented to the emergency department after being found on the floor with vomit.  Patient had been discharged from the hospital 1 day prior to admission, for altered mental status, during which she was evaluated by psychiatry, improved, and was discharged home after family declined the recommended SNF placement.    On day of admission, patient was found on the floor after apparently vomiting.  She seemed generally weak, but did not have any complaints.  Family brought her into the ED for evaluation.  She had not been complaining of anything leading up to this and denied any pain on  arrival.  ED Course: Upon arrival to the ED, patient was found to be febrile to 38.6 C, saturating adequately on room air, and with vitals otherwise stable.  EKG features a sinus rhythm and chest x-ray is negative for acute cardiopulmonary disease.  Noncontrast head CT was negative for acute intracranial abnormality.  Chemistry panel was unremarkable and CBC was notable for an increased leukocytosis, now 15,200, and a normocytic anemia with hemoglobin 10.4.  Troponin was undetectable, BNP was 784, ethanol and UDS are negative, and urinalysis is notable for many bacteria with no WBC on micro.  Blood cultures were collected, 1 L of lactated Ringer's given, and the patient was treated with empiric Rocephin 2 g IV for suspected UTI.  She remained hemodynamically stable, in no apparent respiratory distress, and will be observed for ongoing evaluation and management of SIRS.  Hospital Course:  SIRS likely pyelonephritis Presentedwith fever, leukocytosis, vomiting and inability to stand.   Chest x-ray done was unremarkable.  Blood cultures with no growth to date.  Urine microscopy with bacteriuria.  WBC trended down.  Fever curve improved.  Patient placed empirically on IV Rocephin.  Urine cultures were negative however patient had received 2 doses of broad-spectrum antibiotics prior to urine cultures being obtained.  Patient also with a left questionable acute otitis media.    Patient maintained on IV Rocephin with clinical improvement.  Patient will be transition to oral Cefdinir for 4 more days to complete a one-week course of antibiotic treatment.  Outpatient follow-up.     Left Ear pain Left ear obscured by wax. Attempted lavage but no success.   Dr. Loleta Books discussed case  with ENT as such patient maintained on IV Rocephin during the hospitalization and will be discharged to a skilled nursing facility, on 4 more days of oral Cefdinir for d/c (cover UTI and AOM). Dr. Noreene Filbert office are working to  schedule outpatient follow-up.  Patient will be discharged to a skilled nursing facility and family will transport her from skilled nursing facility to her ENT appointment.  Patient be discharged on 4 more days of oral antibiotics and further antibiotics will be deferred to ENT.   Rhabdomyolysis Mild secondary to being found down.  Improved clinically with hydration.  Outpatient follow-up.  Acute metabolic encephalopathy On admission patient noted to have an acute metabolic encephalopathy.  It was felt likely secondary to systemic inflammatory response/probable UTI.  Patient was pancultured.  Patient placed empirically on IV antibiotics.  Patient improved clinically and was close to baseline by day of discharge.  Patient however was noted to be very weak will be discharged to a skilled nursing facility.   Hypertensive urgency Chronic diastolic CHF Patient remained euvolemic during the hospitalization.  Patient was maintained on home regimen of Norvasc, Coreg, clonidine patch, Lasix, Avapro and statin.  Outpatient follow-up.    Schizophrenia Dementia -Patient was maintained on home regimen ofclozapine, Haldol, donepezil, memantine, mirtazapine  Diabetes II Well-controlled.    Hemoglobin A1c was 6.7.  Patient was maintained on half home dose of Lantus 8 units daily as well as meal coverage insulin which should be discharged to skilled nursing facility on.  Outpatient follow-up.   Chronic pain -Patient was maintained on Norco and gabapentin during the hospitalization for chronic pain.  Outpatient follow-up.   Anemia, acute normocytic Likely dilutional, hemoglobin stabilized at 9.4.  Outpatient follow-up.    Hypokalemia -Potassium repleted.    Patient maintained on potassium supplementation.  Outpatient follow-up.      Procedures:  CT head 11/22/2017  Chest x-ray 11/22/2017  Consultations:  None  Discharge Exam: Vitals:   11/27/17 0530 11/27/17 1329  BP: 128/62 120/80   Pulse: 72 70  Resp: 16 17  Temp: 98.7 F (37.1 C) 98.7 F (37.1 C)  SpO2: 100% 100%    General: NAD Cardiovascular: RRR Respiratory: CTAB  Discharge Instructions   Discharge Instructions    Diet - low sodium heart healthy   Complete by:  As directed    Increase activity slowly   Complete by:  As directed      Allergies as of 11/27/2017      Reactions   Codeine Phosphate Other (See Comments)   Unknown reaction   Dexilant [dexlansoprazole] Other (See Comments)   weakness   Glimepiride Other (See Comments)   Unknown reaction   Glyburide Other (See Comments)   Unknown reaction   Levofloxacin Nausea Only   Made her weak and nauseated   Shellfish Allergy Swelling   Lip swelling   Tramadol Other (See Comments)   Unknown reaction      Medication List    TAKE these medications   ACETAMINOPHEN-PAMABROM PO Take 1 tablet by mouth 2 (two) times daily. "Back Aid"   ALKA-SELTZER PLUS COLD PO Take 1 tablet by mouth 2 (two) times daily.   amLODipine 10 MG tablet Commonly known as:  NORVASC Take 10 mg by mouth daily.   atorvastatin 40 MG tablet Commonly known as:  LIPITOR Take 40 mg by mouth daily.   carbamide peroxide 6.5 % OTIC solution Commonly known as:  DEBROX Place 5 drops into the left ear 2 (two) times daily for 7  days.   carvedilol 12.5 MG tablet Commonly known as:  COREG Take 1 tablet (12.5 mg total) by mouth 2 (two) times daily with a meal.   cefdinir 300 MG capsule Commonly known as:  OMNICEF Take 1 capsule (300 mg total) by mouth 2 (two) times daily for 4 days.   cloNIDine 0.2 mg/24hr patch Commonly known as:  CATAPRES - Dosed in mg/24 hr Place 1 patch (0.2 mg total) onto the skin every Monday at 6 PM.   cloZAPine 100 MG tablet Commonly known as:  CLOZARIL Take 2 tablets (200 mg total) by mouth at bedtime.   donepezil 10 MG tablet Commonly known as:  ARICEPT Take 10 mg by mouth at bedtime.   esomeprazole 40 MG capsule Commonly known as:   NEXIUM Take 40 mg by mouth 2 (two) times daily.   feeding supplement (GLUCERNA SHAKE) Liqd Take 237 mLs by mouth 3 (three) times daily between meals.   fluticasone 50 MCG/ACT nasal spray Commonly known as:  FLONASE Place 2 sprays into both nostrils daily. Start taking on:  11/28/2017   furosemide 40 MG tablet Commonly known as:  LASIX Take 40 mg by mouth daily.   gabapentin 100 MG capsule Commonly known as:  NEURONTIN Take 100 mg by mouth at bedtime as needed (pain).   haloperidol 5 MG tablet Commonly known as:  HALDOL Take 5-10 mg by mouth See admin instructions. Take one tablet (5 mg) by mouth every morning and two tablets (10 mg) at bedtime as tolerated   HYDROcodone-acetaminophen 5-325 MG tablet Commonly known as:  NORCO/VICODIN Take 1-2 tablets by mouth every 6 (six) hours as needed for moderate pain.   insulin aspart 100 UNIT/ML injection Commonly known as:  novoLOG Inject 3 Units into the skin 3 (three) times daily with meals.   insulin glargine 100 UNIT/ML injection Commonly known as:  LANTUS Inject 0.08 mLs (8 Units total) into the skin at bedtime. What changed:  how much to take   loratadine 10 MG tablet Commonly known as:  CLARITIN Take 1 tablet (10 mg total) by mouth daily. Start taking on:  11/28/2017   memantine 10 MG tablet Commonly known as:  NAMENDA Take 1 tablet (10 mg total) by mouth 2 (two) times daily.   mirtazapine 45 MG tablet Commonly known as:  REMERON Take 45 mg by mouth at bedtime.   potassium chloride 8 MEQ tablet Commonly known as:  KLOR-CON Take 1 tablet (8 mEq total) by mouth daily. What changed:  how much to take   ranitidine 150 MG capsule Commonly known as:  ZANTAC Take 150 mg by mouth 2 (two) times daily.   senna-docusate 8.6-50 MG tablet Commonly known as:  Senokot-S Take 1 tablet by mouth at bedtime as needed for mild constipation.   valsartan 160 MG tablet Commonly known as:  DIOVAN Take 160 mg by mouth daily.       Allergies  Allergen Reactions  . Codeine Phosphate Other (See Comments)    Unknown reaction  . Dexilant [Dexlansoprazole] Other (See Comments)    weakness  . Glimepiride Other (See Comments)    Unknown reaction  . Glyburide Other (See Comments)    Unknown reaction  . Levofloxacin Nausea Only    Made her weak and nauseated  . Shellfish Allergy Swelling    Lip swelling  . Tramadol Other (See Comments)    Unknown reaction    Contact information for follow-up providers    [facility] Follow up.   Why:  f/u as scheduled.           Contact information for after-discharge care    Destination    [facility] Preferred SNF .   Service:  Skilled Nursing Contact information: [place] [phone]                   The results of significant diagnostics from this hospitalization (including imaging, microbiology, ancillary and laboratory) are listed below for reference.    Significant Diagnostic Studies: Dg Chest 2 View  Result Date: 11/22/2017 CLINICAL DATA:  Chest pain EXAM: CHEST - 2 VIEW COMPARISON:  11/18/2017 FINDINGS: Lungs are essentially clear. No frank interstitial edema. No pleural effusion or pneumothorax. The heart is top-normal in size. Mild eventration of the right hemidiaphragm. Lateral view is nondiagnostic due to the patient's arms. IMPRESSION: No evidence of acute cardiopulmonary disease. Electronically Signed   By: Julian Hy M.D.   On: 11/22/2017 18:23   Ct Head Wo Contrast  Result Date: 11/22/2017 CLINICAL DATA:  Found down at home.  Vomiting.  Confusion. EXAM: CT HEAD WITHOUT CONTRAST TECHNIQUE: Contiguous axial images were obtained from the base of the skull through the vertex without intravenous contrast. COMPARISON:  CT head 11/15/2017.  MRI brain 11/17/2017. FINDINGS: Brain: There is no evidence of acute intracranial hemorrhage, mass lesion, brain edema or extra-axial fluid  collection. There is stable mild generalized atrophy with mild chronic periventricular white matter disease. No hydrocephalus. There is no CT evidence of acute cortical infarction. Vascular: Intracranial vascular calcifications. No hyperdense vessel identified. Skull: Negative for fracture or focal lesion. Sinuses/Orbits: There is mild mucosal thickening throughout the left paranasal sinuses. The mastoid air cells and middle ears are clear. Probable cerumen in the left external auditory canal. No orbital abnormalities are seen. Other: None. IMPRESSION: No acute intracranial findings. Mild mucosal thickening throughout the left paranasal sinuses. Electronically Signed   By: Richardean Sale M.D.   On: 11/22/2017 16:30   Ct Head Wo Contrast  Result Date: 11/15/2017 CLINICAL DATA:  Altered level of consciousness EXAM: CT HEAD WITHOUT CONTRAST TECHNIQUE: Contiguous axial images were obtained from the base of the skull through the vertex without intravenous contrast. COMPARISON:  11/01/2015 FINDINGS: Brain: Age related volume loss. No acute intracranial abnormality. Specifically, no hemorrhage, hydrocephalus, mass lesion, acute infarction, or significant intracranial injury. Vascular: No hyperdense vessel or unexpected calcification. Skull: No acute calvarial abnormality. Sinuses/Orbits: Visualized paranasal sinuses and mastoids clear. Orbital soft tissues unremarkable. Other: None IMPRESSION: No acute intracranial abnormality. Electronically Signed   By: Rolm Baptise M.D.   On: 11/15/2017 20:56   Mr Brain Wo Contrast  Result Date: 11/17/2017 CLINICAL DATA:  Dizziness, confusion, and generalized weakness. EXAM: MRI HEAD WITHOUT CONTRAST TECHNIQUE: Multiplanar, multiecho pulse sequences of the brain and surrounding structures were obtained without intravenous contrast. COMPARISON:  Head CT 11/15/2017 FINDINGS: Brain: There is no evidence of acute infarct, mass, midline shift, or extra-axial fluid collection.  Chronic microhemorrhages are noted in the thalami, cerebellum, and scattered in both cerebral hemispheres, possibly reflecting chronic hypertension. Chronic lacunar infarcts are noted in the basal ganglia, thalami, cerebral white matter, pons, and left cerebellum. There is mild-to-moderate cerebral atrophy. Vascular: Major intracranial vascular flow voids are preserved. Skull and upper cervical spine: Unremarkable bone marrow signal. Sinuses/Orbits: Unremarkable orbits. Paranasal sinuses and mastoid air cells are clear. Other: None. IMPRESSION: 1. No acute intracranial abnormality. 2. 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 Electronically Signed  By: Logan Bores M.D.   On: 11/17/2017 12:35   Mr Lumbar Spine Wo Contrast  Result Date: 11/19/2017 CLINICAL DATA:  Back pain.  Generalized weakness. EXAM: MRI LUMBAR SPINE WITHOUT CONTRAST TECHNIQUE: Multiplanar, multisequence MR imaging of the lumbar spine was performed. No intravenous contrast was administered. COMPARISON:  Lumbar spine radiograph 01/22/2016 Lumbar spine MRI 01/02/2012 FINDINGS: Segmentation: Normal. The lowest disc space is considered to be L5-S1. Alignment:  Left convex scoliosis apex at L3. Vertebrae: No acute compression fracture, discitis-osteomyelitis of focal marrow lesion. Conus medullaris and cauda equina: The conus medullaris terminates at the L2-3 level. The cauda equina and conus medullaris are both normal. Paraspinal and other soft tissues: The visualized aorta, IVC and iliac vessels are normal. The visualized retroperitoneal organs and paraspinal soft tissues are normal. Disc levels: T10-T12 is visualized in the sagittal plane only, with no disc herniation or stenosis. T12-L1: Minimal disc bulge without stenosis. L1-L2: Right eccentric small disc bulge. No spinal canal stenosis. Unchanged mild right foraminal stenosis. L2-L3: Right eccentric disc bulge and osteophyte. No spinal canal stenosis.  Moderate right neural foraminal stenosis, unchanged. L3-L4: Medium-sized disc bulge with moderate facet hypertrophy. Severe right and moderate left neural foraminal stenosis, progressed from prior MRI. Mild spinal canal stenosis has slightly worsened. L4-L5: Moderate right and severe left facet hypertrophy. Small disc bulge. Severe left and mild right foraminal stenosis. There is mild spinal canal stenosis with left lateral recess narrowing, unchanged. The left neural foraminal stenosis has progressed. L5-S1: Preserved disc height. Severe facet arthrosis. Moderate left foraminal stenosis with small left foraminal protrusion. This is unchanged. The visualized sacrum is normal. IMPRESSION: 1. No acute fracture or discitis-osteomyelitis of the lumbar spine. 2. Severe right, moderate left L3-4 neural foraminal stenosis, progressed since 01/02/2012. 3. Severe left L4-5 neural foraminal stenosis, also progressed. 4. Mild L3-4 and L4-5 spinal canal stenosis. 5. Left convex scoliosis with apex at L3. Electronically Signed   By: Ulyses Jarred M.D.   On: 11/19/2017 22:43   Dg Chest Port 1 View  Result Date: 11/18/2017 CLINICAL DATA:  Follow-up exam EXAM: PORTABLE CHEST 1 VIEW COMPARISON:  11/16/2017 FINDINGS: Cardiomegaly with vascular congestion. Interstitial prominence could reflect early interstitial edema. Elevation of the right hemidiaphragm, stable. No confluent opacities or visible effusions. IMPRESSION: Cardiomegaly with vascular congestion and suspected early interstitial edema. Stable elevation of the right hemidiaphragm. Electronically Signed   By: Rolm Baptise M.D.   On: 11/18/2017 09:20   Dg Chest Port 1 View  Result Date: 11/16/2017 CLINICAL DATA:  Altered mental status EXAM: PORTABLE CHEST 1 VIEW COMPARISON:  07/19/2017 FINDINGS: Low volume chest which accentuates chronic interstitial coarsening. Normal heart size for technique. Negative aortic and hilar contours. Postoperative left breast. IMPRESSION:  Low volume chest without acute finding. Electronically Signed   By: Monte Fantasia M.D.   On: 11/16/2017 17:08    Microbiology: Recent Results (from the past 240 hour(s))  Culture, blood (Routine X 2) w Reflex to ID Panel     Status: None   Collection Time: 11/17/17  3:40 PM  Result Value Ref Range Status   Specimen Description BLOOD RIGHT HAND  Final   Special Requests   Final    BOTTLES DRAWN AEROBIC ONLY Blood Culture adequate volume   Culture   Final    NO GROWTH 5 DAYS Performed at [facility], [address], [place], [place] [address]    Report Status 11/22/2017 FINAL  Final  Culture, blood (Routine X 2) w  Reflex to ID Panel     Status: None   Collection Time: 11/17/17  3:47 PM  Result Value Ref Range Status   Specimen Description BLOOD RIGHT HAND  Final   Special Requests   Final    BOTTLES DRAWN AEROBIC ONLY Blood Culture adequate volume   Culture   Final    NO GROWTH 5 DAYS Performed at [facility], [address], [place], [place] [address]    Report Status 11/22/2017 FINAL  Final  Blood Culture (routine x 2)     Status: None (Preliminary result)   Collection Time: 11/22/17 11:40 PM  Result Value Ref Range Status   Specimen Description BLOOD RIGHT FOREARM  Final   Special Requests   Final    BOTTLES DRAWN AEROBIC AND ANAEROBIC Blood Culture results may not be optimal due to an inadequate volume of blood received in culture bottles   Culture   Final    NO GROWTH 4 DAYS Performed at [facility], [place] [address] [place], [place] [address]    Report Status PENDING  Incomplete  Blood Culture (routine x 2)     Status: None (Preliminary result)   Collection Time: 11/22/17 11:55 PM  Result Value Ref Range Status   Specimen Description BLOOD LEFT HAND  Final   Special Requests   Final    BOTTLES DRAWN AEROBIC AND ANAEROBIC Blood Culture results may not be optimal due to an inadequate volume of blood received in culture bottles   Culture   Final     NO GROWTH 4 DAYS Performed at [facility], [place] [address] [place], [place] [address]    Report Status PENDING  Incomplete  Urine culture     Status: None   Collection Time: 11/23/17 12:11 AM  Result Value Ref Range Status   Specimen Description URINE, RANDOM  Final   Special Requests NONE  Final   Culture   Final    NO GROWTH Performed at [facility], [address], [place],  [address]    Report Status 11/24/2017 FINAL  Final     Labs: Basic Metabolic Panel: Recent Labs  Lab 11/23/17 0304 11/24/17 0635 11/25/17 0509 11/26/17 0538 11/27/17 0442  NA 141 142 141 141 142  K 3.7 3.4* 3.8 4.2 3.9  CL 108 110 107 109 108  CO2 21* 22 22 21* 22  GLUCOSE 162* 128* 176* 154* 124*  BUN 16 15 11 10 14   CREATININE 0.85 1.04* 0.97 1.00 1.21*  CALCIUM 9.4 8.8* 9.3 9.4 9.4   Liver Function Tests: Recent Labs  Lab 11/22/17 1716  AST 34  ALT 27  ALKPHOS 73  BILITOT 0.6  PROT 6.7  ALBUMIN 3.0*   No results for input(s): LIPASE, AMYLASE in the last 168 hours. No results for input(s): AMMONIA in the last 168 hours. CBC: Recent Labs  Lab 11/22/17 1716 11/23/17 0304 11/24/17 4481 11/25/17 0509 11/26/17 0538 11/27/17 0442  WBC 15.2* 12.3* 9.1 10.2 9.1 9.0  NEUTROABS 11.7* 8.4*  --   --   --   --   HGB 10.4* 10.7* 9.2* 10.1* 9.3* 9.4*  HCT 34.1* 34.5* 29.4* 32.2* 31.2* 30.8*  MCV 96.1 96.1 94.5 93.1 96.9 95.4  PLT 248 240 230 262 221 292   Cardiac Enzymes: Recent Labs  Lab 11/22/17 1717 11/25/17 1703  CKTOTAL 784* 514*  TROPONINI <0.03  --    BNP: BNP (last 3 results) No results for input(s): BNP  in the last 8760 hours.  ProBNP (last 3 results) No results for input(s): PROBNP in the last 8760 hours.  CBG: Recent Labs  Lab 11/26/17 1210 11/26/17 1712 11/26/17 2034 11/27/17 0758 11/27/17 1216  GLUCAP 178* 113* 168* 135* 169*       Signed:  Irine Seal MD.  [facility] 11/27/2017, 2:40 PM

## 2017-11-27 NOTE — Progress Notes (Signed)
Pt is discharged to [facility] via [facility].  Discharge instructions and prescriptions given.  Report given to receiving nurse Jacquiline Doe.

## 2017-11-27 NOTE — Social Work (Signed)
Clinical Social Worker facilitated patient discharge including contacting patient family and facility to confirm patient discharge plans.  Clinical information faxed to facility and family agreeable with plan.  CSW arranged ambulance transport via [facility] to [facility] at 4pm.  RN to call [phone] with report  prior to discharge.  Clinical Social Worker will sign off for now as social work intervention is no longer needed. Please consult [place] again if new need arises.  Alexander Mt, [place] Social Worker [phone]

## 2017-11-27 NOTE — Care Management Important Message (Signed)
Important Message  Patient Details  Name: Leesa Leifheit MRN: [id number] Date of Birth: 1940/12/03   Medicare Important Message Given:  Yes    Angeliyah Kirkey 11/27/2017, 1:51 PM

## 2017-11-27 NOTE — Social Work (Addendum)
CSW spoke with pt daughter Theophilus Kinds, she will fill out paperwork for pt at [facility] at 11:30am. CSW will facilitate discharge when summary and other information available.   10:22pm- Pt has an appointment scheduled with [facility] on August 14th at 11:30am.   MD aware pt set up at [facility]   Alexander Mt, [place] Work [phone]

## 2017-11-28 DIAGNOSIS — E119 Type 2 diabetes mellitus without complications: Secondary | ICD-10-CM | POA: Diagnosis not present

## 2017-11-28 DIAGNOSIS — F209 Schizophrenia, unspecified: Secondary | ICD-10-CM | POA: Diagnosis not present

## 2017-11-28 DIAGNOSIS — A419 Sepsis, unspecified organism: Secondary | ICD-10-CM | POA: Diagnosis not present

## 2017-11-28 DIAGNOSIS — M549 Dorsalgia, unspecified: Secondary | ICD-10-CM | POA: Diagnosis not present

## 2017-11-28 DIAGNOSIS — N390 Urinary tract infection, site not specified: Secondary | ICD-10-CM | POA: Diagnosis not present

## 2017-11-28 DIAGNOSIS — N12 Tubulo-interstitial nephritis, not specified as acute or chronic: Secondary | ICD-10-CM | POA: Diagnosis not present

## 2017-11-28 DIAGNOSIS — I10 Essential (primary) hypertension: Secondary | ICD-10-CM | POA: Diagnosis not present

## 2017-11-28 DIAGNOSIS — I509 Heart failure, unspecified: Secondary | ICD-10-CM | POA: Diagnosis not present

## 2017-11-28 LAB — CULTURE, BLOOD (ROUTINE X 2)
Culture: NO GROWTH
Culture: NO GROWTH

## 2017-12-03 DIAGNOSIS — L89152 Pressure ulcer of sacral region, stage 2: Secondary | ICD-10-CM | POA: Diagnosis not present

## 2017-12-03 DIAGNOSIS — H6122 Impacted cerumen, left ear: Secondary | ICD-10-CM | POA: Diagnosis not present

## 2017-12-08 DIAGNOSIS — N3000 Acute cystitis without hematuria: Secondary | ICD-10-CM | POA: Diagnosis not present

## 2017-12-08 DIAGNOSIS — E119 Type 2 diabetes mellitus without complications: Secondary | ICD-10-CM | POA: Diagnosis not present

## 2017-12-08 DIAGNOSIS — I10 Essential (primary) hypertension: Secondary | ICD-10-CM | POA: Diagnosis not present

## 2017-12-08 DIAGNOSIS — I5032 Chronic diastolic (congestive) heart failure: Secondary | ICD-10-CM | POA: Diagnosis not present

## 2017-12-09 ENCOUNTER — Other Ambulatory Visit: Payer: Self-pay | Admitting: *Deleted

## 2017-12-09 DIAGNOSIS — I10 Essential (primary) hypertension: Secondary | ICD-10-CM | POA: Diagnosis not present

## 2017-12-09 DIAGNOSIS — I5032 Chronic diastolic (congestive) heart failure: Secondary | ICD-10-CM | POA: Diagnosis not present

## 2017-12-09 DIAGNOSIS — E119 Type 2 diabetes mellitus without complications: Secondary | ICD-10-CM | POA: Diagnosis not present

## 2017-12-09 DIAGNOSIS — K219 Gastro-esophageal reflux disease without esophagitis: Secondary | ICD-10-CM | POA: Diagnosis not present

## 2017-12-09 NOTE — Patient Outreach (Signed)
[facility]) Care Management  12/09/2017  [name] [birth year] [id number]   Onsite visit to [facility]. Met with patient and her daughter, [name].  Patient sitting in w/c, neatly groomed and dressed. Patient reports she hopes to go home soon. She reports she gets her medications from [facility] in [place]. She has an aide 5 days a week for a couple of hours a day, she thinks Medicaid is providing this.  She reports that the aide helps with meals, housekeeping and personal care. Sometimes she provides transportation. Patient reports she has supportive family, she has 4 daughters and 1 son.  She does live alone and plans to return home alone with support of family and aide.  Daughter reports she is hoping to speak with SW today to see about patient discharging home soon.   RNCM reviewed [facility] care management program.  Daughter asking if it was more aide type services. RNCM discussed that that it was care management and that if patient needed more aide hours she may be able to get through Medicaid. Reviewed the services available through [facility]. Daughter [name] accepted the information and will review. RNCM left contact information for them to call with questions.  Also, discussed that [facility] would be onsite tomorrow and maybe able to visit to review discharge plan with patient and daughter, also, referred them to speak with facility SW regarding discharge plans. [name] reports her other sister will be there in the morning, Doreen.   Plan to collaborate with [facility] nurse.  Royetta Crochet. Laymond Purser, RN, BSN, [place] [phone]) Business Cell  [phone]) Toll Free Office  Unable to meet with facility SW during onsite visit.

## 2017-12-10 DIAGNOSIS — K219 Gastro-esophageal reflux disease without esophagitis: Secondary | ICD-10-CM | POA: Diagnosis not present

## 2017-12-10 DIAGNOSIS — L89152 Pressure ulcer of sacral region, stage 2: Secondary | ICD-10-CM | POA: Diagnosis not present

## 2017-12-10 DIAGNOSIS — E119 Type 2 diabetes mellitus without complications: Secondary | ICD-10-CM | POA: Diagnosis not present

## 2017-12-10 DIAGNOSIS — I10 Essential (primary) hypertension: Secondary | ICD-10-CM | POA: Diagnosis not present

## 2017-12-10 DIAGNOSIS — I5032 Chronic diastolic (congestive) heart failure: Secondary | ICD-10-CM | POA: Diagnosis not present

## 2017-12-15 DIAGNOSIS — I10 Essential (primary) hypertension: Secondary | ICD-10-CM | POA: Diagnosis not present

## 2017-12-15 DIAGNOSIS — I5032 Chronic diastolic (congestive) heart failure: Secondary | ICD-10-CM | POA: Diagnosis not present

## 2017-12-15 DIAGNOSIS — F200 Paranoid schizophrenia: Secondary | ICD-10-CM | POA: Diagnosis not present

## 2017-12-15 DIAGNOSIS — E119 Type 2 diabetes mellitus without complications: Secondary | ICD-10-CM | POA: Diagnosis not present

## 2017-12-17 DIAGNOSIS — I2510 Atherosclerotic heart disease of native coronary artery without angina pectoris: Secondary | ICD-10-CM | POA: Diagnosis not present

## 2017-12-17 DIAGNOSIS — F200 Paranoid schizophrenia: Secondary | ICD-10-CM | POA: Diagnosis not present

## 2017-12-17 DIAGNOSIS — M6281 Muscle weakness (generalized): Secondary | ICD-10-CM | POA: Diagnosis not present

## 2017-12-17 DIAGNOSIS — R2689 Other abnormalities of gait and mobility: Secondary | ICD-10-CM | POA: Diagnosis not present

## 2017-12-17 DIAGNOSIS — Z79891 Long term (current) use of opiate analgesic: Secondary | ICD-10-CM | POA: Diagnosis not present

## 2017-12-17 DIAGNOSIS — Z794 Long term (current) use of insulin: Secondary | ICD-10-CM | POA: Diagnosis not present

## 2017-12-17 DIAGNOSIS — I110 Hypertensive heart disease with heart failure: Secondary | ICD-10-CM | POA: Diagnosis not present

## 2017-12-17 DIAGNOSIS — F0390 Unspecified dementia without behavioral disturbance: Secondary | ICD-10-CM | POA: Diagnosis not present

## 2017-12-17 DIAGNOSIS — E119 Type 2 diabetes mellitus without complications: Secondary | ICD-10-CM | POA: Diagnosis not present

## 2017-12-17 DIAGNOSIS — I5032 Chronic diastolic (congestive) heart failure: Secondary | ICD-10-CM | POA: Diagnosis not present

## 2017-12-17 DIAGNOSIS — D509 Iron deficiency anemia, unspecified: Secondary | ICD-10-CM | POA: Diagnosis not present

## 2017-12-17 DIAGNOSIS — R41841 Cognitive communication deficit: Secondary | ICD-10-CM | POA: Diagnosis not present

## 2017-12-18 DIAGNOSIS — Z79899 Other long term (current) drug therapy: Secondary | ICD-10-CM | POA: Diagnosis not present

## 2017-12-18 DIAGNOSIS — F250 Schizoaffective disorder, bipolar type: Secondary | ICD-10-CM | POA: Diagnosis not present

## 2017-12-18 DIAGNOSIS — F209 Schizophrenia, unspecified: Secondary | ICD-10-CM | POA: Diagnosis not present

## 2017-12-19 DIAGNOSIS — F200 Paranoid schizophrenia: Secondary | ICD-10-CM | POA: Diagnosis not present

## 2017-12-19 DIAGNOSIS — I2510 Atherosclerotic heart disease of native coronary artery without angina pectoris: Secondary | ICD-10-CM | POA: Diagnosis not present

## 2017-12-19 DIAGNOSIS — I110 Hypertensive heart disease with heart failure: Secondary | ICD-10-CM | POA: Diagnosis not present

## 2017-12-19 DIAGNOSIS — F0390 Unspecified dementia without behavioral disturbance: Secondary | ICD-10-CM | POA: Diagnosis not present

## 2017-12-19 DIAGNOSIS — E119 Type 2 diabetes mellitus without complications: Secondary | ICD-10-CM | POA: Diagnosis not present

## 2017-12-19 DIAGNOSIS — I5032 Chronic diastolic (congestive) heart failure: Secondary | ICD-10-CM | POA: Diagnosis not present

## 2017-12-23 DIAGNOSIS — I5032 Chronic diastolic (congestive) heart failure: Secondary | ICD-10-CM | POA: Diagnosis not present

## 2017-12-23 DIAGNOSIS — F0390 Unspecified dementia without behavioral disturbance: Secondary | ICD-10-CM | POA: Diagnosis not present

## 2017-12-23 DIAGNOSIS — F200 Paranoid schizophrenia: Secondary | ICD-10-CM | POA: Diagnosis not present

## 2017-12-23 DIAGNOSIS — E119 Type 2 diabetes mellitus without complications: Secondary | ICD-10-CM | POA: Diagnosis not present

## 2017-12-23 DIAGNOSIS — I110 Hypertensive heart disease with heart failure: Secondary | ICD-10-CM | POA: Diagnosis not present

## 2017-12-23 DIAGNOSIS — I2510 Atherosclerotic heart disease of native coronary artery without angina pectoris: Secondary | ICD-10-CM | POA: Diagnosis not present

## 2017-12-24 DIAGNOSIS — F0390 Unspecified dementia without behavioral disturbance: Secondary | ICD-10-CM | POA: Diagnosis not present

## 2017-12-24 DIAGNOSIS — I2510 Atherosclerotic heart disease of native coronary artery without angina pectoris: Secondary | ICD-10-CM | POA: Diagnosis not present

## 2017-12-24 DIAGNOSIS — I5032 Chronic diastolic (congestive) heart failure: Secondary | ICD-10-CM | POA: Diagnosis not present

## 2017-12-24 DIAGNOSIS — E119 Type 2 diabetes mellitus without complications: Secondary | ICD-10-CM | POA: Diagnosis not present

## 2017-12-24 DIAGNOSIS — F200 Paranoid schizophrenia: Secondary | ICD-10-CM | POA: Diagnosis not present

## 2017-12-24 DIAGNOSIS — I110 Hypertensive heart disease with heart failure: Secondary | ICD-10-CM | POA: Diagnosis not present

## 2017-12-25 DIAGNOSIS — M5136 Other intervertebral disc degeneration, lumbar region: Secondary | ICD-10-CM | POA: Diagnosis not present

## 2017-12-25 DIAGNOSIS — E1165 Type 2 diabetes mellitus with hyperglycemia: Secondary | ICD-10-CM | POA: Diagnosis not present

## 2017-12-25 DIAGNOSIS — I10 Essential (primary) hypertension: Secondary | ICD-10-CM | POA: Diagnosis not present

## 2017-12-25 DIAGNOSIS — K210 Gastro-esophageal reflux disease with esophagitis: Secondary | ICD-10-CM | POA: Diagnosis not present

## 2017-12-25 DIAGNOSIS — D649 Anemia, unspecified: Secondary | ICD-10-CM | POA: Diagnosis not present

## 2017-12-25 DIAGNOSIS — E1140 Type 2 diabetes mellitus with diabetic neuropathy, unspecified: Secondary | ICD-10-CM | POA: Diagnosis not present

## 2017-12-25 DIAGNOSIS — F0390 Unspecified dementia without behavioral disturbance: Secondary | ICD-10-CM | POA: Diagnosis not present

## 2017-12-25 DIAGNOSIS — F209 Schizophrenia, unspecified: Secondary | ICD-10-CM | POA: Diagnosis not present

## 2017-12-26 DIAGNOSIS — I5032 Chronic diastolic (congestive) heart failure: Secondary | ICD-10-CM | POA: Diagnosis not present

## 2017-12-26 DIAGNOSIS — F200 Paranoid schizophrenia: Secondary | ICD-10-CM | POA: Diagnosis not present

## 2017-12-26 DIAGNOSIS — F0390 Unspecified dementia without behavioral disturbance: Secondary | ICD-10-CM | POA: Diagnosis not present

## 2017-12-26 DIAGNOSIS — E119 Type 2 diabetes mellitus without complications: Secondary | ICD-10-CM | POA: Diagnosis not present

## 2017-12-26 DIAGNOSIS — I2510 Atherosclerotic heart disease of native coronary artery without angina pectoris: Secondary | ICD-10-CM | POA: Diagnosis not present

## 2017-12-26 DIAGNOSIS — I110 Hypertensive heart disease with heart failure: Secondary | ICD-10-CM | POA: Diagnosis not present

## 2017-12-29 DIAGNOSIS — I2510 Atherosclerotic heart disease of native coronary artery without angina pectoris: Secondary | ICD-10-CM | POA: Diagnosis not present

## 2017-12-29 DIAGNOSIS — F200 Paranoid schizophrenia: Secondary | ICD-10-CM | POA: Diagnosis not present

## 2017-12-29 DIAGNOSIS — I5032 Chronic diastolic (congestive) heart failure: Secondary | ICD-10-CM | POA: Diagnosis not present

## 2017-12-29 DIAGNOSIS — F0390 Unspecified dementia without behavioral disturbance: Secondary | ICD-10-CM | POA: Diagnosis not present

## 2017-12-29 DIAGNOSIS — I110 Hypertensive heart disease with heart failure: Secondary | ICD-10-CM | POA: Diagnosis not present

## 2017-12-29 DIAGNOSIS — E119 Type 2 diabetes mellitus without complications: Secondary | ICD-10-CM | POA: Diagnosis not present

## 2017-12-31 DIAGNOSIS — E119 Type 2 diabetes mellitus without complications: Secondary | ICD-10-CM | POA: Diagnosis not present

## 2017-12-31 DIAGNOSIS — F0390 Unspecified dementia without behavioral disturbance: Secondary | ICD-10-CM | POA: Diagnosis not present

## 2017-12-31 DIAGNOSIS — F200 Paranoid schizophrenia: Secondary | ICD-10-CM | POA: Diagnosis not present

## 2017-12-31 DIAGNOSIS — I110 Hypertensive heart disease with heart failure: Secondary | ICD-10-CM | POA: Diagnosis not present

## 2017-12-31 DIAGNOSIS — I2510 Atherosclerotic heart disease of native coronary artery without angina pectoris: Secondary | ICD-10-CM | POA: Diagnosis not present

## 2017-12-31 DIAGNOSIS — I5032 Chronic diastolic (congestive) heart failure: Secondary | ICD-10-CM | POA: Diagnosis not present

## 2018-01-02 DIAGNOSIS — I2510 Atherosclerotic heart disease of native coronary artery without angina pectoris: Secondary | ICD-10-CM | POA: Diagnosis not present

## 2018-01-02 DIAGNOSIS — I110 Hypertensive heart disease with heart failure: Secondary | ICD-10-CM | POA: Diagnosis not present

## 2018-01-02 DIAGNOSIS — E119 Type 2 diabetes mellitus without complications: Secondary | ICD-10-CM | POA: Diagnosis not present

## 2018-01-02 DIAGNOSIS — F0390 Unspecified dementia without behavioral disturbance: Secondary | ICD-10-CM | POA: Diagnosis not present

## 2018-01-02 DIAGNOSIS — I5032 Chronic diastolic (congestive) heart failure: Secondary | ICD-10-CM | POA: Diagnosis not present

## 2018-01-02 DIAGNOSIS — F200 Paranoid schizophrenia: Secondary | ICD-10-CM | POA: Diagnosis not present

## 2018-01-05 DIAGNOSIS — I110 Hypertensive heart disease with heart failure: Secondary | ICD-10-CM | POA: Diagnosis not present

## 2018-01-05 DIAGNOSIS — H9313 Tinnitus, bilateral: Secondary | ICD-10-CM | POA: Diagnosis not present

## 2018-01-05 DIAGNOSIS — H903 Sensorineural hearing loss, bilateral: Secondary | ICD-10-CM | POA: Diagnosis not present

## 2018-01-05 DIAGNOSIS — I5032 Chronic diastolic (congestive) heart failure: Secondary | ICD-10-CM | POA: Diagnosis not present

## 2018-01-05 DIAGNOSIS — E119 Type 2 diabetes mellitus without complications: Secondary | ICD-10-CM | POA: Diagnosis not present

## 2018-01-05 DIAGNOSIS — I2510 Atherosclerotic heart disease of native coronary artery without angina pectoris: Secondary | ICD-10-CM | POA: Diagnosis not present

## 2018-01-05 DIAGNOSIS — F200 Paranoid schizophrenia: Secondary | ICD-10-CM | POA: Diagnosis not present

## 2018-01-05 DIAGNOSIS — F0390 Unspecified dementia without behavioral disturbance: Secondary | ICD-10-CM | POA: Diagnosis not present

## 2018-01-06 DIAGNOSIS — I5032 Chronic diastolic (congestive) heart failure: Secondary | ICD-10-CM | POA: Diagnosis not present

## 2018-01-06 DIAGNOSIS — F0390 Unspecified dementia without behavioral disturbance: Secondary | ICD-10-CM | POA: Diagnosis not present

## 2018-01-06 DIAGNOSIS — F200 Paranoid schizophrenia: Secondary | ICD-10-CM | POA: Diagnosis not present

## 2018-01-06 DIAGNOSIS — E119 Type 2 diabetes mellitus without complications: Secondary | ICD-10-CM | POA: Diagnosis not present

## 2018-01-06 DIAGNOSIS — I2510 Atherosclerotic heart disease of native coronary artery without angina pectoris: Secondary | ICD-10-CM | POA: Diagnosis not present

## 2018-01-06 DIAGNOSIS — I110 Hypertensive heart disease with heart failure: Secondary | ICD-10-CM | POA: Diagnosis not present

## 2018-01-12 DIAGNOSIS — I5032 Chronic diastolic (congestive) heart failure: Secondary | ICD-10-CM | POA: Diagnosis not present

## 2018-01-12 DIAGNOSIS — I110 Hypertensive heart disease with heart failure: Secondary | ICD-10-CM | POA: Diagnosis not present

## 2018-01-12 DIAGNOSIS — F0390 Unspecified dementia without behavioral disturbance: Secondary | ICD-10-CM | POA: Diagnosis not present

## 2018-01-12 DIAGNOSIS — F200 Paranoid schizophrenia: Secondary | ICD-10-CM | POA: Diagnosis not present

## 2018-01-12 DIAGNOSIS — E119 Type 2 diabetes mellitus without complications: Secondary | ICD-10-CM | POA: Diagnosis not present

## 2018-01-12 DIAGNOSIS — I2510 Atherosclerotic heart disease of native coronary artery without angina pectoris: Secondary | ICD-10-CM | POA: Diagnosis not present

## 2018-01-16 DIAGNOSIS — I2510 Atherosclerotic heart disease of native coronary artery without angina pectoris: Secondary | ICD-10-CM | POA: Diagnosis not present

## 2018-01-16 DIAGNOSIS — I110 Hypertensive heart disease with heart failure: Secondary | ICD-10-CM | POA: Diagnosis not present

## 2018-01-16 DIAGNOSIS — E119 Type 2 diabetes mellitus without complications: Secondary | ICD-10-CM | POA: Diagnosis not present

## 2018-01-16 DIAGNOSIS — I5032 Chronic diastolic (congestive) heart failure: Secondary | ICD-10-CM | POA: Diagnosis not present

## 2018-01-16 DIAGNOSIS — F200 Paranoid schizophrenia: Secondary | ICD-10-CM | POA: Diagnosis not present

## 2018-01-16 DIAGNOSIS — F0390 Unspecified dementia without behavioral disturbance: Secondary | ICD-10-CM | POA: Diagnosis not present

## 2018-01-22 DIAGNOSIS — Z79899 Other long term (current) drug therapy: Secondary | ICD-10-CM | POA: Diagnosis not present

## 2018-01-22 DIAGNOSIS — F209 Schizophrenia, unspecified: Secondary | ICD-10-CM | POA: Diagnosis not present

## 2018-01-22 DIAGNOSIS — F250 Schizoaffective disorder, bipolar type: Secondary | ICD-10-CM | POA: Diagnosis not present

## 2018-01-29 DIAGNOSIS — I2510 Atherosclerotic heart disease of native coronary artery without angina pectoris: Secondary | ICD-10-CM | POA: Diagnosis not present

## 2018-01-29 DIAGNOSIS — F200 Paranoid schizophrenia: Secondary | ICD-10-CM | POA: Diagnosis not present

## 2018-01-29 DIAGNOSIS — I110 Hypertensive heart disease with heart failure: Secondary | ICD-10-CM | POA: Diagnosis not present

## 2018-01-29 DIAGNOSIS — E119 Type 2 diabetes mellitus without complications: Secondary | ICD-10-CM | POA: Diagnosis not present

## 2018-01-29 DIAGNOSIS — F0390 Unspecified dementia without behavioral disturbance: Secondary | ICD-10-CM | POA: Diagnosis not present

## 2018-01-29 DIAGNOSIS — I5032 Chronic diastolic (congestive) heart failure: Secondary | ICD-10-CM | POA: Diagnosis not present

## 2018-02-02 DIAGNOSIS — Z79899 Other long term (current) drug therapy: Secondary | ICD-10-CM | POA: Diagnosis not present

## 2018-02-02 DIAGNOSIS — J310 Chronic rhinitis: Secondary | ICD-10-CM | POA: Diagnosis not present

## 2018-02-02 DIAGNOSIS — I10 Essential (primary) hypertension: Secondary | ICD-10-CM | POA: Diagnosis not present

## 2018-02-27 DIAGNOSIS — F209 Schizophrenia, unspecified: Secondary | ICD-10-CM | POA: Diagnosis not present

## 2018-02-27 DIAGNOSIS — Z79899 Other long term (current) drug therapy: Secondary | ICD-10-CM | POA: Diagnosis not present

## 2018-02-27 DIAGNOSIS — F250 Schizoaffective disorder, bipolar type: Secondary | ICD-10-CM | POA: Diagnosis not present

## 2018-03-30 DIAGNOSIS — Z79899 Other long term (current) drug therapy: Secondary | ICD-10-CM | POA: Diagnosis not present

## 2018-03-30 DIAGNOSIS — K210 Gastro-esophageal reflux disease with esophagitis: Secondary | ICD-10-CM | POA: Diagnosis not present

## 2018-03-30 DIAGNOSIS — I10 Essential (primary) hypertension: Secondary | ICD-10-CM | POA: Diagnosis not present

## 2018-03-30 DIAGNOSIS — E7800 Pure hypercholesterolemia, unspecified: Secondary | ICD-10-CM | POA: Diagnosis not present

## 2018-03-30 DIAGNOSIS — F0390 Unspecified dementia without behavioral disturbance: Secondary | ICD-10-CM | POA: Diagnosis not present

## 2018-03-30 DIAGNOSIS — M5136 Other intervertebral disc degeneration, lumbar region: Secondary | ICD-10-CM | POA: Diagnosis not present

## 2018-03-30 DIAGNOSIS — Z794 Long term (current) use of insulin: Secondary | ICD-10-CM | POA: Diagnosis not present

## 2018-03-30 DIAGNOSIS — E1165 Type 2 diabetes mellitus with hyperglycemia: Secondary | ICD-10-CM | POA: Diagnosis not present

## 2018-03-30 DIAGNOSIS — D649 Anemia, unspecified: Secondary | ICD-10-CM | POA: Diagnosis not present

## 2018-03-31 DIAGNOSIS — Z79899 Other long term (current) drug therapy: Secondary | ICD-10-CM | POA: Diagnosis not present

## 2018-06-05 ENCOUNTER — Emergency Department (HOSPITAL_COMMUNITY): Payer: Medicare Other

## 2018-06-05 ENCOUNTER — Encounter (HOSPITAL_COMMUNITY): Payer: Self-pay | Admitting: Emergency Medicine

## 2018-06-05 ENCOUNTER — Emergency Department (HOSPITAL_COMMUNITY)
Admission: EM | Admit: 2018-06-05 | Discharge: 2018-06-05 | Disposition: A | Discharge status: Home / Self Care | Payer: Medicare Other | Attending: Emergency Medicine | Admitting: Emergency Medicine

## 2018-06-05 DIAGNOSIS — Z96653 Presence of artificial knee joint, bilateral: Secondary | ICD-10-CM | POA: Insufficient documentation

## 2018-06-05 DIAGNOSIS — Z853 Personal history of malignant neoplasm of breast: Secondary | ICD-10-CM | POA: Insufficient documentation

## 2018-06-05 DIAGNOSIS — R1013 Epigastric pain: Secondary | ICD-10-CM

## 2018-06-05 DIAGNOSIS — Z794 Long term (current) use of insulin: Secondary | ICD-10-CM | POA: Diagnosis not present

## 2018-06-05 DIAGNOSIS — I110 Hypertensive heart disease with heart failure: Secondary | ICD-10-CM | POA: Diagnosis not present

## 2018-06-05 DIAGNOSIS — E119 Type 2 diabetes mellitus without complications: Secondary | ICD-10-CM | POA: Diagnosis not present

## 2018-06-05 DIAGNOSIS — Z955 Presence of coronary angioplasty implant and graft: Secondary | ICD-10-CM | POA: Insufficient documentation

## 2018-06-05 DIAGNOSIS — I2510 Atherosclerotic heart disease of native coronary artery without angina pectoris: Secondary | ICD-10-CM | POA: Diagnosis not present

## 2018-06-05 DIAGNOSIS — Z87891 Personal history of nicotine dependence: Secondary | ICD-10-CM | POA: Diagnosis not present

## 2018-06-05 DIAGNOSIS — I5032 Chronic diastolic (congestive) heart failure: Secondary | ICD-10-CM | POA: Diagnosis not present

## 2018-06-05 DIAGNOSIS — D72829 Elevated white blood cell count, unspecified: Secondary | ICD-10-CM | POA: Insufficient documentation

## 2018-06-05 DIAGNOSIS — F0390 Unspecified dementia without behavioral disturbance: Secondary | ICD-10-CM | POA: Insufficient documentation

## 2018-06-05 DIAGNOSIS — Z79899 Other long term (current) drug therapy: Secondary | ICD-10-CM | POA: Diagnosis not present

## 2018-06-05 LAB — URINALYSIS, ROUTINE W REFLEX MICROSCOPIC
Bilirubin Urine: NEGATIVE
GLUCOSE, UA: NEGATIVE mg/dL
Hgb urine dipstick: NEGATIVE
KETONES UR: NEGATIVE mg/dL
LEUKOCYTE UA: NEGATIVE
Nitrite: NEGATIVE
PROTEIN: NEGATIVE mg/dL
Specific Gravity, Urine: 1.006 (ref 1.005–1.030)
pH: 8 (ref 5.0–8.0)

## 2018-06-05 LAB — COMPREHENSIVE METABOLIC PANEL
ALK PHOS: 97 U/L (ref 38–126)
ALT: 13 U/L (ref 0–44)
AST: 18 U/L (ref 15–41)
Albumin: 3.8 g/dL (ref 3.5–5.0)
Anion gap: 9 (ref 5–15)
BUN: 12 mg/dL (ref 8–23)
CALCIUM: 9.5 mg/dL (ref 8.9–10.3)
CO2: 29 mmol/L (ref 22–32)
CREATININE: 0.87 mg/dL (ref 0.44–1.00)
Chloride: 104 mmol/L (ref 98–111)
Glucose, Bld: 94 mg/dL (ref 70–99)
Potassium: 3.3 mmol/L — ABNORMAL LOW (ref 3.5–5.1)
Sodium: 142 mmol/L (ref 135–145)
TOTAL PROTEIN: 7.5 g/dL (ref 6.5–8.1)
Total Bilirubin: 0.4 mg/dL (ref 0.3–1.2)

## 2018-06-05 LAB — AMMONIA: Ammonia: 30 umol/L (ref 9–35)

## 2018-06-05 LAB — CBC
HCT: 38.6 % (ref 36.0–46.0)
Hemoglobin: 11.6 g/dL — ABNORMAL LOW (ref 12.0–15.0)
MCH: 28.3 pg (ref 26.0–34.0)
MCHC: 30.1 g/dL (ref 30.0–36.0)
MCV: 94.1 fL (ref 80.0–100.0)
NRBC: 0 % (ref 0.0–0.2)
PLATELETS: 389 10*3/uL (ref 150–400)
RBC: 4.1 MIL/uL (ref 3.87–5.11)
RDW: 16 % — AB (ref 11.5–15.5)
WBC: 12.6 10*3/uL — AB (ref 4.0–10.5)

## 2018-06-05 LAB — LIPASE, BLOOD: Lipase: 27 U/L (ref 11–51)

## 2018-06-05 MED ORDER — SODIUM CHLORIDE (PF) 0.9 % IJ SOLN
INTRAMUSCULAR | Status: AC
  Filled 2018-06-05: qty 50

## 2018-06-05 MED ORDER — IOPAMIDOL (ISOVUE-300) INJECTION 61%
100.0000 mL | Freq: Once | INTRAVENOUS | Status: AC | PRN
  Administered 2018-06-05: 100 mL via INTRAVENOUS

## 2018-06-05 MED ORDER — DICYCLOMINE HCL 20 MG PO TABS: 20.0000 mg | ORAL_TABLET | Freq: Two times a day (BID) | ORAL | 0 refills | Status: DC

## 2018-06-05 MED ORDER — IOPAMIDOL (ISOVUE-300) INJECTION 61%
INTRAVENOUS | Status: AC
  Filled 2018-06-05: qty 100

## 2018-06-05 NOTE — ED Notes (Signed)
Patient transported to CT 

## 2018-06-05 NOTE — ED Notes (Signed)
Family at bedside. 

## 2018-06-05 NOTE — ED Provider Notes (Signed)
[facility] DEPT Provider Note   CSN: [id number] Arrival date & time: 06/05/18  1234     History   Chief Complaint Chief Complaint  Patient presents with  . elevated WBC  . Abdominal Pain    HPI [name] is a 78 y.o. female who was sent into the ED for evaluation of abdominal pain by her PCP. SHe has a pmh of CAD, PVD, breast cancer, esophageal dysmotility, IBS, mild demintia, anxiety , NIDDM, and paranoid schizophrenia. THe patient states that she has had several weeks of abdominal pain.  Intermittent diarrhea.  She has a history of irritable bowel syndrome.  She denies nausea vomiting.  She denies constipation.  Patient saw her PCP today and had elevated white blood cell count was sent to the ER.  She denies urinary symptoms, fevers, chills.  HPI  Past Medical History:  Diagnosis Date  . Allergic rhinitis   . Anemia, iron deficiency   . Anxiety   . Arthritis    back  . CAD (coronary artery disease)    CAD stent placed 2005  . Cancer ([place])    Left breast invasive ductal carcinoma  . Dementia ([place])   . Esophageal motility disorder   . GERD (gastroesophageal reflux disease)   . HTN (hypertension)   . Hyperlipidemia   . IBS (irritable bowel syndrome)   . Peripheral neuropathy   . Pneumonia 12-09-2014   adm with CAP requiring mechanical ventilation x 10d  . PVD (peripheral vascular disease) ([facility])    Left subclavian 70% stenosis  . Renal insufficiency   . Schizophrenia ([place])    paranoid schizophrenic  . Syncope    EP study 1996 w/atrial tachycardia  . Type II or unspecified type diabetes mellitus without mention of complication, not stated as uncontrolled     Patient Active Problem List   Diagnosis Date Noted  . Metabolic encephalopathy   . Sepsis ([place])   . Chronic diastolic CHF (congestive heart failure) ([place]) 11/22/2017  . Breast cancer of upper-outer quadrant of left female breast ([place]) 08/29/2015  . SIRS (systemic  inflammatory response syndrome) ([place]) 12/23/2014  . Extrapyramidal disorder   . DYSPHAGIA, PHARYNGOESOPHAGEAL PHASE 02/07/2009  . TOBACCO USE, QUIT 02/03/2009  . VERTIGO 06/22/2008  . Chronic back pain 10/26/2007  . Acute cystitis without hematuria 06/30/2007  . HYPERLIPIDEMIA 06/02/2007  . Dementia ([place]) 06/02/2007  . Paranoid schizophrenia ([place]) 06/02/2007  . PERIPHERAL VASCULAR DISEASE 06/02/2007  . ALLERGIC RHINITIS 06/02/2007  . IBS 06/02/2007  . FATIGUE 06/02/2007  . PERIPHERAL EDEMA 06/02/2007  . Type II diabetes mellitus, well controlled ([place]) 09/04/2006  . ANEMIA-IRON DEFICIENCY 09/04/2006  . ANXIETY 09/04/2006  . PERIPHERAL NEUROPATHY 09/04/2006  . Essential hypertension 09/04/2006  . CORONARY ARTERY DISEASE 09/04/2006  . GERD 09/04/2006    Past Surgical History:  Procedure Laterality Date  . ABDOMINAL HYSTERECTOMY    . BREAST BIOPSY     Negative  . ESOPHAGOGASTRODUODENOSCOPY N/A 11/02/2015   Procedure: ESOPHAGOGASTRODUODENOSCOPY (EGD);  Surgeon: Laurence Spates, MD;  Location: [facility];  Service: Endoscopy;  Laterality: N/A;  . JOINT REPLACEMENT Bilateral   . KNEE ARTHROPLASTY     Right   . PTCA  1995  . RADIOACTIVE SEED GUIDED PARTIAL MASTECTOMY WITH AXILLARY SENTINEL LYMPH NODE BIOPSY Left 08/17/2015   Procedure: RADIOACTIVE SEED GUIDED LUMPECTOMY WITH AXILLARY SENTINEL LYMPH NODE BIOPSY;  Surgeon: Donnie Mesa, MD;  Location: [facility];  Service: General;  Laterality: Left;  . RE-EXCISION OF BREAST LUMPECTOMY Left  09/07/2015   Procedure: RE-EXCISION OF LEFT BREAST LUMPECTOMY;  Surgeon: Donnie Mesa, MD;  Location: [facility];  Service: General;  Laterality: Left;     OB History   No obstetric history on file.      Home Medications    Prior to Admission medications   Medication Sig Start Date End Date Taking? Authorizing Provider  ACETAMINOPHEN-PAMABROM PO Take 1 tablet by mouth 2 (two) times daily. "Back Aid"     [provider]  amLODipine (NORVASC) 10 MG tablet Take 10 mg by mouth daily.     [provider]  atorvastatin (LIPITOR) 40 MG tablet Take 40 mg by mouth daily.    [provider]  carvedilol (COREG) 12.5 MG tablet Take 1 tablet (12.5 mg total) by mouth 2 (two) times daily with a meal. 11/21/17   Danford, Suann Larry, MD  Chlorphen-Phenyleph-ASA (ALKA-SELTZER PLUS COLD PO) Take 1 tablet by mouth 2 (two) times daily.    [provider]  cloNIDine (CATAPRES - DOSED IN MG/24 HR) 0.2 mg/24hr patch Place 1 patch (0.2 mg total) onto the skin every Monday at 6 PM. 11/24/17   Danford, Suann Larry, MD  cloZAPine (CLOZARIL) 100 MG tablet Take 2 tablets (200 mg total) by mouth at bedtime. 11/27/17   Eugenie Filler, MD  donepezil (ARICEPT) 10 MG tablet Take 10 mg by mouth at bedtime.    [provider]  esomeprazole (NEXIUM) 40 MG capsule Take 40 mg by mouth 2 (two) times daily.    [provider]  feeding supplement, GLUCERNA SHAKE, (GLUCERNA SHAKE) LIQD Take 237 mLs by mouth 3 (three) times daily between meals. Patient not taking: Reported on 11/16/2017 11/09/15   Murlean Iba, MD  fluticasone (FLONASE) 50 MCG/ACT nasal spray Place 2 sprays into both nostrils daily. 11/28/17   Eugenie Filler, MD  furosemide (LASIX) 40 MG tablet Take 40 mg by mouth daily.     [provider]  gabapentin (NEURONTIN) 100 MG capsule Take 100 mg by mouth at bedtime as needed (pain).    [provider]  haloperidol (HALDOL) 5 MG tablet Take 5-10 mg by mouth See admin instructions. Take one tablet (5 mg) by mouth every morning and two tablets (10 mg) at bedtime as tolerated    [provider]  HYDROcodone-acetaminophen (NORCO/VICODIN) 5-325 MG tablet Take 1-2 tablets by mouth every 6 (six) hours as needed for moderate pain. 11/27/17   Eugenie Filler, MD  insulin aspart (NOVOLOG) 100 UNIT/ML injection Inject 3 Units into the skin 3 (three)  times daily with meals. 11/27/17   Eugenie Filler, MD  insulin glargine (LANTUS) 100 UNIT/ML injection Inject 0.08 mLs (8 Units total) into the skin at bedtime. 11/27/17   Eugenie Filler, MD  loratadine (CLARITIN) 10 MG tablet Take 1 tablet (10 mg total) by mouth daily. 11/28/17   Eugenie Filler, MD  memantine (NAMENDA) 10 MG tablet Take 1 tablet (10 mg total) by mouth 2 (two) times daily. 10/09/12   Norins, Heinz Knuckles, MD  mirtazapine (REMERON) 45 MG tablet Take 45 mg by mouth at bedtime.     [provider]  potassium chloride (KLOR-CON) 8 MEQ tablet Take 1 tablet (8 mEq total) by mouth daily. Patient taking differently: Take 16 mEq by mouth daily.  04/28/12   Norins, Heinz Knuckles, MD  ranitidine (ZANTAC) 150 MG capsule Take 150 mg by mouth 2 (two) times daily.    [provider]  senna-docusate (  SENOKOT-S) 8.6-50 MG tablet Take 1 tablet by mouth at bedtime as needed for mild constipation. 11/27/17   Eugenie Filler, MD  valsartan (DIOVAN) 160 MG tablet Take 160 mg by mouth daily.    [provider]    Family History Family History  Problem Relation Age of Onset  . Hypertension Other   . Diabetes Other   . Schizophrenia Neg Hx   . Coronary artery disease Neg Hx   . Colon cancer Neg Hx   . Breast cancer Neg Hx     Social History Social History   Tobacco Use  . Smoking status: Former Research scientist (life sciences)  . Smokeless tobacco: Never Used  Substance Use Topics  . Alcohol use: No  . Drug use: No     Allergies   Codeine phosphate; Dexilant [dexlansoprazole]; Glimepiride; Glyburide; Levofloxacin; Shellfish allergy; and Tramadol   Review of Systems Review of Systems Ten systems reviewed and are negative for acute change, except as noted in the HPI.    Physical Exam Updated Vital Signs BP (!) 170/67 (BP Location: Left Arm)   Pulse 65   Temp 98.4 F (36.9 C) (Oral)   Resp 18   SpO2 97%   Physical Exam Physical Exam  Nursing note and vitals  reviewed. Constitutional: She is oriented to person, place, and time. She appears well-developed and well-nourished. No distress.  HENT:  Head: Normocephalic and atraumatic.  Eyes: Conjunctivae normal and EOM are normal. Pupils are equal, round, and reactive to light. No scleral icterus.  Neck: Normal range of motion.  Cardiovascular: Normal rate, regular rhythm and normal heart sounds.  Exam reveals no gallop and no friction rub.   No murmur heard. Pulmonary/Chest: Effort normal and breath sounds normal. No respiratory distress.  Abdominal: Soft. Bowel sounds are normal. She exhibits no distension and no mass. There is no tenderness. There is no guarding.  Neurological: She is alert and oriented to person, place, and time.  Skin: Skin is warm and dry. She is not diaphoretic.     ED Treatments / Results  Labs (all labs ordered are listed, but only abnormal results are displayed) Labs Reviewed  CBC - Abnormal; Notable for the following components:      Result Value   WBC 12.6 (*)    Hemoglobin 11.6 (*)    RDW 16.0 (*)    All other components within normal limits  LIPASE, BLOOD  COMPREHENSIVE METABOLIC PANEL  URINALYSIS, ROUTINE W REFLEX MICROSCOPIC    EKG None  Radiology No results found.  Procedures Procedures (including critical care time)  Medications Ordered in ED Medications - No data to display   Initial Impression / Assessment and Plan / ED Course  I have reviewed the triage vital signs and the nursing notes.  Pertinent labs & imaging results that were available during my care of the patient were reviewed by me and considered in my medical decision making (see chart for details).     78 year old female with abdominal pain and loose stools.  He is gathered from the patient and her daughter who is at bedside.  Also reviewed the patient's medical records.  Seen and shared visit with Dr. Maryan Rued. The causes for generalized abdominal pain include but are not  limited to gastritis, gastroenteritis, IBS, pancreatitis, peritonitis, intestinal ischemia, constipation, UTI, intestinal obstruction, perforated viscus, eg, peptic ulcer, appendix, gallbladder, diverticulitis, physical or sexual abuse, abdominal abscess, ruptured spleen, AAA, diabetic ketoacidosis, hypercalcemia, uremia, parasitic or other infection, eg: tapeworms, flukes, Giargia, Cryto, Yersinia,  adrenal insufficiency,lead poisoning, iron toxicity, polyarteritis nodosa, Henoch-Schnlein purpura, porphyria, eg, acute intermittent porphyria, familial Mediterranean fever. Given her history of IBS I have strong suspicion that her symptoms are related to her chronic diagnosis.  She does have a slightly elevated white blood cell count however she is afebrile and hemodynamically stable.  The remainder of her labs are without abnormality.  Her CT scan of the abdomen and pelvis with contrast is also without abnormality.  I discussed the findings with the patient and her family.  Patient appears appropriate for discharge at this time.   Final Clinical Impressions(s) / ED Diagnoses   Final diagnoses:  Epigastric pain    ED Discharge Orders    None       Margarita Mail, PA-C 06/05/18 1724    Blanchie Dessert, MD 06/06/18 1530

## 2018-06-05 NOTE — ED Triage Notes (Signed)
Pt c/o abd pains for over week, with some diarrhea. Denies n/v/ or urinary problems. Pt had blood work done 2 weeks ago had WBC over 15. Pt hasnt been on any antibiotics.

## 2018-06-05 NOTE — ED Notes (Signed)
Pure wick has been placed. Pt and family member have been educated. Suction set to 45mmHg. 

## 2018-06-05 NOTE — Discharge Instructions (Signed)

## 2018-10-09 DIAGNOSIS — Z79899 Other long term (current) drug therapy: Secondary | ICD-10-CM | POA: Diagnosis not present

## 2018-10-09 DIAGNOSIS — F250 Schizoaffective disorder, bipolar type: Secondary | ICD-10-CM | POA: Diagnosis not present

## 2020-04-27 DIAGNOSIS — Z79899 Other long term (current) drug therapy: Secondary | ICD-10-CM | POA: Diagnosis not present

## 2020-06-01 DIAGNOSIS — Z79899 Other long term (current) drug therapy: Secondary | ICD-10-CM | POA: Diagnosis not present

## 2020-06-29 DIAGNOSIS — Z79899 Other long term (current) drug therapy: Secondary | ICD-10-CM | POA: Diagnosis not present

## 2020-07-11 DIAGNOSIS — D72829 Elevated white blood cell count, unspecified: Secondary | ICD-10-CM | POA: Diagnosis not present

## 2020-07-11 DIAGNOSIS — E7800 Pure hypercholesterolemia, unspecified: Secondary | ICD-10-CM | POA: Diagnosis not present

## 2020-07-11 DIAGNOSIS — E1169 Type 2 diabetes mellitus with other specified complication: Secondary | ICD-10-CM | POA: Diagnosis not present

## 2020-07-11 DIAGNOSIS — Z79899 Other long term (current) drug therapy: Secondary | ICD-10-CM | POA: Diagnosis not present

## 2020-07-11 DIAGNOSIS — D649 Anemia, unspecified: Secondary | ICD-10-CM | POA: Diagnosis not present

## 2020-08-21 DIAGNOSIS — Z79899 Other long term (current) drug therapy: Secondary | ICD-10-CM | POA: Diagnosis not present

## 2020-09-21 DIAGNOSIS — Z79899 Other long term (current) drug therapy: Secondary | ICD-10-CM | POA: Diagnosis not present

## 2020-10-26 ENCOUNTER — Telehealth: Payer: Self-pay

## 2020-10-26 NOTE — Telephone Encounter (Signed)
Spoke with patient's daughter [name] and scheduled an in-person Palliative Consult for 11/22/20 @ 12:30 PM.   COVID screening was negative. No pets in home. Patient lives alone.  Consent obtained; updated Outlook/Netsmart/Team List and Epic.   Family is aware they may be receiving a call from NP the day before or day of to confirm appointment.

## 2020-11-06 DIAGNOSIS — Z79899 Other long term (current) drug therapy: Secondary | ICD-10-CM | POA: Diagnosis not present

## 2020-11-22 ENCOUNTER — Other Ambulatory Visit: Payer: Self-pay

## 2020-11-22 ENCOUNTER — Other Ambulatory Visit: Payer: Medicare Other | Admitting: Hospice

## 2020-11-22 DIAGNOSIS — Z515 Encounter for palliative care: Secondary | ICD-10-CM

## 2020-11-22 DIAGNOSIS — I5032 Chronic diastolic (congestive) heart failure: Secondary | ICD-10-CM

## 2020-11-22 DIAGNOSIS — E119 Type 2 diabetes mellitus without complications: Secondary | ICD-10-CM

## 2020-11-22 DIAGNOSIS — F0390 Unspecified dementia without behavioral disturbance: Secondary | ICD-10-CM

## 2020-11-22 NOTE — Progress Notes (Signed)
[facility] Consult Note Telephone: [phone]  Fax: [phone]  PATIENT NAME: [name] [address] [place] [address] [phone] (home)  DOB: 06/01/1940 MRN: [id number]  PRIMARY CARE PROVIDER:    [name], MD,  [address] [place] [address] [phone]  REFERRING PROVIDER:   [name], MD [address] [place],  [place] [address] [phone]  RESPONSIBLE PARTY:   [place]     Name Relation Home Work Mobile   [facility] Daughter [phone]   [facility] Daughter [phone]  [phone]   [name] Daughter   [phone]   [name] Daughter   [phone]        I met face to face with patient and family at home . Palliative Care was asked to follow this patient by consultation request of  Koirala, Dibas, MD to address advance care planning, complex medical decision making and goals of care clarification. [name] and [name] and [name] are home with patient during visit. This is the initial visit.    ASSESSMENT AND / RECOMMENDATIONS:   Advance Care Planning: Our advance care planning conversation included a discussion about:    The value and importance of advance care planning  Difference between Hospice and Palliative care Exploration of goals of care in the event of a sudden injury or illness  Identification and preparation of a healthcare agent  Review and updating or creation of an  advance directive document .  CODE STATUS: Patient is a FULL code.  Plan is to [place].  Goals of Care: Goals include to maximize quality of life and symptom management Visit consisted of counseling and education dealing with the complex and emotionally intense issues of symptom management and palliative care in the setting of serious and potentially life-threatening illness. Patient has  a supportive close-knit family.Palliative care team will continue to support patient, patient's family, and medical team.    I spent  46 minutes providing this initial consultation. More than 50% of the time in this consultation was spent on counseling patient and coordinating communication. --------------------------------------------------------------------------------------------------------------------------------------  Symptom Management/Plan: Dementia: Progressive memory loss/confusion in line with dementia disease trajectory.  Continue Aricept and Namenda.  Discussed the use of appropriate level puzzles and word search and encourage participation in activities of choice.  Safety precautions in place. 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 Patient is on Clozapine, Haldol.  Tolerating well. Type 2 DM: A1c 7.7 in March 2022. Repeat A1c, Routine CBC BMP HTN: Continue Carvedilol.  Reduced salt intake. Weakness/Fall: Fall last month with bruises which have resolved.  OT/PT is recommended as patient will benefit from strengthening and gait training.   Follow up PCP January 12 2021.  Follow up: Palliative care will continue to follow for complex medical decision making, advance care planning, and clarification of goals. Return 6 weeks or prn.Encouraged to call provider sooner with any concerns.   Family /Caregiver/Community Supports: Patient lives at home alone. Her daughters visit often and help with ADLs. [name] has applied for CAP to get more service hours for patient. She currently gets 80 hours of nursing aides per month.  HOSPICE ELIGIBILITY/DIAGNOSIS: TBD  Chief Complaint: Initial Palliative care visit  HISTORY OF PRESENT ILLNESS:  [name] is a 80 y.o. year old female  with multiple medical conditions including Dementia ongoing for about 5 years, , worsening in the last month with worsening memory loss  which impairs her social interactions  and independence. Memory is worse as the day; daughter thinks cueing and quiet low stimulating environment are helpful.  History of type 2 diabetes mellitus, congestive heart failure, paranoid schizophrenia. History obtained from review of EMR, discussion with primary team, caregiver, family and/or [name]  Review and summarization of Epic records shows history from other than patient. Rest of 10 point ROS asked and negative.    Review of lab tests/diagnostics   Results for PALYN, SCRIMA (MRN [id number]) as of 11/22/2020 11:51  Ref. Range 06/05/2018 14:11  WBC Latest Ref Range: 4.0 - 10.5 K/uL 12.6 (H)  RBC Latest Ref Range: 3.87 - 5.11 MIL/uL 4.10  Hemoglobin Latest Ref Range: 12.0 - 15.0 g/dL 11.6 (L)  HCT Latest Ref Range: 36.0 - 46.0 % 38.6  MCV Latest Ref Range: 80.0 - 100.0 fL 94.1  MCH Latest Ref Range: 26.0 - 34.0 pg 28.3  MCHC Latest Ref Range: 30.0 - 36.0 g/dL 30.1  RDW Latest Ref Range: 11.5 - 15.5 % 16.0 (H)  Platelets Latest Ref Range: 150 - 400 K/uL 389  Results for DARNEISHA, WINDHORST (MRN [id number]) as of 11/22/2020 11:51  Ref. Range 06/05/2018 14:11  Sodium Latest Ref Range: 135 - 145 mmol/L 142  Potassium Latest Ref Range: 3.5 - 5.1 mmol/L 3.3 (L)  Chloride Latest Ref Range: 98 - 111 mmol/L 104  CO2 Latest Ref Range: 22 - 32 mmol/L 29  Glucose Latest Ref Range: 70 - 99 mg/dL 94  BUN Latest Ref Range: 8 - 23 mg/dL 12  Creatinine Latest Ref Range: 0.44 - 1.00 mg/dL 0.87  Calcium Latest Ref Range: 8.9 - 10.3 mg/dL 9.5  Anion gap Latest Ref Range: 5 - 15  9  Alkaline Phosphatase Latest Ref Range: 38 - 126 U/L 97  Albumin Latest Ref Range: 3.5 - 5.0 g/dL 3.8  Lipase Latest Ref Range: 11 - 51 U/L 27  AST Latest Ref Range: 15 - 41 U/L 18  ALT Latest Ref Range: 0 - 44 U/L 13  Total Protein Latest Ref Range: 6.5 - 8.1 g/dL 7.5  Total Bilirubin Latest Ref Range: 0.3 - 1.2 mg/dL 0.4  GFR, Est Non African American Latest Ref Range: >60 mL/min >60  GFR, Est African American Latest  Ref Range: >60 mL/min >60    ROS General: NAD EYES: denies vision changes,  ENMT: denies dysphagia Cardiovascular: denies chest pain/discomfort Pulmonary: denies cough, denies SOB Abdomen: endorses good appetite, denies constipation/diarrhea GU: denies dysuria, urinary frequency MSK:  endorses weakness Skin: denies rashes or wounds Neurological: denies pain, denies insomnia Psych: Endorses positive mood Heme/lymph/immuno: denies bruises, abnormal bleeding  Physical Exam: Constitutional: NAD General: Well groomed, cooperative EYES: anicteric sclera, lids intact, no discharge  ENMT: Moist mucous membrane CV: S1 S2, RRR, no LE edema Pulmonary: LCTA, no increased work of breathing, no cough, Abdomen: active BS + 4 quadrants, soft and non tender GU: no suprapubic tenderness MSK: weakness, unsteady gait, uses a rolling walker  Skin: warm and dry, no rashes or wounds on visible skin Neuro:  weakness, otherwise non focal, memory loss/confusion Psych: Flat affect Hem/lymph/immuno: no widespread bruising   PAST MEDICAL HISTORY:  Active Ambulatory Problems    Diagnosis Date Noted   Type II diabetes mellitus, well controlled ([place]) 09/04/2006   HYPERLIPIDEMIA 06/02/2007   ANEMIA-IRON DEFICIENCY 09/04/2006   Dementia ([place]) 06/02/2007   Paranoid schizophrenia ([place]) 06/02/2007   ANXIETY 09/04/2006   PERIPHERAL NEUROPATHY 09/04/2006   Essential hypertension 09/04/2006   CORONARY ARTERY  DISEASE 09/04/2006   PERIPHERAL VASCULAR DISEASE 06/02/2007   ALLERGIC RHINITIS 06/02/2007   GERD 09/04/2006   IBS 06/02/2007   Acute cystitis without hematuria 06/30/2007   Chronic back pain 10/26/2007   VERTIGO 06/22/2008   FATIGUE 06/02/2007   PERIPHERAL EDEMA 06/02/2007   DYSPHAGIA, PHARYNGOESOPHAGEAL PHASE 02/07/2009   TOBACCO USE, QUIT 02/03/2009   Extrapyramidal disorder    SIRS (systemic inflammatory response syndrome) ([place]) 12/23/2014   Breast cancer of upper-outer quadrant of left  female breast ([place]) 08/29/2015   Chronic diastolic CHF (congestive heart failure) ([place]) 47/12/6281   Metabolic encephalopathy    Sepsis ([place])    Resolved Ambulatory Problems    Diagnosis Date Noted   THRUSH 02/03/2009   DEHYDRATION (VOLUME DEPLETION) 02/03/2009   RENAL FAILURE, ACUTE 02/07/2009   RENAL INSUFFICIENCY 02/05/2009   UTI 06/02/2007   Nausea alone 02/03/2009   HX, PERSONAL, SCHIZOPHRENIA 09/04/2006   Community acquired pneumonia 12/09/2014   CAP (community acquired pneumonia) 12/09/2014   Acute respiratory failure with hypoxia ([place]) 12/10/2014   ARDS (adult respiratory distress syndrome) ([place]) 12/10/2014   Blood poisoning    Acute respiratory failure with hypoxemia (HCC)    Hypernatremia 12/23/2014   Hypokalemia 12/23/2014   Anemia 12/23/2014   Upper GI bleed 11/01/2015   GI bleed 11/01/2015   Septic shock ([place]) 11/02/2015   Acute blood loss anemia 11/02/2015   Acute kidney injury ([place]) 11/02/2015   Altered mental status    Past Medical History:  Diagnosis Date   Allergic rhinitis    Anemia, iron deficiency    Anxiety    Arthritis    CAD (coronary artery disease)    Cancer (HCC)    Esophageal motility disorder    GERD (gastroesophageal reflux disease)    HTN (hypertension)    Hyperlipidemia    IBS (irritable bowel syndrome)    Peripheral neuropathy    Pneumonia 12-09-2014   PVD (peripheral vascular disease) (HCC)    Renal insufficiency    Schizophrenia (HCC)    Syncope    Type II or unspecified type diabetes mellitus without mention of complication, not stated as uncontrolled     SOCIAL HX:  Social History   Tobacco Use   Smoking status: Former   Smokeless tobacco: Never  Substance Use Topics   Alcohol use: No     FAMILY HX:  Family History  Problem Relation Age of Onset   Hypertension Other    Diabetes Other    Schizophrenia Neg Hx    Coronary artery disease Neg Hx    Colon cancer Neg Hx    Breast cancer Neg Hx       ALLERGIES:   Allergies  Allergen Reactions   Codeine Phosphate Other (See Comments)    Unknown reaction   Dexilant [Dexlansoprazole] Other (See Comments)    weakness   Glimepiride Other (See Comments)    Unknown reaction   Glyburide Other (See Comments)    Unknown reaction   Levofloxacin Nausea Only    Made her weak and nauseated   Shellfish Allergy Swelling    Lip swelling   Tramadol Other (See Comments)    Unknown reaction      PERTINENT MEDICATIONS:  Outpatient Encounter Medications as of 11/22/2020  Medication Sig   ACETAMINOPHEN-PAMABROM PO Take 1 tablet by mouth 2 (two) times daily. "Back Aid"   amLODipine (NORVASC) 10 MG tablet Take 10 mg by mouth daily.    atorvastatin (LIPITOR) 40 MG tablet Take 40 mg by mouth daily.  carvedilol (COREG) 12.5 MG tablet Take 1 tablet (12.5 mg total) by mouth 2 (two) times daily with a meal.   Chlorphen-Phenyleph-ASA (ALKA-SELTZER PLUS COLD PO) Take 1 tablet by mouth 2 (two) times daily.   cloNIDine (CATAPRES - DOSED IN MG/24 HR) 0.2 mg/24hr patch Place 1 patch (0.2 mg total) onto the skin every Monday at 6 PM.   cloZAPine (CLOZARIL) 100 MG tablet Take 2 tablets (200 mg total) by mouth at bedtime.   dicyclomine (BENTYL) 20 MG tablet Take 1 tablet (20 mg total) by mouth 2 (two) times daily.   donepezil (ARICEPT) 10 MG tablet Take 10 mg by mouth at bedtime.   esomeprazole (NEXIUM) 40 MG capsule Take 40 mg by mouth 2 (two) times daily.   feeding supplement, GLUCERNA SHAKE, (GLUCERNA SHAKE) LIQD Take 237 mLs by mouth 3 (three) times daily between meals. (Patient not taking: Reported on 11/16/2017)   fluticasone (FLONASE) 50 MCG/ACT nasal spray Place 2 sprays into both nostrils daily.   furosemide (LASIX) 40 MG tablet Take 40 mg by mouth daily.    gabapentin (NEURONTIN) 100 MG capsule Take 100 mg by mouth at bedtime as needed (pain).   haloperidol (HALDOL) 5 MG tablet Take 5-10 mg by mouth See admin instructions. Take one tablet (5 mg) by mouth every morning  and two tablets (10 mg) at bedtime as tolerated   HYDROcodone-acetaminophen (NORCO/VICODIN) 5-325 MG tablet Take 1-2 tablets by mouth every 6 (six) hours as needed for moderate pain.   insulin aspart (NOVOLOG) 100 UNIT/ML injection Inject 3 Units into the skin 3 (three) times daily with meals.   insulin glargine (LANTUS) 100 UNIT/ML injection Inject 0.08 mLs (8 Units total) into the skin at bedtime.   loratadine (CLARITIN) 10 MG tablet Take 1 tablet (10 mg total) by mouth daily.   memantine (NAMENDA) 10 MG tablet Take 1 tablet (10 mg total) by mouth 2 (two) times daily.   mirtazapine (REMERON) 45 MG tablet Take 45 mg by mouth at bedtime.    potassium chloride (KLOR-CON) 8 MEQ tablet Take 1 tablet (8 mEq total) by mouth daily. (Patient taking differently: Take 16 mEq by mouth daily. )   ranitidine (ZANTAC) 150 MG capsule Take 150 mg by mouth 2 (two) times daily.   senna-docusate (SENOKOT-S) 8.6-50 MG tablet Take 1 tablet by mouth at bedtime as needed for mild constipation.   valsartan (DIOVAN) 160 MG tablet Take 160 mg by mouth daily.   No facility-administered encounter medications on file as of 11/22/2020.   Thank you for the opportunity to participate in the care of Ms. Biller.  The palliative care team will continue to follow. Please call our office at [phone] if we can be of additional assistance.   Note: Portions of this note were generated with Lobbyist. Dictation errors may occur despite best attempts at proofreading.  Teodoro Spray, NP

## 2020-12-12 DIAGNOSIS — Z79899 Other long term (current) drug therapy: Secondary | ICD-10-CM | POA: Diagnosis not present

## 2020-12-28 DIAGNOSIS — Z79899 Other long term (current) drug therapy: Secondary | ICD-10-CM | POA: Diagnosis not present

## 2020-12-28 DIAGNOSIS — D649 Anemia, unspecified: Secondary | ICD-10-CM | POA: Diagnosis not present

## 2020-12-28 DIAGNOSIS — G8929 Other chronic pain: Secondary | ICD-10-CM | POA: Diagnosis not present

## 2020-12-28 DIAGNOSIS — D72829 Elevated white blood cell count, unspecified: Secondary | ICD-10-CM | POA: Diagnosis not present

## 2020-12-28 DIAGNOSIS — M419 Scoliosis, unspecified: Secondary | ICD-10-CM | POA: Diagnosis not present

## 2020-12-28 DIAGNOSIS — E7800 Pure hypercholesterolemia, unspecified: Secondary | ICD-10-CM | POA: Diagnosis not present

## 2020-12-28 DIAGNOSIS — E1169 Type 2 diabetes mellitus with other specified complication: Secondary | ICD-10-CM | POA: Diagnosis not present

## 2020-12-28 DIAGNOSIS — Z0001 Encounter for general adult medical examination with abnormal findings: Secondary | ICD-10-CM | POA: Diagnosis not present

## 2020-12-28 DIAGNOSIS — I10 Essential (primary) hypertension: Secondary | ICD-10-CM | POA: Diagnosis not present

## 2020-12-28 DIAGNOSIS — G629 Polyneuropathy, unspecified: Secondary | ICD-10-CM | POA: Diagnosis not present

## 2020-12-29 ENCOUNTER — Ambulatory Visit
Admission: RE | Admit: 2020-12-29 | Discharge: 2020-12-29 | Disposition: A | Discharge status: Home / Self Care | Payer: Medicare Other | Source: Ambulatory Visit | Attending: Family Medicine | Admitting: Family Medicine

## 2020-12-29 ENCOUNTER — Other Ambulatory Visit: Payer: Self-pay | Admitting: Family Medicine

## 2020-12-29 DIAGNOSIS — I700 Atherosclerosis of aorta: Secondary | ICD-10-CM | POA: Diagnosis not present

## 2020-12-29 DIAGNOSIS — D72829 Elevated white blood cell count, unspecified: Secondary | ICD-10-CM

## 2020-12-29 DIAGNOSIS — I517 Cardiomegaly: Secondary | ICD-10-CM | POA: Diagnosis not present

## 2020-12-29 DIAGNOSIS — E1169 Type 2 diabetes mellitus with other specified complication: Secondary | ICD-10-CM | POA: Diagnosis not present

## 2020-12-29 DIAGNOSIS — R918 Other nonspecific abnormal finding of lung field: Secondary | ICD-10-CM | POA: Diagnosis not present

## 2021-01-11 ENCOUNTER — Other Ambulatory Visit: Payer: Self-pay

## 2021-01-11 ENCOUNTER — Ambulatory Visit
Admission: RE | Admit: 2021-01-11 | Discharge: 2021-01-11 | Disposition: A | Discharge status: Home / Self Care | Payer: Medicare Other | Source: Ambulatory Visit | Attending: Family Medicine | Admitting: Family Medicine

## 2021-01-11 ENCOUNTER — Other Ambulatory Visit: Payer: Self-pay | Admitting: Family Medicine

## 2021-01-11 DIAGNOSIS — J189 Pneumonia, unspecified organism: Secondary | ICD-10-CM

## 2021-01-11 DIAGNOSIS — R918 Other nonspecific abnormal finding of lung field: Secondary | ICD-10-CM | POA: Diagnosis not present

## 2021-01-12 DIAGNOSIS — Z79899 Other long term (current) drug therapy: Secondary | ICD-10-CM | POA: Diagnosis not present

## 2021-01-15 ENCOUNTER — Other Ambulatory Visit: Payer: Self-pay | Admitting: Family Medicine

## 2021-01-15 DIAGNOSIS — I10 Essential (primary) hypertension: Secondary | ICD-10-CM | POA: Diagnosis not present

## 2021-01-15 DIAGNOSIS — M5459 Other low back pain: Secondary | ICD-10-CM | POA: Diagnosis not present

## 2021-01-15 DIAGNOSIS — R9389 Abnormal findings on diagnostic imaging of other specified body structures: Secondary | ICD-10-CM

## 2021-01-15 DIAGNOSIS — J9859 Other diseases of mediastinum, not elsewhere classified: Secondary | ICD-10-CM

## 2021-01-15 DIAGNOSIS — M419 Scoliosis, unspecified: Secondary | ICD-10-CM | POA: Diagnosis not present

## 2021-01-31 ENCOUNTER — Ambulatory Visit
Admission: RE | Admit: 2021-01-31 | Discharge: 2021-01-31 | Disposition: A | Discharge status: Home / Self Care | Payer: Medicare Other | Source: Ambulatory Visit | Attending: Family Medicine | Admitting: Family Medicine

## 2021-01-31 DIAGNOSIS — J9859 Other diseases of mediastinum, not elsewhere classified: Secondary | ICD-10-CM

## 2021-01-31 DIAGNOSIS — R590 Localized enlarged lymph nodes: Secondary | ICD-10-CM | POA: Diagnosis not present

## 2021-01-31 DIAGNOSIS — R9389 Abnormal findings on diagnostic imaging of other specified body structures: Secondary | ICD-10-CM

## 2021-01-31 MED ORDER — IOPAMIDOL (ISOVUE-300) INJECTION 61%
75.0000 mL | Freq: Once | INTRAVENOUS | Status: AC | PRN
  Administered 2021-01-31: 75 mL via INTRAVENOUS

## 2021-02-01 DIAGNOSIS — L218 Other seborrheic dermatitis: Secondary | ICD-10-CM | POA: Diagnosis not present

## 2021-02-14 DIAGNOSIS — I10 Essential (primary) hypertension: Secondary | ICD-10-CM | POA: Diagnosis not present

## 2021-02-14 DIAGNOSIS — M5459 Other low back pain: Secondary | ICD-10-CM | POA: Diagnosis not present

## 2021-02-14 DIAGNOSIS — M419 Scoliosis, unspecified: Secondary | ICD-10-CM | POA: Diagnosis not present

## 2021-03-17 DIAGNOSIS — M5459 Other low back pain: Secondary | ICD-10-CM | POA: Diagnosis not present

## 2021-03-17 DIAGNOSIS — I10 Essential (primary) hypertension: Secondary | ICD-10-CM | POA: Diagnosis not present

## 2021-03-17 DIAGNOSIS — M419 Scoliosis, unspecified: Secondary | ICD-10-CM | POA: Diagnosis not present

## 2021-03-27 ENCOUNTER — Other Ambulatory Visit (HOSPITAL_COMMUNITY): Payer: Self-pay | Admitting: *Deleted

## 2021-03-27 DIAGNOSIS — R1310 Dysphagia, unspecified: Secondary | ICD-10-CM

## 2021-04-02 DIAGNOSIS — L309 Dermatitis, unspecified: Secondary | ICD-10-CM | POA: Diagnosis not present

## 2021-04-02 DIAGNOSIS — L218 Other seborrheic dermatitis: Secondary | ICD-10-CM | POA: Diagnosis not present

## 2021-04-02 DIAGNOSIS — Z79899 Other long term (current) drug therapy: Secondary | ICD-10-CM | POA: Diagnosis not present

## 2021-04-06 ENCOUNTER — Ambulatory Visit (HOSPITAL_COMMUNITY)
Admission: RE | Admit: 2021-04-06 | Discharge: 2021-04-06 | Disposition: A | Discharge status: Home / Self Care | Payer: Medicare Other | Source: Ambulatory Visit | Attending: Family Medicine | Admitting: Family Medicine

## 2021-04-06 ENCOUNTER — Other Ambulatory Visit: Payer: Self-pay

## 2021-04-06 DIAGNOSIS — R1310 Dysphagia, unspecified: Secondary | ICD-10-CM | POA: Insufficient documentation

## 2021-04-06 DIAGNOSIS — R1312 Dysphagia, oropharyngeal phase: Secondary | ICD-10-CM | POA: Insufficient documentation

## 2021-04-16 DIAGNOSIS — M5459 Other low back pain: Secondary | ICD-10-CM | POA: Diagnosis not present

## 2021-04-16 DIAGNOSIS — I10 Essential (primary) hypertension: Secondary | ICD-10-CM | POA: Diagnosis not present

## 2021-04-16 DIAGNOSIS — M419 Scoliosis, unspecified: Secondary | ICD-10-CM | POA: Diagnosis not present

## 2021-04-24 DIAGNOSIS — Z79899 Other long term (current) drug therapy: Secondary | ICD-10-CM | POA: Diagnosis not present

## 2021-05-14 ENCOUNTER — Other Ambulatory Visit: Payer: Self-pay | Admitting: Family Medicine

## 2021-05-14 DIAGNOSIS — R918 Other nonspecific abnormal finding of lung field: Secondary | ICD-10-CM

## 2021-05-23 DIAGNOSIS — M419 Scoliosis, unspecified: Secondary | ICD-10-CM | POA: Diagnosis not present

## 2021-05-23 DIAGNOSIS — I10 Essential (primary) hypertension: Secondary | ICD-10-CM | POA: Diagnosis not present

## 2021-05-23 DIAGNOSIS — M5459 Other low back pain: Secondary | ICD-10-CM | POA: Diagnosis not present

## 2021-05-28 DIAGNOSIS — Z79899 Other long term (current) drug therapy: Secondary | ICD-10-CM | POA: Diagnosis not present

## 2021-06-14 ENCOUNTER — Other Ambulatory Visit: Payer: Medicare Other

## 2021-06-28 DIAGNOSIS — K224 Dyskinesia of esophagus: Secondary | ICD-10-CM | POA: Diagnosis not present

## 2021-06-28 DIAGNOSIS — E1169 Type 2 diabetes mellitus with other specified complication: Secondary | ICD-10-CM | POA: Diagnosis not present

## 2021-06-28 DIAGNOSIS — Z79899 Other long term (current) drug therapy: Secondary | ICD-10-CM | POA: Diagnosis not present

## 2021-06-28 DIAGNOSIS — M419 Scoliosis, unspecified: Secondary | ICD-10-CM | POA: Diagnosis not present

## 2021-06-28 DIAGNOSIS — G629 Polyneuropathy, unspecified: Secondary | ICD-10-CM | POA: Diagnosis not present

## 2021-06-28 DIAGNOSIS — M5136 Other intervertebral disc degeneration, lumbar region: Secondary | ICD-10-CM | POA: Diagnosis not present

## 2021-06-28 DIAGNOSIS — E1165 Type 2 diabetes mellitus with hyperglycemia: Secondary | ICD-10-CM | POA: Diagnosis not present

## 2021-06-28 DIAGNOSIS — I700 Atherosclerosis of aorta: Secondary | ICD-10-CM | POA: Diagnosis not present

## 2021-06-28 DIAGNOSIS — E7800 Pure hypercholesterolemia, unspecified: Secondary | ICD-10-CM | POA: Diagnosis not present

## 2021-06-28 DIAGNOSIS — I10 Essential (primary) hypertension: Secondary | ICD-10-CM | POA: Diagnosis not present

## 2021-07-03 ENCOUNTER — Other Ambulatory Visit: Payer: Self-pay

## 2021-07-03 ENCOUNTER — Other Ambulatory Visit: Payer: Medicare Other | Admitting: Hospice

## 2021-07-03 DIAGNOSIS — Z515 Encounter for palliative care: Secondary | ICD-10-CM

## 2021-07-03 DIAGNOSIS — F200 Paranoid schizophrenia: Secondary | ICD-10-CM

## 2021-07-03 DIAGNOSIS — E119 Type 2 diabetes mellitus without complications: Secondary | ICD-10-CM | POA: Diagnosis not present

## 2021-07-03 DIAGNOSIS — F03918 Unspecified dementia, unspecified severity, with other behavioral disturbance: Secondary | ICD-10-CM

## 2021-07-03 NOTE — Progress Notes (Signed)
? ? ?[facility] ?Community Palliative Care Consult Note ?Telephone: [phone]  ?Fax: [phone] ? ?PATIENT NAME: [name] ?[place] [address] ?[phone] (home)  ?DOB: [birth year] ?MRN: [id number] ? ?PRIMARY CARE PROVIDER:    ?[name], MD,  ?[address] ?[place] [address] ?[phone] ? ?REFERRING PROVIDER:   ?[name], MD ?[address] ?[address] ?[place],  [place] [address] ?[phone] ? ?RESPONSIBLE PARTY:   [name] ?Daughter - Santiago Glad is the caregiver   [phone]  ?Patient [phone] ?Contact Information   ? ? Name Relation Home Work Mobile  ? Ruiz,Karen Daughter [phone]  [phone]  ? Hanton,Valarie Daughter [phone]  ? Dawkins,Doreen Daughter [phone]  [phone]  ? Lesane,Tammie Daughter   [phone]  ? ?  ? ?TELEHEALTH VISIT STATEMENT ?Due to the COVID-19 crisis, this visit was done via telemedicine from my office and it was initiated and consent by this patient and or family.  ?I connected with patient OR PROXY by a telephone/video  and verified that I am speaking with the correct person. I discussed the limitations of evaluation and management by telemedicine. The patient expressed understanding and agreed to proceed. ?Palliative Care was asked to follow this patient to address advance care planning, complex medical decision making and goals of care clarification.  ?Palliative Care was asked to follow this patient by consultation request of  Koirala, Dibas, MD to address advance care planning, complex medical decision making and goals of care clarification.  NP called [name] and obtained additional information from her; patient is limited due to cognitive deficits related to dementia. ?  ASSESSMENT AND / RECOMMENDATIONS:  ? ?CODE STATUS: Patient is a FULL code.  Plan is to [place]. ? ?Goals of Care: Goals include to maximize quality of life and  symptom management. ? ?Visit consisted of counseling and education dealing with the complex and emotionally intense issues of symptom management and palliative care in the setting of serious and potentially life-threatening illness. Patient has a supportive close-knit family.Palliative care team will continue to support patient, patient's family, and medical team.   ? ?Symptom Management/Plan: ?Dementia: memory loss/confusion at baseline . Continue Aricept and Namenda.  Encourage easy puzzles and word search and participation in activities of choice.  Safety precautions in place. 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 Patient is on Clozapine, Haldol.  Tolerating well. ?Type 2 DM: A1c 6.21 June 2021, 7.7 in March 2022. Repeat A1c in 3 months HTN: Continue diabetic diet, no concentrated sweets. ?Follow up: Palliative care will continue to follow for complex medical decision making, advance care planning, and clarification of goals. Return 6 weeks or prn.Encouraged to call provider sooner with any concerns.  ? ?Family /Caregiver/Community Supports: Patient lives at home alone. Her daughters visit often and help with ADLs. [name] has applied for CAP to get more service hours for patient. She currently gets 80 hours of nursing aides per month. ? ?HOSPICE ELIGIBILITY/DIAGNOSIS: TBD ? ?Chief Complaint: Follow-up visit ? ?HISTORY OF PRESENT ILLNESS:  [name] is a 81 y.o. year old female  with multiple medical conditions including Dementia , Type 2 diabetes mellitus, congestive heart failure, paranoid schizophrenia.  Patient denied pain/discomfort, in no respiratory distress. ?History obtained from review of EMR, discussion with primary team, caregiver, family and/or Ms. Hilbert.  ?Review and summarization of Epic records shows history from other than patient. Rest of 10 point ROS asked and negative. ?   ?  Review of lab tests/diagnostics   ?Results for EMELYNN, RANCE (MRN  [id number]) as of 11/22/2020 11:51 ? Ref. Range 06/05/2018 14:11  ?WBC Latest Ref Range: 4.0 - 10.5 K/uL 12.6 (H)  ?RBC Latest Ref Range: 3.87 - 5.11 MIL/uL 4.10  ?Hemoglobin Latest Ref Range: 12.0 - 15.0 g/dL 11.6 (L)  ?HCT Latest Ref Range: 36.0 - 46.0 % 38.6  ?MCV Latest Ref Range: 80.0 - 100.0 fL 94.1  ?MCH Latest Ref Range: 26.0 - 34.0 pg 28.3  ?MCHC Latest Ref Range: 30.0 - 36.0 g/dL 30.1  ?RDW Latest Ref Range: 11.5 - 15.5 % 16.0 (H)  ?Platelets Latest Ref Range: 150 - 400 K/uL 389  ?Results for KEISHANA, KLINGER (MRN [id number]) as of 11/22/2020 11:51 ? Ref. Range 06/05/2018 14:11  ?Sodium Latest Ref Range: 135 - 145 mmol/L 142  ?Potassium Latest Ref Range: 3.5 - 5.1 mmol/L 3.3 (L)  ?Chloride Latest Ref Range: 98 - 111 mmol/L 104  ?CO2 Latest Ref Range: 22 - 32 mmol/L 29  ?Glucose Latest Ref Range: 70 - 99 mg/dL 94  ?BUN Latest Ref Range: 8 - 23 mg/dL 12  ?Creatinine Latest Ref Range: 0.44 - 1.00 mg/dL 0.87  ?Calcium Latest Ref Range: 8.9 - 10.3 mg/dL 9.5  ?Anion gap Latest Ref Range: 5 - 15  9  ?Alkaline Phosphatase Latest Ref Range: 38 - 126 U/L 97  ?Albumin Latest Ref Range: 3.5 - 5.0 g/dL 3.8  ?Lipase Latest Ref Range: 11 - 51 U/L 27  ?AST Latest Ref Range: 15 - 41 U/L 18  ?ALT Latest Ref Range: 0 - 44 U/L 13  ?Total Protein Latest Ref Range: 6.5 - 8.1 g/dL 7.5  ?Total Bilirubin Latest Ref Range: 0.3 - 1.2 mg/dL 0.4  ?GFR, Est Non African American Latest Ref Range: >60 mL/min >60  ?GFR, Est African American Latest Ref Range: >60 mL/min >60  ? ? ? ? ?PAST MEDICAL HISTORY:  ?Active Ambulatory Problems  ?  Diagnosis Date Noted  ? Type II diabetes mellitus, well controlled ([facility]) 09/04/2006  ? HYPERLIPIDEMIA 06/02/2007  ? ANEMIA-IRON DEFICIENCY 09/04/2006  ? Dementia ([facility]) 06/02/2007  ? Paranoid schizophrenia ([facility]) 06/02/2007  ? ANXIETY 09/04/2006  ? PERIPHERAL NEUROPATHY 09/04/2006  ? Essential hypertension 09/04/2006  ? CORONARY ARTERY DISEASE 09/04/2006  ? PERIPHERAL VASCULAR DISEASE 06/02/2007  ? ALLERGIC RHINITIS  06/02/2007  ? GERD 09/04/2006  ? IBS 06/02/2007  ? Acute cystitis without hematuria 06/30/2007  ? Chronic back pain 10/26/2007  ? VERTIGO 06/22/2008  ? FATIGUE 06/02/2007  ? PERIPHERAL EDEMA 06/02/2007  ? DYSPHAGIA, PHARYNGOESOPHAGEAL PHASE 02/07/2009  ? TOBACCO USE, QUIT 02/03/2009  ? Extrapyramidal disorder   ? SIRS (systemic inflammatory response syndrome) ([facility]) 12/23/2014  ? Breast cancer of upper-outer quadrant of left female breast ([facility]) 08/29/2015  ? Chronic diastolic CHF (congestive heart failure) ([facility]) 11/22/2017  ? Metabolic encephalopathy   ? Sepsis ([facility])   ? ?Resolved Ambulatory Problems  ?  Diagnosis Date Noted  ? THRUSH 02/03/2009  ? DEHYDRATION (VOLUME DEPLETION) 02/03/2009  ? RENAL FAILURE, ACUTE 02/07/2009  ? RENAL INSUFFICIENCY 02/05/2009  ? UTI 06/02/2007  ? Nausea alone 02/03/2009  ? HX, PERSONAL, SCHIZOPHRENIA 09/04/2006  ? Community acquired pneumonia 12/09/2014  ? CAP (community acquired pneumonia) 12/09/2014  ? Acute respiratory failure with hypoxia ([facility]) 12/10/2014  ? ARDS (adult respiratory distress syndrome) ([facility]) 12/10/2014  ? Blood poisoning   ? Acute respiratory failure with hypoxemia (HCC)   ? Hypernatremia 12/23/2014  ? Hypokalemia 12/23/2014  ? Anemia 12/23/2014  ? Upper GI  bleed 11/01/2015  ? GI bleed 11/01/2015  ? Septic shock ([place]) 11/02/2015  ? Acute blood loss anemia 11/02/2015  ? Acute kidney injury ([place]) 11/02/2015  ? Altered mental status   ? ?Past Medical History:  ?Diagnosis Date  ? Allergic rhinitis   ? Anemia, iron deficiency   ? Anxiety   ? Arthritis   ? CAD (coronary artery disease)   ? Cancer [facility])   ? Esophageal motility disorder   ? GERD (gastroesophageal reflux disease)   ? HTN (hypertension)   ? Hyperlipidemia   ? IBS (irritable bowel syndrome)   ? Peripheral neuropathy   ? Pneumonia 12-09-2014  ? PVD (peripheral vascular disease) ([place])   ? Renal insufficiency   ? Schizophrenia ([facility])   ? Syncope   ? Type II or unspecified type diabetes mellitus without mention of  complication, not stated as uncontrolled   ? ? ?SOCIAL HX:  ?Social History  ? ?Tobacco Use  ? Smoking status: Former  ? Smokeless tobacco: Never  ?Substance Use Topics  ? Alcohol use: No  ? ?  ?FAMILY HX:  ?[place]

## 2021-07-10 DIAGNOSIS — Z79899 Other long term (current) drug therapy: Secondary | ICD-10-CM | POA: Diagnosis not present

## 2021-07-15 ENCOUNTER — Emergency Department (HOSPITAL_COMMUNITY): Payer: Medicare Other

## 2021-07-15 ENCOUNTER — Encounter (HOSPITAL_COMMUNITY): Payer: Self-pay

## 2021-07-15 ENCOUNTER — Inpatient Hospital Stay (HOSPITAL_COMMUNITY)
Admission: EM | Admit: 2021-07-15 | Discharge: 2021-07-20 | DRG: 690 | Disposition: A | Discharge status: Skilled Nursing Facility | Payer: Medicare Other | Attending: Internal Medicine | Admitting: Internal Medicine

## 2021-07-15 ENCOUNTER — Other Ambulatory Visit: Payer: Self-pay

## 2021-07-15 DIAGNOSIS — R112 Nausea with vomiting, unspecified: Secondary | ICD-10-CM | POA: Diagnosis present

## 2021-07-15 DIAGNOSIS — Z853 Personal history of malignant neoplasm of breast: Secondary | ICD-10-CM | POA: Diagnosis not present

## 2021-07-15 DIAGNOSIS — Z833 Family history of diabetes mellitus: Secondary | ICD-10-CM

## 2021-07-15 DIAGNOSIS — Z8673 Personal history of transient ischemic attack (TIA), and cerebral infarction without residual deficits: Secondary | ICD-10-CM

## 2021-07-15 DIAGNOSIS — R29810 Facial weakness: Secondary | ICD-10-CM | POA: Diagnosis not present

## 2021-07-15 DIAGNOSIS — W19XXXA Unspecified fall, initial encounter: Secondary | ICD-10-CM | POA: Diagnosis not present

## 2021-07-15 DIAGNOSIS — Z66 Do not resuscitate: Secondary | ICD-10-CM | POA: Diagnosis present

## 2021-07-15 DIAGNOSIS — M545 Low back pain, unspecified: Secondary | ICD-10-CM | POA: Diagnosis not present

## 2021-07-15 DIAGNOSIS — E1169 Type 2 diabetes mellitus with other specified complication: Secondary | ICD-10-CM | POA: Diagnosis present

## 2021-07-15 DIAGNOSIS — R42 Dizziness and giddiness: Secondary | ICD-10-CM | POA: Diagnosis not present

## 2021-07-15 DIAGNOSIS — I10 Essential (primary) hypertension: Secondary | ICD-10-CM | POA: Diagnosis present

## 2021-07-15 DIAGNOSIS — E119 Type 2 diabetes mellitus without complications: Secondary | ICD-10-CM

## 2021-07-15 DIAGNOSIS — I110 Hypertensive heart disease with heart failure: Secondary | ICD-10-CM | POA: Diagnosis present

## 2021-07-15 DIAGNOSIS — E782 Mixed hyperlipidemia: Secondary | ICD-10-CM | POA: Diagnosis not present

## 2021-07-15 DIAGNOSIS — Z87891 Personal history of nicotine dependence: Secondary | ICD-10-CM

## 2021-07-15 DIAGNOSIS — Z888 Allergy status to other drugs, medicaments and biological substances status: Secondary | ICD-10-CM

## 2021-07-15 DIAGNOSIS — Z8249 Family history of ischemic heart disease and other diseases of the circulatory system: Secondary | ICD-10-CM

## 2021-07-15 DIAGNOSIS — I5032 Chronic diastolic (congestive) heart failure: Secondary | ICD-10-CM | POA: Diagnosis present

## 2021-07-15 DIAGNOSIS — H5509 Other forms of nystagmus: Secondary | ICD-10-CM | POA: Diagnosis present

## 2021-07-15 DIAGNOSIS — M419 Scoliosis, unspecified: Secondary | ICD-10-CM | POA: Diagnosis not present

## 2021-07-15 DIAGNOSIS — H8110 Benign paroxysmal vertigo, unspecified ear: Secondary | ICD-10-CM | POA: Diagnosis present

## 2021-07-15 DIAGNOSIS — Z885 Allergy status to narcotic agent status: Secondary | ICD-10-CM

## 2021-07-15 DIAGNOSIS — Z91013 Allergy to seafood: Secondary | ICD-10-CM

## 2021-07-15 DIAGNOSIS — D509 Iron deficiency anemia, unspecified: Secondary | ICD-10-CM | POA: Diagnosis present

## 2021-07-15 DIAGNOSIS — F419 Anxiety disorder, unspecified: Secondary | ICD-10-CM | POA: Diagnosis present

## 2021-07-15 DIAGNOSIS — K219 Gastro-esophageal reflux disease without esophagitis: Secondary | ICD-10-CM | POA: Diagnosis present

## 2021-07-15 DIAGNOSIS — Z886 Allergy status to analgesic agent status: Secondary | ICD-10-CM

## 2021-07-15 DIAGNOSIS — E1151 Type 2 diabetes mellitus with diabetic peripheral angiopathy without gangrene: Secondary | ICD-10-CM | POA: Diagnosis present

## 2021-07-15 DIAGNOSIS — M5459 Other low back pain: Secondary | ICD-10-CM | POA: Diagnosis not present

## 2021-07-15 DIAGNOSIS — N3000 Acute cystitis without hematuria: Secondary | ICD-10-CM | POA: Diagnosis not present

## 2021-07-15 DIAGNOSIS — R296 Repeated falls: Secondary | ICD-10-CM | POA: Diagnosis present

## 2021-07-15 DIAGNOSIS — R109 Unspecified abdominal pain: Secondary | ICD-10-CM

## 2021-07-15 DIAGNOSIS — Z79899 Other long term (current) drug therapy: Secondary | ICD-10-CM | POA: Diagnosis not present

## 2021-07-15 DIAGNOSIS — I2510 Atherosclerotic heart disease of native coronary artery without angina pectoris: Secondary | ICD-10-CM | POA: Diagnosis present

## 2021-07-15 DIAGNOSIS — Z9189 Other specified personal risk factors, not elsewhere classified: Secondary | ICD-10-CM

## 2021-07-15 DIAGNOSIS — Z881 Allergy status to other antibiotic agents status: Secondary | ICD-10-CM

## 2021-07-15 DIAGNOSIS — F0390 Unspecified dementia without behavioral disturbance: Secondary | ICD-10-CM | POA: Diagnosis present

## 2021-07-15 DIAGNOSIS — F200 Paranoid schizophrenia: Secondary | ICD-10-CM | POA: Diagnosis present

## 2021-07-15 DIAGNOSIS — Z743 Need for continuous supervision: Secondary | ICD-10-CM | POA: Diagnosis not present

## 2021-07-15 DIAGNOSIS — Z794 Long term (current) use of insulin: Secondary | ICD-10-CM | POA: Diagnosis not present

## 2021-07-15 DIAGNOSIS — R404 Transient alteration of awareness: Secondary | ICD-10-CM | POA: Diagnosis not present

## 2021-07-15 DIAGNOSIS — R197 Diarrhea, unspecified: Secondary | ICD-10-CM | POA: Diagnosis not present

## 2021-07-15 DIAGNOSIS — M1990 Unspecified osteoarthritis, unspecified site: Secondary | ICD-10-CM | POA: Diagnosis present

## 2021-07-15 LAB — MAGNESIUM: Magnesium: 1.9 mg/dL (ref 1.7–2.4)

## 2021-07-15 LAB — CBC WITH DIFFERENTIAL/PLATELET
Abs Immature Granulocytes: 0.06 10*3/uL (ref 0.00–0.07)
Basophils Absolute: 0 10*3/uL (ref 0.0–0.1)
Basophils Relative: 0 %
Eosinophils Absolute: 0 10*3/uL (ref 0.0–0.5)
Eosinophils Relative: 0 %
HCT: 32.8 % — ABNORMAL LOW (ref 36.0–46.0)
Hemoglobin: 11 g/dL — ABNORMAL LOW (ref 12.0–15.0)
Immature Granulocytes: 0 %
Lymphocytes Relative: 17 %
Lymphs Abs: 2.5 10*3/uL (ref 0.7–4.0)
MCH: 31.3 pg (ref 26.0–34.0)
MCHC: 33.5 g/dL (ref 30.0–36.0)
MCV: 93.2 fL (ref 80.0–100.0)
Monocytes Absolute: 1.4 10*3/uL — ABNORMAL HIGH (ref 0.1–1.0)
Monocytes Relative: 9 %
Neutro Abs: 11.1 10*3/uL — ABNORMAL HIGH (ref 1.7–7.7)
Neutrophils Relative %: 74 %
Platelets: 250 10*3/uL (ref 150–400)
RBC: 3.52 MIL/uL — ABNORMAL LOW (ref 3.87–5.11)
RDW: 15.1 % (ref 11.5–15.5)
WBC: 15.1 10*3/uL — ABNORMAL HIGH (ref 4.0–10.5)
nRBC: 0 % (ref 0.0–0.2)

## 2021-07-15 LAB — COMPREHENSIVE METABOLIC PANEL
ALT: 21 U/L (ref 0–44)
AST: 27 U/L (ref 15–41)
Albumin: 2.3 g/dL — ABNORMAL LOW (ref 3.5–5.0)
Alkaline Phosphatase: 85 U/L (ref 38–126)
Anion gap: 9 (ref 5–15)
BUN: 9 mg/dL (ref 8–23)
CO2: 25 mmol/L (ref 22–32)
Calcium: 8.6 mg/dL — ABNORMAL LOW (ref 8.9–10.3)
Chloride: 110 mmol/L (ref 98–111)
Creatinine, Ser: 0.83 mg/dL (ref 0.44–1.00)
GFR, Estimated: >60 mL/min (ref >60)
Glucose, Bld: 124 mg/dL — ABNORMAL HIGH (ref 70–99)
Potassium: 3.3 mmol/L — ABNORMAL LOW (ref 3.5–5.1)
Sodium: 144 mmol/L (ref 135–145)
Total Bilirubin: 0.5 mg/dL (ref 0.3–1.2)
Total Protein: 5.7 g/dL — ABNORMAL LOW (ref 6.5–8.1)

## 2021-07-15 LAB — URINALYSIS, ROUTINE W REFLEX MICROSCOPIC
Bilirubin Urine: NEGATIVE
Glucose, UA: NEGATIVE mg/dL
Hgb urine dipstick: NEGATIVE
Ketones, ur: NEGATIVE mg/dL
Nitrite: POSITIVE — AB
Protein, ur: NEGATIVE mg/dL
Specific Gravity, Urine: 1.005 (ref 1.005–1.030)
WBC, UA: >50 WBC/hpf — ABNORMAL HIGH (ref 0–5)
pH: 7 (ref 5.0–8.0)

## 2021-07-15 LAB — CBG MONITORING, ED: Glucose-Capillary: 134 mg/dL — ABNORMAL HIGH (ref 70–99)

## 2021-07-15 MED ORDER — MECLIZINE HCL 25 MG PO TABS
12.5000 mg | ORAL_TABLET | Freq: Once | ORAL | Status: AC
Start: 2021-07-15 — End: 2021-07-15
  Administered 2021-07-15: 12.5 mg via ORAL
  Filled 2021-07-15: qty 1

## 2021-07-15 MED ORDER — SODIUM CHLORIDE 0.9 % IV SOLN
1.0000 g | Freq: Once | INTRAVENOUS | Status: AC
  Administered 2021-07-15: 1 g via INTRAVENOUS
  Filled 2021-07-15: qty 10

## 2021-07-15 MED ORDER — LORAZEPAM 2 MG/ML IJ SOLN: 1.0000 mg | Freq: Once | INTRAMUSCULAR | Status: DC

## 2021-07-15 MED ORDER — SODIUM CHLORIDE 0.9 % IV BOLUS
1000.0000 mL | Freq: Once | INTRAVENOUS | Status: AC
  Administered 2021-07-15: 1000 mL via INTRAVENOUS

## 2021-07-15 MED ORDER — ONDANSETRON HCL 4 MG/2ML IJ SOLN
4.0000 mg | Freq: Once | INTRAMUSCULAR | Status: AC
  Administered 2021-07-15: 4 mg via INTRAVENOUS
  Filled 2021-07-15: qty 2

## 2021-07-15 NOTE — ED Notes (Signed)
Daughter Arbie Cookey [phone] would like an update asap ?

## 2021-07-15 NOTE — ED Triage Notes (Signed)
Pt arrived to ED via EMS from [facility] w/ c/o dizziness x 3 days w/ N/V and 2 falls today and 1 fall either yesterday or the day before. R facial droop is normal per pt along w/ decreased sensation to R side of face. VSS w/ EMS other than HR 110 ?

## 2021-07-15 NOTE — ED Provider Notes (Signed)
?[facility] ?Provider Note ? ? ?CSN: [id number] ?Arrival date & time: 07/15/21  1719 ? ?  ? ?History ? ?Chief Complaint  ?Patient presents with  ? Dizziness  ? Frequent Falls  ? ? ?[name] is a 81 y.o. female. ? ?Pt is a 81 yo female with a pmhx significant for dementia , Type 2 diabetes mellitus, htn, anemia, gerd, pvd, ibs, ckd, cad, breast cancer and congestive heart failure, and paranoid schizophrenia.  She is a resident at independent living and walks with a walker.  She said she has been feeling dizzy for the past 3 days.  She has had 2 falls today and 1 fall yesterday.  She said she feels like the room is going in a circle.  She said her back always hurts, but it is worse after the fall.   ? ?Pt's daughter said she's had n/v/d for the past few days.  She became sick after eating a [employer] fish sandwich. ? ? ?  ? ?Home Medications ?Prior to Admission medications   ?Medication Sig Start Date End Date Taking? Authorizing Provider  ?ACETAMINOPHEN-PAMABROM PO Take 1 tablet by mouth 2 (two) times daily. "Back Aid"    [provider]  ?amLODipine (NORVASC) 10 MG tablet Take 10 mg by mouth daily.     [provider]  ?atorvastatin (LIPITOR) 40 MG tablet Take 40 mg by mouth daily.    [provider]  ?carvedilol (COREG) 12.5 MG tablet Take 1 tablet (12.5 mg total) by mouth 2 (two) times daily with a meal. 11/21/17   Danford, Suann Larry, MD  ?Chlorphen-Phenyleph-ASA (ALKA-SELTZER PLUS COLD PO) Take 1 tablet by mouth 2 (two) times daily.    [provider]  ?cloNIDine (CATAPRES - DOSED IN MG/24 HR) 0.2 mg/24hr patch Place 1 patch (0.2 mg total) onto the skin every Monday at 6 PM. 11/24/17   Danford, Suann Larry, MD  ?cloZAPine (CLOZARIL) 100 MG tablet Take 2 tablets (200 mg total) by mouth at bedtime. 11/27/17   Eugenie Filler, MD  ?dicyclomine (BENTYL) 20 MG tablet Take 1 tablet (20 mg total) by mouth 2 (two) times daily. 06/05/18    Margarita Mail, PA-C  ?donepezil (ARICEPT) 10 MG tablet Take 10 mg by mouth at bedtime.    [provider]  ?esomeprazole (NEXIUM) 40 MG capsule Take 40 mg by mouth 2 (two) times daily.    [provider]  ?feeding supplement, GLUCERNA SHAKE, (GLUCERNA SHAKE) LIQD Take 237 mLs by mouth 3 (three) times daily between meals. ?Patient not taking: Reported on 11/16/2017 11/09/15   Murlean Iba, MD  ?fluticasone (FLONASE) 50 MCG/ACT nasal spray Place 2 sprays into both nostrils daily. 11/28/17   Eugenie Filler, MD  ?furosemide (LASIX) 40 MG tablet Take 40 mg by mouth daily.     [provider]  ?gabapentin (NEURONTIN) 100 MG capsule Take 100 mg by mouth at bedtime as needed (pain).    [provider]  ?haloperidol (HALDOL) 5 MG tablet Take 5-10 mg by mouth See admin instructions. Take one tablet (5 mg) by mouth every morning and two tablets (10 mg) at bedtime as tolerated    [provider]  ?HYDROcodone-acetaminophen (NORCO/VICODIN) 5-325 MG tablet Take 1-2 tablets by mouth every 6 (six) hours as needed for moderate pain. 11/27/17   Eugenie Filler, MD  ?insulin aspart (NOVOLOG) 100 UNIT/ML injection Inject 3 Units into the skin 3 (three) times daily with meals. 11/27/17   [place]ville Silos,  Malachy Moan, MD  ?insulin glargine (LANTUS) 100 UNIT/ML injection Inject 0.08 mLs (8 Units total) into the skin at bedtime. 11/27/17   Eugenie Filler, MD  ?loratadine (CLARITIN) 10 MG tablet Take 1 tablet (10 mg total) by mouth daily. 11/28/17   Eugenie Filler, MD  ?memantine (NAMENDA) 10 MG tablet Take 1 tablet (10 mg total) by mouth 2 (two) times daily. 10/09/12   Norins, Heinz Knuckles, MD  ?mirtazapine (REMERON) 45 MG tablet Take 45 mg by mouth at bedtime.     [provider]  ?potassium chloride (KLOR-CON) 8 MEQ tablet Take 1 tablet (8 mEq total) by mouth daily. ?Patient taking differently: Take 16 mEq by mouth daily.  04/28/12   Norins, Heinz Knuckles, MD  ?ranitidine (ZANTAC) 150 MG  capsule Take 150 mg by mouth 2 (two) times daily.    [provider]  ?senna-docusate (SENOKOT-S) 8.6-50 MG tablet Take 1 tablet by mouth at bedtime as needed for mild constipation. 11/27/17   Eugenie Filler, MD  ?valsartan (DIOVAN) 160 MG tablet Take 160 mg by mouth daily.    [provider]  ?   ? ?Allergies    ?Codeine phosphate, Dexilant [dexlansoprazole], Glimepiride, Glyburide, Levofloxacin, Shellfish allergy, and Tramadol   ? ?Review of Systems   ?Review of Systems  ?Musculoskeletal:  Positive for back pain.  ?Neurological:  Positive for dizziness.  ?All other systems reviewed and are negative. ? ?Physical Exam ?Updated Vital Signs ?BP (!) 161/82   Pulse 96   Temp 98.3 ?F (36.8 ?C) (Oral)   Resp 14   Ht 5' (1.524 m)   Wt 69.4 kg   SpO2 97%   BMI 29.88 kg/m?  ?Physical Exam ?Vitals and nursing note reviewed.  ?Constitutional:   ?   Appearance: Normal appearance.  ?HENT:  ?   Head: Normocephalic and atraumatic.  ?   Right Ear: External ear normal.  ?   Left Ear: External ear normal.  ?   Nose: Nose normal.  ?   Mouth/Throat:  ?   Mouth: Mucous membranes are moist.  ?   Pharynx: Oropharynx is clear.  ?Eyes:  ?   Extraocular Movements: Extraocular movements intact.  ?   Conjunctiva/sclera: Conjunctivae normal.  ?   Pupils: Pupils are equal, round, and reactive to light.  ?Cardiovascular:  ?   Rate and Rhythm: Normal rate and regular rhythm.  ?   Pulses: Normal pulses.  ?   Heart sounds: Normal heart sounds.  ?Pulmonary:  ?   Effort: Pulmonary effort is normal.  ?   Breath sounds: Normal breath sounds.  ?Abdominal:  ?   General: Abdomen is flat. Bowel sounds are normal.  ?   Palpations: Abdomen is soft.  ?Musculoskeletal:     ?   General: Normal range of motion.  ?   Cervical back: Normal range of motion and neck supple.  ?Skin: ?   General: Skin is warm.  ?   Capillary Refill: Capillary refill takes less than 2 seconds.  ?Neurological:  ?   General: No focal deficit present.  ?   Mental  Status: She is alert.  ?   Comments: Weak legs bilaterally  ?Psychiatric:     ?   Mood and Affect: Mood normal.     ?   Behavior: Behavior normal.  ? ? ?ED Results / Procedures / Treatments   ?Labs ?(all labs ordered are listed, but only abnormal results are displayed) ?Labs Reviewed  ?CBC WITH DIFFERENTIAL/PLATELET -  Abnormal; Notable for the following components:  ?    Result Value  ? WBC 15.1 (*)   ? RBC 3.52 (*)   ? Hemoglobin 11.0 (*)   ? HCT 32.8 (*)   ? Neutro Abs 11.1 (*)   ? Monocytes Absolute 1.4 (*)   ? All other components within normal limits  ?COMPREHENSIVE METABOLIC PANEL - Abnormal; Notable for the following components:  ? Potassium 3.3 (*)   ? Glucose, Bld 124 (*)   ? Calcium 8.6 (*)   ? Total Protein 5.7 (*)   ? Albumin 2.3 (*)   ? All other components within normal limits  ?URINALYSIS, ROUTINE W REFLEX MICROSCOPIC - Abnormal; Notable for the following components:  ? APPearance HAZY (*)   ? Nitrite POSITIVE (*)   ? Leukocytes,Ua LARGE (*)   ? WBC, UA >50 (*)   ? Bacteria, UA MANY (*)   ? All other components within normal limits  ?CBG MONITORING, ED - Abnormal; Notable for the following components:  ? Glucose-Capillary 134 (*)   ? All other components within normal limits  ?URINE CULTURE  ?MAGNESIUM  ? ? ?EKG ?EKG Interpretation ? ?Date/Time:  Sunday July 15 2021 17:36:24 EDT ?Ventricular Rate:  96 ?PR Interval:  132 ?QRS Duration: 99 ?QT Interval:  365 ?QTC Calculation: 462 ?R Axis:   2 ?Text Interpretation: Sinus rhythm No significant change since last tracing Confirmed by Isla Pence [phone]) on 07/15/2021 5:56:46 PM ? ?Radiology ?DG Chest 1 View ? ?Result Date: 07/15/2021 ?CLINICAL DATA:  Dizziness, fall EXAM: CHEST  1 VIEW COMPARISON:  None. FINDINGS: Transverse diameter of heart is increased. Apparent shift of mediastinum to the right may be due to rotation. Surgical clips are seen in the left chest wall. There are linear densities in both lower lung fields. There is no focal pulmonary  consolidation. There are no signs of alveolar pulmonary edema. There is no pleural effusion or pneumothorax. Right hemidiaphragm is elevated. IMPRESSION: Linear densities in the lower lung fields may suggest scarrin

## 2021-07-16 ENCOUNTER — Observation Stay (HOSPITAL_COMMUNITY): Payer: Medicare Other

## 2021-07-16 ENCOUNTER — Encounter (HOSPITAL_COMMUNITY): Payer: Self-pay | Admitting: Internal Medicine

## 2021-07-16 DIAGNOSIS — I110 Hypertensive heart disease with heart failure: Secondary | ICD-10-CM | POA: Diagnosis present

## 2021-07-16 DIAGNOSIS — R42 Dizziness and giddiness: Secondary | ICD-10-CM

## 2021-07-16 DIAGNOSIS — Z794 Long term (current) use of insulin: Secondary | ICD-10-CM

## 2021-07-16 DIAGNOSIS — Z7401 Bed confinement status: Secondary | ICD-10-CM | POA: Diagnosis not present

## 2021-07-16 DIAGNOSIS — I5032 Chronic diastolic (congestive) heart failure: Secondary | ICD-10-CM

## 2021-07-16 DIAGNOSIS — E119 Type 2 diabetes mellitus without complications: Secondary | ICD-10-CM | POA: Diagnosis not present

## 2021-07-16 DIAGNOSIS — K219 Gastro-esophageal reflux disease without esophagitis: Secondary | ICD-10-CM | POA: Diagnosis not present

## 2021-07-16 DIAGNOSIS — A419 Sepsis, unspecified organism: Secondary | ICD-10-CM | POA: Diagnosis not present

## 2021-07-16 DIAGNOSIS — R6889 Other general symptoms and signs: Secondary | ICD-10-CM | POA: Diagnosis not present

## 2021-07-16 DIAGNOSIS — I10 Essential (primary) hypertension: Secondary | ICD-10-CM

## 2021-07-16 DIAGNOSIS — N3000 Acute cystitis without hematuria: Principal | ICD-10-CM

## 2021-07-16 DIAGNOSIS — Z743 Need for continuous supervision: Secondary | ICD-10-CM | POA: Diagnosis not present

## 2021-07-16 DIAGNOSIS — H5509 Other forms of nystagmus: Secondary | ICD-10-CM | POA: Diagnosis present

## 2021-07-16 DIAGNOSIS — E782 Mixed hyperlipidemia: Secondary | ICD-10-CM

## 2021-07-16 DIAGNOSIS — Z853 Personal history of malignant neoplasm of breast: Secondary | ICD-10-CM | POA: Diagnosis not present

## 2021-07-16 DIAGNOSIS — R296 Repeated falls: Secondary | ICD-10-CM

## 2021-07-16 DIAGNOSIS — M6281 Muscle weakness (generalized): Secondary | ICD-10-CM | POA: Diagnosis not present

## 2021-07-16 DIAGNOSIS — M5137 Other intervertebral disc degeneration, lumbosacral region: Secondary | ICD-10-CM | POA: Diagnosis not present

## 2021-07-16 DIAGNOSIS — Z79899 Other long term (current) drug therapy: Secondary | ICD-10-CM | POA: Diagnosis not present

## 2021-07-16 DIAGNOSIS — Z8249 Family history of ischemic heart disease and other diseases of the circulatory system: Secondary | ICD-10-CM | POA: Diagnosis not present

## 2021-07-16 DIAGNOSIS — E1169 Type 2 diabetes mellitus with other specified complication: Secondary | ICD-10-CM

## 2021-07-16 DIAGNOSIS — R112 Nausea with vomiting, unspecified: Secondary | ICD-10-CM | POA: Diagnosis not present

## 2021-07-16 DIAGNOSIS — E440 Moderate protein-calorie malnutrition: Secondary | ICD-10-CM | POA: Diagnosis not present

## 2021-07-16 DIAGNOSIS — I2510 Atherosclerotic heart disease of native coronary artery without angina pectoris: Secondary | ICD-10-CM

## 2021-07-16 DIAGNOSIS — F200 Paranoid schizophrenia: Secondary | ICD-10-CM

## 2021-07-16 DIAGNOSIS — F0390 Unspecified dementia without behavioral disturbance: Secondary | ICD-10-CM | POA: Diagnosis present

## 2021-07-16 DIAGNOSIS — D509 Iron deficiency anemia, unspecified: Secondary | ICD-10-CM | POA: Diagnosis present

## 2021-07-16 DIAGNOSIS — Z9189 Other specified personal risk factors, not elsewhere classified: Secondary | ICD-10-CM | POA: Diagnosis not present

## 2021-07-16 DIAGNOSIS — R2681 Unsteadiness on feet: Secondary | ICD-10-CM | POA: Diagnosis not present

## 2021-07-16 DIAGNOSIS — E1151 Type 2 diabetes mellitus with diabetic peripheral angiopathy without gangrene: Secondary | ICD-10-CM | POA: Diagnosis present

## 2021-07-16 DIAGNOSIS — E785 Hyperlipidemia, unspecified: Secondary | ICD-10-CM | POA: Diagnosis not present

## 2021-07-16 DIAGNOSIS — Z87891 Personal history of nicotine dependence: Secondary | ICD-10-CM | POA: Diagnosis not present

## 2021-07-16 DIAGNOSIS — R1084 Generalized abdominal pain: Secondary | ICD-10-CM

## 2021-07-16 DIAGNOSIS — R197 Diarrhea, unspecified: Secondary | ICD-10-CM | POA: Diagnosis present

## 2021-07-16 DIAGNOSIS — W19XXXA Unspecified fall, initial encounter: Secondary | ICD-10-CM | POA: Diagnosis present

## 2021-07-16 DIAGNOSIS — Z66 Do not resuscitate: Secondary | ICD-10-CM | POA: Diagnosis present

## 2021-07-16 DIAGNOSIS — H8110 Benign paroxysmal vertigo, unspecified ear: Secondary | ICD-10-CM | POA: Diagnosis present

## 2021-07-16 DIAGNOSIS — R109 Unspecified abdominal pain: Secondary | ICD-10-CM

## 2021-07-16 DIAGNOSIS — M6259 Muscle wasting and atrophy, not elsewhere classified, multiple sites: Secondary | ICD-10-CM | POA: Diagnosis not present

## 2021-07-16 DIAGNOSIS — Z833 Family history of diabetes mellitus: Secondary | ICD-10-CM | POA: Diagnosis not present

## 2021-07-16 DIAGNOSIS — I739 Peripheral vascular disease, unspecified: Secondary | ICD-10-CM | POA: Diagnosis not present

## 2021-07-16 DIAGNOSIS — R1312 Dysphagia, oropharyngeal phase: Secondary | ICD-10-CM | POA: Diagnosis not present

## 2021-07-16 DIAGNOSIS — Z8673 Personal history of transient ischemic attack (TIA), and cerebral infarction without residual deficits: Secondary | ICD-10-CM | POA: Diagnosis not present

## 2021-07-16 LAB — COMPREHENSIVE METABOLIC PANEL
ALT: 23 U/L (ref 0–44)
AST: 29 U/L (ref 15–41)
Albumin: 2 g/dL — ABNORMAL LOW (ref 3.5–5.0)
Alkaline Phosphatase: 74 U/L (ref 38–126)
Anion gap: 7 (ref 5–15)
BUN: 7 mg/dL — ABNORMAL LOW (ref 8–23)
CO2: 22 mmol/L (ref 22–32)
Calcium: 8.1 mg/dL — ABNORMAL LOW (ref 8.9–10.3)
Chloride: 116 mmol/L — ABNORMAL HIGH (ref 98–111)
Creatinine, Ser: 0.86 mg/dL (ref 0.44–1.00)
GFR, Estimated: >60 mL/min (ref >60)
Glucose, Bld: 157 mg/dL — ABNORMAL HIGH (ref 70–99)
Potassium: 3.5 mmol/L (ref 3.5–5.1)
Sodium: 145 mmol/L (ref 135–145)
Total Bilirubin: 0.4 mg/dL (ref 0.3–1.2)
Total Protein: 5.2 g/dL — ABNORMAL LOW (ref 6.5–8.1)

## 2021-07-16 LAB — CBC WITH DIFFERENTIAL/PLATELET
Abs Immature Granulocytes: 0.07 10*3/uL (ref 0.00–0.07)
Basophils Absolute: 0 10*3/uL (ref 0.0–0.1)
Basophils Relative: 0 %
Eosinophils Absolute: 0 10*3/uL (ref 0.0–0.5)
Eosinophils Relative: 0 %
HCT: 32.8 % — ABNORMAL LOW (ref 36.0–46.0)
Hemoglobin: 10.5 g/dL — ABNORMAL LOW (ref 12.0–15.0)
Immature Granulocytes: 1 %
Lymphocytes Relative: 15 %
Lymphs Abs: 1.9 10*3/uL (ref 0.7–4.0)
MCH: 30.9 pg (ref 26.0–34.0)
MCHC: 32 g/dL (ref 30.0–36.0)
MCV: 96.5 fL (ref 80.0–100.0)
Monocytes Absolute: 1 10*3/uL (ref 0.1–1.0)
Monocytes Relative: 9 %
Neutro Abs: 9.2 10*3/uL — ABNORMAL HIGH (ref 1.7–7.7)
Neutrophils Relative %: 75 %
Platelets: 227 10*3/uL (ref 150–400)
RBC: 3.4 MIL/uL — ABNORMAL LOW (ref 3.87–5.11)
RDW: 15.4 % (ref 11.5–15.5)
WBC: 12.3 10*3/uL — ABNORMAL HIGH (ref 4.0–10.5)
nRBC: 0 % (ref 0.0–0.2)

## 2021-07-16 LAB — GLUCOSE, CAPILLARY
Glucose-Capillary: 148 mg/dL — ABNORMAL HIGH (ref 70–99)
Glucose-Capillary: 100 mg/dL — ABNORMAL HIGH (ref 70–99)

## 2021-07-16 LAB — HEMOGLOBIN A1C
Hgb A1c MFr Bld: 5.9 % — ABNORMAL HIGH (ref 4.8–5.6)
Mean Plasma Glucose: 122.63 mg/dL

## 2021-07-16 LAB — MAGNESIUM: Magnesium: 1.7 mg/dL (ref 1.7–2.4)

## 2021-07-16 LAB — CBG MONITORING, ED
Glucose-Capillary: 110 mg/dL — ABNORMAL HIGH (ref 70–99)
Glucose-Capillary: 118 mg/dL — ABNORMAL HIGH (ref 70–99)

## 2021-07-16 LAB — LACTIC ACID, PLASMA: Lactic Acid, Venous: 1.7 mmol/L (ref 0.5–1.9)

## 2021-07-16 MED ORDER — POLYETHYLENE GLYCOL 3350 17 G PO PACK
17.0000 g | PACK | Freq: Every day | ORAL | Status: DC | PRN
  Administered 2021-07-17: 17 g via ORAL
  Filled 2021-07-16: qty 1

## 2021-07-16 MED ORDER — CLOZAPINE 25 MG PO TABS
150.0000 mg | ORAL_TABLET | Freq: Every day | ORAL | Status: DC
  Administered 2021-07-16 – 2021-07-19 (×4): 150 mg via ORAL
  Filled 2021-07-16 (×6): qty 2

## 2021-07-16 MED ORDER — MIRTAZAPINE 15 MG PO TABS
45.0000 mg | ORAL_TABLET | Freq: Every day | ORAL | Status: DC
  Filled 2021-07-16: qty 3

## 2021-07-16 MED ORDER — ATORVASTATIN CALCIUM 40 MG PO TABS
40.0000 mg | ORAL_TABLET | Freq: Every day | ORAL | Status: DC
  Administered 2021-07-16 – 2021-07-20 (×5): 40 mg via ORAL
  Filled 2021-07-16 (×5): qty 1

## 2021-07-16 MED ORDER — ASPIRIN EC 81 MG PO TBEC
81.0000 mg | DELAYED_RELEASE_TABLET | Freq: Every day | ORAL | Status: DC
  Administered 2021-07-16 – 2021-07-20 (×5): 81 mg via ORAL
  Filled 2021-07-16 (×5): qty 1

## 2021-07-16 MED ORDER — ONDANSETRON HCL 4 MG PO TABS: 4.0000 mg | ORAL_TABLET | Freq: Four times a day (QID) | ORAL | Status: DC | PRN

## 2021-07-16 MED ORDER — ACETAMINOPHEN 650 MG RE SUPP: 650.0000 mg | Freq: Four times a day (QID) | RECTAL | Status: DC | PRN

## 2021-07-16 MED ORDER — CLONIDINE HCL 0.2 MG/24HR TD PTWK: 0.2000 mg | MEDICATED_PATCH | TRANSDERMAL | Status: DC

## 2021-07-16 MED ORDER — SODIUM CHLORIDE 0.9 % IV BOLUS
1000.0000 mL | Freq: Once | INTRAVENOUS | Status: AC
  Administered 2021-07-16: 1000 mL via INTRAVENOUS

## 2021-07-16 MED ORDER — LORATADINE 10 MG PO TABS
10.0000 mg | ORAL_TABLET | Freq: Every day | ORAL | Status: DC
  Administered 2021-07-16 – 2021-07-20 (×5): 10 mg via ORAL
  Filled 2021-07-16 (×5): qty 1

## 2021-07-16 MED ORDER — MEMANTINE HCL 10 MG PO TABS
10.0000 mg | ORAL_TABLET | Freq: Two times a day (BID) | ORAL | Status: DC
Start: 2021-07-16 — End: 2021-07-20
  Administered 2021-07-16 – 2021-07-20 (×9): 10 mg via ORAL
  Filled 2021-07-16 (×11): qty 1

## 2021-07-16 MED ORDER — HYDRALAZINE HCL 20 MG/ML IJ SOLN
10.0000 mg | Freq: Four times a day (QID) | INTRAMUSCULAR | Status: DC | PRN
Start: 2021-07-16 — End: 2021-07-17

## 2021-07-16 MED ORDER — HALOPERIDOL 5 MG PO TABS
5.0000 mg | ORAL_TABLET | Freq: Every day | ORAL | Status: DC
  Administered 2021-07-16 – 2021-07-20 (×5): 5 mg via ORAL
  Filled 2021-07-16 (×5): qty 1

## 2021-07-16 MED ORDER — HALOPERIDOL 5 MG PO TABS: 5.0000 mg | ORAL_TABLET | ORAL | Status: DC

## 2021-07-16 MED ORDER — PANTOPRAZOLE SODIUM 40 MG PO TBEC
40.0000 mg | DELAYED_RELEASE_TABLET | Freq: Every day | ORAL | Status: DC
  Administered 2021-07-16 – 2021-07-20 (×5): 40 mg via ORAL
  Filled 2021-07-16 (×5): qty 1

## 2021-07-16 MED ORDER — AMLODIPINE BESYLATE 10 MG PO TABS
10.0000 mg | ORAL_TABLET | Freq: Every day | ORAL | Status: DC
  Administered 2021-07-16 – 2021-07-20 (×5): 10 mg via ORAL
  Filled 2021-07-16 (×3): qty 1
  Filled 2021-07-16: qty 2
  Filled 2021-07-16: qty 1

## 2021-07-16 MED ORDER — GABAPENTIN 100 MG PO CAPS: 100.0000 mg | ORAL_CAPSULE | Freq: Every evening | ORAL | Status: DC | PRN

## 2021-07-16 MED ORDER — MECLIZINE HCL 25 MG PO TABS: 12.5000 mg | ORAL_TABLET | Freq: Three times a day (TID) | ORAL | Status: DC | PRN

## 2021-07-16 MED ORDER — POTASSIUM CHLORIDE IN NACL 40-0.9 MEQ/L-% IV SOLN
INTRAVENOUS | Status: DC
  Filled 2021-07-16: qty 1000

## 2021-07-16 MED ORDER — ONDANSETRON HCL 4 MG/2ML IJ SOLN: 4.0000 mg | Freq: Four times a day (QID) | INTRAMUSCULAR | Status: DC | PRN

## 2021-07-16 MED ORDER — FLUTICASONE PROPIONATE 50 MCG/ACT NA SUSP
2.0000 | Freq: Every day | NASAL | Status: DC
  Administered 2021-07-16 – 2021-07-20 (×5): 2 via NASAL
  Filled 2021-07-16 (×2): qty 16

## 2021-07-16 MED ORDER — ENOXAPARIN SODIUM 40 MG/0.4ML IJ SOSY
40.0000 mg | PREFILLED_SYRINGE | INTRAMUSCULAR | Status: DC
  Administered 2021-07-16 – 2021-07-20 (×5): 40 mg via SUBCUTANEOUS
  Filled 2021-07-16 (×5): qty 0.4

## 2021-07-16 MED ORDER — CARVEDILOL 12.5 MG PO TABS
12.5000 mg | ORAL_TABLET | Freq: Two times a day (BID) | ORAL | Status: DC
  Administered 2021-07-16 – 2021-07-20 (×9): 12.5 mg via ORAL
  Filled 2021-07-16 (×8): qty 1
  Filled 2021-07-16: qty 4

## 2021-07-16 MED ORDER — DONEPEZIL HCL 10 MG PO TABS
10.0000 mg | ORAL_TABLET | Freq: Every day | ORAL | Status: DC
  Administered 2021-07-16 – 2021-07-19 (×4): 10 mg via ORAL
  Filled 2021-07-16 (×5): qty 1

## 2021-07-16 MED ORDER — IRBESARTAN 75 MG PO TABS
150.0000 mg | ORAL_TABLET | Freq: Every day | ORAL | Status: DC
  Administered 2021-07-16 – 2021-07-20 (×5): 150 mg via ORAL
  Filled 2021-07-16: qty 1
  Filled 2021-07-16 (×3): qty 2
  Filled 2021-07-16: qty 1
  Filled 2021-07-16: qty 2

## 2021-07-16 MED ORDER — INSULIN GLARGINE-YFGN 100 UNIT/ML ~~LOC~~ SOLN: 8.0000 [IU] | Freq: Every day | SUBCUTANEOUS | Status: DC

## 2021-07-16 MED ORDER — ACETAMINOPHEN 325 MG PO TABS
650.0000 mg | ORAL_TABLET | Freq: Four times a day (QID) | ORAL | Status: DC | PRN
  Administered 2021-07-18 – 2021-07-19 (×2): 650 mg via ORAL
  Filled 2021-07-16 (×3): qty 2

## 2021-07-16 MED ORDER — MIRTAZAPINE 15 MG PO TABS
15.0000 mg | ORAL_TABLET | Freq: Every day | ORAL | Status: DC
Start: 2021-07-16 — End: 2021-07-20
  Administered 2021-07-16 – 2021-07-19 (×4): 15 mg via ORAL
  Filled 2021-07-16 (×5): qty 1

## 2021-07-16 MED ORDER — INSULIN ASPART 100 UNIT/ML IJ SOLN
0.0000 [IU] | Freq: Three times a day (TID) | INTRAMUSCULAR | Status: DC
  Administered 2021-07-16 – 2021-07-18 (×5): 2 [IU] via SUBCUTANEOUS
  Administered 2021-07-18: 3 [IU] via SUBCUTANEOUS
  Administered 2021-07-19: 2 [IU] via SUBCUTANEOUS
  Administered 2021-07-19 – 2021-07-20 (×2): 3 [IU] via SUBCUTANEOUS

## 2021-07-16 MED ORDER — SODIUM CHLORIDE 0.9 % IV SOLN
1.0000 g | INTRAVENOUS | Status: DC
  Administered 2021-07-16 – 2021-07-17 (×2): 1 g via INTRAVENOUS
  Filled 2021-07-16 (×2): qty 10

## 2021-07-16 MED ORDER — SODIUM CHLORIDE 0.9 % IV SOLN
INTRAVENOUS | Status: DC | PRN
Start: 2021-07-16 — End: 2021-07-20

## 2021-07-16 MED ORDER — HALOPERIDOL 5 MG PO TABS
10.0000 mg | ORAL_TABLET | Freq: Every day | ORAL | Status: DC
  Administered 2021-07-16 – 2021-07-19 (×4): 10 mg via ORAL
  Filled 2021-07-16 (×5): qty 2

## 2021-07-16 MED ORDER — DICYCLOMINE HCL 20 MG PO TABS: 20.0000 mg | ORAL_TABLET | Freq: Two times a day (BID) | ORAL | Status: DC

## 2021-07-16 NOTE — Progress Notes (Signed)
Patient arrived to the floor via stretcher from ED. VS obtained, tele applied and verified. Patient alert and oriented, daughter and son-in-law at bedside; care plan reviewed with patient and family, verbalized understanding. Call bell within reach, bed alarm on and fall mats in place.  ?

## 2021-07-16 NOTE — Assessment & Plan Note (Addendum)
Stable.  She is fully oriented but not a great historian. ?-Continue home Aricept and Namenda ?-Reorientation and delirium precautions. ?

## 2021-07-16 NOTE — ED Notes (Signed)
Breakfast order placed ?

## 2021-07-16 NOTE — ED Notes (Signed)
Breakfast meal given 

## 2021-07-16 NOTE — Evaluation (Signed)
Occupational Therapy Evaluation ?Patient Details ?Name: [name] ?MRN: [id number] ?DOB: May 25, 1940 ?Today's Date: 07/16/2021 ? ? ?History of Present Illness Pt is an 81 yo female presenting to the ED 07/15/21 with dizziness, N/V, and multiple recent falls. PMH includes dementia, DM2, CHF, schizophrenia, breast CA, HTN, PVD, GERD, CKD, and IBS.  ? ?Clinical Impression ?  ?PTA patient reports using rollator for mobility, some assist from aide (daughter) for ADLs.  Admitted for above and limited by problem list below, including R lateral lean, impaired balance, decreased activity tolerance, dizziness when sitting upright, and generalized weakness.  Patient currently requires max assist for bed mobility and sitting EOB statically with heavy R lateral lean, up to total assist for ADLs.  Dizziness and nausea sitting upright at EOB. Pt will benefit from continued OT services while admitted and after dc at SNF level to optimize independence and return to PLOF.  Will follow acutely.  ?   ? ?Recommendations for follow up therapy are one component of a multi-disciplinary discharge planning process, led by the attending physician.  Recommendations may be updated based on patient status, additional functional criteria and insurance authorization.  ? ?Follow Up Recommendations ? Skilled nursing-short term rehab (<3 hours/day)  ?  ?Assistance Recommended at Discharge Frequent or constant Supervision/Assistance  ?Patient can return home with the following Two people to help with walking and/or transfers;A lot of help with bathing/dressing/bathroom;Assistance with cooking/housework;Direct supervision/assist for medications management;Direct supervision/assist for financial management;Assist for transportation;Help with stairs or ramp for entrance ? ?  ?Functional Status Assessment ? Patient has had a recent decline in their functional status and demonstrates the ability to make significant improvements in function in a reasonable  and predictable amount of time.  ?Equipment Recommendations ? Other (comment) (defer)  ?  ?Recommendations for Other Services   ? ? ?  ?Precautions / Restrictions Precautions ?Precautions: Fall ?Precaution Comments: watch BP ?Restrictions ?Weight Bearing Restrictions: No  ? ?  ? ?Mobility Bed Mobility ?Overal bed mobility: Needs Assistance ?Bed Mobility: Supine to Sit, Sit to Supine ?  ?  ?Supine to sit: Max assist ?Sit to supine: Max assist, +2 for safety/equipment, +2 for physical assistance ?  ?General bed mobility comments: pt required max A for trunk, LE support and scooting.  Returned back to supine with max +2 after sitting EOB due to dizziness ?  ? ?Transfers ?  ?  ?  ?  ?  ?  ?  ?  ?  ?General transfer comment: unable ?  ? ?  ?Balance Overall balance assessment: Needs assistance ?Sitting-balance support: No upper extremity supported, Feet unsupported ?Sitting balance-Leahy Scale: Zero ?Sitting balance - Comments: pt required max A for trunk support in sitting with severe R lateral lean ?Postural control: Right lateral lean ?  ?  ?  ?  ?  ?  ?  ?  ?  ?  ?  ?  ?  ?  ?   ? ?ADL either performed or assessed with clinical judgement  ? ?ADL Overall ADL's : Needs assistance/impaired ?  ?  ?Grooming: Minimal assistance;Sitting ?  ?  ?  ?  ?  ?Upper Body Dressing : Sitting;Moderate assistance ?  ?Lower Body Dressing: Total assistance;+2 for physical assistance;+2 for safety/equipment;Sitting/lateral leans ?  ?  ?  ?  ?  ?  ?  ?  ?General ADL Comments: limited to EOB due to dizziness  ? ? ? ?Vision Baseline Vision/History: 1 Wears glasses ?Ability to See in Adequate Light: 0 Adequate ?  Vision Assessment?: Yes ?Tracking/Visual Pursuits: Decreased smoothness of horizontal tracking (nystagmus with horizontal tracking)  ?   ?Perception   ?  ?Praxis   ?  ? ?Pertinent Vitals/Pain Pain Assessment ?Pain Assessment: No/denies pain  ? ? ? ?Hand Dominance Right ?  ?Extremity/Trunk Assessment Upper Extremity Assessment ?Upper  Extremity Assessment: Generalized weakness ?  ?Lower Extremity Assessment ?Lower Extremity Assessment: Defer to PT evaluation ?RLE Deficits / Details: pt required increased time and effort to flex R knee in hooklying position compared to L. Only small amount of R ankle AROM compared to L ankle ROM with ankle pumps ?  ?Cervical / Trunk Assessment ?Cervical / Trunk Assessment: Kyphotic ?  ?Communication Communication ?Communication: No difficulties ?  ?Cognition Arousal/Alertness: Awake/alert ?Behavior During Therapy: Flat affect ?Overall Cognitive Status: History of cognitive impairments - at baseline ?  ?  ?  ?  ?  ?  ?  ?  ?  ?  ?  ?  ?  ?  ?  ?  ?General Comments: pt has dementia per chart. Pt A+Ox3, unsure of the day in March; follow simple commands with increased time ?  ?  ?General Comments  Pt had nystagmus with smooth pursuit tracking R>L; pt reports that it did not cause any dizziness. ? ?  ?Exercises   ?  ?Shoulder Instructions    ? ? ?Home Living Family/patient expects to be discharged to:: Private residence ?Living Arrangements: Alone ?Available Help at Discharge: Personal care attendant;Family;Available PRN/intermittently ?Type of Home: Independent living facility ?Home Access: Level entry ?  ?  ?Home Layout: One level ?  ?  ?Bathroom Shower/Tub: Tub/shower unit ?  ?Bathroom Toilet: Standard ?  ?  ?Home Equipment: Rollator (4 wheels);Shower seat - built in;BSC/3in1 ?  ?Additional Comments: children check on pt daily, aide (daughter) 3 hours daily ?  ? ?  ?Prior Functioning/Environment Prior Level of Function : Needs assist;History of Falls (last six months) ?  ?  ?  ?  ?  ?  ?Mobility Comments: uses rollator for mobility ?ADLs Comments: aide assist with cooking, some assist with ADLs (bathing, dressing), IADLs (meds in pill packs) ?  ? ?  ?  ?OT Problem List: Decreased strength;Decreased activity tolerance;Impaired balance (sitting and/or standing);Decreased cognition;Decreased safety awareness;Decreased  knowledge of precautions;Decreased knowledge of use of DME or AE;Obesity ?  ?   ?OT Treatment/Interventions: Self-care/ADL training;Therapeutic exercise;DME and/or AE instruction;Therapeutic activities;Balance training;Patient/family education;Cognitive remediation/compensation  ?  ?OT Goals(Current goals can be found in the care plan section) Acute Rehab OT Goals ?Patient Stated Goal: get better ?OT Goal Formulation: With patient ?Time For Goal Achievement: 07/30/21 ?Potential to Achieve Goals: Good  ?OT Frequency: Min 2X/week ?  ? ?Co-evaluation PT/OT/SLP Co-Evaluation/Treatment: Yes ?Reason for Co-Treatment: For patient/therapist safety;To address functional/ADL transfers ?PT goals addressed during session: Mobility/safety with mobility;Balance;Strengthening/ROM ?OT goals addressed during session: ADL's and self-care ?  ? ?  ?AM-PAC OT "6 Clicks" Daily Activity     ?Outcome Measure Help from another person eating meals?: A Little ?Help from another person taking care of personal grooming?: A Little ?Help from another person toileting, which includes using toliet, bedpan, or urinal?: A Lot ?Help from another person bathing (including washing, rinsing, drying)?: A Lot ?Help from another person to put on and taking off regular upper body clothing?: A Little ?Help from another person to put on and taking off regular lower body clothing?: Total ?6 Click Score: 14 ?  ?End of Session Nurse Communication: Mobility status ? ?Activity Tolerance: Patient  tolerated treatment well ?Patient left: in bed;with call bell/phone within reach ? ?OT Visit Diagnosis: Other abnormalities of gait and mobility (R26.89);Muscle weakness (generalized) (M62.81);Dizziness and giddiness (R42)  ?              ?Time: 2878-6767 ?OT Time Calculation (min): 20 min ?Charges:  OT General Charges ?$OT Visit: 1 Visit ?OT Evaluation ?$OT Eval Moderate Complexity: 1 Mod ? ?Jolaine Artist, OT ?Acute Rehabilitation Services ?Pager [phone] ?Office  [phone] ? ? ?Delight Stare ?07/16/2021, 1:54 PM ?

## 2021-07-16 NOTE — Assessment & Plan Note (Addendum)
Due to UTI?    She has history of IBS. Abdominal exam benign. Could have gastroparesis from diabetes although fairly controlled.  Polypharmacy could contribute.  ?-Manage UTI as above. ?-Continue antiemetics as needed ?-Consider adding Bentyl if no improvement in abdominal pain ?-Liberated diet ?

## 2021-07-16 NOTE — ED Provider Notes (Signed)
81 yo F I received in signout from Dr. Gilford Raid, briefly the patient has been not been doing well for the past few days.  She has been fatigued not really eating and drinking well.  Has had nausea vomiting and diarrhea.  She has been describing some dizziness.  Plan for MRI of the brain. ? ?Patient is still very symptomatic on my reassessment.  She is still waiting the MRI of the brain though I feel at this point with her having a urinary tract infection nausea vomiting and diarrhea and persistent fatigue she would benefit from admission to the hospital.  Will discuss with hospitalist. ? ? ?  ?Deno Etienne, DO ?07/16/21 0051 ? ?

## 2021-07-16 NOTE — Assessment & Plan Note (Addendum)
Reportedly had 2 fall incidents in the last 1 week.  Denies hitting head or loss of consciousness.  Could be due to UTI, vertigo, osteoarthritis and a/or polypharmacy. ?-Treat UTI as above. ?-Fall precautions ?-PT/OT eval-recommended SNF but family prefers going back to ALF with therapy. ?

## 2021-07-16 NOTE — ED Notes (Signed)
Pt taken to MRI  

## 2021-07-16 NOTE — Hospital Course (Addendum)
81 year old F with history of dementia, paranoid schizophrenia, CAD, diastolic CHF, IDDM-2, HTN and osteoarthritis brought to ED from ALF/ILF with vertigo, nausea, vomiting, poor p.o. intake and diarrhea, and admitted with acute cystitis.  She had leukocytosis to 15.1.  UA concerning for UTI.  She also had 2 fall incidents at ILF.  CT head, lumbar x-ray and MRI brain without acute finding.  Patient remains on IV ceftriaxone for possible UTI pending urine cultures.  Vertigo persisted. ? ?Therapy recommended SNF but family prefers going back to ALF with therapy. ? ? ?

## 2021-07-16 NOTE — Assessment & Plan Note (Addendum)
She describes her dizziness as spinning.  Again not a great historian. Very subtle horizontal nystagmus on exam.  Otherwise, neuro exam reassuring.  MRI brain without acute finding but moderate chronic small vessel ischemia with multiple chronic lacunar infarcts. ?-PT/OT/vestibular PT. ?-Continue low-dose meclizine. ?? ?

## 2021-07-16 NOTE — Assessment & Plan Note (Signed)
Elderly patient on clonidine, gabapentin, high-dose Remeron ?-Reduce Remeron from 45 to 15 mg daily ?-Discontinued clonidine.  Reportedly not taking ?

## 2021-07-16 NOTE — Progress Notes (Signed)
?PROGRESS NOTE ? ?[name] OMB:[id number] DOB: 18-Dec-1940  ? ?PCP: [name], MD ? ?Patient is from: ALF/ILF ? ?DOA: 07/15/2021 LOS: 0 ? ?Chief complaints ?Chief Complaint  ?Patient presents with  ? Dizziness  ? Frequent Falls  ?  ? ?Brief Narrative / Interim history: ?81 year old F with history of dementia, paranoid schizophrenia, CAD, diastolic CHF, IDDM-2, HTN and osteoarthritis brought to ED from ALF/ILF with vertigo, nausea, vomiting, poor p.o. intake and diarrhea, and admitted with acute cystitis.  She had leukocytosis to 15.1.  UA concerning for UTI.  She also had 2 fall incidents at ILF.  CT head, lumbar x-ray and MRI brain without acute finding.   ? ?Subjective: ?Seen and examined earlier this morning.  Patient's daughter, son-in-law and aide at bedside.  Patient reports severe abdominal pain and nausea.  Pain is across mid abdomen.  Not able to characterize.  No further emesis since yesterday.  She complains dizziness that she describes as spinning.  She reports urinary frequency but not acute.  She denies other UTI symptoms but responds yes to review of system.  She is oriented x4 but not a great historian.  Patient's daughter reports to 4 episodes about 5 days ago. ? ?Objective: ?Vitals:  ? 07/16/21 1115 07/16/21 1130 07/16/21 1215 07/16/21 1315  ?BP: (!) 152/101 (!) 159/72 (!) 153/61 (!) 146/99  ?Pulse: 71 74 73 86  ?Resp: 14   15  ?Temp:      ?TempSrc:      ?SpO2: 99% 97% 98% 99%  ?Weight:      ?Height:      ? ? ?Examination: ? ?GENERAL: No apparent distress.  Nontoxic. ?HEENT: MMM.  Vision and hearing grossly intact.  ?NECK: Supple.  No apparent JVD.  ?RESP:  No IWOB.  Fair aeration bilaterally. ?CVS:  RRR. Heart sounds normal.  ?ABD/GI/GU: BS+. Abd soft, NTND.  ?MSK/EXT:  Moves extremities. No apparent deformity.  Trace BLE edema. ?SKIN: no apparent skin lesion or wound ?NEURO: Awake, alert and oriented appropriately.  Subtle horizontal nystagmus.  No apparent focal neuro deficit other than  symmetric bilateral lower extremity weakness ?PSYCH: Calm. Normal affect.  ? ?Procedures:  ?None ? ?Microbiology summarized: ?Urine culture pending. ?Blood culture pending. ? ?Assessment and Plan: ?* Acute cystitis without hematuria ?Patient is not a great historian.  She responded yes to UTI symptoms on review of systems.  She has leukocytosis.  UA concerning.  ?-Continue IV ceftriaxone pending urine culture ? ?Recurrent falls ?Reportedly had 2 fall incidents in the last 1 week.  Denies hitting head or loss of consciousness.  Could be due to UTI, vertigo, osteoarthritis and a/or polypharmacy. ?-Treat UTI as above. ?-Review and adjust meds ?-Fall precaution ?-PT/OT eval ? ?Nausea, vomiting, diarrhea and abdominal pain ?Due to UTI?  Abdominal exam benign.  She has not had further emesis since last night.  She has history of IBS.  Could have gastroparesis from diabetes although fairly controlled.  Polypharmacy could contribute. ?-Manage UTI as above. ?-Adjust medications ?-Continue antiemetics as needed ?-Consider adding Bentyl if no improvement in abdominal pain ?-Liberate diet ? ?Vertigo ?She describes her dizziness as spinning.  Again not a great historian. Very subtle horizontal nystagmus on exam.  Otherwise, neuro exam reassuring.  MRI brain without acute finding but moderate chronic small vessel ischemia with multiple chronic lacunar infarcts. ?-PT/OT/vestibular PT. ?-Trial of low-dose meclizine. ?  ? ?At risk for polypharmacy ?Elderly patient on clonidine, gabapentin, high-dose Remeron ?-Reduce Remeron from 45 to 15 mg daily ?-Discontinued  clonidine.  Reportedly not taking ? ?Coronary artery disease involving native coronary artery of native heart without angina pectoris ?No cardiopulmonary symptoms. ?-Continue home medications ? ? ?Type 2 diabetes mellitus without complication, with long-term current use of insulin ([place]) ?Controlled with A1c of 5.9%. ?Recent Labs  ?Lab 07/15/21 ?1753 07/16/21 ?0756  07/16/21 ?1111  ?GLUCAP 134* 110* 118*  ?-Continue current insulin regimen ? ? ? ?Chronic diastolic CHF (congestive heart failure) ([place]) ?No cardiopulmonary symptoms.  Appears euvolemic on exam.  Does not seem to be taking Lasix at home. ?-Monitor fluid and respiratory status. ? ? ?Essential hypertension ?Multiple antihypertensive meds listed in the chart.  Per pharmacy, she no longer uses clonidine patch. ?-Continue home Coreg and amlodipine. ?-Add p.o. hydralazine as needed ? ? ?GERD without esophagitis ?Continue PPI ? ? ?Paranoid schizophrenia ([place]) ?Stable.  Continue home Haldol and clozapine ? ?Dementia without behavioral disturbance ([place]) ?Stable.  She is fully oriented but not a great historian. ?-Continue home Aricept and Namenda ?-Reorientation and delirium precautions. ? ? ? ? ?  ? ?Body mass index is 29.88 kg/m?. ?  ?  ?  ?  ?DVT prophylaxis:  ?enoxaparin (LOVENOX) injection 40 mg Start: 07/16/21 1000 ? ?Code Status: Full code ?Family Communication: Updated patient's daughter, son-in-law and caregiver at bedside ?Level of care: Telemetry Medical ?Status is: Observation ?The patient will require care spanning > 2 midnights and should be moved to inpatient because: Acute cystitis requiring IV antibiotics, vertigo, ongoing nausea and abdominal pain ? ? ?Final disposition: SNF ? ?Consultants:  ?None ? ?Sch Meds:  ?Scheduled Meds: ? amLODipine  10 mg Oral Daily  ? aspirin EC  81 mg Oral Daily  ? atorvastatin  40 mg Oral Daily  ? carvedilol  12.5 mg Oral BID WC  ? cloZAPine  150 mg Oral QHS  ? donepezil  10 mg Oral QHS  ? enoxaparin (LOVENOX) injection  40 mg Subcutaneous Q24H  ? fluticasone  2 spray Each Nare Daily  ? haloperidol  10 mg Oral QHS  ? haloperidol  5 mg Oral Daily  ? insulin aspart  0-15 Units Subcutaneous TID AC & HS  ? irbesartan  150 mg Oral Daily  ? loratadine  10 mg Oral Daily  ? LORazepam  1 mg Intravenous Once  ? memantine  10 mg Oral BID  ? mirtazapine  15 mg Oral QHS  ? pantoprazole  40  mg Oral Daily  ? ?Continuous Infusions: ? cefTRIAXone (ROCEPHIN)  IV    ? ?PRN Meds:.acetaminophen **OR** acetaminophen, gabapentin, hydrALAZINE, meclizine, ondansetron **OR** ondansetron (ZOFRAN) IV, polyethylene glycol ? ?Antimicrobials: ?Anti-infectives (From admission, onward)  ? ? Start     Dose/Rate Route Frequency Ordered Stop  ? 07/16/21 2200  cefTRIAXone (ROCEPHIN) 1 g in sodium chloride 0.9 % 100 mL IVPB       ? 1 g ?200 mL/hr over 30 Minutes Intravenous Every 24 hours 07/16/21 0617    ? 07/15/21 2245  cefTRIAXone (ROCEPHIN) 1 g in sodium chloride 0.9 % 100 mL IVPB       ? 1 g ?200 mL/hr over 30 Minutes Intravenous  Once 07/15/21 2237 07/15/21 2328  ? ?  ? ? ? ?I have personally reviewed the following labs and images: ?CBC: ?Recent Labs  ?Lab 07/15/21 ?1732 07/16/21 ?0422  ?WBC 15.1* 12.3*  ?NEUTROABS 11.1* 9.2*  ?HGB 11.0* 10.5*  ?HCT 32.8* 32.8*  ?MCV 93.2 96.5  ?PLT 250 227  ? ?BMP &GFR ?Recent Labs  ?Lab 07/15/21 ?1732 07/16/21 ?0422  ?  NA 144 145  ?K 3.3* 3.5  ?CL 110 116*  ?CO2 25 22  ?GLUCOSE 124* 157*  ?BUN 9 7*  ?CREATININE 0.83 0.86  ?CALCIUM 8.6* 8.1*  ?MG 1.9 1.7  ? ?Estimated Creatinine Clearance: 45.4 mL/min (by C-G formula based on SCr of 0.86 mg/dL). ?Liver & Pancreas: ?Recent Labs  ?Lab 07/15/21 ?1732 07/16/21 ?0422  ?AST 27 29  ?ALT 21 23  ?ALKPHOS 85 74  ?BILITOT 0.5 0.4  ?PROT 5.7* 5.2*  ?ALBUMIN 2.3* 2.0*  ? ?No results for input(s): LIPASE, AMYLASE in the last 168 hours. ?No results for input(s): AMMONIA in the last 168 hours. ?Diabetic: ?Recent Labs  ?  07/16/21 ?0422  ?HGBA1C 5.9*  ? ?Recent Labs  ?Lab 07/15/21 ?1753 07/16/21 ?0756 07/16/21 ?1111  ?GLUCAP 134* 110* 118*  ? ?Cardiac Enzymes: ?No results for input(s): CKTOTAL, CKMB, CKMBINDEX, TROPONINI in the last 168 hours. ?No results for input(s): PROBNP in the last 8760 hours. ?Coagulation Profile: ?No results for input(s): INR, PROTIME in the last 168 hours. ?Thyroid Function Tests: ?No results for input(s): TSH, T4TOTAL, FREET4,  T3FREE, THYROIDAB in the last 72 hours. ?Lipid Profile: ?No results for input(s): CHOL, HDL, LDLCALC, TRIG, CHOLHDL, LDLDIRECT in the last 72 hours. ?Anemia Panel: ?No results for input(s): VITAMINB12, FOLATE, F

## 2021-07-16 NOTE — Evaluation (Signed)
Physical Therapy Evaluation ?Patient Details ?Name: [name] ?MRN: [id number] ?DOB: 08/07/1940 ?Today's Date: 07/16/2021 ? ?History of Present Illness ? Pt is an 81 yo female presenting to the ED 07/15/21 with dizziness, N/V, and multiple recent falls. PMH includes dementia, DM2, CHF, schizophrenia, breast CA, HTN, PVD, GERD, CKD, and IBS. ?  ?Clinical Impression ? Patient presented to the ED on 07/15/21 with dizziness, N/V, and multiple recent falls consistent with patient's diagnosis of a UTI. Pt's impairments include decreased activity tolerance, mobility, strength, range of motion, and increased dizziness with mobility. These impairments are limiting her ability to safely and independently transfer and ambulate. Patient requires max A for sitting EOB and max Ax2 to return to supine. Pt was dizzy and nauseated, which limited mobility, once sitting at EOB with severe R lateral lean and no carryover with multimodal cues to use RUE to support herself in sitting. Pt had nystagmus with smooth pursuits R>L. SPT recommending SNF upon D/C due to mobility deficits. PT will continue to follow acutely to maximize pt's safety and independence with functional mobility.  ?   ?   ? ?Recommendations for follow up therapy are one component of a multi-disciplinary discharge planning process, led by the attending physician.  Recommendations may be updated based on patient status, additional functional criteria and insurance authorization. ? ?Follow Up Recommendations Skilled nursing-short term rehab (<3 hours/day) ? ?  ?Assistance Recommended at Discharge Frequent or constant Supervision/Assistance  ?Patient can return home with the following ? Two people to help with walking and/or transfers;Two people to help with bathing/dressing/bathroom;Assistance with cooking/housework;Direct supervision/assist for medications management;Direct supervision/assist for financial management;Assist for transportation;Help with stairs or ramp for  entrance ? ?  ?Equipment Recommendations Wheelchair (measurements PT);Wheelchair cushion (measurements PT)  ?Recommendations for Other Services ?    ?  ?Functional Status Assessment Patient has had a recent decline in their functional status and demonstrates the ability to make significant improvements in function in a reasonable and predictable amount of time.  ? ?  ?Precautions / Restrictions Precautions ?Precautions: Fall ?Precaution Comments: High BP ?Restrictions ?Weight Bearing Restrictions: No  ? ?  ? ?Mobility ? Bed Mobility ?Overal bed mobility: Needs Assistance ?Bed Mobility: Supine to Sit ?  ?  ?Supine to sit: Max assist ?  ?  ?General bed mobility comments: pt required max A for trunk elevation and LE management to EOB. Pt needed max A to scoot L hip to EOB. Pt initially offered to use SPT's arm to pull herself up to sit, however, pt gave little to no effort with her UE. Pt with very heavy R lateral lean in sitting with no UE support, even with multimodal cues. Pt became nauseated with increased dizziness so further mobility deferred at this time. Systolic BP was 294TMLY. Pt required max Ax2 from sit to supine. Dizziness dissipated once supine. ?  ? ?Transfers ?  ?  ?  ?  ?  ?  ?  ?  ?  ?  ?  ? ?Ambulation/Gait ?  ?  ?  ?  ?  ?  ?  ?  ? ?Stairs ?  ?  ?  ?  ?  ? ?Wheelchair Mobility ?  ? ?Modified Rankin (Stroke Patients Only) ?  ? ?  ? ?Balance Overall balance assessment: Needs assistance ?Sitting-balance support: No upper extremity supported, Feet unsupported ?Sitting balance-Leahy Scale: Zero ?Sitting balance - Comments: pt required max A for trunk support in sitting with severe R lateral lean ?Postural control: Right lateral  lean ?  ?  ?  ?  ?  ?  ?  ?  ?  ?  ?  ?  ?  ?  ?   ? ? ? ?Pertinent Vitals/Pain Pain Assessment ?Pain Assessment: No/denies pain  ? ? ?Home Living Family/patient expects to be discharged to:: Private residence ?Living Arrangements: Alone ?Available Help at Discharge: Personal care  attendant;Family;Available PRN/intermittently ?Type of Home: Independent living facility ?Home Access: Level entry ?  ?  ?  ?Home Layout: One level ?Home Equipment: Rollator (4 wheels);Shower seat - built in;BSC/3in1 ?Additional Comments: children check on pt daily, aide (daughter) 3 hours daily  ?  ?Prior Function Prior Level of Function : Needs assist;History of Falls (last six months) ?  ?  ?  ?  ?  ?  ?Mobility Comments: uses rollator for mobility ?ADLs Comments: aide assist with cooking, some assist with ADLs (bathing, dressing), IADLs (meds in pill packs) ?  ? ? ?Hand Dominance  ? Dominant Hand: Right ? ?  ?Extremity/Trunk Assessment  ? Upper Extremity Assessment ?Upper Extremity Assessment: Defer to OT evaluation ?  ? ?Lower Extremity Assessment ?Lower Extremity Assessment: Generalized weakness;RLE deficits/detail ?RLE Deficits / Details: pt required increased time and effort to flex R knee in hooklying position compared to L. Only small amount of R ankle AROM compared to L ankle ROM with ankle pumps ?  ? ?Cervical / Trunk Assessment ?Cervical / Trunk Assessment: Kyphotic  ?Communication  ? Communication: No difficulties  ?Cognition Arousal/Alertness: Awake/alert ?Behavior During Therapy: Flat affect ?Overall Cognitive Status: History of cognitive impairments - at baseline ?  ?  ?  ?  ?  ?  ?  ?  ?  ?  ?  ?  ?  ?  ?  ?  ?  ?  ?  ? ?  ?General Comments General comments (skin integrity, edema, etc.): Pt had nystagmus with smooth pursuit tracking R>L; pt reports that it did not cause any dizziness. ? ?  ?Exercises    ? ?Assessment/Plan  ?  ?PT Assessment Patient needs continued PT services  ?PT Problem List Decreased strength;Decreased range of motion;Decreased activity tolerance;Decreased balance;Decreased mobility;Decreased coordination ? ?   ?  ?PT Treatment Interventions DME instruction;Gait training;Functional mobility training;Therapeutic activities;Therapeutic exercise;Balance training;Neuromuscular  re-education;Patient/family education   ? ?PT Goals (Current goals can be found in the Care Plan section)  ?Acute Rehab PT Goals ?Patient Stated Goal: to go home ?PT Goal Formulation: With patient ?Time For Goal Achievement: 07/30/21 ?Potential to Achieve Goals: Good ? ?  ?Frequency Min 2X/week ?  ? ? ?Co-evaluation PT/OT/SLP Co-Evaluation/Treatment: Yes ?Reason for Co-Treatment: Complexity of the patient's impairments (multi-system involvement);For patient/therapist safety;To address functional/ADL transfers ?PT goals addressed during session: Mobility/safety with mobility;Balance;Strengthening/ROM ?  ?  ? ? ?  ?AM-PAC PT "6 Clicks" Mobility  ?Outcome Measure Help needed turning from your back to your side while in a flat bed without using bedrails?: A Lot ?Help needed moving from lying on your back to sitting on the side of a flat bed without using bedrails?: Total ?Help needed moving to and from a bed to a chair (including a wheelchair)?: Total ?Help needed standing up from a chair using your arms (e.g., wheelchair or bedside chair)?: Total ?Help needed to walk in hospital room?: Total ?Help needed climbing 3-5 steps with a railing? : Total ?6 Click Score: 7 ? ?  ?End of Session   ?Activity Tolerance: Treatment limited secondary to medical complications (Comment) (nausea and dizziness) ?  Patient left: in bed;with call bell/phone within reach;Other (comment) (on stretcher in ED) ?Nurse Communication: Mobility status ?PT Visit Diagnosis: Other abnormalities of gait and mobility (R26.89);Muscle weakness (generalized) (M62.81);History of falling (Z91.81);Other symptoms and signs involving the nervous system (R29.898);Dizziness and giddiness (R42) ?  ? ?Time: 1561-5379 ?PT Time Calculation (min) (ACUTE ONLY): 19 min ? ? ?Charges:   PT Evaluation ?$PT Eval Moderate Complexity: 1 Mod ?  ?  ?   ? ? ?Jonne Ply, SPT ? ?Jonne Ply ?07/16/2021, 1:12 PM ? ?

## 2021-07-16 NOTE — Assessment & Plan Note (Addendum)
Continue PPI ?

## 2021-07-16 NOTE — Assessment & Plan Note (Addendum)
SBP ranges from 130s to 160s.  Multiple antihypertensive meds listed in the chart.  Per pharmacy, she no longer uses clonidine patch. ?-Continue home Coreg, amlodipine and Avapro. ?-Consider increasing Avapro if SBP remains elevated ?-Add p.o. hydralazine as needed ? ?

## 2021-07-16 NOTE — Assessment & Plan Note (Addendum)
Patient is not a great historian.  She responded yes to UTI symptoms on review of systems.  She has leukocytosis.  UA concerning.  ?-Continue IV ceftriaxone pending urine culture ?

## 2021-07-16 NOTE — Assessment & Plan Note (Addendum)
No cardiopulmonary symptoms. ?-Continue home medications ? ?

## 2021-07-16 NOTE — Assessment & Plan Note (Addendum)
Stable.  Continue home Haldol and clozapine ?

## 2021-07-16 NOTE — Assessment & Plan Note (Addendum)
Controlled with A1c of 5.9%. ?Recent Labs  ?Lab 07/16/21 ?1710 07/16/21 ?2108 07/17/21 ?0754 07/17/21 ?1142 07/17/21 ?5038  ?GLUCAP 148* 100* 78 129* 123*  ?-Continue current insulin regimen ? ? ?

## 2021-07-16 NOTE — Assessment & Plan Note (Addendum)
No cardiopulmonary symptoms.  Appears euvolemic on exam.  Does not seem to be taking Lasix at home. ?-Monitor fluid and respiratory status. ? ?

## 2021-07-16 NOTE — H&P (Signed)
?History and Physical  ? ? ?Patient: [name] MRN: [id number] DOA: 07/15/2021 ? ?Date of Service: the patient was seen and examined on 07/16/2021 ? ?Patient coming from: ALF/ILF ? ?Chief Complaint:  ?Chief Complaint  ?Patient presents with  ? Dizziness  ? Frequent Falls  ? ? ?HPI:  ? ?81 year old female with past medical history of dementia, paranoid schizophrenia, hypertension, coronary artery disease, diastolic congestive heart failure (Echo 10/2017 EF 65-70% with G1DD), insulin-dependent diabetes mellitus type 2 who presents to [facility] emergency department via EMS from [place] independent living with complaints of vertigo, nausea and vomiting. ? ?Patient is a somewhat poor historian therefore portion of the history of and also been obtained from the daughter.  Approximately 3 or 4 days ago patient began to experience nausea and episodes of vomiting.  Patient denies any associated diarrhea.  There has been no associated abdominal pain, diarrhea, fever, recent travel or sick contacts. ? ?Patient has also been experiencing associated "dizziness" and generalized weakness over the same span of time.  As the days progressed patient has exhibited a poor appetite.  Due to persisting symptoms EMS was eventually contacted and patient was promptly brought into [facility] emergency department for evaluation. ? ?Upon evaluation in the emergency department patient was found to have a substantial leukocytosis of 15.1.  Urinalysis was found to be highly suggestive of urinary tract infection.  ER provider felt that patient's symptoms are secondary to a progressive urinary tract infection and placed the patient on intravenous fluids as well as intravenous ceftriaxone.  The hospitalist group was then called to assess the patient for admission to the hospital. ? ? ? ?Review of Systems: Review of Systems  ?Gastrointestinal:  Positive for nausea and vomiting.  ?Musculoskeletal:  Positive for  falls.  ?Neurological:  Positive for weakness.  ?All other systems reviewed and are negative. ? ? ?Past Medical History:  ?Diagnosis Date  ? Allergic rhinitis   ? Anemia, iron deficiency   ? Anxiety   ? Arthritis   ? back  ? CAD (coronary artery disease)   ? CAD stent placed 2005  ? Cancer [facility])   ? Left breast invasive ductal carcinoma  ? Dementia ([place])   ? Esophageal motility disorder   ? GERD (gastroesophageal reflux disease)   ? HTN (hypertension)   ? Hyperlipidemia   ? IBS (irritable bowel syndrome)   ? Peripheral neuropathy   ? Pneumonia 12-09-2014  ? adm with CAP requiring mechanical ventilation x 10d  ? PVD (peripheral vascular disease) ([place])   ? Left subclavian 70% stenosis  ? Renal insufficiency   ? Schizophrenia ([place])   ? paranoid schizophrenic  ? Syncope   ? EP study 1996 w/atrial tachycardia  ? Type II or unspecified type diabetes mellitus without mention of complication, not stated as uncontrolled   ? ? ?Past Surgical History:  ?Procedure Laterality Date  ? ABDOMINAL HYSTERECTOMY    ? BREAST BIOPSY    ? Negative  ? ESOPHAGOGASTRODUODENOSCOPY N/A 11/02/2015  ? Procedure: ESOPHAGOGASTRODUODENOSCOPY (EGD);  Surgeon: Laurence Spates, MD;  Location: [facility];  Service: Endoscopy;  Laterality: N/A;  ? JOINT REPLACEMENT Bilateral   ? KNEE ARTHROPLASTY    ? Right   ? PTCA  1995  ? RADIOACTIVE SEED GUIDED PARTIAL MASTECTOMY WITH AXILLARY SENTINEL LYMPH NODE BIOPSY Left 08/17/2015  ? Procedure: RADIOACTIVE SEED GUIDED LUMPECTOMY WITH AXILLARY SENTINEL LYMPH NODE BIOPSY;  Surgeon: Donnie Mesa, MD;  Location: [place] [facility];  Service: General;  Laterality: Left;  ? RE-EXCISION OF BREAST LUMPECTOMY Left 09/07/2015  ? Procedure: RE-EXCISION OF LEFT BREAST LUMPECTOMY;  Surgeon: Donnie Mesa, MD;  Location: [place];  Service: General;  Laterality: Left;  ? ? ?Social History:  reports that she has quit smoking. She has never used smokeless tobacco. She reports that she does not drink  alcohol and does not use drugs. ? ?Allergies  ?Allergen Reactions  ? Codeine Phosphate Other (See Comments)  ?  Unknown reaction  ? Dexilant [Dexlansoprazole] Other (See Comments)  ?  weakness  ? Glimepiride Other (See Comments)  ?  Unknown reaction  ? Glyburide Other (See Comments)  ?  Unknown reaction  ? Levofloxacin Nausea Only  ?  Made her weak and nauseated  ? Shellfish Allergy Swelling  ?  Lip swelling  ? Tramadol Other (See Comments)  ?  Unknown reaction  ? ? ?Family History  ?Problem Relation Age of Onset  ? Hypertension Other   ? Diabetes Other   ? Schizophrenia Neg Hx   ? Coronary artery disease Neg Hx   ? Colon cancer Neg Hx   ? Breast cancer Neg Hx   ? ? ?Prior to Admission medications   ?Medication Sig Start Date End Date Taking? Authorizing Provider  ?ACETAMINOPHEN-PAMABROM PO Take 1 tablet by mouth 2 (two) times daily. "Back Aid"    [provider]  ?amLODipine (NORVASC) 10 MG tablet Take 10 mg by mouth daily.     [provider]  ?atorvastatin (LIPITOR) 40 MG tablet Take 40 mg by mouth daily.    [provider]  ?carvedilol (COREG) 12.5 MG tablet Take 1 tablet (12.5 mg total) by mouth 2 (two) times daily with a meal. 11/21/17   Danford, Suann Larry, MD  ?Chlorphen-Phenyleph-ASA (ALKA-SELTZER PLUS COLD PO) Take 1 tablet by mouth 2 (two) times daily.    [provider]  ?cloNIDine (CATAPRES - DOSED IN MG/24 HR) 0.2 mg/24hr patch Place 1 patch (0.2 mg total) onto the skin every Monday at 6 PM. 11/24/17   Danford, Suann Larry, MD  ?cloZAPine (CLOZARIL) 100 MG tablet Take 2 tablets (200 mg total) by mouth at bedtime. 11/27/17   Eugenie Filler, MD  ?dicyclomine (BENTYL) 20 MG tablet Take 1 tablet (20 mg total) by mouth 2 (two) times daily. 06/05/18   Margarita Mail, PA-C  ?donepezil (ARICEPT) 10 MG tablet Take 10 mg by mouth at bedtime.    [provider]  ?esomeprazole (NEXIUM) 40 MG capsule Take 40 mg by mouth 2 (two) times daily.    [provider]   ?feeding supplement, GLUCERNA SHAKE, (GLUCERNA SHAKE) LIQD Take 237 mLs by mouth 3 (three) times daily between meals. ?Patient not taking: Reported on 11/16/2017 11/09/15   Murlean Iba, MD  ?fluticasone (FLONASE) 50 MCG/ACT nasal spray Place 2 sprays into both nostrils daily. 11/28/17   Eugenie Filler, MD  ?furosemide (LASIX) 40 MG tablet Take 40 mg by mouth daily.     [provider]  ?gabapentin (NEURONTIN) 100 MG capsule Take 100 mg by mouth at bedtime as needed (pain).    [provider]  ?haloperidol (HALDOL) 5 MG tablet Take 5-10 mg by mouth See admin instructions. Take one tablet (5 mg) by mouth every morning and two tablets (10 mg) at bedtime as tolerated    [provider]  ?HYDROcodone-acetaminophen (NORCO/VICODIN) 5-325 MG tablet Take 1-2 tablets by mouth every 6 (six) hours as needed for moderate pain. 11/27/17   [place] Silos,  Malachy Moan, MD  ?insulin aspart (NOVOLOG) 100 UNIT/ML injection Inject 3 Units into the skin 3 (three) times daily with meals. 11/27/17   Eugenie Filler, MD  ?insulin glargine (LANTUS) 100 UNIT/ML injection Inject 0.08 mLs (8 Units total) into the skin at bedtime. 11/27/17   Eugenie Filler, MD  ?loratadine (CLARITIN) 10 MG tablet Take 1 tablet (10 mg total) by mouth daily. 11/28/17   Eugenie Filler, MD  ?memantine (NAMENDA) 10 MG tablet Take 1 tablet (10 mg total) by mouth 2 (two) times daily. 10/09/12   Norins, Heinz Knuckles, MD  ?mirtazapine (REMERON) 45 MG tablet Take 45 mg by mouth at bedtime.     [provider]  ?potassium chloride (KLOR-CON) 8 MEQ tablet Take 1 tablet (8 mEq total) by mouth daily. ?Patient taking differently: Take 16 mEq by mouth daily.  04/28/12   Norins, Heinz Knuckles, MD  ?ranitidine (ZANTAC) 150 MG capsule Take 150 mg by mouth 2 (two) times daily.    [provider]  ?senna-docusate (SENOKOT-S) 8.6-50 MG tablet Take 1 tablet by mouth at bedtime as needed for mild constipation. 11/27/17   Eugenie Filler, MD   ?valsartan (DIOVAN) 160 MG tablet Take 160 mg by mouth daily.    [provider]  ? ? ?Physical Exam: ? ?Vitals:  ? 07/15/21 2234 07/15/21 2300 07/16/21 0000 07/16/21 0415  ?BP:  (!) 161/82 (!) 159/79 139

## 2021-07-17 DIAGNOSIS — I5032 Chronic diastolic (congestive) heart failure: Secondary | ICD-10-CM | POA: Diagnosis not present

## 2021-07-17 DIAGNOSIS — N3000 Acute cystitis without hematuria: Secondary | ICD-10-CM | POA: Diagnosis not present

## 2021-07-17 DIAGNOSIS — I2510 Atherosclerotic heart disease of native coronary artery without angina pectoris: Secondary | ICD-10-CM | POA: Diagnosis not present

## 2021-07-17 DIAGNOSIS — R42 Dizziness and giddiness: Secondary | ICD-10-CM | POA: Diagnosis not present

## 2021-07-17 LAB — GLUCOSE, CAPILLARY
Glucose-Capillary: 78 mg/dL (ref 70–99)
Glucose-Capillary: 129 mg/dL — ABNORMAL HIGH (ref 70–99)
Glucose-Capillary: 123 mg/dL — ABNORMAL HIGH (ref 70–99)
Glucose-Capillary: 178 mg/dL — ABNORMAL HIGH (ref 70–99)

## 2021-07-17 LAB — RENAL FUNCTION PANEL
Albumin: 1.8 g/dL — ABNORMAL LOW (ref 3.5–5.0)
Anion gap: 6 (ref 5–15)
BUN: <5 mg/dL — ABNORMAL LOW (ref 8–23)
CO2: 22 mmol/L (ref 22–32)
Calcium: 8.3 mg/dL — ABNORMAL LOW (ref 8.9–10.3)
Chloride: 116 mmol/L — ABNORMAL HIGH (ref 98–111)
Creatinine, Ser: 1.02 mg/dL — ABNORMAL HIGH (ref 0.44–1.00)
GFR, Estimated: 56 mL/min — ABNORMAL LOW (ref >60)
Glucose, Bld: 88 mg/dL (ref 70–99)
Phosphorus: 2.6 mg/dL (ref 2.5–4.6)
Potassium: 3.4 mmol/L — ABNORMAL LOW (ref 3.5–5.1)
Sodium: 144 mmol/L (ref 135–145)

## 2021-07-17 LAB — CBC
HCT: 29.8 % — ABNORMAL LOW (ref 36.0–46.0)
Hemoglobin: 9.4 g/dL — ABNORMAL LOW (ref 12.0–15.0)
MCH: 30.1 pg (ref 26.0–34.0)
MCHC: 31.5 g/dL (ref 30.0–36.0)
MCV: 95.5 fL (ref 80.0–100.0)
Platelets: 234 10*3/uL (ref 150–400)
RBC: 3.12 MIL/uL — ABNORMAL LOW (ref 3.87–5.11)
RDW: 15.9 % — ABNORMAL HIGH (ref 11.5–15.5)
WBC: 10.2 10*3/uL (ref 4.0–10.5)
nRBC: 0 % (ref 0.0–0.2)

## 2021-07-17 LAB — MAGNESIUM: Magnesium: 1.7 mg/dL (ref 1.7–2.4)

## 2021-07-17 LAB — CK: Total CK: 158 U/L (ref 38–234)

## 2021-07-17 MED ORDER — POTASSIUM CHLORIDE CRYS ER 20 MEQ PO TBCR
40.0000 meq | EXTENDED_RELEASE_TABLET | ORAL | Status: AC
  Administered 2021-07-17 (×2): 40 meq via ORAL
  Filled 2021-07-17 (×2): qty 2

## 2021-07-17 MED ORDER — HYDRALAZINE HCL 25 MG PO TABS: 25.0000 mg | ORAL_TABLET | Freq: Four times a day (QID) | ORAL | Status: DC | PRN

## 2021-07-17 MED ORDER — MAGNESIUM SULFATE 2 GM/50ML IV SOLN
2.0000 g | Freq: Once | INTRAVENOUS | Status: AC
  Administered 2021-07-17: 2 g via INTRAVENOUS
  Filled 2021-07-17: qty 50

## 2021-07-17 NOTE — Progress Notes (Signed)
?PROGRESS NOTE ? ?[name] NOB:[id number] DOB: [birth year]  ? ?PCP: [name], MD ? ?Patient is from: ALF/ILF ? ?DOA: 07/15/2021 LOS: 1 ? ?Chief complaints ?Chief Complaint  ?Patient presents with  ? Dizziness  ? Frequent Falls  ?  ? ?Brief Narrative / Interim history: ?81 year old F with history of dementia, paranoid schizophrenia, CAD, diastolic CHF, IDDM-2, HTN and osteoarthritis brought to ED from ALF/ILF with vertigo, nausea, vomiting, poor p.o. intake and diarrhea, and admitted with acute cystitis.  She had leukocytosis to 15.1.  UA concerning for UTI.  She also had 2 fall incidents at ILF.  CT head, lumbar x-ray and MRI brain without acute finding.  Patient remains on IV ceftriaxone for possible UTI pending urine cultures.  Vertigo persisted. ? ?Therapy recommended SNF but family prefers going back to ALF with therapy. ? ?  ? ?Subjective: ?Seen and examined earlier this morning.  No major events overnight of this morning.  Continues to endorse dizziness that she describes as spinning.  Also complains abdominal pain.  Reportedly had a good bowel movement this morning.  Patient's daughters at bedside. ? ?Objective: ?Vitals:  ? 07/17/21 0104 07/17/21 0432 07/17/21 9476 07/17/21 1705  ?BP: (!) 141/49 (!) 135/54 (!) 160/68 (!) 165/70  ?Pulse: 78 76 95 86  ?Resp: '16 16 16 18  '$ ?Temp: (!) 97.5 ?F (36.4 ?C) 98 ?F (36.7 ?C) 98.5 ?F (36.9 ?C) 98.8 ?F (37.1 ?C)  ?TempSrc: Oral Oral Oral Oral  ?SpO2: 96% 97% 98% 100%  ?Weight:      ?Height:      ? ? ?Examination: ? ?GENERAL: No apparent distress.  Nontoxic. ?HEENT: MMM.  Vision and hearing grossly intact.  ?NECK: Supple.  No apparent JVD.  ?RESP:  No IWOB.  Fair aeration bilaterally. ?CVS:  RRR. Heart sounds normal.  ?ABD/GI/GU: BS+. Abd soft..  Mild discomfort with deep palpation. ?MSK/EXT:  Moves extremities. No apparent deformity. No edema.  ?SKIN: no apparent skin lesion or wound ?NEURO: Awake and alert. Oriented appropriately.  Very subtle horizontal  nystagmus.  No apparent focal neuro deficit other than symmetric BLE weakness. ?PSYCH: Calm. Normal affect.  ? ?Procedures:  ?None ? ?Microbiology summarized: ?Urine culture pending. ?Blood culture NGTD. ? ?Assessment and Plan: ?* Acute cystitis without hematuria ?Patient is not a great historian.  She responded yes to UTI symptoms on review of systems.  She has leukocytosis.  UA concerning.  ?-Continue IV ceftriaxone pending urine culture ? ?Recurrent falls ?Reportedly had 2 fall incidents in the last 1 week.  Denies hitting head or loss of consciousness.  Could be due to UTI, vertigo, osteoarthritis and a/or polypharmacy. ?-Treat UTI as above. ?-Fall precautions ?-PT/OT eval-recommended SNF but family prefers going back to ALF with therapy. ? ?Nausea, vomiting, diarrhea and abdominal pain ?Due to UTI?    She has history of IBS. Abdominal exam benign. Could have gastroparesis from diabetes although fairly controlled.  Polypharmacy could contribute.  ?-Manage UTI as above. ?-Continue antiemetics as needed ?-Consider adding Bentyl if no improvement in abdominal pain ?-Liberated diet ? ?Vertigo ?She describes her dizziness as spinning.  Again not a great historian. Very subtle horizontal nystagmus on exam.  Otherwise, neuro exam reassuring.  MRI brain without acute finding but moderate chronic small vessel ischemia with multiple chronic lacunar infarcts. ?-PT/OT/vestibular PT. ?-Continue low-dose meclizine. ?  ? ?At risk for polypharmacy ?Elderly patient on clonidine, gabapentin, high-dose Remeron ?-Reduce Remeron from 45 to 15 mg daily ?-Discontinued clonidine.  Reportedly not taking ? ?Coronary artery disease  involving native coronary artery of native heart without angina pectoris ?No cardiopulmonary symptoms. ?-Continue home medications ? ? ?Type 2 diabetes mellitus without complication, with long-term current use of insulin ([place]) ?Controlled with A1c of 5.9%. ?Recent Labs  ?Lab 07/16/21 ?1710 07/16/21 ?2108  07/17/21 ?0754 07/17/21 ?1142 07/17/21 ?6270  ?GLUCAP 148* 100* 78 129* 123*  ?-Continue current insulin regimen ? ? ? ?Chronic diastolic CHF (congestive heart failure) ([place]) ?No cardiopulmonary symptoms.  Appears euvolemic on exam.  Does not seem to be taking Lasix at home. ?-Monitor fluid and respiratory status. ? ? ?Essential hypertension ?SBP ranges from 130s to 160s.  Multiple antihypertensive meds listed in the chart.  Per pharmacy, she no longer uses clonidine patch. ?-Continue home Coreg, amlodipine and Avapro. ?-Consider increasing Avapro if SBP remains elevated ?-Add p.o. hydralazine as needed ? ? ?GERD without esophagitis ?Continue PPI ? ? ?Paranoid schizophrenia ([place]) ?Stable.  Continue home Haldol and clozapine ? ?Dementia without behavioral disturbance ([place]) ?Stable.  She is fully oriented but not a great historian. ?-Continue home Aricept and Namenda ?-Reorientation and delirium precautions. ? ? ? ? ?DVT prophylaxis:  ?enoxaparin (LOVENOX) injection 40 mg Start: 07/16/21 1000 ? ?Code Status: Full code ?Family Communication: Updated patient's daughters at bedside. ?Level of care: Telemetry Medical ?Status is: Inpatient ?The patient will remain inpatient because: Acute cystitis requiring IV antibiotics and ongoing vertigo ? ? ?Final disposition: Back to ALF with therapy per patient and family wish ? ?Consultants:  ?None ? ?Sch Meds:  ?Scheduled Meds: ? amLODipine  10 mg Oral Daily  ? aspirin EC  81 mg Oral Daily  ? atorvastatin  40 mg Oral Daily  ? carvedilol  12.5 mg Oral BID WC  ? cloZAPine  150 mg Oral QHS  ? donepezil  10 mg Oral QHS  ? enoxaparin (LOVENOX) injection  40 mg Subcutaneous Q24H  ? fluticasone  2 spray Each Nare Daily  ? haloperidol  10 mg Oral QHS  ? haloperidol  5 mg Oral Daily  ? insulin aspart  0-15 Units Subcutaneous TID AC & HS  ? irbesartan  150 mg Oral Daily  ? loratadine  10 mg Oral Daily  ? LORazepam  1 mg Intravenous Once  ? memantine  10 mg Oral BID  ? mirtazapine  15 mg  Oral QHS  ? pantoprazole  40 mg Oral Daily  ? ?Continuous Infusions: ? sodium chloride Stopped (07/16/21 2331)  ? cefTRIAXone (ROCEPHIN)  IV Stopped (07/16/21 2331)  ? ?PRN Meds:.sodium chloride, acetaminophen **OR** acetaminophen, gabapentin, hydrALAZINE, meclizine, ondansetron **OR** ondansetron (ZOFRAN) IV, polyethylene glycol ? ?Antimicrobials: ?Anti-infectives (From admission, onward)  ? ? Start     Dose/Rate Route Frequency Ordered Stop  ? 07/16/21 2200  cefTRIAXone (ROCEPHIN) 1 g in sodium chloride 0.9 % 100 mL IVPB       ? 1 g ?200 mL/hr over 30 Minutes Intravenous Every 24 hours 07/16/21 0617    ? 07/15/21 2245  cefTRIAXone (ROCEPHIN) 1 g in sodium chloride 0.9 % 100 mL IVPB       ? 1 g ?200 mL/hr over 30 Minutes Intravenous  Once 07/15/21 2237 07/15/21 2328  ? ?  ? ? ? ?I have personally reviewed the following labs and images: ?CBC: ?Recent Labs  ?Lab 07/15/21 ?1732 07/16/21 ?0422 07/17/21 ?3500  ?WBC 15.1* 12.3* 10.2  ?NEUTROABS 11.1* 9.2*  --   ?HGB 11.0* 10.5* 9.4*  ?HCT 32.8* 32.8* 29.8*  ?MCV 93.2 96.5 95.5  ?PLT 250 227 234  ? ?BMP &GFR ?  Recent Labs  ?Lab 07/15/21 ?1732 07/16/21 ?0422 07/17/21 ?5056  ?NA 144 145 144  ?K 3.3* 3.5 3.4*  ?CL 110 116* 116*  ?CO2 '25 22 22  '$ ?GLUCOSE 124* 157* 88  ?BUN 9 7* <5*  ?CREATININE 0.83 0.86 1.02*  ?CALCIUM 8.6* 8.1* 8.3*  ?MG 1.9 1.7 1.7  ?PHOS  --   --  2.6  ? ?Estimated Creatinine Clearance: 37.2 mL/min (A) (by C-G formula based on SCr of 1.02 mg/dL (H)). ?Liver & Pancreas: ?Recent Labs  ?Lab 07/15/21 ?1732 07/16/21 ?0422 07/17/21 ?9794  ?AST 27 29  --   ?ALT 21 23  --   ?ALKPHOS 85 74  --   ?BILITOT 0.5 0.4  --   ?PROT 5.7* 5.2*  --   ?ALBUMIN 2.3* 2.0* 1.8*  ? ?No results for input(s): LIPASE, AMYLASE in the last 168 hours. ?No results for input(s): AMMONIA in the last 168 hours. ?Diabetic: ?Recent Labs  ?  07/16/21 ?0422  ?HGBA1C 5.9*  ? ?Recent Labs  ?Lab 07/16/21 ?1710 07/16/21 ?2108 07/17/21 ?0754 07/17/21 ?1142 07/17/21 ?8016  ?GLUCAP 148* 100* 78 129*  123*  ? ?Cardiac Enzymes: ?Recent Labs  ?Lab 07/17/21 ?5537  ?CKTOTAL 158  ? ?No results for input(s): PROBNP in the last 8760 hours. ?Coagulation Profile: ?No results for input(s): INR, PROTIME in the last 168 hours. ?

## 2021-07-17 NOTE — TOC Progression Note (Signed)
Transition of Care (TOC) - Initial/Assessment Note  ? ? ?Patient Details  ?Name: [name] ?MRN: [id number] ?Date of Birth: July 24, 1940 ? ?Transition of Care (TOC) CM/SW Contact:    ?Paulene Floor Mieka Leaton, LCSWA ?Phone Number: ?07/17/2021, 4:09 PM ? ?Clinical Narrative:                 ?CSW contacted [facility] [phone]).  There was no answer.  CSW left a secure VM requesting a returned call to discuss the patient's PT needs at discharge.   ? ?  ?  ? ? ?Patient Goals and CMS Choice ?  ?  ?  ? ?Expected Discharge Plan and Services ?  ?  ?  ?  ?  ?                ?  ?  ?  ?  ?  ?  ?  ?  ?  ?  ? ?Prior Living Arrangements/Services ?  ?  ?  ?       ?  ?  ?  ?  ? ?Activities of Daily Living ?Home Assistive Devices/Equipment: Cane (specify quad or straight), Dentures (specify type), Eyeglasses, Walker (specify type) ?ADL Screening (condition at time of admission) ?Patient's cognitive ability adequate to safely complete daily activities?: Yes ?Is the patient deaf or have difficulty hearing?: No ?Does the patient have difficulty seeing, even when wearing glasses/contacts?: No ?Does the patient have difficulty concentrating, remembering, or making decisions?: Yes ?Patient able to express need for assistance with ADLs?: Yes ?Does the patient have difficulty dressing or bathing?: No ?Independently performs ADLs?: Yes (appropriate for developmental age) ?Does the patient have difficulty walking or climbing stairs?: Yes ?Weakness of Legs: None ?Weakness of Arms/Hands: None ? ?Permission Sought/Granted ?  ?  ?   ?   ?   ?   ? ?Emotional Assessment ?  ?  ?  ?  ?  ?  ? ?Admission diagnosis:  Acute cystitis [N30.00] ?Dizziness [R42] ?Acute cystitis without hematuria [N30.00] ?Patient Active Problem List  ? Diagnosis Date Noted  ? Mixed diabetic hyperlipidemia associated with type 2 diabetes mellitus ([place]) 07/16/2021  ? Abdominal pain 07/16/2021  ? Recurrent falls 07/16/2021  ? At risk for  polypharmacy 07/16/2021  ? Acute cystitis 07/16/2021  ? Metabolic encephalopathy   ? Sepsis ([place])   ? Chronic diastolic CHF (congestive heart failure) ([place]) 11/22/2017  ? Breast cancer of upper-outer quadrant of left female breast ([place]) 08/29/2015  ? SIRS (systemic inflammatory response syndrome) ([place]) 12/23/2014  ? Extrapyramidal disorder   ? DYSPHAGIA, PHARYNGOESOPHAGEAL PHASE 02/07/2009  ? Nausea, vomiting, diarrhea and abdominal pain 02/03/2009  ? TOBACCO USE, QUIT 02/03/2009  ? Vertigo 06/22/2008  ? Chronic back pain 10/26/2007  ? Acute cystitis without hematuria 06/30/2007  ? HYPERLIPIDEMIA 06/02/2007  ? Dementia without behavioral disturbance ([place]) 06/02/2007  ? Paranoid schizophrenia ([place]) 06/02/2007  ? PERIPHERAL VASCULAR DISEASE 06/02/2007  ? ALLERGIC RHINITIS 06/02/2007  ? IBS 06/02/2007  ? FATIGUE 06/02/2007  ? PERIPHERAL EDEMA 06/02/2007  ? Type 2 diabetes mellitus without complication, with long-term current use of insulin ([place]) 09/04/2006  ? ANEMIA-IRON DEFICIENCY 09/04/2006  ? ANXIETY 09/04/2006  ? PERIPHERAL NEUROPATHY 09/04/2006  ? Essential hypertension 09/04/2006  ? Coronary artery disease involving native coronary artery of native heart without angina pectoris 09/04/2006  ? GERD without esophagitis 09/04/2006  ? ?PCP:  [name], MD ?Pharmacy:   ?[facility], [place] ?  [address] ?[address] ?[place] [address] ?Phone: [phone] Fax: [phone] ? ? ? ? ?Social Determinants of Health (SDOH) Interventions ?  ? ?Readmission Risk Interventions ?   ? View : No data to display.  ?  ?  ?  ? ? ? ?

## 2021-07-17 NOTE — Progress Notes (Addendum)
Physical Therapy Treatment ?Patient Details ?Name: [name] ?MRN: [id number] ?DOB: 09-01-1940 ?Today's Date: 07/17/2021 ? ? ?History of Present Illness Pt is an 81 yo female presenting to the ED 07/15/21 with dizziness, N/V, and multiple recent falls. PMH includes dementia, DM2, CHF, schizophrenia, breast CA, HTN, PVD, GERD, CKD, and IBS. ? ?  ?PT Comments  ? ? Pt admitted with above diagnosis. Pt was able to tolerate Epley maneuver and to get up in chair. Hope to progress pt. If family can provide 24 hour care intiially, pt could go home with [facility] but if they cant still will need SNF with vestibular rehab.  Educated pt and daughter and other family regarding BPPV and gave handouts. Pt currently with functional limitations due to balance and endurance deficits. Pt will benefit from skilled PT to increase their independence and safety with mobility to allow discharge to the venue listed below.      ?Recommendations for follow up therapy are one component of a multi-disciplinary discharge planning process, led by the attending physician.  Recommendations may be updated based on patient status, additional functional criteria and insurance authorization. ? ?Follow Up Recommendations ? Skilled nursing-short term rehab (<3 hours/day) (Vestibular rehab) ?  ?  ?Assistance Recommended at Discharge Frequent or constant Supervision/Assistance  ?Patient can return home with the following Assistance with cooking/housework;Direct supervision/assist for medications management;Direct supervision/assist for financial management;Assist for transportation;Help with stairs or ramp for entrance;A lot of help with walking and/or transfers;A lot of help with bathing/dressing/bathroom ?  ?Equipment Recommendations ? Wheelchair (measurements PT);Wheelchair cushion (measurements PT)  ?  ?Recommendations for Other Services   ? ? ?  ?Precautions / Restrictions Precautions ?Precautions: Fall ?Precaution Comments: watch BP ?Restrictions ?Weight  Bearing Restrictions: No  ?  ? ?Mobility ? Bed Mobility ?Overal bed mobility: Needs Assistance ?Bed Mobility: Supine to Sit, Sit to Supine ?  ?  ?Supine to sit: Mod assist ?  ?  ?General bed mobility comments: pt required mod assist for trunk, LE support and scooting. Treated for left BPPV wtih Epley maneuver. ?  ? ?Transfers ?Overall transfer level: Needs assistance ?Equipment used: Rolling walker (2 wheels) ?Transfers: Sit to/from Stand, Bed to chair/wheelchair/BSC ?Sit to Stand: Min assist, From elevated surface, Mod assist ?  ?Step pivot transfers: Min assist, Mod assist ?  ?  ?  ?General transfer comment: Pt was able to stand to the RW wtih min assist and take pivotal steps to chair.  Pt with flexed trunk and needed mod cues to move LEs, move RW and to not sit too soon.  Daughter and caregiver  present. ?  ? ?Ambulation/Gait ?  ?  ?  ?  ?  ?  ?  ?  ? ? ?Stairs ?  ?  ?  ?  ?  ? ? ?Wheelchair Mobility ?  ? ?Modified Rankin (Stroke Patients Only) ?  ? ? ?  ?Balance Overall balance assessment: Needs assistance ?Sitting-balance support: No upper extremity supported, Feet unsupported ?Sitting balance-Leahy Scale: Zero ?Sitting balance - Comments: pt required min guard assist for trunk support in sitting once Epley complete. ?Postural control: Right lateral lean ?Standing balance support: Bilateral upper extremity supported, During functional activity, Reliant on assistive device for balance ?Standing balance-Leahy Scale: Poor ?Standing balance comment: relies on RW and external supporrt ?  ?  ?  ?  ?  ?  ?  ?  ?  ?  ?  ?  ? ?  ?Cognition Arousal/Alertness: Awake/alert ?Behavior During Therapy: Flat affect ?  Overall Cognitive Status: History of cognitive impairments - at baseline ?  ?  ?  ?  ?  ?  ?  ?  ?  ?  ?  ?  ?  ?  ?  ?  ?General Comments: pt has dementia per chart. Pt A+Ox3, unsure of the day in March; follow simple commands with increased time ?  ?  ? ?  ?Exercises Other Exercises ?Other Exercises: Gave pt a  handout with Nestor Lewandowsky and educated pt and daughter in performing exercise ? ?  ?General Comments General comments (skin integrity, edema, etc.): Gave pt handout regarding BPPV and provided discussion regarding BPPV. ?  ?  ? ?Pertinent Vitals/Pain Pain Assessment ?Pain Assessment: No/denies pain  ? ? ?Home Living   ?  ?  ?  ?  ?  ?  ?  ?  ?  ?   ?  ?Prior Function    ?  ?  ?   ? ?PT Goals (current goals can now be found in the care plan section) Acute Rehab PT Goals ?Patient Stated Goal: to go home ?Progress towards PT goals: Progressing toward goals ? ?  ?Frequency ? ? ? Min 2X/week ? ? ? ?  ?PT Plan Current plan remains appropriate  ? ? ?Co-evaluation   ?  ?  ?  ?  ? ?  ?AM-PAC PT "6 Clicks" Mobility   ?Outcome Measure ? Help needed turning from your back to your side while in a flat bed without using bedrails?: A Lot ?Help needed moving from lying on your back to sitting on the side of a flat bed without using bedrails?: A Lot ?Help needed moving to and from a bed to a chair (including a wheelchair)?: A Lot ?Help needed standing up from a chair using your arms (e.g., wheelchair or bedside chair)?: A Lot ?Help needed to walk in hospital room?: Total ?Help needed climbing 3-5 steps with a railing? : Total ?6 Click Score: 10 ? ?  ?End of Session Equipment Utilized During Treatment: Gait belt ?Activity Tolerance: Patient limited by fatigue;Patient tolerated treatment well ?Patient left: with call bell/phone within reach;in chair;with chair alarm set;with family/visitor present ?Nurse Communication: Mobility status ?PT Visit Diagnosis: Other abnormalities of gait and mobility (R26.89);Muscle weakness (generalized) (M62.81);History of falling (Z91.81);Other symptoms and signs involving the nervous system (R29.898);Dizziness and giddiness (R42) ?  ? ? ?Time: 1051-1130 ?PT Time Calculation (min) (ACUTE ONLY): 39 min ? ?Charges:  $Therapeutic Exercise: 8-22 mins ?$Therapeutic Activity: 23-37 mins          ?           ? ?[name],PT ?Acute Rehab Services ?[phone] ?[phone] (pager)  ? ? ?Alvira Philips ?07/17/2021, 12:21 PM ? ?

## 2021-07-18 DIAGNOSIS — N3000 Acute cystitis without hematuria: Secondary | ICD-10-CM | POA: Diagnosis not present

## 2021-07-18 LAB — GLUCOSE, CAPILLARY
Glucose-Capillary: 106 mg/dL — ABNORMAL HIGH (ref 70–99)
Glucose-Capillary: 145 mg/dL — ABNORMAL HIGH (ref 70–99)
Glucose-Capillary: 144 mg/dL — ABNORMAL HIGH (ref 70–99)
Glucose-Capillary: 160 mg/dL — ABNORMAL HIGH (ref 70–99)

## 2021-07-18 LAB — CBC
HCT: 29.8 % — ABNORMAL LOW (ref 36.0–46.0)
Hemoglobin: 9.8 g/dL — ABNORMAL LOW (ref 12.0–15.0)
MCH: 31 pg (ref 26.0–34.0)
MCHC: 32.9 g/dL (ref 30.0–36.0)
MCV: 94.3 fL (ref 80.0–100.0)
Platelets: 247 10*3/uL (ref 150–400)
RBC: 3.16 MIL/uL — ABNORMAL LOW (ref 3.87–5.11)
RDW: 16.1 % — ABNORMAL HIGH (ref 11.5–15.5)
WBC: 11.5 10*3/uL — ABNORMAL HIGH (ref 4.0–10.5)
nRBC: 0 % (ref 0.0–0.2)

## 2021-07-18 LAB — RENAL FUNCTION PANEL
Albumin: 1.9 g/dL — ABNORMAL LOW (ref 3.5–5.0)
Anion gap: 8 (ref 5–15)
BUN: 14 mg/dL (ref 8–23)
CO2: 21 mmol/L — ABNORMAL LOW (ref 22–32)
Calcium: 8.5 mg/dL — ABNORMAL LOW (ref 8.9–10.3)
Chloride: 111 mmol/L (ref 98–111)
Creatinine, Ser: 1.14 mg/dL — ABNORMAL HIGH (ref 0.44–1.00)
GFR, Estimated: 49 mL/min — ABNORMAL LOW (ref >60)
Glucose, Bld: 132 mg/dL — ABNORMAL HIGH (ref 70–99)
Phosphorus: 2.4 mg/dL — ABNORMAL LOW (ref 2.5–4.6)
Potassium: 4.9 mmol/L (ref 3.5–5.1)
Sodium: 140 mmol/L (ref 135–145)

## 2021-07-18 LAB — MAGNESIUM: Magnesium: 2 mg/dL (ref 1.7–2.4)

## 2021-07-18 MED ORDER — CEPHALEXIN 500 MG PO CAPS
500.0000 mg | ORAL_CAPSULE | Freq: Two times a day (BID) | ORAL | Status: AC
  Administered 2021-07-18 – 2021-07-20 (×4): 500 mg via ORAL
  Filled 2021-07-18 (×4): qty 1

## 2021-07-18 MED ORDER — MECLIZINE HCL 25 MG PO TABS
12.5000 mg | ORAL_TABLET | Freq: Three times a day (TID) | ORAL | Status: DC
Start: 2021-07-18 — End: 2021-07-20
  Administered 2021-07-18 – 2021-07-20 (×7): 12.5 mg via ORAL
  Filled 2021-07-18 (×7): qty 1

## 2021-07-18 NOTE — Progress Notes (Signed)
?PROGRESS NOTE ? ?[name] DJS:[id number] DOB: 1941/03/14 DOA: 07/15/2021 ?PCP: [name], MD ? ? LOS: 2 days  ? ?Brief Narrative / Interim history: ?81 year old F with history of dementia, paranoid schizophrenia, CAD, diastolic CHF, IDDM-2, HTN and osteoarthritis brought to ED from ALF/ILF with vertigo, nausea, vomiting, poor p.o. intake and diarrhea, and admitted with acute cystitis.  She had leukocytosis to 15.1.  UA concerning for UTI.  She also had 2 fall incidents at ILF.  CT head, lumbar x-ray and MRI brain without acute finding.  ? ?Subjective / 24h Interval events: ?No complaints, doing well.  Daughter is at bedside ? ?Assesement and Plan: ?Principal Problem: ?  Acute cystitis without hematuria ?Active Problems: ?  Vertigo ?  Nausea, vomiting, diarrhea and abdominal pain ?  Recurrent falls ?  Type 2 diabetes mellitus without complication, with long-term current use of insulin ([place]) ?  Coronary artery disease involving native coronary artery of native heart without angina pectoris ?  At risk for polypharmacy ?  Chronic diastolic CHF (congestive heart failure) ([place]) ?  Essential hypertension ?  GERD without esophagitis ?  Mixed diabetic hyperlipidemia associated with type 2 diabetes mellitus ([place]) ?  Paranoid schizophrenia ([place]) ?  Dementia without behavioral disturbance ([place]) ?  Abdominal pain ?  Acute cystitis ? ? ?Assessment and Plan: ?Principal problem ?Acute cystitis without hematuria -Patient is not a great historian, but admitted to UTI type symptoms on admission.  She also had leukocytosis.  Urinalysis was concerning for UTI and was placed on antibiotics.  Unfortunately urine culture appears to have been collected on 3/26 but it does not appear to have been sent.  Cultures are without growth.  Received 3 days of ceftriaxone, changed to Keflex for 2 additional days ? ?Active problems ?Recurrent falls -Reportedly had 2 fall incidents in the last 1 week.  Denies hitting head or loss of  consciousness.  Could be due to UTI, vertigo, osteoarthritis and a/or polypharmacy. ? ?Nausea, vomiting, diarrhea and abdominal pain -Due to UTI?    She has history of IBS. Abdominal exam benign. Could have gastroparesis from diabetes although fairly controlled.  Polypharmacy could contribute.  Appears improved, she is tolerating food. ? ?Vertigo -?BPPV, She describes her dizziness as spinning. Neuro exam reassuring.  MRI brain without acute finding but moderate chronic small vessel ischemia with multiple chronic lacunar infarcts.  Still persistent today, significantly impairing her ability to function.  Scheduled meclizine today.  If vertigo is not improving daughter favors SNF as per PT recommendations  ? ?At risk for polypharmacy -Elderly patient on clonidine, gabapentin, high-dose Remeron, reduce Remeron from 45 to 15 mg daily. Discontinued clonidine, reportedly not taking ? ?Coronary artery disease involving native coronary artery of native heart without angina pectoris -No cardiopulmonary symptoms.  Continue home medications ? ?Type 2 diabetes mellitus without complication, with long-term current use of insulin (HCC) -controlled, A1c less than 7. ? ?CBG (last 3)  ?Recent Labs  ?  07/17/21 ?1614 07/17/21 ?2040 07/18/21 ?0727  ?GLUCAP 123* 178* 106*  ? ? ?Chronic diastolic CHF (congestive heart failure) (HCC) -appears euvolemic on exam ? ?Essential hypertension -monitor, she no longer uses clonidine patch.  Continue Coreg, amlodipine, Avapro.  Blood pressure controlled ? ?GERD without esophagitis -Continue PPI ? ?Paranoid schizophrenia ([place]) -Stable.  Continue home Haldol and clozapine ? ?Dementia without behavioral disturbance ([place]) -Stable.  She is fully oriented but not a great historian. Continue home Aricept and Namenda ? ? ? ?Scheduled Meds: ? amLODipine  10  mg Oral Daily  ? aspirin EC  81 mg Oral Daily  ? atorvastatin  40 mg Oral Daily  ? carvedilol  12.5 mg Oral BID WC  ? cloZAPine  150 mg Oral QHS  ?  donepezil  10 mg Oral QHS  ? enoxaparin (LOVENOX) injection  40 mg Subcutaneous Q24H  ? fluticasone  2 spray Each Nare Daily  ? haloperidol  10 mg Oral QHS  ? haloperidol  5 mg Oral Daily  ? insulin aspart  0-15 Units Subcutaneous TID AC & HS  ? irbesartan  150 mg Oral Daily  ? loratadine  10 mg Oral Daily  ? LORazepam  1 mg Intravenous Once  ? meclizine  12.5 mg Oral TID  ? memantine  10 mg Oral BID  ? mirtazapine  15 mg Oral QHS  ? pantoprazole  40 mg Oral Daily  ? ?Continuous Infusions: ? sodium chloride Stopped (07/18/21 0127)  ? cefTRIAXone (ROCEPHIN)  IV Stopped (07/18/21 0127)  ? ?PRN Meds:.sodium chloride, acetaminophen **OR** acetaminophen, gabapentin, hydrALAZINE, ondansetron **OR** ondansetron (ZOFRAN) IV, polyethylene glycol ? ?Diet Orders (From admission, onward)  ? ?  Start     Ordered  ? 07/16/21 0210  Diet heart healthy/carb modified Room service appropriate? Yes; Fluid consistency: Thin  Diet effective now       ?Question Answer Comment  ?Diet-HS Snack? Nothing   ?Room service appropriate? Yes   ?Fluid consistency: Thin   ?  ? 07/16/21 0210  ? ?  ?  ? ?  ? ? ?DVT prophylaxis: enoxaparin (LOVENOX) injection 40 mg Start: 07/16/21 1000 ? ? ?Lab Results  ?Component Value Date  ? PLT 247 07/18/2021  ? ? ?  Code Status: Full Code ? ?Family Communication: Daughter at bedside ? ?Status is: Inpatient ? ?Remains inpatient appropriate because: persistent symptoms ? ?Level of care: Telemetry Medical ? ?Consultants:  ?none ? ?Procedures:  ?none ? ?Microbiology  ?none ? ?Antimicrobials: ?Keflex   ? ? ?Objective: ?Vitals:  ? 07/17/21 1705 07/17/21 2041 07/18/21 0427 07/18/21 0839  ?BP: (!) 165/70 (!) 143/55 (!) 124/55 (!) 139/55  ?Pulse: 86 69 67 95  ?Resp: '18 18 18 16  '$ ?Temp: 98.8 ?F (37.1 ?C) 98.2 ?F (36.8 ?C) (!) 97.3 ?F (36.3 ?C) 97.9 ?F (36.6 ?C)  ?TempSrc: Oral Oral Oral Oral  ?SpO2: 100% 95% 98% 100%  ?Weight:      ?Height:      ? ? ?Intake/Output Summary (Last 24 hours) at 07/18/2021 1104 ?Last data filed  at 07/18/2021 0900 ?Gross per 24 hour  ?Intake 660.29 ml  ?Output 600 ml  ?Net 60.29 ml  ? ?Wt Readings from Last 3 Encounters:  ?07/16/21 65.5 kg  ?11/27/17 67.3 kg  ?11/21/17 64 kg  ? ? ?Examination: ? ?Constitutional: NAD ?Eyes: no scleral icterus ?ENMT: Mucous membranes are moist.  ?Neck: normal, supple ?Respiratory: clear to auscultation bilaterally, no wheezing, no crackles.  ?Cardiovascular: Regular rate and rhythm, no murmurs / rubs / gallops.  ?Abdomen: non distended, no tenderness. Bowel sounds positive.  ?Musculoskeletal: no clubbing / cyanosis.  ?Skin: no rashes ?Neurologic: non focal  ? ? ?Data Reviewed: I have independently reviewed following labs and imaging studies ? ?CBC ?Recent Labs  ?Lab 07/15/21 ?1732 07/16/21 ?0422 07/17/21 ?0455 07/18/21 ?0401  ?WBC 15.1* 12.3* 10.2 11.5*  ?HGB 11.0* 10.5* 9.4* 9.8*  ?HCT 32.8* 32.8* 29.8* 29.8*  ?PLT 250 227 234 247  ?MCV 93.2 96.5 95.5 94.3  ?MCH 31.3 30.9 30.1 31.0  ?MCHC 33.5 32.0 31.5  32.9  ?RDW 15.1 15.4 15.9* 16.1*  ?LYMPHSABS 2.5 1.9  --   --   ?MONOABS 1.4* 1.0  --   --   ?EOSABS 0.0 0.0  --   --   ?BASOSABS 0.0 0.0  --   --   ? ? ?Recent Labs  ?Lab 07/15/21 ?1732 07/16/21 ?0228 07/16/21 ?0422 07/17/21 ?2229 07/18/21 ?0401  ?NA 144  --  145 144 140  ?K 3.3*  --  3.5 3.4* 4.9  ?CL 110  --  116* 116* 111  ?CO2 25  --  22 22 21*  ?GLUCOSE 124*  --  157* 88 132*  ?BUN 9  --  7* <5* 14  ?CREATININE 0.83  --  0.86 1.02* 1.14*  ?CALCIUM 8.6*  --  8.1* 8.3* 8.5*  ?AST 27  --  29  --   --   ?ALT 21  --  23  --   --   ?ALKPHOS 85  --  74  --   --   ?BILITOT 0.5  --  0.4  --   --   ?ALBUMIN 2.3*  --  2.0* 1.8* 1.9*  ?MG 1.9  --  1.7 1.7 2.0  ?LATICACIDVEN  --  1.7  --   --   --   ?HGBA1C  --   --  5.9*  --   --   ? ? ?------------------------------------------------------------------------------------------------------------------ ?No results for input(s): CHOL, HDL, LDLCALC, TRIG, CHOLHDL, LDLDIRECT in the last 72 hours. ? ?Lab Results  ?Component Value Date  ?  HGBA1C 5.9 (H) 07/16/2021  ? ?------------------------------------------------------------------------------------------------------------------ ?No results for input(s): TSH, T4TOTAL, T3FREE, THYROIDAB in the las

## 2021-07-18 NOTE — [place] FL2 (Signed)
?[place] MEDICAID FL2 LEVEL OF CARE SCREENING TOOL  ?  ? ?IDENTIFICATION  ?Patient Name: ?[name] Birthdate: 11-16-1940 Sex: female Admission Date (Current Location): ?07/15/2021  ?[place] and [place] Number: ? [place] ?[id number] M Facility and Address:  ?[facility] [place], [place] [address], [place], [place] [address] ?     Provider Number: ?[id number]  ?Attending Physician Name and Address:  ?Caren Griffins, MD ? Relative Name and Phone Number:  ?[facility] Daughter [phone] ?   ?Current Level of Care: ?Hospital Recommended Level of Care: ?[place] Prior Approval Number: ?  ? ?Date Approved/Denied: ?  PASRR Number: ?[id number] A ? ?Discharge Plan: ?SNF ?  ? ?Current Diagnoses: ?Patient Active Problem List  ? Diagnosis Date Noted  ? Mixed diabetic hyperlipidemia associated with type 2 diabetes mellitus ([place]) 07/16/2021  ? Abdominal pain 07/16/2021  ? Recurrent falls 07/16/2021  ? At risk for polypharmacy 07/16/2021  ? Acute cystitis 07/16/2021  ? Metabolic encephalopathy   ? Sepsis ([place])   ? Chronic diastolic CHF (congestive heart failure) ([place]) 11/22/2017  ? Breast cancer of upper-outer quadrant of left female breast ([place]) 08/29/2015  ? SIRS (systemic inflammatory response syndrome) ([place]) 12/23/2014  ? Extrapyramidal disorder   ? DYSPHAGIA, PHARYNGOESOPHAGEAL PHASE 02/07/2009  ? Nausea, vomiting, diarrhea and abdominal pain 02/03/2009  ? TOBACCO USE, QUIT 02/03/2009  ? Vertigo 06/22/2008  ? Chronic back pain 10/26/2007  ? Acute cystitis without hematuria 06/30/2007  ? HYPERLIPIDEMIA 06/02/2007  ? Dementia without behavioral disturbance ([place]) 06/02/2007  ? Paranoid schizophrenia ([place]) 06/02/2007  ? PERIPHERAL VASCULAR DISEASE 06/02/2007  ? ALLERGIC RHINITIS 06/02/2007  ? IBS 06/02/2007  ? FATIGUE 06/02/2007  ? PERIPHERAL EDEMA 06/02/2007  ? Type 2 diabetes mellitus without complication, with long-term current use of insulin ([place]) 09/04/2006  ? ANEMIA-IRON DEFICIENCY  09/04/2006  ? ANXIETY 09/04/2006  ? PERIPHERAL NEUROPATHY 09/04/2006  ? Essential hypertension 09/04/2006  ? Coronary artery disease involving native coronary artery of native heart without angina pectoris 09/04/2006  ? GERD without esophagitis 09/04/2006  ? ? ?Orientation RESPIRATION BLADDER Height & Weight   ?  ?Self, Time, Situation, Place ? Normal Incontinent Weight: 144 lb 6.4 oz (65.5 kg) ?Height:  5' (152.4 cm)  ?BEHAVIORAL SYMPTOMS/MOOD NEUROLOGICAL BOWEL NUTRITION STATUS  ?    Continent Diet (see d/c summary)  ?AMBULATORY STATUS COMMUNICATION OF NEEDS Skin   ?Extensive Assist Verbally Normal ?  ?  ?  ?    ?     ?     ? ? ?Personal Care Assistance Level of Assistance  ?Bathing, Feeding, Dressing Bathing Assistance: Maximum assistance ?Feeding assistance: Limited assistance (needs set up) ?Dressing Assistance: Maximum assistance ?   ? ?Functional Limitations Info  ?Hearing, Speech, Sight Sight Info: Impaired ?Hearing Info: Adequate ?Speech Info: Adequate  ? ? ?SPECIAL CARE FACTORS FREQUENCY  ?PT (By licensed PT), OT (By licensed OT)   ?  ?PT Frequency: 5x/ week  (Vestibular PT) ?OT Frequency: 5x/ week ?  ?  ?  ?   ? ? ?Contractures Contractures Info: Not present  ? ? ?Additional Factors Info  ?Code Status, Allergies, Psychotropic, Insulin Sliding Scale Code Status Info: Full ?Allergies Info: Codeine Phosphate   Dexilant (Dexlansoprazole)   Fish Allergy   Glimepiride   Glyburide   Levofloxacin   Shellfish Allergy   Tramadol ?Psychotropic Info: see d/c med list ?Insulin Sliding Scale Info: see d/c med list ?  ?   ? ?Current Medications (07/18/2021):  This is the current hospital active  medication list ?Current Facility-Administered Medications  ?Medication Dose Route Frequency Provider Last Rate Last Admin  ? 0.9 %  sodium chloride infusion   Intravenous PRN Mercy Riding, MD   Stopped at 07/18/21 0127  ? acetaminophen (TYLENOL) tablet 650 mg  650 mg Oral Q6H PRN Vernelle Emerald, MD   650 mg at 07/18/21 1347   ? Or  ? acetaminophen (TYLENOL) suppository 650 mg  650 mg Rectal Q6H PRN Shalhoub, Sherryll Burger, MD      ? amLODipine (NORVASC) tablet 10 mg  10 mg Oral Daily Shalhoub, Sherryll Burger, MD   10 mg at 07/18/21 0847  ? aspirin EC tablet 81 mg  81 mg Oral Daily Vernelle Emerald, MD   81 mg at 07/18/21 0847  ? atorvastatin (LIPITOR) tablet 40 mg  40 mg Oral Daily Shalhoub, Sherryll Burger, MD   40 mg at 07/18/21 0847  ? carvedilol (COREG) tablet 12.5 mg  12.5 mg Oral BID WC Shalhoub, Sherryll Burger, MD   12.5 mg at 07/18/21 0847  ? cephALEXin (KEFLEX) capsule 500 mg  500 mg Oral Q12H Gherghe, Costin M, MD      ? cloZAPine (CLOZARIL) tablet 150 mg  150 mg Oral QHS Shalhoub, Sherryll Burger, MD   150 mg at 07/17/21 2137  ? donepezil (ARICEPT) tablet 10 mg  10 mg Oral QHS Vernelle Emerald, MD   10 mg at 07/17/21 2136  ? enoxaparin (LOVENOX) injection 40 mg  40 mg Subcutaneous Q24H Shalhoub, Sherryll Burger, MD   40 mg at 07/18/21 0910  ? fluticasone (FLONASE) 50 MCG/ACT nasal spray 2 spray  2 spray Each Nare Daily Shalhoub, Sherryll Burger, MD   2 spray at 07/18/21 0847  ? gabapentin (NEURONTIN) capsule 100 mg  100 mg Oral QHS PRN Shalhoub, Sherryll Burger, MD      ? haloperidol (HALDOL) tablet 10 mg  10 mg Oral QHS Shalhoub, Sherryll Burger, MD   10 mg at 07/17/21 2137  ? haloperidol (HALDOL) tablet 5 mg  5 mg Oral Daily Shalhoub, Sherryll Burger, MD   5 mg at 07/18/21 0865  ? hydrALAZINE (APRESOLINE) tablet 25 mg  25 mg Oral Q6H PRN Wendee Beavers T, MD      ? insulin aspart (novoLOG) injection 0-15 Units  0-15 Units Subcutaneous TID AC & HS Shalhoub, Sherryll Burger, MD   2 Units at 07/18/21 1223  ? irbesartan (AVAPRO) tablet 150 mg  150 mg Oral Daily Shalhoub, Sherryll Burger, MD   150 mg at 07/18/21 0847  ? loratadine (CLARITIN) tablet 10 mg  10 mg Oral Daily Shalhoub, Sherryll Burger, MD   10 mg at 07/18/21 0847  ? LORazepam (ATIVAN) injection 1 mg  1 mg Intravenous Once Isla Pence, MD      ? meclizine (ANTIVERT) tablet 12.5 mg  12.5 mg Oral TID Caren Griffins, MD   12.5 mg at 07/18/21 1223   ? memantine (NAMENDA) tablet 10 mg  10 mg Oral BID Vernelle Emerald, MD   10 mg at 07/18/21 0847  ? mirtazapine (REMERON) tablet 15 mg  15 mg Oral QHS Wendee Beavers T, MD   15 mg at 07/17/21 2138  ? ondansetron (ZOFRAN) tablet 4 mg  4 mg Oral Q6H PRN Shalhoub, Sherryll Burger, MD      ? Or  ? ondansetron (ZOFRAN) injection 4 mg  4 mg Intravenous Q6H PRN Shalhoub, Sherryll Burger, MD      ? pantoprazole (PROTONIX) EC tablet 40 mg  40  mg Oral Daily Vernelle Emerald, MD   40 mg at 07/18/21 0847  ? polyethylene glycol (MIRALAX / GLYCOLAX) packet 17 g  17 g Oral Daily PRN Vernelle Emerald, MD   17 g at 07/17/21 1121  ? ? ? ?Discharge Medications: ?Please see discharge summary for a list of discharge medications. ? ?Relevant Imaging Results: ? ?Relevant Lab Results: ? ? ?Additional Information ?SS# [phone],  COVID vax x 4;  5' 144lbs ? ?Paulene Floor Yu Peggs, LCSWA ? ? ? ? ?

## 2021-07-18 NOTE — Progress Notes (Signed)
Occupational Therapy Treatment ?Patient Details ?Name: [name] ?MRN: [id number] ?DOB: 1940/05/30 ?Today's Date: 07/18/2021 ? ? ?History of present illness Pt is an 81 yo female presenting to the ED 07/15/21 with dizziness, N/V, and multiple recent falls. PMH includes dementia, DM2, CHF, schizophrenia, breast CA, HTN, PVD, GERD, CKD, and IBS. ?  ?OT comments ? Pt up in chair finishing with PT upon entry. Attempted grooming tasks at sink, dizzy upon standing and requires completion of tasks in sitting. Attempted focused gaze during transition to stand but pt reports this is not helping/ unsure if pt is able to complete cognitively.  Pt engaged in BUE exercises (shoulder flexion/chair pushups) in recliner.  Noted improved sit to stand transfer with min assist, and dynamic sitting balance with supervision today.  Increased time and rest breaks required.  Will follow acutely.   ? ?Recommendations for follow up therapy are one component of a multi-disciplinary discharge planning process, led by the attending physician.  Recommendations may be updated based on patient status, additional functional criteria and insurance authorization. ?   ?Follow Up Recommendations ? Skilled nursing-short term rehab (<3 hours/day)  ?  ?Assistance Recommended at Discharge Frequent or constant Supervision/Assistance  ?Patient can return home with the following ? Two people to help with walking and/or transfers;A lot of help with bathing/dressing/bathroom;Assistance with cooking/housework;Direct supervision/assist for medications management;Direct supervision/assist for financial management;Assist for transportation;Help with stairs or ramp for entrance ?  ?Equipment Recommendations ? BSC/3in1  ?  ?Recommendations for Other Services   ? ?  ?Precautions / Restrictions Precautions ?Precautions: Fall ?Restrictions ?Weight Bearing Restrictions: No  ? ? ?  ? ?Mobility Bed Mobility ?  ?  ?  ?  ?  ?  ?  ?General bed mobility comments: OOB upon  entry ?  ? ?Transfers ?  ?  ?  ?  ?  ?  ?  ?  ?  ?  ?  ?  ?Balance Overall balance assessment: Needs assistance ?Sitting-balance support: No upper extremity supported, Feet supported ?Sitting balance-Leahy Scale: Good ?Sitting balance - Comments: close supervision ?  ?Standing balance support: Bilateral upper extremity supported, During functional activity ?Standing balance-Leahy Scale: Poor ?Standing balance comment: relies on RW and external supporrt ?  ?  ?  ?  ?  ?  ?  ?  ?  ?  ?  ?   ? ?ADL either performed or assessed with clinical judgement  ? ?ADL Overall ADL's : Needs assistance/impaired ?  ?  ?Grooming: Minimal assistance;Sitting ?Grooming Details (indicate cue type and reason): sitting, brief stand at sink but pt becomes dizzy and needs to sit ?  ?  ?  ?  ?Upper Body Dressing : Min guard;Sitting ?  ?Lower Body Dressing: Maximal assistance;Sit to/from stand ?  ?  ?Toilet Transfer Details (indicate cue type and reason): pt declined, just completed with PT ?  ?  ?  ?  ?  ?General ADL Comments: family present, daughter reports assist for most ADLs but able to use restroom indepednently PTA (daughter is caregiver) ?  ? ?Extremity/Trunk Assessment   ?  ?  ?  ?  ?  ? ?Vision   ?  ?  ?Perception   ?  ?Praxis   ?  ? ?Cognition Arousal/Alertness: Awake/alert ?Behavior During Therapy: Flat affect ?Overall Cognitive Status: History of cognitive impairments - at baseline ?  ?  ?  ?  ?  ?  ?  ?  ?  ?  ?  ?  ?  ?  ?  ?  ?  General Comments: pt following simple commands, decreased recall and attention but hx of dementia ?  ?  ?   ?Exercises Exercises: Other exercises ?Other Exercises ?Other Exercises: completed shoulder flexion x 10 reps 2 sets; chair pushups x 5 reps 2 sets ? ?  ?Shoulder Instructions   ? ? ?  ?General Comments    ? ? ?Pertinent Vitals/ Pain       Pain Assessment ?Pain Assessment: No/denies pain ? ?Home Living   ?  ?  ?  ?  ?  ?  ?  ?  ?  ?  ?  ?  ?  ?  ?  ?  ?  ?  ? ?  ?Prior Functioning/Environment    ?   ?  ?  ?   ? ?Frequency ? Min 2X/week  ? ? ? ? ?  ?Progress Toward Goals ? ?OT Goals(current goals can now be found in the care plan section) ? Progress towards OT goals: Progressing toward goals ? ?Acute Rehab OT Goals ?Patient Stated Goal: get better ?OT Goal Formulation: With patient ?Time For Goal Achievement: 07/30/21 ?Potential to Achieve Goals: Good  ?Plan Discharge plan remains appropriate;Frequency remains appropriate   ? ?Co-evaluation ? ? ?   ?  ?  ?  ?  ? ?  ?AM-PAC OT "6 Clicks" Daily Activity     ?Outcome Measure ? ? Help from another person eating meals?: A Little ?Help from another person taking care of personal grooming?: A Little ?Help from another person toileting, which includes using toliet, bedpan, or urinal?: A Lot ?Help from another person bathing (including washing, rinsing, drying)?: A Lot ?Help from another person to put on and taking off regular upper body clothing?: A Little ?Help from another person to put on and taking off regular lower body clothing?: A Lot ?6 Click Score: 15 ? ?  ?End of Session Equipment Utilized During Treatment: Gait belt ? ?OT Visit Diagnosis: Other abnormalities of gait and mobility (R26.89);Muscle weakness (generalized) (M62.81);Dizziness and giddiness (R42) ?  ?Activity Tolerance Patient tolerated treatment well ?  ?Patient Left in chair;with call bell/phone within reach;with chair alarm set;with family/visitor present ?  ?Nurse Communication Mobility status ?  ? ?   ? ?Time: 1610-9604 ?OT Time Calculation (min): 30 min ? ?Charges: OT General Charges ?$OT Visit: 1 Visit ?OT Treatments ?$Self Care/Home Management : 23-37 mins ? ?Jolaine Artist, OT ?Acute Rehabilitation Services ?Pager [phone] ?Office [phone] ? ? ?Delight Stare ?07/18/2021, 10:46 AM ?

## 2021-07-18 NOTE — TOC Initial Note (Signed)
Transition of Care (TOC) - Initial/Assessment Note  ? ? ?Patient Details  ?Name: [name] ?MRN: [id number] ?Date of Birth: 04-18-1941 ? ?Transition of Care (TOC) CM/SW Contact:    ?[name], LCSWA ?Phone Number: ?07/18/2021, 2:26 PM ? ?Clinical Narrative:                 ?CSW received consult for possible SNF placement at time of discharge. CSW spoke with patient's daughter, [name] Patient's daughter expressed understanding of PT recommendation and is agreeable to SNF placement at time of discharge if the patient is still unable to mobilize without getting dizzy.  Family reports preference for [facility] but is open to other facilities in [place]. CSW discussed insurance authorization process and will provide Medicare SNF ratings list. Patient has received "at least 4" COVID vaccines. CSW will send out referrals for review.  No further questions reported at this time.  ? ?Skilled Nursing Rehab Facilities-   [email or web address]   Ratings out of 5 possible   ?Name Address  Phone # Quality Care Staffing Health Inspection Overall  ?[facility] [address], [place] '5 5 2 4  '$ ?[facility]  [address], [place] [phone] '4 2 5 5  '$ ?[facility] [place], [place] '4 1 1 1  '$ ?[place] [address] [phone] '2 2 4 4  '$ ?[facility] [address] [place] '1 1 2 1  '$ ?[place] [phone] '2 1 4 3  '$ ?[place] '5 2 2 3  '$ ?[facility] [place] [address] [place] '5 2 2 3  '$ ?[facility] [place] [phone] '5 1 2 2  '$ ?[facility] [address], [place] [phone] '4 1 1 1  '$ ?[facility] [address] [place] [phone] '4 1 2 1  '$ ?[facility] [place]  [phone] '4 1 1 1  '$ ?        ?[place], [place]      ?[facility] [place] '4 2 3 3  '$ ?[facility] [place], [place] '3 1 5 4  '$ ?[place], [place] [address] [place], [place] [phone] '2 1 1 1  '$ ?[facility] [address] [place] [phone] '2 2 3 3  '$ ?        ?[address] (no [facility]) [address], [place] [phone] '4 5 5 5  '$ ?[facility] (No Hum[employer]) [address], [place] '4 1 4 3  '$ ?[facility] (No [employer]HC) [address] [place] [phone] '5 5 5 5  '$ ?[facility] [address] [place] [phone] '3 2 4 4  '$ ?[facility] [address] [place] [phone] '3 3 4 4  '$ ?[place] [address] [place] '1 1 2 1  '$ ?[place] [phone] '2 1 1 1  '$ ?[facility] [place] '5 2 4 5  '$ ?[facility] [address], [place] '3 1 1 1  '$ ?[facility] [place] '2 1 2 1  '$ ?        ?[facility] [address], [place] [phone] '1 1 1 1  '$ ?[facility] [address], [place]  [phone] '2 4 2 2  '$ ?[facility] [place] [address], [place]  [phone] '5 2 3 4  '$ ?[place] [address] [place] '2 1 1 1  '$ ?[place] (No Humana) [address], [place] '2 1 3 2  '$ ?[facility] [address] [place] [phone] '3 1 1 1  '$ ?        ?[facility] [place], [place] '5 4 5 5  '$ ?[facility])  [address], [place] '2 2 3 3  '$ ?[facility]) [place], [place] '3 2 4 4  '$ ?[place] [address] [place] '4 3 4 4  '$ ?[address] [place], [place] '3 3 1 1  '$ ?[facility]) [place], [place] '2 2 4 4  '$ ?  ? ?Expected Discharge Plan: [place] ?Barriers to Discharge: Continued Medical Work up, Ship broker, SNF Pending bed offer ? ? ?Patient Goals and CMS Choice ?  ?CMS Medicare.gov Compare Post Acute Care list provided to:: Patient Represenative (must comment) ?Choice offered to / list presented to : Adult Children ? ?Expected Discharge Plan and Services ?Expected Discharge Plan: [place] ?  ?  ?  ?Living arrangements for the past 2 months: [place] ?                ?  ?  ?  ?  ?  ?  ?  ?  ?  ?  ? ?Prior Living Arrangements/Services ?Living arrangements for the past 2 months: [place] ?Lives with:: Facility Resident ?Patient language and need for interpreter reviewed:: Yes ?Do you feel safe going back to the place where you live?: Yes      ?Need for Family Participation in Patient Care: Yes (Comment) ?Care giver support system in place?: Yes (comment) ?  ?Criminal Activity/Legal Involvement Pertinent to Current Situation/Hospitalization: No - Comment as needed ? ?Activities of Daily Living ?Home Assistive Devices/Equipment: Cane (specify quad or straight), Dentures (specify type), Eyeglasses, [place] (specify type) ?ADL Screening (condition at time of admission) ?Patient's cognitive ability adequate to safely complete daily activities?: Yes ?Is the patient deaf or have difficulty hearing?: No ?Does the patient have difficulty seeing, even when wearing glasses/contacts?: No ?Does the patient have difficulty concentrating, remembering, or making decisions?: Yes ?Patient able to express need for assistance with ADLs?: Yes ?Does the patient have difficulty dressing or bathing?: No ?Independently performs ADLs?: Yes (appropriate for developmental age) ?Does the patient have difficulty walking or climbing stairs?: Yes ?Weakness of Legs: None ?Weakness of Arms/Hands:  None ? ?Permission Sought/Granted ?  ?Permission granted to share information with : Yes, Verbal Permission Granted ?   ? Permission granted to share info w AGENCY: SNF ?   ?   ? ?Emotional Assessment ?  ?  ?  ?Orientation: : Oriented to Self, Oriented to Place, Oriented to  Time, Oriented to Situation ?Alcohol / Substance Use: Not Applicable ?Psych Involvement: No (comment) ? ?Admission diagnosis:  Acute cystitis [N30.00] ?Dizziness [R42] ?Acute cystitis without hematuria [N30.00] ?Patient Active Problem List  ? Diagnosis Date Noted  ? Mixed diabetic hyperlipidemia associated with type 2 diabetes mellitus ([place]) 07/16/2021  ? Abdominal pain 07/16/2021  ? Recurrent falls 07/16/2021  ? At risk for polypharmacy 07/16/2021  ? Acute cystitis 07/16/2021  ? Metabolic encephalopathy   ? Sepsis ([place])   ? Chronic diastolic CHF (congestive heart failure) ([place]) 11/22/2017  ? Breast cancer  of upper-outer quadrant of left female breast ([place]) 08/29/2015  ? SIRS (systemic inflammatory response syndrome) ([place]) 12/23/2014  ? Extrapyramidal disorder   ? DYSPHAGIA, PHARYNGOESOPHAGEAL PHASE 02/07/2009  ? Nausea, vomiting, diarrhea and abdominal pain 02/03/2009  ? TOBACCO USE, QUIT 02/03/2009  ? Vertigo 06/22/2008  ? Chronic back pain 10/26/2007  ? Acute cystitis without hematuria 06/30/2007  ? HYPERLIPIDEMIA 06/02/2007  ? Dementia without behavioral disturbance ([place]) 06/02/2007  ? Paranoid schizophrenia ([place]) 06/02/2007  ? PERIPHERAL VASCULAR DISEASE 06/02/2007  ? ALLERGIC RHINITIS 06/02/2007  ? IBS 06/02/2007  ? FATIGUE 06/02/2007  ? PERIPHERAL EDEMA 06/02/2007  ? Type 2 diabetes mellitus without complication, with long-term current use of insulin ([place]) 09/04/2006  ? ANEMIA-IRON DEFICIENCY 09/04/2006  ? ANXIETY 09/04/2006  ? PERIPHERAL NEUROPATHY 09/04/2006  ? Essential hypertension 09/04/2006  ? Coronary artery disease involving native coronary artery of native heart without angina pectoris 09/04/2006  ? GERD without esophagitis  09/04/2006  ? ?PCP:  [name], MD ?Pharmacy:   ?[place] [place] ?[place] [address] ?Phone: [phone] Fax: [phone] ? ? ? ? ?Social

## 2021-07-18 NOTE — Progress Notes (Signed)
PT note ?PT recommendation continues to be for SNF for Rehab as her vertigo is still debilitating enough for pt to require mod assist for transfers and pt will be alone for up to 4 hours day in her A living per family.  Asked RN if she can give Antivert as it is ordered.  Will continue therapy.   ?[name] M,PT ?Acute Rehab Services ?[phone] ?[phone] (pager)  ?

## 2021-07-18 NOTE — Progress Notes (Signed)
Physical Therapy Treatment ?Patient Details ?Name: [name] ?MRN: [id number] ?DOB: 11/02/1940 ?Today's Date: 07/18/2021 ? ? ?History of Present Illness Pt is an 81 yo female presenting to the ED 07/15/21 with dizziness, N/V, and multiple recent falls. PMH includes dementia, DM2, CHF, schizophrenia, breast CA, HTN, PVD, GERD, CKD, and IBS. ? ?  ?PT Comments  ? ? Pt admitted with above diagnosis. Pt still having BPPV and treated again.  Pt was able to transfer to 3N1 and have BM and then walked a few steps today.  However still needs mod assist and +2 for safety. Discussed with daughters as well as CM and SW regarding D/C plan for SNF for rehab.  Pt currently with functional limitations due to balance and endurance deficits. Pt will benefit from skilled PT to increase their independence and safety with mobility to allow discharge to the venue listed below.      ?Recommendations for follow up therapy are one component of a multi-disciplinary discharge planning process, led by the attending physician.  Recommendations may be updated based on patient status, additional functional criteria and insurance authorization. ? ?Follow Up Recommendations ? Skilled nursing-short term rehab (<3 hours/day) (Vestibular rehab) ?  ?  ?Assistance Recommended at Discharge Frequent or constant Supervision/Assistance  ?Patient can return home with the following Assistance with cooking/housework;Direct supervision/assist for medications management;Direct supervision/assist for financial management;Assist for transportation;Help with stairs or ramp for entrance;A lot of help with walking and/or transfers;A lot of help with bathing/dressing/bathroom ?  ?Equipment Recommendations ? Wheelchair (measurements PT);Wheelchair cushion (measurements PT)  ?  ?Recommendations for Other Services   ? ? ?  ?Precautions / Restrictions Precautions ?Precautions: Fall ?Precaution Comments: watch BP ?Restrictions ?Weight Bearing Restrictions: No  ?   ? ?Mobility ? Bed Mobility ?Overal bed mobility: Needs Assistance ?Bed Mobility: Supine to Sit, Sit to Supine ?  ?  ?Supine to sit: Mod assist ?  ?  ?General bed mobility comments: Performed [place] again.  Pt required mod A for trunk elevation and LE management to EOB. Pt needed mod A to scoot  hips to EOB. Stated she needed to have BM. ?  ? ?Transfers ?Overall transfer level: Needs assistance ?Equipment used: Rolling walker (2 wheels) ?Transfers: Sit to/from Stand, Bed to chair/wheelchair/BSC ?Sit to Stand: From elevated surface, Mod assist, +2 safety/equipment ?  ?Step pivot transfers: Mod assist, +2 safety/equipment, From elevated surface ?  ?  ?  ?General transfer comment: Pt was able to stand to the RW wtih mod assist and take pivotal steps to 3N1 and then to chair with incr time and incr assist.  Pt with flexed trunk and needed mod cues to move LEs, move RW and to not sit too soon.  Daughter and caregiver  present. ?  ? ?Ambulation/Gait ?Ambulation/Gait assistance: Mod assist, +2 safety/equipment ?Gait Distance (Feet): 5 Feet ?Assistive device: Rolling walker (2 wheels) ?Gait Pattern/deviations: Decreased stride length, Step-to pattern, Shuffle, Leaning posteriorly, Drifts right/left, Trunk flexed, Narrow base of support ?  ?Gait velocity interpretation: <1.31 ft/sec, indicative of household ambulator ?  ?General Gait Details: Pt needed mod assist for upright stability with gait. Pt with flexed posture and needs cuing for all aspects of ambulation. Needed assist for moving RW. Needed cues to take large steps as she shuffles and keeps narrow BOS unless cued.  Needs heavy tactile assist at low back to stand up. Discussed with daughters the significant assist that pt is needing and why SNF is recommended intiially. ? ? ?Stairs ?  ?  ?  ?  ?  ? ? ?  Wheelchair Mobility ?  ? ?Modified Rankin (Stroke Patients Only) ?  ? ? ?  ?Balance Overall balance assessment: Needs assistance ?Sitting-balance support: No upper  extremity supported, Feet supported ?Sitting balance-Leahy Scale: Good ?Sitting balance - Comments: close supervision ?  ?Standing balance support: Bilateral upper extremity supported, During functional activity ?Standing balance-Leahy Scale: Poor ?Standing balance comment: relies on RW and external supporrt ?  ?  ?  ?  ?  ?  ?  ?  ?  ?  ?  ?  ? ?  ?Cognition Arousal/Alertness: Awake/alert ?Behavior During Therapy: Flat affect ?Overall Cognitive Status: History of cognitive impairments - at baseline ?  ?  ?  ?  ?  ?  ?  ?  ?  ?  ?  ?  ?  ?  ?  ?  ?General Comments: pt following simple commands, decreased recall and attention but hx of dementia ?  ?  ? ?  ?Exercises   ? ?  ?General Comments   ?  ?  ? ?Pertinent Vitals/Pain Pain Assessment ?Pain Assessment: No/denies pain  ? ? ?Home Living   ?  ?  ?  ?  ?  ?  ?  ?  ?  ?   ?  ?Prior Function    ?  ?  ?   ? ?PT Goals (current goals can now be found in the care plan section) Acute Rehab PT Goals ?Patient Stated Goal: to go home ?Progress towards PT goals: Progressing toward goals ? ?  ?Frequency ? ? ? Min 2X/week ? ? ? ?  ?PT Plan Current plan remains appropriate  ? ? ?Co-evaluation   ?  ?  ?  ?  ? ?  ?AM-PAC PT "6 Clicks" Mobility   ?Outcome Measure ? Help needed turning from your back to your side while in a flat bed without using bedrails?: A Lot ?Help needed moving from lying on your back to sitting on the side of a flat bed without using bedrails?: A Lot ?Help needed moving to and from a bed to a chair (including a wheelchair)?: Total ?Help needed standing up from a chair using your arms (e.g., wheelchair or bedside chair)?: Total ?Help needed to walk in hospital room?: Total ?Help needed climbing 3-5 steps with a railing? : Total ?6 Click Score: 8 ? ?  ?End of Session Equipment Utilized During Treatment: Gait belt ?Activity Tolerance: Patient limited by fatigue ?Patient left: with call bell/phone within reach;in chair;with chair alarm set;with family/visitor  present ?Nurse Communication: Mobility status ?PT Visit Diagnosis: Other abnormalities of gait and mobility (R26.89);Muscle weakness (generalized) (M62.81);History of falling (Z91.81);Other symptoms and signs involving the nervous system (R29.898);Dizziness and giddiness (R42) ?  ? ? ?Time: 4235-3614 ?PT Time Calculation (min) (ACUTE ONLY): 51 min ? ?Charges:  $Gait Training: 8-22 mins ?$Therapeutic Activity: 8-22 mins ?$Canalith Rep Proc: 8-22 mins          ?          ? ?Embry Manrique M,PT ?Acute Rehab Services ?[phone] ?[phone] (pager)  ? ? ?Alvira Philips ?07/18/2021, 12:39 PM ? ?

## 2021-07-19 LAB — GLUCOSE, CAPILLARY
Glucose-Capillary: 92 mg/dL (ref 70–99)
Glucose-Capillary: 134 mg/dL — ABNORMAL HIGH (ref 70–99)
Glucose-Capillary: 88 mg/dL (ref 70–99)
Glucose-Capillary: 156 mg/dL — ABNORMAL HIGH (ref 70–99)

## 2021-07-19 MED ORDER — MECLIZINE HCL 12.5 MG PO TABS
12.5000 mg | ORAL_TABLET | Freq: Three times a day (TID) | ORAL | 0 refills | Status: DC | PRN
Start: 2021-07-19 — End: 2023-03-11

## 2021-07-19 MED ORDER — CEPHALEXIN 500 MG PO CAPS: 500.0000 mg | ORAL_CAPSULE | Freq: Two times a day (BID) | ORAL | 0 refills | Status: AC

## 2021-07-19 MED ORDER — CLOZAPINE 100 MG PO TABS: 150.0000 mg | ORAL_TABLET | Freq: Every day | ORAL | Status: DC

## 2021-07-19 MED ORDER — MIRTAZAPINE 15 MG PO TABS: 15.0000 mg | ORAL_TABLET | Freq: Every day | ORAL | Status: DC

## 2021-07-19 NOTE — Progress Notes (Signed)
Pt request 4 side rails up  ? ?

## 2021-07-19 NOTE — Discharge Summary (Addendum)
? ?Physician Discharge Summary  ?[name] VZD:[id number] DOB: Aug 14, 1940 DOA: 07/15/2021 ? ?PCP: [name], MD ? ?Admit date: 07/15/2021 ?Discharge date: 07/20/2021 ? ?Admitted From: ALF ?Disposition:  SNF ? ?Recommendations for Outpatient Follow-up:  ?Follow up with PCP in 1-2 weeks ?Please obtain BMP/CBC in one week ? ?Home Health: none ?Equipment/Devices: none ? ?Discharge Condition: stable ?CODE STATUS: Full code ?Diet recommendation: heart healthy ? ?HPI: Per admitting MD, ?81 year old female with past medical history of dementia, paranoid schizophrenia, hypertension, coronary artery disease, diastolic congestive heart failure (Echo 10/2017 EF 65-70% with G1DD), insulin-dependent diabetes mellitus type 2 who presents to [facility] emergency department via EMS from [facility] with complaints of vertigo, nausea and vomiting. Patient is a somewhat poor historian therefore portion of the history of and also been obtained from the daughter.  Approximately 3 or 4 days ago patient began to experience nausea and episodes of vomiting.  Patient denies any associated diarrhea.  There has been no associated abdominal ain, diarrhea, fever, recent travel or sick contacts. Patient has also been experiencing associated "dizziness" and generalized weakness over the same span of time.  As the days progressed patient has exhibited a poor appetite.  Due to persisting symptoms EMS was eventually contacted and patient was promptly brought into [facility] emergency department for evaluation. Upon evaluation in the emergency department patient was found to have a substantial leukocytosis of 15.1.  Urinalysis was found to be highly suggestive of urinary tract infection.  ER provider felt that patient's symptoms are secondary to a progressive urinary tract infection and placed the patient on intravenous fluids as well as intravenous ceftriaxone.  The hospitalist group was then called  to assess the patient for admission to the hospital. ? ?Hospital Course / Discharge diagnoses: ?Principal Problem: ?  Acute cystitis without hematuria ?Active Problems: ?  Vertigo ?  Nausea, vomiting, diarrhea and abdominal pain ?  Recurrent falls ?  Type 2 diabetes mellitus without complication, with long-term current use of insulin ([place]) ?  Coronary artery disease involving native coronary artery of native heart without angina pectoris ?  At risk for polypharmacy ?  Chronic diastolic CHF (congestive heart failure) ([place]) ?  Essential hypertension ?  GERD without esophagitis ?  Mixed diabetic hyperlipidemia associated with type 2 diabetes mellitus ([place]) ?  Paranoid schizophrenia ([place]) ?  Dementia without behavioral disturbance ([place]) ?  Abdominal pain ?  Acute cystitis ? ? ?Assessment and Plan: ?Principal problem ?Acute cystitis without hematuria -Patient is not a great historian, but admitted to UTI type symptoms on admission.  She also had leukocytosis.  Urinalysis was concerning for UTI and was placed on antibiotics.  Unfortunately urine culture appears to have been collected on 3/26 but it does not appear to have been sent, therefore no culture data is available. Received 3 days of ceftriaxone, changed to Keflex for 2 additional days ?  ?Active problems ?Recurrent falls -Reportedly had 2 fall incidents in the last 1 week.  Denies hitting head or loss of consciousness.  Could be due to UTI, vertigo, deconditioning. To go to stSNF ?Nausea, vomiting, diarrhea and abdominal pain -Due to UTI?    She has history of IBS. Abdominal exam benign. Could have gastroparesis from diabetes although fairly controlled. Resolved, eating a regular diet  ?Vertigo -?BPPV, She describes her dizziness as spinning. Neuro exam reassuring.  MRI brain without acute finding but moderate chronic small vessel ischemia with multiple chronic lacunar infarcts. Continue meclizine, gradually improved  ?  At risk for polypharmacy -Elderly patient on  clonidine, gabapentin, high-dose Remeron, reduce Remeron from 45 to 15 mg daily. Discontinued clonidine, reportedly not taking ?Coronary artery disease involving native coronary artery of native heart without angina pectoris -No cardiopulmonary symptoms.  Continue home medications ?Type 2 diabetes mellitus without complication, with long-term current use of insulin (HCC) -controlled, A1c less than 7. ?Chronic diastolic CHF (congestive heart failure) ([place]) -appears euvolemic on exam ?Essential hypertension -monitor, she no longer uses clonidine patch.  Continue Coreg, amlodipine, Avapro.  Blood pressure controlled ?GERD without esophagitis -Continue PPI ?Paranoid schizophrenia ([place]) -Stable.  Continue home Haldol and clozapine ?Dementia without behavioral disturbance ([place]) -Stable.  She is fully oriented but not a great historian. Continue home Aricept and Namenda ?  ?Sepsis ruled out ? ?Discharge Instructions ? ? ?Allergies as of 07/20/2021   ? ?   Reactions  ? Codeine Phosphate Other (See Comments)  ? Stomach upset  ? Dexilant [dexlansoprazole] Other (See Comments)  ? weakness  ? Fish Allergy Swelling  ? Glimepiride Other (See Comments)  ? Pt does not remember reaction  ? Glyburide Other (See Comments)  ? Pt does not remember reaction  ? Levofloxacin Nausea Only  ? Made her weak and nauseated  ? Shellfish Allergy Swelling  ? Lip swelling  ? Tramadol Other (See Comments)  ? Pt does not remember reaction  ? ?  ? ?  ?Medication List  ?  ? ?STOP taking these medications   ? ?cloNIDine 0.2 mg/24hr patch ?Commonly known as: CATAPRES - Dosed in mg/24 hr ?  ?dicyclomine 20 MG tablet ?Commonly known as: BENTYL ?  ?fluticasone 50 MCG/ACT nasal spray ?Commonly known as: FLONASE ?  ?furosemide 40 MG tablet ?Commonly known as: LASIX ?  ?insulin aspart 100 UNIT/ML injection ?Commonly known as: novoLOG ?  ?loratadine 10 MG tablet ?Commonly known as: CLARITIN ?  ?potassium chloride 8 MEQ tablet ?Commonly known as: KLOR-CON ?   ?ranitidine 150 MG capsule ?Commonly known as: ZANTAC ?  ? ?  ? ?TAKE these medications   ? ?amLODipine 10 MG tablet ?Commonly known as: NORVASC ?Take 10 mg by mouth daily. ?  ?aspirin EC 81 MG tablet ?Take 81 mg by mouth daily. Swallow whole. ?  ?atorvastatin 40 MG tablet ?Commonly known as: LIPITOR ?Take 40 mg by mouth daily. ?  ?AZO DINE MAXIMUM STRENGTH PO ?Take 1 tablet by mouth daily as needed (urinary pain). ?  ?AZO-CRANBERRY PO ?Take 1 tablet by mouth daily. ?  ?bismuth subsalicylate 161 WR/60AV suspension ?Commonly known as: PEPTO BISMOL ?Take 30 mLs by mouth every 6 (six) hours as needed for diarrhea or loose stools. ?  ?carvedilol 12.5 MG tablet ?Commonly known as: COREG ?Take 1 tablet (12.5 mg total) by mouth 2 (two) times daily with a meal. ?  ?cephALEXin 500 MG capsule ?Commonly known as: KEFLEX ?Take 1 capsule (500 mg total) by mouth every 12 (twelve) hours for 3 doses. ?  ?cetirizine 10 MG tablet ?Commonly known as: ZYRTEC ?Take 10 mg by mouth daily. ?  ?cloZAPine 100 MG tablet ?Commonly known as: CLOZARIL ?Take 1.5 tablets (150 mg total) by mouth at bedtime. ?  ?donepezil 10 MG tablet ?Commonly known as: ARICEPT ?Take 10 mg by mouth at bedtime. ?  ?esomeprazole 40 MG capsule ?Commonly known as: [place] ?Take 40 mg by mouth 2 (two) times daily. ?  ?famotidine 40 MG tablet ?Commonly known as: PEPCID ?Take 40 mg by mouth daily. ?  ?gabapentin 100 MG capsule ?Commonly known  as: NEURONTIN ?Take 100 mg by mouth at bedtime as needed (pain). ?  ?haloperidol 5 MG tablet ?Commonly known as: HALDOL ?Take 5-10 mg by mouth See admin instructions. '5mg'$  every morning, '10mg'$  at bedtime ?  ?insulin glargine 100 UNIT/ML injection ?Commonly known as: LANTUS ?Inject 0.08 mLs (8 Units total) into the skin at bedtime. ?What changed: how much to take ?  ?loperamide HCl 1 MG/7.5ML suspension ?Commonly known as: IMODIUM ?Take 2 mg by mouth daily as needed for diarrhea or loose stools. ?  ?meclizine 12.5 MG tablet ?Commonly  known as: ANTIVERT ?Take 1 tablet (12.5 mg total) by mouth 3 (three) times daily as needed for dizziness. ?  ?memantine 10 MG tablet ?Commonly known as: Namenda ?Take 1 tablet (10 mg total) by mouth 2 (two)

## 2021-07-19 NOTE — TOC Progression Note (Addendum)
Transition of Care (TOC) - Initial/Assessment Note  ? ? ?Patient Details  ?Name: [name] ?MRN: [id number] ?Date of Birth: 06/06/1940 ? ?Transition of Care (TOC) CM/SW Contact:    ?Paulene Floor Layten Aiken, LCSWA ?Phone Number: ?07/19/2021, 12:28 PM ? ?Clinical Narrative:                 ?CSW spoke with the patient's daughter/ caregiver Santiago Glad.  The family is in agreement with the patient going to a SNF after observing the patient's mobility during physical therapy today.  CSW presented bed offers and the family choose [facility].   ? ?CSW contacted [facility] and the facility can accept the patient today, pending insurance auth and family completing admissions paperwork prior to 1400. ? ?CSW requested that [facility] CMA begin insurance auth and contacted the family to request that a family member meet with admissions at [facility] to complete paperwork. ? ?Pending:insurance auth.   ? ?15:15: Insurance Josem Kaufmann has been received  Auth [id number]  ?3/30-4/03 next review date 4/3 ? ?The facility can accept the patient tomorrow.   ? ?Expected Discharge Plan: [place] ?Barriers to Discharge: Continued Medical Work up, Ship broker, SNF Pending bed offer ? ? ?Patient Goals and CMS Choice ?  ?CMS Medicare.gov Compare Post Acute Care list provided to:: Patient Represenative (must comment) ?Choice offered to / list presented to : Adult Children ? ?Expected Discharge Plan and Services ?Expected Discharge Plan: [place] ?  ?  ?  ?Living arrangements for the past 2 months: [place] ?                ?  ?  ?  ?  ?  ?  ?  ?  ?  ?  ? ?Prior Living Arrangements/Services ?Living arrangements for the past 2 months: [place] ?Lives with:: Facility Resident ?Patient language and need for interpreter reviewed:: Yes ?Do you feel safe going back to the place where you live?: Yes      ?Need for Family Participation in Patient Care: Yes (Comment) ?Care giver support  system in place?: Yes (comment) ?  ?Criminal Activity/Legal Involvement Pertinent to Current Situation/Hospitalization: No - Comment as needed ? ?Activities of Daily Living ?Home Assistive Devices/Equipment: Cane (specify quad or straight), Dentures (specify type), Eyeglasses, Walker (specify type) ?ADL Screening (condition at time of admission) ?Patient's cognitive ability adequate to safely complete daily activities?: Yes ?Is the patient deaf or have difficulty hearing?: No ?Does the patient have difficulty seeing, even when wearing glasses/contacts?: No ?Does the patient have difficulty concentrating, remembering, or making decisions?: Yes ?Patient able to express need for assistance with ADLs?: Yes ?Does the patient have difficulty dressing or bathing?: No ?Independently performs ADLs?: Yes (appropriate for developmental age) ?Does the patient have difficulty walking or climbing stairs?: Yes ?Weakness of Legs: None ?Weakness of Arms/Hands: None ? ?Permission Sought/Granted ?  ?Permission granted to share information with : Yes, Verbal Permission Granted ?   ? Permission granted to share info w AGENCY: SNF ?   ?   ? ?Emotional Assessment ?  ?  ?  ?Orientation: : Oriented to Self, Oriented to Place, Oriented to  Time, Oriented to Situation ?Alcohol / Substance Use: Not Applicable ?Psych Involvement: No (comment) ? ?Admission diagnosis:  Acute cystitis [N30.00] ?Dizziness [R42] ?Acute cystitis without hematuria [N30.00] ?Patient Active Problem List  ? Diagnosis Date Noted  ? Mixed diabetic hyperlipidemia associated with type 2 diabetes mellitus ([place]) 07/16/2021  ? Abdominal pain 07/16/2021  ? Recurrent  falls 07/16/2021  ? At risk for polypharmacy 07/16/2021  ? Acute cystitis 07/16/2021  ? Metabolic encephalopathy   ? Sepsis ([place])   ? Chronic diastolic CHF (congestive heart failure) ([place]) 11/22/2017  ? Breast cancer of upper-outer quadrant of left female breast ([place]) 08/29/2015  ? SIRS (systemic inflammatory  response syndrome) ([place]) 12/23/2014  ? Extrapyramidal disorder   ? DYSPHAGIA, PHARYNGOESOPHAGEAL PHASE 02/07/2009  ? Nausea, vomiting, diarrhea and abdominal pain 02/03/2009  ? TOBACCO USE, QUIT 02/03/2009  ? Vertigo 06/22/2008  ? Chronic back pain 10/26/2007  ? Acute cystitis without hematuria 06/30/2007  ? HYPERLIPIDEMIA 06/02/2007  ? Dementia without behavioral disturbance ([place]) 06/02/2007  ? Paranoid schizophrenia ([place]) 06/02/2007  ? PERIPHERAL VASCULAR DISEASE 06/02/2007  ? ALLERGIC RHINITIS 06/02/2007  ? IBS 06/02/2007  ? FATIGUE 06/02/2007  ? PERIPHERAL EDEMA 06/02/2007  ? Type 2 diabetes mellitus without complication, with long-term current use of insulin ([place]) 09/04/2006  ? ANEMIA-IRON DEFICIENCY 09/04/2006  ? ANXIETY 09/04/2006  ? PERIPHERAL NEUROPATHY 09/04/2006  ? Essential hypertension 09/04/2006  ? Coronary artery disease involving native coronary artery of native heart without angina pectoris 09/04/2006  ? GERD without esophagitis 09/04/2006  ? ?PCP:  [name], MD ?Pharmacy:   ?[facility] [address] ?[address] ?[place] [address] ?Phone: [phone] Fax: [phone] ? ? ? ? ?Social Determinants of Health (SDOH) Interventions ?  ? ?Readmission Risk Interventions ?   ? View : No data to display.  ?  ?  ?  ? ? ? ?

## 2021-07-19 NOTE — Progress Notes (Signed)
?PROGRESS NOTE ? ?[name] [id number]:[id number] DOB: 08-18-1940 DOA: 07/15/2021 ?PCP: [name], MD ? ? LOS: 3 days  ? ?Brief Narrative / Interim history: ?81 year old F with history of dementia, paranoid schizophrenia, CAD, diastolic CHF, IDDM-2, HTN and osteoarthritis brought to ED from ALF/ILF with vertigo, nausea, vomiting, poor p.o. intake and diarrhea, and admitted with acute cystitis.  She had leukocytosis to 15.1.  UA concerning for UTI.  She also had 2 fall incidents at ILF.  CT head, lumbar x-ray and MRI brain without acute finding.  ? ?Subjective / 24h Interval events: ?No chest pain, no dyspnea. ? ?Assesement and Plan: ?Principal Problem: ?  Acute cystitis without hematuria ?Active Problems: ?  Vertigo ?  Nausea, vomiting, diarrhea and abdominal pain ?  Recurrent falls ?  Type 2 diabetes mellitus without complication, with long-term current use of insulin ([place]) ?  Coronary artery disease involving native coronary artery of native heart without angina pectoris ?  At risk for polypharmacy ?  Chronic diastolic CHF (congestive heart failure) ([place]) ?  Essential hypertension ?  GERD without esophagitis ?  Mixed diabetic hyperlipidemia associated with type 2 diabetes mellitus ([place]) ?  Paranoid schizophrenia ([place]) ?  Dementia without behavioral disturbance ([place]) ?  Abdominal pain ?  Acute cystitis ? ? ?Assessment and Plan: ?Principal problem ?Acute cystitis without hematuria -Patient is not a great historian, but admitted to UTI type symptoms on admission.  She also had leukocytosis.  Urinalysis was concerning for UTI and was placed on antibiotics.  Unfortunately urine culture appears to have been collected on 3/26 but it does not appear to have been sent, therefore no culture data is available. Received 3 days of ceftriaxone, changed to Keflex for 2 additional days ?  ?Active problems ?Recurrent falls -Reportedly had 2 fall incidents in the last 1 week.  Denies hitting head or loss of consciousness.  Could  be due to UTI, vertigo, deconditioning. To go to stSNF ?Nausea, vomiting, diarrhea and abdominal pain -Due to UTI?    She has history of IBS. Abdominal exam benign. Could have gastroparesis from diabetes although fairly controlled. Resolved, eating a regular diet  ?Vertigo -?BPPV, She describes her dizziness as spinning. Neuro exam reassuring.  MRI brain without acute finding but moderate chronic small vessel ischemia with multiple chronic lacunar infarcts. Continue meclizine, gradually improved  ?At risk for polypharmacy -Elderly patient on clonidine, gabapentin, high-dose Remeron, reduce Remeron from 45 to 15 mg daily. Discontinued clonidine, reportedly not taking ?Coronary artery disease involving native coronary artery of native heart without angina pectoris -No cardiopulmonary symptoms.  Continue home medications ?Type 2 diabetes mellitus without complication, with long-term current use of insulin (HCC) -controlled, A1c less than 7. ? ?CBG (last 3)  ?Recent Labs  ?  07/18/21 ?2038 07/19/21 ?0723 07/19/21 ?1132  ?GLUCAP 160* 92 134*  ? ?Chronic diastolic CHF (congestive heart failure) (HCC) -appears euvolemic on exam ?Essential hypertension -monitor, she no longer uses clonidine patch.  Continue Coreg, amlodipine, Avapro.  Blood pressure controlled ?GERD without esophagitis -Continue PPI ?Paranoid schizophrenia ([place]) -Stable.  Continue home Haldol and clozapine ?Dementia without behavioral disturbance ([place]) -Stable.  She is fully oriented but not a great historian. Continue home Aricept and Namenda ? ? ? ?Scheduled Meds: ? amLODipine  10 mg Oral Daily  ? aspirin EC  81 mg Oral Daily  ? atorvastatin  40 mg Oral Daily  ? carvedilol  12.5 mg Oral BID WC  ? cephALEXin  500 mg Oral Q12H  ? cloZAPine  150 mg Oral QHS  ? donepezil  10 mg Oral QHS  ? enoxaparin (LOVENOX) injection  40 mg Subcutaneous Q24H  ? fluticasone  2 spray Each Nare Daily  ? haloperidol  10 mg Oral QHS  ? haloperidol  5 mg Oral Daily  ? insulin  aspart  0-15 Units Subcutaneous TID AC & HS  ? irbesartan  150 mg Oral Daily  ? loratadine  10 mg Oral Daily  ? meclizine  12.5 mg Oral TID  ? memantine  10 mg Oral BID  ? mirtazapine  15 mg Oral QHS  ? pantoprazole  40 mg Oral Daily  ? ?Continuous Infusions: ? sodium chloride Stopped (07/18/21 0127)  ? ?PRN Meds:.sodium chloride, acetaminophen **OR** acetaminophen, gabapentin, hydrALAZINE, ondansetron **OR** ondansetron (ZOFRAN) IV, polyethylene glycol ? ?Diet Orders (From admission, onward)  ? ?  Start     Ordered  ? 07/16/21 0210  Diet heart healthy/carb modified Room service appropriate? Yes; Fluid consistency: Thin  Diet effective now       ?Question Answer Comment  ?Diet-HS Snack? Nothing   ?Room service appropriate? Yes   ?Fluid consistency: Thin   ?  ? 07/16/21 0210  ? ?  ?  ? ?  ? ? ?DVT prophylaxis: enoxaparin (LOVENOX) injection 40 mg Start: 07/16/21 1000 ? ? ?Lab Results  ?Component Value Date  ? PLT 247 07/18/2021  ? ? ?  Code Status: Full Code ? ?Family Communication: Daughter at bedside ? ?Status is: Inpatient ? ?Remains inpatient appropriate because: insurance authorization ? ?Level of care: Telemetry Medical ? ?Consultants:  ?none ? ?Procedures:  ?none ? ?Microbiology  ?none ? ?Antimicrobials: ?Keflex   ? ? ?Objective: ?Vitals:  ? 07/18/21 1718 07/18/21 2037 07/19/21 0551 07/19/21 0913  ?BP: (!) 159/67 (!) 153/53 (!) 164/72 (!) 155/64  ?Pulse: 98 68 88 (!) 102  ?Resp: '17 18 18 17  '$ ?Temp: 99.2 ?F (37.3 ?C) 98.4 ?F (36.9 ?C) 97.9 ?F (36.6 ?C) 97.6 ?F (36.4 ?C)  ?TempSrc: Oral   Oral  ?SpO2: 97% 100% 97% 96%  ?Weight:      ?Height:      ? ? ?Intake/Output Summary (Last 24 hours) at 07/19/2021 1325 ?Last data filed at 07/19/2021 1215 ?Gross per 24 hour  ?Intake 680 ml  ?Output 350 ml  ?Net 330 ml  ? ? ?Wt Readings from Last 3 Encounters:  ?07/16/21 65.5 kg  ?11/27/17 67.3 kg  ?11/21/17 64 kg  ? ? ?Examination: ? ?Constitutional: NAD ?Respiratory: clear to auscultation bilaterally, no wheezing, no  crackles. Normal respiratory effort.  ?Cardiovascular: Regular rate and rhythm, no murmurs / rubs / gallops. No LE edema. ? ? ?Data Reviewed: I have independently reviewed following labs and imaging studies ? ?CBC ?Recent Labs  ?Lab 07/15/21 ?1732 07/16/21 ?0422 07/17/21 ?0455 07/18/21 ?0401  ?WBC 15.1* 12.3* 10.2 11.5*  ?HGB 11.0* 10.5* 9.4* 9.8*  ?HCT 32.8* 32.8* 29.8* 29.8*  ?PLT 250 227 234 247  ?MCV 93.2 96.5 95.5 94.3  ?MCH 31.3 30.9 30.1 31.0  ?MCHC 33.5 32.0 31.5 32.9  ?RDW 15.1 15.4 15.9* 16.1*  ?LYMPHSABS 2.5 1.9  --   --   ?MONOABS 1.4* 1.0  --   --   ?EOSABS 0.0 0.0  --   --   ?BASOSABS 0.0 0.0  --   --   ? ? ? ?Recent Labs  ?Lab 07/15/21 ?1732 07/16/21 ?0228 07/16/21 ?0422 07/17/21 ?1829 07/18/21 ?0401  ?NA 144  --  145 144 140  ?K 3.3*  --  3.5 3.4* 4.9  ?CL 110  --  116* 116* 111  ?CO2 25  --  22 22 21*  ?GLUCOSE 124*  --  157* 88 132*  ?BUN 9  --  7* <5* 14  ?CREATININE 0.83  --  0.86 1.02* 1.14*  ?CALCIUM 8.6*  --  8.1* 8.3* 8.5*  ?AST 27  --  29  --   --   ?ALT 21  --  23  --   --   ?ALKPHOS 85  --  74  --   --   ?BILITOT 0.5  --  0.4  --   --   ?ALBUMIN 2.3*  --  2.0* 1.8* 1.9*  ?MG 1.9  --  1.7 1.7 2.0  ?LATICACIDVEN  --  1.7  --   --   --   ?HGBA1C  --   --  5.9*  --   --   ? ? ? ?------------------------------------------------------------------------------------------------------------------ ?No results for input(s): CHOL, HDL, LDLCALC, TRIG, CHOLHDL, LDLDIRECT in the last 72 hours. ? ?Lab Results  ?Component Value Date  ? HGBA1C 5.9 (H) 07/16/2021  ? ?------------------------------------------------------------------------------------------------------------------ ?No results for input(s): TSH, T4TOTAL, T3FREE, THYROIDAB in the last 72 hours. ? ?Invalid input(s): FREET3 ? ?Cardiac Enzymes ?No results for input(s): CKMB, TROPONINI, MYOGLOBIN in the last 168 hours. ? ?Invalid input(s):  CK ?------------------------------------------------------------------------------------------------------------------ ?   ?Component Value Date/Time  ? BNP 143.0 (H) 12/10/2014 2311  ? ? ?CBG: ?Recent Labs  ?Lab 07/18/21 ?1141 07/18/21 ?1642 07/18/21 ?2038 07/19/21 ?0723 07/19/21 ?1132  ?GLUCAP 145* 144* 160* 92 134*

## 2021-07-19 NOTE — Progress Notes (Signed)
Physical Therapy Treatment ?Patient Details ?Name: [name] ?MRN: [id number] ?DOB: 05-14-1940 ?Today's Date: 07/19/2021 ? ? ?History of Present Illness Pt is an 81 yo female presenting to the ED 07/15/21 with dizziness, N/V, and multiple recent falls. PMH includes dementia, DM2, CHF, schizophrenia, breast CA, HTN, PVD, GERD, CKD, and IBS. ? ?  ?PT Comments  ? ? During the session pt's horizontal canal and posterior canal were tested for BPPV bilaterally. BPPV testing was negative today. She recently started taking meclozine potentially masking any signs and symptoms of vestibular impairment. Pt reported light headedness upon sitting up/standing up that persisted throughout the session but reported that it was different from the "room spinning" feeling that she usually gets. She stood with mod to max assist and ambulated with mod assist +2 for safety with cues for foot placement and walker placement. During treatment session patient showed deficits in strength, endurance, activity tolerance, and balance. Recommending therapy services at skilled nursing facility to address the previously stated deficits. Will continue to follow acutely to maximize functional mobility, independence and safety. ?   ?Recommendations for follow up therapy are one component of a multi-disciplinary discharge planning process, led by the attending physician.  Recommendations may be updated based on patient status, additional functional criteria and insurance authorization. ? ?Follow Up Recommendations ? Skilled nursing-short term rehab (<3 hours/day) ?  ?  ?Assistance Recommended at Discharge Frequent or constant Supervision/Assistance  ?Patient can return home with the following Assistance with cooking/housework;Direct supervision/assist for medications management;Direct supervision/assist for financial management;Assist for transportation;Help with stairs or ramp for entrance;A lot of help with walking and/or transfers;A lot of help with  bathing/dressing/bathroom ?  ?Equipment Recommendations ? Wheelchair (measurements PT);Wheelchair cushion (measurements PT)  ?  ?Recommendations for Other Services   ? ? ?  ?Precautions / Restrictions Precautions ?Precautions: Fall ?Precaution Comments: watch BP ?Restrictions ?Weight Bearing Restrictions: No  ?  ? ?Mobility ? Bed Mobility ?Overal bed mobility: Needs Assistance ?Bed Mobility: Supine to Sit, Sit to Supine ?  ?  ?Supine to sit: Max assist ?Sit to supine: +2 for safety/equipment, +2 for physical assistance, Mod assist ?  ?General bed mobility comments: Pt required simple cuing for bed mobility and required physical assistance to roll and sit up. Assistance was provided getting back into bed with managing her legs and trunk. ?  ? ?Transfers ?Overall transfer level: Needs assistance ?Equipment used: Rolling walker (2 wheels) ?Transfers: Sit to/from Stand ?Sit to Stand: Mod assist, From elevated surface ?  ?  ?  ?  ?  ?General transfer comment: Pt required mod to max assist for standing. She required heavy use of her arms on bed, arm rest, or walker to do so. ?  ? ?Ambulation/Gait ?Ambulation/Gait assistance: Mod assist, +2 safety/equipment ?Gait Distance (Feet): 8 Feet ?Assistive device: Rolling walker (2 wheels) ?Gait Pattern/deviations: Decreased stride length, Step-to pattern, Shuffle, Leaning posteriorly, Drifts right/left, Trunk flexed, Narrow base of support ?  ?  ?  ?General Gait Details: Pt needed mod assist for upright stability with gait. Pt with flexed posture and needs cuing for all aspects of ambulation. Needed assist for moving RW. Needed cues to take large steps as she shuffles and keeps narrow BOS unless cued. ? ? ?Stairs ?  ?  ?  ?  ?  ? ? ?Wheelchair Mobility ?  ? ?Modified Rankin (Stroke Patients Only) ?  ? ? ?  ?Balance Overall balance assessment: Needs assistance ?Sitting-balance support: No upper extremity supported, Feet supported ?Sitting balance-Leahy Scale:  Good ?Sitting balance  - Comments: Pt required up to moderate assistance with sitting balance, at times was able to sit close supervision. ?Postural control: Right lateral lean ?Standing balance support: Bilateral upper extremity supported, During functional activity ?Standing balance-Leahy Scale: Poor ?Standing balance comment: relies on RW and external support. requires cuing to maintain up right posture. ?  ?  ?  ?  ?  ?  ?  ?  ?  ?  ?  ?  ? ?  ?Cognition Arousal/Alertness: Awake/alert ?Behavior During Therapy: Flat affect ?Overall Cognitive Status: History of cognitive impairments - at baseline ?  ?  ?  ?  ?  ?  ?  ?  ?  ?  ?  ?  ?  ?  ?  ?  ?General Comments: pt was slow to process questions and commands. ?  ?  ? ?  ?Exercises   ? ?  ?General Comments General comments (skin integrity, edema, etc.): Pt's daughter/care taker was present during session and was educated on impairments and recommendation. throughout the session the pt described her legs as heavy, this feeling did not change with postition or with activity. Pt's vitals were monitored throughout the session; HR in the 120's in sitting and 130 with short bout of ambulation; SBP was 180 throughout the session. ?  ?  ? ?Pertinent Vitals/Pain Pain Assessment ?Pain Assessment: No/denies pain  ? ? ?Home Living   ?  ?  ?  ?  ?  ?  ?  ?  ?  ?   ?  ?Prior Function    ?  ?  ?   ? ?PT Goals (current goals can now be found in the care plan section)   ? ?  ?Frequency ? ? ? Min 2X/week ? ? ? ?  ?PT Plan    ? ? ?Co-evaluation   ?  ?  ?  ?  ? ?  ?AM-PAC PT "6 Clicks" Mobility   ?Outcome Measure ? Help needed turning from your back to your side while in a flat bed without using bedrails?: A Lot ?Help needed moving from lying on your back to sitting on the side of a flat bed without using bedrails?: A Lot ?Help needed moving to and from a bed to a chair (including a wheelchair)?: Total ?Help needed standing up from a chair using your arms (e.g., wheelchair or bedside chair)?: Total ?Help  needed to walk in hospital room?: Total ?Help needed climbing 3-5 steps with a railing? : Total ?6 Click Score: 8 ? ?  ?End of Session Equipment Utilized During Treatment: Gait belt ?Activity Tolerance: Patient limited by fatigue ?Patient left: with call bell/phone within reach;with family/visitor present;in bed;with bed alarm set ?Nurse Communication: Mobility status;Other (comment) (Vitals during mobility) ?PT Visit Diagnosis: Other abnormalities of gait and mobility (R26.89);Muscle weakness (generalized) (M62.81);History of falling (Z91.81);Other symptoms and signs involving the nervous system (R29.898);Dizziness and giddiness (R42) ?  ? ? ?Time: 1610-9604 ?PT Time Calculation (min) (ACUTE ONLY): 44 min ? ?Charges:  $Gait Training: 8-22 mins ?$Therapeutic Activity: 8-22 mins ?$Neuromuscular Re-education: 8-22 mins          ?          ? ?Quenton Fetter, SPT ? ? ? ?Quenton Fetter ?07/19/2021, 1:45 PM ? ?

## 2021-07-19 NOTE — Care Management Important Message (Signed)
Important Message ? ?Patient Details  ?Name: [name] ?MRN: [id number] ?Date of Birth: March 18, 1941 ? ? ?Medicare Important Message Given:  Yes ? ? ? ? ?Devan Danzer ?07/19/2021, 2:30 PM ?

## 2021-07-20 DIAGNOSIS — I5030 Unspecified diastolic (congestive) heart failure: Secondary | ICD-10-CM | POA: Diagnosis not present

## 2021-07-20 DIAGNOSIS — R296 Repeated falls: Secondary | ICD-10-CM | POA: Diagnosis not present

## 2021-07-20 DIAGNOSIS — R42 Dizziness and giddiness: Secondary | ICD-10-CM | POA: Diagnosis not present

## 2021-07-20 DIAGNOSIS — R109 Unspecified abdominal pain: Secondary | ICD-10-CM | POA: Diagnosis not present

## 2021-07-20 DIAGNOSIS — E785 Hyperlipidemia, unspecified: Secondary | ICD-10-CM | POA: Diagnosis not present

## 2021-07-20 DIAGNOSIS — E1140 Type 2 diabetes mellitus with diabetic neuropathy, unspecified: Secondary | ICD-10-CM | POA: Diagnosis not present

## 2021-07-20 DIAGNOSIS — Z794 Long term (current) use of insulin: Secondary | ICD-10-CM | POA: Diagnosis not present

## 2021-07-20 DIAGNOSIS — I739 Peripheral vascular disease, unspecified: Secondary | ICD-10-CM | POA: Diagnosis not present

## 2021-07-20 DIAGNOSIS — R2681 Unsteadiness on feet: Secondary | ICD-10-CM | POA: Diagnosis not present

## 2021-07-20 DIAGNOSIS — Z7401 Bed confinement status: Secondary | ICD-10-CM | POA: Diagnosis not present

## 2021-07-20 DIAGNOSIS — M6281 Muscle weakness (generalized): Secondary | ICD-10-CM | POA: Diagnosis not present

## 2021-07-20 DIAGNOSIS — N3000 Acute cystitis without hematuria: Secondary | ICD-10-CM | POA: Diagnosis not present

## 2021-07-20 DIAGNOSIS — I10 Essential (primary) hypertension: Secondary | ICD-10-CM | POA: Diagnosis not present

## 2021-07-20 DIAGNOSIS — E119 Type 2 diabetes mellitus without complications: Secondary | ICD-10-CM | POA: Diagnosis not present

## 2021-07-20 DIAGNOSIS — R1312 Dysphagia, oropharyngeal phase: Secondary | ICD-10-CM | POA: Diagnosis not present

## 2021-07-20 DIAGNOSIS — D509 Iron deficiency anemia, unspecified: Secondary | ICD-10-CM | POA: Diagnosis not present

## 2021-07-20 DIAGNOSIS — R6889 Other general symptoms and signs: Secondary | ICD-10-CM | POA: Diagnosis not present

## 2021-07-20 DIAGNOSIS — M545 Low back pain, unspecified: Secondary | ICD-10-CM | POA: Diagnosis not present

## 2021-07-20 DIAGNOSIS — R269 Unspecified abnormalities of gait and mobility: Secondary | ICD-10-CM | POA: Diagnosis not present

## 2021-07-20 DIAGNOSIS — Z743 Need for continuous supervision: Secondary | ICD-10-CM | POA: Diagnosis not present

## 2021-07-20 DIAGNOSIS — I5032 Chronic diastolic (congestive) heart failure: Secondary | ICD-10-CM | POA: Diagnosis not present

## 2021-07-20 DIAGNOSIS — I2510 Atherosclerotic heart disease of native coronary artery without angina pectoris: Secondary | ICD-10-CM | POA: Diagnosis not present

## 2021-07-20 DIAGNOSIS — M5137 Other intervertebral disc degeneration, lumbosacral region: Secondary | ICD-10-CM | POA: Diagnosis not present

## 2021-07-20 DIAGNOSIS — E440 Moderate protein-calorie malnutrition: Secondary | ICD-10-CM | POA: Diagnosis not present

## 2021-07-20 DIAGNOSIS — A419 Sepsis, unspecified organism: Secondary | ICD-10-CM | POA: Diagnosis not present

## 2021-07-20 DIAGNOSIS — N390 Urinary tract infection, site not specified: Secondary | ICD-10-CM | POA: Diagnosis not present

## 2021-07-20 DIAGNOSIS — M6259 Muscle wasting and atrophy, not elsewhere classified, multiple sites: Secondary | ICD-10-CM | POA: Diagnosis not present

## 2021-07-20 DIAGNOSIS — G629 Polyneuropathy, unspecified: Secondary | ICD-10-CM | POA: Diagnosis not present

## 2021-07-20 DIAGNOSIS — K219 Gastro-esophageal reflux disease without esophagitis: Secondary | ICD-10-CM | POA: Diagnosis not present

## 2021-07-20 LAB — GLUCOSE, CAPILLARY
Glucose-Capillary: 89 mg/dL (ref 70–99)
Glucose-Capillary: 195 mg/dL — ABNORMAL HIGH (ref 70–99)

## 2021-07-20 NOTE — TOC Transition Note (Signed)
Transition of Care (TOC) - CM/SW Discharge Note ? ? ?Patient Details  ?Name: [name] ?MRN: [id number] ?Date of Birth: [birth year] ? ?Transition of Care (TOC) CM/SW Contact:  ?Paulene Floor Tiphany Fayson, LCSWA ?Phone Number: ?07/20/2021, 9:20 AM ? ? ?Clinical Narrative:    ?Patient will DC to:  [facility] ?Anticipated DC date:  07/20/2021 ?Family notified: Yes ?Transport by: Corey Harold ? ? ?Per MD patient ready for DC to SNF. RN to call report prior to discharge [phone] room 3213. RN, patient, patient's family, and facility notified of DC. Discharge Summary and FL2 sent to facility. DC packet on chart. Ambulance transport will be requested for patient.  ? ?CSW will sign off for now as social work intervention is no longer needed. Please consult [place] again if new needs arise. ?  ? ? ?Final next level of care: [facility] ?Barriers to Discharge: Barriers Resolved ? ? ?Patient Goals and CMS Choice ?  ?CMS Medicare.gov Compare Post Acute Care list provided to:: Patient Represenative (must comment) ?Choice offered to / list presented to : Adult Children ? ?Discharge Placement ?  ?           ?Patient chooses bed at: [facility] ?Patient to be transferred to facility by: [facility] ?Name of family member notified: [name] (Daughter)   [phone] ?Patient and family notified of of transfer: 07/20/21 ? ?Discharge Plan and Services ?  ?  ?           ?  ?  ?  ?  ?  ?  ?  ?  ?  ?  ? ?Social Determinants of Health (SDOH) Interventions ?  ? ? ?Readmission Risk Interventions ?   ? View : No data to display.  ?  ?  ?  ? ? ? ? ? ?

## 2021-07-20 NOTE — Progress Notes (Signed)
Nursing report called to [facility] not successful.  ?

## 2021-07-20 NOTE — Progress Notes (Signed)
Nursing report given to [facility] nurse stephanie.  ?

## 2021-07-20 NOTE — Progress Notes (Signed)
DISCHARGE NOTE SNF ?[facility] to be discharged Skilled nursing facility per MD order. Patient verbalized understanding. ? ?Skin clean, dry and intact without evidence of skin break down, no evidence of skin tears noted. IV catheter discontinued intact. Site without signs and symptoms of complications. Dressing and pressure applied. Pt denies pain at the site currently. No complaints noted. ? ?Patient free of lines, drains, and wounds.  ? ?Discharge packet assembled. An After Visit Summary (AVS) was printed and given to the EMS personnel. Patient escorted via stretcher and discharged to [facility] via ambulance. Report called to accepting facility; all questions and concerns addressed.  ? ?Emarion Toral S Hale Chalfin, RN ?_______________________________________________________________________  ?

## 2021-07-21 LAB — CULTURE, BLOOD (ROUTINE X 2): Culture: NO GROWTH

## 2021-07-22 DIAGNOSIS — I10 Essential (primary) hypertension: Secondary | ICD-10-CM | POA: Diagnosis not present

## 2021-07-22 DIAGNOSIS — I5030 Unspecified diastolic (congestive) heart failure: Secondary | ICD-10-CM | POA: Diagnosis not present

## 2021-07-22 DIAGNOSIS — R42 Dizziness and giddiness: Secondary | ICD-10-CM | POA: Diagnosis not present

## 2021-07-22 DIAGNOSIS — E1140 Type 2 diabetes mellitus with diabetic neuropathy, unspecified: Secondary | ICD-10-CM | POA: Diagnosis not present

## 2021-07-22 DIAGNOSIS — R269 Unspecified abnormalities of gait and mobility: Secondary | ICD-10-CM | POA: Diagnosis not present

## 2021-07-22 DIAGNOSIS — N390 Urinary tract infection, site not specified: Secondary | ICD-10-CM | POA: Diagnosis not present

## 2021-07-23 DIAGNOSIS — K219 Gastro-esophageal reflux disease without esophagitis: Secondary | ICD-10-CM | POA: Diagnosis not present

## 2021-07-23 DIAGNOSIS — I2510 Atherosclerotic heart disease of native coronary artery without angina pectoris: Secondary | ICD-10-CM | POA: Diagnosis not present

## 2021-07-23 DIAGNOSIS — R296 Repeated falls: Secondary | ICD-10-CM | POA: Diagnosis not present

## 2021-07-23 DIAGNOSIS — G629 Polyneuropathy, unspecified: Secondary | ICD-10-CM | POA: Diagnosis not present

## 2021-07-23 DIAGNOSIS — N3000 Acute cystitis without hematuria: Secondary | ICD-10-CM | POA: Diagnosis not present

## 2021-07-23 DIAGNOSIS — I5032 Chronic diastolic (congestive) heart failure: Secondary | ICD-10-CM | POA: Diagnosis not present

## 2021-07-23 DIAGNOSIS — E785 Hyperlipidemia, unspecified: Secondary | ICD-10-CM | POA: Diagnosis not present

## 2021-07-23 DIAGNOSIS — E119 Type 2 diabetes mellitus without complications: Secondary | ICD-10-CM | POA: Diagnosis not present

## 2021-07-23 DIAGNOSIS — I10 Essential (primary) hypertension: Secondary | ICD-10-CM | POA: Diagnosis not present

## 2021-07-23 DIAGNOSIS — D509 Iron deficiency anemia, unspecified: Secondary | ICD-10-CM | POA: Diagnosis not present

## 2021-07-25 ENCOUNTER — Other Ambulatory Visit: Payer: Self-pay | Admitting: *Deleted

## 2021-07-25 NOTE — Patient Outreach (Signed)
[facility] Post- Acute Care Coordinator follow up. Per [place] eligible member currently resides in [facility].  Screening for potential [facility] Care Management services as a benefit of member's insurance plan. ? ?[name] admitted to SNF on 07/20/21 after hospitalization. ? ?Facility site visit to [facility] skilled nursing facility. Went to [facility]. However, she was off the unit. Made SNF SW aware writer is following for transition plans and [facility] needs.  ? ?Will continue to follow.  ? ? ?Marthenia Rolling, MSN, RN,BSN ?[place] Coordinator ?[phone] [facility]) ?[phone]  (Toll free office)  ?  ?

## 2021-07-30 DIAGNOSIS — D509 Iron deficiency anemia, unspecified: Secondary | ICD-10-CM | POA: Diagnosis not present

## 2021-07-30 DIAGNOSIS — I10 Essential (primary) hypertension: Secondary | ICD-10-CM | POA: Diagnosis not present

## 2021-07-30 DIAGNOSIS — N390 Urinary tract infection, site not specified: Secondary | ICD-10-CM | POA: Diagnosis not present

## 2021-07-30 DIAGNOSIS — I5032 Chronic diastolic (congestive) heart failure: Secondary | ICD-10-CM | POA: Diagnosis not present

## 2021-07-31 DIAGNOSIS — I5032 Chronic diastolic (congestive) heart failure: Secondary | ICD-10-CM | POA: Diagnosis not present

## 2021-07-31 DIAGNOSIS — M545 Low back pain, unspecified: Secondary | ICD-10-CM | POA: Diagnosis not present

## 2021-07-31 DIAGNOSIS — N390 Urinary tract infection, site not specified: Secondary | ICD-10-CM | POA: Diagnosis not present

## 2021-07-31 DIAGNOSIS — I10 Essential (primary) hypertension: Secondary | ICD-10-CM | POA: Diagnosis not present

## 2021-08-02 DIAGNOSIS — I5032 Chronic diastolic (congestive) heart failure: Secondary | ICD-10-CM | POA: Diagnosis not present

## 2021-08-02 DIAGNOSIS — N390 Urinary tract infection, site not specified: Secondary | ICD-10-CM | POA: Diagnosis not present

## 2021-08-02 DIAGNOSIS — M545 Low back pain, unspecified: Secondary | ICD-10-CM | POA: Diagnosis not present

## 2021-08-02 DIAGNOSIS — I10 Essential (primary) hypertension: Secondary | ICD-10-CM | POA: Diagnosis not present

## 2021-08-03 ENCOUNTER — Encounter: Payer: Self-pay | Admitting: Family Medicine

## 2021-08-03 ENCOUNTER — Non-Acute Institutional Stay: Payer: Medicare Other | Admitting: Family Medicine

## 2021-08-03 VITALS — BP 132/50 | HR 72 | Resp 18

## 2021-08-03 DIAGNOSIS — E119 Type 2 diabetes mellitus without complications: Secondary | ICD-10-CM | POA: Diagnosis not present

## 2021-08-03 DIAGNOSIS — R109 Unspecified abdominal pain: Secondary | ICD-10-CM | POA: Diagnosis not present

## 2021-08-03 DIAGNOSIS — R296 Repeated falls: Secondary | ICD-10-CM | POA: Diagnosis not present

## 2021-08-03 DIAGNOSIS — Z794 Long term (current) use of insulin: Secondary | ICD-10-CM | POA: Diagnosis not present

## 2021-08-03 DIAGNOSIS — N390 Urinary tract infection, site not specified: Secondary | ICD-10-CM | POA: Diagnosis not present

## 2021-08-03 NOTE — Progress Notes (Signed)
? ? ?Manufacturing engineer ?Community Palliative Care Consult Note ?Telephone: [phone]  ?Fax: [phone]  ? ?Date of encounter: 08/03/21 ?1:53 PM ?PATIENT NAME: Courtland Coppa ?[place] [address]   ?[phone] (home)  ?DOB: Apr 28, 1940 ?MRN: [id number] ?PRIMARY CARE PROVIDER:    ?[name], MD,  ?[address] ?[place] [address] ?[phone] ? ?REFERRING PROVIDER:   ?[name], MD ?[address] ?[address] ?[place],  [place] [address] ?[phone] ? ?RESPONSIBLE PARTY:    ?Contact Information   ? ? Name Relation Home Work Mobile  ? Ruiz,[name] Daughter [phone]  [phone]  ? Diver,Valarie Daughter [phone]  ? Dawkins,Doreen Daughter [phone]  [phone]  ? Lesane,Tammie Daughter   [phone]  ? Hosley,Tasha/Darryl    [phone]  ? ?  ? ? ? ?I met face to face with patient in skilled nursing facility. Palliative Care was asked to follow this patient by consultation request of  Koirala, Dibas, MD to address advance care planning and complex medical decision making. This is the initial visit.  ? ? ?      ASSESSMENT, SYMPTOM MANAGEMENT AND PLAN / RECOMMENDATIONS:  ? Recurrent falls ?Recommend home health home PT safety eval and evaluation for any needed DME if pt agreeable. ?Resume Palliative care under home Palliative Care NP-Livina Eshiet NP ? ? ? Type 2 IDDM ?Monitor blood sugars BID, continue Lantus 8 units QHS. ?If having blood sugars routinely < 80, likely need to either adjust Lantus dose or eliminate and continue monitoring. ?HGB A1c too tightly controlled at present. ? ?Follow up Palliative Care Visit: Palliative care will continue to follow for complex medical decision making, advance care planning, and clarification of goals. Return 4 weeks or prn. ? ? ? ?This visit was coded based on medical decision making (MDM). ? ?PPS: 50% ? ?HOSPICE ELIGIBILITY/DIAGNOSIS: TBD ?Chief Complaint:   ?[facility] was previously following this patient at home and received a referral to follow up with patient for chronic disease management after admission to SNF rehab. ? ?HISTORY OF PRESENT ILLNESS:  Kaitlan Bin is a 81 y.o. year old female with Dementia and hx of stroke, HTN, CAD, PVD, chronic diastolic CHF, allergic rhinitis, IBS, hx of pharyngoesophageal dysphagia, Type 2 DM on insulin, HLD, anemia, paranoid schizophrenia, chronic back pain and edema with hx of sepsis and breact cancer of left breast. She had been having recurrent falls.  Pt states she has right sided weakness, leans to the right side and thought she had a stroke but was told she didn't.  She states she is being d/c'd from SNF rehab to home tomorrow and needs a basic wheelchair for home use.  She lives alone but her daughter [name] comes in the am every day to fix her breakfast and lunch then has a friend who comes in the evening that fixes her dinner and helps her into bed.  Pt states she can walk a little from chair to the bathroom and transfer to bed. She states being told she was constipated but has had 4 days of loose stools. On 07/18/21 pt had elevated WBC 11.5 with low HGB 9.8 (down from baseline 11.0).  07/16/21 CMP with eleevated CL 116, NA ULN @ 145, Albumin low at 2.0, Cr normal at 0.86 with low BUN 7 and low Calcium 8.1.  CT head 07/15/21 No acute stroke.  Mild low-density changes within the periventricular and subcortical white matter compatible with chronic microvascular ischemic change. Mild-moderate  diffuse cerebral volume loss. MRI brain/head 07/16/21 No acute intracranial abnormality.  Moderate chronic small vessel ischemia with multiple chronic lacunar infarcts. CXR 07/15/21 with linear densities in BLL suggestive of scarring or subsegmental atelectasis without pulmonary edema.  Mild-moderate diffuse cerebral volume loss. 07/16/21 HGB A1c 5.9%, cbgs in hospital 80s-160s. Noted UA with positive  nitrites, large leukocytes and many bacteria. Urine culture collected but not seen. Received 3 days of Ceftriaxone IV and 2 days of oral Keflex. (Echo 10/2017 EF 65-70% with G1DD). On lantus 8 units at HS. ?Spoke with Brien Mates, SW for [facility] who states that pt is not being d/c'd to home tomorrow but on Monday and that family does not want her to be using a WC.   ? ?History obtained from review of EMR, discussion with facility staff and/or Ms. Speas.  ?I reviewed available labs, medications, imaging, studies and related documents from the EMR.  Records reviewed and summarized above.  ? ?ROS ?General: NAD ?EYES: denies vision changes ?ENMT: endorses coughing with cold fluids, no choking ?Cardiovascular: denies chest pain, denies DOE ?Pulmonary: denies cough, denies increased SOB ?Abdomen: endorses good appetite, endorses continence of bowel with 4 days of loose liquid Bms being told she was constipated ?GU: denies dysuria, endorses urge incontinence of urine intermittently ?MSK:  endorses increased right side weakness, multiple falls reported, using WC for mobility ?Skin: denies rashes or wounds ?Neurological: denies pain, denies insomnia ?Psych: Endorses positive mood ?Heme/lymph/immuno: denies bruises, abnormal bleeding ? ?Physical Exam: ?Current and past weights: 144 lbs 6.4 ounces as of 07/16/21 ?Constitutional: NAD ?General: frail appearing ?EYES: anicteric sclera, lids intact, no discharge  ?ENMT: intact hearing, oral mucous membranes moist, dentition in various stages of repair ?CV: S1S2, RRR, 1+ bilat ankle edema ?Pulmonary: CTAB, no increased work of breathing, no cough, room air ?Abdomen: normo-active BS + 4 quadrants, soft and non tender, no ascites ?GU: deferred ?MSK:  Moves all extremities ?Skin: warm and dry, no rashes or wounds on visible skin ?Neuro:  no generalized weakness, some short term memory deficits noted ?Psych: moderately anxious affect, A and O x 2-3 ?Hem/lymph/immuno: no widespread  bruising ? ?CURRENT PROBLEM LIST:  ?Patient Active Problem List  ? Diagnosis Date Noted  ? Mixed diabetic hyperlipidemia associated with type 2 diabetes mellitus ([place]) 07/16/2021  ? Abdominal pain 07/16/2021  ? Recurrent falls 07/16/2021  ? At risk for polypharmacy 07/16/2021  ? Acute cystitis 07/16/2021  ? Metabolic encephalopathy   ? Sepsis ([place])   ? Chronic diastolic CHF (congestive heart failure) ([place]) 11/22/2017  ? Breast cancer of upper-outer quadrant of left female breast ([place]) 08/29/2015  ? SIRS (systemic inflammatory response syndrome) ([place]) 12/23/2014  ? Extrapyramidal disorder   ? DYSPHAGIA, PHARYNGOESOPHAGEAL PHASE 02/07/2009  ? Nausea, vomiting, diarrhea and abdominal pain 02/03/2009  ? TOBACCO USE, QUIT 02/03/2009  ? Vertigo 06/22/2008  ? Chronic back pain 10/26/2007  ? Acute cystitis without hematuria 06/30/2007  ? HYPERLIPIDEMIA 06/02/2007  ? Dementia without behavioral disturbance ([place]) 06/02/2007  ? Paranoid schizophrenia ([place]) 06/02/2007  ? PERIPHERAL VASCULAR DISEASE 06/02/2007  ? ALLERGIC RHINITIS 06/02/2007  ? IBS 06/02/2007  ? FATIGUE 06/02/2007  ? PERIPHERAL EDEMA 06/02/2007  ? Type 2 diabetes mellitus without complication, with long-term current use of insulin ([place]) 09/04/2006  ? ANEMIA-IRON DEFICIENCY 09/04/2006  ? ANXIETY 09/04/2006  ? PERIPHERAL NEUROPATHY 09/04/2006  ? Essential hypertension 09/04/2006  ? Coronary artery disease involving native coronary artery of native heart without angina pectoris 09/04/2006  ? GERD without esophagitis 09/04/2006  ? ?  PAST MEDICAL HISTORY:  ?Active Ambulatory Problems  ?  Diagnosis Date Noted  ? Type 2 diabetes mellitus without complication, with long-term current use of insulin ([place]) 09/04/2006  ? HYPERLIPIDEMIA 06/02/2007  ? ANEMIA-IRON DEFICIENCY 09/04/2006  ? Dementia without behavioral disturbance ([place]) 06/02/2007  ? Paranoid schizophrenia ([place]) 06/02/2007  ? ANXIETY 09/04/2006  ? PERIPHERAL NEUROPATHY 09/04/2006  ? Essential hypertension  09/04/2006  ? Coronary artery disease involving native coronary artery of native heart without angina pectoris 09/04/2006  ? PERIPHERAL VASCULAR DISEASE 06/02/2007  ? ALLERGIC RHINITIS 06/02/2007  ? GERD without esophagitis 05/

## 2021-08-07 ENCOUNTER — Other Ambulatory Visit: Payer: Self-pay | Admitting: *Deleted

## 2021-08-07 DIAGNOSIS — K219 Gastro-esophageal reflux disease without esophagitis: Secondary | ICD-10-CM | POA: Diagnosis not present

## 2021-08-07 DIAGNOSIS — I2510 Atherosclerotic heart disease of native coronary artery without angina pectoris: Secondary | ICD-10-CM | POA: Diagnosis not present

## 2021-08-07 DIAGNOSIS — E119 Type 2 diabetes mellitus without complications: Secondary | ICD-10-CM | POA: Diagnosis not present

## 2021-08-07 DIAGNOSIS — Z794 Long term (current) use of insulin: Secondary | ICD-10-CM | POA: Diagnosis not present

## 2021-08-07 DIAGNOSIS — Z993 Dependence on wheelchair: Secondary | ICD-10-CM | POA: Diagnosis not present

## 2021-08-07 DIAGNOSIS — E785 Hyperlipidemia, unspecified: Secondary | ICD-10-CM | POA: Diagnosis not present

## 2021-08-07 DIAGNOSIS — I5032 Chronic diastolic (congestive) heart failure: Secondary | ICD-10-CM | POA: Diagnosis not present

## 2021-08-07 DIAGNOSIS — Z9181 History of falling: Secondary | ICD-10-CM | POA: Diagnosis not present

## 2021-08-07 DIAGNOSIS — R296 Repeated falls: Secondary | ICD-10-CM | POA: Diagnosis not present

## 2021-08-07 DIAGNOSIS — N3000 Acute cystitis without hematuria: Secondary | ICD-10-CM | POA: Diagnosis not present

## 2021-08-07 DIAGNOSIS — I110 Hypertensive heart disease with heart failure: Secondary | ICD-10-CM | POA: Diagnosis not present

## 2021-08-07 DIAGNOSIS — R42 Dizziness and giddiness: Secondary | ICD-10-CM | POA: Diagnosis not present

## 2021-08-07 NOTE — Patient Outreach (Signed)
[place] Coordinator follow up. [facility] eligible member screened for potential [facility] Care Management services as a benefit of member's insurance plan. ? ?Verified in [facility] Ms. Eckstein transitioned from [facility] SNF to home on 08/06/21. ? ?Telephone call made to mobile phone on file for member at [phone] to discuss [facility] follow up. Spoke with member's daughter [name] who states home health is making visit today. States her sister is Ms. Reesor's primary caregiver during the day and that both she and her sister provide the necessary support and management needed. Declines [facility] Care Management follow up.   ? ?Confirmed with [facility] SW Ms. Scharf has [place]. Also noted [facility] palliative is following.  ? ? ? ? ?Marthenia Rolling, MSN, RN,BSN ?[place] Coordinator ?[phone] [facility]) ?[phone]  (Toll free office)   ?

## 2021-08-08 DIAGNOSIS — I5032 Chronic diastolic (congestive) heart failure: Secondary | ICD-10-CM | POA: Diagnosis not present

## 2021-08-08 DIAGNOSIS — I110 Hypertensive heart disease with heart failure: Secondary | ICD-10-CM | POA: Diagnosis not present

## 2021-08-08 DIAGNOSIS — E785 Hyperlipidemia, unspecified: Secondary | ICD-10-CM | POA: Diagnosis not present

## 2021-08-08 DIAGNOSIS — Z9181 History of falling: Secondary | ICD-10-CM | POA: Diagnosis not present

## 2021-08-08 DIAGNOSIS — I2510 Atherosclerotic heart disease of native coronary artery without angina pectoris: Secondary | ICD-10-CM | POA: Diagnosis not present

## 2021-08-08 DIAGNOSIS — Z794 Long term (current) use of insulin: Secondary | ICD-10-CM | POA: Diagnosis not present

## 2021-08-08 DIAGNOSIS — K219 Gastro-esophageal reflux disease without esophagitis: Secondary | ICD-10-CM | POA: Diagnosis not present

## 2021-08-08 DIAGNOSIS — E119 Type 2 diabetes mellitus without complications: Secondary | ICD-10-CM | POA: Diagnosis not present

## 2021-08-08 DIAGNOSIS — R42 Dizziness and giddiness: Secondary | ICD-10-CM | POA: Diagnosis not present

## 2021-08-08 DIAGNOSIS — R296 Repeated falls: Secondary | ICD-10-CM | POA: Diagnosis not present

## 2021-08-08 DIAGNOSIS — N3000 Acute cystitis without hematuria: Secondary | ICD-10-CM | POA: Diagnosis not present

## 2021-08-08 DIAGNOSIS — Z993 Dependence on wheelchair: Secondary | ICD-10-CM | POA: Diagnosis not present

## 2021-08-13 DIAGNOSIS — I110 Hypertensive heart disease with heart failure: Secondary | ICD-10-CM | POA: Diagnosis not present

## 2021-08-13 DIAGNOSIS — I5030 Unspecified diastolic (congestive) heart failure: Secondary | ICD-10-CM | POA: Diagnosis not present

## 2021-08-13 DIAGNOSIS — I5032 Chronic diastolic (congestive) heart failure: Secondary | ICD-10-CM | POA: Diagnosis not present

## 2021-08-13 DIAGNOSIS — R296 Repeated falls: Secondary | ICD-10-CM | POA: Diagnosis not present

## 2021-08-13 DIAGNOSIS — I10 Essential (primary) hypertension: Secondary | ICD-10-CM | POA: Diagnosis not present

## 2021-08-13 DIAGNOSIS — A419 Sepsis, unspecified organism: Secondary | ICD-10-CM | POA: Diagnosis not present

## 2021-08-13 DIAGNOSIS — Z993 Dependence on wheelchair: Secondary | ICD-10-CM | POA: Diagnosis not present

## 2021-08-13 DIAGNOSIS — E785 Hyperlipidemia, unspecified: Secondary | ICD-10-CM | POA: Diagnosis not present

## 2021-08-13 DIAGNOSIS — R42 Dizziness and giddiness: Secondary | ICD-10-CM | POA: Diagnosis not present

## 2021-08-13 DIAGNOSIS — I2510 Atherosclerotic heart disease of native coronary artery without angina pectoris: Secondary | ICD-10-CM | POA: Diagnosis not present

## 2021-08-13 DIAGNOSIS — N3000 Acute cystitis without hematuria: Secondary | ICD-10-CM | POA: Diagnosis not present

## 2021-08-13 DIAGNOSIS — E119 Type 2 diabetes mellitus without complications: Secondary | ICD-10-CM | POA: Diagnosis not present

## 2021-08-13 DIAGNOSIS — K219 Gastro-esophageal reflux disease without esophagitis: Secondary | ICD-10-CM | POA: Diagnosis not present

## 2021-08-13 DIAGNOSIS — Z794 Long term (current) use of insulin: Secondary | ICD-10-CM | POA: Diagnosis not present

## 2021-08-13 DIAGNOSIS — Z9181 History of falling: Secondary | ICD-10-CM | POA: Diagnosis not present

## 2021-08-15 DIAGNOSIS — E785 Hyperlipidemia, unspecified: Secondary | ICD-10-CM | POA: Diagnosis not present

## 2021-08-15 DIAGNOSIS — Z9181 History of falling: Secondary | ICD-10-CM | POA: Diagnosis not present

## 2021-08-15 DIAGNOSIS — N3000 Acute cystitis without hematuria: Secondary | ICD-10-CM | POA: Diagnosis not present

## 2021-08-15 DIAGNOSIS — I10 Essential (primary) hypertension: Secondary | ICD-10-CM | POA: Diagnosis not present

## 2021-08-15 DIAGNOSIS — I2510 Atherosclerotic heart disease of native coronary artery without angina pectoris: Secondary | ICD-10-CM | POA: Diagnosis not present

## 2021-08-15 DIAGNOSIS — I5032 Chronic diastolic (congestive) heart failure: Secondary | ICD-10-CM | POA: Diagnosis not present

## 2021-08-15 DIAGNOSIS — R42 Dizziness and giddiness: Secondary | ICD-10-CM | POA: Diagnosis not present

## 2021-08-15 DIAGNOSIS — Z794 Long term (current) use of insulin: Secondary | ICD-10-CM | POA: Diagnosis not present

## 2021-08-15 DIAGNOSIS — R296 Repeated falls: Secondary | ICD-10-CM | POA: Diagnosis not present

## 2021-08-15 DIAGNOSIS — K219 Gastro-esophageal reflux disease without esophagitis: Secondary | ICD-10-CM | POA: Diagnosis not present

## 2021-08-15 DIAGNOSIS — E119 Type 2 diabetes mellitus without complications: Secondary | ICD-10-CM | POA: Diagnosis not present

## 2021-08-15 DIAGNOSIS — M419 Scoliosis, unspecified: Secondary | ICD-10-CM | POA: Diagnosis not present

## 2021-08-15 DIAGNOSIS — I110 Hypertensive heart disease with heart failure: Secondary | ICD-10-CM | POA: Diagnosis not present

## 2021-08-15 DIAGNOSIS — M5459 Other low back pain: Secondary | ICD-10-CM | POA: Diagnosis not present

## 2021-08-15 DIAGNOSIS — Z993 Dependence on wheelchair: Secondary | ICD-10-CM | POA: Diagnosis not present

## 2021-08-17 DIAGNOSIS — I110 Hypertensive heart disease with heart failure: Secondary | ICD-10-CM | POA: Diagnosis not present

## 2021-08-17 DIAGNOSIS — K219 Gastro-esophageal reflux disease without esophagitis: Secondary | ICD-10-CM | POA: Diagnosis not present

## 2021-08-17 DIAGNOSIS — R42 Dizziness and giddiness: Secondary | ICD-10-CM | POA: Diagnosis not present

## 2021-08-17 DIAGNOSIS — E119 Type 2 diabetes mellitus without complications: Secondary | ICD-10-CM | POA: Diagnosis not present

## 2021-08-17 DIAGNOSIS — R296 Repeated falls: Secondary | ICD-10-CM | POA: Diagnosis not present

## 2021-08-17 DIAGNOSIS — E785 Hyperlipidemia, unspecified: Secondary | ICD-10-CM | POA: Diagnosis not present

## 2021-08-17 DIAGNOSIS — I2510 Atherosclerotic heart disease of native coronary artery without angina pectoris: Secondary | ICD-10-CM | POA: Diagnosis not present

## 2021-08-17 DIAGNOSIS — Z993 Dependence on wheelchair: Secondary | ICD-10-CM | POA: Diagnosis not present

## 2021-08-17 DIAGNOSIS — Z794 Long term (current) use of insulin: Secondary | ICD-10-CM | POA: Diagnosis not present

## 2021-08-17 DIAGNOSIS — I5032 Chronic diastolic (congestive) heart failure: Secondary | ICD-10-CM | POA: Diagnosis not present

## 2021-08-17 DIAGNOSIS — Z9181 History of falling: Secondary | ICD-10-CM | POA: Diagnosis not present

## 2021-08-17 DIAGNOSIS — N3000 Acute cystitis without hematuria: Secondary | ICD-10-CM | POA: Diagnosis not present

## 2021-08-20 ENCOUNTER — Other Ambulatory Visit: Payer: Medicare Other | Admitting: Hospice

## 2021-08-20 DIAGNOSIS — I5032 Chronic diastolic (congestive) heart failure: Secondary | ICD-10-CM | POA: Diagnosis not present

## 2021-08-20 DIAGNOSIS — K219 Gastro-esophageal reflux disease without esophagitis: Secondary | ICD-10-CM | POA: Diagnosis not present

## 2021-08-20 DIAGNOSIS — I110 Hypertensive heart disease with heart failure: Secondary | ICD-10-CM | POA: Diagnosis not present

## 2021-08-20 DIAGNOSIS — R296 Repeated falls: Secondary | ICD-10-CM | POA: Diagnosis not present

## 2021-08-20 DIAGNOSIS — N3000 Acute cystitis without hematuria: Secondary | ICD-10-CM | POA: Diagnosis not present

## 2021-08-20 DIAGNOSIS — Z794 Long term (current) use of insulin: Secondary | ICD-10-CM | POA: Diagnosis not present

## 2021-08-20 DIAGNOSIS — Z9181 History of falling: Secondary | ICD-10-CM | POA: Diagnosis not present

## 2021-08-20 DIAGNOSIS — E119 Type 2 diabetes mellitus without complications: Secondary | ICD-10-CM | POA: Diagnosis not present

## 2021-08-20 DIAGNOSIS — Z993 Dependence on wheelchair: Secondary | ICD-10-CM | POA: Diagnosis not present

## 2021-08-20 DIAGNOSIS — E785 Hyperlipidemia, unspecified: Secondary | ICD-10-CM | POA: Diagnosis not present

## 2021-08-20 DIAGNOSIS — R42 Dizziness and giddiness: Secondary | ICD-10-CM | POA: Diagnosis not present

## 2021-08-20 DIAGNOSIS — I2510 Atherosclerotic heart disease of native coronary artery without angina pectoris: Secondary | ICD-10-CM | POA: Diagnosis not present

## 2021-08-22 DIAGNOSIS — Z794 Long term (current) use of insulin: Secondary | ICD-10-CM | POA: Diagnosis not present

## 2021-08-22 DIAGNOSIS — Z9181 History of falling: Secondary | ICD-10-CM | POA: Diagnosis not present

## 2021-08-22 DIAGNOSIS — E785 Hyperlipidemia, unspecified: Secondary | ICD-10-CM | POA: Diagnosis not present

## 2021-08-22 DIAGNOSIS — Z993 Dependence on wheelchair: Secondary | ICD-10-CM | POA: Diagnosis not present

## 2021-08-22 DIAGNOSIS — R296 Repeated falls: Secondary | ICD-10-CM | POA: Diagnosis not present

## 2021-08-22 DIAGNOSIS — R42 Dizziness and giddiness: Secondary | ICD-10-CM | POA: Diagnosis not present

## 2021-08-22 DIAGNOSIS — K219 Gastro-esophageal reflux disease without esophagitis: Secondary | ICD-10-CM | POA: Diagnosis not present

## 2021-08-22 DIAGNOSIS — I2510 Atherosclerotic heart disease of native coronary artery without angina pectoris: Secondary | ICD-10-CM | POA: Diagnosis not present

## 2021-08-22 DIAGNOSIS — I110 Hypertensive heart disease with heart failure: Secondary | ICD-10-CM | POA: Diagnosis not present

## 2021-08-22 DIAGNOSIS — I5032 Chronic diastolic (congestive) heart failure: Secondary | ICD-10-CM | POA: Diagnosis not present

## 2021-08-22 DIAGNOSIS — N3000 Acute cystitis without hematuria: Secondary | ICD-10-CM | POA: Diagnosis not present

## 2021-08-22 DIAGNOSIS — E119 Type 2 diabetes mellitus without complications: Secondary | ICD-10-CM | POA: Diagnosis not present

## 2021-08-23 DIAGNOSIS — K219 Gastro-esophageal reflux disease without esophagitis: Secondary | ICD-10-CM | POA: Diagnosis not present

## 2021-08-23 DIAGNOSIS — Z9181 History of falling: Secondary | ICD-10-CM | POA: Diagnosis not present

## 2021-08-23 DIAGNOSIS — E119 Type 2 diabetes mellitus without complications: Secondary | ICD-10-CM | POA: Diagnosis not present

## 2021-08-23 DIAGNOSIS — N3000 Acute cystitis without hematuria: Secondary | ICD-10-CM | POA: Diagnosis not present

## 2021-08-23 DIAGNOSIS — I2510 Atherosclerotic heart disease of native coronary artery without angina pectoris: Secondary | ICD-10-CM | POA: Diagnosis not present

## 2021-08-23 DIAGNOSIS — Z993 Dependence on wheelchair: Secondary | ICD-10-CM | POA: Diagnosis not present

## 2021-08-23 DIAGNOSIS — I5032 Chronic diastolic (congestive) heart failure: Secondary | ICD-10-CM | POA: Diagnosis not present

## 2021-08-23 DIAGNOSIS — I110 Hypertensive heart disease with heart failure: Secondary | ICD-10-CM | POA: Diagnosis not present

## 2021-08-23 DIAGNOSIS — Z794 Long term (current) use of insulin: Secondary | ICD-10-CM | POA: Diagnosis not present

## 2021-08-23 DIAGNOSIS — R296 Repeated falls: Secondary | ICD-10-CM | POA: Diagnosis not present

## 2021-08-23 DIAGNOSIS — R42 Dizziness and giddiness: Secondary | ICD-10-CM | POA: Diagnosis not present

## 2021-08-23 DIAGNOSIS — E785 Hyperlipidemia, unspecified: Secondary | ICD-10-CM | POA: Diagnosis not present

## 2021-08-27 ENCOUNTER — Other Ambulatory Visit: Payer: Medicare Other | Admitting: Hospice

## 2021-08-27 DIAGNOSIS — Z9181 History of falling: Secondary | ICD-10-CM | POA: Diagnosis not present

## 2021-08-27 DIAGNOSIS — E119 Type 2 diabetes mellitus without complications: Secondary | ICD-10-CM | POA: Diagnosis not present

## 2021-08-27 DIAGNOSIS — Z993 Dependence on wheelchair: Secondary | ICD-10-CM | POA: Diagnosis not present

## 2021-08-27 DIAGNOSIS — R296 Repeated falls: Secondary | ICD-10-CM | POA: Diagnosis not present

## 2021-08-27 DIAGNOSIS — K219 Gastro-esophageal reflux disease without esophagitis: Secondary | ICD-10-CM | POA: Diagnosis not present

## 2021-08-27 DIAGNOSIS — R42 Dizziness and giddiness: Secondary | ICD-10-CM | POA: Diagnosis not present

## 2021-08-27 DIAGNOSIS — I2510 Atherosclerotic heart disease of native coronary artery without angina pectoris: Secondary | ICD-10-CM | POA: Diagnosis not present

## 2021-08-27 DIAGNOSIS — I5032 Chronic diastolic (congestive) heart failure: Secondary | ICD-10-CM | POA: Diagnosis not present

## 2021-08-27 DIAGNOSIS — Z794 Long term (current) use of insulin: Secondary | ICD-10-CM | POA: Diagnosis not present

## 2021-08-27 DIAGNOSIS — N3000 Acute cystitis without hematuria: Secondary | ICD-10-CM | POA: Diagnosis not present

## 2021-08-27 DIAGNOSIS — E785 Hyperlipidemia, unspecified: Secondary | ICD-10-CM | POA: Diagnosis not present

## 2021-08-27 DIAGNOSIS — I110 Hypertensive heart disease with heart failure: Secondary | ICD-10-CM | POA: Diagnosis not present

## 2021-08-28 DIAGNOSIS — R42 Dizziness and giddiness: Secondary | ICD-10-CM | POA: Diagnosis not present

## 2021-08-28 DIAGNOSIS — K219 Gastro-esophageal reflux disease without esophagitis: Secondary | ICD-10-CM | POA: Diagnosis not present

## 2021-08-28 DIAGNOSIS — E119 Type 2 diabetes mellitus without complications: Secondary | ICD-10-CM | POA: Diagnosis not present

## 2021-08-28 DIAGNOSIS — I5032 Chronic diastolic (congestive) heart failure: Secondary | ICD-10-CM | POA: Diagnosis not present

## 2021-08-28 DIAGNOSIS — R296 Repeated falls: Secondary | ICD-10-CM | POA: Diagnosis not present

## 2021-08-28 DIAGNOSIS — E785 Hyperlipidemia, unspecified: Secondary | ICD-10-CM | POA: Diagnosis not present

## 2021-08-28 DIAGNOSIS — Z794 Long term (current) use of insulin: Secondary | ICD-10-CM | POA: Diagnosis not present

## 2021-08-28 DIAGNOSIS — Z993 Dependence on wheelchair: Secondary | ICD-10-CM | POA: Diagnosis not present

## 2021-08-28 DIAGNOSIS — Z9181 History of falling: Secondary | ICD-10-CM | POA: Diagnosis not present

## 2021-08-28 DIAGNOSIS — I2510 Atherosclerotic heart disease of native coronary artery without angina pectoris: Secondary | ICD-10-CM | POA: Diagnosis not present

## 2021-08-28 DIAGNOSIS — I110 Hypertensive heart disease with heart failure: Secondary | ICD-10-CM | POA: Diagnosis not present

## 2021-08-28 DIAGNOSIS — N3000 Acute cystitis without hematuria: Secondary | ICD-10-CM | POA: Diagnosis not present

## 2021-08-29 DIAGNOSIS — I110 Hypertensive heart disease with heart failure: Secondary | ICD-10-CM | POA: Diagnosis not present

## 2021-08-29 DIAGNOSIS — K219 Gastro-esophageal reflux disease without esophagitis: Secondary | ICD-10-CM | POA: Diagnosis not present

## 2021-08-29 DIAGNOSIS — I2510 Atherosclerotic heart disease of native coronary artery without angina pectoris: Secondary | ICD-10-CM | POA: Diagnosis not present

## 2021-08-29 DIAGNOSIS — Z9181 History of falling: Secondary | ICD-10-CM | POA: Diagnosis not present

## 2021-08-29 DIAGNOSIS — I5032 Chronic diastolic (congestive) heart failure: Secondary | ICD-10-CM | POA: Diagnosis not present

## 2021-08-29 DIAGNOSIS — Z993 Dependence on wheelchair: Secondary | ICD-10-CM | POA: Diagnosis not present

## 2021-08-29 DIAGNOSIS — R42 Dizziness and giddiness: Secondary | ICD-10-CM | POA: Diagnosis not present

## 2021-08-29 DIAGNOSIS — E119 Type 2 diabetes mellitus without complications: Secondary | ICD-10-CM | POA: Diagnosis not present

## 2021-08-29 DIAGNOSIS — N3000 Acute cystitis without hematuria: Secondary | ICD-10-CM | POA: Diagnosis not present

## 2021-08-29 DIAGNOSIS — Z794 Long term (current) use of insulin: Secondary | ICD-10-CM | POA: Diagnosis not present

## 2021-08-29 DIAGNOSIS — E785 Hyperlipidemia, unspecified: Secondary | ICD-10-CM | POA: Diagnosis not present

## 2021-08-29 DIAGNOSIS — R296 Repeated falls: Secondary | ICD-10-CM | POA: Diagnosis not present

## 2021-09-03 DIAGNOSIS — Z9181 History of falling: Secondary | ICD-10-CM | POA: Diagnosis not present

## 2021-09-03 DIAGNOSIS — Z794 Long term (current) use of insulin: Secondary | ICD-10-CM | POA: Diagnosis not present

## 2021-09-03 DIAGNOSIS — I2510 Atherosclerotic heart disease of native coronary artery without angina pectoris: Secondary | ICD-10-CM | POA: Diagnosis not present

## 2021-09-03 DIAGNOSIS — K219 Gastro-esophageal reflux disease without esophagitis: Secondary | ICD-10-CM | POA: Diagnosis not present

## 2021-09-03 DIAGNOSIS — R296 Repeated falls: Secondary | ICD-10-CM | POA: Diagnosis not present

## 2021-09-03 DIAGNOSIS — I110 Hypertensive heart disease with heart failure: Secondary | ICD-10-CM | POA: Diagnosis not present

## 2021-09-03 DIAGNOSIS — Z993 Dependence on wheelchair: Secondary | ICD-10-CM | POA: Diagnosis not present

## 2021-09-03 DIAGNOSIS — R42 Dizziness and giddiness: Secondary | ICD-10-CM | POA: Diagnosis not present

## 2021-09-03 DIAGNOSIS — N3000 Acute cystitis without hematuria: Secondary | ICD-10-CM | POA: Diagnosis not present

## 2021-09-03 DIAGNOSIS — E119 Type 2 diabetes mellitus without complications: Secondary | ICD-10-CM | POA: Diagnosis not present

## 2021-09-03 DIAGNOSIS — I5032 Chronic diastolic (congestive) heart failure: Secondary | ICD-10-CM | POA: Diagnosis not present

## 2021-09-03 DIAGNOSIS — E785 Hyperlipidemia, unspecified: Secondary | ICD-10-CM | POA: Diagnosis not present

## 2021-09-04 DIAGNOSIS — R296 Repeated falls: Secondary | ICD-10-CM | POA: Diagnosis not present

## 2021-09-04 DIAGNOSIS — R42 Dizziness and giddiness: Secondary | ICD-10-CM | POA: Diagnosis not present

## 2021-09-04 DIAGNOSIS — Z9181 History of falling: Secondary | ICD-10-CM | POA: Diagnosis not present

## 2021-09-04 DIAGNOSIS — N3000 Acute cystitis without hematuria: Secondary | ICD-10-CM | POA: Diagnosis not present

## 2021-09-04 DIAGNOSIS — I5032 Chronic diastolic (congestive) heart failure: Secondary | ICD-10-CM | POA: Diagnosis not present

## 2021-09-04 DIAGNOSIS — Z993 Dependence on wheelchair: Secondary | ICD-10-CM | POA: Diagnosis not present

## 2021-09-04 DIAGNOSIS — I2510 Atherosclerotic heart disease of native coronary artery without angina pectoris: Secondary | ICD-10-CM | POA: Diagnosis not present

## 2021-09-04 DIAGNOSIS — I110 Hypertensive heart disease with heart failure: Secondary | ICD-10-CM | POA: Diagnosis not present

## 2021-09-04 DIAGNOSIS — E119 Type 2 diabetes mellitus without complications: Secondary | ICD-10-CM | POA: Diagnosis not present

## 2021-09-04 DIAGNOSIS — Z794 Long term (current) use of insulin: Secondary | ICD-10-CM | POA: Diagnosis not present

## 2021-09-04 DIAGNOSIS — E785 Hyperlipidemia, unspecified: Secondary | ICD-10-CM | POA: Diagnosis not present

## 2021-09-04 DIAGNOSIS — K219 Gastro-esophageal reflux disease without esophagitis: Secondary | ICD-10-CM | POA: Diagnosis not present

## 2021-09-05 DIAGNOSIS — K219 Gastro-esophageal reflux disease without esophagitis: Secondary | ICD-10-CM | POA: Diagnosis not present

## 2021-09-05 DIAGNOSIS — E119 Type 2 diabetes mellitus without complications: Secondary | ICD-10-CM | POA: Diagnosis not present

## 2021-09-05 DIAGNOSIS — Z794 Long term (current) use of insulin: Secondary | ICD-10-CM | POA: Diagnosis not present

## 2021-09-05 DIAGNOSIS — I2510 Atherosclerotic heart disease of native coronary artery without angina pectoris: Secondary | ICD-10-CM | POA: Diagnosis not present

## 2021-09-05 DIAGNOSIS — R42 Dizziness and giddiness: Secondary | ICD-10-CM | POA: Diagnosis not present

## 2021-09-05 DIAGNOSIS — R296 Repeated falls: Secondary | ICD-10-CM | POA: Diagnosis not present

## 2021-09-05 DIAGNOSIS — Z9181 History of falling: Secondary | ICD-10-CM | POA: Diagnosis not present

## 2021-09-05 DIAGNOSIS — Z993 Dependence on wheelchair: Secondary | ICD-10-CM | POA: Diagnosis not present

## 2021-09-05 DIAGNOSIS — I110 Hypertensive heart disease with heart failure: Secondary | ICD-10-CM | POA: Diagnosis not present

## 2021-09-05 DIAGNOSIS — I5032 Chronic diastolic (congestive) heart failure: Secondary | ICD-10-CM | POA: Diagnosis not present

## 2021-09-05 DIAGNOSIS — E785 Hyperlipidemia, unspecified: Secondary | ICD-10-CM | POA: Diagnosis not present

## 2021-09-05 DIAGNOSIS — N3000 Acute cystitis without hematuria: Secondary | ICD-10-CM | POA: Diagnosis not present

## 2021-09-10 DIAGNOSIS — E785 Hyperlipidemia, unspecified: Secondary | ICD-10-CM | POA: Diagnosis not present

## 2021-09-10 DIAGNOSIS — R42 Dizziness and giddiness: Secondary | ICD-10-CM | POA: Diagnosis not present

## 2021-09-10 DIAGNOSIS — N3000 Acute cystitis without hematuria: Secondary | ICD-10-CM | POA: Diagnosis not present

## 2021-09-10 DIAGNOSIS — Z9181 History of falling: Secondary | ICD-10-CM | POA: Diagnosis not present

## 2021-09-10 DIAGNOSIS — Z794 Long term (current) use of insulin: Secondary | ICD-10-CM | POA: Diagnosis not present

## 2021-09-10 DIAGNOSIS — I2510 Atherosclerotic heart disease of native coronary artery without angina pectoris: Secondary | ICD-10-CM | POA: Diagnosis not present

## 2021-09-10 DIAGNOSIS — K219 Gastro-esophageal reflux disease without esophagitis: Secondary | ICD-10-CM | POA: Diagnosis not present

## 2021-09-10 DIAGNOSIS — R296 Repeated falls: Secondary | ICD-10-CM | POA: Diagnosis not present

## 2021-09-10 DIAGNOSIS — I5032 Chronic diastolic (congestive) heart failure: Secondary | ICD-10-CM | POA: Diagnosis not present

## 2021-09-10 DIAGNOSIS — E119 Type 2 diabetes mellitus without complications: Secondary | ICD-10-CM | POA: Diagnosis not present

## 2021-09-10 DIAGNOSIS — Z993 Dependence on wheelchair: Secondary | ICD-10-CM | POA: Diagnosis not present

## 2021-09-10 DIAGNOSIS — I110 Hypertensive heart disease with heart failure: Secondary | ICD-10-CM | POA: Diagnosis not present

## 2021-09-12 DIAGNOSIS — E119 Type 2 diabetes mellitus without complications: Secondary | ICD-10-CM | POA: Diagnosis not present

## 2021-09-12 DIAGNOSIS — Z9181 History of falling: Secondary | ICD-10-CM | POA: Diagnosis not present

## 2021-09-12 DIAGNOSIS — K219 Gastro-esophageal reflux disease without esophagitis: Secondary | ICD-10-CM | POA: Diagnosis not present

## 2021-09-12 DIAGNOSIS — I110 Hypertensive heart disease with heart failure: Secondary | ICD-10-CM | POA: Diagnosis not present

## 2021-09-12 DIAGNOSIS — E785 Hyperlipidemia, unspecified: Secondary | ICD-10-CM | POA: Diagnosis not present

## 2021-09-12 DIAGNOSIS — R296 Repeated falls: Secondary | ICD-10-CM | POA: Diagnosis not present

## 2021-09-12 DIAGNOSIS — Z993 Dependence on wheelchair: Secondary | ICD-10-CM | POA: Diagnosis not present

## 2021-09-12 DIAGNOSIS — N3000 Acute cystitis without hematuria: Secondary | ICD-10-CM | POA: Diagnosis not present

## 2021-09-12 DIAGNOSIS — I2510 Atherosclerotic heart disease of native coronary artery without angina pectoris: Secondary | ICD-10-CM | POA: Diagnosis not present

## 2021-09-12 DIAGNOSIS — I5032 Chronic diastolic (congestive) heart failure: Secondary | ICD-10-CM | POA: Diagnosis not present

## 2021-09-12 DIAGNOSIS — Z794 Long term (current) use of insulin: Secondary | ICD-10-CM | POA: Diagnosis not present

## 2021-09-12 DIAGNOSIS — R42 Dizziness and giddiness: Secondary | ICD-10-CM | POA: Diagnosis not present

## 2021-09-18 DIAGNOSIS — Z993 Dependence on wheelchair: Secondary | ICD-10-CM | POA: Diagnosis not present

## 2021-09-18 DIAGNOSIS — E785 Hyperlipidemia, unspecified: Secondary | ICD-10-CM | POA: Diagnosis not present

## 2021-09-18 DIAGNOSIS — I2510 Atherosclerotic heart disease of native coronary artery without angina pectoris: Secondary | ICD-10-CM | POA: Diagnosis not present

## 2021-09-18 DIAGNOSIS — Z794 Long term (current) use of insulin: Secondary | ICD-10-CM | POA: Diagnosis not present

## 2021-09-18 DIAGNOSIS — R296 Repeated falls: Secondary | ICD-10-CM | POA: Diagnosis not present

## 2021-09-18 DIAGNOSIS — N3000 Acute cystitis without hematuria: Secondary | ICD-10-CM | POA: Diagnosis not present

## 2021-09-18 DIAGNOSIS — I5032 Chronic diastolic (congestive) heart failure: Secondary | ICD-10-CM | POA: Diagnosis not present

## 2021-09-18 DIAGNOSIS — E119 Type 2 diabetes mellitus without complications: Secondary | ICD-10-CM | POA: Diagnosis not present

## 2021-09-18 DIAGNOSIS — K219 Gastro-esophageal reflux disease without esophagitis: Secondary | ICD-10-CM | POA: Diagnosis not present

## 2021-09-18 DIAGNOSIS — I110 Hypertensive heart disease with heart failure: Secondary | ICD-10-CM | POA: Diagnosis not present

## 2021-09-18 DIAGNOSIS — R42 Dizziness and giddiness: Secondary | ICD-10-CM | POA: Diagnosis not present

## 2021-09-18 DIAGNOSIS — Z9181 History of falling: Secondary | ICD-10-CM | POA: Diagnosis not present

## 2021-09-20 DIAGNOSIS — M419 Scoliosis, unspecified: Secondary | ICD-10-CM | POA: Diagnosis not present

## 2021-09-20 DIAGNOSIS — I10 Essential (primary) hypertension: Secondary | ICD-10-CM | POA: Diagnosis not present

## 2021-09-20 DIAGNOSIS — M5459 Other low back pain: Secondary | ICD-10-CM | POA: Diagnosis not present

## 2021-09-24 DIAGNOSIS — K219 Gastro-esophageal reflux disease without esophagitis: Secondary | ICD-10-CM | POA: Diagnosis not present

## 2021-09-24 DIAGNOSIS — I110 Hypertensive heart disease with heart failure: Secondary | ICD-10-CM | POA: Diagnosis not present

## 2021-09-24 DIAGNOSIS — Z9181 History of falling: Secondary | ICD-10-CM | POA: Diagnosis not present

## 2021-09-24 DIAGNOSIS — I2510 Atherosclerotic heart disease of native coronary artery without angina pectoris: Secondary | ICD-10-CM | POA: Diagnosis not present

## 2021-09-24 DIAGNOSIS — E119 Type 2 diabetes mellitus without complications: Secondary | ICD-10-CM | POA: Diagnosis not present

## 2021-09-24 DIAGNOSIS — E785 Hyperlipidemia, unspecified: Secondary | ICD-10-CM | POA: Diagnosis not present

## 2021-09-24 DIAGNOSIS — R42 Dizziness and giddiness: Secondary | ICD-10-CM | POA: Diagnosis not present

## 2021-09-24 DIAGNOSIS — N3000 Acute cystitis without hematuria: Secondary | ICD-10-CM | POA: Diagnosis not present

## 2021-09-24 DIAGNOSIS — Z993 Dependence on wheelchair: Secondary | ICD-10-CM | POA: Diagnosis not present

## 2021-09-24 DIAGNOSIS — I5032 Chronic diastolic (congestive) heart failure: Secondary | ICD-10-CM | POA: Diagnosis not present

## 2021-09-24 DIAGNOSIS — R296 Repeated falls: Secondary | ICD-10-CM | POA: Diagnosis not present

## 2021-09-24 DIAGNOSIS — Z794 Long term (current) use of insulin: Secondary | ICD-10-CM | POA: Diagnosis not present

## 2021-09-26 DIAGNOSIS — Z794 Long term (current) use of insulin: Secondary | ICD-10-CM | POA: Diagnosis not present

## 2021-09-26 DIAGNOSIS — R42 Dizziness and giddiness: Secondary | ICD-10-CM | POA: Diagnosis not present

## 2021-09-26 DIAGNOSIS — I5032 Chronic diastolic (congestive) heart failure: Secondary | ICD-10-CM | POA: Diagnosis not present

## 2021-09-26 DIAGNOSIS — K219 Gastro-esophageal reflux disease without esophagitis: Secondary | ICD-10-CM | POA: Diagnosis not present

## 2021-09-26 DIAGNOSIS — E119 Type 2 diabetes mellitus without complications: Secondary | ICD-10-CM | POA: Diagnosis not present

## 2021-09-26 DIAGNOSIS — N3000 Acute cystitis without hematuria: Secondary | ICD-10-CM | POA: Diagnosis not present

## 2021-09-26 DIAGNOSIS — I110 Hypertensive heart disease with heart failure: Secondary | ICD-10-CM | POA: Diagnosis not present

## 2021-09-26 DIAGNOSIS — I2510 Atherosclerotic heart disease of native coronary artery without angina pectoris: Secondary | ICD-10-CM | POA: Diagnosis not present

## 2021-09-26 DIAGNOSIS — E785 Hyperlipidemia, unspecified: Secondary | ICD-10-CM | POA: Diagnosis not present

## 2021-09-26 DIAGNOSIS — R296 Repeated falls: Secondary | ICD-10-CM | POA: Diagnosis not present

## 2021-09-26 DIAGNOSIS — Z9181 History of falling: Secondary | ICD-10-CM | POA: Diagnosis not present

## 2021-09-26 DIAGNOSIS — Z993 Dependence on wheelchair: Secondary | ICD-10-CM | POA: Diagnosis not present

## 2021-09-28 DIAGNOSIS — Z79899 Other long term (current) drug therapy: Secondary | ICD-10-CM | POA: Diagnosis not present

## 2021-10-01 DIAGNOSIS — K219 Gastro-esophageal reflux disease without esophagitis: Secondary | ICD-10-CM | POA: Diagnosis not present

## 2021-10-01 DIAGNOSIS — N3000 Acute cystitis without hematuria: Secondary | ICD-10-CM | POA: Diagnosis not present

## 2021-10-01 DIAGNOSIS — I110 Hypertensive heart disease with heart failure: Secondary | ICD-10-CM | POA: Diagnosis not present

## 2021-10-01 DIAGNOSIS — I2510 Atherosclerotic heart disease of native coronary artery without angina pectoris: Secondary | ICD-10-CM | POA: Diagnosis not present

## 2021-10-01 DIAGNOSIS — E119 Type 2 diabetes mellitus without complications: Secondary | ICD-10-CM | POA: Diagnosis not present

## 2021-10-01 DIAGNOSIS — E785 Hyperlipidemia, unspecified: Secondary | ICD-10-CM | POA: Diagnosis not present

## 2021-10-01 DIAGNOSIS — Z993 Dependence on wheelchair: Secondary | ICD-10-CM | POA: Diagnosis not present

## 2021-10-01 DIAGNOSIS — R42 Dizziness and giddiness: Secondary | ICD-10-CM | POA: Diagnosis not present

## 2021-10-01 DIAGNOSIS — Z794 Long term (current) use of insulin: Secondary | ICD-10-CM | POA: Diagnosis not present

## 2021-10-01 DIAGNOSIS — I5032 Chronic diastolic (congestive) heart failure: Secondary | ICD-10-CM | POA: Diagnosis not present

## 2021-10-01 DIAGNOSIS — Z9181 History of falling: Secondary | ICD-10-CM | POA: Diagnosis not present

## 2021-10-01 DIAGNOSIS — R296 Repeated falls: Secondary | ICD-10-CM | POA: Diagnosis not present

## 2021-10-03 DIAGNOSIS — E119 Type 2 diabetes mellitus without complications: Secondary | ICD-10-CM | POA: Diagnosis not present

## 2021-10-03 DIAGNOSIS — K219 Gastro-esophageal reflux disease without esophagitis: Secondary | ICD-10-CM | POA: Diagnosis not present

## 2021-10-03 DIAGNOSIS — R42 Dizziness and giddiness: Secondary | ICD-10-CM | POA: Diagnosis not present

## 2021-10-03 DIAGNOSIS — I2510 Atherosclerotic heart disease of native coronary artery without angina pectoris: Secondary | ICD-10-CM | POA: Diagnosis not present

## 2021-10-03 DIAGNOSIS — I5032 Chronic diastolic (congestive) heart failure: Secondary | ICD-10-CM | POA: Diagnosis not present

## 2021-10-03 DIAGNOSIS — I110 Hypertensive heart disease with heart failure: Secondary | ICD-10-CM | POA: Diagnosis not present

## 2021-10-03 DIAGNOSIS — Z794 Long term (current) use of insulin: Secondary | ICD-10-CM | POA: Diagnosis not present

## 2021-10-03 DIAGNOSIS — R296 Repeated falls: Secondary | ICD-10-CM | POA: Diagnosis not present

## 2021-10-03 DIAGNOSIS — E785 Hyperlipidemia, unspecified: Secondary | ICD-10-CM | POA: Diagnosis not present

## 2021-10-03 DIAGNOSIS — Z993 Dependence on wheelchair: Secondary | ICD-10-CM | POA: Diagnosis not present

## 2021-10-03 DIAGNOSIS — N3000 Acute cystitis without hematuria: Secondary | ICD-10-CM | POA: Diagnosis not present

## 2021-10-03 DIAGNOSIS — Z9181 History of falling: Secondary | ICD-10-CM | POA: Diagnosis not present

## 2021-10-06 DIAGNOSIS — Z87440 Personal history of urinary (tract) infections: Secondary | ICD-10-CM | POA: Diagnosis not present

## 2021-10-06 DIAGNOSIS — I110 Hypertensive heart disease with heart failure: Secondary | ICD-10-CM | POA: Diagnosis not present

## 2021-10-06 DIAGNOSIS — R296 Repeated falls: Secondary | ICD-10-CM | POA: Diagnosis not present

## 2021-10-06 DIAGNOSIS — I2510 Atherosclerotic heart disease of native coronary artery without angina pectoris: Secondary | ICD-10-CM | POA: Diagnosis not present

## 2021-10-06 DIAGNOSIS — R42 Dizziness and giddiness: Secondary | ICD-10-CM | POA: Diagnosis not present

## 2021-10-06 DIAGNOSIS — E785 Hyperlipidemia, unspecified: Secondary | ICD-10-CM | POA: Diagnosis not present

## 2021-10-06 DIAGNOSIS — K219 Gastro-esophageal reflux disease without esophagitis: Secondary | ICD-10-CM | POA: Diagnosis not present

## 2021-10-06 DIAGNOSIS — E119 Type 2 diabetes mellitus without complications: Secondary | ICD-10-CM | POA: Diagnosis not present

## 2021-10-06 DIAGNOSIS — Z9181 History of falling: Secondary | ICD-10-CM | POA: Diagnosis not present

## 2021-10-06 DIAGNOSIS — I5032 Chronic diastolic (congestive) heart failure: Secondary | ICD-10-CM | POA: Diagnosis not present

## 2021-10-12 DIAGNOSIS — I5032 Chronic diastolic (congestive) heart failure: Secondary | ICD-10-CM | POA: Diagnosis not present

## 2021-10-12 DIAGNOSIS — K219 Gastro-esophageal reflux disease without esophagitis: Secondary | ICD-10-CM | POA: Diagnosis not present

## 2021-10-12 DIAGNOSIS — I2510 Atherosclerotic heart disease of native coronary artery without angina pectoris: Secondary | ICD-10-CM | POA: Diagnosis not present

## 2021-10-12 DIAGNOSIS — I110 Hypertensive heart disease with heart failure: Secondary | ICD-10-CM | POA: Diagnosis not present

## 2021-10-12 DIAGNOSIS — Z794 Long term (current) use of insulin: Secondary | ICD-10-CM | POA: Diagnosis not present

## 2021-10-12 DIAGNOSIS — R296 Repeated falls: Secondary | ICD-10-CM | POA: Diagnosis not present

## 2021-10-12 DIAGNOSIS — Z87440 Personal history of urinary (tract) infections: Secondary | ICD-10-CM | POA: Diagnosis not present

## 2021-10-12 DIAGNOSIS — Z9181 History of falling: Secondary | ICD-10-CM | POA: Diagnosis not present

## 2021-10-12 DIAGNOSIS — R42 Dizziness and giddiness: Secondary | ICD-10-CM | POA: Diagnosis not present

## 2021-10-12 DIAGNOSIS — Z993 Dependence on wheelchair: Secondary | ICD-10-CM | POA: Diagnosis not present

## 2021-10-12 DIAGNOSIS — E119 Type 2 diabetes mellitus without complications: Secondary | ICD-10-CM | POA: Diagnosis not present

## 2021-10-12 DIAGNOSIS — E785 Hyperlipidemia, unspecified: Secondary | ICD-10-CM | POA: Diagnosis not present

## 2021-11-01 DIAGNOSIS — R42 Dizziness and giddiness: Secondary | ICD-10-CM | POA: Diagnosis not present

## 2021-11-01 DIAGNOSIS — I110 Hypertensive heart disease with heart failure: Secondary | ICD-10-CM | POA: Diagnosis not present

## 2021-11-01 DIAGNOSIS — I5032 Chronic diastolic (congestive) heart failure: Secondary | ICD-10-CM | POA: Diagnosis not present

## 2021-11-01 DIAGNOSIS — I2510 Atherosclerotic heart disease of native coronary artery without angina pectoris: Secondary | ICD-10-CM | POA: Diagnosis not present

## 2021-11-01 DIAGNOSIS — Z993 Dependence on wheelchair: Secondary | ICD-10-CM | POA: Diagnosis not present

## 2021-11-01 DIAGNOSIS — E119 Type 2 diabetes mellitus without complications: Secondary | ICD-10-CM | POA: Diagnosis not present

## 2021-11-01 DIAGNOSIS — Z9181 History of falling: Secondary | ICD-10-CM | POA: Diagnosis not present

## 2021-11-01 DIAGNOSIS — K219 Gastro-esophageal reflux disease without esophagitis: Secondary | ICD-10-CM | POA: Diagnosis not present

## 2021-11-01 DIAGNOSIS — E785 Hyperlipidemia, unspecified: Secondary | ICD-10-CM | POA: Diagnosis not present

## 2021-11-01 DIAGNOSIS — Z87440 Personal history of urinary (tract) infections: Secondary | ICD-10-CM | POA: Diagnosis not present

## 2021-11-01 DIAGNOSIS — R296 Repeated falls: Secondary | ICD-10-CM | POA: Diagnosis not present

## 2021-11-01 DIAGNOSIS — Z794 Long term (current) use of insulin: Secondary | ICD-10-CM | POA: Diagnosis not present

## 2021-11-05 DIAGNOSIS — Z79899 Other long term (current) drug therapy: Secondary | ICD-10-CM | POA: Diagnosis not present

## 2021-11-09 DIAGNOSIS — R296 Repeated falls: Secondary | ICD-10-CM | POA: Diagnosis not present

## 2021-11-09 DIAGNOSIS — Z9181 History of falling: Secondary | ICD-10-CM | POA: Diagnosis not present

## 2021-11-09 DIAGNOSIS — I5032 Chronic diastolic (congestive) heart failure: Secondary | ICD-10-CM | POA: Diagnosis not present

## 2021-11-09 DIAGNOSIS — E119 Type 2 diabetes mellitus without complications: Secondary | ICD-10-CM | POA: Diagnosis not present

## 2021-11-09 DIAGNOSIS — Z794 Long term (current) use of insulin: Secondary | ICD-10-CM | POA: Diagnosis not present

## 2021-11-09 DIAGNOSIS — Z87440 Personal history of urinary (tract) infections: Secondary | ICD-10-CM | POA: Diagnosis not present

## 2021-11-09 DIAGNOSIS — E785 Hyperlipidemia, unspecified: Secondary | ICD-10-CM | POA: Diagnosis not present

## 2021-11-09 DIAGNOSIS — Z993 Dependence on wheelchair: Secondary | ICD-10-CM | POA: Diagnosis not present

## 2021-11-09 DIAGNOSIS — I110 Hypertensive heart disease with heart failure: Secondary | ICD-10-CM | POA: Diagnosis not present

## 2021-11-09 DIAGNOSIS — I2510 Atherosclerotic heart disease of native coronary artery without angina pectoris: Secondary | ICD-10-CM | POA: Diagnosis not present

## 2021-11-09 DIAGNOSIS — R42 Dizziness and giddiness: Secondary | ICD-10-CM | POA: Diagnosis not present

## 2021-11-09 DIAGNOSIS — K219 Gastro-esophageal reflux disease without esophagitis: Secondary | ICD-10-CM | POA: Diagnosis not present

## 2021-11-12 DIAGNOSIS — I5032 Chronic diastolic (congestive) heart failure: Secondary | ICD-10-CM | POA: Diagnosis not present

## 2021-11-12 DIAGNOSIS — I2510 Atherosclerotic heart disease of native coronary artery without angina pectoris: Secondary | ICD-10-CM | POA: Diagnosis not present

## 2021-11-12 DIAGNOSIS — K219 Gastro-esophageal reflux disease without esophagitis: Secondary | ICD-10-CM | POA: Diagnosis not present

## 2021-11-12 DIAGNOSIS — Z794 Long term (current) use of insulin: Secondary | ICD-10-CM | POA: Diagnosis not present

## 2021-11-12 DIAGNOSIS — Z9181 History of falling: Secondary | ICD-10-CM | POA: Diagnosis not present

## 2021-11-12 DIAGNOSIS — E119 Type 2 diabetes mellitus without complications: Secondary | ICD-10-CM | POA: Diagnosis not present

## 2021-11-12 DIAGNOSIS — R42 Dizziness and giddiness: Secondary | ICD-10-CM | POA: Diagnosis not present

## 2021-11-12 DIAGNOSIS — E785 Hyperlipidemia, unspecified: Secondary | ICD-10-CM | POA: Diagnosis not present

## 2021-11-12 DIAGNOSIS — Z993 Dependence on wheelchair: Secondary | ICD-10-CM | POA: Diagnosis not present

## 2021-11-12 DIAGNOSIS — R296 Repeated falls: Secondary | ICD-10-CM | POA: Diagnosis not present

## 2021-11-12 DIAGNOSIS — Z87440 Personal history of urinary (tract) infections: Secondary | ICD-10-CM | POA: Diagnosis not present

## 2021-11-12 DIAGNOSIS — I110 Hypertensive heart disease with heart failure: Secondary | ICD-10-CM | POA: Diagnosis not present

## 2021-11-14 DIAGNOSIS — M419 Scoliosis, unspecified: Secondary | ICD-10-CM | POA: Diagnosis not present

## 2021-11-14 DIAGNOSIS — M5459 Other low back pain: Secondary | ICD-10-CM | POA: Diagnosis not present

## 2021-11-14 DIAGNOSIS — I10 Essential (primary) hypertension: Secondary | ICD-10-CM | POA: Diagnosis not present

## 2021-11-19 DIAGNOSIS — R42 Dizziness and giddiness: Secondary | ICD-10-CM | POA: Diagnosis not present

## 2021-11-19 DIAGNOSIS — Z87440 Personal history of urinary (tract) infections: Secondary | ICD-10-CM | POA: Diagnosis not present

## 2021-11-19 DIAGNOSIS — Z993 Dependence on wheelchair: Secondary | ICD-10-CM | POA: Diagnosis not present

## 2021-11-19 DIAGNOSIS — K219 Gastro-esophageal reflux disease without esophagitis: Secondary | ICD-10-CM | POA: Diagnosis not present

## 2021-11-19 DIAGNOSIS — R296 Repeated falls: Secondary | ICD-10-CM | POA: Diagnosis not present

## 2021-11-19 DIAGNOSIS — E119 Type 2 diabetes mellitus without complications: Secondary | ICD-10-CM | POA: Diagnosis not present

## 2021-11-19 DIAGNOSIS — Z9181 History of falling: Secondary | ICD-10-CM | POA: Diagnosis not present

## 2021-11-19 DIAGNOSIS — I110 Hypertensive heart disease with heart failure: Secondary | ICD-10-CM | POA: Diagnosis not present

## 2021-11-19 DIAGNOSIS — I2510 Atherosclerotic heart disease of native coronary artery without angina pectoris: Secondary | ICD-10-CM | POA: Diagnosis not present

## 2021-11-19 DIAGNOSIS — I5032 Chronic diastolic (congestive) heart failure: Secondary | ICD-10-CM | POA: Diagnosis not present

## 2021-11-19 DIAGNOSIS — E785 Hyperlipidemia, unspecified: Secondary | ICD-10-CM | POA: Diagnosis not present

## 2021-11-19 DIAGNOSIS — Z794 Long term (current) use of insulin: Secondary | ICD-10-CM | POA: Diagnosis not present

## 2021-11-22 DIAGNOSIS — N390 Urinary tract infection, site not specified: Secondary | ICD-10-CM | POA: Diagnosis not present

## 2021-11-29 ENCOUNTER — Other Ambulatory Visit: Payer: Medicare Other | Admitting: Hospice

## 2021-11-30 DIAGNOSIS — Z993 Dependence on wheelchair: Secondary | ICD-10-CM | POA: Diagnosis not present

## 2021-11-30 DIAGNOSIS — Z87440 Personal history of urinary (tract) infections: Secondary | ICD-10-CM | POA: Diagnosis not present

## 2021-11-30 DIAGNOSIS — K219 Gastro-esophageal reflux disease without esophagitis: Secondary | ICD-10-CM | POA: Diagnosis not present

## 2021-11-30 DIAGNOSIS — E119 Type 2 diabetes mellitus without complications: Secondary | ICD-10-CM | POA: Diagnosis not present

## 2021-11-30 DIAGNOSIS — I110 Hypertensive heart disease with heart failure: Secondary | ICD-10-CM | POA: Diagnosis not present

## 2021-11-30 DIAGNOSIS — I5032 Chronic diastolic (congestive) heart failure: Secondary | ICD-10-CM | POA: Diagnosis not present

## 2021-11-30 DIAGNOSIS — R42 Dizziness and giddiness: Secondary | ICD-10-CM | POA: Diagnosis not present

## 2021-11-30 DIAGNOSIS — Z9181 History of falling: Secondary | ICD-10-CM | POA: Diagnosis not present

## 2021-11-30 DIAGNOSIS — I2510 Atherosclerotic heart disease of native coronary artery without angina pectoris: Secondary | ICD-10-CM | POA: Diagnosis not present

## 2021-11-30 DIAGNOSIS — E785 Hyperlipidemia, unspecified: Secondary | ICD-10-CM | POA: Diagnosis not present

## 2021-11-30 DIAGNOSIS — Z794 Long term (current) use of insulin: Secondary | ICD-10-CM | POA: Diagnosis not present

## 2021-11-30 DIAGNOSIS — R296 Repeated falls: Secondary | ICD-10-CM | POA: Diagnosis not present

## 2021-11-30 DIAGNOSIS — Z79899 Other long term (current) drug therapy: Secondary | ICD-10-CM | POA: Diagnosis not present

## 2021-12-05 DIAGNOSIS — I110 Hypertensive heart disease with heart failure: Secondary | ICD-10-CM | POA: Diagnosis not present

## 2021-12-05 DIAGNOSIS — Z9181 History of falling: Secondary | ICD-10-CM | POA: Diagnosis not present

## 2021-12-05 DIAGNOSIS — I5032 Chronic diastolic (congestive) heart failure: Secondary | ICD-10-CM | POA: Diagnosis not present

## 2021-12-05 DIAGNOSIS — E119 Type 2 diabetes mellitus without complications: Secondary | ICD-10-CM | POA: Diagnosis not present

## 2021-12-05 DIAGNOSIS — Z87440 Personal history of urinary (tract) infections: Secondary | ICD-10-CM | POA: Diagnosis not present

## 2021-12-05 DIAGNOSIS — K219 Gastro-esophageal reflux disease without esophagitis: Secondary | ICD-10-CM | POA: Diagnosis not present

## 2021-12-05 DIAGNOSIS — Z794 Long term (current) use of insulin: Secondary | ICD-10-CM | POA: Diagnosis not present

## 2021-12-05 DIAGNOSIS — I2510 Atherosclerotic heart disease of native coronary artery without angina pectoris: Secondary | ICD-10-CM | POA: Diagnosis not present

## 2021-12-05 DIAGNOSIS — R42 Dizziness and giddiness: Secondary | ICD-10-CM | POA: Diagnosis not present

## 2021-12-05 DIAGNOSIS — R296 Repeated falls: Secondary | ICD-10-CM | POA: Diagnosis not present

## 2021-12-05 DIAGNOSIS — E785 Hyperlipidemia, unspecified: Secondary | ICD-10-CM | POA: Diagnosis not present

## 2021-12-15 DIAGNOSIS — I5032 Chronic diastolic (congestive) heart failure: Secondary | ICD-10-CM | POA: Diagnosis not present

## 2021-12-15 DIAGNOSIS — Z9181 History of falling: Secondary | ICD-10-CM | POA: Diagnosis not present

## 2021-12-15 DIAGNOSIS — E119 Type 2 diabetes mellitus without complications: Secondary | ICD-10-CM | POA: Diagnosis not present

## 2021-12-15 DIAGNOSIS — I2510 Atherosclerotic heart disease of native coronary artery without angina pectoris: Secondary | ICD-10-CM | POA: Diagnosis not present

## 2021-12-15 DIAGNOSIS — R296 Repeated falls: Secondary | ICD-10-CM | POA: Diagnosis not present

## 2021-12-15 DIAGNOSIS — E785 Hyperlipidemia, unspecified: Secondary | ICD-10-CM | POA: Diagnosis not present

## 2021-12-15 DIAGNOSIS — K219 Gastro-esophageal reflux disease without esophagitis: Secondary | ICD-10-CM | POA: Diagnosis not present

## 2021-12-15 DIAGNOSIS — Z87440 Personal history of urinary (tract) infections: Secondary | ICD-10-CM | POA: Diagnosis not present

## 2021-12-15 DIAGNOSIS — Z794 Long term (current) use of insulin: Secondary | ICD-10-CM | POA: Diagnosis not present

## 2021-12-15 DIAGNOSIS — I110 Hypertensive heart disease with heart failure: Secondary | ICD-10-CM | POA: Diagnosis not present

## 2021-12-15 DIAGNOSIS — R42 Dizziness and giddiness: Secondary | ICD-10-CM | POA: Diagnosis not present

## 2021-12-18 DIAGNOSIS — I110 Hypertensive heart disease with heart failure: Secondary | ICD-10-CM | POA: Diagnosis not present

## 2021-12-18 DIAGNOSIS — I2510 Atherosclerotic heart disease of native coronary artery without angina pectoris: Secondary | ICD-10-CM | POA: Diagnosis not present

## 2021-12-18 DIAGNOSIS — K219 Gastro-esophageal reflux disease without esophagitis: Secondary | ICD-10-CM | POA: Diagnosis not present

## 2021-12-18 DIAGNOSIS — Z87440 Personal history of urinary (tract) infections: Secondary | ICD-10-CM | POA: Diagnosis not present

## 2021-12-18 DIAGNOSIS — R42 Dizziness and giddiness: Secondary | ICD-10-CM | POA: Diagnosis not present

## 2021-12-18 DIAGNOSIS — E119 Type 2 diabetes mellitus without complications: Secondary | ICD-10-CM | POA: Diagnosis not present

## 2021-12-18 DIAGNOSIS — Z9181 History of falling: Secondary | ICD-10-CM | POA: Diagnosis not present

## 2021-12-18 DIAGNOSIS — Z794 Long term (current) use of insulin: Secondary | ICD-10-CM | POA: Diagnosis not present

## 2021-12-18 DIAGNOSIS — R296 Repeated falls: Secondary | ICD-10-CM | POA: Diagnosis not present

## 2021-12-18 DIAGNOSIS — I5032 Chronic diastolic (congestive) heart failure: Secondary | ICD-10-CM | POA: Diagnosis not present

## 2021-12-18 DIAGNOSIS — E785 Hyperlipidemia, unspecified: Secondary | ICD-10-CM | POA: Diagnosis not present

## 2022-01-01 DIAGNOSIS — Z79899 Other long term (current) drug therapy: Secondary | ICD-10-CM | POA: Diagnosis not present

## 2022-01-02 DIAGNOSIS — I110 Hypertensive heart disease with heart failure: Secondary | ICD-10-CM | POA: Diagnosis not present

## 2022-01-02 DIAGNOSIS — Z87440 Personal history of urinary (tract) infections: Secondary | ICD-10-CM | POA: Diagnosis not present

## 2022-01-02 DIAGNOSIS — I5032 Chronic diastolic (congestive) heart failure: Secondary | ICD-10-CM | POA: Diagnosis not present

## 2022-01-02 DIAGNOSIS — K219 Gastro-esophageal reflux disease without esophagitis: Secondary | ICD-10-CM | POA: Diagnosis not present

## 2022-01-02 DIAGNOSIS — Z794 Long term (current) use of insulin: Secondary | ICD-10-CM | POA: Diagnosis not present

## 2022-01-02 DIAGNOSIS — R296 Repeated falls: Secondary | ICD-10-CM | POA: Diagnosis not present

## 2022-01-02 DIAGNOSIS — Z9181 History of falling: Secondary | ICD-10-CM | POA: Diagnosis not present

## 2022-01-02 DIAGNOSIS — E119 Type 2 diabetes mellitus without complications: Secondary | ICD-10-CM | POA: Diagnosis not present

## 2022-01-02 DIAGNOSIS — R42 Dizziness and giddiness: Secondary | ICD-10-CM | POA: Diagnosis not present

## 2022-01-02 DIAGNOSIS — I2510 Atherosclerotic heart disease of native coronary artery without angina pectoris: Secondary | ICD-10-CM | POA: Diagnosis not present

## 2022-01-02 DIAGNOSIS — E785 Hyperlipidemia, unspecified: Secondary | ICD-10-CM | POA: Diagnosis not present

## 2022-01-09 DIAGNOSIS — I110 Hypertensive heart disease with heart failure: Secondary | ICD-10-CM | POA: Diagnosis not present

## 2022-01-09 DIAGNOSIS — E119 Type 2 diabetes mellitus without complications: Secondary | ICD-10-CM | POA: Diagnosis not present

## 2022-01-09 DIAGNOSIS — Z87440 Personal history of urinary (tract) infections: Secondary | ICD-10-CM | POA: Diagnosis not present

## 2022-01-09 DIAGNOSIS — Z794 Long term (current) use of insulin: Secondary | ICD-10-CM | POA: Diagnosis not present

## 2022-01-09 DIAGNOSIS — I2510 Atherosclerotic heart disease of native coronary artery without angina pectoris: Secondary | ICD-10-CM | POA: Diagnosis not present

## 2022-01-09 DIAGNOSIS — R296 Repeated falls: Secondary | ICD-10-CM | POA: Diagnosis not present

## 2022-01-09 DIAGNOSIS — K219 Gastro-esophageal reflux disease without esophagitis: Secondary | ICD-10-CM | POA: Diagnosis not present

## 2022-01-09 DIAGNOSIS — Z9181 History of falling: Secondary | ICD-10-CM | POA: Diagnosis not present

## 2022-01-09 DIAGNOSIS — E785 Hyperlipidemia, unspecified: Secondary | ICD-10-CM | POA: Diagnosis not present

## 2022-01-09 DIAGNOSIS — I5032 Chronic diastolic (congestive) heart failure: Secondary | ICD-10-CM | POA: Diagnosis not present

## 2022-01-09 DIAGNOSIS — R42 Dizziness and giddiness: Secondary | ICD-10-CM | POA: Diagnosis not present

## 2022-01-10 DIAGNOSIS — Z79899 Other long term (current) drug therapy: Secondary | ICD-10-CM | POA: Diagnosis not present

## 2022-01-10 DIAGNOSIS — I700 Atherosclerosis of aorta: Secondary | ICD-10-CM | POA: Diagnosis not present

## 2022-01-10 DIAGNOSIS — L853 Xerosis cutis: Secondary | ICD-10-CM | POA: Diagnosis not present

## 2022-01-10 DIAGNOSIS — I10 Essential (primary) hypertension: Secondary | ICD-10-CM | POA: Diagnosis not present

## 2022-01-10 DIAGNOSIS — E7800 Pure hypercholesterolemia, unspecified: Secondary | ICD-10-CM | POA: Diagnosis not present

## 2022-01-10 DIAGNOSIS — E1169 Type 2 diabetes mellitus with other specified complication: Secondary | ICD-10-CM | POA: Diagnosis not present

## 2022-01-10 DIAGNOSIS — M5136 Other intervertebral disc degeneration, lumbar region: Secondary | ICD-10-CM | POA: Diagnosis not present

## 2022-01-10 DIAGNOSIS — D72829 Elevated white blood cell count, unspecified: Secondary | ICD-10-CM | POA: Diagnosis not present

## 2022-01-10 DIAGNOSIS — Z0001 Encounter for general adult medical examination with abnormal findings: Secondary | ICD-10-CM | POA: Diagnosis not present

## 2022-01-10 DIAGNOSIS — G629 Polyneuropathy, unspecified: Secondary | ICD-10-CM | POA: Diagnosis not present

## 2022-01-10 DIAGNOSIS — D649 Anemia, unspecified: Secondary | ICD-10-CM | POA: Diagnosis not present

## 2022-01-16 DIAGNOSIS — Z87440 Personal history of urinary (tract) infections: Secondary | ICD-10-CM | POA: Diagnosis not present

## 2022-01-16 DIAGNOSIS — E119 Type 2 diabetes mellitus without complications: Secondary | ICD-10-CM | POA: Diagnosis not present

## 2022-01-16 DIAGNOSIS — K219 Gastro-esophageal reflux disease without esophagitis: Secondary | ICD-10-CM | POA: Diagnosis not present

## 2022-01-16 DIAGNOSIS — Z794 Long term (current) use of insulin: Secondary | ICD-10-CM | POA: Diagnosis not present

## 2022-01-16 DIAGNOSIS — I110 Hypertensive heart disease with heart failure: Secondary | ICD-10-CM | POA: Diagnosis not present

## 2022-01-16 DIAGNOSIS — I2510 Atherosclerotic heart disease of native coronary artery without angina pectoris: Secondary | ICD-10-CM | POA: Diagnosis not present

## 2022-01-16 DIAGNOSIS — R42 Dizziness and giddiness: Secondary | ICD-10-CM | POA: Diagnosis not present

## 2022-01-16 DIAGNOSIS — E785 Hyperlipidemia, unspecified: Secondary | ICD-10-CM | POA: Diagnosis not present

## 2022-01-16 DIAGNOSIS — R296 Repeated falls: Secondary | ICD-10-CM | POA: Diagnosis not present

## 2022-01-16 DIAGNOSIS — Z9181 History of falling: Secondary | ICD-10-CM | POA: Diagnosis not present

## 2022-01-16 DIAGNOSIS — I5032 Chronic diastolic (congestive) heart failure: Secondary | ICD-10-CM | POA: Diagnosis not present

## 2022-01-25 DIAGNOSIS — E119 Type 2 diabetes mellitus without complications: Secondary | ICD-10-CM | POA: Diagnosis not present

## 2022-01-25 DIAGNOSIS — R42 Dizziness and giddiness: Secondary | ICD-10-CM | POA: Diagnosis not present

## 2022-01-25 DIAGNOSIS — E785 Hyperlipidemia, unspecified: Secondary | ICD-10-CM | POA: Diagnosis not present

## 2022-01-25 DIAGNOSIS — Z87440 Personal history of urinary (tract) infections: Secondary | ICD-10-CM | POA: Diagnosis not present

## 2022-01-25 DIAGNOSIS — Z794 Long term (current) use of insulin: Secondary | ICD-10-CM | POA: Diagnosis not present

## 2022-01-25 DIAGNOSIS — Z9181 History of falling: Secondary | ICD-10-CM | POA: Diagnosis not present

## 2022-01-25 DIAGNOSIS — K219 Gastro-esophageal reflux disease without esophagitis: Secondary | ICD-10-CM | POA: Diagnosis not present

## 2022-01-25 DIAGNOSIS — I2510 Atherosclerotic heart disease of native coronary artery without angina pectoris: Secondary | ICD-10-CM | POA: Diagnosis not present

## 2022-01-25 DIAGNOSIS — I5032 Chronic diastolic (congestive) heart failure: Secondary | ICD-10-CM | POA: Diagnosis not present

## 2022-01-25 DIAGNOSIS — R296 Repeated falls: Secondary | ICD-10-CM | POA: Diagnosis not present

## 2022-01-25 DIAGNOSIS — I110 Hypertensive heart disease with heart failure: Secondary | ICD-10-CM | POA: Diagnosis not present

## 2022-02-01 DIAGNOSIS — I110 Hypertensive heart disease with heart failure: Secondary | ICD-10-CM | POA: Diagnosis not present

## 2022-02-01 DIAGNOSIS — E785 Hyperlipidemia, unspecified: Secondary | ICD-10-CM | POA: Diagnosis not present

## 2022-02-01 DIAGNOSIS — Z794 Long term (current) use of insulin: Secondary | ICD-10-CM | POA: Diagnosis not present

## 2022-02-01 DIAGNOSIS — I2510 Atherosclerotic heart disease of native coronary artery without angina pectoris: Secondary | ICD-10-CM | POA: Diagnosis not present

## 2022-02-01 DIAGNOSIS — E119 Type 2 diabetes mellitus without complications: Secondary | ICD-10-CM | POA: Diagnosis not present

## 2022-02-01 DIAGNOSIS — Z87440 Personal history of urinary (tract) infections: Secondary | ICD-10-CM | POA: Diagnosis not present

## 2022-02-01 DIAGNOSIS — K219 Gastro-esophageal reflux disease without esophagitis: Secondary | ICD-10-CM | POA: Diagnosis not present

## 2022-02-01 DIAGNOSIS — R296 Repeated falls: Secondary | ICD-10-CM | POA: Diagnosis not present

## 2022-02-01 DIAGNOSIS — Z9181 History of falling: Secondary | ICD-10-CM | POA: Diagnosis not present

## 2022-02-01 DIAGNOSIS — I5032 Chronic diastolic (congestive) heart failure: Secondary | ICD-10-CM | POA: Diagnosis not present

## 2022-02-01 DIAGNOSIS — R42 Dizziness and giddiness: Secondary | ICD-10-CM | POA: Diagnosis not present

## 2022-02-03 DIAGNOSIS — I5032 Chronic diastolic (congestive) heart failure: Secondary | ICD-10-CM | POA: Diagnosis not present

## 2022-02-03 DIAGNOSIS — Z9181 History of falling: Secondary | ICD-10-CM | POA: Diagnosis not present

## 2022-02-03 DIAGNOSIS — E119 Type 2 diabetes mellitus without complications: Secondary | ICD-10-CM | POA: Diagnosis not present

## 2022-02-03 DIAGNOSIS — Z794 Long term (current) use of insulin: Secondary | ICD-10-CM | POA: Diagnosis not present

## 2022-02-03 DIAGNOSIS — K219 Gastro-esophageal reflux disease without esophagitis: Secondary | ICD-10-CM | POA: Diagnosis not present

## 2022-02-03 DIAGNOSIS — I110 Hypertensive heart disease with heart failure: Secondary | ICD-10-CM | POA: Diagnosis not present

## 2022-02-03 DIAGNOSIS — R42 Dizziness and giddiness: Secondary | ICD-10-CM | POA: Diagnosis not present

## 2022-02-03 DIAGNOSIS — R296 Repeated falls: Secondary | ICD-10-CM | POA: Diagnosis not present

## 2022-02-03 DIAGNOSIS — E785 Hyperlipidemia, unspecified: Secondary | ICD-10-CM | POA: Diagnosis not present

## 2022-02-03 DIAGNOSIS — I2510 Atherosclerotic heart disease of native coronary artery without angina pectoris: Secondary | ICD-10-CM | POA: Diagnosis not present

## 2022-02-05 DIAGNOSIS — M6281 Muscle weakness (generalized): Secondary | ICD-10-CM | POA: Diagnosis not present

## 2022-02-05 DIAGNOSIS — Z9181 History of falling: Secondary | ICD-10-CM | POA: Diagnosis not present

## 2022-02-05 DIAGNOSIS — I110 Hypertensive heart disease with heart failure: Secondary | ICD-10-CM | POA: Diagnosis not present

## 2022-02-05 DIAGNOSIS — R2689 Other abnormalities of gait and mobility: Secondary | ICD-10-CM | POA: Diagnosis not present

## 2022-02-07 DIAGNOSIS — R296 Repeated falls: Secondary | ICD-10-CM | POA: Diagnosis not present

## 2022-02-07 DIAGNOSIS — Z794 Long term (current) use of insulin: Secondary | ICD-10-CM | POA: Diagnosis not present

## 2022-02-07 DIAGNOSIS — I2510 Atherosclerotic heart disease of native coronary artery without angina pectoris: Secondary | ICD-10-CM | POA: Diagnosis not present

## 2022-02-07 DIAGNOSIS — Z87440 Personal history of urinary (tract) infections: Secondary | ICD-10-CM | POA: Diagnosis not present

## 2022-02-07 DIAGNOSIS — R42 Dizziness and giddiness: Secondary | ICD-10-CM | POA: Diagnosis not present

## 2022-02-07 DIAGNOSIS — Z9181 History of falling: Secondary | ICD-10-CM | POA: Diagnosis not present

## 2022-02-07 DIAGNOSIS — E119 Type 2 diabetes mellitus without complications: Secondary | ICD-10-CM | POA: Diagnosis not present

## 2022-02-07 DIAGNOSIS — I110 Hypertensive heart disease with heart failure: Secondary | ICD-10-CM | POA: Diagnosis not present

## 2022-02-07 DIAGNOSIS — K219 Gastro-esophageal reflux disease without esophagitis: Secondary | ICD-10-CM | POA: Diagnosis not present

## 2022-02-07 DIAGNOSIS — E785 Hyperlipidemia, unspecified: Secondary | ICD-10-CM | POA: Diagnosis not present

## 2022-02-07 DIAGNOSIS — I5032 Chronic diastolic (congestive) heart failure: Secondary | ICD-10-CM | POA: Diagnosis not present

## 2022-02-13 DIAGNOSIS — Z79899 Other long term (current) drug therapy: Secondary | ICD-10-CM | POA: Diagnosis not present

## 2022-02-14 DIAGNOSIS — Z794 Long term (current) use of insulin: Secondary | ICD-10-CM | POA: Diagnosis not present

## 2022-02-14 DIAGNOSIS — I110 Hypertensive heart disease with heart failure: Secondary | ICD-10-CM | POA: Diagnosis not present

## 2022-02-14 DIAGNOSIS — K219 Gastro-esophageal reflux disease without esophagitis: Secondary | ICD-10-CM | POA: Diagnosis not present

## 2022-02-14 DIAGNOSIS — Z9181 History of falling: Secondary | ICD-10-CM | POA: Diagnosis not present

## 2022-02-14 DIAGNOSIS — I2510 Atherosclerotic heart disease of native coronary artery without angina pectoris: Secondary | ICD-10-CM | POA: Diagnosis not present

## 2022-02-14 DIAGNOSIS — E119 Type 2 diabetes mellitus without complications: Secondary | ICD-10-CM | POA: Diagnosis not present

## 2022-02-14 DIAGNOSIS — R42 Dizziness and giddiness: Secondary | ICD-10-CM | POA: Diagnosis not present

## 2022-02-14 DIAGNOSIS — R296 Repeated falls: Secondary | ICD-10-CM | POA: Diagnosis not present

## 2022-02-14 DIAGNOSIS — I5032 Chronic diastolic (congestive) heart failure: Secondary | ICD-10-CM | POA: Diagnosis not present

## 2022-02-14 DIAGNOSIS — Z87440 Personal history of urinary (tract) infections: Secondary | ICD-10-CM | POA: Diagnosis not present

## 2022-02-14 DIAGNOSIS — E785 Hyperlipidemia, unspecified: Secondary | ICD-10-CM | POA: Diagnosis not present

## 2022-02-20 DIAGNOSIS — Z9181 History of falling: Secondary | ICD-10-CM | POA: Diagnosis not present

## 2022-02-20 DIAGNOSIS — M6281 Muscle weakness (generalized): Secondary | ICD-10-CM | POA: Diagnosis not present

## 2022-02-20 DIAGNOSIS — I110 Hypertensive heart disease with heart failure: Secondary | ICD-10-CM | POA: Diagnosis not present

## 2022-02-20 DIAGNOSIS — R2689 Other abnormalities of gait and mobility: Secondary | ICD-10-CM | POA: Diagnosis not present

## 2022-02-28 DIAGNOSIS — E785 Hyperlipidemia, unspecified: Secondary | ICD-10-CM | POA: Diagnosis not present

## 2022-02-28 DIAGNOSIS — R42 Dizziness and giddiness: Secondary | ICD-10-CM | POA: Diagnosis not present

## 2022-02-28 DIAGNOSIS — Z87440 Personal history of urinary (tract) infections: Secondary | ICD-10-CM | POA: Diagnosis not present

## 2022-02-28 DIAGNOSIS — Z794 Long term (current) use of insulin: Secondary | ICD-10-CM | POA: Diagnosis not present

## 2022-02-28 DIAGNOSIS — I2510 Atherosclerotic heart disease of native coronary artery without angina pectoris: Secondary | ICD-10-CM | POA: Diagnosis not present

## 2022-02-28 DIAGNOSIS — I110 Hypertensive heart disease with heart failure: Secondary | ICD-10-CM | POA: Diagnosis not present

## 2022-02-28 DIAGNOSIS — E119 Type 2 diabetes mellitus without complications: Secondary | ICD-10-CM | POA: Diagnosis not present

## 2022-02-28 DIAGNOSIS — I5032 Chronic diastolic (congestive) heart failure: Secondary | ICD-10-CM | POA: Diagnosis not present

## 2022-02-28 DIAGNOSIS — Z9181 History of falling: Secondary | ICD-10-CM | POA: Diagnosis not present

## 2022-02-28 DIAGNOSIS — R296 Repeated falls: Secondary | ICD-10-CM | POA: Diagnosis not present

## 2022-02-28 DIAGNOSIS — K219 Gastro-esophageal reflux disease without esophagitis: Secondary | ICD-10-CM | POA: Diagnosis not present

## 2022-03-18 DIAGNOSIS — Z79899 Other long term (current) drug therapy: Secondary | ICD-10-CM | POA: Diagnosis not present

## 2022-03-24 DIAGNOSIS — I499 Cardiac arrhythmia, unspecified: Secondary | ICD-10-CM | POA: Diagnosis not present

## 2022-03-24 DIAGNOSIS — R55 Syncope and collapse: Secondary | ICD-10-CM | POA: Diagnosis not present

## 2022-03-24 DIAGNOSIS — I469 Cardiac arrest, cause unspecified: Secondary | ICD-10-CM | POA: Diagnosis not present

## 2022-04-22 DIAGNOSIS — 419620001 Death: Secondary | SNOMED CT | POA: Diagnosis not present

## 2022-04-22 DEATH — deceased
